# Patient Record
Sex: Male | Born: 1955
Health system: Southern US, Community
[De-identification: ages and names within clinical notes are randomized; demographics above are authoritative.]

## PROBLEM LIST (undated history)

## (undated) DIAGNOSIS — N289 Disorder of kidney and ureter, unspecified: Secondary | ICD-10-CM

## (undated) DIAGNOSIS — Z974 Presence of external hearing-aid: Secondary | ICD-10-CM

## (undated) DIAGNOSIS — Z87442 Personal history of urinary calculi: Secondary | ICD-10-CM

## (undated) DIAGNOSIS — M545 Low back pain, unspecified: Secondary | ICD-10-CM

## (undated) DIAGNOSIS — I10 Essential (primary) hypertension: Secondary | ICD-10-CM

## (undated) DIAGNOSIS — G8929 Other chronic pain: Secondary | ICD-10-CM

## (undated) DIAGNOSIS — D649 Anemia, unspecified: Secondary | ICD-10-CM

## (undated) DIAGNOSIS — Z9289 Personal history of other medical treatment: Secondary | ICD-10-CM

## (undated) DIAGNOSIS — Z992 Dependence on renal dialysis: Secondary | ICD-10-CM

## (undated) DIAGNOSIS — F1010 Alcohol abuse, uncomplicated: Secondary | ICD-10-CM

## (undated) DIAGNOSIS — B1920 Unspecified viral hepatitis C without hepatic coma: Secondary | ICD-10-CM

## (undated) DIAGNOSIS — N186 End stage renal disease: Secondary | ICD-10-CM

## (undated) DIAGNOSIS — Z8719 Personal history of other diseases of the digestive system: Secondary | ICD-10-CM

## (undated) DIAGNOSIS — K4090 Unilateral inguinal hernia, without obstruction or gangrene, not specified as recurrent: Secondary | ICD-10-CM

## (undated) DIAGNOSIS — I739 Peripheral vascular disease, unspecified: Secondary | ICD-10-CM

## (undated) DIAGNOSIS — Z973 Presence of spectacles and contact lenses: Secondary | ICD-10-CM

## (undated) DIAGNOSIS — F1411 Cocaine abuse, in remission: Secondary | ICD-10-CM

## (undated) DIAGNOSIS — C801 Malignant (primary) neoplasm, unspecified: Secondary | ICD-10-CM

## (undated) DIAGNOSIS — I2699 Other pulmonary embolism without acute cor pulmonale: Secondary | ICD-10-CM

## (undated) HISTORY — DX: Peripheral vascular disease, unspecified: I73.9

## (undated) HISTORY — PX: EXTRACORPOREAL SHOCK WAVE LITHOTRIPSY: SHX1557

## (undated) HISTORY — DX: Anemia, unspecified: D64.9

## (undated) HISTORY — PX: ABDOMINAL HERNIA REPAIR: SHX539

## (undated) HISTORY — PX: ESOPHAGOGASTRODUODENOSCOPY: SHX1529

## (undated) HISTORY — PX: AV FISTULA REPAIR: SHX563

## (undated) HISTORY — PX: COLONOSCOPY: SHX174

## (undated) HISTORY — PX: HERNIA REPAIR: SHX51

---
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
## 1997-05-28 HISTORY — PX: CORONARY ANGIOPLASTY WITH STENT PLACEMENT: SHX49

## 1998-05-28 HISTORY — PX: ABDOMINAL HERNIA REPAIR: SHX539

## 2005-05-28 HISTORY — PX: AV FISTULA PLACEMENT: SHX1204

## 2008-05-06 DIAGNOSIS — J309 Allergic rhinitis, unspecified: Secondary | ICD-10-CM | POA: Insufficient documentation

## 2008-12-21 ENCOUNTER — Encounter: Admission: RE | Admit: 2008-12-21 | Discharge: 2008-12-21 | Payer: Self-pay | Admitting: Nephrology

## 2009-01-20 ENCOUNTER — Encounter: Admission: RE | Admit: 2009-01-20 | Discharge: 2009-01-20 | Payer: Self-pay | Admitting: Nephrology

## 2009-01-20 ENCOUNTER — Encounter (INDEPENDENT_AMBULATORY_CARE_PROVIDER_SITE_OTHER): Payer: Self-pay | Admitting: *Deleted

## 2009-02-02 ENCOUNTER — Ambulatory Visit (HOSPITAL_COMMUNITY): Admission: RE | Admit: 2009-02-02 | Discharge: 2009-02-02 | Payer: Self-pay | Admitting: Nephrology

## 2009-02-03 ENCOUNTER — Ambulatory Visit: Payer: Self-pay | Admitting: Surgery

## 2009-02-16 DIAGNOSIS — N186 End stage renal disease: Secondary | ICD-10-CM | POA: Insufficient documentation

## 2009-02-16 DIAGNOSIS — Z992 Dependence on renal dialysis: Secondary | ICD-10-CM

## 2009-02-16 HISTORY — DX: End stage renal disease: N18.6

## 2009-02-23 ENCOUNTER — Ambulatory Visit: Payer: Self-pay | Admitting: Internal Medicine

## 2009-02-23 DIAGNOSIS — I2510 Atherosclerotic heart disease of native coronary artery without angina pectoris: Secondary | ICD-10-CM | POA: Insufficient documentation

## 2009-02-23 DIAGNOSIS — K648 Other hemorrhoids: Secondary | ICD-10-CM | POA: Insufficient documentation

## 2009-02-23 DIAGNOSIS — K219 Gastro-esophageal reflux disease without esophagitis: Secondary | ICD-10-CM | POA: Insufficient documentation

## 2009-02-23 DIAGNOSIS — R1084 Generalized abdominal pain: Secondary | ICD-10-CM | POA: Insufficient documentation

## 2009-03-03 ENCOUNTER — Encounter: Payer: Self-pay | Admitting: Internal Medicine

## 2009-03-18 ENCOUNTER — Ambulatory Visit (HOSPITAL_COMMUNITY): Admission: RE | Admit: 2009-03-18 | Discharge: 2009-03-18 | Payer: Self-pay | Admitting: Internal Medicine

## 2009-03-18 ENCOUNTER — Ambulatory Visit: Payer: Self-pay | Admitting: Internal Medicine

## 2009-03-18 ENCOUNTER — Encounter: Payer: Self-pay | Admitting: Internal Medicine

## 2009-03-21 ENCOUNTER — Encounter: Payer: Self-pay | Admitting: Internal Medicine

## 2009-03-30 ENCOUNTER — Encounter
Admission: RE | Admit: 2009-03-30 | Discharge: 2009-05-19 | Payer: Self-pay | Admitting: Physical Medicine & Rehabilitation

## 2009-04-01 ENCOUNTER — Ambulatory Visit: Payer: Self-pay | Admitting: Physical Medicine & Rehabilitation

## 2009-06-06 ENCOUNTER — Telehealth: Payer: Self-pay | Admitting: Internal Medicine

## 2009-11-07 ENCOUNTER — Inpatient Hospital Stay (HOSPITAL_COMMUNITY)
Admission: EM | Admit: 2009-11-07 | Discharge: 2009-11-07 | Payer: Self-pay | Source: Home / Self Care | Admitting: Emergency Medicine

## 2010-03-04 ENCOUNTER — Emergency Department (HOSPITAL_COMMUNITY): Admission: EM | Admit: 2010-03-04 | Discharge: 2010-03-04 | Payer: Self-pay | Admitting: Emergency Medicine

## 2010-03-21 ENCOUNTER — Emergency Department (HOSPITAL_COMMUNITY): Admission: EM | Admit: 2010-03-21 | Discharge: 2010-03-21 | Payer: Self-pay | Admitting: Emergency Medicine

## 2010-04-13 ENCOUNTER — Emergency Department (HOSPITAL_COMMUNITY): Admission: EM | Admit: 2010-04-13 | Discharge: 2010-04-13 | Payer: Self-pay | Admitting: Emergency Medicine

## 2010-04-26 DIAGNOSIS — N19 Unspecified kidney failure: Secondary | ICD-10-CM | POA: Insufficient documentation

## 2010-06-08 DIAGNOSIS — G894 Chronic pain syndrome: Secondary | ICD-10-CM | POA: Insufficient documentation

## 2010-06-17 ENCOUNTER — Encounter: Payer: Self-pay | Admitting: Orthopedic Surgery

## 2010-06-18 ENCOUNTER — Encounter: Payer: Self-pay | Admitting: Physical Medicine & Rehabilitation

## 2010-06-20 ENCOUNTER — Ambulatory Visit (HOSPITAL_COMMUNITY)
Admission: RE | Admit: 2010-06-20 | Discharge: 2010-06-20 | Payer: Self-pay | Source: Home / Self Care | Attending: Nephrology | Admitting: Nephrology

## 2010-06-27 NOTE — Progress Notes (Signed)
Summary: results?  Phone Note Call from Patient Call back at [phone]   Caller: Patient Call For: Dr. Henrene Pastor Reason for Call: Lab or Test Results Summary of Call: pt want to know [facility] not [facility] the results from his ECL... says he is waiting on these results to have a transplant Initial call taken by: Lucien Mons,  June 06, 2009 10:37 AM  Follow-up for Phone Call        Explained that procedures and path. reports were sent to [facility]. immediatley post procedures who referred him here and is co-ordinating transplant work up and he should contact her office.I also told him I would be most willing to fax info. if he got me the number but it would be more advantagious to contact her as she is co-ordinating the whole pre-transplant work up to be done in [place]. Follow-up by: Abel Presto RN,  June 06, 2009 11:10 AM

## 2010-06-28 DIAGNOSIS — I2699 Other pulmonary embolism without acute cor pulmonale: Secondary | ICD-10-CM

## 2010-06-28 HISTORY — DX: Other pulmonary embolism without acute cor pulmonale: I26.99

## 2010-07-20 ENCOUNTER — Encounter (HOSPITAL_COMMUNITY): Payer: Self-pay | Admitting: Radiology

## 2010-07-20 ENCOUNTER — Emergency Department (HOSPITAL_COMMUNITY): Payer: Medicare Other

## 2010-07-20 ENCOUNTER — Inpatient Hospital Stay (HOSPITAL_COMMUNITY)
Admission: EM | Admit: 2010-07-20 | Discharge: 2010-07-27 | DRG: 175 | Disposition: A | Discharge status: Home / Self Care | Payer: Medicare Other | Attending: Internal Medicine | Admitting: Internal Medicine

## 2010-07-20 DIAGNOSIS — Z79899 Other long term (current) drug therapy: Secondary | ICD-10-CM

## 2010-07-20 DIAGNOSIS — D631 Anemia in chronic kidney disease: Secondary | ICD-10-CM | POA: Diagnosis present

## 2010-07-20 DIAGNOSIS — I120 Hypertensive chronic kidney disease with stage 5 chronic kidney disease or end stage renal disease: Secondary | ICD-10-CM | POA: Diagnosis present

## 2010-07-20 DIAGNOSIS — I2699 Other pulmonary embolism without acute cor pulmonale: Principal | ICD-10-CM | POA: Diagnosis present

## 2010-07-20 DIAGNOSIS — G894 Chronic pain syndrome: Secondary | ICD-10-CM | POA: Diagnosis present

## 2010-07-20 DIAGNOSIS — Z992 Dependence on renal dialysis: Secondary | ICD-10-CM

## 2010-07-20 DIAGNOSIS — Z531 Procedure and treatment not carried out because of patient's decision for reasons of belief and group pressure: Secondary | ICD-10-CM

## 2010-07-20 DIAGNOSIS — N186 End stage renal disease: Secondary | ICD-10-CM | POA: Diagnosis present

## 2010-07-20 DIAGNOSIS — Z7902 Long term (current) use of antithrombotics/antiplatelets: Secondary | ICD-10-CM

## 2010-07-20 DIAGNOSIS — K219 Gastro-esophageal reflux disease without esophagitis: Secondary | ICD-10-CM | POA: Diagnosis present

## 2010-07-20 DIAGNOSIS — Z9861 Coronary angioplasty status: Secondary | ICD-10-CM

## 2010-07-20 DIAGNOSIS — I252 Old myocardial infarction: Secondary | ICD-10-CM

## 2010-07-20 DIAGNOSIS — N039 Chronic nephritic syndrome with unspecified morphologic changes: Secondary | ICD-10-CM | POA: Diagnosis present

## 2010-07-20 DIAGNOSIS — E876 Hypokalemia: Secondary | ICD-10-CM | POA: Diagnosis present

## 2010-07-20 DIAGNOSIS — I2510 Atherosclerotic heart disease of native coronary artery without angina pectoris: Secondary | ICD-10-CM | POA: Diagnosis present

## 2010-07-20 DIAGNOSIS — N2581 Secondary hyperparathyroidism of renal origin: Secondary | ICD-10-CM | POA: Diagnosis present

## 2010-07-20 DIAGNOSIS — B182 Chronic viral hepatitis C: Secondary | ICD-10-CM | POA: Diagnosis present

## 2010-07-20 HISTORY — DX: Disorder of kidney and ureter, unspecified: N28.9

## 2010-07-20 LAB — CK TOTAL AND CKMB (NOT AT ARMC)
CK, MB: 1 ng/mL (ref 0.3–4.0)
Relative Index: 0.7 (ref 0.0–2.5)
Total CK: 136 U/L (ref 7–232)

## 2010-07-20 LAB — DIFFERENTIAL
Basophils Absolute: 0 10*3/uL (ref 0.0–0.1)
Eosinophils Absolute: 0.1 10*3/uL (ref 0.0–0.7)
Eosinophils Relative: 3 % (ref 0–5)
Lymphocytes Relative: 42 % (ref 12–46)
Lymphs Abs: 1.8 10*3/uL (ref 0.7–4.0)
Monocytes Absolute: 0.5 10*3/uL (ref 0.1–1.0)
Monocytes Relative: 12 % (ref 3–12)

## 2010-07-20 LAB — POCT CARDIAC MARKERS
CKMB, poc: <1 ng/mL — ABNORMAL LOW (ref 1.0–8.0)
Myoglobin, poc: 394 ng/mL (ref 12–200)
Troponin i, poc: <0.05 ng/mL (ref 0.00–0.09)

## 2010-07-20 LAB — BASIC METABOLIC PANEL
BUN: 16 mg/dL (ref 6–23)
CO2: 31 mEq/L (ref 19–32)
Calcium: 8.8 mg/dL (ref 8.4–10.5)
Chloride: 100 mEq/L (ref 96–112)
Creatinine, Ser: 7.11 mg/dL — ABNORMAL HIGH (ref 0.4–1.5)
GFR calc Af Amer: 10 mL/min — ABNORMAL LOW (ref >60)
GFR calc non Af Amer: 8 mL/min — ABNORMAL LOW (ref >60)
Glucose, Bld: 85 mg/dL (ref 70–99)
Sodium: 142 mEq/L (ref 135–145)

## 2010-07-20 LAB — CBC
HCT: 42.4 % (ref 39.0–52.0)
MCH: 31.6 pg (ref 26.0–34.0)
MCV: 95.7 fL (ref 78.0–100.0)
Platelets: 149 10*3/uL — ABNORMAL LOW (ref 150–400)
RBC: 4.43 MIL/uL (ref 4.22–5.81)
RDW: 14.6 % (ref 11.5–15.5)

## 2010-07-20 LAB — PHOSPHORUS: Phosphorus: 2.1 mg/dL — ABNORMAL LOW (ref 2.3–4.6)

## 2010-07-20 LAB — MAGNESIUM: Magnesium: 2 mg/dL (ref 1.5–2.5)

## 2010-07-20 LAB — TROPONIN I: Troponin I: 0.01 ng/mL (ref 0.00–0.06)

## 2010-07-20 LAB — PROTIME-INR: Prothrombin Time: 13.2 seconds (ref 11.6–15.2)

## 2010-07-20 MED ORDER — IOHEXOL 300 MG/ML  SOLN
100.0000 mL | Freq: Once | INTRAMUSCULAR | Status: AC | PRN
  Administered 2010-07-20: 100 mL via INTRAVENOUS

## 2010-07-21 LAB — TROPONIN I
Troponin I: 0.01 ng/mL (ref 0.00–0.06)
Troponin I: 0.02 ng/mL (ref 0.00–0.06)

## 2010-07-21 LAB — CK TOTAL AND CKMB (NOT AT ARMC)
CK, MB: 0.9 ng/mL (ref 0.3–4.0)
Relative Index: 0.6 (ref 0.0–2.5)
Relative Index: 0.7 (ref 0.0–2.5)
Total CK: 127 U/L (ref 7–232)
Total CK: 142 U/L (ref 7–232)

## 2010-07-21 LAB — BASIC METABOLIC PANEL
BUN: 23 mg/dL (ref 6–23)
CO2: 29 mEq/L (ref 19–32)
Chloride: 98 mEq/L (ref 96–112)
Creatinine, Ser: 8.82 mg/dL — ABNORMAL HIGH (ref 0.4–1.5)
GFR calc Af Amer: 8 mL/min — ABNORMAL LOW (ref >60)
Potassium: 3.5 mEq/L (ref 3.5–5.1)

## 2010-07-21 LAB — TSH: TSH: 0.985 u[IU]/mL (ref 0.350–4.500)

## 2010-07-21 LAB — CBC
HCT: 38.7 % — ABNORMAL LOW (ref 39.0–52.0)
MCH: 32 pg (ref 26.0–34.0)
MCV: 97.5 fL (ref 78.0–100.0)
Platelets: 145 10*3/uL — ABNORMAL LOW (ref 150–400)
RDW: 14.8 % (ref 11.5–15.5)

## 2010-07-21 LAB — HEPATIC FUNCTION PANEL
AST: 14 U/L (ref 0–37)
Albumin: 3.2 g/dL — ABNORMAL LOW (ref 3.5–5.2)
Total Bilirubin: 0.5 mg/dL (ref 0.3–1.2)
Total Protein: 6.4 g/dL (ref 6.0–8.3)

## 2010-07-22 ENCOUNTER — Inpatient Hospital Stay (HOSPITAL_COMMUNITY): Payer: Medicare Other

## 2010-07-22 LAB — CBC
HCT: 38.1 % — ABNORMAL LOW (ref 39.0–52.0)
Hemoglobin: 12.4 g/dL — ABNORMAL LOW (ref 13.0–17.0)
MCH: 31.5 pg (ref 26.0–34.0)
MCHC: 32.5 g/dL (ref 30.0–36.0)
MCV: 96.7 fL (ref 78.0–100.0)
RDW: 14.9 % (ref 11.5–15.5)

## 2010-07-22 LAB — RENAL FUNCTION PANEL
BUN: 31 mg/dL — ABNORMAL HIGH (ref 6–23)
CO2: 25 mEq/L (ref 19–32)
CO2: 25 mEq/L (ref 19–32)
Calcium: 7.1 mg/dL — ABNORMAL LOW (ref 8.4–10.5)
Calcium: 7.1 mg/dL — ABNORMAL LOW (ref 8.4–10.5)
Creatinine, Ser: 11.83 mg/dL — ABNORMAL HIGH (ref 0.4–1.5)
GFR calc Af Amer: 5 mL/min — ABNORMAL LOW (ref >60)
GFR calc non Af Amer: 4 mL/min — ABNORMAL LOW (ref >60)
Glucose, Bld: 84 mg/dL (ref 70–99)
Glucose, Bld: 134 mg/dL — ABNORMAL HIGH (ref 70–99)
Phosphorus: 2.6 mg/dL (ref 2.3–4.6)
Potassium: 3.7 mEq/L (ref 3.5–5.1)
Sodium: 138 mEq/L (ref 135–145)
Sodium: 136 mEq/L (ref 135–145)

## 2010-07-22 LAB — PROTIME-INR: Prothrombin Time: 20.7 seconds — ABNORMAL HIGH (ref 11.6–15.2)

## 2010-07-23 LAB — CBC
Hemoglobin: 12.6 g/dL — ABNORMAL LOW (ref 13.0–17.0)
MCH: 32 pg (ref 26.0–34.0)
MCHC: 32.9 g/dL (ref 30.0–36.0)
Platelets: 115 10*3/uL — ABNORMAL LOW (ref 150–400)
RBC: 3.94 MIL/uL — ABNORMAL LOW (ref 4.22–5.81)

## 2010-07-23 LAB — PROTIME-INR
INR: 2.7 — ABNORMAL HIGH (ref 0.00–1.49)
Prothrombin Time: 28.8 seconds — ABNORMAL HIGH (ref 11.6–15.2)

## 2010-07-23 LAB — APTT: aPTT: 92 seconds — ABNORMAL HIGH (ref 24–37)

## 2010-07-23 LAB — HOMOCYSTEINE: Homocysteine: 23.5 umol/L — ABNORMAL HIGH (ref 4.0–15.4)

## 2010-07-24 DIAGNOSIS — R0989 Other specified symptoms and signs involving the circulatory and respiratory systems: Secondary | ICD-10-CM

## 2010-07-24 LAB — APTT
aPTT: 98 seconds — ABNORMAL HIGH (ref 24–37)
aPTT: 103 seconds — ABNORMAL HIGH (ref 24–37)
aPTT: 115 seconds — ABNORMAL HIGH (ref 24–37)

## 2010-07-24 LAB — PROTIME-INR
INR: 3.11 — ABNORMAL HIGH (ref 0.00–1.49)
Prothrombin Time: 32.1 seconds — ABNORMAL HIGH (ref 11.6–15.2)

## 2010-07-24 LAB — LUPUS ANTICOAGULANT PANEL
DRVVT: 108.2 secs — ABNORMAL HIGH (ref 36.2–44.3)
Drvvt confirmation: 1.2 Ratio (ref <1.21)
PTT Lupus Anticoagulant: 96.7 secs — ABNORMAL HIGH (ref 30.0–45.6)
dRVVT Incubated 1:1 Mix: 59.3 secs — ABNORMAL HIGH (ref 36.2–44.3)

## 2010-07-24 LAB — CBC
HCT: 39.1 % (ref 39.0–52.0)
MCHC: 32.2 g/dL (ref 30.0–36.0)
MCV: 98.5 fL (ref 78.0–100.0)
Platelets: 113 10*3/uL — ABNORMAL LOW (ref 150–400)
RDW: 15 % (ref 11.5–15.5)

## 2010-07-24 LAB — ANTITHROMBIN III: AntiThromb III Func: 106 % (ref 76–126)

## 2010-07-24 LAB — FACTOR 5 LEIDEN

## 2010-07-25 ENCOUNTER — Inpatient Hospital Stay (HOSPITAL_COMMUNITY): Payer: Medicare Other

## 2010-07-25 LAB — PROTIME-INR: Prothrombin Time: 36.9 seconds — ABNORMAL HIGH (ref 11.6–15.2)

## 2010-07-25 LAB — CBC
MCH: 31.3 pg (ref 26.0–34.0)
MCHC: 32.7 g/dL (ref 30.0–36.0)
MCV: 95.7 fL (ref 78.0–100.0)
Platelets: 109 10*3/uL — ABNORMAL LOW (ref 150–400)
RBC: 3.51 MIL/uL — ABNORMAL LOW (ref 4.22–5.81)

## 2010-07-25 LAB — RENAL FUNCTION PANEL
CO2: 24 mEq/L (ref 19–32)
Chloride: 97 mEq/L (ref 96–112)
Creatinine, Ser: 14.69 mg/dL — ABNORMAL HIGH (ref 0.4–1.5)
GFR calc Af Amer: 4 mL/min — ABNORMAL LOW (ref >60)
GFR calc non Af Amer: 4 mL/min — ABNORMAL LOW (ref >60)
Glucose, Bld: 85 mg/dL (ref 70–99)
Sodium: 136 mEq/L (ref 135–145)

## 2010-07-25 LAB — APTT: aPTT: 80 seconds — ABNORMAL HIGH (ref 24–37)

## 2010-07-26 LAB — CBC
HCT: 35.1 % — ABNORMAL LOW (ref 39.0–52.0)
Hemoglobin: 11.7 g/dL — ABNORMAL LOW (ref 13.0–17.0)
MCH: 32.3 pg (ref 26.0–34.0)
MCHC: 33.3 g/dL (ref 30.0–36.0)
RDW: 15.1 % (ref 11.5–15.5)

## 2010-07-26 LAB — BETA-2-GLYCOPROTEIN I ABS, IGG/M/A
Beta-2-Glycoprotein I IgA: 10 A Units (ref <20)
Beta-2-Glycoprotein I IgM: 13 M Units (ref <20)

## 2010-07-26 LAB — CARDIOLIPIN ANTIBODIES, IGG, IGM, IGA: Anticardiolipin IgA: 14 APL U/mL (ref <22)

## 2010-07-26 NOTE — Discharge Summary (Signed)
NAME:  Logan Martin, Logan Martin                ACCOUNT NO.:  [id number]  MEDICAL RECORD NO.:  [id number]           PATIENT TYPE:  I  LOCATION:  [id number]                         FACILITY:  [facility]  PHYSICIAN:  Lottie Dawson, MD       DATE OF BIRTH:  [birth year]  DATE OF ADMISSION:  07/20/2010 DATE OF DISCHARGE:                              DISCHARGE SUMMARY   PATIENT'S PRIMARY CARE PHYSICIAN:  Does not have one.  PATIENT'S NEPHROLOGIST:  [facility].  DISCHARGE DIAGNOSES: 1. Right lower lobe pulmonary embolism. 2. End-stage renal disease. 3. History of coronary artery disease. 4. Hypertension. 5. History of gastroesophageal reflux disease. 6. History of hepatitis C. 7. Hyperkalemia. 8. Anemia of chronic disease. 9. History of secondary hyperparathyroidism. 10.History of pancreatitis. [id number] 1 diastolic dysfunction per Per 2-D echo.  DISCHARGE MEDICATIONS:  To be reconciled at the time of discharge.  BRIEF ADMITTING HISTORY AND PHYSICAL:  Logan Martin is a 55 year old African American male, who is on hemodialysis due to end-stage renal disease presented on July 20, 2010, with complaints about midsternal chest pain.  RADIOLOGY/IMAGING: 1. The patient had chest x-ray two-view on July 20, 2010, which     shows no acute disease. 2. The patient had a CT of the chest with contrast on July 20, 2010, which showed solitary right lower lobe segmental pulmonary     embolism.  Coronary calcifications noted bilateral renal and     hepatic cysts. 3. The patient had lower extremity Dopplers on July 24, 2010,     which shows no evidence of DVT or superficial thrombus involving     the right lower extremity and left lower extremity.  No evidence of     Baker cyst on right or left.  LABORATORY DATA:  CBC shows a white count of 3.0, hemoglobin 11.0, hematocrit 33.6 and platelet count 109.  Electrolytes normal with BUN of 45, creatinine is 14.69.  Lupus anticoagulant panel was done  and was positive.  Liver function tests normal except albumin is 3.3, calcium is 6.9.  Total protein was normal.  Troponin was negative x3.  TSH was 0.985  The patient had a 2-D echocardiogram on July 24, 2010, which showed left ventricle cavity size was normal, had mild concentric hypertrophy. His systolic function was normal.  Ejection fraction was 60-65%.  Wall motion was normal.  There were no regional wall motion abnormalities. Doppler parameters showed consistent with abnormal left ventricular relaxation (grade 1 diastolic dysfunction).  The patient's mitral valve showed calcified annulus.  Left atrium was mildly dilated.  HOSPITAL COURSE BY PROBLEM: 1. Right lung PE.  The patient was started on Coumadin and argatroban.     The patient was not started on heparin due to religious reasons.  A     2-D echocardiogram and lower extremity Dopplers were done.     Hypercoagulability workup was done which showed lupus     anticoagulant, was positive in the setting of acute PE Coumadin and     argatroban. The patient's INR is yet to be therapeutic off     of argatroban. 2.  End-stage renal disease on hemodialysis.  Continue as per Renal. 3. Hypertension, stable.  Continue with home medications. 4. History of hepatitis C, stable. 5. History of coronary artery disease, stable. 6. Anemia likely due to chronic disease from end-stage renal disease,     stable.  DISPOSITION:  Pending.  Once INR is greater than 4.0 on argatroban, we will discontinue argatroban and recheck his PT/INR 4 hours later off of argatroban and if the INR is greater than 2.0, consider discharging him at that time.   Lottie Dawson, MD   SR/MEDQ  D:  07/25/2010  T:  07/26/2010  Job:  [id number]  Electronically Signed by Alveta Heimlich Tionne Dayhoff  on 07/26/2010 08:05:19 AM

## 2010-07-27 ENCOUNTER — Inpatient Hospital Stay (HOSPITAL_COMMUNITY): Payer: Medicare Other

## 2010-07-27 LAB — CBC
HCT: 32.6 % — ABNORMAL LOW (ref 39.0–52.0)
Hemoglobin: 10.6 g/dL — ABNORMAL LOW (ref 13.0–17.0)
RBC: 3.44 MIL/uL — ABNORMAL LOW (ref 4.22–5.81)
RDW: 14.9 % (ref 11.5–15.5)
WBC: 2.6 10*3/uL — ABNORMAL LOW (ref 4.0–10.5)

## 2010-07-27 LAB — RENAL FUNCTION PANEL
CO2: 25 mEq/L (ref 19–32)
Chloride: 102 mEq/L (ref 96–112)
GFR calc Af Amer: 5 mL/min — ABNORMAL LOW (ref >60)
GFR calc non Af Amer: 4 mL/min — ABNORMAL LOW (ref >60)
Potassium: 4.4 mEq/L (ref 3.5–5.1)
Sodium: 137 mEq/L (ref 135–145)

## 2010-07-27 LAB — PROTIME-INR
INR: 6.72 (ref 0.00–1.49)
INR: 5.07 (ref 0.00–1.49)
Prothrombin Time: 46.7 seconds — ABNORMAL HIGH (ref 11.6–15.2)

## 2010-07-27 LAB — APTT: aPTT: 101 seconds — ABNORMAL HIGH (ref 24–37)

## 2010-07-27 LAB — PROTEIN C, TOTAL: Protein C, Total: 62 % — ABNORMAL LOW (ref 70–140)

## 2010-08-02 ENCOUNTER — Emergency Department (HOSPITAL_COMMUNITY)
Admission: EM | Admit: 2010-08-02 | Discharge: 2010-08-02 | Discharge status: Against Medical Advice | Payer: Medicare Other | Attending: Emergency Medicine | Admitting: Emergency Medicine

## 2010-08-02 DIAGNOSIS — Y849 Medical procedure, unspecified as the cause of abnormal reaction of the patient, or of later complication, without mention of misadventure at the time of the procedure: Secondary | ICD-10-CM | POA: Insufficient documentation

## 2010-08-02 DIAGNOSIS — T82898A Other specified complication of vascular prosthetic devices, implants and grafts, initial encounter: Secondary | ICD-10-CM | POA: Insufficient documentation

## 2010-08-03 NOTE — H&P (Signed)
NAME:  Logan Martin, Logan Martin                ACCOUNT NO.:  [id number]  MEDICAL RECORD NO.:  [id number]           PATIENT TYPE:  E  LOCATION:  [facility]                         FACILITY:  [facility]  PHYSICIAN:  Barbette Merino, M.D.      DATE OF BIRTH:  [birth year]  DATE OF ADMISSION:  07/20/2010 DATE OF DISCHARGE:                             HISTORY & PHYSICAL   PRIMARY CARE PHYSICIAN:  He is unassigned to [place].  PRESENTING COMPLAINT:  Chest pain and shortness of breath.  HISTORY OF PRESENT ILLNESS:  The patient is a 55 year old African American male who is on hemodialysis due to end-stage renal disease.  He has apparently been doing all right until today when he started experiencing midsternal chest pain.  He started after his hemodialysis. It was associated with some shortness of breath.  The pain is worse with deep inspiration.  He denied any nausea or diaphoresis.  He has recently had his catheter tunneled apparently.  He is very active otherwise.  No recent surgery.  He has no known cardiac disease.  PAST MEDICAL HISTORY:  Significant for, 1. End-stage renal disease, currently on hemodialysis on Tuesdays,     Thursdays, and Saturdays. 2. History of coronary artery disease. 3. GERD. 4. History of hepatitis C. 5. Hypertension. 6. Anemia of chronic disease. 7. History of secondary hyperparathyroidism. 8. History of pancreatitis.  ALLERGIES:  SULFA drugs.  CURRENT MEDICATIONS: 1. Oxycodone/acetaminophen as needed. 2. PhosLo 1 tablet t.i.d. 3. Sensipar daily. 4. Temazepam 30 mg daily. 5. Rena-Vite 1 tablet daily. 6. Omeprazole 20 mg daily. 7. Plavix 75 mg daily.  SOCIAL HISTORY:  The patient used to live in [place], but currently lives here in [place].  He is currently disabled.  Denied tobacco, alcohol, or IV drug use.  FAMILY HISTORY:  Denied any family history of any renal or cardiac disease.  REVIEW OF SYSTEMS:  All systems reviewed are negative except per HPI.  PHYSICAL  EXAMINATION:  VITAL SIGNS:  Temperature 97.5, blood pressure 114/69, with a pulse of 78, respiratory rate 18, O2 sats 99% on room air. GENERAL:  He is awake, alert.  He is in no acute distress. HEENT:  PERRL.  EOMI.  No pallor, no jaundice, no rhinorrhea. NECK:  Supple.  No JVD, no lymphadenopathy. RESPIRATORY:  He has good air entry bilaterally.  No wheezes, no rales, no crackles. CARDIOVASCULAR:  He has S1 and S2.  No audible murmur. ABDOMEN:  Soft, full, nontender with positive bowel sounds. EXTREMITIES:  No edema, cyanosis, or clubbing. SKIN:  No new rashes or ulcers.  LABORATORY DATA:  His white count is 4.3, hemoglobin 14.0 with platelet of 149, and normal differentials.  Initial cardiac enzymes are negative. Sodium is 142, potassium 3.3, chloride 100, CO2 of 31, glucose 85, BUN 16, creatinine 7.11, and calcium 8.8.  CK 136, CK-MB 1.0 with a troponin- I of 0.01.  His BNP is less than 30, magnesium 2.1, phosphorus of 2.1. Chest x-ray showed no active disease.  CT angiogram of the chest showed right lower lobe segmental pulmonary embolus with coronary calcification, bilateral renal and hepatic cysts.  ASSESSMENT:  This is a 55 year old patient with end-stage renal disease presenting with pulmonary embolism.  The patient has made it clear that he does not want any heparin or anything with pork due to religious reasons.  PLAN: 1. Right lower lobe PE.  The cause of his PE is not entirely clear.     He denied any recent surgery of the lung trauma.  It may be related     to his procedure.  There is no known family history of blood clots.     At this point, our goal is to treat his PE with Coumadin.  Since     the patient does not want to use heparin, I have conferred with     pharmacy.  We cannot use Arixtra on him because of his renal     status.  The best option is to use Arixtra in conjunction with     Coumadin.  We have to be very careful, however, because this could      consume the clotting factors and make PT/INR very difficult.  He     will need to be on that for 5 days and in the first couple of days     PT/INR timing should be very strict so that we do not get false     positive or false negative results. 2. End-stage renal disease.  We will notify Nephrology, so the patient     will be on dialysis here. 3. History of coronary artery disease.  We will cycle his enzymes,     although there is no evidence of active coronary artery disease     from his EKG and it seems like his chest pain is entirely just the     PE. 4. Hypertension.  Blood pressure is reasonably controlled.  We will     continue his home medications. 5. Gastroesophageal reflux disease.  I will resume PPIs on the     patient. 6. History of hepatitis C.  This has been worked up apparently in [place]. 7. Hypokalemia.  This will be corrected through his hemodialysis. 8. Anemia of chronic disease.  Apparently the patient has received     Epogen before, so we will defer that to Nephrology for further     treatment.     Barbette Merino, M.D.     [place]  D:  07/21/2010  T:  07/21/2010  Job:  [id number]  Electronically Signed by Barbette Merino M.D. on 08/03/2010 06:33:50 AM

## 2010-08-09 LAB — POCT I-STAT, CHEM 8
BUN: 64 mg/dL — ABNORMAL HIGH (ref 6–23)
Chloride: 103 mEq/L (ref 96–112)
Potassium: 3.7 mEq/L (ref 3.5–5.1)
Sodium: 141 mEq/L (ref 135–145)
TCO2: 28 mmol/L (ref 0–100)
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
## 2010-08-14 LAB — DIFFERENTIAL
Lymphs Abs: 1.5 10*3/uL (ref 0.7–4.0)
Monocytes Absolute: 0.4 10*3/uL (ref 0.1–1.0)
Monocytes Relative: 9 % (ref 3–12)
Neutro Abs: 2.4 10*3/uL (ref 1.7–7.7)
Neutrophils Relative %: 52 % (ref 43–77)

## 2010-08-14 LAB — CBC
HCT: 30.6 % — ABNORMAL LOW (ref 39.0–52.0)
MCV: 101.7 fL — ABNORMAL HIGH (ref 78.0–100.0)
Platelets: 199 10*3/uL (ref 150–400)
RBC: 3.01 MIL/uL — ABNORMAL LOW (ref 4.22–5.81)
RDW: 15.1 % (ref 11.5–15.5)
WBC: 4.6 10*3/uL (ref 4.0–10.5)
WBC: 4.7 10*3/uL (ref 4.0–10.5)

## 2010-08-14 LAB — RENAL FUNCTION PANEL
Albumin: 2.9 g/dL — ABNORMAL LOW (ref 3.5–5.2)
BUN: 63 mg/dL — ABNORMAL HIGH (ref 6–23)
Calcium: 7.6 mg/dL — ABNORMAL LOW (ref 8.4–10.5)
Chloride: 104 mEq/L (ref 96–112)
Creatinine, Ser: 18.84 mg/dL — ABNORMAL HIGH (ref 0.4–1.5)
GFR calc Af Amer: 3 mL/min — ABNORMAL LOW (ref >60)
Phosphorus: 4.7 mg/dL — ABNORMAL HIGH (ref 2.3–4.6)

## 2010-08-14 LAB — COMPREHENSIVE METABOLIC PANEL
AST: 17 U/L (ref 0–37)
Alkaline Phosphatase: 75 U/L (ref 39–117)
CO2: 24 mEq/L (ref 19–32)
Chloride: 103 mEq/L (ref 96–112)
Creatinine, Ser: 19.17 mg/dL — ABNORMAL HIGH (ref 0.4–1.5)
GFR calc Af Amer: 3 mL/min — ABNORMAL LOW (ref >60)

## 2010-08-14 LAB — POCT CARDIAC MARKERS
CKMB, poc: <1 ng/mL — ABNORMAL LOW (ref 1.0–8.0)
Myoglobin, poc: >500 ng/mL (ref 12–200)
Troponin i, poc: <0.05 ng/mL (ref 0.00–0.09)

## 2010-08-14 LAB — HEPATITIS B SURFACE ANTIBODY,QUALITATIVE: Hep B S Ab: POSITIVE — AB

## 2010-08-14 LAB — HEPATITIS B CORE ANTIBODY, TOTAL: Hep B Core Total Ab: NEGATIVE

## 2010-08-14 LAB — ALT: ALT: 16 U/L (ref 0–53)

## 2010-08-14 LAB — HEPATITIS B SURFACE ANTIGEN: Hepatitis B Surface Ag: NEGATIVE

## 2010-08-17 ENCOUNTER — Ambulatory Visit: Payer: Self-pay | Admitting: Gastroenterology

## 2010-10-10 NOTE — Procedures (Signed)
CAROTID DUPLEX EXAM   INDICATION:  Preop renal transplant evaluation.   HISTORY:  Diabetes:  No.  Cardiac:  No.  Hypertension:  No.  Smoking:  Previous.  Previous Surgery:  No.  CV History:  Currently asymptomatic.  Amaurosis Fugax No, Paresthesias No, Hemiparesis No                                       RIGHT             LEFT  Brachial systolic pressure:         AVF               157  Brachial Doppler waveforms:         Normal            Normal  Vertebral direction of flow:        Antegrade         Antegrade  DUPLEX VELOCITIES (cm/sec)  CCA peak systolic                   128               [id number]  ECA peak systolic                   75                58  ICA peak systolic                   57                58  ICA end diastolic                   16                17  PLAQUE MORPHOLOGY:                  Heterogeneous     Heterogeneous  PLAQUE AMOUNT:                      Minimal           Mild  PLAQUE LOCATION:                    CCA               ICA   IMPRESSION:  1. No evidence of stenosis noted in the right internal carotid artery.  2. A 1% to 39% stenosis of the left internal carotid artery.  3. A preliminary report was faxed to Dr. Sanda Klein office on      02/03/2009.   ___________________________________________  V. Leia Alf, MD   CH/MEDQ  D:  02/04/2009  T:  02/04/2009  Job:  [id number]

## 2010-11-27 ENCOUNTER — Emergency Department (HOSPITAL_COMMUNITY): Payer: Medicare Other

## 2010-11-27 ENCOUNTER — Inpatient Hospital Stay (HOSPITAL_COMMUNITY)
Admission: EM | Admit: 2010-11-27 | Discharge: 2010-11-28 | DRG: 698 | Disposition: A | Discharge status: Home / Self Care | Payer: Medicare Other | Attending: Internal Medicine | Admitting: Internal Medicine

## 2010-11-27 DIAGNOSIS — Z882 Allergy status to sulfonamides status: Secondary | ICD-10-CM

## 2010-11-27 DIAGNOSIS — N3289 Other specified disorders of bladder: Principal | ICD-10-CM | POA: Diagnosis present

## 2010-11-27 DIAGNOSIS — N2581 Secondary hyperparathyroidism of renal origin: Secondary | ICD-10-CM | POA: Diagnosis present

## 2010-11-27 DIAGNOSIS — R319 Hematuria, unspecified: Secondary | ICD-10-CM | POA: Diagnosis present

## 2010-11-27 DIAGNOSIS — Z8619 Personal history of other infectious and parasitic diseases: Secondary | ICD-10-CM

## 2010-11-27 DIAGNOSIS — I120 Hypertensive chronic kidney disease with stage 5 chronic kidney disease or end stage renal disease: Secondary | ICD-10-CM | POA: Diagnosis present

## 2010-11-27 DIAGNOSIS — N186 End stage renal disease: Secondary | ICD-10-CM | POA: Diagnosis present

## 2010-11-27 DIAGNOSIS — R339 Retention of urine, unspecified: Secondary | ICD-10-CM | POA: Diagnosis present

## 2010-11-27 DIAGNOSIS — Z86718 Personal history of other venous thrombosis and embolism: Secondary | ICD-10-CM

## 2010-11-27 DIAGNOSIS — Z7901 Long term (current) use of anticoagulants: Secondary | ICD-10-CM

## 2010-11-27 LAB — COMPREHENSIVE METABOLIC PANEL
Albumin: 3.8 g/dL (ref 3.5–5.2)
Alkaline Phosphatase: 164 U/L — ABNORMAL HIGH (ref 39–117)
BUN: 33 mg/dL — ABNORMAL HIGH (ref 6–23)
Creatinine, Ser: 10.92 mg/dL — ABNORMAL HIGH (ref 0.50–1.35)
Potassium: 4.1 mEq/L (ref 3.5–5.1)
Total Protein: 7.2 g/dL (ref 6.0–8.3)

## 2010-11-27 LAB — CBC
HCT: 27.6 % — ABNORMAL LOW (ref 39.0–52.0)
MCHC: 33.3 g/dL (ref 30.0–36.0)
Platelets: 184 10*3/uL (ref 150–400)
RDW: 15.4 % (ref 11.5–15.5)

## 2010-11-27 LAB — DIFFERENTIAL
Basophils Absolute: 0 10*3/uL (ref 0.0–0.1)
Eosinophils Absolute: 0.2 10*3/uL (ref 0.0–0.7)
Eosinophils Relative: 4 % (ref 0–5)
Lymphocytes Relative: 37 % (ref 12–46)
Monocytes Absolute: 0.5 10*3/uL (ref 0.1–1.0)

## 2010-11-27 LAB — LIPASE, BLOOD: Lipase: 30 U/L (ref 11–59)

## 2010-11-27 MED ORDER — IOHEXOL 300 MG/ML  SOLN
100.0000 mL | Freq: Once | INTRAMUSCULAR | Status: AC | PRN
  Administered 2010-11-27: 100 mL via INTRAVENOUS

## 2010-11-28 ENCOUNTER — Inpatient Hospital Stay (HOSPITAL_COMMUNITY)
Admission: RE | Admit: 2010-11-28 | Discharge: 2010-11-28 | Disposition: A | Discharge status: Home / Self Care | Payer: Medicare Other | Source: Ambulatory Visit

## 2010-11-28 LAB — CBC
Platelets: 169 10*3/uL (ref 150–400)
RDW: 15.5 % (ref 11.5–15.5)
WBC: 4 10*3/uL (ref 4.0–10.5)

## 2010-11-28 LAB — PROTIME-INR: Prothrombin Time: 21.8 seconds — ABNORMAL HIGH (ref 11.6–15.2)

## 2010-11-28 LAB — APTT: aPTT: 57 seconds — ABNORMAL HIGH (ref 24–37)

## 2010-11-28 LAB — BASIC METABOLIC PANEL
Chloride: 97 mEq/L (ref 96–112)
GFR calc Af Amer: 6 mL/min — ABNORMAL LOW (ref >60)
Potassium: 4.1 mEq/L (ref 3.5–5.1)
Sodium: 135 mEq/L (ref 135–145)

## 2010-11-28 LAB — MAGNESIUM: Magnesium: 2.1 mg/dL (ref 1.5–2.5)

## 2010-11-30 LAB — CROSSMATCH
ABO/RH(D): O POS
DAT, IgG: NEGATIVE
Unit division: 0
Unit division: 0

## 2010-11-30 LAB — URINE CULTURE

## 2010-12-04 LAB — CULTURE, BLOOD (ROUTINE X 2)
Culture  Setup Time: 201207030859
Culture: NO GROWTH

## 2010-12-05 ENCOUNTER — Other Ambulatory Visit: Payer: Self-pay | Admitting: Urology

## 2010-12-05 ENCOUNTER — Ambulatory Visit (HOSPITAL_COMMUNITY)
Admission: RE | Admit: 2010-12-05 | Discharge: 2010-12-05 | Disposition: A | Discharge status: Home / Self Care | Payer: Medicare Other | Source: Ambulatory Visit | Attending: Urology | Admitting: Urology

## 2010-12-05 ENCOUNTER — Encounter (HOSPITAL_COMMUNITY): Payer: Medicare Other

## 2010-12-05 ENCOUNTER — Other Ambulatory Visit (HOSPITAL_COMMUNITY): Payer: Medicare Other

## 2010-12-05 DIAGNOSIS — Z01818 Encounter for other preprocedural examination: Secondary | ICD-10-CM | POA: Insufficient documentation

## 2010-12-05 DIAGNOSIS — R319 Hematuria, unspecified: Secondary | ICD-10-CM | POA: Insufficient documentation

## 2010-12-05 DIAGNOSIS — Z01812 Encounter for preprocedural laboratory examination: Secondary | ICD-10-CM | POA: Insufficient documentation

## 2010-12-05 LAB — SURGICAL PCR SCREEN
MRSA, PCR: NEGATIVE
Staphylococcus aureus: NEGATIVE

## 2010-12-06 ENCOUNTER — Ambulatory Visit (HOSPITAL_COMMUNITY)
Admission: RE | Admit: 2010-12-06 | Discharge: 2010-12-06 | Disposition: A | Discharge status: Home / Self Care | Payer: Medicare Other | Source: Ambulatory Visit | Attending: Urology | Admitting: Urology

## 2010-12-06 ENCOUNTER — Other Ambulatory Visit: Payer: Self-pay | Admitting: Urology

## 2010-12-06 DIAGNOSIS — Z992 Dependence on renal dialysis: Secondary | ICD-10-CM | POA: Insufficient documentation

## 2010-12-06 DIAGNOSIS — N186 End stage renal disease: Secondary | ICD-10-CM | POA: Insufficient documentation

## 2010-12-06 DIAGNOSIS — N1330 Unspecified hydronephrosis: Secondary | ICD-10-CM | POA: Insufficient documentation

## 2010-12-06 DIAGNOSIS — B1920 Unspecified viral hepatitis C without hepatic coma: Secondary | ICD-10-CM | POA: Insufficient documentation

## 2010-12-06 DIAGNOSIS — Z01812 Encounter for preprocedural laboratory examination: Secondary | ICD-10-CM | POA: Insufficient documentation

## 2010-12-06 DIAGNOSIS — N059 Unspecified nephritic syndrome with unspecified morphologic changes: Secondary | ICD-10-CM | POA: Insufficient documentation

## 2010-12-06 DIAGNOSIS — K219 Gastro-esophageal reflux disease without esophagitis: Secondary | ICD-10-CM | POA: Insufficient documentation

## 2010-12-06 DIAGNOSIS — R310 Gross hematuria: Secondary | ICD-10-CM | POA: Insufficient documentation

## 2010-12-06 DIAGNOSIS — Z01818 Encounter for other preprocedural examination: Secondary | ICD-10-CM | POA: Insufficient documentation

## 2010-12-06 DIAGNOSIS — I2510 Atherosclerotic heart disease of native coronary artery without angina pectoris: Secondary | ICD-10-CM | POA: Insufficient documentation

## 2010-12-06 DIAGNOSIS — I120 Hypertensive chronic kidney disease with stage 5 chronic kidney disease or end stage renal disease: Secondary | ICD-10-CM | POA: Insufficient documentation

## 2010-12-06 LAB — COMPREHENSIVE METABOLIC PANEL
ALT: <5 U/L (ref 0–53)
AST: 11 U/L (ref 0–37)
CO2: 27 mEq/L (ref 19–32)
Calcium: 8.3 mg/dL — ABNORMAL LOW (ref 8.4–10.5)
Creatinine, Ser: 10.27 mg/dL — ABNORMAL HIGH (ref 0.50–1.35)
GFR calc Af Amer: 6 mL/min — ABNORMAL LOW (ref >60)
GFR calc non Af Amer: 5 mL/min — ABNORMAL LOW (ref >60)
Sodium: 138 mEq/L (ref 135–145)
Total Protein: 6.9 g/dL (ref 6.0–8.3)

## 2010-12-06 LAB — APTT: aPTT: 41 seconds — ABNORMAL HIGH (ref 24–37)

## 2010-12-06 LAB — CBC
MCV: 99.1 fL (ref 78.0–100.0)
Platelets: 158 10*3/uL (ref 150–400)
RBC: 3.18 MIL/uL — ABNORMAL LOW (ref 4.22–5.81)
RDW: 16.9 % — ABNORMAL HIGH (ref 11.5–15.5)
WBC: 4.1 10*3/uL (ref 4.0–10.5)

## 2010-12-06 LAB — PROTIME-INR: Prothrombin Time: 14.4 seconds (ref 11.6–15.2)

## 2010-12-06 NOTE — Op Note (Signed)
NAMEMarland Kitchen  Logan Martin, Logan Martin NO.:  [id number]  MEDICAL RECORD NO.:  [id number]  LOCATION:  [id number]                         FACILITY:  [facility]  PHYSICIAN:  Marshall Cork. Jeffie Pollock, M.D.    DATE OF BIRTH:  1956/01/14  DATE OF PROCEDURE:  11/27/2010 DATE OF DISCHARGE:                              OPERATIVE REPORT   PLACE OF SERVICE:  Emergency room.  PROCEDURE:  Cystoscopy with irrigation and evacuation of clots.  PREOPERATIVE DIAGNOSIS:  Urinary retention with possible traumatic catheterization.  POSTOPERATIVE DIAGNOSIS:  Urinary retention with possible traumatic catheterization with bladder filled with old blood.  SURGEON:  Marshall Cork. Jeffie Pollock, MD.  ANESTHESIA:  Local.  SPECIMEN:  Urine culture.  DRAIN:  A 20-French Council catheter.  COMPLICATIONS:  None.  INDICATIONS:  Mr. Round is a 55 year old black male with reflux nephropathy and end-stage renal disease, who has had an augmentation cystoplasty and previously managed his bladder with intermittent catheter.  He did not cath since 2007.  He presented to the emergency room today with the onset yesterday of increasing abdominal pain.  He was having nausea and vomiting when I saw him.  A CT scan revealed a markedly distended bladder.  An attempt to pass a catheter in the emergency room was felt to be traumatic and was aborted, and I was asked to see the patient.  FINDINGS OF THE PROCEDURE:  Once the patient had been clinically assessed, I felt a cystoscopy was indicated for attempted Foley placement.  He was left supine in the emergency room stretcher.  His genitalia was prepped with Betadine solution.  He was draped with sterile towels.  His urethra was instilled with 10 mL of 2% lidocaine jelly.  The 16-French flexible scope was inserted.  This examination revealed a normal urethra.  The external sphincter was intact.  The prostatic urethra had some slight ruddiness with a small clot adherent in the midportion of the  prostatic urethra.  The prostate urethra appeared more fibrotic than anything else.  The scope was inserted into the bladder, but visualization was impossible due to bloody urine.  A guidewire was passed through the scope into the bladder.  The scope was removed and a 20-French Council catheter was inserted over the wire into the bladder without difficulty.  The balloon was filled with 10 mL of sterile fluid and the wire was removed.  Immediately, there was drainage of some old motor oil-type blood from the bladder.  I then aspirated approximately 1100-1200 mL of the thick motor 41 old blood that had very little in the way of formed clots.  Once the bladder had been evacuated completely, I irrigated with a liter of saline using a Toomey syringe.  Initially, some small stringy clots were withdrawn, but eventually, the irrigant became light pink.  At this point, the catheter was placed to straight drainage and the procedure was terminated.  There were no complications.  He will need repeat cystoscopy in the OR at a later date to assess the bladder for the source of bleeding once he has been medically stabilized.     Marshall Cork. Jeffie Pollock, M.D.     JJW/MEDQ  D:  11/28/2010  T:  11/28/2010  Job:  [id number]  Electronically Signed by Irine Seal M.D. on 12/06/2010 08:40:20 AM

## 2010-12-06 NOTE — Consult Note (Signed)
NAMEMarland Kitchen  MANDY, ENGLIN NO.:  [id number]  MEDICAL RECORD NO.:  [id number]  LOCATION:  [id number]                         FACILITY:  [facility]  PHYSICIAN:  Marshall Cork. Jeffie Pollock, M.D.    DATE OF BIRTH:  1956/04/01  DATE OF CONSULTATION:  11/27/2010 DATE OF DISCHARGE:                                CONSULTATION   CHIEF COMPLAINT:  Abdominal pain.  HISTORY:  Mr. Dixon is a 55 year old black male with a complicated urologic history.  He had a neurogenic bladder with vesicoureteral reflux and was treated with an augmentation cystoplasty and had been on intermittent cath for many years.  He developed end-stage renal disease and he is currently anuric, on dialysis.  He was seen in the emergency room tonight for worsening abdominal pain that began yesterday, it is worse today.  In my presence, he was having nausea and vomiting.  He had a CT scan which showed a markedly distended bladder with left hydronephrosis which was new since 2010, but on my review of the bladder was also moderately distended in 2010.  He reports that he had cystoscopy done last year in [place] where he is originally from to evaluate some dark brown drainage.  An attempted Foley catheter placement in the emergency room, was felt to be traumatic with return of some fresh blood.  He is on Coumadin for pulmonary embolus in May 2012.  His past history is pertinent for allergies to SULFA.  He is a Muslim and refuses pork products which include heparin.  His home medications include Coumadin, omeprazole, oxycodone, PhosLo, Sensipar.  His medical history is significant as noted above. 1. He is also a coronary artery disease with stenting. 2. Hypertension. 3. Reflux. 4. Hepatitis C.  Surgical history is significant for an augmentation cystoplasty.  He had apparently peritoneal dialysis catheter, prior ventral hernia repair with mesh, A-V fistulas for his hemodialysis, and the cystoscopy noted above.  SOCIAL  HISTORY:  He denies tobacco, alcohol, or drug abuse.  He moved to [place] from [place] in the past 2 years because his daughter lives nearby.  Family history is unremarkable.  On review of systems, he has had the abdominal pain with nausea and vomiting.  He has had no fever or chills.  He had no constipation or diarrhea.  Denies lower extremity edema.  He denies chest pain or shortness of breath.  He is, otherwise, without complaints, and I filled full 12-point review of systems except as above.  PHYSICAL EXAMINATION:  VITAL SIGNS:  His temp is 98.5, pulse 84, blood pressure is 121/69, respirations 18. GENERAL:  This is a well-developed, well-nourished, somewhat small, black male in mild distress with pain and nausea. HEENT:  Normocephalic, traumatic. NECK:  Supple. LUNGS:  Clear with normal effort. HEART:  Regular rate and rhythm. ABDOMEN:  Slightly distended with moderate diffuse tenderness and fullness.  No mass, hepatosplenomegaly, or hernias noted.  He has well- healed abdominal incisions.  He has no inguinal or femoral adenopathy. GU:  A circumcised phallus with an adequate meatus.  Scrotum is unremarkable.  Testicles and epididymides are unremarkable. RECTAL:  Not performed. SKIN:  Warm and dry. NEUROLOGICAL:  He  has no focal deficits. EXTREMITIES:  Full range of motion without edema.  I have reviewed his CT films and report from today in 2010 and those findings are noted above.  LABORATORY DATA:  CBC is 4.5, hemoglobin 9.2, hematocrit 27.6, platelet count 184.  CMP had a sodium of 136, potassium 4.1, chloride of 97, CO2 of 22, BUN of 33, creatinine of 10.92, and a glucose of 75.  His AST is 11 and his lipase was 30.  I did perform a cystoscopy with evacuation of old blood that will be dictated separately.  IMPRESSION: 1. Abdominal pain and nausea and anuric and with history of an     augmentation cystoplasty and markedly dilated bladder with left      hydronephrosis on CT scan. 2. 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 3. Recent TEE, now on Coumadin. 4. He has no fever or leukocytosis.  The material removed from the     bladder is not malodorous.  I doubt active urinary tract infection     or infection. 5. The 1100 mL of blood that were removed from the bladder were     liquefied and old and it could have been there quite chronically;     however, that it is difficult to determine exactly when the     bleeding may have occurred.  PLAN: 1. He needs to be admitted for observation overnight, but due to his     complicated nature that it should be done by the Medical Service. 2. A urine culture was sent. 3. The Foley catheter was left indwelling and it will need to be     irrigated with normal saline 30-60 mL every 4 hours overnight. 4. We will check a PT and a PTT to make sure he is not out of     therapeutic range. 5. We will need repeat cystoscopy in the OR at some point to assess     the source of the bleeding.    Marshall Cork. Jeffie Pollock, M.D.    JJW/MEDQ  D:  11/28/2010  T:  11/28/2010  Job:  [id number]  cc:   Caren Griffins B. Lorrene Reid, M.D.  Electronically Signed by Irine Seal M.D. on 12/06/2010 08:40:18 AM

## 2010-12-07 ENCOUNTER — Ambulatory Visit: Payer: Medicare Other | Admitting: Gastroenterology

## 2010-12-08 NOTE — Op Note (Signed)
NAME:  AVI, TROTTI NO.:  [id number]  MEDICAL RECORD NO.:  [id number]  LOCATION:  DAYL                         FACILITY:  [facility]  PHYSICIAN:  Marshall Cork. Jeffie Pollock, M.D.    DATE OF BIRTH:  1955/05/30  DATE OF PROCEDURE:  12/06/2010 DATE OF DISCHARGE:                              OPERATIVE REPORT   PROCEDURE:  Cystoscopy with bilateral retrograde pyelograms with interpretation, bladder irrigation and clot evacuation.  PREOPERATIVE DIAGNOSIS:  Gross hematuria and hydronephrosis.  POSTOPERATIVE DIAGNOSIS:  Gross hematuria and hydronephrosis.  SURGEON:  Marshall Cork. Jeffie Pollock, M.D.  ANESTHESIA:  General.  SPECIMEN:  Bladder cytologies.  DRAINS:  None.  COMPLICATIONS:  None.  INDICATIONS:  [name] is a 55 year old African-American male with reflux, nephropathy and end-stage renal disease, who was seen in the emergency room recently with bladder completely full of liquified blood, approximately 1100 mL were removed.  He had formally been on a new cath but had no urine output for quite some time.  Did not cath since 2007. He had had a cystoscopy in 2010 in [place].  It was felt that cystoscopy and bilateral retrograde pyelograms were indicated to assess for neoplastic changes in his bladder which had been augmented in the past and retrograde pyelograms performed as well for further evaluation of his upper tracts.  He was given Cipro.  He was taken to the operating room where general anesthetic was induced.  Cystoscopy was performed using a 22-French scope and 12 and 70 degrees lenses, also a 28-French continuous flow resectoscope sheath was used to aid irrigation.  On initial inspection, the patient was noted to have a normal urethra.  The external sphincter was intact.  The prostatic urethra was short with some cavitary changes in the distal prostatic urethra but bilobar hyperplasia more proximally. The mucosa of the of the native bladder had appearance consistent  with diffuse cystitis glandularis but no papillary tumors or other changes suspicious for neoplasm were noted.  Ureteral orifices were noted.  They were wide-mouth consistent with reflux nephropathy.  During the initial inspection, there was still some of the old liquified blood in the bladder and this was evacuated through multiple irrigation.  Inspection of the mucosa of the augmentation section revealed unremarkable gastrointestinal mucosa without evidence of neoplastic changes and obvious source of bleeding.  Once thorough irrigation and inspection had been performed, saline was used to obtain a barbotage cytology which will be sent to the pathology lab.  He then underwent retrograde pyelography.  Initially, the right orifice was cannulated with an open-end catheter and contrast was instilled. This revealed a somewhat tortuous but nondilated ureter to the kidney. There were no intrarenal filling defects or hydronephrosis, but there were some calyces that were distorted from the patient's known renal cyst.  The left ureteral orifice was then cannulated and Albarran bridge with a 70 degrees lens was required to obtain a proper angle.  Contrast was instilled.  The left retrograde pyelogram revealed marked J hooking of the distal ureter but an unremarkable ureter above that with no filling defects or hydronephrosis noted in the internal collecting system.  At this point, final inspection of bladder revealed no  active bleeding points and nothing suspicious for neoplasm that felt required biopsy. The bladder was then completely drained.  The scope was removed.  He was taken down from lithotomy position.  His anesthetic was reversed and he was moved to the recovery room in stable condition.  There were no complications.     Marshall Cork. Jeffie Pollock, M.D.     JJW/MEDQ  D:  12/06/2010  T:  12/06/2010  Job:  [id number]  cc:   Caren Griffins B. Lorrene Reid, M.D. Fax: [phone]  Electronically Signed by  Irine Seal M.D. on 12/08/2010 02:25:49 PM

## 2010-12-18 NOTE — H&P (Signed)
NAME:  Logan Martin, Logan Martin NO.:  [id number]  MEDICAL RECORD NO.:  [id number]  LOCATION:                                 FACILITY:  PHYSICIAN:  Jana Hakim, M.D. DATE OF BIRTH:  1956/04/29  DATE OF ADMISSION:  11/28/2010 DATE OF DISCHARGE:                             HISTORY & PHYSICAL   CHIEF COMPLAINT:  Abdominal pain, nausea, and urinary retention.  This is a 55 year old African American gentleman with previous history of end-stage renal disease, hepatitis C, anemia of chronic disease, hypertension, and pulmonary embolism, on Coumadin therapy, who presented to the emergency department of [facility] with the above complaints.  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  There appears to be a new UVJ with mild-to- moderate left hydronephrosis probably related to stricture or narrowing as it tie in with the augmented bladder.  The emergency department attempt to catheterize, the bladder failed, and it was noted that the patient had some bloody discharge.  Dr. Jeffie Pollock urologist was notified and he performed a cystoscopy with evacuation of large amount of nearly 1200 mL of liquefied blood.  PAST MEDICAL HISTORY:  Significant for pulmonary embolism and the patient is on Coumadin therapy.  He also has a history of CAD, end-stage renal disease, on hemodialysis, three times a week on Tuesday, Thursday, and Saturday.  He has a history of GERD, hepatitis C, hypertension, anemia of chronic disease, hyperparathyroidism that is secondary, and history of pancreatitis.  FAMILY HISTORY:  Noncontributory to this admission.  SOCIAL HISTORY:  The patient moved to the [place] from [place] in 2009.  Has two daughters.  He quit smoking, drinking, and using all illicit drugs in [id number] and went to the rehab therapy.  ALLERGIES:  He is allergic to SULFA, and  his food allergy - he is allergic to PORK.  CODE STATUS:  He is a full code.  HOME MEDICATIONS:  He is on Coumadin, omeprazole, oxycodone/acetaminophen, PhosLo, Sensipar.  REVIEW OF SYSTEMS:  He complained of abdominal pain and nausea with heaves, also inability to urinate and occasionally experiences indigestion, has dry skin.  Otherwise, he denied other complaints.  The patient denied chest pain, shortness of breath, dizziness, lightheadedness.  He denied any constipation.  Michela Pitcher that he makes a little bit of urine and that was new for him that he was unable to urinate.  Denied any myalgia or arthralgia.  PHYSICAL EXAM:  VITAL SIGNS:  Revealed blood pressure of 121/69 on admission, later blood pressure 166/84, heart rate 88, respirations 20, pulse oximetry 100% on room air, temperature 98.2. HEENT:  Normocephalic, atraumatic.  Mucous membranes somewhat dry. Sclerae slightly icteric. NECK:  Without any JVD or carotid bruits. LUNGS:  Without rales, rhonchi, or wheezes.  Clear to auscultation bilaterally. HEART:  Regular rate and rhythm with [id number] systolic murmur at the left sternal border. ABDOMEN:  Mildly distended and tender to palpation throughout.  Anterior abdominal wall has a scar after a ventral hernia repair and it is possible to palpate loops of the intestine sliding to the incisional hernia.  Liver and spleen impalpable.  No masses appreciated. EXTREMITIES:  Lower extremities without any signs of edema.  Pulses of the upper and lower extremities are palpable and 1+. SKIN:  Dry, but intact without any lesions or sores. NEURO:  Grossly intact.  There are no focal abnormalities.  LABORATORY:  His white blood cell count was 4.5, hemoglobin 9.2, hematocrit 27.6, platelet count 184.  Sodium 136, potassium 4.1, chloride 97, carbon dioxide 22, glucose 75, BUN 33, creatinine 10.92, calcium 9.0, total protein 7.2, albumin 3.8, AST 11, ALT 5, alkaline phosphatase 164, bilirubin  total 0.2.  Pro-time 21.8, INR 1.86, PTT 57.  Abdominal CT results as described to the beginning of dictation.  IMPRESSION AND PLAN: 1. Abdominal pain with nausea - somewhat improved after the urinary     procedure, antiemetics and pain killers were given. 2. Bladder retention with 1200 mL liquified blood, status post     cystoscopy with evacuation of the hemorrhagic content. 3. Pulmonary embolism - the patient is on Coumadin.  We will hold it     for now and defer the decision to the rounding physician whether to     restart or continue to hold.  The case might need to be discussed     with Dr. Jeffie Pollock in regards to restarting Coumadin therapy. 4. End-stage renal disease.  We will notify Renal Service about the     patient's admission, so they could put him on hemodialysis list     while in the hospital.  Next, this was recommended by Dr. Jeffie Pollock.     The patient's bladder needs to be irrigated every 4 hours during     the night of admission and eventually the patient will need to     undergo another cystoscopy in the OR to assess the source of     bleeding.     York Grice, P.A.   ______________________________ Jana Hakim, M.D.    MK/MEDQ  D:  11/28/2010  T:  11/28/2010  Job:  [id number]  Electronically Signed by York Grice P.A. on 11/28/2010 07:53:36 PM Electronically Signed by Jana Hakim M.D. on 12/18/2010 11:52:43 AM

## 2010-12-21 NOTE — Discharge Summary (Signed)
  NAME:  Logan Martin, Logan Martin NO.:  [id number]  MEDICAL RECORD NO.:  [id number]  LOCATION:  DIALYSI                      FACILITY:  [facility]  PHYSICIAN:  Leana Gamer, MDDATE OF BIRTH:  1956/04/01  DATE OF ADMISSION:  11/28/2010 DATE OF DISCHARGE:  11/28/2010                              DISCHARGE SUMMARY   The patient left against medical advice on November 28, 2010.  DIAGNOSES:  At the time of discharge include the following; 1. Hematuria, resolved. 2. Abdominal pain. 3. Pulmonary embolism. 4. End-stage renal disease.  DISCHARGE MEDICATIONS:  None.  HOSPITAL COURSE:  Please see the dictated H and P by Dr. Arnoldo Morale for discharge summary.  However, this is a gentleman with end-stage renal disease, hepatitis C, history of pulmonary embolism on Coumadin therapy, who is admitted to the hospital.  He came to the hospital after he had urinary retention.  The attempts were made to place a catheter in the emergency room and is stable.  The patient had bloody discharge.  Dr. Louie Casa, urologist notify and perform the cystoscopy with evacuation of large amounts of nearly 1200 mL of liquified blood and clots.  The patient subsequently went to dialysis as scheduled and received his dialysis.  Upon return from dialysis, the patient demanded to leave immediately.  It was discussed with the patient that it was imperative that he remain hospitalized, so that we restarted his Coumadin to ensure that he had no further bleeding from the urinary tract.  The patient actually refused stating that he had to go because his daughter was in the emergency room.  I came to the floor and spoke to the patient and even offered him the opportunity to go down to the emergency room to see his daughter, which he initially agreed upon and then come back up for continuous treatment.  The patient then made a decision that he had to leave and left.  The patient signed the papers for against  medical advice and left without any medications.     Leana Gamer, MD     MAM/MEDQ  D:  12/14/2010  T:  12/14/2010  Job:  [id number]  Electronically Signed by Liston Alba MD on 12/21/2010 10:33:57 AM

## 2011-11-16 ENCOUNTER — Other Ambulatory Visit (HOSPITAL_COMMUNITY): Payer: Self-pay | Admitting: Nephrology

## 2011-11-16 DIAGNOSIS — N186 End stage renal disease: Secondary | ICD-10-CM

## 2011-11-19 ENCOUNTER — Other Ambulatory Visit (HOSPITAL_COMMUNITY): Payer: Self-pay | Admitting: Nephrology

## 2011-11-19 ENCOUNTER — Ambulatory Visit (HOSPITAL_COMMUNITY)
Admission: RE | Admit: 2011-11-19 | Discharge: 2011-11-19 | Disposition: A | Discharge status: Home / Self Care | Payer: Medicare Other | Source: Ambulatory Visit | Attending: Nephrology | Admitting: Nephrology

## 2011-11-19 DIAGNOSIS — N186 End stage renal disease: Secondary | ICD-10-CM

## 2011-11-19 DIAGNOSIS — T82898A Other specified complication of vascular prosthetic devices, implants and grafts, initial encounter: Secondary | ICD-10-CM | POA: Insufficient documentation

## 2011-11-19 DIAGNOSIS — Y849 Medical procedure, unspecified as the cause of abnormal reaction of the patient, or of later complication, without mention of misadventure at the time of the procedure: Secondary | ICD-10-CM | POA: Insufficient documentation

## 2011-11-19 MED ORDER — IOHEXOL 300 MG/ML  SOLN
100.0000 mL | Freq: Once | INTRAMUSCULAR | Status: AC | PRN
  Administered 2011-11-19: 24 mL via INTRAVENOUS

## 2011-11-19 NOTE — Procedures (Signed)
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  Patient will return later this week for intervention with sedation.  No immediate complications.

## 2011-11-22 ENCOUNTER — Encounter (HOSPITAL_COMMUNITY): Payer: Self-pay | Admitting: Pharmacy Technician

## 2011-11-23 ENCOUNTER — Ambulatory Visit (HOSPITAL_COMMUNITY)
Admission: RE | Admit: 2011-11-23 | Discharge: 2011-11-23 | Disposition: A | Discharge status: Home / Self Care | Payer: Medicare Other | Source: Ambulatory Visit | Attending: Nephrology | Admitting: Nephrology

## 2011-11-23 ENCOUNTER — Other Ambulatory Visit (HOSPITAL_COMMUNITY): Payer: Self-pay | Admitting: Nephrology

## 2011-11-23 ENCOUNTER — Encounter (HOSPITAL_COMMUNITY): Payer: Self-pay

## 2011-11-23 DIAGNOSIS — N186 End stage renal disease: Secondary | ICD-10-CM

## 2011-11-23 DIAGNOSIS — I871 Compression of vein: Secondary | ICD-10-CM | POA: Insufficient documentation

## 2011-11-23 DIAGNOSIS — T82898A Other specified complication of vascular prosthetic devices, implants and grafts, initial encounter: Secondary | ICD-10-CM | POA: Insufficient documentation

## 2011-11-23 DIAGNOSIS — Z992 Dependence on renal dialysis: Secondary | ICD-10-CM | POA: Insufficient documentation

## 2011-11-23 DIAGNOSIS — Y832 Surgical operation with anastomosis, bypass or graft as the cause of abnormal reaction of the patient, or of later complication, without mention of misadventure at the time of the procedure: Secondary | ICD-10-CM | POA: Insufficient documentation

## 2011-11-23 MED ORDER — FENTANYL CITRATE 0.05 MG/ML IJ SOLN
INTRAMUSCULAR | Status: AC
  Filled 2011-11-23: qty 4

## 2011-11-23 MED ORDER — MIDAZOLAM HCL 2 MG/2ML IJ SOLN
INTRAMUSCULAR | Status: AC
  Filled 2011-11-23: qty 4

## 2011-11-23 MED ORDER — FENTANYL CITRATE 0.05 MG/ML IJ SOLN
INTRAMUSCULAR | Status: AC | PRN
  Administered 2011-11-23 (×3): 50 ug via INTRAVENOUS

## 2011-11-23 MED ORDER — MIDAZOLAM HCL 5 MG/5ML IJ SOLN
INTRAMUSCULAR | Status: AC | PRN
  Administered 2011-11-23 (×3): 1 mg via INTRAVENOUS

## 2011-11-23 MED ORDER — IOHEXOL 300 MG/ML  SOLN
100.0000 mL | Freq: Once | INTRAMUSCULAR | Status: AC | PRN
  Administered 2011-11-23: 20 mL via INTRAVENOUS

## 2011-11-23 NOTE — H&P (Signed)
Logan Martin is an 56 y.o. male.   Chief Complaint: Rt arm dialysis graft studied 6/24: Rt subclavian vein stenosis Pt returns today for angioplasty/stnet to be performed using sedation HPI: ESRD  Past Medical History  Diagnosis Date  . Renal insufficiency   . Hx of bladder repair surgery     No past surgical history on file.  No family history on file. Social History:  does not have a smoking history on file. He does not have any smokeless tobacco history on file. His alcohol and drug histories not on file.  Allergies:  Allergies  Allergen Reactions  . Heparin   . Sulfa Antibiotics   . Sulfonamide Derivatives      (Not in a hospital admission)  No results found for this or any previous visit (from the past 48 hour(s)). No results found.  Review of Systems  Constitutional: Negative for fever and chills.  Respiratory: Negative for cough.   Gastrointestinal: Negative for nausea and vomiting.  Neurological: Negative for dizziness and headaches.    Blood pressure 147/54, pulse 78, temperature 97.8 F (36.6 C), temperature source Oral, resp. rate 13. Physical Exam  Constitutional: He is oriented to person, place, and time. He appears well-developed and well-nourished.  Cardiovascular: Normal rate, regular rhythm and normal heart sounds.   No murmur heard. Respiratory: Effort normal and breath sounds normal. He has no wheezes.  GI: Soft. Bowel sounds are normal. There is no tenderness.  Musculoskeletal: Normal range of motion.       Rt arm dialysis graft; last used 6/27  Neurological: He is alert and oriented to person, place, and time.  Skin: Skin is warm and dry.  Psychiatric: He has a normal mood and affect. His behavior is normal. Judgment and thought content normal.     Assessment/Plan Rt arm graft shuntogram 6/24: rt subclavian vein stenosis- Dr. Pascal Lux Pt has returned for treatment with sedation Pt aware of procedure benefits and risks and agreeable to  proceed. Consent in chart  Esequiel Kleinfelter A 11/23/2011, 7:59 AM

## 2011-11-23 NOTE — Procedures (Signed)
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 See complete dictation in [facility].

## 2011-11-23 NOTE — Discharge Instructions (Addendum)
Moderate Sedation, Adult Moderate sedation is given to help you relax or even sleep through a procedure. You may remain sleepy, be clumsy, or have poor balance for several hours following this procedure. Arrange for a responsible adult, family member, or friend to take you home. A responsible adult should stay with you for at least 24 hours or until the medicines have worn off.  Do not participate in any activities where you could become injured for the next 24 hours, or until you feel normal again. Do not:   Drive.   Swim.   Ride a bicycle.   Operate heavy machinery.   Cook.   Use power tools.   Climb ladders.   Work at [employer].   Do not make important decisions or sign legal documents until you are improved.   Vomiting may occur if you eat too soon. When you can drink without vomiting, try water, juice, or soup. Try solid foods if you feel little or no nausea.   Only take over-the-counter or prescription medications for pain, discomfort, or fever as directed by your caregiver.If pain medications have been prescribed for you, ask your caregiver how soon it is safe to take them.   Make sure you and your family fully understands everything about the medication given to you. Make sure you understand what side effects may occur.   You should not drink alcohol, take sleeping pills, or medications that cause drowsiness for at least 24 hours.   If you smoke, do not smoke alone.   If you are feeling better, you may resume normal activities 24 hours after receiving sedation.   Keep all appointments as scheduled. Follow all instructions.   Ask questions if you do not understand.  SEEK MEDICAL CARE IF:   Your skin is pale or bluish in color.   You continue to feel sick to your stomach (nauseous) or throw up (vomit).   Your pain is getting worse and not helped by medication.   You have bleeding or swelling.   You are still sleepy or feeling clumsy after 24 hours.  SEEK IMMEDIATE  MEDICAL CARE IF:   You develop a rash.   You have difficulty breathing.   You develop any type of allergic problem.   You have a fever.  Document Released: 02/06/2001 Document Revised: 05/03/2011 Document Reviewed: 06/30/2007 [facility] Patient Information 2012 [place].  PATIENT TO RETURN TO RADIOLOGY DEPARTMENT Saturday June 29 th between 8:00 AM and 12:00 Noon to have suture removed from graft site

## 2011-11-23 NOTE — ED Notes (Signed)
Discharge instructions reviewed with pt and his brother.  Pt to follow up with radiology dept tomorrow between 0800 and 1200 for suture removal from angio site.  Comprehension and willingness to comply verbalized.  Questions denied.  Pt discharged home

## 2012-02-04 DIAGNOSIS — I2510 Atherosclerotic heart disease of native coronary artery without angina pectoris: Secondary | ICD-10-CM | POA: Insufficient documentation

## 2012-02-04 DIAGNOSIS — I2699 Other pulmonary embolism without acute cor pulmonale: Secondary | ICD-10-CM | POA: Insufficient documentation

## 2012-02-04 DIAGNOSIS — D631 Anemia in chronic kidney disease: Secondary | ICD-10-CM | POA: Insufficient documentation

## 2012-02-04 DIAGNOSIS — N189 Chronic kidney disease, unspecified: Secondary | ICD-10-CM | POA: Insufficient documentation

## 2012-02-04 DIAGNOSIS — N2581 Secondary hyperparathyroidism of renal origin: Secondary | ICD-10-CM | POA: Insufficient documentation

## 2012-02-04 DIAGNOSIS — B182 Chronic viral hepatitis C: Secondary | ICD-10-CM | POA: Insufficient documentation

## 2012-03-10 ENCOUNTER — Emergency Department (HOSPITAL_COMMUNITY): Payer: Medicare Other

## 2012-03-10 ENCOUNTER — Encounter (HOSPITAL_COMMUNITY): Payer: Self-pay | Admitting: *Deleted

## 2012-03-10 ENCOUNTER — Emergency Department (HOSPITAL_COMMUNITY)
Admission: EM | Admit: 2012-03-10 | Discharge: 2012-03-10 | Disposition: A | Discharge status: Home / Self Care | Payer: Medicare Other | Attending: Emergency Medicine | Admitting: Emergency Medicine

## 2012-03-10 DIAGNOSIS — R10815 Periumbilic abdominal tenderness: Secondary | ICD-10-CM | POA: Insufficient documentation

## 2012-03-10 DIAGNOSIS — Z9889 Other specified postprocedural states: Secondary | ICD-10-CM | POA: Insufficient documentation

## 2012-03-10 DIAGNOSIS — N189 Chronic kidney disease, unspecified: Secondary | ICD-10-CM | POA: Insufficient documentation

## 2012-03-10 DIAGNOSIS — R109 Unspecified abdominal pain: Secondary | ICD-10-CM | POA: Insufficient documentation

## 2012-03-10 DIAGNOSIS — R110 Nausea: Secondary | ICD-10-CM | POA: Insufficient documentation

## 2012-03-10 DIAGNOSIS — Q618 Other cystic kidney diseases: Secondary | ICD-10-CM | POA: Insufficient documentation

## 2012-03-10 DIAGNOSIS — Z992 Dependence on renal dialysis: Secondary | ICD-10-CM | POA: Insufficient documentation

## 2012-03-10 HISTORY — DX: Unspecified viral hepatitis C without hepatic coma: B19.20

## 2012-03-10 LAB — CBC WITH DIFFERENTIAL/PLATELET
Basophils Absolute: 0 10*3/uL (ref 0.0–0.1)
Basophils Relative: 1 % (ref 0–1)
Eosinophils Absolute: 0.3 10*3/uL (ref 0.0–0.7)
Eosinophils Relative: 6 % — ABNORMAL HIGH (ref 0–5)
HCT: 38.5 % — ABNORMAL LOW (ref 39.0–52.0)
Hemoglobin: 12.4 g/dL — ABNORMAL LOW (ref 13.0–17.0)
Lymphocytes Relative: 30 % (ref 12–46)
Lymphs Abs: 1.3 10*3/uL (ref 0.7–4.0)
MCH: 30.8 pg (ref 26.0–34.0)
MCHC: 32.2 g/dL (ref 30.0–36.0)
MCV: 95.8 fL (ref 78.0–100.0)
Monocytes Absolute: 0.5 10*3/uL (ref 0.1–1.0)
Monocytes Relative: 12 % (ref 3–12)
Neutro Abs: 2.2 10*3/uL (ref 1.7–7.7)
Neutrophils Relative %: 52 % (ref 43–77)
Platelets: 181 10*3/uL (ref 150–400)
RBC: 4.02 MIL/uL — ABNORMAL LOW (ref 4.22–5.81)
RDW: 17.7 % — ABNORMAL HIGH (ref 11.5–15.5)
WBC: 4.3 10*3/uL (ref 4.0–10.5)

## 2012-03-10 LAB — COMPREHENSIVE METABOLIC PANEL
ALT: 5 U/L (ref 0–53)
AST: 14 U/L (ref 0–37)
Albumin: 3.9 g/dL (ref 3.5–5.2)
Alkaline Phosphatase: 125 U/L — ABNORMAL HIGH (ref 39–117)
BUN: 30 mg/dL — ABNORMAL HIGH (ref 6–23)
CO2: 33 mEq/L — ABNORMAL HIGH (ref 19–32)
Calcium: 8.5 mg/dL (ref 8.4–10.5)
Chloride: 92 mEq/L — ABNORMAL LOW (ref 96–112)
Creatinine, Ser: 8.67 mg/dL — ABNORMAL HIGH (ref 0.50–1.35)
GFR calc Af Amer: 7 mL/min — ABNORMAL LOW (ref >90)
GFR calc non Af Amer: 6 mL/min — ABNORMAL LOW (ref >90)
Glucose, Bld: 90 mg/dL (ref 70–99)
Potassium: 3.5 mEq/L (ref 3.5–5.1)
Sodium: 137 mEq/L (ref 135–145)
Total Bilirubin: 0.3 mg/dL (ref 0.3–1.2)
Total Protein: 7.7 g/dL (ref 6.0–8.3)

## 2012-03-10 LAB — LIPASE, BLOOD: Lipase: 36 U/L (ref 11–59)

## 2012-03-10 MED ORDER — IOHEXOL 300 MG/ML  SOLN
100.0000 mL | Freq: Once | INTRAMUSCULAR | Status: AC | PRN
  Administered 2012-03-10: 100 mL via INTRAVENOUS

## 2012-03-10 MED ORDER — IOHEXOL 300 MG/ML  SOLN
20.0000 mL | INTRAMUSCULAR | Status: AC
  Administered 2012-03-10 (×2): 20 mL via ORAL

## 2012-03-10 NOTE — ED Notes (Addendum)
Pt resting in bed using IPAD. No distress noted

## 2012-03-10 NOTE — ED Provider Notes (Signed)
CT results reviewed and discussed with patient.  No indication of acute inflammatory process noted. Patient reports pain is currently controlled.  Patient states that he has pain medication at home.  Abdomen soft, bowel sounds present.  Patient has an appointment scheduled with Dr. Lorrene Reid on October 26.  Norman Herrlich, NP 03/10/12 1623

## 2012-03-10 NOTE — ED Provider Notes (Signed)
History     CSN: [id number]  Arrival date & time 03/10/12  1028   First MD Initiated Contact with Patient 03/10/12 1050      Chief Complaint  Patient presents with  . Abdominal Pain    (Consider location/radiation/quality/duration/timing/severity/associated sxs/prior treatment) HPI The patient presents to the emergency department with abdominal pain.  He states the pain started this morning in bed.  It is a sharp and "pinching" periumbilical pain that is [id number] at it's worst.  The patient states the pain is worse when he is laying flat or standing up straight.  The patient states a history of peritoneal dialysis and hernias that resulted in abdominal mesh surgery in 2001.  Since then, he has been on hemodialysis and has had no abdominal pain issues.  He reports nausea with the pain.  He denies vomiting, fevers, change in bowels, urinary changes, radiating pain, chest pain, sob, dizziness, and weakness.     Past Medical History  Diagnosis Date  . Renal insufficiency   . Hx of bladder repair surgery   . Hepatitis C     Past Surgical History  Procedure Date  . Hernia repair     No family history on file.  History  Substance Use Topics  . Smoking status: Former Research scientist (life sciences)  . Smokeless tobacco: Not on file  . Alcohol Use: No      Review of Systems All pertinent positives and negatives in the history of present illness   Allergies  Heparin; Sulfa antibiotics; and Sulfonamide derivatives  Home Medications   Current Outpatient Rx  Name Route Sig Dispense Refill  . CALCIUM ACETATE 667 MG PO CAPS Oral Take 2,001 mg by mouth 3 (three) times daily with meals.    [name] CINACALCET HCL 60 MG PO TABS Oral Take 120 mg by mouth daily.    . EPOGEN IJ Injection Inject 0.63 mLs as directed 2 (two) times a week. Inject on Tuesdays and Saturdays.    . ADULT MULTIVITAMIN W/MINERALS CH Oral Take 1 tablet by mouth daily.    [name] RENA-VITE PO TABS Oral Take 1 tablet by mouth daily.    . OXYCODONE  HCL 30 MG PO TABS Oral Take 30 mg by mouth 3 (three) times daily.    [name] ZEMPLAR IV Intravenous Inject 0.8 mLs into the vein 3 (three) times a week. Inject on Tuesdays, Wednesdays, and Thursdays.      BP 147/83  Pulse 86  Temp 97.9 F (36.6 C) (Oral)  Resp 18  SpO2 98%  Physical Exam  Constitutional: He is oriented to person, place, and time. He appears well-developed and well-nourished.  HENT:  Head: Normocephalic and atraumatic.  Eyes: Conjunctivae normal and EOM are normal. Pupils are equal, round, and reactive to light.  Neck: Normal range of motion. Neck supple.  Cardiovascular: Normal rate, regular rhythm and normal heart sounds.   Pulmonary/Chest: Effort normal and breath sounds normal.  Abdominal: Soft. Bowel sounds are normal. He exhibits no distension and no abdominal bruit. There is tenderness in the periumbilical area. There is no rigidity, no rebound, no guarding and no CVA tenderness.    Musculoskeletal: Normal range of motion.  Neurological: He is alert and oriented to person, place, and time.  Skin: Skin is warm and dry. No rash noted. No erythema. No pallor.    ED Course  Procedures (including critical care time)   Labs Reviewed  COMPREHENSIVE METABOLIC PANEL  CBC WITH DIFFERENTIAL  LIPASE, BLOOD   Patient will have CT  and will be sent to the CDU. The patient has been stable here in the ER. He has been eating and requesting food   MDM  MDM Reviewed: nursing note and vitals Interpretation: labs, CT scan and x-ray           Brent General, PA-C 03/10/12 1612

## 2012-03-10 NOTE — ED Notes (Signed)
Pt has mesh in abdomen put there after doing peritoneal dialysis and had been getting hernias.  Now patient states woke up this am and felt like something stabbed him in his abdomen.  No vomiting.  Nauseated.  Pt is HD patient at home

## 2012-03-10 NOTE — ED Provider Notes (Signed)
Medical screening examination/treatment/procedure(s) were performed by non-physician practitioner and as supervising physician I was immediately available for consultation/collaboration.  Rhunette Croft, M.D.     Rhunette Croft, MD 03/10/12 2357

## 2012-03-10 NOTE — ED Provider Notes (Signed)
Assumed patient care from Irena Cords, PA-C. 56 year old male with history of peritoneal dialysis and history of abdominal hernia presents complaining of periumbilical abdominal pain. X-ray shows findings suspicious for bowel obstruction. Abdominal and pelvis CT scan ordered for further evaluation. Patient will be moved to CDU for further care.  2:06 PM On examination he appeared in good health and spirits. Vital signs as documented. Skin warm and dry and without overt rashes. Neck without JVD. Lungs clear. Heart exam notable for regular rhythm, normal sounds and absence of murmurs, rubs or gallops. Abdomen with multiple a well healing abdominal surgical scars. Periumbilical region is tender on palpation with a bulge, not reducible with palpation. Guarding without rebound tenderness. Extremities nonedematous.  Patient is currently treating contrast, while waiting CT scan.  3:23 PM Report given to oncoming CDU provider, who will continue pt care.  BP 147/83  Pulse 86  Temp 97.9 F (36.6 C) (Oral)  Resp 18  SpO2 98%  Nursing notes reviewed and considered in documentation  Previous records reviewed and considered  All labs/vitals reviewed and considered  xrays reviewed and considered  Results for orders placed during the hospital encounter of 03/10/12  COMPREHENSIVE METABOLIC PANEL      Component Value Range   Sodium 137  135 - 145 mEq/L   Potassium 3.5  3.5 - 5.1 mEq/L   Chloride 92 (*) 96 - 112 mEq/L   CO2 33 (*) 19 - 32 mEq/L   Glucose, Bld 90  70 - 99 mg/dL   BUN 30 (*) 6 - 23 mg/dL   Creatinine, Ser 8.67 (*) 0.50 - 1.35 mg/dL   Calcium 8.5  8.4 - 10.5 mg/dL   Total Protein 7.7  6.0 - 8.3 g/dL   Albumin 3.9  3.5 - 5.2 g/dL   AST 14  0 - 37 U/L   ALT 5  0 - 53 U/L   Alkaline Phosphatase 125 (*) 39 - 117 U/L   Total Bilirubin 0.3  0.3 - 1.2 mg/dL   GFR calc non Af Amer 6 (*) >90 mL/min   GFR calc Af Amer 7 (*) >90 mL/min  CBC WITH DIFFERENTIAL      Component Value Range   WBC 4.3  4.0 - 10.5 K/uL   RBC 4.02 (*) 4.22 - 5.81 MIL/uL   Hemoglobin 12.4 (*) 13.0 - 17.0 g/dL   HCT 38.5 (*) 39.0 - 52.0 %   MCV 95.8  78.0 - 100.0 fL   MCH 30.8  26.0 - 34.0 pg   MCHC 32.2  30.0 - 36.0 g/dL   RDW 17.7 (*) 11.5 - 15.5 %   Platelets 181  150 - 400 K/uL   Neutrophils Relative 52  43 - 77 %   Neutro Abs 2.2  1.7 - 7.7 K/uL   Lymphocytes Relative 30  12 - 46 %   Lymphs Abs 1.3  0.7 - 4.0 K/uL   Monocytes Relative 12  3 - 12 %   Monocytes Absolute 0.5  0.1 - 1.0 K/uL   Eosinophils Relative 6 (*) 0 - 5 %   Eosinophils Absolute 0.3  0.0 - 0.7 K/uL   Basophils Relative 1  0 - 1 %   Basophils Absolute 0.0  0.0 - 0.1 K/uL  LIPASE, BLOOD      Component Value Range   Lipase 36  11 - 59 U/L   Dg Abd Acute W/chest  03/10/2012  *RADIOLOGY REPORT*  Clinical Data: Abdominal pain and nausea.  ACUTE ABDOMEN SERIES (  ABDOMEN 2 VIEW & CHEST 1 VIEW)  Comparison: Chest x-ray 07/10 and 2012.  Findings: The upright chest x-ray is normal.  Two views of the abdomen demonstrates scattered air filled small bowel loops without distention.  No free air is identified.  No colonic air or stool.  Findings could be due to an early or partial small bowel obstruction.  Scattered vascular calcifications are noted.  The bony structures are intact.  IMPRESSION:  1.  Normal chest x-ray. 2.  Possible early or partial small bowel obstruction.   Original Report Authenticated By: P. Kalman Jewels, M.D.       Domenic Moras, PA-C 03/10/12 1524

## 2012-03-10 NOTE — ED Provider Notes (Signed)
Medical screening examination/treatment/procedure(s) were conducted as a shared visit with non-physician practitioner(s) and myself.  I personally evaluated the patient during the encounter   Charles B. Karle Starch, MD 03/10/12 1525

## 2012-03-10 NOTE — ED Notes (Signed)
When walked in pt room to start IV pt had just finished eating pizza and was drinking coffee. Pt aware NPO from this point on. CT and MD aware.

## 2012-03-11 NOTE — ED Provider Notes (Signed)
Medical screening examination/treatment/procedure(s) were performed by non-physician practitioner and as supervising physician I was immediately available for consultation/collaboration.   Charles B. Karle Starch, MD 03/11/12 1014

## 2012-08-04 ENCOUNTER — Non-Acute Institutional Stay (HOSPITAL_COMMUNITY)
Admission: EM | Admit: 2012-08-04 | Discharge: 2012-08-04 | Disposition: A | Discharge status: Home / Self Care | Payer: Medicare Other | Attending: Emergency Medicine | Admitting: Emergency Medicine

## 2012-08-04 ENCOUNTER — Emergency Department (HOSPITAL_COMMUNITY): Payer: Medicare Other

## 2012-08-04 ENCOUNTER — Encounter (HOSPITAL_COMMUNITY): Payer: Self-pay | Admitting: Family Medicine

## 2012-08-04 DIAGNOSIS — R1110 Vomiting, unspecified: Secondary | ICD-10-CM | POA: Insufficient documentation

## 2012-08-04 DIAGNOSIS — R0602 Shortness of breath: Secondary | ICD-10-CM | POA: Insufficient documentation

## 2012-08-04 DIAGNOSIS — N189 Chronic kidney disease, unspecified: Secondary | ICD-10-CM

## 2012-08-04 DIAGNOSIS — N179 Acute kidney failure, unspecified: Secondary | ICD-10-CM

## 2012-08-04 DIAGNOSIS — N186 End stage renal disease: Secondary | ICD-10-CM | POA: Insufficient documentation

## 2012-08-04 DIAGNOSIS — Z992 Dependence on renal dialysis: Secondary | ICD-10-CM | POA: Insufficient documentation

## 2012-08-04 HISTORY — DX: Alcohol abuse, uncomplicated: F10.10

## 2012-08-04 HISTORY — DX: Cocaine abuse, in remission: F14.11

## 2012-08-04 LAB — POCT I-STAT, CHEM 8
BUN: 70 mg/dL — ABNORMAL HIGH (ref 6–23)
Calcium, Ion: 1.08 mmol/L — ABNORMAL LOW (ref 1.12–1.23)
Chloride: 104 mEq/L (ref 96–112)
Glucose, Bld: 82 mg/dL (ref 70–99)
TCO2: 29 mmol/L (ref 0–100)

## 2012-08-04 MED ORDER — DARBEPOETIN ALFA-POLYSORBATE 100 MCG/0.5ML IJ SOLN
100.0000 ug | INTRAMUSCULAR | Status: DC
  Administered 2012-08-04: 100 ug via INTRAVENOUS
  Filled 2012-08-04: qty 0.5

## 2012-08-04 MED ORDER — DOXERCALCIFEROL 4 MCG/2ML IV SOLN
6.0000 ug | INTRAVENOUS | Status: DC
  Administered 2012-08-04: 6 ug via INTRAVENOUS
  Filled 2012-08-04: qty 4

## 2012-08-04 NOTE — ED Notes (Signed)
Pt is dialysis pt, receives dialysis MWF. Last week missed Friday apt r/t weather. Pt due for dialysis today, but c/o SOB and vomiting that began last night. States has apt at 1230 today but could not wait. EDPA at bedside.

## 2012-08-04 NOTE — ED Provider Notes (Signed)
Complains of shortness of breath onset 11 PM yesterday. Patient missed his last dialysis session as the dialysis center was closed due to poor weather. He denies other complaint. On exam alert speaks in sentences lungs Rales one third way up bilaterally  Orlie Dakin, MD 08/04/12 [phone]

## 2012-08-04 NOTE — ED Notes (Signed)
Pt returned from X-ray.  

## 2012-08-04 NOTE — ED Notes (Signed)
Dr. Lenna Sciara at bedside for assessment

## 2012-08-04 NOTE — ED Notes (Signed)
Sophia, PA spoke with Dr.Shwarts.

## 2012-08-04 NOTE — ED Provider Notes (Signed)
History     CSN: [id number]  Arrival date & time 08/04/12  0602   None     No chief complaint on file.   (Consider location/radiation/quality/duration/timing/severity/associated sxs/prior treatment) Patient is a 57 y.o. male presenting with shortness of breath and vomiting. The history is provided by the patient. No language interpreter was used.  Shortness of Breath Severity:  Moderate Onset quality:  Gradual Duration:  1 day Timing:  Constant Progression:  Worsening Chronicity:  New Relieved by:  Nothing Emesis Pt has end stage renal failure.  Pt last had dialysis on Wednesday 3/5.   Dialysis Center was closed on Friday.   Pt tried to make it until dialysis at noon today but became short of breath last night.   Pt complains of nausea and diarrhea.   Pt reports he tried to call dialysis center on [facility] before coming in but there was no answer.  Past Medical History  Diagnosis Date  . Renal insufficiency   . Hx of bladder repair surgery   . Hepatitis C     Past Surgical History  Procedure Laterality Date  . Hernia repair      No family history on file.  History  Substance Use Topics  . Smoking status: Former Research scientist (life sciences)  . Smokeless tobacco: Not on file  . Alcohol Use: No      Review of Systems  Respiratory: Positive for shortness of breath.   All other systems reviewed and are negative.    Allergies  Heparin; Sulfa antibiotics; and Sulfonamide derivatives  Home Medications   Current Outpatient Rx  Name  Route  Sig  Dispense  Refill  . calcium acetate (PHOSLO) 667 MG capsule   Oral   Take 2,001 mg by mouth 3 (three) times daily with meals.         . cinacalcet (SENSIPAR) 60 MG tablet   Oral   Take 120 mg by mouth daily.         [name] Epoetin Alfa (EPOGEN IJ)   Injection   Inject 0.8 mLs as directed 2 (two) times a week. Inject on Tuesdays and Saturdays.         . multivitamin (RENA-VIT) TABS tablet   Oral   Take 1 tablet by mouth daily.          [name] oxycodone (ROXICODONE) 30 MG immediate release tablet   Oral   Take 30 mg by mouth 3 (three) times daily.         . Paricalcitol (ZEMPLAR IV)   Intravenous   Inject 0.8 mLs into the vein 3 (three) times a week. Inject on Tuesdays, Wednesdays, and Thursdays.           There were no vitals taken for this visit.  Physical Exam  Nursing note and vitals reviewed. Constitutional: He appears well-developed and well-nourished.  HENT:  Head: Normocephalic and atraumatic.  Right Ear: External ear normal.  Left Ear: External ear normal.  Eyes: Pupils are equal, round, and reactive to light.  Neck: Normal range of motion.  Cardiovascular: Normal rate and normal heart sounds.   Pulmonary/Chest: He has rales. He exhibits no tenderness.  Abdominal: Soft.  Musculoskeletal: Normal range of motion.  Neurological: He is alert.  Skin: Skin is warm.  Psychiatric: He has a normal mood and affect.    ED Course  Procedures (including critical care time)  Labs Reviewed - No data to display No results found.   1. Renal failure (ARF), acute on chronic  MDM   Date: 08/04/2012  Rate: 106  Rhythm: sinus tachycardia  QRS Axis: normal  Intervals: normal  ST/T Wave abnormalities: normal  Conduction Disutrbances:none  Narrative Interpretation:   Old EKG Reviewed: unchanged   I spoke to Nephrology who will see here.   I paged Nephrology.   I spoke to Dr. Jonnie Finner who will order dialysis for pt.        [place], PA-C 08/04/12 1055

## 2012-08-04 NOTE — Progress Notes (Addendum)
See on HD. Came to ED for missed dialysis and onset of SOB last night.  Missed Friday HD due to weather. Tried to call dialysis today, but no answer (only one phone line working).  Breathing much better now. Goal 4.7 with 1.7 off O2 off.  BP 183/103 Qb 355.  Not weighed pre Hd.  Continue hectorol 6 and increase ESA from outpt dose.  Hgb on istat low at 8.5 Given 100 Aranesp today.  Will have Hgb recheck at his next dialysis treatment on Wednesday.  No changes in overall dialysis Rx.  Continue no heparin. For discharge after HD treatment.  Amalia Hailey, PA-C 08/04/2012  Patient seen and examined.  Agree with assessment and plan as above. Kelly Splinter  MD [phone] pgr    [phone] cell 08/04/2012, 6:46 PM

## 2012-08-04 NOTE — ED Notes (Signed)
Pt transported to Xray. 

## 2012-08-05 NOTE — ED Provider Notes (Signed)
Medical screening examination/treatment/procedure(s) were conducted as a shared visit with non-physician practitioner(s) and myself.  I personally evaluated the patient during the encounter  Orlie Dakin, MD 08/05/12 [phone]

## 2012-10-23 IMAGING — XA CT PELV - CT LOW EXTREM*L* W/ CM
1 series · 12 of 24 positions shown · non-contrast
Comparison: none

CLINICAL DATA: End-stage renal disease, right upper extremity
fistula, elevated venous pressures

[Series 1: run · 12 of 62 slices shown]
[im 3/62]
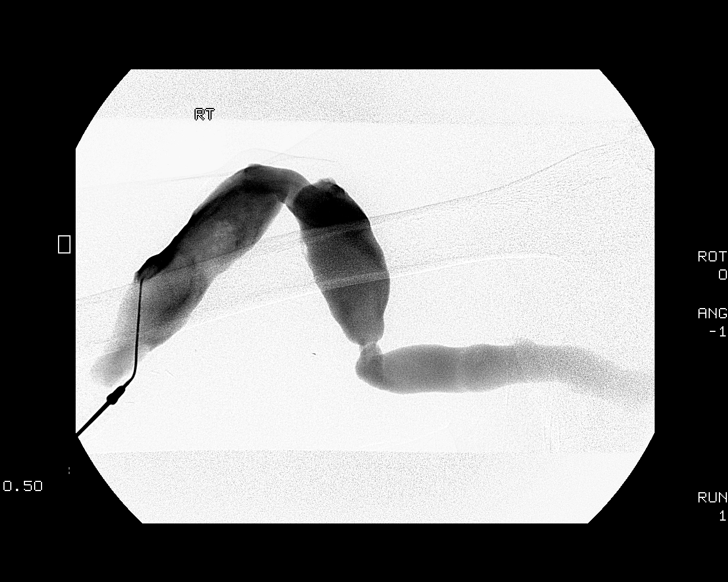
[im 8/62]
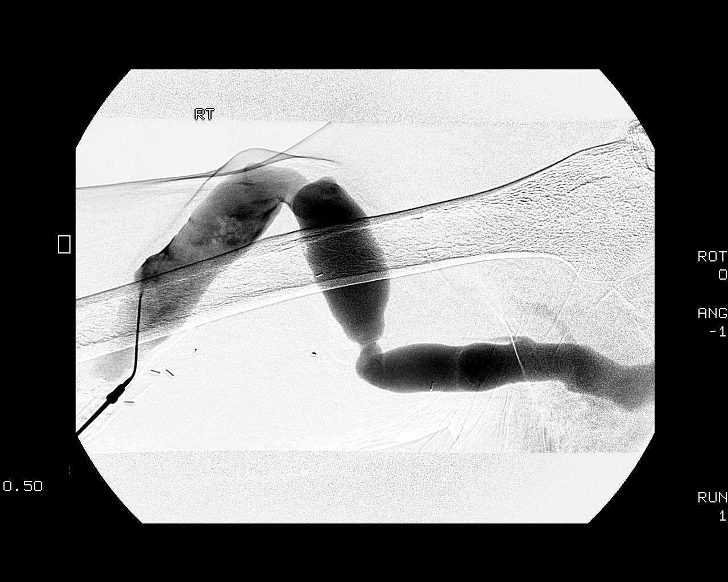
[im 14/62]
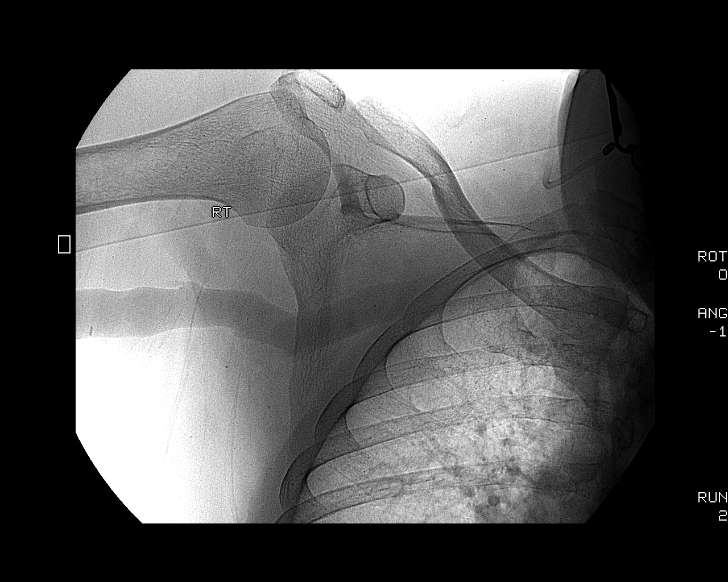
[im 19/62]
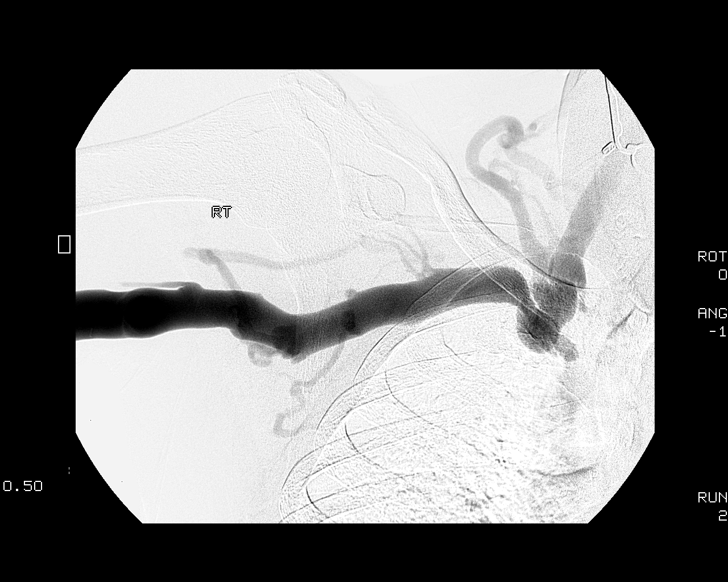
[im 24/62]
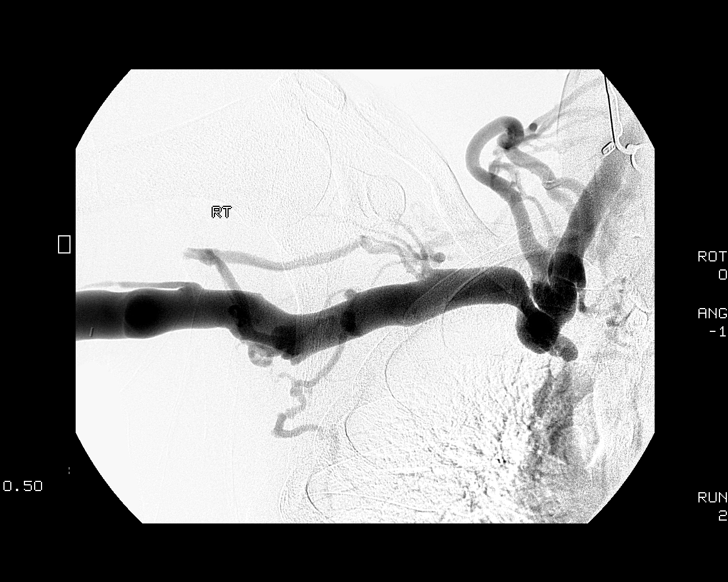
[im 30/62]
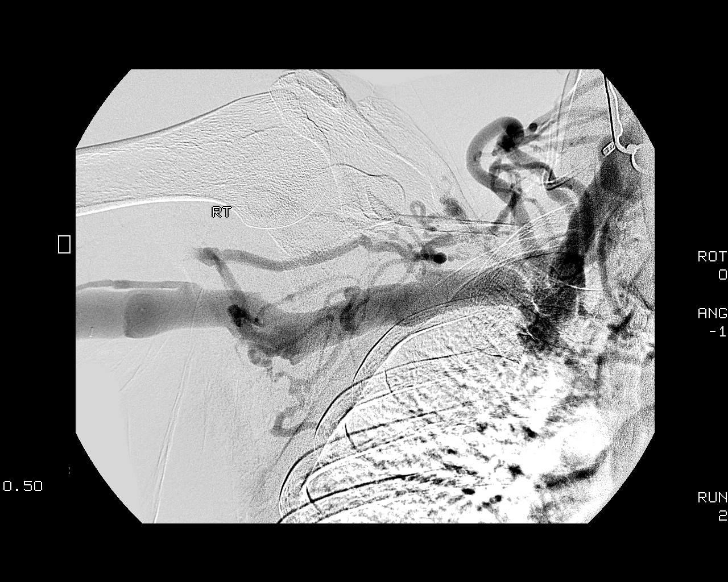
[im 35/62]
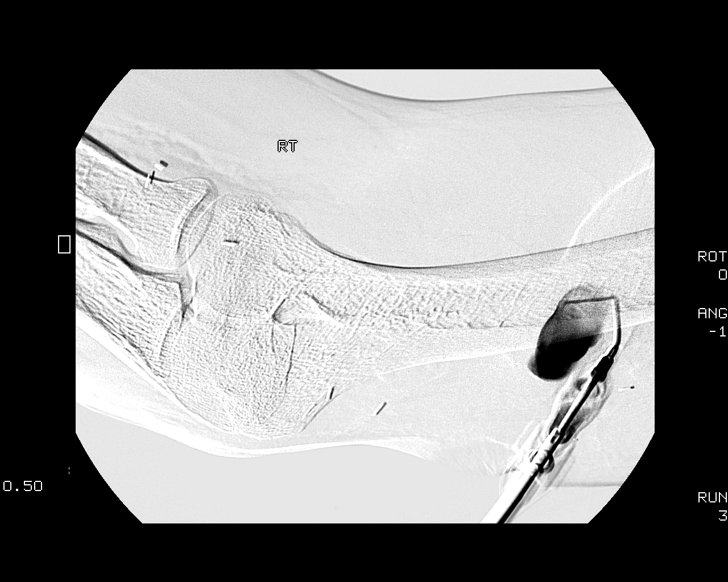
[im 40/62]
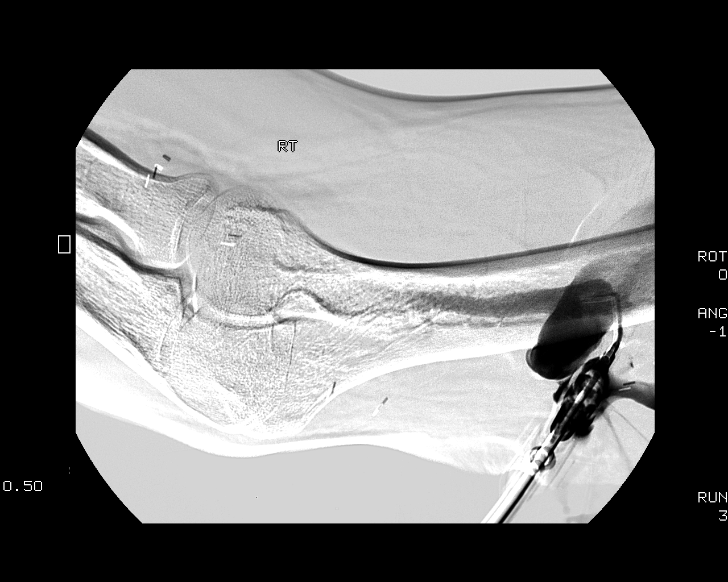
[im 46/62]
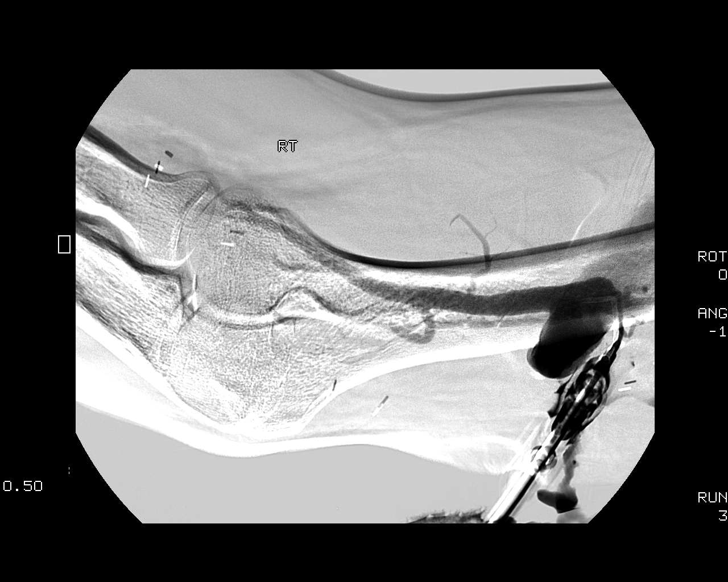
[im 51/62]
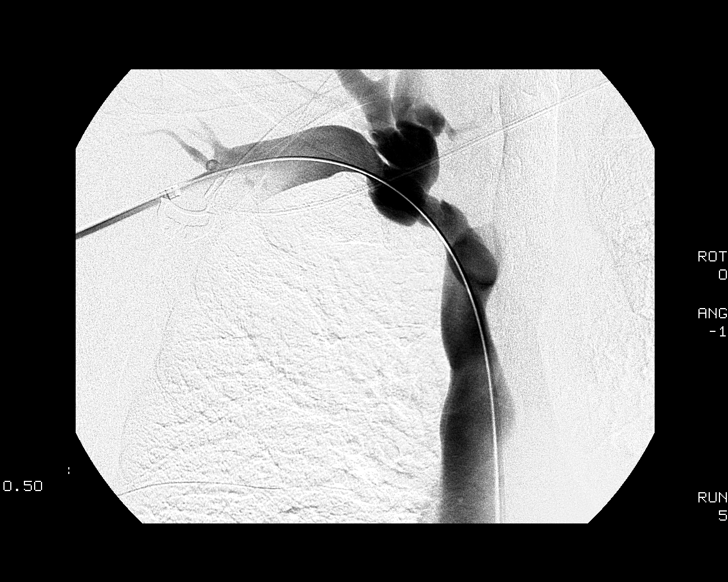
[im 56/62]
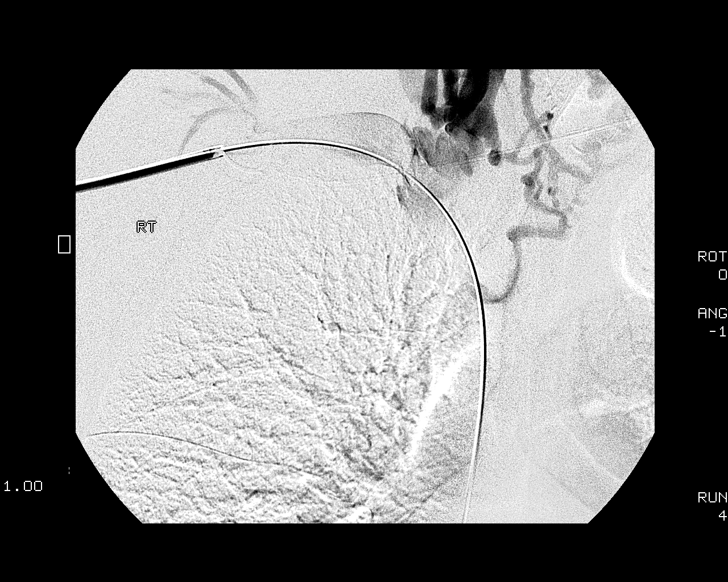
[im 62/62]
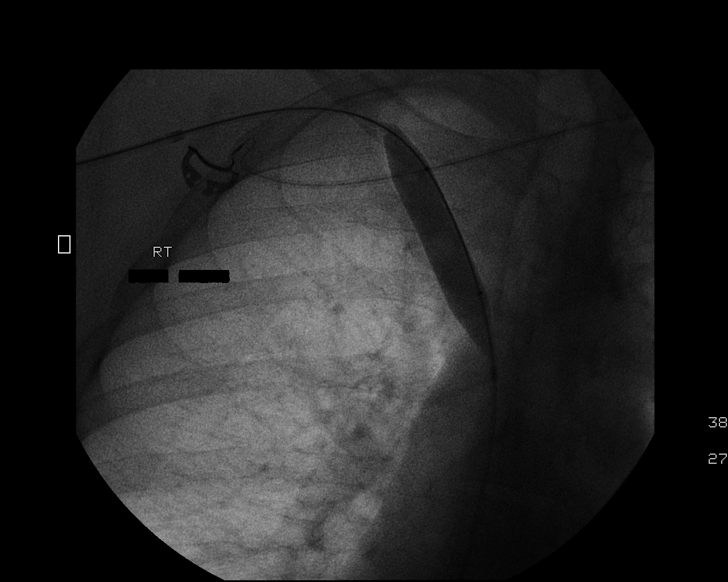

[12 of 24 positions shown; findings below may reference images not displayed]

RIGHT UPPER EXTREMITY AV FISTULOGRAM
12 MM RIGHT SUBCLAVIAN VENOUS ANGIOPLASTY

Date:  06/20/2010 [DATE]

Radiologist:  Argho Mirotha, M.D.

Medications:  2 mg Versed, 100 mcg Fentanyl

Guidance:  Ultrasound and fluoroscopic

Fluoroscopy time:  1.5 minutes

Sedation time:  15 minutes

Contrast volume:  50 ml [id number]

Complications:  No immediate

PROCEDURE/FINDINGS:

Informed consent was obtained from the patient following
explanation of the procedure, risks, benefits and alternatives.
The patient understands, agrees and consents for the procedure.
All questions were addressed.  A time out was performed.

Maximal barrier sterile technique utilized including caps, mask,
sterile gowns, sterile gloves, large sterile drape, hand hygiene,
and betadine

Under sterile conditions, the right upper arm fistula was accessed
with an 18 gauge angiocath.  Contrast injection performed for a
complete fistulogram from anastomosis to the right atrium.

Right upper extremity fistulogram:  Arterial anastomosis to the
brachial artery is patent.  Outflow fistula vein is markedly
dilated and tortuous in the mid humerus region.  Across the upper
arm and shoulder,  the high brachial and axillary veins are patent.
Centrally there is a significant stenosis focally of the right
subclavian vein resulting in jugular venous collateralization.
Below this, the innominate vein and SVC are patent.

12 mm subclavian venous angioplasty:  Under sterile conditions and
local anesthesia, a guide wire was advanced to allow insertion of a
7-French sheath.  Sheath was advanced through the fistula into the
right subclavian vein.  Catheter guide wire access were obtained
across the central subclavian venous stenosis.  Catheter guide wire
were advanced into the IVC.  Over the Amplatz guide wire, a 12 mm x
4 cm Atlas balloon was centered on the subclavian central stenosis.
A single prolonged inflation was performed for 60 seconds to 18
atmospheres.  The balloon was deflated removed.  A followup
venogram demonstrates significant improved patency of the
subclavian vein.  Minimal residual narrowing.  Collaterals remain
visible.  There is brisk central venous flow into the patent
innominate vein and SVC.  No additional treatments were performed.
Sheath and guide wire removed.  Hemostasis obtained with
compression.  No immediate complication.  The patient tolerated the
procedure well.
IMPRESSION: Right subclavian central focal stenosis with a good response to 12
mm venous angioplasty.

No significant right upper extremity peripheral venous outflow
stenosis.

## 2012-11-01 ENCOUNTER — Non-Acute Institutional Stay (HOSPITAL_COMMUNITY)
Admission: EM | Admit: 2012-11-01 | Discharge: 2012-11-01 | Disposition: A | Discharge status: Home / Self Care | Payer: Medicare Other | Attending: Emergency Medicine | Admitting: Emergency Medicine

## 2012-11-01 ENCOUNTER — Encounter (HOSPITAL_COMMUNITY): Payer: Self-pay | Admitting: Emergency Medicine

## 2012-11-01 DIAGNOSIS — N186 End stage renal disease: Secondary | ICD-10-CM | POA: Diagnosis present

## 2012-11-01 DIAGNOSIS — N189 Chronic kidney disease, unspecified: Secondary | ICD-10-CM | POA: Diagnosis present

## 2012-11-01 DIAGNOSIS — D631 Anemia in chronic kidney disease: Secondary | ICD-10-CM | POA: Insufficient documentation

## 2012-11-01 DIAGNOSIS — E875 Hyperkalemia: Secondary | ICD-10-CM | POA: Insufficient documentation

## 2012-11-01 DIAGNOSIS — B1920 Unspecified viral hepatitis C without hepatic coma: Secondary | ICD-10-CM | POA: Insufficient documentation

## 2012-11-01 DIAGNOSIS — Z79899 Other long term (current) drug therapy: Secondary | ICD-10-CM | POA: Insufficient documentation

## 2012-11-01 DIAGNOSIS — Z888 Allergy status to other drugs, medicaments and biological substances status: Secondary | ICD-10-CM | POA: Insufficient documentation

## 2012-11-01 DIAGNOSIS — I2510 Atherosclerotic heart disease of native coronary artery without angina pectoris: Secondary | ICD-10-CM | POA: Insufficient documentation

## 2012-11-01 DIAGNOSIS — Z882 Allergy status to sulfonamides status: Secondary | ICD-10-CM | POA: Insufficient documentation

## 2012-11-01 DIAGNOSIS — N2581 Secondary hyperparathyroidism of renal origin: Secondary | ICD-10-CM | POA: Insufficient documentation

## 2012-11-01 DIAGNOSIS — K219 Gastro-esophageal reflux disease without esophagitis: Secondary | ICD-10-CM | POA: Diagnosis present

## 2012-11-01 DIAGNOSIS — F1011 Alcohol abuse, in remission: Secondary | ICD-10-CM | POA: Insufficient documentation

## 2012-11-01 DIAGNOSIS — Z91018 Allergy to other foods: Secondary | ICD-10-CM | POA: Insufficient documentation

## 2012-11-01 DIAGNOSIS — N13729 Vesicoureteral-reflux with reflux nephropathy without hydroureter, unspecified: Secondary | ICD-10-CM | POA: Insufficient documentation

## 2012-11-01 DIAGNOSIS — I120 Hypertensive chronic kidney disease with stage 5 chronic kidney disease or end stage renal disease: Secondary | ICD-10-CM | POA: Insufficient documentation

## 2012-11-01 DIAGNOSIS — F1411 Cocaine abuse, in remission: Secondary | ICD-10-CM | POA: Insufficient documentation

## 2012-11-01 DIAGNOSIS — Z87891 Personal history of nicotine dependence: Secondary | ICD-10-CM | POA: Insufficient documentation

## 2012-11-01 DIAGNOSIS — Z992 Dependence on renal dialysis: Secondary | ICD-10-CM | POA: Insufficient documentation

## 2012-11-01 HISTORY — DX: End stage renal disease: N18.6

## 2012-11-01 LAB — BASIC METABOLIC PANEL
BUN: 60 mg/dL — ABNORMAL HIGH (ref 6–23)
CO2: 23 mEq/L (ref 19–32)
Calcium: 8.3 mg/dL — ABNORMAL LOW (ref 8.4–10.5)
Chloride: 105 mEq/L (ref 96–112)
Creatinine, Ser: 12.76 mg/dL — ABNORMAL HIGH (ref 0.50–1.35)
GFR calc Af Amer: 4 mL/min — ABNORMAL LOW (ref >90)
GFR calc non Af Amer: 4 mL/min — ABNORMAL LOW (ref >90)
Glucose, Bld: 74 mg/dL (ref 70–99)
Potassium: 5.8 mEq/L — ABNORMAL HIGH (ref 3.5–5.1)
Sodium: 141 mEq/L (ref 135–145)

## 2012-11-01 LAB — CBC
HCT: 30 % — ABNORMAL LOW (ref 39.0–52.0)
Hemoglobin: 9.7 g/dL — ABNORMAL LOW (ref 13.0–17.0)
MCH: 30.9 pg (ref 26.0–34.0)
MCHC: 32.3 g/dL (ref 30.0–36.0)
MCV: 95.5 fL (ref 78.0–100.0)
Platelets: 107 10*3/uL — ABNORMAL LOW (ref 150–400)
RBC: 3.14 MIL/uL — ABNORMAL LOW (ref 4.22–5.81)
RDW: 16.2 % — ABNORMAL HIGH (ref 11.5–15.5)
WBC: 4.2 10*3/uL (ref 4.0–10.5)

## 2012-11-01 MED ORDER — SODIUM CHLORIDE 0.9 % IV SOLN: 100.0000 mL | INTRAVENOUS | Status: DC | PRN

## 2012-11-01 MED ORDER — NEPRO/CARBSTEADY PO LIQD
237.0000 mL | ORAL | Status: DC | PRN
  Filled 2012-11-01: qty 237

## 2012-11-01 MED ORDER — HEPARIN SODIUM (PORCINE) 1000 UNIT/ML DIALYSIS: 1000.0000 [IU] | INTRAMUSCULAR | Status: DC | PRN

## 2012-11-01 MED ORDER — PENTAFLUOROPROP-TETRAFLUOROETH EX AERO: 1.0000 "application " | INHALATION_SPRAY | CUTANEOUS | Status: DC | PRN

## 2012-11-01 MED ORDER — CINACALCET HCL 30 MG PO TABS
120.0000 mg | ORAL_TABLET | Freq: Every day | ORAL | Status: DC
  Filled 2012-11-01: qty 4

## 2012-11-01 MED ORDER — LIDOCAINE HCL (PF) 1 % IJ SOLN: 5.0000 mL | INTRAMUSCULAR | Status: DC | PRN

## 2012-11-01 MED ORDER — CALCIUM ACETATE 667 MG PO CAPS
667.0000 mg | ORAL_CAPSULE | Freq: Three times a day (TID) | ORAL | Status: DC
  Filled 2012-11-01 (×2): qty 1

## 2012-11-01 MED ORDER — ALTEPLASE 2 MG IJ SOLR
2.0000 mg | Freq: Once | INTRAMUSCULAR | Status: DC | PRN
  Filled 2012-11-01: qty 2

## 2012-11-01 MED ORDER — LIDOCAINE-PRILOCAINE 2.5-2.5 % EX CREA
1.0000 "application " | TOPICAL_CREAM | CUTANEOUS | Status: DC | PRN
  Filled 2012-11-01: qty 5

## 2012-11-01 MED ORDER — OXYCODONE HCL 5 MG PO TABS
30.0000 mg | ORAL_TABLET | Freq: Three times a day (TID) | ORAL | Status: DC
  Administered 2012-11-01: 30 mg via ORAL

## 2012-11-01 MED ORDER — AMLODIPINE BESYLATE 10 MG PO TABS
10.0000 mg | ORAL_TABLET | Freq: Every day | ORAL | Status: DC
  Filled 2012-11-01: qty 1

## 2012-11-01 NOTE — H&P (Signed)
Internal Medicine On-Call Attending Admission Note Date: 11/01/2012  Patient name: Logan Martin Medical record number: [id number] Date of birth: 1956-03-11 Age: 57 y.o. Gender: male  I saw and evaluated the patient. I reviewed the resident's note and I agree with the resident's findings and plan as documented in the resident's note, with the following additional comments.  Chief Complaint(s): Need for dialysis; mild dyspnea  History - key components related to admission: Patient is a 57 year old man with history of end-stage renal disease on hemodialysis, currently living in [place] visiting family in [place], who presented to the emergency department with need for hemodialysis.  His last dialysis treatment was Tuesday, 4 days prior to admission.  He reports mild dyspnea.   Physical Exam - key components related to admission:  Filed Vitals:   11/01/12 1330 11/01/12 1400 11/01/12 1430 11/01/12 1500  BP: 178/91 183/92 178/94 172/90  Pulse: 77 77 81 74  Temp:      TempSrc:      Resp:      Weight:      SpO2:        General: Alert, oriented, no distress Lungs: Clear Heart: Regular; S1-S2, no S3, no S4, no murmurs Abdomen: Bowel sounds present, soft, nontender Extremities: No edema  Lab results:   Basic Metabolic Panel:  Recent Labs  11/01/12 0941  NA 141  K 5.8*  CL 105  CO2 23  GLUCOSE 74  BUN 60*  CREATININE 12.76*  CALCIUM 8.3*     CBC:  Recent Labs  11/01/12 0941  WBC 4.2  HGB 9.7*  HCT 30.0*  MCV 95.5  PLT 107*   :   Assessment & Plan by Problem:  1.  End-stage disease.  Patient presented for hemodialysis, with potassium of 5.8 and mild dyspnea.  The plan is hemodialysis and disposition as per the nephrology consult service.  He will follow up with his physician and dialysis Center in [place] after discharge.  2.  Hypertension.  Anticipate that blood pressure will improve following hemodialysis; if it is persistently elevated, then he may need  adjustment of his antihypertensive regimen with close followup.

## 2012-11-01 NOTE — ED Provider Notes (Signed)
History     CSN: [id number]  Arrival date & time 11/01/12  0800   First MD Initiated Contact with Patient 11/01/12 0809      Chief Complaint  Patient presents with  . Shortness of Breath    (Consider location/radiation/quality/duration/timing/severity/associated sxs/prior treatment) HPI Patient presents emergency department for dialysis.  The patient, states, that he is visiting from [place] and he could not get outpatient dialysis arranged due to the fact that the facility's do not except traveling patients.  Patient, states he's had some mild shortness of breath, which normally accompanies his need for dialysis.  Patient denies chest pain, fever, nausea, vomiting, abdominal pain, back pain, or syncope  Past Medical History  Diagnosis Date  . Renal insufficiency   . Hx of bladder repair surgery   . Hepatitis C   . Cocaine abuse in remission     Quit date in 1995  . Alcohol abuse     quit date in 1995    Past Surgical History  Procedure Laterality Date  . Hernia repair      No family history on file.  History  Substance Use Topics  . Smoking status: Former Smoker    Quit date: 05/28/1993  . Smokeless tobacco: Not on file  . Alcohol Use: No      Review of Systems All other systems negative except as documented in the HPI. All pertinent positives and negatives as reviewed in the HPI. Allergies  Heparin; Pork-derived products; Sulfa antibiotics; and Sulfonamide derivatives  Home Medications   Current Outpatient Rx  Name  Route  Sig  Dispense  Refill  . amLODipine (NORVASC) 10 MG tablet   Oral   Take 10 mg by mouth daily.         . calcium acetate (PHOSLO) 667 MG capsule   Oral   Take 667 mg by mouth 3 (three) times daily with meals.          . cinacalcet (SENSIPAR) 60 MG tablet   Oral   Take 120 mg by mouth daily.         [name] epoetin alfa (EPOGEN,PROCRIT) 82956 UNIT/ML injection   Subcutaneous   Inject 10,000 Units into the skin once a week.  Dialysis on Tuesday, Thursday, and Saturday         . oxycodone (ROXICODONE) 30 MG immediate release tablet   Oral   Take 30 mg by mouth 3 (three) times daily.           BP 162/89  Pulse 78  Temp(Src) 98.5 F (36.9 C) (Oral)  Resp 18  SpO2 95%  Physical Exam  Nursing note and vitals reviewed. Constitutional: He is oriented to person, place, and time. He appears well-developed and well-nourished. No distress.  HENT:  Head: Normocephalic and atraumatic.  Mouth/Throat: Oropharynx is clear and moist.  Cardiovascular: Normal rate, regular rhythm and normal heart sounds.  Exam reveals no gallop and no friction rub.   No murmur heard. Pulmonary/Chest: Effort normal and breath sounds normal. No respiratory distress.  Neurological: He is alert and oriented to person, place, and time.  Skin: Skin is warm and dry.    ED Course  Procedures (including critical care time)  Labs Reviewed  CBC - Abnormal; Notable for the following:    RBC 3.14 (*)    Hemoglobin 9.7 (*)    HCT 30.0 (*)    RDW 16.2 (*)    Platelets 107 (*)    All other components within normal limits  BASIC METABOLIC PANEL   I spoke with Dr. Jonnie Finner with nephrology, who will be down to see the patient.  Patient be admitted by the internal medicine resident for dialysis  1. Admission for dialysis       [place], PA-C 11/01/12 1215

## 2012-11-01 NOTE — ED Notes (Signed)
MD here to see pt.

## 2012-11-01 NOTE — Progress Notes (Signed)
Pt arrived to dialysis from ED via stretcher for tx. Pt is from [facility] and is here visiting and was unable to set up dialysis at an outpt center. Pt dialyzed without issues. D/c'd post tx, ambulatory and in stable condition. No SOB noted. Pt plans to travel back to [facility] tomorrow and go to his regularly scheduled tx on Tuesday.

## 2012-11-01 NOTE — ED Provider Notes (Signed)
Medical screening examination/treatment/procedure(s) were conducted as a shared visit with non-physician practitioner(s) and myself.  I personally evaluated the patient during the encounter   Julianne Rice, MD 11/01/12 1322

## 2012-11-01 NOTE — ED Notes (Addendum)
Pt stated SOB started this early morning, needing dialysis done, was last diaylsied on Tuesday and he is tue, thur and sat dialysis schedule days. Pt stated was visiting daughter Logan Martin who is in the hospital and need dialysis but was unable and was told by his center [facility] in [place] to go to the nearest ED that is why I am here now" He said called [facility] and was told by the social worker to come to Ed this morning.

## 2012-11-01 NOTE — Progress Notes (Signed)
  Brief progressive note  Patient was admitted today mainly for hemodialysis. He missed his HD on Thursday (regular HD on T/Thur/S) due to traveling from [place] to [place]. Due to SOB, he come to ED. Renal was consulted for HD by ED. We were asked to have admitted patient to [facility]. Patient was seen by Dr. Jonnie Finner for HD. He completed his full course of HD today. He did not go back to floor, instead he left hospital and went to his daughter's home without telling any body. I called him and spoke with him on the phone. He reports that he is doing fine. His SOB resolved. I then called Dr. Jonnie Finner who also did not know that patient left by himself. Dr. Jonnie Finner said that patient's SOB resolved after HD and thinks that patient is fine, and no need to worry about. On the phone, patient told me that he does not need any medication prescriptions and is doing well.   Ivor Costa, MD PGY2, Internal Medicine Teaching Service Pager: [phone]

## 2012-11-01 NOTE — Consult Note (Signed)
Logan Martin 11/01/2012 [place] [facility] Requesting Physician:  Dr Lita Mains  Reason for Consult:  ESRD pt out of town here to visit family, needs HD HPI: The patient is a 57 y.o. year-old with hx of HTN, hep C and ESRD on HD in [place]. Used to dialyze in [place] at [facility].  Tried to establish HD here as a transient but said the units he called told him they "don't do transients anymore".  Presented to ED, +SOB, needs dialysis.   ROS  no fever  + SOB no cough  no cp  no n/v/d  no abd pain  no ext swelling  no confusion or HA   Past Medical History:  Past Medical History  Diagnosis Date  . Renal insufficiency   . Hx of bladder repair surgery   . Hepatitis C   . Cocaine abuse in remission     Quit date in 1995  . Alcohol abuse     quit date in 1995    Past Surgical History:  Past Surgical History  Procedure Laterality Date  . Hernia repair      Family History: No family history on file. Social History:  reports that he quit smoking about 19 years ago. He does not have any smokeless tobacco history on file. He reports that he does not drink alcohol or use illicit drugs.  Allergies:  Allergies  Allergen Reactions  . Heparin Other (See Comments)    Pork.   . Pork-Derived Products   . Sulfa Antibiotics Rash  . Sulfonamide Derivatives Rash    Home medications: Prior to Admission medications   Medication Sig Start Date End Date Taking? Authorizing Provider  amLODipine (NORVASC) 10 MG tablet Take 10 mg by mouth daily.   Yes Historical Provider, MD  calcium acetate (PHOSLO) 667 MG capsule Take 667 mg by mouth 3 (three) times daily with meals.    Yes Historical Provider, MD  cinacalcet (SENSIPAR) 60 MG tablet Take 120 mg by mouth daily.   Yes Historical Provider, MD  epoetin alfa (EPOGEN,PROCRIT) 09811 UNIT/ML injection Inject 10,000 Units into the skin once a week. Dialysis on Tuesday, Thursday, and Saturday   Yes Historical Provider, MD  oxycodone (ROXICODONE) 30 MG  immediate release tablet Take 30 mg by mouth 3 (three) times daily.   Yes Historical Provider, MD    Labs: Liver Function Tests: No results found for this basename: AST, ALT, ALKPHOS, BILITOT, PROT, ALBUMIN,  in the last 168 hours No results found for this basename: LIPASE, AMYLASE,  in the last 168 hours PT/INR: @LABRCNTIP (inr:5) Cardiac Enzymes: )No results found for this basename: TROPONINI,  in the last 168 hours CBG: No results found for this basename: GLUCAP,  in the last 168 hours CBC  Recent Labs Lab 11/01/12 0941  WBC 4.2  HGB 9.7*  HCT 30.0*  MCV 95.5  PLT [id number]*   Basic Metabolic Panel:  Recent Labs Lab 11/01/12 0941  NA 141  K 5.8*  CL 105  CO2 23  GLUCOSE 74  BUN 60*  CREATININE 12.76*  CALCIUM 8.3*    Physical Exam:  Blood pressure 147/87, pulse 78, temperature 98.5 F (36.9 C), temperature source Oral, resp. rate 18, SpO2 95.00%. Gen: alert, no distress HEENT:  EOMI, sclera anicteric, throat clear Neck: + JVD, no LAN Chest: crackles R base, L clear CV: regular, no rub or gallop, transmitted bruit from the RUA access, no carotid or femoral bruits, pedal pulses good Abdomen: soft, nontender, scaphoid, no ascites Ext:  no LE edema , no joint effusion or deformity, no gangrene or ulceration Neuro: alert, Ox3, no focal deficit Access: RUA AVF with large aneurysmal dilatation  Outpatient HD: HD in [place], 4hrs, dry wt 57.5 Kg.  Does not use heparin (religious preference).     Impression 1. SOB- prob early vol overload 2. ESRD, out of town patient, usual HD TTS. Last HD 2 days ago.  3. HTN/volume- takes norvasc, BP ok 4. Anemia of CKD- Hb 9.7  5. Secondary HPTH- takes sensipar and phoslo, continue 6. Hep C 7. Mild hyperkalemia  Rec- HD today upstairs, UF to dry wt. See orders.  Should be able to go home after HD.  He is returning to [facility] tomorrow by train.   Kelly Splinter  MD [facility] [phone] pgr    [phone] cell 11/01/2012,  12:35 PM

## 2012-11-01 NOTE — Discharge Summary (Signed)
Internal [facility] Discharge Note  Name: Logan Martin MRN: [id number] DOB: [birth year] 57 y.o.  Date of Admission: 11/01/2012  8:01 AM Date of Discharge: 11/01/2012 Attending Physician: No att. providers found  Discharge Diagnosis: Principal Problem:   ESRD on dialysis Active Problems:   CORONARY ARTERY DISEASE   GERD   Secondary hyperparathyroidism (of renal origin)   Anemia associated with chronic renal failure   Discharge Medications:   Medication List    ASK your doctor about these medications       amLODipine 10 MG tablet  Commonly known as:  NORVASC  Take 10 mg by mouth daily.     calcium acetate 667 MG capsule  Commonly known as:  PHOSLO  Take 667 mg by mouth 3 (three) times daily with meals.     cinacalcet 60 MG tablet  Commonly known as:  SENSIPAR  Take 120 mg by mouth daily.     epoetin alfa 10000 UNIT/ML injection  Commonly known as:  EPOGEN,PROCRIT  Inject 10,000 Units into the skin once a week. Dialysis on Tuesday, Thursday, and Saturday     oxycodone 30 MG immediate release tablet  Commonly known as:  ROXICODONE  Take 30 mg by mouth 3 (three) times daily.        Disposition and follow-up:   Logan Martin was discharged from [facility] in Stable condition.  At the hospital follow up visit please address: 1. HD access to care 2. Bmet for electrolyte abnormalities  Follow-up Appointments:   Consultations: Treatment Team:  Sol Blazing, MD  Procedures Performed:  No results found.   Admission HPI: [name] is a 57 yo AA man pmh reflux nephropathy HD dependent, Hep C, former polysubstance abuse including cocaine and EtOH, and CAD presents for dialysis. Pt was prevgiously established at [facility] for HD but recently moved to [facility]. Pt revisting [place] given daughter's hospitalization at [facility]. Pt had tried to arrange HD given that he was previously established in [place] but was not able to be  accepted by the center. In [place] pt gets dialyzed THS but due to the inability to set up dialysis in [place] pt missed session on thrusday. Pt only symptoms include dyspnea but denied CP, DOE, PND, LE edema, nausea/vomiting/diarrhea, abdominal pain, fever/chills, cough, HA, or blurry vision. Pt was compliant with medications and is set to ride the train back to [facility] on 11/02/12 and very anxious to leave after dialysis.   Hospital Course by problem list: #SOB: Pt not hypoxic or tachycardic. Revised geneva score 1 making PE less likely and no warrant for imaging at this time. This likely 2/2 to missed dialysis session. Patient was seen by Dr. Jonnie Martin for HD. He completed his full course of HD but then pt did not go back to floor. Pt left the hospital and went to his daughter's home without informing staff. Pt was called and reported that he is doing fine with full resolution of his SOB. Dr. Jonnie Martin was then informed of the pt leaving the hospital since he also was unaware of this. Dr. Jonnie Martin had been able to exam the patient after HD and confirmed that his SOB resolved after HD and that patient didn't need further evaluation.    # Reflux nephropathy HD dependent: Pt HD dependent since 1990s 2/2 vestibulourethral reflux. Some asymptomatic hyperkalemia 5.8. Pt left hospital and therefore f/u labs were not able to be obtained.   Discharge Vitals:  BP 187/99  Pulse 72  Temp(Src) 98.2 F (36.8 C) (Oral)  Resp 16  Wt 124 lb 9 oz (56.5 kg)  BMI 21.37 kg/m2  SpO2 100% Pt was not available for PE.   Discharge Labs:  Results for orders placed during the hospital encounter of 11/01/12 (from the past 24 hour(s))  CBC     Status: Abnormal   Collection Time    11/01/12  9:41 AM      Result Value Range   WBC 4.2  4.0 - 10.5 K/uL   RBC 3.14 (*) 4.22 - 5.81 MIL/uL   Hemoglobin 9.7 (*) 13.0 - 17.0 g/dL   HCT 30.0 (*) 39.0 - 52.0 %   MCV 95.5  78.0 - 100.0 fL   MCH 30.9  26.0 - 34.0 pg   MCHC 32.3  30.0 - 36.0 g/dL    RDW 16.2 (*) 11.5 - 15.5 %   Platelets 107 (*) 150 - 400 K/uL  BASIC METABOLIC PANEL     Status: Abnormal   Collection Time    11/01/12  9:41 AM      Result Value Range   Sodium 141  135 - 145 mEq/L   Potassium 5.8 (*) 3.5 - 5.1 mEq/L   Chloride 105  96 - 112 mEq/L   CO2 23  19 - 32 mEq/L   Glucose, Bld 74  70 - 99 mg/dL   BUN 60 (*) 6 - 23 mg/dL   Creatinine, Ser 12.76 (*) 0.50 - 1.35 mg/dL   Calcium 8.3 (*) 8.4 - 10.5 mg/dL   GFR calc non Af Amer 4 (*) >90 mL/min   GFR calc Af Amer 4 (*) >90 mL/min  HEPATITIS B SURFACE ANTIGEN     Status: None   Collection Time    11/01/12  1:39 PM      Result Value Range   Hepatitis B Surface Ag NEGATIVE  NEGATIVE    Signed: Clinton Gallant 11/01/2012, 6:52 PM   Time Spent on Discharge: <30 min Services Ordered on Discharge: none Equipment Ordered on Discharge: none

## 2012-11-01 NOTE — H&P (Signed)
Hospital Admission Note Date: 11/01/2012  Patient name: Logan Martin Medical record number: [id number] Date of birth: 27-Jan-1956 Age: 57 y.o. Gender: male PCP: Lucrezia Starch, MD  Medical Service: IMTS  Attending physician: Dr. Marinda Elk    1st Contact: Dr. Algis Liming   Pager: [phone] 2nd Contact: Dr. Blaine Hamper    Pager: [phone] After 5 pm or weekends: 1st Contact:      Pager: [phone] 2nd Contact:      Pager: [phone]  Chief Complaint: need for dialysis  History of Present Illness: Logan Martin is a 57 yo AA man pmh reflux nephropathy HD dependent, Hep C, former polysubstance abuse including cocaine and EtOH, and CAD presents for dialysis. Pt was prevgiously established at [facility] for HD but recently moved to [facility]. Pt revisting [place] given daughter's hospitalization at [facility]. Pt had tried to arrange HD given that he was previously established in [place] but was not able to be accepted by the center. In [place] pt gets dialyzed THS but due to the inability to set up dialysis in [place] pt missed session on thrusday. Pt only symptoms include dyspnea but denied CP, DOE, PND, LE edema, nausea/vomiting/diarrhea, abdominal pain, fever/chills, cough, HA, or blurry vision. Pt was compliant with medications and is set to ride the train back to [facility] on 11/02/12 and very anxious to leave after dialysis.   Meds: No current facility-administered medications for this encounter.   Current Outpatient Prescriptions  Medication Sig Dispense Refill  . amLODipine (NORVASC) 10 MG tablet Take 10 mg by mouth daily.      . calcium acetate (PHOSLO) 667 MG capsule Take 667 mg by mouth 3 (three) times daily with meals.       . cinacalcet (SENSIPAR) 60 MG tablet Take 120 mg by mouth daily.      [name] epoetin alfa (EPOGEN,PROCRIT) 09811 UNIT/ML injection Inject 10,000 Units into the skin once a week. Dialysis on Tuesday, Thursday, and Saturday      . oxycodone (ROXICODONE) 30 MG immediate release tablet Take 30 mg by mouth 3 (three) times  daily.        Allergies: Allergies as of 11/01/2012 - Review Complete 11/01/2012  Allergen Reaction Noted  . Heparin Other (See Comments) 11/19/2011  . Pork-derived products  08/04/2012  . Sulfa antibiotics Rash 11/22/2011  . Sulfonamide derivatives Rash    Past Medical History  Diagnosis Date  . Renal insufficiency   . Hx of bladder repair surgery   . Hepatitis C   . Cocaine abuse in remission     Quit date in 1995  . Alcohol abuse     quit date in 1995   Past Surgical History  Procedure Laterality Date  . Hernia repair     No family history on file. History   Social History  . Marital Status: Single    Spouse Name: N/A    Number of Children: N/A  . Years of Education: N/A   Occupational History  . Not on file.   Social History Main Topics  . Smoking status: Former Smoker    Quit date: 05/28/1993  . Smokeless tobacco: Not on file  . Alcohol Use: No  . Drug Use: No  . Sexually Active: Not on file   Other Topics Concern  . Not on file   Social History Narrative  . No narrative on file    Review of Systems: A comprehensive review of systems was negative.  Physical Exam: Blood pressure 147/87, pulse 78, temperature 98.5 F (  36.9 C), temperature source Oral, resp. rate 18, SpO2 95.00%. General: resting in bed, thin, NAD HEENT: PERRL, EOMI, no scleral icterus Cardiac: RRR, no rubs, murmurs or gallops Pulm: clear to auscultation bilaterally, no crackles or wheezes, moving normal volumes of air Abd: soft, nontender, nondistended, BS present Ext: warm and well perfused, no pedal edema, marked clubbing of hands bilaterally Neuro: alert and oriented X3, cranial nerves II-XII grossly intact  Lab results: Basic Metabolic Panel:  Recent Labs  11/01/12 0941  NA 141  K 5.8*  CL 105  CO2 23  GLUCOSE 74  BUN 60*  CREATININE 12.76*  CALCIUM 8.3*   CBC:  Recent Labs  11/01/12 0941  WBC 4.2  HGB 9.7*  HCT 30.0*  MCV 95.5  PLT 107*   Assessment &  Plan by Problem: #SOB: Pt not hypoxic or tachycardic. Revised geneva score 1 making PE less likely and no warrant for imaging at this time. This likely 2/2 to missed dialysis session.  -HD -cont to monitor  # Reflux nephropathy HD dependent: Pt HD dependent since 1990s 2/2 vestibulourethral reflux. Some asymptomatic hyperkalemia 5.8.  -HD   Dispo: Disposition is deferred at this time, awaiting improvement of current medical problems. Anticipated discharge in approximately 1 day(s).   The patient does not have a current PCP (DUNHAM,CYNTHIA B, MD), therefore will be requiring [facility]PC follow-up after discharge.   The patient does not have transportation limitations that hinder transportation to clinic appointments.  Signed: Clinton Gallant 11/01/2012, 12:10 PM  Pgr: [phone]

## 2012-11-26 ENCOUNTER — Encounter (HOSPITAL_COMMUNITY): Payer: Self-pay

## 2012-11-26 ENCOUNTER — Emergency Department (HOSPITAL_COMMUNITY)
Admission: EM | Admit: 2012-11-26 | Discharge: 2012-11-26 | Disposition: A | Discharge status: Home / Self Care | Payer: Medicare Other | Source: Home / Self Care | Attending: Emergency Medicine | Admitting: Emergency Medicine

## 2012-11-26 ENCOUNTER — Emergency Department (HOSPITAL_COMMUNITY): Payer: Medicare Other

## 2012-11-26 DIAGNOSIS — E875 Hyperkalemia: Secondary | ICD-10-CM | POA: Diagnosis present

## 2012-11-26 DIAGNOSIS — K219 Gastro-esophageal reflux disease without esophagitis: Secondary | ICD-10-CM | POA: Diagnosis present

## 2012-11-26 DIAGNOSIS — B1920 Unspecified viral hepatitis C without hepatic coma: Secondary | ICD-10-CM | POA: Insufficient documentation

## 2012-11-26 DIAGNOSIS — Z86711 Personal history of pulmonary embolism: Secondary | ICD-10-CM | POA: Insufficient documentation

## 2012-11-26 DIAGNOSIS — Z79899 Other long term (current) drug therapy: Secondary | ICD-10-CM | POA: Insufficient documentation

## 2012-11-26 DIAGNOSIS — N186 End stage renal disease: Secondary | ICD-10-CM | POA: Diagnosis present

## 2012-11-26 DIAGNOSIS — Z87891 Personal history of nicotine dependence: Secondary | ICD-10-CM

## 2012-11-26 DIAGNOSIS — D631 Anemia in chronic kidney disease: Secondary | ICD-10-CM | POA: Diagnosis present

## 2012-11-26 DIAGNOSIS — Z992 Dependence on renal dialysis: Secondary | ICD-10-CM | POA: Insufficient documentation

## 2012-11-26 DIAGNOSIS — Z9889 Other specified postprocedural states: Secondary | ICD-10-CM | POA: Insufficient documentation

## 2012-11-26 DIAGNOSIS — E872 Acidosis, unspecified: Secondary | ICD-10-CM | POA: Diagnosis present

## 2012-11-26 DIAGNOSIS — I120 Hypertensive chronic kidney disease with stage 5 chronic kidney disease or end stage renal disease: Principal | ICD-10-CM | POA: Diagnosis present

## 2012-11-26 DIAGNOSIS — Z91199 Patient's noncompliance with other medical treatment and regimen due to unspecified reason: Secondary | ICD-10-CM

## 2012-11-26 DIAGNOSIS — F1010 Alcohol abuse, uncomplicated: Secondary | ICD-10-CM | POA: Insufficient documentation

## 2012-11-26 DIAGNOSIS — F1411 Cocaine abuse, in remission: Secondary | ICD-10-CM | POA: Insufficient documentation

## 2012-11-26 DIAGNOSIS — N039 Chronic nephritic syndrome with unspecified morphologic changes: Secondary | ICD-10-CM | POA: Diagnosis present

## 2012-11-26 DIAGNOSIS — Z9119 Patient's noncompliance with other medical treatment and regimen: Secondary | ICD-10-CM

## 2012-11-26 DIAGNOSIS — N2581 Secondary hyperparathyroidism of renal origin: Secondary | ICD-10-CM | POA: Diagnosis present

## 2012-11-26 LAB — COMPREHENSIVE METABOLIC PANEL
ALT: 8 U/L (ref 0–53)
AST: 10 U/L (ref 0–37)
Albumin: 3.5 g/dL (ref 3.5–5.2)
Alkaline Phosphatase: 90 U/L (ref 39–117)
BUN: 60 mg/dL — ABNORMAL HIGH (ref 6–23)
CO2: 21 mEq/L (ref 19–32)
Calcium: 7.7 mg/dL — ABNORMAL LOW (ref 8.4–10.5)
Chloride: 98 mEq/L (ref 96–112)
Creatinine, Ser: 13.72 mg/dL — ABNORMAL HIGH (ref 0.50–1.35)
GFR calc Af Amer: 4 mL/min — ABNORMAL LOW (ref >90)
GFR calc non Af Amer: 3 mL/min — ABNORMAL LOW (ref >90)
Glucose, Bld: 89 mg/dL (ref 70–99)
Potassium: 4.6 mEq/L (ref 3.5–5.1)
Sodium: 135 mEq/L (ref 135–145)
Total Bilirubin: 0.4 mg/dL (ref 0.3–1.2)
Total Protein: 6.8 g/dL (ref 6.0–8.3)

## 2012-11-26 LAB — CBC
HCT: 32.5 % — ABNORMAL LOW (ref 39.0–52.0)
Hemoglobin: 10.5 g/dL — ABNORMAL LOW (ref 13.0–17.0)
MCH: 30.2 pg (ref 26.0–34.0)
MCHC: 32.3 g/dL (ref 30.0–36.0)
MCV: 93.4 fL (ref 78.0–100.0)
Platelets: 134 10*3/uL — ABNORMAL LOW (ref 150–400)
RBC: 3.48 MIL/uL — ABNORMAL LOW (ref 4.22–5.81)
RDW: 16.6 % — ABNORMAL HIGH (ref 11.5–15.5)
WBC: 3.8 10*3/uL — ABNORMAL LOW (ref 4.0–10.5)

## 2012-11-26 NOTE — ED Notes (Signed)
Pt not answering to take back to room

## 2012-11-26 NOTE — ED Provider Notes (Signed)
History    57 year old male presenting for dialysis. End-stage renal disease. Patient is from out of town. Currently visiting from [place]. Was getting dialysis at the center [facility]. Was dialyzed on Wednesday and Friday.Have dialysis yesterday, but missed his appointment. He tried going today and they told him that he was dismissed.Complaining of mild shortness of breath. No other acute complaints. Patient states that he is traveling back to [place] tomorrow and is going to be going by bus and train. He suspects that this will take in much the day and won't get dialysis again until Thursday at the earliest.   CSN: [id number] Arrival date & time 11/26/12  1736  First MD Initiated Contact with Patient 11/26/12 1842     Chief Complaint  Patient presents with  . Vascular Access Problem  . Shortness of Breath   (Consider location/radiation/quality/duration/timing/severity/associated sxs/prior Treatment) HPI Past Medical History  Diagnosis Date  . Hx of bladder repair surgery   . Hepatitis C   . Cocaine abuse in remission     Quit date in 1995  . Alcohol abuse     quit date in 1995  . 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 Started HD in [place].  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  On [facility] home hemo since 2010. Using RUA AVF.     Past Surgical History  Procedure Laterality Date  . Hernia repair     History reviewed. No pertinent family history. History  Substance Use Topics  . Smoking status: Former Smoker    Quit date: 05/28/1993  . Smokeless tobacco: Not on file  . Alcohol Use: No    Review of Systems  All systems reviewed and negative, other than as noted in HPI.   Allergies  Heparin; Pork-derived products; Sulfa antibiotics; and Sulfonamide derivatives  Home Medications   Current Outpatient Rx  Name  Route  Sig  Dispense  Refill  . amLODipine (NORVASC) 10 MG tablet   Oral   Take 10 mg by  mouth daily.         . calcium acetate (PHOSLO) 667 MG capsule   Oral   Take 667 mg by mouth 5 (five) times daily. Takes 1 cap with 3 meals, 1 cap with 2 snacks         . cinacalcet (SENSIPAR) 60 MG tablet   Oral   Take 60 mg by mouth daily.          [name] oxycodone (ROXICODONE) 30 MG immediate release tablet   Oral   Take 30 mg by mouth 3 (three) times daily.          BP 140/72  Pulse 85  Temp(Src) 98.2 F (36.8 C) (Oral)  Resp 20  SpO2 96% Physical Exam  Nursing note and vitals reviewed. Constitutional: He appears well-developed and well-nourished. No distress.  HENT:  Head: Normocephalic and atraumatic.  Eyes: Conjunctivae are normal. Right eye exhibits no discharge. Left eye exhibits no discharge.  Neck: Neck supple.  Cardiovascular: Normal rate, regular rhythm and normal heart sounds.  Exam reveals no gallop and no friction rub.   No murmur heard. Fistula RUE with thrill  Pulmonary/Chest: Effort normal and breath sounds normal. No respiratory distress.  No increased WOB. Speaking in complete sentences. Lungs clear.   Abdominal: Soft. He exhibits no distension. There is no tenderness.  Musculoskeletal: He exhibits no edema and no tenderness.  Neurological: He is alert.  Skin: Skin  is warm and dry.  Psychiatric: He has a normal mood and affect. His behavior is normal. Thought content normal.    ED Course  Procedures (including critical care time) Labs Reviewed  CBC - Abnormal; Notable for the following:    WBC 3.8 (*)    RBC 3.48 (*)    Hemoglobin 10.5 (*)    HCT 32.5 (*)    RDW 16.6 (*)    Platelets 134 (*)    All other components within normal limits  COMPREHENSIVE METABOLIC PANEL - Abnormal; Notable for the following:    BUN 60 (*)    Creatinine, Ser 13.72 (*)    Calcium 7.7 (*)    GFR calc non Af Amer 3 (*)    GFR calc Af Amer 4 (*)    All other components within normal limits   Dg Chest 2 View  11/26/2012   *RADIOLOGY REPORT*  Clinical Data:  Shortness of breath.  CHEST - 2 VIEW  Comparison: PA and lateral chest 08/04/2012 and 12/05/2010.  Findings: There is cardiomegaly and mild vascular congestion. Trace bilateral pleural effusions are present.  The chest is hyperexpanded with flattening of the hemidiaphragms and enlargement retrosternal air space.  No focal bony abnormality is identified.  IMPRESSION:  1.  Cardiomegaly and pulmonary vascular congestion. 2.  Trace bilateral pleural effusions. 3.  Pulmonary hyperexpansion compatible with emphysema.   Original Report Authenticated By: Orlean Patten, M.D.   1. ESRD (end stage renal disease)     MDM  [phone] presenting for dialysis. Last had Friday. C/o mild SOB, but no distress on exam. Lytes ok. CXR w/ mild congestion.  8:21 PM Discussed with Dr Mercy Moore. No emergent need to dialyze.   Virgel Manifold, MD 12/01/12 1004

## 2012-11-26 NOTE — ED Notes (Signed)
Pt reports he is visiting from [place], went to dialysis center today, they told him they d/c him from their facility this Tuesday and were unable to do dialysis today for him, they instructed pt to come to ED. Pt's last dialysis treatment was last Friday. Pt c/o SOB and weakness. Pt is scheduled to take a train back to [place] tomorrow and is requesting dialysis.

## 2012-11-27 ENCOUNTER — Inpatient Hospital Stay (HOSPITAL_COMMUNITY)
Admission: EM | Admit: 2012-11-27 | Discharge: 2012-11-28 | DRG: 682 | Disposition: A | Discharge status: Home / Self Care | Payer: Medicare Other | Attending: Internal Medicine | Admitting: Internal Medicine

## 2012-11-27 ENCOUNTER — Inpatient Hospital Stay (HOSPITAL_COMMUNITY): Payer: Medicare Other

## 2012-11-27 ENCOUNTER — Emergency Department (HOSPITAL_COMMUNITY): Payer: Medicare Other

## 2012-11-27 ENCOUNTER — Encounter (HOSPITAL_COMMUNITY): Payer: Self-pay | Admitting: Emergency Medicine

## 2012-11-27 DIAGNOSIS — D631 Anemia in chronic kidney disease: Secondary | ICD-10-CM | POA: Diagnosis present

## 2012-11-27 DIAGNOSIS — N189 Chronic kidney disease, unspecified: Secondary | ICD-10-CM | POA: Diagnosis present

## 2012-11-27 DIAGNOSIS — N186 End stage renal disease: Secondary | ICD-10-CM

## 2012-11-27 DIAGNOSIS — N2581 Secondary hyperparathyroidism of renal origin: Secondary | ICD-10-CM | POA: Diagnosis present

## 2012-11-27 DIAGNOSIS — R079 Chest pain, unspecified: Secondary | ICD-10-CM

## 2012-11-27 DIAGNOSIS — R0602 Shortness of breath: Secondary | ICD-10-CM

## 2012-11-27 DIAGNOSIS — R0789 Other chest pain: Secondary | ICD-10-CM | POA: Diagnosis present

## 2012-11-27 DIAGNOSIS — E875 Hyperkalemia: Secondary | ICD-10-CM

## 2012-11-27 HISTORY — DX: Essential (primary) hypertension: I10

## 2012-11-27 HISTORY — DX: Other pulmonary embolism without acute cor pulmonale: I26.99

## 2012-11-27 LAB — BASIC METABOLIC PANEL
CO2: 18 mEq/L — ABNORMAL LOW (ref 19–32)
Calcium: 7.6 mg/dL — ABNORMAL LOW (ref 8.4–10.5)
Chloride: 104 mEq/L (ref 96–112)
Glucose, Bld: 85 mg/dL (ref 70–99)
Potassium: 5.6 mEq/L — ABNORMAL HIGH (ref 3.5–5.1)
Sodium: 139 mEq/L (ref 135–145)

## 2012-11-27 LAB — MRSA PCR SCREENING: MRSA by PCR: NEGATIVE

## 2012-11-27 LAB — TROPONIN I: Troponin I: <0.3 ng/mL (ref <0.30)

## 2012-11-27 LAB — CBC
Hemoglobin: 9.9 g/dL — ABNORMAL LOW (ref 13.0–17.0)
MCH: 30.4 pg (ref 26.0–34.0)
RBC: 3.26 MIL/uL — ABNORMAL LOW (ref 4.22–5.81)
WBC: 3.8 10*3/uL — ABNORMAL LOW (ref 4.0–10.5)

## 2012-11-27 MED ORDER — LIDOCAINE-PRILOCAINE 2.5-2.5 % EX CREA: 1.0000 "application " | TOPICAL_CREAM | CUTANEOUS | Status: DC | PRN

## 2012-11-27 MED ORDER — PANTOPRAZOLE SODIUM 40 MG PO TBEC
40.0000 mg | DELAYED_RELEASE_TABLET | Freq: Every day | ORAL | Status: DC
  Administered 2012-11-27 – 2012-11-28 (×2): 40 mg via ORAL
  Filled 2012-11-27 (×2): qty 1

## 2012-11-27 MED ORDER — LIDOCAINE HCL (PF) 1 % IJ SOLN: 5.0000 mL | INTRAMUSCULAR | Status: DC | PRN

## 2012-11-27 MED ORDER — AMLODIPINE BESYLATE 10 MG PO TABS
10.0000 mg | ORAL_TABLET | Freq: Every day | ORAL | Status: DC
  Administered 2012-11-27 – 2012-11-28 (×2): 10 mg via ORAL
  Filled 2012-11-27 (×2): qty 1

## 2012-11-27 MED ORDER — ASPIRIN EC 81 MG PO TBEC
81.0000 mg | DELAYED_RELEASE_TABLET | Freq: Every day | ORAL | Status: DC
  Administered 2012-11-27 – 2012-11-28 (×2): 81 mg via ORAL
  Filled 2012-11-27 (×2): qty 1

## 2012-11-27 MED ORDER — ONDANSETRON HCL 4 MG/2ML IJ SOLN: 4.0000 mg | Freq: Four times a day (QID) | INTRAMUSCULAR | Status: DC | PRN

## 2012-11-27 MED ORDER — IOHEXOL 350 MG/ML SOLN
100.0000 mL | Freq: Once | INTRAVENOUS | Status: AC | PRN
  Administered 2012-11-27: 100 mL via INTRAVENOUS

## 2012-11-27 MED ORDER — PENTAFLUOROPROP-TETRAFLUOROETH EX AERO: 1.0000 "application " | INHALATION_SPRAY | CUTANEOUS | Status: DC | PRN

## 2012-11-27 MED ORDER — HEPARIN SODIUM (PORCINE) 1000 UNIT/ML DIALYSIS: 1000.0000 [IU] | INTRAMUSCULAR | Status: DC | PRN

## 2012-11-27 MED ORDER — CINACALCET HCL 30 MG PO TABS: 60.0000 mg | ORAL_TABLET | Freq: Every day | ORAL | Status: DC

## 2012-11-27 MED ORDER — ONDANSETRON HCL 4 MG PO TABS: 4.0000 mg | ORAL_TABLET | Freq: Four times a day (QID) | ORAL | Status: DC | PRN

## 2012-11-27 MED ORDER — ALTEPLASE 2 MG IJ SOLR: 2.0000 mg | Freq: Once | INTRAMUSCULAR | Status: DC | PRN

## 2012-11-27 MED ORDER — CALCIUM ACETATE 667 MG PO CAPS
667.0000 mg | ORAL_CAPSULE | Freq: Every day | ORAL | Status: DC
  Administered 2012-11-27 – 2012-11-28 (×4): 667 mg via ORAL
  Filled 2012-11-27 (×9): qty 1

## 2012-11-27 MED ORDER — SODIUM CHLORIDE 0.9 % IV SOLN: 100.0000 mL | INTRAVENOUS | Status: DC | PRN

## 2012-11-27 MED ORDER — NEPRO/CARBSTEADY PO LIQD: 237.0000 mL | ORAL | Status: DC | PRN

## 2012-11-27 MED ORDER — OXYCODONE HCL 5 MG PO TABS: 30.0000 mg | ORAL_TABLET | Freq: Three times a day (TID) | ORAL | Status: DC | PRN

## 2012-11-27 MED ORDER — ONDANSETRON 4 MG PO TBDP
4.0000 mg | ORAL_TABLET | Freq: Once | ORAL | Status: AC
  Administered 2012-11-27: 4 mg via ORAL
  Filled 2012-11-27: qty 1

## 2012-11-27 MED ORDER — CINACALCET HCL 30 MG PO TABS
60.0000 mg | ORAL_TABLET | Freq: Every day | ORAL | Status: DC
  Administered 2012-11-28: 60 mg via ORAL
  Filled 2012-11-27 (×2): qty 2

## 2012-11-27 MED ORDER — SODIUM CHLORIDE 0.9 % IJ SOLN
3.0000 mL | Freq: Two times a day (BID) | INTRAMUSCULAR | Status: DC
  Administered 2012-11-27 – 2012-11-28 (×3): 3 mL via INTRAVENOUS

## 2012-11-27 MED ORDER — SODIUM CHLORIDE 0.9 % IV SOLN
INTRAVENOUS | Status: AC
  Filled 2012-11-27: qty 50

## 2012-11-27 NOTE — ED Provider Notes (Signed)
History    CSN: [id number] Arrival date & time 11/27/12  [id number]  First MD Initiated Contact with Patient 11/27/12 0602     Chief Complaint  Patient presents with  . Shortness of Breath   (Consider location/radiation/quality/duration/timing/severity/associated sxs/prior Treatment) HPI Comments: Patient is a 57 year old man past medical history significant for hepatitis C, ESRD on dialysis Monday Wednesday Friday presenting to the emergency department for shortness of breath that began at 3 AM this morning with associated chest heaviness and nonproductive cough. Patient states his symptoms have been alleviated with 3 nitroglycerin he received in the ambulance. Patient states he has had associated nausea, nonbilious nonbloody vomiting and nonbloody diarrhea since yesterday, also feels his ankles are more swollen. Patient is supposed to receive dialysis Monday Wednesday Friday and has not been since last Friday he missed his appointment on Monday due to other plans. Patient is requesting to be dialyzed here in at [facility] since he has missed his last two appointments. Patient goes to [facility] dialysis. His nephrologist is Dr. Lorrene Reid. Patient does not make any urine on his own. Patient denies any fever or chills or chest pain.  Patient is a 57 y.o. male presenting with shortness of breath.  Shortness of Breath Associated symptoms: cough and vomiting   Associated symptoms: no chest pain and no fever      Past Medical History  Diagnosis Date  . Hx of bladder repair surgery   . Hepatitis C   . Cocaine abuse in remission     Quit date in 1995  . Alcohol abuse     quit date in 1995  . 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 Started HD in [place], [place].  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  On NxStage home hemo since 2010. Using RUA AVF.    [name] Hypertension    Past Surgical History  Procedure Laterality Date  . Hernia repair    . Bladder  surgery    . Av fistula placement     History reviewed. No pertinent family history. History  Substance Use Topics  . Smoking status: Former Smoker    Quit date: 05/28/1993  . Smokeless tobacco: Never Used  . Alcohol Use: No    Review of Systems  Constitutional: Negative for fever and chills.  Respiratory: Positive for cough and shortness of breath.   Cardiovascular: Negative for chest pain and palpitations.  Gastrointestinal: Positive for nausea, vomiting and diarrhea.  Genitourinary: Negative.   All other systems reviewed and are negative.    Allergies  Heparin; Pork-derived products; Sulfa antibiotics; and Sulfonamide derivatives  Home Medications   No current outpatient prescriptions on file. BP 155/85  Pulse 90  Temp(Src) 98.6 F (37 C) (Oral)  Resp 18  Ht 5\' 4"  (1.626 m)  Wt 128 lb (58.06 kg)  BMI 21.96 kg/m2  SpO2 100% Physical Exam  Constitutional: He is oriented to person, place, and time. He appears well-developed and well-nourished. No distress.  Sleeping comfortably on bed.  Patient talking in complete sentences without difficulty. Patient ambulated to restroom without difficulty or increased work of breathing.   HENT:  Head: Normocephalic and atraumatic.  Eyes: Conjunctivae are normal.  Neck: Neck supple.  Cardiovascular: Normal rate, regular rhythm and normal heart sounds.   Pulmonary/Chest: Effort normal and breath sounds normal. No respiratory distress. He has no rales. He exhibits no tenderness.  Abdominal: Soft. Bowel sounds are normal. There is no tenderness.  Musculoskeletal:  He exhibits no edema.  Neurological: He is alert and oriented to person, place, and time.  Skin: Skin is warm and dry. He is not diaphoretic. No erythema.    ED Course  Procedures (including critical care time) Labs Reviewed  CBC - Abnormal; Notable for the following:    WBC 3.8 (*)    RBC 3.26 (*)    Hemoglobin 9.9 (*)    HCT 30.2 (*)    RDW 16.4 (*)     Platelets 121 (*)    All other components within normal limits  BASIC METABOLIC PANEL - Abnormal; Notable for the following:    Potassium 5.6 (*)    CO2 18 (*)    BUN 68 (*)    Creatinine, Ser 15.27 (*)    Calcium 7.6 (*)    GFR calc non Af Amer 3 (*)    GFR calc Af Amer 4 (*)    All other components within normal limits  TROPONIN I  TROPONIN I  TROPONIN I  POCT I-STAT TROPONIN I    Date: 11/27/2012  Rate: 96  Rhythm: normal sinus rhythm  QRS Axis: normal  Intervals: normal  ST/T Wave abnormalities: normal  Conduction Disutrbances:none  Narrative Interpretation:   Old EKG Reviewed: unchanged   Dg Chest 2 View  11/27/2012   *RADIOLOGY REPORT*  Clinical Data: Dialysis patient.  Chest pressure and shortness of breath.  CHEST - 2 VIEW  Comparison: 11/26/2012  Findings: Artifact overlies chest.  The heart is enlarged.  There is venous hypertension with interstitial edema.  Tiny amount of fluid in the fissures.  No focal pulmonary lesion.  No significant bony finding.  IMPRESSION: Fluid overload/congestive heart failure.  Interstitial edema.   Original Report Authenticated By: Nelson Chimes, M.D.   Dg Chest 2 View  11/26/2012   *RADIOLOGY REPORT*  Clinical Data: Shortness of breath.  CHEST - 2 VIEW  Comparison: PA and lateral chest 08/04/2012 and 12/05/2010.  Findings: There is cardiomegaly and mild vascular congestion. Trace bilateral pleural effusions are present.  The chest is hyperexpanded with flattening of the hemidiaphragms and enlargement retrosternal air space.  No focal bony abnormality is identified.  IMPRESSION:  1.  Cardiomegaly and pulmonary vascular congestion. 2.  Trace bilateral pleural effusions. 3.  Pulmonary hyperexpansion compatible with emphysema.   Original Report Authenticated By: Orlean Patten, M.D.   1. Shortness of breath   2. Hyperkalemia   3. Encounter for hemodialysis for end-stage renal disease     MDM  Patient hyperkalemic without systemic signs or EKG  changes. Patient will require dialysis to alleviate hyperkalemia and shortness of breath. Hospitalist and nephrology consulted. Patient will be admitted for further management and dialysis. Discussed case with Dr. Sharol Given who agrees with my plan. The patient appears reasonably stabilized for admission considering the current resources, flow, and capabilities available in the ED at this time, and I doubt any other [facility] requiring further screening and/or treatment in the ED prior to admission.        Harlow Mares, PA-C 11/27/12 1208

## 2012-11-27 NOTE — H&P (Signed)
Date: 11/27/2012               Patient Name:  Logan Martin MRN: [id number]  DOB: 08-Jan-1956 Age / Sex: 57 y.o., male   PCP: Lucrezia Starch, MD         Medical Service: Internal Medicine Teaching Service         Attending Physician: Dr. Dominic Pea, DO    First Contact: Dr. Denton Brick Pager: [phone]  Second Contact: Dr. Burnard Bunting Pager: [phone]       After Hours (After 5p/  First Contact Pager: [phone]  weekends / holidays): Second Contact Pager: [phone]   Chief Complaint: Chest discomfort, shortness of breath  History of Present Illness:  The patient is a 57 YO man who has ESRD on dialysis, HTN, secondary hyperparathyroidism, hepatitis C, chronic anemia. He lives in [place] but has family in the area and comes frequently to [place]. He has a local kidney doctor, Dr. Lorrene Reid and gets dialysis at [facility] on M/W/F while in town. He states that he is in town for a brother's wedding and also has a daughter in town. He missed Monday's dialysis session because he was caught up in [place] and then showed up on Wednesday and was told that the dialysis center thought he had returned to [place] and cancelled his session so he was unable to dialyze. He started having some shortness of breath this morning around 3-4 AM and had chest discomfort with that. He is also having some nausea but no vomiting. He states he has had a cardiac catheterization in the past in 2002 and was told it was okay. He has also had some stress tests, the last of which was in 2011 and again he was told it was fine. These were all done in [place]. He denied any diaphoresis or chest pain and states the heaviness in his chest comes from the fluid when he does not dialyze. He denies abdominal pain, diarrhea, constipation. He does not make any urine. He has noticed swelling in his legs and states they are large since missing dialysis. He has not had a cough or sputum production. He denies fevers or chills. He denies any  numbness or weakness.   Of note, he was evaluated in the ED yesterday and no emergent need for dialysis was found.  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   Meds: No current facility-administered medications for this encounter.   Current Outpatient Prescriptions  Medication Sig Dispense Refill  . amLODipine (NORVASC) 10 MG tablet Take 10 mg by mouth daily.      . calcium acetate (PHOSLO) 667 MG capsule Take 667 mg by mouth 5 (five) times daily. Takes 1 cap with 3 meals, 1 cap with 2 snacks      . cinacalcet (SENSIPAR) 60 MG tablet Take 60 mg by mouth daily.       [name] omeprazole (PRILOSEC) 20 MG capsule Take 20 mg by mouth daily.      [name] oxycodone (ROXICODONE) 30 MG immediate release tablet Take 30 mg by mouth every 8 (eight) hours as needed.       . nitroGLYCERIN (NITROSTAT) 0.4 MG SL tablet Place 0.4 mg under the tongue every 5 (five) minutes as needed for chest pain (3 doses).        Allergies: Allergies as of 11/27/2012 - Review Complete 11/27/2012  Allergen Reaction Noted  . Heparin Rash 11/19/2011  .  Pork-derived products Rash 08/04/2012  . Sulfa antibiotics Rash 11/22/2011  . Sulfonamide derivatives Rash    Past Medical History  Diagnosis Date  . Hx of bladder repair surgery   . Hepatitis C   . Cocaine abuse in remission     Quit date in 1995  . Alcohol abuse     quit date in 1995  . 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 Started HD in [place], [place].  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  On NxStage home hemo since 2010. Using RUA AVF.     Past Surgical History  Procedure Laterality Date  . Hernia repair     No family history on file. History   Social History  . Marital Status: Single    Spouse Name: N/A    Number of Children: N/A  . Years of Education: N/A   Occupational History  . Not on file.   Social History Main Topics  . Smoking status:  Former Smoker    Quit date: 05/28/1993  . Smokeless tobacco: Not on file  . Alcohol Use: No  . Drug Use: No  . Sexually Active: Not on file   Other Topics Concern  . Not on file   Social History Narrative  . No narrative on file    Review of Systems: A comprehensive 10 point review of systems was performed and other than pertinent positives and negatives listed above in HPI was negative.   Physical Exam: Blood pressure 148/78, pulse 85, temperature 97.6 F (36.4 C), temperature source Oral, resp. rate 20, height 5\' 4"  (1.626 m), weight 128 lb (58.06 kg), SpO2 95.00%. General: resting in bed, sitting up with donut pillow around neck HEENT: PERRL, EOMI, no scleral icterus Cardiac: S1 S2 normal Pulm: minimal rales in the bases but otherwise clear to auscultation bilaterally, moving normal volumes of air Abd: soft, nontender, nondistended, BS present Ext: warm and well perfused, 2+ edema bilaterally to level of knee, no calf tenderness or redness, right extremity fistula with good thrill and bruit Neuro: alert and oriented X3, cranial nerves II-XII grossly intact, no focal weakness and 5/5 strength in legs and arms  Lab results: Basic Metabolic Panel:  Recent Labs  11/26/12 1748 11/27/12 0643  NA 135 139  K 4.6 5.6*  CL 98 104  CO2 21 18*  GLUCOSE 89 85  BUN 60* 68*  CREATININE 13.72* 15.27*  CALCIUM 7.7* 7.6*   Liver Function Tests:  Recent Labs  11/26/12 1748  AST 10  ALT 8  ALKPHOS 90  BILITOT 0.4  PROT 6.8  ALBUMIN 3.5   CBC:  Recent Labs  11/26/12 1748 11/27/12 0643  WBC 3.8* 3.8*  HGB 10.5* 9.9*  HCT 32.5* 30.2*  MCV 93.4 92.6  PLT 134* 121*   Cardiac Enzymes: POC troponin 0.03 (negative)  Imaging results:  Dg Chest 2 View  11/27/2012   *RADIOLOGY REPORT*  Clinical Data: Dialysis patient.  Chest pressure and shortness of breath.  CHEST - 2 VIEW  Comparison: 11/26/2012  Findings: Artifact overlies chest.  The heart is enlarged.  There is venous  hypertension with interstitial edema.  Tiny amount of fluid in the fissures.  No focal pulmonary lesion.  No significant bony finding.  IMPRESSION: Fluid overload/congestive heart failure.  Interstitial edema.   Original Report Authenticated By: Nelson Chimes, M.D.   Dg Chest 2 View  11/26/2012   *RADIOLOGY REPORT*  Clinical Data: Shortness of breath.  CHEST -  2 VIEW  Comparison: PA and lateral chest 08/04/2012 and 12/05/2010.  Findings: There is cardiomegaly and mild vascular congestion. Trace bilateral pleural effusions are present.  The chest is hyperexpanded with flattening of the hemidiaphragms and enlargement retrosternal air space.  No focal bony abnormality is identified.  IMPRESSION:  1.  Cardiomegaly and pulmonary vascular congestion. 2.  Trace bilateral pleural effusions. 3.  Pulmonary hyperexpansion compatible with emphysema.   Original Report Authenticated By: Orlean Patten, M.D.    Other results: EKG: unchanged from previous tracings, normal sinus rhythm, nonspecific ST and T waves changes, no peaked t waves, QTc 480 and stable from EKG 07/2012.  Assessment & Plan by Problem:   Chest discomfort - Unclear etiology although the patient states this happens with his volume overload. Will rule out ACS and start Aspirin daily. PE or dissection not as likely in this situation. TIMI 0. Sounds as though the patient is at risk for CAD. Most recent stress test per patient was in 2011 and was negative. Nitroglycerin relieved the pain in the EMS which could make ACS or exacerbation of GERD or esophageal etiology more likely. -Cycle CE times 3 -Observe on tele -Protonix for his GERD -Re-evaluate after dialysis and if recurrence of discomfort  Shortness of breath - Likely associated with lack of compliance to dialysis sessions, no pneumonia on CXR. No hypoxia or tachycardia in the ED making PE low on the differential. Wells score 1.5 (for prior hx of PE) which is low probability. Usage of D-dimer in  ESRD patients is uncertain and will not order this test at this time. If clinical likelihood improves then may need further evaluation. -In-patient dialysis -Will recheck patient after dialysis for symptom resolution -Monitor O2 sats and oxygen for comfort  ESRD on dialysis - Normally M/W/F dialysis at [facility] dialysis center and Dr. Lorrene Reid is his nephrologist in town. He plans to leave for [place] soon. Some metabolic acidosis and low bicarb level, some nausea that could be attributed to uremia, some shortness of breath with signs of systemic fluid burden, mild hyperkalemia to 5.6. Will likely need dialysis while in-patient and have consulted nephrology.  -In-patient dialysis -No treatment for K at this time if dialysis today (if no dialysis today may give dose of kayexylate) -Zofran prn nausea -Encouraged compliance with dialysis times and dates -Stressed potential negative outcomes associated with missing dialysis sessions  Secondary hyperparathyroidism (of renal origin) - Continue sinemet. Last PTH unknown.  Anemia associated with chronic renal failure - Hg 9.9 today which appears to be within the range of his normal. No active signs or symptoms of bleeding at this time. -Continue to monitor  DVT ppx - SCDs given renal function and platelets of 122.  Dispo: Disposition is deferred at this time, awaiting improvement of current medical problems. Anticipated discharge in approximately 1-2 day(s).   The patient does have a current PCP Lucrezia Starch, MD) and does not need an [facility] hospital follow-up appointment after discharge.  The patient does not have transportation limitations that hinder transportation to clinic appointments.  Signed: Olga Millers, MD 11/27/2012, 9:30 AM

## 2012-11-27 NOTE — Progress Notes (Signed)
  Echocardiogram 2D Echocardiogram has been performed.  Diamond Nickel 11/27/2012, 2:31 PM

## 2012-11-27 NOTE — Consult Note (Signed)
Logan Martin 11/27/2012 Logan Martin,Psalm D Requesting Physician: Dr Murlean Caller   Reason for Consult:  ESRD pt with SOB HPI: The patient is a 57 y.o. year-old AAM w hx of hep C, ESRD on HD in [place] on TTS schedule. Has been in [place] for a week or so, rec'd HD at [facility] on 6/25 and 6/27. Last HD 6/27  Presenting today w sob and chest heaviness. No CP   ROS  no f/c/d  no n/v/d   no abd pain  no ankle swelling  no fevers  no confusion  Past Medical History  Past Medical History  Diagnosis Date  . Hx of bladder repair surgery   . Hepatitis C   . Cocaine abuse in remission     Quit date in 1995  . Alcohol abuse     quit date in 1995  . 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 Started HD in [place], [place].  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  On NxStage home hemo since 2010. Using RUA AVF.      Past Surgical History  Past Surgical History  Procedure Laterality Date  . Hernia repair      Family History No family history on file. Social History  reports that he quit smoking about 19 years ago. He does not have any smokeless tobacco history on file. He reports that he does not drink alcohol or use illicit drugs.  Allergies  Allergies  Allergen Reactions  . Heparin Rash    Pork.   . Pork-Derived Products Rash  . Sulfa Antibiotics Rash  . Sulfonamide Derivatives Rash    Home medications Prior to Admission medications   Medication Sig Start Date End Date Taking? Authorizing Provider  amLODipine (NORVASC) 10 MG tablet Take 10 mg by mouth daily.   Yes Historical Provider, MD  calcium acetate (PHOSLO) 667 MG capsule Take 667 mg by mouth 5 (five) times daily. Takes 1 cap with 3 meals, 1 cap with 2 snacks   Yes Historical Provider, MD  cinacalcet (SENSIPAR) 60 MG tablet Take 60 mg by mouth daily.    Yes Historical Provider, MD  omeprazole (PRILOSEC) 20 MG capsule Take 20 mg by mouth daily.   Yes Historical Provider, MD  oxycodone  (ROXICODONE) 30 MG immediate release tablet Take 30 mg by mouth every 8 (eight) hours as needed.    Yes Historical Provider, MD  nitroGLYCERIN (NITROSTAT) 0.4 MG SL tablet Place 0.4 mg under the tongue every 5 (five) minutes as needed for chest pain (3 doses).    Historical Provider, MD    LABS Liver Function Tests  Recent Labs Lab 11/26/12 1748  AST 10  ALT 8  ALKPHOS 90  BILITOT 0.4  PROT 6.8  ALBUMIN 3.5   No results found for this basename: LIPASE, AMYLASE,  in the last 168 hours CBC  Recent Labs Lab 11/26/12 1748 11/27/12 0643  WBC 3.8* 3.8*  HGB 10.5* 9.9*  HCT 32.5* 30.2*  MCV 93.4 92.6  PLT 134* 123XX123*   Basic Metabolic Panel  Recent Labs Lab 11/26/12 1748 11/27/12 0643  NA 135 139  K 4.6 5.6*  CL 98 104  CO2 21 18*  GLUCOSE 89 85  BUN 60* 68*  CREATININE 13.72* 15.27*  CALCIUM 7.7* 7.6*    Physical Exam:  Blood pressure 148/78, pulse 85, temperature 97.6 F (36.4 C), temperature source Oral, resp. rate 20, height 5\' 4"  (1.626 m), weight 58.06 kg (128  lb), SpO2 95.00%. Gen: alert, dyspneic at rest HEENT:  EOMI, sclera anicteric, throat clear Neck: ++ JVD, no LAN Chest: bibasilar faint rales, no wheezing CV: regular, no rub or gallop, pedal pulses intact Abdomen: soft, nontender, no ascites Ext: no LE edema, no joint effusion or deformity, no gangrene or ulceration Neuro: alert, Ox3, no focal deficit Access: patent AVF  Outpatient HD (TTS [place], [place]) 4hrs  2.5Ca  Hep none  57.5kg EDW  RUE AVF EPO 8000  Impression 1. SOB/pulm edema due to vol excess, missed HD 2. ESRD, usual HD TTS in [place] 3. HTN/volume- as above 4. Anemia of CKD- on EPO  Plan- acute HD today. Should be OK for d/c after HD if stable   Kelly Splinter  MD Pager [phone]    Cell  [phone] 11/27/2012, 9:41 AM

## 2012-11-27 NOTE — H&P (Addendum)
INTERNAL MEDICINE TEACHING SERVICE Attending Admission Note  Date: 11/27/2012  Patient name: Logan Martin  Medical record number: [id number]  Date of birth: June 03, 1955    I have seen and evaluated [name] and discussed their care with the Residency Team.  62 yr. Old AAM w/ hx ESRD on HD M/W/F, HTN, hepatitis C, hx PE, hyperparathyroidism, anemia chronic disease, presented due to CP and SOB. He states he is currently visiting from [place] for a wedding. He has not had HD since last Friday and has been trying to compensate by drinking less fluids. He states he was awoken around 3 am by an episode of pressure like chest pain, associated with SOB and diaphoresis as well as nausea. He states he was given ASA and NTG by EMS and this immediately improved his chest discomfort (he does not like to use the word pain). He admits he's had a LHC over 10 yrs ago "that was ok", but no records have been obtained as of yet. He states he took a train here and was sitting down for about 2 days, he did not walk around very much. His Revised Geneva Score is 8, giving him a 28% probability of PE. He thought he just needed HD and the SOB/CP would resolve. He states he's had some swelling of his legs, but on exam he has trace edema bilat. CXR does show some evidence of interstitial edema. A CTA of the chest was obtained and no PE was found. He is currently CP free. His first two Trop I are negative. EKG on admission with old Q waves in anterior leads, but no ST changes. At this time his TIMI score is 2 (recent use of ASA, anginal CP), but may be higher if he has known >50% obstructive CAD by LHC performed in past. At this time, I would rule him out for ACS. Obtain TTE to look for wall motion abnormalities. Obtain a HgbA1C, Lipid panel. Aggressive risk factor modification, he is an ESRD patient. Cont ASA.  He will need to follow up with his PCP in [place]. Agree with HD today, he is CP free and hemodynamically stable.  Monitor on tele. Obtain records from previous [place]. If remains CP free, TTE w/o wall motion abnormalities, and negative CE's x 3, he can be discharged with outpatient follow up. Note that he was admitted in June 2014 with SOB but no CP, he had missed HD at that time as well.  Dominic Pea, [place] 7/3/20142:45 PM

## 2012-11-27 NOTE — Progress Notes (Signed)
Utilization review completed.  P.J. Anas Reister,RN,BSN Case Manager [phone]  

## 2012-11-27 NOTE — ED Provider Notes (Signed)
Medical screening examination/treatment/procedure(s) were performed by non-physician practitioner and as supervising physician I was immediately available for consultation/collaboration.  Kalman Drape, MD 11/27/12 [phone]

## 2012-11-27 NOTE — ED Notes (Signed)
Patient ambulated to bathroom and tolerated well.

## 2012-11-27 NOTE — Progress Notes (Signed)
11/27/2012  Patient transfer from the emergency room to 6700 at 1030. He is alert, oriented and ambulatory. He is from [place] and a dialysis patient. Normal dialysis days Monday, Wednesday and Friday. He has a right arm fistula and it is positive. Patient skin is fine, but dry also the heels. Patient was placed on telemetry and doing normal sinus rhythm. [facility] RN.

## 2012-11-27 NOTE — ED Notes (Signed)
Patient states he was having some chest pressure in ambulance, but resolved when they gave him NTG.

## 2012-11-27 NOTE — ED Notes (Signed)
Patient states he started having trouble breathing about 3 am today.   Patient states he has not been to dialysis since last Friday.   He is normally Mon, Wed, Fri dialysis.   Patient states he feels much better now that he had 3 x NTG in ambulance and had been put on non-rebreather.  Per EMS, patient could not tolerate CPAP.   Respiratory met patient in room on arrival.   Replaced non-rebreather with [place] at 2L.   Patient tolerating well at this time.

## 2012-11-28 LAB — RENAL FUNCTION PANEL
Calcium: 8.4 mg/dL (ref 8.4–10.5)
GFR calc Af Amer: 6 mL/min — ABNORMAL LOW (ref >90)
GFR calc non Af Amer: 5 mL/min — ABNORMAL LOW (ref >90)
Glucose, Bld: 82 mg/dL (ref 70–99)
Phosphorus: 4.8 mg/dL — ABNORMAL HIGH (ref 2.3–4.6)
Potassium: 4.6 mEq/L (ref 3.5–5.1)
Sodium: 141 mEq/L (ref 135–145)

## 2012-11-28 LAB — CBC
Hemoglobin: 10.6 g/dL — ABNORMAL LOW (ref 13.0–17.0)
MCH: 30.1 pg (ref 26.0–34.0)
MCHC: 32.6 g/dL (ref 30.0–36.0)
Platelets: 115 10*3/uL — ABNORMAL LOW (ref 150–400)
RBC: 3.52 MIL/uL — ABNORMAL LOW (ref 4.22–5.81)

## 2012-11-28 NOTE — Progress Notes (Signed)
Subjective:  No cos, feels better after hd yesterday  Objective Vital signs in last 24 hours: Filed Vitals:   11/27/12 2012 11/27/12 2101 11/28/12 0537 11/28/12 0730  BP: 192/87 140/94 151/75 141/69  Pulse: 66 78 92 87  Temp: 97.9 F (36.6 C) 98.4 F (36.9 C) 98.6 F (37 C) 98.4 F (36.9 C)  TempSrc: Oral Oral Oral Oral  Resp: 17 16 20 20   Height:      Weight: 55.6 kg (122 lb 9.2 oz)     SpO2: 99% 92% 94% 95%   Weight change: -1.66 kg (-3 lb 10.6 oz)  Intake/Output Summary (Last 24 hours) at 11/28/12 1116 Last data filed at 11/28/12 1100  Gross per 24 hour  Intake    240 ml  Output   3900 ml  Net  -3660 ml   Labs: Basic Metabolic Panel:  Recent Labs Lab 11/26/12 1748 11/27/12 0643 11/28/12 0500  NA 135 139 141  K 4.6 5.6* 4.6  CL 98 104 103  CO2 21 18* 23  GLUCOSE 89 85 82  BUN 60* 68* 39*  CREATININE 13.72* 15.27* 9.89*  CALCIUM 7.7* 7.6* 8.4  PHOS  --   --  4.8*   Liver Function Tests:  Recent Labs Lab 11/26/12 1748 11/28/12 0500  AST 10  --   ALT 8  --   ALKPHOS 90  --   BILITOT 0.4  --   PROT 6.8  --   ALBUMIN 3.5 3.4*     Recent Labs Lab 11/26/12 1748 11/27/12 0643 11/28/12 0500  WBC 3.8* 3.8* 3.4*  HGB 10.5* 9.9* 10.6*  HCT 32.5* 30.2* 32.5*  MCV 93.4 92.6 92.3  PLT 134* 121* 115*   Cardiac Enzymes:  Recent Labs Lab 11/27/12 1115 11/27/12 1653 11/27/12 2253  TROPONINI <0.30 <0.30 <0.30   Physical Exam: General: Alert,NAD BM Heart: RRR, Lungs: CTA Abdomen: bs pos. , soft ,nontender Extremities: Dialysis Access: no pedal edema. AVf pos. bruit   Outpatient HD (TTS [place], [place])  4hrs   2.5Ca   57.5kg EDW     RUE AVF    Hep none EPO 8000   Impression  1 SOB/pulm edema due to vol excess, missed HD; improved clinically, OK for discharge from renal standpoint. Has HD in [facility] tomorrow. 2 ESRD, usual HD TTS in [place] 3 HTN/volume- as above/ post hd below edw , dw pt needs new lower edw/ he will dw with his home unit 4 Anemia  of CKD- on EPO  Ernest Haber, PA-C [place] [phone] 11/28/2012,11:16 AM  LOS: 1 day   Patient seen and examined.  I agree with plan as above with additions as indicated. Kelly Splinter  MD Pager [phone]    Cell  [phone] 11/28/2012, 12:42 PM

## 2012-11-28 NOTE — Discharge Summary (Signed)
Name: Logan Martin MRN: [id number] DOB: Jan 13, 1956 57 y.o. PCP: Logan Starch, MD  Date of Admission: 11/27/2012  5:36 AM Date of Discharge: 11/28/2012 Attending Physician: Logan Pea, DO  Discharge Diagnosis: Principal Problem:   Chest discomfort Active Problems:   ESRD on dialysis   Secondary hyperparathyroidism (of renal origin)   Anemia associated with chronic renal failure   Shortness of breath  Discharge Medications:   Medication List         amLODipine 10 MG tablet  Commonly known as:  NORVASC  Take 10 mg by mouth daily.     calcium acetate 667 MG capsule  Commonly known as:  PHOSLO  Take 667 mg by mouth 5 (five) times daily. Takes 1 cap with 3 meals, 1 cap with 2 snacks     cinacalcet 60 MG tablet  Commonly known as:  SENSIPAR  Take 60 mg by mouth daily.     nitroGLYCERIN 0.4 MG SL tablet  Commonly known as:  NITROSTAT  Place 0.4 mg under the tongue every 5 (five) minutes as needed for chest pain (3 doses).     omeprazole 20 MG capsule  Commonly known as:  PRILOSEC  Take 20 mg by mouth daily.     oxycodone 30 MG immediate release tablet  Commonly known as:  ROXICODONE  Take 30 mg by mouth every 8 (eight) hours as needed.        Disposition and follow-up:   Logan Martin was discharged from [facility] in Stable condition.  At the hospital follow up visit please address:  1.  Please patient should ensure he keeps up with dialysis sessions, as scheduled.  Follow-up Appointments:     Follow-up Information   Schedule an appointment as soon as possible for a visit with Logan B, MD. (As needed when you are in [place])    Contact information:   [address] [place] [address] [phone]       Discharge Instructions: Discharge Orders   Future Orders Complete By Expires     Call MD for:  difficulty breathing, headache or visual disturbances  As directed     Call MD for:   persistant dizziness or light-headedness  As directed     Call MD for:  persistant nausea and vomiting  As directed     Diet - low sodium heart healthy  As directed     Discharge instructions  As directed     Comments:      Your Chest discomfort was likely due to Missed dialysis sessions, so its important that you do not skip dialysis sessions. We found that you didn't have a pulmonary embolus or heart attack.    Increase activity slowly  As directed        Consultations: Treatment Team:  Logan Blazing, MD  Procedures Performed:  Dg Chest 2 View  11/27/2012   *RADIOLOGY REPORT*   IMPRESSION: Fluid overload/congestive heart failure.  Interstitial edema.   Original Report Authenticated By: Logan Martin, M.D.   Dg Chest 2 View  11/26/2012   *RADIOLOGY REPORT*  .  IMPRESSION:  1.  Cardiomegaly and pulmonary vascular congestion. 2.  Trace bilateral pleural effusions. 3.  Pulmonary hyperexpansion compatible with emphysema.   Original Report Authenticated By: Logan Martin, M.D.   Ct Angio Chest Pe W/cm &/or Wo Cm 11/27/2012     IMPRESSION: No evidence for a pulmonary embolism.  Small bilateral pleural effusions.  Subcutaneous edema.  Patchy densities in the right lower lobe.  Findings could represent atelectasis or mild inflammatory process.  There are scattered small nodular densities in the lungs.  Many of these densities are stable.  There are a few new nodules.  The largest measures 4 mm in the lingula.  These findings are nonspecific.  Findings could represent a subtle inflammatory or infectious process.  If the patient is at high risk for bronchogenic carcinoma, follow-up chest CT at 1 year is recommended.  If the patient is at low risk, no follow-up is needed.  This recommendation follows the consensus statement: Guidelines for Management of Small Pulmonary Nodules Detected on CT Scans:  A Statement from the [place] as published in Radiology 2005; 237:395-400.  Multiple low-density  structures throughout the liver and kidneys. Findings could be associated with polycystic kidney disease or acquired renal cystic disease.   Original Report Authenticated By: Logan Martin, M.D.     Admission HPI: The patient is a 57 YO man who has ESRD on dialysis, HTN, secondary hyperparathyroidism, hepatitis C, chronic anemia. He lives in [place] but has family in the area and comes frequently to [place]. He has a local kidney doctor, Logan Martin and gets dialysis at [facility] on M/W/F while in town. He states that he is in town for a brother's wedding and also has a daughter in town. He missed Monday's dialysis session because he was caught up in [place] and then showed up on Wednesday and was told that the dialysis center thought he had returned to [place] and cancelled his session so he was unable to dialyze. He started having some shortness of breath this morning around 3-4 AM and had chest discomfort with that. He is also having some nausea but no vomiting. He states he has had a cardiac catheterization in the past in 2002 and was told it was okay. He has also had some stress tests, the last of which was in 2011 and again he was told it was fine. These were all done in [place]. He denied any diaphoresis or chest pain and states the heaviness in his chest comes from the fluid when he does not dialyze. He denies abdominal pain, diarrhea, constipation. He does not make any urine. He has noticed swelling in his legs and states they are large since missing dialysis. He has not had a cough or sputum production. He denies fevers or chills. He denies any numbness or weakness.  Of note, he was evaluated in the ED yesterday and no emergent need for dialysis was found.  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   Physical Exam:  Blood pressure 148/78, pulse 85, temperature 97.6 F (36.4 C), temperature source Oral, resp. rate 20, height 5\' 4"   (1.626 m), weight 128 lb (58.06 kg), SpO2 95.00%.  General: resting in bed, sitting up with donut pillow around neck  HEENT: PERRL, EOMI, no scleral icterus  Cardiac: S1 S2 normal  Pulm: minimal rales in the bases but otherwise clear to auscultation bilaterally, moving normal volumes of air  Abd: soft, nontender, nondistended, BS present  Ext: warm and well perfused, 2+ edema bilaterally to level of knee, no calf tenderness or redness, right extremity fistula with good thrill and bruit  Neuro: alert and oriented X3, cranial nerves II-XII grossly intact, no focal weakness and 5/5 strength in legs and arms   Lab results:  Basic Metabolic Panel:   Recent Labs  11/26/12 1748  11/27/12 0643   NA  135  139   K  4.6  5.6*   CL  98  104   CO2  21  18*   GLUCOSE  89  85   BUN  60*  68*   CREATININE  13.72*  15.27*   CALCIUM  7.7*  7.6*    Liver Function Tests:   Recent Labs   11/26/12 1748   AST  10   ALT  8   ALKPHOS  90   BILITOT  0.4   PROT  6.8   ALBUMIN  3.5    CBC:   Recent Labs   11/26/12 1748  11/27/12 0643   WBC  3.8*  3.8*   HGB  10.5*  9.9*   HCT  32.5*  30.2*   MCV  93.4  92.6   PLT  134*  121*    Cardiac Enzymes:  POC troponin 0.03 (negative)   Hospital Course by problem list: Principal Problem:   Chest discomfort Active Problems:   ESRD on dialysis   Secondary hyperparathyroidism (of renal origin)   Anemia associated with chronic renal failure   Shortness of breath   # Chest Discomfort and shortness of breath-  Patients chest discomfort was most likely due to pulmonary congestion from fluid overload as patient is on dialysis for ESRD and had just missed 2 sessions of dialysis. Chest Xrays done confirmed this. Patients BP, BUN, Cr and K were also increased. Patient was dialysed on the day of admission- 11/27/2012, Pre dialysis- BP- 192/87, Cr- 15.27, BUN- 68, K- 5.6. On discharge- BP- 141/69, Cr- 9.89, BUN- 39, K- 4.6. Patient felt much better after  dialysis. ACS was R/O by negative Enzymes and EKG findings, PE was also a concern as patient had a previous hx, Negative CT angio ruled this out. #ESRD on Dialysis- Patient is on Renal replacement therapy, has been on dialysis for 17 years,and is on sensipar and Phoslo -phosphorus was 4.8 at discharge. Patient is also on Amylodipine for hypertension.  # Secondary hyperparathyroidism-  Due to ESRD, Currently on Cinacalcet. Ca level at discharge- 8.4. # Anemia associated with CRF- HCT- is currently stable at 30.2 on discharge, patient is asymptomatic and needed no intervention on admission.   Discharge Vitals:   BP 141/69  Pulse 87  Temp(Src) 98.4 F (36.9 C) (Oral)  Resp 20  Ht 5\' 4"  (1.626 m)  Wt 55.6 kg (122 lb 9.2 oz)  BMI 21.03 kg/m2  SpO2 95%  Discharge Labs:  Results for orders placed during the hospital encounter of 11/27/12 (from the past 24 hour(s))  MRSA PCR SCREENING     Status: None   Collection Time    11/27/12 12:52 PM      Result Value Range   MRSA by PCR NEGATIVE  NEGATIVE  TROPONIN I     Status: None   Collection Time    11/27/12  4:53 PM      Result Value Range   Troponin I <0.30  <0.30 ng/mL  TROPONIN I     Status: None   Collection Time    11/27/12 10:53 PM      Result Value Range   Troponin I <0.30  <0.30 ng/mL  CBC     Status: Abnormal   Collection Time    11/28/12  5:00 AM      Result Value Range   WBC 3.4 (*) 4.0 - 10.5 K/uL   RBC 3.52 (*) 4.22 -  5.81 MIL/uL   Hemoglobin 10.6 (*) 13.0 - 17.0 g/dL   HCT 32.5 (*) 39.0 - 52.0 %   MCV 92.3  78.0 - 100.0 fL   MCH 30.1  26.0 - 34.0 pg   MCHC 32.6  30.0 - 36.0 g/dL   RDW 16.3 (*) 11.5 - 15.5 %   Platelets 115 (*) 150 - 400 K/uL  RENAL FUNCTION PANEL     Status: Abnormal   Collection Time    11/28/12  5:00 AM      Result Value Range   Sodium 141  135 - 145 mEq/L   Potassium 4.6  3.5 - 5.1 mEq/L   Chloride 103  96 - 112 mEq/L   CO2 23  19 - 32 mEq/L   Glucose, Bld 82  70 - 99 mg/dL   BUN 39 (*)  6 - 23 mg/dL   Creatinine, Ser 9.89 (*) 0.50 - 1.35 mg/dL   Calcium 8.4  8.4 - 10.5 mg/dL   Phosphorus 4.8 (*) 2.3 - 4.6 mg/dL   Albumin 3.4 (*) 3.5 - 5.2 g/dL   GFR calc non Af Amer 5 (*) >90 mL/min   GFR calc Af Amer 6 (*) >90 mL/min    Signed: Jenetta Downer, MD 11/28/2012, 12:50 PM   Time Spent on Discharge: 35 minutes

## 2012-11-28 NOTE — Progress Notes (Signed)
Subjective: No complaints today, patient feels much better.   Objective: Vital signs in last 24 hours: Filed Vitals:   11/27/12 2000 11/27/12 2012 11/27/12 2101 11/28/12 0537  BP: 174/97 192/87 140/94 151/75  Pulse: 77 66 78 92  Temp:  97.9 F (36.6 C) 98.4 F (36.9 C) 98.6 F (37 C)  TempSrc:  Oral Oral Oral  Resp: 19 17 16 20   Height:      Weight:  55.6 kg (122 lb 9.2 oz)    SpO2:  99% 92% 94%   Weight change: -1.66 kg (-3 lb 10.6 oz)  Intake/Output Summary (Last 24 hours) at 11/28/12 1050 Last data filed at 11/27/12 2012  Gross per 24 hour  Intake      0 ml  Output   3900 ml  Net  -3900 ml   General appearance: alert and cooperative, lying on the bed comfortably, in no distress. Head: Normocephalic, without obvious abnormality, atraumatic Lungs: clear to auscultation bilaterally Heart: regular rate and rhythm, S1, S2 normal, no murmur, click, rub or gallop Abdomen: soft, non-tender; bowel sounds normal; no masses,  no organomegaly Pulses: 2+ and symmetric  Lab Results: Basic Metabolic Panel:  Recent Labs Lab 11/27/12 0643 11/28/12 0500  NA 139 141  K 5.6* 4.6  CL 104 103  CO2 18* 23  GLUCOSE 85 82  BUN 68* 39*  CREATININE 15.27* 9.89*  CALCIUM 7.6* 8.4  PHOS  --  4.8*   Liver Function Tests:  Recent Labs Lab 11/26/12 1748 11/28/12 0500  AST 10  --   ALT 8  --   ALKPHOS 90  --   BILITOT 0.4  --   PROT 6.8  --   ALBUMIN 3.5 3.4*   No results found for this basename: LIPASE, AMYLASE,  in the last 168 hours No results found for this basename: AMMONIA,  in the last 168 hours CBC:  Recent Labs Lab 11/27/12 0643 11/28/12 0500  WBC 3.8* 3.4*  HGB 9.9* 10.6*  HCT 30.2* 32.5*  MCV 92.6 92.3  PLT 121* 115*   Cardiac Enzymes:  Recent Labs Lab 11/27/12 1115 11/27/12 1653 11/27/12 2253  TROPONINI <0.30 <0.30 <0.30    Micro Results: Recent Results (from the past 240 hour(s))  MRSA PCR SCREENING     Status: None   Collection Time   11/27/12 12:52 PM      Result Value Range Status   MRSA by PCR NEGATIVE  NEGATIVE Final   Comment:            The GeneXpert MRSA Assay (FDA     approved for NASAL specimens     only), is one component of a     comprehensive MRSA colonization     surveillance program. It is not     intended to diagnose MRSA     infection nor to guide or     monitor treatment for     MRSA infections.   Studies/Results: Ct Angio Chest Pe W/cm &/or Wo Cm  11/27/2012   *RADIOLOGY REPORT*  IMPRESSION: No evidence for a pulmonary embolism.  Small bilateral pleural effusions.  Subcutaneous edema.  Patchy densities in the right lower lobe.  Findings could represent atelectasis or mild inflammatory process.  There are scattered small nodular densities in the lungs.  Many of these densities are stable.  There are a few new nodules.  The largest measures 4 mm in the lingula.  These findings are nonspecific.  Findings could represent a subtle inflammatory  or infectious process.  If the patient is at high risk for bronchogenic carcinoma, follow-up chest CT at 1 year is recommended.  If the patient is at low risk, no follow-up is needed.  This recommendation follows the consensus statement: Guidelines for Management of Small Pulmonary Nodules Detected on CT Scans:  A Statement from the [place] as published in Radiology 2005; 237:395-400.  Multiple low-density structures throughout the liver and kidneys. Findings could be associated with polycystic kidney disease or acquired renal cystic disease.   Original Report Authenticated By: Markus Daft, M.D.   Medications: I have reviewed the patient's current medications. Scheduled Meds: . amLODipine  10 mg Oral Daily  . aspirin EC  81 mg Oral Daily  . calcium acetate  667 mg Oral 5 X Daily  . cinacalcet  60 mg Oral Q breakfast  . pantoprazole  40 mg Oral Daily  . sodium chloride  3 mL Intravenous Q12H   Continuous Infusions:  PRN Meds:.ondansetron (ZOFRAN) IV,  ondansetron, oxyCODONE Assessment/Plan: Principal Problem:   Chest discomfort Active Problems:   ESRD on dialysis   Secondary hyperparathyroidism (of renal origin)   Anemia associated with chronic renal failure   Shortness of breath  # Chest Discomfort- Resolved. Most likely d/t fluid overload from missing HD.  ACS & PE ruled out.  Echo without regional wall motion abnormalities.  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 Post transfusion- BP- 151/75, Cr- 9.89, BUN- 39. Syptoms were likely due to uremia.  # Anemia associated with CRF- HCT- 32.5.  # Secondary hyperparathyroidism-  Currently on Cinacalcet, Phoslo. Ca this morning- 8.4. Phosp- 4.8.  #VTE ppx: heparin  Dispo: presenting symptoms have resolved.  Anticipated discharge today.  The patient does have a current PCP Lucrezia Starch, MD) and does not need an [facility] hospital follow-up appointment after discharge.  The patient does not have transportation limitations that hinder transportation to clinic appointments.  .Services Needed at time of discharge: Y = Yes, Blank = No PT:   OT:   RN:   Equipment:   Other:     LOS: 1 day   Jenetta Downer, MD 11/28/2012, 10:50 AM

## 2012-12-01 NOTE — Discharge Summary (Signed)
  Date: 12/01/2012  Patient name: Logan Martin  Medical record number: [id number]  Date of birth: 06-May-1956   This patient has been discussed with the house staff. Please see their note for complete details. I concur with their findings and plan.  Dominic Pea, DO 12/01/2012, 1:31 PM

## 2013-04-02 ENCOUNTER — Other Ambulatory Visit: Payer: Self-pay

## 2013-05-09 ENCOUNTER — Encounter (HOSPITAL_COMMUNITY): Payer: Self-pay | Admitting: Emergency Medicine

## 2013-05-09 ENCOUNTER — Emergency Department (HOSPITAL_COMMUNITY)
Admission: EM | Admit: 2013-05-09 | Discharge: 2013-05-09 | Disposition: A | Discharge status: Home / Self Care | Payer: Medicare Other | Attending: Emergency Medicine | Admitting: Emergency Medicine

## 2013-05-09 DIAGNOSIS — Z9889 Other specified postprocedural states: Secondary | ICD-10-CM | POA: Insufficient documentation

## 2013-05-09 DIAGNOSIS — F1411 Cocaine abuse, in remission: Secondary | ICD-10-CM | POA: Insufficient documentation

## 2013-05-09 DIAGNOSIS — N186 End stage renal disease: Secondary | ICD-10-CM | POA: Insufficient documentation

## 2013-05-09 DIAGNOSIS — R197 Diarrhea, unspecified: Secondary | ICD-10-CM | POA: Insufficient documentation

## 2013-05-09 DIAGNOSIS — R112 Nausea with vomiting, unspecified: Secondary | ICD-10-CM

## 2013-05-09 DIAGNOSIS — Z87891 Personal history of nicotine dependence: Secondary | ICD-10-CM | POA: Insufficient documentation

## 2013-05-09 DIAGNOSIS — Z86711 Personal history of pulmonary embolism: Secondary | ICD-10-CM | POA: Insufficient documentation

## 2013-05-09 DIAGNOSIS — B1920 Unspecified viral hepatitis C without hepatic coma: Secondary | ICD-10-CM | POA: Insufficient documentation

## 2013-05-09 DIAGNOSIS — Z888 Allergy status to other drugs, medicaments and biological substances status: Secondary | ICD-10-CM | POA: Insufficient documentation

## 2013-05-09 DIAGNOSIS — Z79899 Other long term (current) drug therapy: Secondary | ICD-10-CM | POA: Insufficient documentation

## 2013-05-09 DIAGNOSIS — Z882 Allergy status to sulfonamides status: Secondary | ICD-10-CM | POA: Insufficient documentation

## 2013-05-09 DIAGNOSIS — Z992 Dependence on renal dialysis: Secondary | ICD-10-CM | POA: Insufficient documentation

## 2013-05-09 DIAGNOSIS — I120 Hypertensive chronic kidney disease with stage 5 chronic kidney disease or end stage renal disease: Secondary | ICD-10-CM | POA: Insufficient documentation

## 2013-05-09 LAB — COMPREHENSIVE METABOLIC PANEL
ALT: 6 U/L (ref 0–53)
AST: 11 U/L (ref 0–37)
Albumin: 3.5 g/dL (ref 3.5–5.2)
Alkaline Phosphatase: 96 U/L (ref 39–117)
CO2: 23 mEq/L (ref 19–32)
Chloride: 101 mEq/L (ref 96–112)
Creatinine, Ser: 12.65 mg/dL — ABNORMAL HIGH (ref 0.50–1.35)
GFR calc non Af Amer: 4 mL/min — ABNORMAL LOW (ref >90)
Potassium: 4.7 mEq/L (ref 3.5–5.1)
Sodium: 142 mEq/L (ref 135–145)
Total Bilirubin: 0.3 mg/dL (ref 0.3–1.2)

## 2013-05-09 LAB — CBC WITH DIFFERENTIAL/PLATELET
Basophils Absolute: 0 10*3/uL (ref 0.0–0.1)
Basophils Relative: 0 % (ref 0–1)
HCT: 28.8 % — ABNORMAL LOW (ref 39.0–52.0)
Lymphocytes Relative: 33 % (ref 12–46)
MCHC: 33.7 g/dL (ref 30.0–36.0)
Neutro Abs: 1.6 10*3/uL — ABNORMAL LOW (ref 1.7–7.7)
Neutrophils Relative %: 48 % (ref 43–77)
Platelets: 128 10*3/uL — ABNORMAL LOW (ref 150–400)
RDW: 15.6 % — ABNORMAL HIGH (ref 11.5–15.5)
WBC: 3.3 10*3/uL — ABNORMAL LOW (ref 4.0–10.5)

## 2013-05-09 MED ORDER — ONDANSETRON HCL 4 MG PO TABS: 4.0000 mg | ORAL_TABLET | Freq: Four times a day (QID) | ORAL | Status: DC

## 2013-05-09 MED ORDER — ONDANSETRON HCL 4 MG PO TABS
4.0000 mg | ORAL_TABLET | Freq: Once | ORAL | Status: AC
  Administered 2013-05-09: 4 mg via ORAL
  Filled 2013-05-09: qty 1

## 2013-05-09 NOTE — Progress Notes (Signed)
Weekend CSW met with patient to discuss transportation issues to dialysis. Patient was agreeable to speak. Patient states that he is here in [place] visiting his daughter but currently lives in [place] (where he receives his dialysis). A relative was supposed to drive patient back to [facility] earlier this week but was unable to. Patient states that his relative will possibly provide him a ride back to [facility] by Tuesday, and that the backup plan is for his daughter to send him by train or bus. Patient reports that he is considering moving back to [place] in the near future. Weekend CSW informed patient that he would need to have his Medicaid insurance transferred to [facility] if he moved, and left patient information regarding [place] Medicaid transportation to medical appointments. CSW also provided patient with 211 resource card. Patient thanked thanked CSW for assistance, denies having any questions at this time.   Tilden Fossa, MSW, [place] Clinical Social Worker [facility] Emergency Dept. [phone]

## 2013-05-09 NOTE — ED Notes (Signed)
Patient discharged to home with family. NAD.  

## 2013-05-09 NOTE — ED Notes (Signed)
Reports being unable to get dialysis since Tuesday and now starting to not feel well and having n/v/d. No acute distress noted, airway intact, no resp distress noted.

## 2013-05-09 NOTE — ED Provider Notes (Signed)
CSN: [id number]     Arrival date & time 05/09/13  0907 History   First MD Initiated Contact with Patient 05/09/13 [phone]     No chief complaint on file.  (Consider location/radiation/quality/duration/timing/severity/associated sxs/prior Treatment) Patient is a 57 y.o. male presenting with vomiting. The history is provided by the patient. No language interpreter was used.  Emesis Severity:  Mild Duration:  1 day Context: not post-tussive and not self-induced   Associated symptoms: diarrhea   Associated symptoms: no abdominal pain, no chills, no cough, no fever, no myalgias and no URI   Risk factors: no prior abdominal surgery, no sick contacts and no suspect food intake    Pt is a 57 year old male who presents with vomiting and diarrhea. He reports that he has traveled from [place] to visit family. He normally has dialysis on Tues-Thurs-Sat and last had a dialysis treatment this past Tuesday, 4 days ago. He reports that he was supposed to go back to [place] on Wed. But his friend got arrested for a DUI. He reports that he will be going back to [place] by Mon or Tues. He says that when he waits too long in between treatments he gets nauseated and has vomiting. He has had vomiting that started this morning and still has some associated nausea, he reports that the vomiting has slowed down as well as a couple of episodes of diarrhea that he has had. He denies chest pain or pressure, shortness of breath, difficulty breathing or abdominal pain.    Past Medical History  Diagnosis Date  . Hx of bladder repair surgery   . Hepatitis C   . Cocaine abuse in remission     Quit date in 1995  . Alcohol abuse     quit date in 1995  . 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 Started HD in [place], [place].  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  On NxStage home hemo since 2010. Using RUA AVF.    [name] Hypertension   . Pulmonary embolism Feb 2012    Treated with  coumadin x 1 year  . Renal insufficiency    Past Surgical History  Procedure Laterality Date  . Hernia repair    . Bladder surgery    . Av fistula placement     History reviewed. No pertinent family history. History  Substance Use Topics  . Smoking status: Former Smoker    Quit date: 05/28/1993  . Smokeless tobacco: Never Used  . Alcohol Use: No    Review of Systems  Constitutional: Negative for fever, chills and fatigue.  Respiratory: Negative for cough, chest tightness, shortness of breath and wheezing.   Cardiovascular: Negative for chest pain and leg swelling.  Gastrointestinal: Positive for nausea, vomiting and diarrhea. Negative for abdominal pain, constipation and abdominal distention.  Musculoskeletal: Negative for myalgias.  Neurological: Negative for dizziness, weakness and light-headedness.  All other systems reviewed and are negative.    Allergies  Heparin; Pork-derived products; Sulfa antibiotics; and Sulfonamide derivatives  Home Medications   Current Outpatient Rx  Name  Route  Sig  Dispense  Refill  . amLODipine (NORVASC) 10 MG tablet   Oral   Take 10 mg by mouth daily.         . calcium acetate (PHOSLO) 667 MG capsule   Oral   Take 667 mg by mouth 5 (five) times daily. Takes 1 cap with 3 meals, 1 cap with 2 snacks         .  cinacalcet (SENSIPAR) 60 MG tablet   Oral   Take 60 mg by mouth daily.          . nitroGLYCERIN (NITROSTAT) 0.4 MG SL tablet   Sublingual   Place 0.4 mg under the tongue every 5 (five) minutes as needed for chest pain (3 doses).         [name] omeprazole (PRILOSEC) 20 MG capsule   Oral   Take 20 mg by mouth daily.         [name] oxycodone (ROXICODONE) 30 MG immediate release tablet   Oral   Take 30 mg by mouth every 8 (eight) hours as needed.           There were no vitals taken for this visit. Physical Exam  Nursing note and vitals reviewed. Constitutional: He is oriented to person, place, and time. He appears  well-developed and well-nourished. No distress.  HENT:  Head: Normocephalic and atraumatic.  Mouth/Throat: Oropharynx is clear and moist.  Eyes: Conjunctivae and EOM are normal.  Neck: Normal range of motion. Neck supple. No JVD present. No tracheal deviation present. No thyromegaly present.  Cardiovascular: Normal rate, regular rhythm and normal heart sounds.   Pulmonary/Chest: Effort normal and breath sounds normal.  Abdominal: Soft. Bowel sounds are normal. He exhibits no distension and no mass. There is no tenderness. There is no rebound and no guarding.  Musculoskeletal: Normal range of motion.  Lymphadenopathy:    He has no cervical adenopathy.  Neurological: He is alert and oriented to person, place, and time.  Skin: Skin is warm and dry.     Dialysis fistula, +thrill and bruit.   Psychiatric: He has a normal mood and affect. His behavior is normal. Judgment and thought content normal.    ED Course  Procedures (including critical care time) Labs Review Labs Reviewed - No data to display Imaging Review No results found.  EKG Interpretation   None       MDM   1. Nausea & vomiting    No nausea and vomiting while here in ER. Pt given zofran and tolerating PO fluids here. He is a dialysis pt who lives in [place]. He is hoping to go home on Monday or Tuesday at the latest. His K+ and electrolytes are stable at this time and he is well-appearing. EKG unremarkable. Pt agrees to return if he is feeling worse, otherwise will make arrangements with his dialysis facility.      Elisha Headland, NP 05/10/13 [phone]

## 2013-05-10 ENCOUNTER — Emergency Department (HOSPITAL_COMMUNITY): Payer: Medicare Other

## 2013-05-10 ENCOUNTER — Observation Stay (HOSPITAL_COMMUNITY)
Admission: EM | Admit: 2013-05-10 | Discharge: 2013-05-10 | Disposition: A | Discharge status: Home / Self Care | Payer: Medicare Other | Attending: Internal Medicine | Admitting: Internal Medicine

## 2013-05-10 ENCOUNTER — Encounter (HOSPITAL_COMMUNITY): Payer: Self-pay | Admitting: Emergency Medicine

## 2013-05-10 DIAGNOSIS — N2581 Secondary hyperparathyroidism of renal origin: Secondary | ICD-10-CM | POA: Insufficient documentation

## 2013-05-10 DIAGNOSIS — R768 Other specified abnormal immunological findings in serum: Secondary | ICD-10-CM

## 2013-05-10 DIAGNOSIS — D631 Anemia in chronic kidney disease: Secondary | ICD-10-CM

## 2013-05-10 DIAGNOSIS — N186 End stage renal disease: Secondary | ICD-10-CM | POA: Insufficient documentation

## 2013-05-10 DIAGNOSIS — B182 Chronic viral hepatitis C: Secondary | ICD-10-CM | POA: Diagnosis present

## 2013-05-10 DIAGNOSIS — Z992 Dependence on renal dialysis: Secondary | ICD-10-CM | POA: Insufficient documentation

## 2013-05-10 DIAGNOSIS — B1920 Unspecified viral hepatitis C without hepatic coma: Secondary | ICD-10-CM | POA: Insufficient documentation

## 2013-05-10 DIAGNOSIS — I120 Hypertensive chronic kidney disease with stage 5 chronic kidney disease or end stage renal disease: Secondary | ICD-10-CM | POA: Insufficient documentation

## 2013-05-10 DIAGNOSIS — I2510 Atherosclerotic heart disease of native coronary artery without angina pectoris: Secondary | ICD-10-CM

## 2013-05-10 DIAGNOSIS — R894 Abnormal immunological findings in specimens from other organs, systems and tissues: Secondary | ICD-10-CM

## 2013-05-10 DIAGNOSIS — N189 Chronic kidney disease, unspecified: Secondary | ICD-10-CM

## 2013-05-10 DIAGNOSIS — R0602 Shortness of breath: Principal | ICD-10-CM | POA: Insufficient documentation

## 2013-05-10 LAB — CBC
HCT: 29 % — ABNORMAL LOW (ref 39.0–52.0)
Hemoglobin: 9.5 g/dL — ABNORMAL LOW (ref 13.0–17.0)
MCH: 32 pg (ref 26.0–34.0)
MCV: 97.6 fL (ref 78.0–100.0)
Platelets: 123 10*3/uL — ABNORMAL LOW (ref 150–400)
RBC: 2.97 MIL/uL — ABNORMAL LOW (ref 4.22–5.81)

## 2013-05-10 LAB — COMPREHENSIVE METABOLIC PANEL
Albumin: 3.4 g/dL — ABNORMAL LOW (ref 3.5–5.2)
BUN: 46 mg/dL — ABNORMAL HIGH (ref 6–23)
CO2: 22 mEq/L (ref 19–32)
Calcium: 8.4 mg/dL (ref 8.4–10.5)
Creatinine, Ser: 13.51 mg/dL — ABNORMAL HIGH (ref 0.50–1.35)
GFR calc Af Amer: 4 mL/min — ABNORMAL LOW (ref >90)
GFR calc non Af Amer: 4 mL/min — ABNORMAL LOW (ref >90)
Glucose, Bld: 59 mg/dL — ABNORMAL LOW (ref 70–99)

## 2013-05-10 LAB — PRO B NATRIURETIC PEPTIDE: Pro B Natriuretic peptide (BNP): 13135 pg/mL — ABNORMAL HIGH (ref 0–125)

## 2013-05-10 LAB — TROPONIN I: Troponin I: <0.3 ng/mL (ref <0.30)

## 2013-05-10 MED ORDER — LIDOCAINE HCL (PF) 1 % IJ SOLN: 5.0000 mL | INTRAMUSCULAR | Status: DC | PRN

## 2013-05-10 MED ORDER — NEPRO/CARBSTEADY PO LIQD: 237.0000 mL | ORAL | Status: DC | PRN

## 2013-05-10 MED ORDER — HEPARIN SODIUM (PORCINE) 1000 UNIT/ML DIALYSIS: 1000.0000 [IU] | INTRAMUSCULAR | Status: DC | PRN

## 2013-05-10 MED ORDER — ONDANSETRON HCL 4 MG/2ML IJ SOLN: 4.0000 mg | Freq: Four times a day (QID) | INTRAMUSCULAR | Status: DC | PRN

## 2013-05-10 MED ORDER — ACETAMINOPHEN 325 MG PO TABS: 650.0000 mg | ORAL_TABLET | Freq: Four times a day (QID) | ORAL | Status: DC | PRN

## 2013-05-10 MED ORDER — SODIUM CHLORIDE 0.9 % IJ SOLN: 3.0000 mL | INTRAMUSCULAR | Status: DC | PRN

## 2013-05-10 MED ORDER — ONDANSETRON HCL 4 MG PO TABS: 4.0000 mg | ORAL_TABLET | Freq: Four times a day (QID) | ORAL | Status: DC | PRN

## 2013-05-10 MED ORDER — PENTAFLUOROPROP-TETRAFLUOROETH EX AERO: 1.0000 "application " | INHALATION_SPRAY | CUTANEOUS | Status: DC | PRN

## 2013-05-10 MED ORDER — ALTEPLASE 2 MG IJ SOLR
2.0000 mg | Freq: Once | INTRAMUSCULAR | Status: DC | PRN
  Filled 2013-05-10: qty 2

## 2013-05-10 MED ORDER — ZOLPIDEM TARTRATE 5 MG PO TABS: 5.0000 mg | ORAL_TABLET | Freq: Every evening | ORAL | Status: DC | PRN

## 2013-05-10 MED ORDER — ACETAMINOPHEN 650 MG RE SUPP: 650.0000 mg | Freq: Four times a day (QID) | RECTAL | Status: DC | PRN

## 2013-05-10 MED ORDER — OXYCODONE HCL 5 MG PO TABS
5.0000 mg | ORAL_TABLET | ORAL | Status: DC | PRN
  Administered 2013-05-10: 5 mg via ORAL
  Filled 2013-05-10: qty 1

## 2013-05-10 MED ORDER — SODIUM CHLORIDE 0.9 % IJ SOLN: 3.0000 mL | Freq: Two times a day (BID) | INTRAMUSCULAR | Status: DC

## 2013-05-10 MED ORDER — SODIUM CHLORIDE 0.9 % IV SOLN: 250.0000 mL | INTRAVENOUS | Status: DC | PRN

## 2013-05-10 MED ORDER — LIDOCAINE-PRILOCAINE 2.5-2.5 % EX CREA: 1.0000 "application " | TOPICAL_CREAM | CUTANEOUS | Status: DC | PRN

## 2013-05-10 MED ORDER — SODIUM CHLORIDE 0.9 % IV SOLN: 100.0000 mL | INTRAVENOUS | Status: DC | PRN

## 2013-05-10 MED ORDER — HYDROMORPHONE HCL PF 1 MG/ML IJ SOLN
0.5000 mg | INTRAMUSCULAR | Status: DC | PRN
Start: 2013-05-10 — End: 2013-05-10

## 2013-05-10 NOTE — Consult Note (Signed)
Renal Service Consult Note [facility]  Logan Martin 05/10/2013 [place] Requesting Physician:  Dr Karleen Hampshire  Reason for Consult:  ESRD pt in [place], missed last HD x 2 HPI: The patient is a 57 y.o. year-old with hx of HTN, ESRD, prior substance abuse, now lives and dialyzes in [place] in [place] on TTS schedule.  His last HD was in [facility] on Tuesday. He came to town and was to leave back for [place] but the relative who was to drive him is in jail with a DUI.  Last HD 5d ago.  +nausea and vomiting, and SOB at rest. Came to ED. K ok, cxr no gross edema.    ROS  no cp  no fever  no access probs  no heparin (doesn't use pork products)  strict vegan  Past Medical History  Past Medical History  Diagnosis Date  . Hx of bladder repair surgery   . Hepatitis C   . Cocaine abuse in remission     Quit date in 1995  . Alcohol abuse     quit date in 1995  . 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 Started HD in [place], [place].  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  On NxStage home hemo since 2010. Using RUA AVF.    [name] Hypertension   . Pulmonary embolism Feb 2012    Treated with coumadin x 1 year  . Renal insufficiency    Past Surgical History  Past Surgical History  Procedure Laterality Date  . Hernia repair    . Bladder surgery    . Av fistula placement     Family History No family history on file. Social History  reports that he quit smoking about 19 years ago. He has never used smokeless tobacco. He reports that he does not drink alcohol or use illicit drugs. Allergies  Allergies  Allergen Reactions  . Heparin Rash    Pork.   . Pork-Derived Products Rash  . Sulfa Antibiotics Rash  . Sulfonamide Derivatives Rash   Home medications Prior to Admission medications   Medication Sig Start Date End Date Taking? Authorizing Provider  amLODipine (NORVASC) 10 MG tablet Take 10 mg by mouth daily.   Yes  Historical Provider, MD  calcium acetate (PHOSLO) 667 MG capsule Take 667 mg by mouth 5 (five) times daily. Takes 1 cap with 3 meals, 1 cap with 2 snacks   Yes Historical Provider, MD  omeprazole (PRILOSEC) 20 MG capsule Take 20 mg by mouth daily.   Yes Historical Provider, MD  ondansetron (ZOFRAN) 4 MG tablet Take 1 tablet (4 mg total) by mouth every 6 (six) hours. 05/09/13  Yes Elisha Headland, NP  oxycodone (ROXICODONE) 30 MG immediate release tablet Take 30 mg by mouth every 8 (eight) hours as needed for pain.    Yes Historical Provider, MD   Liver Function Tests  Recent Labs Lab 05/09/13 0930 05/10/13 0127  AST 11 11  ALT 6 6  ALKPHOS 96 92  BILITOT 0.3 0.3  PROT 6.8 6.5  ALBUMIN 3.5 3.4*    Recent Labs Lab 05/09/13 0930  LIPASE 56   CBC  Recent Labs Lab 05/09/13 0930 05/10/13 0127  WBC 3.3* 3.9*  NEUTROABS 1.6*  --   HGB 9.7* 9.5*  HCT 28.8* 29.0*  MCV 96.0 97.6  PLT 128* [id number]*   Basic Metabolic Panel  Recent Labs Lab 05/09/13 0930 05/10/13 0127  NA 142  139  K 4.7 4.4  CL 101 100  CO2 23 22  GLUCOSE 121* 59*  BUN 39* 46*  CREATININE 12.65* 13.51*  CALCIUM 9.0 8.4    Exam  Blood pressure 159/70, pulse 71, temperature 97.6 F (36.4 C), temperature source Oral, resp. rate 17, height 5\' 4"  (1.626 m), weight 55.2 kg (121 lb 11.1 oz), SpO2 100.00%.  gen: alert, no distress  skin: no rash, cyanosis  heent: eomi, sclera anicteric, throat clear  neck: no jvd, no LAN  chest: rales L base, R clear  cor: regular, no M or rub, pedal pulses intact  abd: soft, nt, nd, no ascites  ext: no LE or UE edema , no joint effusion, no gangrene/ulcers  neuro: alert, ox3, nonfocal  access: R arm avf patent  Dialysis: TTS in [place] 4 hrs    54.5kg   No heparin (no pork products)  Assessment/Plan: 1. Uremia / N/ V- due to missed HD 2. ESRD- HD today, should be ok for d/c after HD 3. Anemia / CKD- Hb 9.5, no rx 4. HTN/volume- 1-2 kg up, no gross vol  excess   Kelly Splinter MD (pgr) [phone]    (c) [phone] 05/10/2013, 9:18 AM

## 2013-05-10 NOTE — ED Notes (Signed)
Admitting at bedside 

## 2013-05-10 NOTE — ED Provider Notes (Signed)
  This was a shared visit with a mid-level provided (NP or PA).  Throughout the patient's course I was available for consultation/collaboration.  I saw the ECG (if appropriate), relevant labs and studies - I agree with the interpretation.  On my exam the patient was in no distress.      Carmin Muskrat, MD 05/10/13 [phone]

## 2013-05-10 NOTE — ED Provider Notes (Signed)
CSN: [id number]     Arrival date & time 05/10/13  0014 History   First MD Initiated Contact with Patient 05/10/13 0039     Chief Complaint  Patient presents with  . Shortness of Breath   (Consider location/radiation/quality/duration/timing/severity/associated sxs/prior Treatment) HPI Comments: 57 yo AA male presents to ER with cc of SOB.  Pt has PMH of ESRD on HD - last HD was Tues 05/05/14 in [place].  He is visiting in [place], but was supposed to return to [place] on Wednesday, but his family member received a DUI and was unable to take him back.  Pt also has PMH of hepatitis C, EtOH abuse, cocaine abuse, HTN, h/o PE.  Pt was anticoagulated in the past, but is currently no taking anticoagulation.    Pt denies CP, recent infection, productive cough.    Patient is a 57 y.o. male presenting with shortness of breath. The history is provided by the patient.  Shortness of Breath Severity:  Mild Onset quality:  Gradual Duration:  3 days Timing:  Intermittent Chronicity:  Recurrent Context: activity   Context: not animal exposure, not emotional upset, not fumes, not known allergens, not occupational exposure, not pollens, not smoke exposure, not strong odors, not URI and not weather changes   Relieved by:  None tried Worsened by:  Activity Ineffective treatments:  None tried Associated symptoms: vomiting   Associated symptoms: no abdominal pain, no chest pain, no cough and no wheezing   Associated symptoms comment:  Pt was seen in ER 05/09/13 for n/v Risk factors: hx of PE/DVT   Risk factors: no recent alcohol use, no family hx of DVT, no hx of cancer, no obesity, no oral contraceptive use, no prolonged immobilization, no recent surgery and no tobacco use   Risk factors comment:  Pt has remote h/o PE   Past Medical History  Diagnosis Date  . Hx of bladder repair surgery   . Hepatitis C   . Cocaine abuse in remission     Quit date in 1995  . Alcohol abuse     quit date in 1995  . 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 Started HD in [place], [place].  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  On NxStage home hemo since 2010. Using RUA AVF.    [name] Hypertension   . Pulmonary embolism Feb 2012    Treated with coumadin x 1 year  . Renal insufficiency    Past Surgical History  Procedure Laterality Date  . Hernia repair    . Bladder surgery    . Av fistula placement     No family history on file. History  Substance Use Topics  . Smoking status: Former Smoker    Quit date: 05/28/1993  . Smokeless tobacco: Never Used  . Alcohol Use: No    Review of Systems  Constitutional: Negative.   HENT: Negative.   Respiratory: Positive for shortness of breath. Negative for apnea, cough, choking, chest tightness, wheezing and stridor.   Cardiovascular: Negative.  Negative for chest pain, palpitations and leg swelling.  Gastrointestinal: Positive for nausea and vomiting. Negative for abdominal pain, diarrhea, constipation, blood in stool, abdominal distention and anal bleeding.  Endocrine: Negative.   Genitourinary: Negative.   Musculoskeletal: Negative.   Neurological: Negative.   Hematological: Negative.   Psychiatric/Behavioral: Negative.     Allergies  Heparin; Pork-derived products; Sulfa antibiotics; and Sulfonamide derivatives  Home Medications   Current Outpatient Rx  Name  Route  Sig  Dispense  Refill  . amLODipine (NORVASC) 10 MG tablet   Oral   Take 10 mg by mouth daily.         . calcium acetate (PHOSLO) 667 MG capsule   Oral   Take 667 mg by mouth 5 (five) times daily. Takes 1 cap with 3 meals, 1 cap with 2 snacks         . omeprazole (PRILOSEC) 20 MG capsule   Oral   Take 20 mg by mouth daily.         . ondansetron (ZOFRAN) 4 MG tablet   Oral   Take 1 tablet (4 mg total) by mouth every 6 (six) hours.   12 tablet   0   . oxycodone (ROXICODONE) 30 MG immediate release tablet   Oral   Take 30 mg by  mouth every 8 (eight) hours as needed for pain.           BP 132/57  Pulse 74  Temp(Src) 97.5 F (36.4 C) (Oral)  Resp 18  Ht 5\' 4"  (1.626 m)  Wt 120 lb (54.432 kg)  BMI 20.59 kg/m2  SpO2 96% Physical Exam  Nursing note and vitals reviewed. Constitutional: He appears well-developed and well-nourished. No distress.  Resting comfortably, speaking in full sentences, no increased wob, sats 97% on RA, resp 12.  Pt in nad.  HENT:  Head: Normocephalic and atraumatic.  Eyes: Conjunctivae are normal. Pupils are equal, round, and reactive to light. Right eye exhibits no discharge. Left eye exhibits no discharge.  Neck: Normal range of motion. Neck supple. No JVD present.  Cardiovascular: Normal rate and regular rhythm.  Exam reveals no friction rub.   No murmur heard. Pulmonary/Chest: Effort normal and breath sounds normal. No stridor. No respiratory distress. He has no wheezes. He has no rales.  Abdominal: Soft. Bowel sounds are normal. He exhibits no distension and no mass. There is no tenderness. There is no rebound and no guarding.  Scars noted from previous abd surgeries  Musculoskeletal: Normal range of motion. He exhibits no edema and no tenderness.  Fistula in RUE  Neurological: He is alert. He has normal reflexes.  Skin: Skin is warm and dry.    ED Course  Procedures (including critical care time) Labs Review Labs Reviewed  CBC - Abnormal; Notable for the following:    WBC 3.9 (*)    RBC 2.97 (*)    Hemoglobin 9.5 (*)    HCT 29.0 (*)    RDW 15.7 (*)    Platelets 123 (*)    All other components within normal limits  COMPREHENSIVE METABOLIC PANEL - Abnormal; Notable for the following:    Glucose, Bld 59 (*)    BUN 46 (*)    Creatinine, Ser 13.51 (*)    Albumin 3.4 (*)    GFR calc non Af Amer 4 (*)    GFR calc Af Amer 4 (*)    All other components within normal limits  PRO B NATRIURETIC PEPTIDE - Abnormal; Notable for the following:    Pro B Natriuretic peptide (BNP)  13135.0 (*)    All other components within normal limits  TROPONIN I   Imaging Review Dg Chest 2 View  05/10/2013   CLINICAL DATA:  Shortness of breath.  EXAM: CHEST  2 VIEW  COMPARISON:  11/27/2012.  FINDINGS: Cardiomegaly. Mild right basilar scarring. No active infiltrates or failure. No effusion or pneumothorax. No osseous findings.  IMPRESSION: Cardiomegaly. No  active cardiopulmonary disease. Improved aeration from priors.   Electronically Signed   By: Rolla Flatten M.D.   On: 05/10/2013 01:54    EKG Interpretation    Date/Time:  Sunday May 10 2013 00:24:43 EST Ventricular Rate:  71 PR Interval:  173 QRS Duration: 91 QT Interval:  435 QTC Calculation: 473 R Axis:   27 Text Interpretation:  Sinus rhythm Borderline low voltage, extremity leads Anteroseptal infarct, old Nonspecific T abnormalities, lateral leads Baseline wander in lead(s) V4 Confirmed by [facility]  MD, Maiko Salais ([id number]) on 05/10/2013 12:54:43 AM           Results for orders placed during the hospital encounter of 05/10/13  CBC      Result Value Range   WBC 3.9 (*) 4.0 - 10.5 K/uL   RBC 2.97 (*) 4.22 - 5.81 MIL/uL   Hemoglobin 9.5 (*) 13.0 - 17.0 g/dL   HCT 29.0 (*) 39.0 - 52.0 %   MCV 97.6  78.0 - 100.0 fL   MCH 32.0  26.0 - 34.0 pg   MCHC 32.8  30.0 - 36.0 g/dL   RDW 15.7 (*) 11.5 - 15.5 %   Platelets 123 (*) 150 - 400 K/uL  COMPREHENSIVE METABOLIC PANEL      Result Value Range   Sodium 139  135 - 145 mEq/L   Potassium 4.4  3.5 - 5.1 mEq/L   Chloride 100  96 - 112 mEq/L   CO2 22  19 - 32 mEq/L   Glucose, Bld 59 (*) 70 - 99 mg/dL   BUN 46 (*) 6 - 23 mg/dL   Creatinine, Ser 13.51 (*) 0.50 - 1.35 mg/dL   Calcium 8.4  8.4 - 10.5 mg/dL   Total Protein 6.5  6.0 - 8.3 g/dL   Albumin 3.4 (*) 3.5 - 5.2 g/dL   AST 11  0 - 37 U/L   ALT 6  0 - 53 U/L   Alkaline Phosphatase 92  39 - 117 U/L   Total Bilirubin 0.3  0.3 - 1.2 mg/dL   GFR calc non Af Amer 4 (*) >90 mL/min   GFR calc Af Amer 4 (*) >90 mL/min   TROPONIN I      Result Value Range   Troponin I <0.30  <0.30 ng/mL  PRO B NATRIURETIC PEPTIDE      Result Value Range   Pro B Natriuretic peptide (BNP) 13135.0 (*) 0 - 125 pg/mL    MDM   1. SOB (shortness of breath)   2. ESRD on dialysis    57 year old male presents emergency department with chief complaint of shortness of breath. Patient has end-stage renal disease and is scheduled for hemodialysis on Tuesday Thursday Saturday. He is from [place] and is seen on dialysis is performed there are. He has missed 2 courses of hemodialysis and now complains of worsening shortness of breath. He was seen yesterday and discharged, but states his symptoms have worsened.  Minimal change in symptoms in the ER. Consult the hospitalist to admit. Plan admit to MedSurg floor. I will also discuss with nephrology so that they're aware of this patient and he can be sent for dialysis today.    Elmer Sow, MD 05/10/13 [phone]

## 2013-05-10 NOTE — Progress Notes (Signed)
Called ED RN for report. Room ready for admission.

## 2013-05-10 NOTE — ED Notes (Signed)
Per ems-- pt reports sob x 1 hour. Dialysis pt in [place] (tues, thurs, sat) missed this past Thursday and today because he is here. States discomfort feels similar to when he needs dialysis. Pt seen here earlier today for nausea. Blood levels wnl and sent home. o2 stats 97% RA. No edema noted. Pt speaks in full sentences and ambulatory. Pt denies pain. Dialysis access to R upper arm.

## 2013-05-10 NOTE — Procedures (Signed)
I was present at this dialysis session, have reviewed the session itself and made  appropriate changes  Kelly Splinter MD (pgr) [phone]    [phone] 05/10/2013, 11:04 AM

## 2013-05-10 NOTE — H&P (Signed)
[facility] History and Physical  PANKAJ HOUCHEN [id number] DOB: [birth year] DOA: 05/10/2013  Referring physician: EDP PCP: Lucrezia Starch, MD  Specialists:   Chief Complaint: SOB  HPI: Logan Martin is a 57 y.o. male with a history of ESRD on HD scheduled for Tuesdays , Thursdays and Saturdays in [place] where he lives presenting to the ED with complaints of Worsening SOB over the past 24 hours.  He missed his last 2 Dialysis treatments because he has been in this area visiting family.  He had intended to be back in [place] for his Saturday Dialysis Treatment, but his travel arrangements were weighlayed.   He had been seen in the ED `1 days ago due to nausea and vomiting and he reports those symptoms have resolved.  He denies having any fevers or chills or chest pain.     Review of Systems: The patient denies anorexia, fever, chills, headaches, weight loss, vision loss, diplopia, dizziness, decreased hearing, rhinitis, hoarseness, chest pain, syncope, dyspnea on exertion, peripheral edema, balance deficits, cough, hemoptysis, abdominal pain, nausea, vomiting, diarrhea, constipation, hematemesis, melena, hematochezia, severe indigestion/heartburn, dysuria, hematuria, incontinence, muscle weakness, suspicious skin lesions, transient blindness, difficulty walking, depression, unusual weight change, abnormal bleeding, enlarged lymph nodes, angioedema, and breast masses.    Past Medical History  Diagnosis Date  . Hx of bladder repair surgery   . Hepatitis C   . Cocaine abuse in remission     Quit date in 1995  . Alcohol abuse     quit date in 1995  . 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 Started HD in [place], [place].  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  On NxStage home hemo since 2010. Using RUA AVF.    [name] Hypertension   . Pulmonary embolism Feb 2012    Treated with coumadin x 1 year  . Renal insufficiency     Past Surgical  History  Procedure Laterality Date  . Hernia repair    . Bladder surgery    . Av fistula placement      Prior to Admission medications   Medication Sig Start Date End Date Taking? Authorizing Provider  amLODipine (NORVASC) 10 MG tablet Take 10 mg by mouth daily.   Yes Historical Provider, MD  calcium acetate (PHOSLO) 667 MG capsule Take 667 mg by mouth 5 (five) times daily. Takes 1 cap with 3 meals, 1 cap with 2 snacks   Yes Historical Provider, MD  omeprazole (PRILOSEC) 20 MG capsule Take 20 mg by mouth daily.   Yes Historical Provider, MD  ondansetron (ZOFRAN) 4 MG tablet Take 1 tablet (4 mg total) by mouth every 6 (six) hours. 05/09/13  Yes Elisha Headland, NP  oxycodone (ROXICODONE) 30 MG immediate release tablet Take 30 mg by mouth every 8 (eight) hours as needed for pain.    Yes Historical Provider, MD    Allergies  Allergen Reactions  . Heparin Rash    Pork.   . Pork-Derived Products Rash  . Sulfa Antibiotics Rash  . Sulfonamide Derivatives Rash     Social History:  reports that he quit smoking about 19 years ago. He has never used smokeless tobacco. He reports that he does not drink alcohol or use illicit drugs.      Family History:    Mother had DM2, and Lymphoma    Physical Exam:  GEN:  Pleasant Well Nourished and Well Developed  57 y.o. African American male  examined  and in no acute distress; cooperative with exam Filed Vitals:   05/10/13 0230 05/10/13 0300 05/10/13 0400 05/10/13 0430  BP: 158/79 146/57 132/57 129/53  Pulse: 68 76 74 78  Temp:      TempSrc:      Resp: 16 17 18 15   Height:      Weight:      SpO2: 98% 98% 96% 98%   Blood pressure 129/53, pulse 78, temperature 97.5 F (36.4 C), temperature source Oral, resp. rate 15, height 5\' 4"  (1.626 m), weight 54.432 kg (120 lb), SpO2 98.00%. PSYCH: He is alert and oriented x4; does not appear anxious does not appear depressed; affect is normal HEENT: Normocephalic and Atraumatic, Mucous membranes pink;  PERRLA; EOM intact; Fundi:  Benign;  No scleral icterus, Nares: Patent, Oropharynx: Clear, Fair Dentition, Neck:  FROM, no cervical lymphadenopathy nor thyromegaly or carotid bruit; no JVD; Breasts:: Not examined CHEST WALL: No tenderness CHEST: Normal respiration, clear to auscultation bilaterally HEART: Regular rate and rhythm; no murmurs rubs or gallops BACK: No kyphosis or scoliosis; no CVA tenderness ABDOMEN: Positive Bowel Sounds, soft non-tender; no masses, no organomegaly. Rectal Exam: Not done EXTREMITIES: No cyanosis, clubbing or edema; no ulcerations.  Right Upper Arm AV Fistula Genitalia: not examined PULSES: 2+ and symmetric SKIN: Normal hydration no rash or ulceration CNS: Cranial nerves 2-12 grossly intact no focal neurologic deficit    Labs on Admission:  Basic Metabolic Panel:  Recent Labs Lab 05/09/13 0930 05/10/13 0127  NA 142 139  K 4.7 4.4  CL 101 100  CO2 23 22  GLUCOSE 121* 59*  BUN 39* 46*  CREATININE 12.65* 13.51*  CALCIUM 9.0 8.4   Liver Function Tests:  Recent Labs Lab 05/09/13 0930 05/10/13 0127  AST 11 11  ALT 6 6  ALKPHOS 96 92  BILITOT 0.3 0.3  PROT 6.8 6.5  ALBUMIN 3.5 3.4*    Recent Labs Lab 05/09/13 0930  LIPASE 56   No results found for this basename: AMMONIA,  in the last 168 hours CBC:  Recent Labs Lab 05/09/13 0930 05/10/13 0127  WBC 3.3* 3.9*  NEUTROABS 1.6*  --   HGB 9.7* 9.5*  HCT 28.8* 29.0*  MCV 96.0 97.6  PLT 128* 123*   Cardiac Enzymes:  Recent Labs Lab 05/10/13 0127  TROPONINI <0.30    BNP (last 3 results)  Recent Labs  05/10/13 0127  PROBNP 13135.0*   CBG: No results found for this basename: GLUCAP,  in the last 168 hours  Radiological Exams on Admission: Dg Chest 2 View  05/10/2013   CLINICAL DATA:  Shortness of breath.  EXAM: CHEST  2 VIEW  COMPARISON:  11/27/2012.  FINDINGS: Cardiomegaly. Mild right basilar scarring. No active infiltrates or failure. No effusion or pneumothorax. No  osseous findings.  IMPRESSION: Cardiomegaly. No active cardiopulmonary disease. Improved aeration from priors.   Electronically Signed   By: Rolla Flatten M.D.   On: 05/10/2013 01:54     EKG: Independently reviewed.    Assessment/Plan Principal Problem:   SOB (shortness of breath) Active Problems:   CORONARY ARTERY DISEASE   ESRD on dialysis   Hepatitis C antibody test positive   Secondary hyperparathyroidism (of renal origin)   Anemia associated with chronic renal failure   HTN     1.  SOB - due to volume Overload caused by 2 consecutive missed Dialysis treatments.  Renal /Dialysis Team Notified to dialyse this AM.    2.  ESRD on HD-  Regular  Dialysis Schedule is Tues, Thursday, And Saturdays.    3.  CAD-  Stable   4.  HTN-  Continue Amlodipine.     5.  Secondary Hyperparathyroidism-  Continue Phoslo Rx.    6.  Hepatitis C-  LFTs are WNL,   7.  Anemia due to Renal Disease-    8.  SCDs for DVT prophylaxis.         Code Status:   FULL CODE Family Communication:   No Family present  Disposition Plan:    Observation    Time spent:  Aldora C [facility] Pager [phone]  If 7PM-7AM, please contact night-coverage [email or web address] Password [facility] 05/10/2013, 6:13 AM

## 2013-05-10 NOTE — Discharge Summary (Signed)
Physician Discharge Summary  Logan Martin [id number] DOB: [birth year] DOA: 05/10/2013  PCP: Lucrezia Starch, MD  Admit date: 05/10/2013 Discharge date: 05/10/2013  Time spent: 25 minutes  Recommendations for Outpatient Follow-up:  1. Follow up with PCP in one week.    Discharge Diagnoses:  Principal Problem:   SOB (shortness of breath) Active Problems:   CORONARY ARTERY DISEASE   ESRD on dialysis   Hepatitis C antibody test positive   Secondary hyperparathyroidism (of renal origin)   Anemia associated with chronic renal failure   Discharge Condition: improved.   Diet recommendation: renal   Filed Weights   05/10/13 0812 05/10/13 1009 05/10/13 1308  Weight: 55.2 kg (121 lb 11.1 oz) 55.2 kg (121 lb 11.1 oz) 53.2 kg (117 lb 4.6 oz)    History of present illness:  Logan Martin is a 57 y.o. male with a history of ESRD on HD scheduled for Tuesdays , Thursdays and Saturdays in [place] where he lives presenting to the ED with complaints of Worsening SOB over the past 24 hours. He missed his last 2 Dialysis treatments because he has been in this area visiting family. He had intended to be back in [place] for his Saturday Dialysis Treatment, but his travel arrangements were weighlayed. He had been seen in the ED `1 days ago due to nausea and vomiting and he reports those symptoms have resolved. HE WAS admitted for HD. And discharged after the HD.    Hospital Course:  1. SOB - due to volume Overload caused by 2 consecutive missed Dialysis treatments. Renal [facility] Team Notified he underwent HD this am and his symptoms resolved and he was discharged home.   2. ESRD on HD- Regular Dialysis Schedule is Tues, Thursday, And Saturdays.  3. CAD- Stable  4. HTN- Continue Amlodipine.  5. Secondary Hyperparathyroidism- Continue Phoslo Rx.  6. Hepatitis C- LFTs are WNL,  7. Anemia due to Renal Disease- stable.    Procedures:  HD  Consultations:  rena  Discharge Exam: Filed  Vitals:   05/10/13 1308  BP: 163/82  Pulse: 70  Temp:   Resp: 18    General: alert afebrile comfortable Cardiovascular: s1s2 Respiratory: ctab  Discharge Instructions  Discharge Orders   Future Orders Complete By Expires   Diet - low sodium heart healthy  As directed    Discharge instructions  As directed    Comments:     Follow up with PCP in one week.       Medication List         amLODipine 10 MG tablet  Commonly known as:  NORVASC  Take 10 mg by mouth daily.     calcium acetate 667 MG capsule  Commonly known as:  PHOSLO  Take 667 mg by mouth 5 (five) times daily. Takes 1 cap with 3 meals, 1 cap with 2 snacks     feeding supplement (NEPRO CARB STEADY) Liqd  Take 237 mLs by mouth as needed (missed meal during dialysis.).     omeprazole 20 MG capsule  Commonly known as:  PRILOSEC  Take 20 mg by mouth daily.     ondansetron 4 MG tablet  Commonly known as:  ZOFRAN  Take 1 tablet (4 mg total) by mouth every 6 (six) hours.     oxycodone 30 MG immediate release tablet  Commonly known as:  ROXICODONE  Take 30 mg by mouth every 8 (eight) hours as needed for pain.       Allergies  Allergen  Reactions  . Heparin Rash    Pork.   . Pork-Derived Products Rash  . Sulfa Antibiotics Rash  . Sulfonamide Derivatives Rash      The results of significant diagnostics from this hospitalization (including imaging, microbiology, ancillary and laboratory) are listed below for reference.    Significant Diagnostic Studies: Dg Chest 2 View  05/10/2013   CLINICAL DATA:  Shortness of breath.  EXAM: CHEST  2 VIEW  COMPARISON:  11/27/2012.  FINDINGS: Cardiomegaly. Mild right basilar scarring. No active infiltrates or failure. No effusion or pneumothorax. No osseous findings.  IMPRESSION: Cardiomegaly. No active cardiopulmonary disease. Improved aeration from priors.   Electronically Signed   By: Rolla Flatten M.D.   On: 05/10/2013 01:54    Microbiology: Recent Results (from the  past 240 hour(s))  MRSA PCR SCREENING     Status: None   Collection Time    05/10/13  6:34 AM      Result Value Range Status   MRSA by PCR NEGATIVE  NEGATIVE Final   Comment:            The GeneXpert MRSA Assay (FDA     approved for NASAL specimens     only), is one component of a     comprehensive MRSA colonization     surveillance program. It is not     intended to diagnose MRSA     infection nor to guide or     monitor treatment for     MRSA infections.     Labs: Basic Metabolic Panel:  Recent Labs Lab 05/09/13 0930 05/10/13 0127  NA 142 139  K 4.7 4.4  CL 101 100  CO2 23 22  GLUCOSE 121* 59*  BUN 39* 46*  CREATININE 12.65* 13.51*  CALCIUM 9.0 8.4   Liver Function Tests:  Recent Labs Lab 05/09/13 0930 05/10/13 0127  AST 11 11  ALT 6 6  ALKPHOS 96 92  BILITOT 0.3 0.3  PROT 6.8 6.5  ALBUMIN 3.5 3.4*    Recent Labs Lab 05/09/13 0930  LIPASE 56   No results found for this basename: AMMONIA,  in the last 168 hours CBC:  Recent Labs Lab 05/09/13 0930 05/10/13 0127  WBC 3.3* 3.9*  NEUTROABS 1.6*  --   HGB 9.7* 9.5*  HCT 28.8* 29.0*  MCV 96.0 97.6  PLT 128* 123*   Cardiac Enzymes:  Recent Labs Lab 05/10/13 0127  TROPONINI <0.30   BNP: BNP (last 3 results)  Recent Labs  05/10/13 0127  PROBNP 13135.0*   CBG: No results found for this basename: GLUCAP,  in the last 168 hours     Signed:  Inara Dike  [facility] 05/10/2013, 6:10 PM

## 2013-06-06 ENCOUNTER — Encounter (HOSPITAL_COMMUNITY): Payer: Self-pay | Admitting: Emergency Medicine

## 2013-06-06 ENCOUNTER — Observation Stay (HOSPITAL_COMMUNITY)
Admission: EM | Admit: 2013-06-06 | Discharge: 2013-06-09 | Disposition: A | Discharge status: Home / Self Care | Payer: Medicare Other | Attending: Family Medicine | Admitting: Family Medicine

## 2013-06-06 ENCOUNTER — Emergency Department (HOSPITAL_COMMUNITY): Payer: Medicare Other

## 2013-06-06 DIAGNOSIS — Z992 Dependence on renal dialysis: Secondary | ICD-10-CM

## 2013-06-06 DIAGNOSIS — N186 End stage renal disease: Secondary | ICD-10-CM

## 2013-06-06 DIAGNOSIS — I871 Compression of vein: Secondary | ICD-10-CM | POA: Insufficient documentation

## 2013-06-06 DIAGNOSIS — E875 Hyperkalemia: Secondary | ICD-10-CM | POA: Insufficient documentation

## 2013-06-06 DIAGNOSIS — T82898A Other specified complication of vascular prosthetic devices, implants and grafts, initial encounter: Secondary | ICD-10-CM | POA: Insufficient documentation

## 2013-06-06 DIAGNOSIS — Y832 Surgical operation with anastomosis, bypass or graft as the cause of abnormal reaction of the patient, or of later complication, without mention of misadventure at the time of the procedure: Secondary | ICD-10-CM | POA: Insufficient documentation

## 2013-06-06 DIAGNOSIS — I2510 Atherosclerotic heart disease of native coronary artery without angina pectoris: Secondary | ICD-10-CM

## 2013-06-06 DIAGNOSIS — R0602 Shortness of breath: Secondary | ICD-10-CM

## 2013-06-06 DIAGNOSIS — D631 Anemia in chronic kidney disease: Secondary | ICD-10-CM | POA: Insufficient documentation

## 2013-06-06 DIAGNOSIS — N2581 Secondary hyperparathyroidism of renal origin: Secondary | ICD-10-CM

## 2013-06-06 DIAGNOSIS — K219 Gastro-esophageal reflux disease without esophagitis: Secondary | ICD-10-CM

## 2013-06-06 DIAGNOSIS — M949 Disorder of cartilage, unspecified: Secondary | ICD-10-CM

## 2013-06-06 DIAGNOSIS — N189 Chronic kidney disease, unspecified: Secondary | ICD-10-CM

## 2013-06-06 DIAGNOSIS — N19 Unspecified kidney failure: Secondary | ICD-10-CM

## 2013-06-06 DIAGNOSIS — R0789 Other chest pain: Secondary | ICD-10-CM

## 2013-06-06 DIAGNOSIS — K648 Other hemorrhoids: Secondary | ICD-10-CM

## 2013-06-06 DIAGNOSIS — I120 Hypertensive chronic kidney disease with stage 5 chronic kidney disease or end stage renal disease: Secondary | ICD-10-CM | POA: Insufficient documentation

## 2013-06-06 DIAGNOSIS — E119 Type 2 diabetes mellitus without complications: Secondary | ICD-10-CM | POA: Insufficient documentation

## 2013-06-06 DIAGNOSIS — M899 Disorder of bone, unspecified: Secondary | ICD-10-CM | POA: Insufficient documentation

## 2013-06-06 DIAGNOSIS — N039 Chronic nephritic syndrome with unspecified morphologic changes: Secondary | ICD-10-CM

## 2013-06-06 DIAGNOSIS — B182 Chronic viral hepatitis C: Secondary | ICD-10-CM | POA: Insufficient documentation

## 2013-06-06 DIAGNOSIS — I2699 Other pulmonary embolism without acute cor pulmonale: Secondary | ICD-10-CM

## 2013-06-06 DIAGNOSIS — R768 Other specified abnormal immunological findings in serum: Secondary | ICD-10-CM

## 2013-06-06 DIAGNOSIS — R197 Diarrhea, unspecified: Secondary | ICD-10-CM | POA: Insufficient documentation

## 2013-06-06 DIAGNOSIS — Z86711 Personal history of pulmonary embolism: Secondary | ICD-10-CM | POA: Insufficient documentation

## 2013-06-06 DIAGNOSIS — R112 Nausea with vomiting, unspecified: Principal | ICD-10-CM

## 2013-06-06 DIAGNOSIS — K649 Unspecified hemorrhoids: Secondary | ICD-10-CM | POA: Insufficient documentation

## 2013-06-06 LAB — COMPREHENSIVE METABOLIC PANEL
ALBUMIN: 3.6 g/dL (ref 3.5–5.2)
ALK PHOS: 102 U/L (ref 39–117)
ALT: 7 U/L (ref 0–53)
AST: 12 U/L (ref 0–37)
BILIRUBIN TOTAL: 0.3 mg/dL (ref 0.3–1.2)
BUN: 44 mg/dL — ABNORMAL HIGH (ref 6–23)
CO2: 24 mEq/L (ref 19–32)
Calcium: 9.1 mg/dL (ref 8.4–10.5)
Chloride: 101 mEq/L (ref 96–112)
Creatinine, Ser: 12.78 mg/dL — ABNORMAL HIGH (ref 0.50–1.35)
GFR calc Af Amer: 4 mL/min — ABNORMAL LOW (ref >90)
GFR calc non Af Amer: 4 mL/min — ABNORMAL LOW (ref >90)
Glucose, Bld: 81 mg/dL (ref 70–99)
POTASSIUM: 5.4 meq/L — AB (ref 3.7–5.3)
Sodium: 141 mEq/L (ref 137–147)
Total Protein: 7.1 g/dL (ref 6.0–8.3)

## 2013-06-06 LAB — CBC WITH DIFFERENTIAL/PLATELET
BASOS ABS: 0 10*3/uL (ref 0.0–0.1)
BASOS PCT: 0 % (ref 0–1)
Eosinophils Absolute: 0.2 10*3/uL (ref 0.0–0.7)
Eosinophils Relative: 4 % (ref 0–5)
HCT: 29.4 % — ABNORMAL LOW (ref 39.0–52.0)
HEMOGLOBIN: 9.5 g/dL — AB (ref 13.0–17.0)
Lymphocytes Relative: 29 % (ref 12–46)
Lymphs Abs: 1.6 10*3/uL (ref 0.7–4.0)
MCH: 31.6 pg (ref 26.0–34.0)
MCHC: 32.3 g/dL (ref 30.0–36.0)
MCV: 97.7 fL (ref 78.0–100.0)
MONOS PCT: 12 % (ref 3–12)
Monocytes Absolute: 0.6 10*3/uL (ref 0.1–1.0)
NEUTROS ABS: 2.9 10*3/uL (ref 1.7–7.7)
Neutrophils Relative %: 55 % (ref 43–77)
Platelets: 132 10*3/uL — ABNORMAL LOW (ref 150–400)
RBC: 3.01 MIL/uL — ABNORMAL LOW (ref 4.22–5.81)
RDW: 14.5 % (ref 11.5–15.5)
WBC: 5.4 10*3/uL (ref 4.0–10.5)

## 2013-06-06 MED ORDER — ONDANSETRON 4 MG PO TBDP
8.0000 mg | ORAL_TABLET | Freq: Once | ORAL | Status: AC
  Administered 2013-06-06: 8 mg via ORAL
  Filled 2013-06-06: qty 2

## 2013-06-06 NOTE — ED Notes (Signed)
Doctor ordered EKG at 2313 and pt is of to CT scan at 2321.  Will do EKG when pt arrives back to [facility]

## 2013-06-06 NOTE — ED Notes (Signed)
Pt. reports emesis with diarrhea onset yesterday , denies fever /chills or body aches , pt. missed his hemodialysis this Thurs. And Sat. , respirations unlabored /alert and oriented . Ambulatory.

## 2013-06-06 NOTE — ED Notes (Signed)
Unable to get labs ?

## 2013-06-07 ENCOUNTER — Encounter (HOSPITAL_COMMUNITY): Payer: Self-pay | Admitting: General Practice

## 2013-06-07 DIAGNOSIS — R112 Nausea with vomiting, unspecified: Secondary | ICD-10-CM

## 2013-06-07 DIAGNOSIS — I2510 Atherosclerotic heart disease of native coronary artery without angina pectoris: Secondary | ICD-10-CM

## 2013-06-07 DIAGNOSIS — N2581 Secondary hyperparathyroidism of renal origin: Secondary | ICD-10-CM

## 2013-06-07 DIAGNOSIS — K648 Other hemorrhoids: Secondary | ICD-10-CM

## 2013-06-07 DIAGNOSIS — N039 Chronic nephritic syndrome with unspecified morphologic changes: Secondary | ICD-10-CM

## 2013-06-07 DIAGNOSIS — N189 Chronic kidney disease, unspecified: Secondary | ICD-10-CM

## 2013-06-07 DIAGNOSIS — K219 Gastro-esophageal reflux disease without esophagitis: Secondary | ICD-10-CM

## 2013-06-07 DIAGNOSIS — D631 Anemia in chronic kidney disease: Secondary | ICD-10-CM

## 2013-06-07 DIAGNOSIS — N186 End stage renal disease: Secondary | ICD-10-CM | POA: Diagnosis present

## 2013-06-07 LAB — BASIC METABOLIC PANEL
BUN: 48 mg/dL — AB (ref 6–23)
CO2: 21 mEq/L (ref 19–32)
CREATININE: 13.23 mg/dL — AB (ref 0.50–1.35)
Calcium: 8.7 mg/dL (ref 8.4–10.5)
Chloride: 103 mEq/L (ref 96–112)
GFR calc Af Amer: 4 mL/min — ABNORMAL LOW (ref >90)
GFR, EST NON AFRICAN AMERICAN: 4 mL/min — AB (ref >90)
Glucose, Bld: 77 mg/dL (ref 70–99)
Potassium: 6 mEq/L — ABNORMAL HIGH (ref 3.7–5.3)
Sodium: 140 mEq/L (ref 137–147)

## 2013-06-07 LAB — CBC
HCT: 27.4 % — ABNORMAL LOW (ref 39.0–52.0)
Hemoglobin: 8.9 g/dL — ABNORMAL LOW (ref 13.0–17.0)
MCH: 32.2 pg (ref 26.0–34.0)
MCHC: 32.5 g/dL (ref 30.0–36.0)
MCV: 99.3 fL (ref 78.0–100.0)
Platelets: 127 10*3/uL — ABNORMAL LOW (ref 150–400)
RBC: 2.76 MIL/uL — ABNORMAL LOW (ref 4.22–5.81)
RDW: 14.8 % (ref 11.5–15.5)
WBC: 4.8 10*3/uL (ref 4.0–10.5)

## 2013-06-07 LAB — HEPATITIS B SURFACE ANTIGEN: HEP B S AG: NEGATIVE

## 2013-06-07 MED ORDER — ACETAMINOPHEN 650 MG RE SUPP: 650.0000 mg | Freq: Four times a day (QID) | RECTAL | Status: DC | PRN

## 2013-06-07 MED ORDER — ONDANSETRON HCL 4 MG PO TABS: 4.0000 mg | ORAL_TABLET | Freq: Four times a day (QID) | ORAL | Status: DC | PRN

## 2013-06-07 MED ORDER — ONDANSETRON HCL 4 MG/2ML IJ SOLN: 4.0000 mg | Freq: Four times a day (QID) | INTRAMUSCULAR | Status: DC | PRN

## 2013-06-07 MED ORDER — ALBUTEROL SULFATE (2.5 MG/3ML) 0.083% IN NEBU: 2.5000 mg | INHALATION_SOLUTION | RESPIRATORY_TRACT | Status: DC | PRN

## 2013-06-07 MED ORDER — DARBEPOETIN ALFA-POLYSORBATE 60 MCG/0.3ML IJ SOLN
INTRAMUSCULAR | Status: AC
  Administered 2013-06-07: 60 ug via INTRAVENOUS
  Filled 2013-06-07: qty 0.3

## 2013-06-07 MED ORDER — PANTOPRAZOLE SODIUM 40 MG PO TBEC
40.0000 mg | DELAYED_RELEASE_TABLET | Freq: Every day | ORAL | Status: DC
  Administered 2013-06-07 – 2013-06-09 (×3): 40 mg via ORAL
  Filled 2013-06-07 (×2): qty 1

## 2013-06-07 MED ORDER — RENA-VITE PO TABS
1.0000 | ORAL_TABLET | Freq: Every day | ORAL | Status: DC
  Administered 2013-06-07 – 2013-06-08 (×2): 1 via ORAL
  Filled 2013-06-07 (×3): qty 1

## 2013-06-07 MED ORDER — CALCIUM ACETATE 667 MG PO CAPS
667.0000 mg | ORAL_CAPSULE | ORAL | Status: DC | PRN
  Filled 2013-06-07: qty 1

## 2013-06-07 MED ORDER — OXYCODONE HCL 5 MG PO TABS
ORAL_TABLET | ORAL | Status: AC
  Administered 2013-06-07: 5 mg via ORAL
  Filled 2013-06-07: qty 1

## 2013-06-07 MED ORDER — ACETAMINOPHEN 325 MG PO TABS
650.0000 mg | ORAL_TABLET | Freq: Four times a day (QID) | ORAL | Status: DC | PRN
  Administered 2013-06-07: 650 mg via ORAL
  Filled 2013-06-07: qty 2

## 2013-06-07 MED ORDER — HYDROCORTISONE 2.5 % RE CREA: TOPICAL_CREAM | Freq: Three times a day (TID) | RECTAL | Status: DC | PRN

## 2013-06-07 MED ORDER — AMLODIPINE BESYLATE 10 MG PO TABS
10.0000 mg | ORAL_TABLET | Freq: Every day | ORAL | Status: DC
  Administered 2013-06-08 – 2013-06-09 (×2): 10 mg via ORAL
  Filled 2013-06-07 (×4): qty 1

## 2013-06-07 MED ORDER — CALCIUM ACETATE 667 MG PO CAPS
667.0000 mg | ORAL_CAPSULE | Freq: Three times a day (TID) | ORAL | Status: DC
Start: 2013-06-07 — End: 2013-06-09
  Administered 2013-06-08: 667 mg via ORAL
  Filled 2013-06-07 (×11): qty 1

## 2013-06-07 MED ORDER — DARBEPOETIN ALFA-POLYSORBATE 60 MCG/0.3ML IJ SOLN
60.0000 ug | INTRAMUSCULAR | Status: DC
  Administered 2013-06-07: 60 ug via INTRAVENOUS

## 2013-06-07 MED ORDER — OXYCODONE HCL 5 MG PO TABS
5.0000 mg | ORAL_TABLET | ORAL | Status: DC | PRN
  Administered 2013-06-07 (×2): 5 mg via ORAL
  Filled 2013-06-07: qty 1

## 2013-06-07 MED ORDER — SODIUM CHLORIDE 0.9 % IJ SOLN
3.0000 mL | Freq: Two times a day (BID) | INTRAMUSCULAR | Status: DC
  Administered 2013-06-07 – 2013-06-08 (×2): 3 mL via INTRAVENOUS

## 2013-06-07 NOTE — Progress Notes (Signed)
Patient arrived to the floor via stretcher from the ED at approximately 0155.  He is A&O x 4 and ambulatory without assistance.  He is from home.  Recently moved to [facility] from [place] and dialysis center has not been set up.  He has missed several days of dialysis.  No skin issues except dry skin.  Has not fallen in the past.  He has his glasses, laptop, ring to right ring finger and money in his right sock that is on his foot.  He does request that we send the money to the safe in security.  Will do this before the night is over.  Oriented to the unit.  No complaints of pain.  Explained that we would place him on a bed alarm at least for the first 24 hours.  He understood and was in agreement.  Will continue to monitor patient.  Veida Spira RN, Thea Gist.

## 2013-06-07 NOTE — ED Notes (Signed)
Transporting patient to new room assignment. 

## 2013-06-07 NOTE — Consult Note (Signed)
[facility] Renal Consultation Note  Indication for Consultation:  Management of ESRD/hemodialysis; anemia, hypertension/volume and secondary hyperparathyroidism  HPI: Logan Martin is a 58 y.o. male with ESRD on  HD( TTS IN [place]) with last hd  Past Tues, 06/02/13 presented in the ER yesterday with complaints of nausea vomiting and diarrhea. Patient denies any fever, any chills, any sick contacts, or any coffee-ground emesis. He reports  he is currently permanently moving to [place], reportedly  the Education officer, museum in [place]  set up his dialysis here, but apparently paper work did not get to [employer]. HE has been mostly between [place] and [place] over the last few month, patient denies any chest pain, and denies any shortness of breath, his potassium was 5.4, in the ER  Yesterday andhis EKG did not show any acute changes. This am K=6.0 and currently he cos Right sided ear discomfort and R arm swelling he is 3.5 kg over his reported edw.this am. He previously did Home Hemodialysis when he was not traveling so often.    Past Medical History  Diagnosis Date  . Hx of bladder repair surgery   . Hepatitis C   . Cocaine abuse in remission     Quit date in 1995  . Alcohol abuse     quit date in 1995  . 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 Started HD in [place], [place].  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  On NxStage home hemo since 2010. Using RUA AVF.    [name] Hypertension   . Pulmonary embolism Feb 2012    Treated with coumadin x 1 year  . Renal insufficiency   . Diabetes mellitus without complication     Past Surgical History  Procedure Laterality Date  . Hernia repair    . Bladder surgery    . Av fistula placement       History reviewed. No pertinent family history.    reports that he quit smoking about 20 years ago. He has never used smokeless tobacco. He reports that he does not drink alcohol  or use illicit drugs.   Allergies  Allergen Reactions  . Heparin Rash    Pork products   . Pork-Derived Products Rash  . Sulfa Antibiotics Rash  . Sulfonamide Derivatives Rash    Prior to Admission medications   Medication Sig Start Date End Date Taking? Authorizing Provider  amLODipine (NORVASC) 10 MG tablet Take 10 mg by mouth daily.   Yes Historical Provider, MD  calcium acetate (PHOSLO) 667 MG capsule Take 667 mg by mouth 5 (five) times daily. Takes 1 cap with 3 meals, 1 cap with 2 snacks   Yes Historical Provider, MD  omeprazole (PRILOSEC) 20 MG capsule Take 20 mg by mouth daily.   Yes Historical Provider, MD  oxycodone (ROXICODONE) 30 MG immediate release tablet Take 30 mg by mouth every 8 (eight) hours as needed for pain.    Yes Historical Provider, MD    Results for orders placed during the hospital encounter of 06/06/13 (from the past 48 hour(s))  CBC WITH DIFFERENTIAL     Status: Abnormal   Collection Time    06/06/13 10:37 PM      Result Value Range   WBC 5.4  4.0 - 10.5 K/uL   RBC 3.01 (*) 4.22 - 5.81 MIL/uL   Hemoglobin 9.5 (*) 13.0 - 17.0 g/dL   HCT 29.4 (*) 39.0 - 52.0 %  MCV 97.7  78.0 - 100.0 fL   MCH 31.6  26.0 - 34.0 pg   MCHC 32.3  30.0 - 36.0 g/dL   RDW 14.5  11.5 - 15.5 %   Platelets 132 (*) 150 - 400 K/uL   Neutrophils Relative % 55  43 - 77 %   Neutro Abs 2.9  1.7 - 7.7 K/uL   Lymphocytes Relative 29  12 - 46 %   Lymphs Abs 1.6  0.7 - 4.0 K/uL   Monocytes Relative 12  3 - 12 %   Monocytes Absolute 0.6  0.1 - 1.0 K/uL   Eosinophils Relative 4  0 - 5 %   Eosinophils Absolute 0.2  0.0 - 0.7 K/uL   Basophils Relative 0  0 - 1 %   Basophils Absolute 0.0  0.0 - 0.1 K/uL  COMPREHENSIVE METABOLIC PANEL     Status: Abnormal   Collection Time    06/06/13 10:37 PM      Result Value Range   Sodium 141  137 - 147 mEq/L   Potassium 5.4 (*) 3.7 - 5.3 mEq/L   Chloride 101  96 - 112 mEq/L   CO2 24  19 - 32 mEq/L   Glucose, Bld 81  70 - 99 mg/dL   BUN 44 (*)  6 - 23 mg/dL   Creatinine, Ser 12.78 (*) 0.50 - 1.35 mg/dL   Calcium 9.1  8.4 - 10.5 mg/dL   Total Protein 7.1  6.0 - 8.3 g/dL   Albumin 3.6  3.5 - 5.2 g/dL   AST 12  0 - 37 U/L   ALT 7  0 - 53 U/L   Alkaline Phosphatase 102  39 - 117 U/L   Total Bilirubin 0.3  0.3 - 1.2 mg/dL   GFR calc non Af Amer 4 (*) >90 mL/min   GFR calc Af Amer 4 (*) >90 mL/min   Comment: (NOTE)     The eGFR has been calculated using the CKD EPI equation.     This calculation has not been validated in all clinical situations.     eGFR's persistently <90 mL/min signify possible Chronic Kidney     Disease.  BASIC METABOLIC PANEL     Status: Abnormal   Collection Time    06/07/13  5:48 AM      Result Value Range   Sodium 140  137 - 147 mEq/L   Potassium 6.0 (*) 3.7 - 5.3 mEq/L   Chloride 103  96 - 112 mEq/L   CO2 21  19 - 32 mEq/L   Glucose, Bld 77  70 - 99 mg/dL   BUN 48 (*) 6 - 23 mg/dL   Creatinine, Ser 13.23 (*) 0.50 - 1.35 mg/dL   Calcium 8.7  8.4 - 10.5 mg/dL   GFR calc non Af Amer 4 (*) >90 mL/min   GFR calc Af Amer 4 (*) >90 mL/min   Comment: (NOTE)     The eGFR has been calculated using the CKD EPI equation.     This calculation has not been validated in all clinical situations.     eGFR's persistently <90 mL/min signify possible Chronic Kidney     Disease.  CBC     Status: Abnormal   Collection Time    06/07/13  5:48 AM      Result Value Range   WBC 4.8  4.0 - 10.5 K/uL   RBC 2.76 (*) 4.22 - 5.81 MIL/uL   Hemoglobin 8.9 (*) 13.0 - 17.0  g/dL   HCT 27.4 (*) 39.0 - 52.0 %   MCV 99.3  78.0 - 100.0 fL   MCH 32.2  26.0 - 34.0 pg   MCHC 32.5  30.0 - 36.0 g/dL   RDW 14.8  11.5 - 15.5 %   Platelets 127 (*) 150 - 400 K/uL    ROS:  See hpi  positives  Physical Exam: Filed Vitals:   06/07/13 0500  BP: 162/73  Pulse: 74  Temp: 98 F (36.7 C)  Resp: 18     General:Alert BM nad Appropriate  HEENT: [place],mmm Eyes: non icteric eomi Neck: Right neck discomfort to palpitation with mild  swelling Heart: RRR 1/6 sem lsb , no rub Lungs: Faint r basilar rales Abdomen:  bs nl , soft ,nt,nd Extremities: No pedal edema, with Right upper extrem. Swelling to elbow Skin: No overtr rash or ulcer Neuro: Alert, OX3 , ambulates to Nursing desk , no acute focal deficits Dialysis Access: Pos. Bruit.R UA AVF with large aneurysms   Dialysis Orders: [place]  [place] . EDW 54.5 HD [place] ?  Time 4 hrs Heparin 0 MUSLIM so no pork; not true allergy( PORK ALLERGY). Access R UA AVF BFR ? DFR ?    [employer] ? mcg IV/HD Epogen 10,000   Units IV/HD  Venofer   74m weekly    Assessment/Plan 1. Hyperkalemia= sec to missed hd  Hd today 2. ESRD -  Normal schedule TTS  / check with [facility] in am ? [facility] town  3. Hypertension/volume  - 162/73 bp, amlodipine 190mhs and hd uf to edw 4. R Upper ext . 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 Anemia  - hgb 8.9 start ESA then q thurs / he reports weekly Iron 6. Metabolic bone disease -  Phoslo binder / on vit d check with paper work for center for vit d  7. R Neck discomfort= per admit wu 8. Chronic Hep C 9. Remote history of PE (on coumadin for a year; since discontinued)   DaErnest HaberPA-C [phone]/03/2014, 8:25 AM  I have seen and examined this patient and agree with plan as outlined above with highlighted additions.  [id number] AAM with ESRD d/th/o chronic reflux, chronic Hep C, HTN - [place], and has just moved back to [place] to take care of his daughter's children so she can take a new job.  Arrangements were apparently incomplete in the outpt setting, so pt here for HD.  He has done both incenter HD and NxStage.  He developed edema of his RUE 2 days ago.  Has h/o brachiocephalic stenosis PTA in 6/3/8182with f'gram prompted at that time also by RUE swelling.Will order.  On HD right now for K of 6 and volume overload. CLIP. Geanette Buonocore B,MD 06/07/2013 12:49 PM

## 2013-06-07 NOTE — Progress Notes (Signed)
Utilization review completed.  

## 2013-06-07 NOTE — Progress Notes (Signed)
[facility] PROGRESS NOTE  Logan Martin [id number] DOB: 15-Aug-1955 DOA: 06/06/2013 PCP: Lucrezia Starch, MD  Assessment/Plan: 1. Nausea vomiting and diarrhea. This is most likely due to to uremia, from missing his hemodialysis, who has him on when necessary Zofran, discussed with nephrology- plan for hemodialysis in the morning. Patient has mild hyperkalemia, but no EKG changes, he will be monitored on telemetry  2. End-stage renal disease. Continue hemodialysis on Tuesday Thursday and Saturday, will consult social worker  3. Gastroesophageal reflux disease. Continue with PPI  4. History of coronary artery disease. Appears to be stable, no complaints of chest pain or shortness of breath.  5. Secondary hyperparathyroidism: of renal origin, continue with phoslo.  6. Anemia of chronic renal failure. Hemoglobin is stable  7. Hemorrhoids - will prescribe topical steroid preparation to use as needed  DVT Prophylaxis SCDs   HPI/Subjective: Pt reports that his hemorrhoids are really bothering him  Objective: Filed Vitals:   06/07/13 0500  BP: 162/73  Pulse: 74  Temp: 98 F (36.7 C)  Resp: 18   No intake or output data in the 24 hours ending 06/07/13 0941 Filed Weights   06/06/13 1918 06/07/13 0201  Weight: 132 lb (59.875 kg) 128 lb 3.2 oz (58.151 kg)   Exam:  General:Alert, no apparent distress, chronically ill appearing HEENT: MMM Eyes: non icteric eomi  Neck: Right neck discomfort to palpitation with mild swelling  Heart: RRR 1/6 sem lsb , no rub  Lungs: bibasilar rales Abdomen: bs nl , soft ,nt,nd  Extremities: No pedal edema, with Right upper extrem. swelling  Skin: No gross lesions Neuro:  no focal deficits   Data Reviewed: Basic Metabolic Panel:  Recent Labs Lab 06/06/13 2237 06/07/13 0548  NA 141 140  K 5.4* 6.0*  CL 101 103  CO2 24 21  GLUCOSE 81 77  BUN 44* 48*  CREATININE 12.78* 13.23*  CALCIUM 9.1 8.7   Liver Function Tests:  Recent  Labs Lab 06/06/13 2237  AST 12  ALT 7  ALKPHOS 102  BILITOT 0.3  PROT 7.1  ALBUMIN 3.6   No results found for this basename: LIPASE, AMYLASE,  in the last 168 hours No results found for this basename: AMMONIA,  in the last 168 hours CBC:  Recent Labs Lab 06/06/13 2237 06/07/13 0548  WBC 5.4 4.8  NEUTROABS 2.9  --   HGB 9.5* 8.9*  HCT 29.4* 27.4*  MCV 97.7 99.3  PLT 132* 127*   Cardiac Enzymes: No results found for this basename: CKTOTAL, CKMB, CKMBINDEX, TROPONINI,  in the last 168 hours BNP (last 3 results)  Recent Labs  05/10/13 0127  PROBNP 13135.0*   CBG: No results found for this basename: GLUCAP,  in the last 168 hours  No results found for this or any previous visit (from the past 240 hour(s)).   Studies: Dg Chest 2 View  06/06/2013   CLINICAL DATA:  Nausea, vomiting and diarrhea.  EXAM: CHEST  2 VIEW  COMPARISON:  PA and lateral chest 05/10/2013.  FINDINGS: Lungs are clear. Heart size is mildly enlarged. No pneumothorax or pleural effusion. No focal bony abnormality.  IMPRESSION: No acute disease.   Electronically Signed   By: Inge Rise M.D.   On: 06/06/2013 23:33    Scheduled Meds: . amLODipine  10 mg Oral Daily  . calcium acetate  667 mg Oral TID WC  . [START ON 06/13/2013] darbepoetin (ARANESP) injection - DIALYSIS  60 mcg Intravenous Q Sat-HD  . multivitamin  1 tablet Oral QHS  . pantoprazole  40 mg Oral Daily  . sodium chloride  3 mL Intravenous Q12H   Continuous Infusions:   Principal Problem:   Nausea and vomiting Active Problems:   CORONARY ARTERY DISEASE   GERD   Secondary hyperparathyroidism (of renal origin)   Anemia associated with chronic renal failure   ESRD (end stage renal disease)   [facility] Pager [phone]. If 7PM-7AM, please contact night-coverage at [email or web address], password [facility] 06/07/2013, 9:41 AM  LOS: 1 day

## 2013-06-07 NOTE — Procedures (Signed)
I have personally attended this patient's dialysis session.  Right aneurysmal AVF 400 K 6 ->2K bath RUE edema significant - prior B-C stenosis PTA 2013 -->check fistulagram No heparin due to Muslim status (no pork)  Logan Martin B

## 2013-06-07 NOTE — H&P (Signed)
[facility]                                                                                    Patient Demographics  Logan Martin, is a 58 y.o. male  MRN: [id number]   DOB - Feb 26, 1956  Admit Date - 06/06/2013  Outpatient Primary MD for the patient is Lucrezia Starch, MD   With History of -  Past Medical History  Diagnosis Date  . Hx of bladder repair surgery   . Hepatitis C   . Cocaine abuse in remission     Quit date in 1995  . Alcohol abuse     quit date in 1995  . 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 Started HD in [place], [place].  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  On NxStage home hemo since 2010. Using RUA AVF.    [name] Hypertension   . Pulmonary embolism Feb 2012    Treated with coumadin x 1 year  . Renal insufficiency   . Diabetes mellitus without complication       Past Surgical History  Procedure Laterality Date  . Hernia repair    . Bladder surgery    . Av fistula placement      in for   Chief Complaint  Patient presents with  . Emesis  . Diarrhea     HPI  Logan Martin  is a 58 y.o. male, he presents with complaints of nausea vomiting and diarrhea, patient reports symptoms started over the last 24 hours, patient denies any fever, any chills, any sick contacts, any coffee-ground emesis, any hematochezia, denies eating outside, the patient reports he missed his hemodialysis for Thursday and Saturday, reports he received his hemodialysis on Tuesday in [place], reports he is currently permanently move to [place], reportedly for the social worker in [place] that she set up his dialysis here, but apparently it was not read yet, patient has been mostly between [place] over the last few month, patient denies any chest pain, and denies any shortness of breath, his potassium was 5.4, his EKG did not show any acute changes, denies any edema or orthopnea.    Review of Systems    In addition to  the HPI above,  No Fever-chills, complaints of fatigue and generalized weakness, and decreased appetite No Headache, No changes with Vision or hearing, No problems swallowing food or Liquids, No Chest pain, Cough or Shortness of Breath, No Abdominal pain, complaints of Nausea and Vommitting, and diarrhea No Blood in stool , patient is anuric No dysuria, No new skin rashes or bruises, No new joints pains-aches,  No new weakness, tingling, numbness in any extremity, No recent weight gain or loss, No polyuria, polydypsia or polyphagia, No significant Mental Stressors.  A full 10 point Review of Systems was done, except as stated above, all other Review of Systems were negative.   Social History History  Substance Use Topics  . Smoking status: Former Smoker    Quit date: 05/28/1993  . Smokeless tobacco: Never Used  . Alcohol Use: No     Family History No family history on file.  Denies any family history of coronary artery disease at young age  Prior to Admission medications   Medication Sig Start Date End Date Taking? Authorizing Provider  amLODipine (NORVASC) 10 MG tablet Take 10 mg by mouth daily.   Yes Historical Provider, MD  calcium acetate (PHOSLO) 667 MG capsule Take 667 mg by mouth 5 (five) times daily. Takes 1 cap with 3 meals, 1 cap with 2 snacks   Yes Historical Provider, MD  omeprazole (PRILOSEC) 20 MG capsule Take 20 mg by mouth daily.   Yes Historical Provider, MD  oxycodone (ROXICODONE) 30 MG immediate release tablet Take 30 mg by mouth every 8 (eight) hours as needed for pain.    Yes Historical Provider, MD    Allergies  Allergen Reactions  . Heparin Rash    Pork products   . Pork-Derived Products Rash  . Sulfa Antibiotics Rash  . Sulfonamide Derivatives Rash    Physical Exam  Vitals  Blood pressure 172/77, pulse 73, temperature 98.5 F (36.9 C), temperature source Oral, resp. rate 16, weight 59.875 kg (132 lb), SpO2 99.00%.   1. General well  nourished male lying in bed in NAD,  sleeping comfortably 2. Normal affect and insight, Not Suicidal or Homicidal, Awake Alert, Oriented X 3.  3. No F.N deficits, ALL C.Nerves Intact, Strength 5/5 all 4 extremities, Sensation intact all 4 extremities, Plantars down going.  4. Ears and Eyes appear Normal, Conjunctivae clear, PERRLA. Moist Oral Mucosa.  5. Supple Neck, No JVD, No cervical lymphadenopathy appriciated, No Carotid Bruits.  6. Symmetrical Chest wall movement, Good air movement bilaterally, CTAB.  7. RRR, No Gallops, Rubs or Murmurs, No Parasternal Heave.  8. Positive Bowel Sounds, Abdomen Soft, Non tender, No organomegaly appriciated,No rebound -guarding or rigidity.  9.  No Cyanosis, Normal Skin Turgor, No Skin Rash or Bruise.  10. Good muscle tone,  joints appear normal , no effusions, Normal ROM.  11. No Palpable Lymph Nodes in Neck or Axillae    Data Review  CBC  Recent Labs Lab 06/06/13 2237  WBC 5.4  HGB 9.5*  HCT 29.4*  PLT 132*  MCV 97.7  MCH 31.6  MCHC 32.3  RDW 14.5  LYMPHSABS 1.6  MONOABS 0.6  EOSABS 0.2  BASOSABS 0.0   ------------------------------------------------------------------------------------------------------------------  Chemistries   Recent Labs Lab 06/06/13 2237  NA 141  K 5.4*  CL 101  CO2 24  GLUCOSE 81  BUN 44*  CREATININE 12.78*  CALCIUM 9.1  AST 12  ALT 7  ALKPHOS 102  BILITOT 0.3   ------------------------------------------------------------------------------------------------------------------ CrCl is unknown because both a height and weight (above a minimum accepted value) are required for this calculation. ------------------------------------------------------------------------------------------------------------------ No results found for this basename: TSH, T4TOTAL, FREET3, T3FREE, THYROIDAB,  in the last 72 hours   Coagulation profile No results found for this basename: INR, PROTIME,  in the last  168 hours ------------------------------------------------------------------------------------------------------------------- No results found for this basename: DDIMER,  in the last 72 hours -------------------------------------------------------------------------------------------------------------------  Cardiac Enzymes No results found for this basename: CK, CKMB, TROPONINI, MYOGLOBIN,  in the last 168 hours ------------------------------------------------------------------------------------------------------------------ No components found with this basename: POCBNP,    ---------------------------------------------------------------------------------------------------------------  Urinalysis No results found for this basename: colorurine, appearanceur, labspec, phurine, glucoseu, hgbur, bilirubinur, ketonesur, proteinur, urobilinogen, nitrite, leukocytesur    ----------------------------------------------------------------------------------------------------------------  Imaging results:   Dg Chest 2 View  06/06/2013   CLINICAL DATA:  Nausea, vomiting and diarrhea.  EXAM: CHEST  2 VIEW  COMPARISON:  PA and lateral chest 05/10/2013.  FINDINGS: Lungs are clear. Heart size is mildly enlarged. No pneumothorax or pleural effusion. No focal bony abnormality.  IMPRESSION: No acute disease.   Electronically Signed   By: Inge Rise M.D.   On: 06/06/2013 23:33   Dg Chest 2 View  05/10/2013   CLINICAL DATA:  Shortness of breath.  EXAM: CHEST  2 VIEW  COMPARISON:  11/27/2012.  FINDINGS: Cardiomegaly. Mild right basilar scarring. No active infiltrates or failure. No effusion or pneumothorax. No osseous findings.  IMPRESSION: Cardiomegaly. No active cardiopulmonary disease. Improved aeration from priors.   Electronically Signed   By: Rolla Flatten M.D.   On: 05/10/2013 01:54    My personal review of EKG: Rhythm NSR, Rate  77 , no peaked T waves    Assessment & Plan  Principal  Problem:   Nausea and vomiting Active Problems:   CORONARY ARTERY DISEASE   GERD   Secondary hyperparathyroidism (of renal origin)   Anemia associated with chronic renal failure   ESRD (end stage renal disease)    1. nausea vomiting and diarrhea. This is most likely due to to uremia, from missing his hemodialysis, who has him on when necessary Zofran, patient does not appear to be in any volume overload currently, discussed with nephrology ,Will plan for hemodialysis in the morning. Patient has mild hyperkalemia, but no EKG changes, he will be monitored on telemetry 2. End-stage renal disease. Continue hemodialysis on Tuesday Thursday and Saturday, will consult social worker 3. Gastroesophageal reflux disease. Continue with PPI 4. History of coronary artery disease. Appears to be stable, no complaints of chest pain or shortness of breath. 5. Secondary hyperparathyroidism: of renal origin, continue with phoslo. 6. Anemia of chronic renal failure. Hemoglobin is stable  DVT Prophylaxis SCDs   AM Labs Ordered, also please review Full Orders  Family Communication: Admission, patients condition and plan of care including tests being ordered have been discussed with the patient who indicate understanding and agree with the plan and Code Status.  Code Status full code Likely DC to home  Condition GUARDED  Time spent in minutes : 23min    Charmion Hapke M.D on 06/07/2013 at 1:01 AM    And look for the night coverage person covering me after hours  [facility] Office  [phone]

## 2013-06-07 NOTE — ED Provider Notes (Signed)
CSN: [id number]     Arrival date & time 06/06/13  1905 History   First MD Initiated Contact with Patient 06/06/13 2257     Chief Complaint  Patient presents with  . Emesis  . Diarrhea   (Consider location/radiation/quality/duration/timing/severity/associated sxs/prior Treatment) HPI 58 year old male presents to emergency department with complaint of nausea, vomiting, and diarrhea.  Diarrhea started yesterday.  He has had several episodes.  Vomiting starting tonight at 6 PM.  No fevers, chills, sick contacts, or unusual foods.  He normally has nausea, vomiting, and diarrhea, when he misses dialysis.  Patient has end-stage renal disease, normally receives dialysis Tuesday, Thursday, Saturday.  Patient missed dialysis, Thursday, and Saturday, due to moving here from [place] and not having a dialysis center appropriately set up yet.  Patient reports he had paperwork faxed up, and is confused why he does not have dialysis yet.  He does not have any foreseeable dialysis center as of yet, he is waiting for followup with a nephrologist and an assignment to a Center.  He denies any chest pain.  No shortness of breath.  Patient reports based on past history he knows that he become short of breath, tomorrow, and is trying to get dialysis before he is in extremis. Past Medical History  Diagnosis Date  . Hx of bladder repair surgery   . Hepatitis C   . Cocaine abuse in remission     Quit date in 1995  . Alcohol abuse     quit date in 1995  . 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 Started HD in [place], [place].  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  On NxStage home hemo since 2010. Using RUA AVF.    [name] Hypertension   . Pulmonary embolism Feb 2012    Treated with coumadin x 1 year  . Renal insufficiency   . Diabetes mellitus without complication    Past Surgical History  Procedure Laterality Date  . Hernia repair    . Bladder surgery    . Av fistula  placement     No family history on file. History  Substance Use Topics  . Smoking status: Former Smoker    Quit date: 05/28/1993  . Smokeless tobacco: Never Used  . Alcohol Use: No    Review of Systems  See History of Present Illness; otherwise all other systems are reviewed and negative Allergies  Heparin; Pork-derived products; Sulfa antibiotics; and Sulfonamide derivatives  Home Medications   Current Outpatient Rx  Name  Route  Sig  Dispense  Refill  . amLODipine (NORVASC) 10 MG tablet   Oral   Take 10 mg by mouth daily.         . calcium acetate (PHOSLO) 667 MG capsule   Oral   Take 667 mg by mouth 5 (five) times daily. Takes 1 cap with 3 meals, 1 cap with 2 snacks         . omeprazole (PRILOSEC) 20 MG capsule   Oral   Take 20 mg by mouth daily.         [name] oxycodone (ROXICODONE) 30 MG immediate release tablet   Oral   Take 30 mg by mouth every 8 (eight) hours as needed for pain.           BP 172/77  Pulse 73  Temp(Src) 98.5 F (36.9 C) (Oral)  Resp 16  Wt 132 lb (59.875 kg)  SpO2 99% Physical Exam  Nursing note  and vitals reviewed. Constitutional: He is oriented to person, place, and time. He appears well-developed and well-nourished.  HENT:  Head: Normocephalic and atraumatic.  Nose: Nose normal.  Mouth/Throat: Oropharynx is clear and moist.  Eyes: Conjunctivae and EOM are normal. Pupils are equal, round, and reactive to light.  Neck: Normal range of motion. Neck supple. No JVD present. No tracheal deviation present. No thyromegaly present.  Cardiovascular: Normal rate, regular rhythm, normal heart sounds and intact distal pulses.  Exam reveals no gallop and no friction rub.   No murmur heard. Pulmonary/Chest: Effort normal. No stridor. No respiratory distress. He has no wheezes. He has rales (trace in bases). He exhibits no tenderness.  Abdominal: Soft. Bowel sounds are normal. He exhibits no distension and no mass. There is no tenderness. There is  no rebound and no guarding.  Musculoskeletal: Normal range of motion. He exhibits no edema and no tenderness.  Lymphadenopathy:    He has no cervical adenopathy.  Neurological: He is alert and oriented to person, place, and time. He exhibits normal muscle tone. Coordination normal.  Skin: Skin is warm and dry. No rash noted. No erythema. No pallor.  Psychiatric: He has a normal mood and affect. His behavior is normal. Judgment and thought content normal.    ED Course  Procedures (including critical care time) Labs Review Labs Reviewed  CBC WITH DIFFERENTIAL - Abnormal; Notable for the following:    RBC 3.01 (*)    Hemoglobin 9.5 (*)    HCT 29.4 (*)    Platelets 132 (*)    All other components within normal limits  COMPREHENSIVE METABOLIC PANEL - Abnormal; Notable for the following:    Potassium 5.4 (*)    BUN 44 (*)    Creatinine, Ser 12.78 (*)    GFR calc non Af Amer 4 (*)    GFR calc Af Amer 4 (*)    All other components within normal limits   Imaging Review Dg Chest 2 View  06/06/2013   CLINICAL DATA:  Nausea, vomiting and diarrhea.  EXAM: CHEST  2 VIEW  COMPARISON:  PA and lateral chest 05/10/2013.  FINDINGS: Lungs are clear. Heart size is mildly enlarged. No pneumothorax or pleural effusion. No focal bony abnormality.  IMPRESSION: No acute disease.   Electronically Signed   By: Inge Rise M.D.   On: 06/06/2013 23:33    EKG Interpretation    Date/Time:  Sunday June 07 2013 00:17:02 EST Ventricular Rate:  77 PR Interval:  170 QRS Duration: 85 QT Interval:  395 QTC Calculation: 447 R Axis:   66 Text Interpretation:  Sinus rhythm Anteroseptal infarct, old Nonspecific T abnormalities, lateral leads new inverted twaves laterally in v4, no peaked t waves Confirmed by Kitiara Hintze  MD, Ilee Randleman [id number]) on 06/07/2013 12:20:43 AM            MDM   1. ESRD (end stage renal disease)   2. Uremia    58 year old male end-stage renal disease, who is missed 2 days of dialysis.   He has a slightly elevated potassium.  I am concerned as he does not have a foreseeable dialysis appointment scheduled that his potassium will only continue to rise.  He has no signs of fluid overload at this time.  EKG without peaked T. wave    Kalman Drape, MD 06/07/13 [phone]

## 2013-06-08 ENCOUNTER — Encounter (HOSPITAL_COMMUNITY): Payer: Self-pay | Admitting: Radiology

## 2013-06-08 ENCOUNTER — Observation Stay (HOSPITAL_COMMUNITY): Payer: Medicare Other

## 2013-06-08 DIAGNOSIS — N19 Unspecified kidney failure: Secondary | ICD-10-CM

## 2013-06-08 LAB — RENAL FUNCTION PANEL
ALBUMIN: 3.5 g/dL (ref 3.5–5.2)
BUN: 18 mg/dL (ref 6–23)
CO2: 29 mEq/L (ref 19–32)
Calcium: 9.3 mg/dL (ref 8.4–10.5)
Chloride: 95 mEq/L — ABNORMAL LOW (ref 96–112)
Creatinine, Ser: 7.86 mg/dL — ABNORMAL HIGH (ref 0.50–1.35)
GFR calc Af Amer: 8 mL/min — ABNORMAL LOW (ref >90)
GFR, EST NON AFRICAN AMERICAN: 7 mL/min — AB (ref >90)
Glucose, Bld: 91 mg/dL (ref 70–99)
PHOSPHORUS: 4.3 mg/dL (ref 2.3–4.6)
POTASSIUM: 4.6 meq/L (ref 3.7–5.3)
SODIUM: 137 meq/L (ref 137–147)

## 2013-06-08 LAB — CBC
HCT: 28.8 % — ABNORMAL LOW (ref 39.0–52.0)
Hemoglobin: 9.6 g/dL — ABNORMAL LOW (ref 13.0–17.0)
MCH: 32 pg (ref 26.0–34.0)
MCHC: 33.3 g/dL (ref 30.0–36.0)
MCV: 96 fL (ref 78.0–100.0)
PLATELETS: 117 10*3/uL — AB (ref 150–400)
RBC: 3 MIL/uL — ABNORMAL LOW (ref 4.22–5.81)
RDW: 14.5 % (ref 11.5–15.5)
WBC: 5.2 10*3/uL (ref 4.0–10.5)

## 2013-06-08 MED ORDER — FENTANYL CITRATE 0.05 MG/ML IJ SOLN
INTRAMUSCULAR | Status: AC | PRN
  Administered 2013-06-08: 50 ug via INTRAVENOUS

## 2013-06-08 MED ORDER — MIDAZOLAM HCL 2 MG/2ML IJ SOLN
INTRAMUSCULAR | Status: AC | PRN
  Administered 2013-06-08: 1 mg via INTRAVENOUS

## 2013-06-08 MED ORDER — FENTANYL CITRATE 0.05 MG/ML IJ SOLN
INTRAMUSCULAR | Status: AC
  Filled 2013-06-08: qty 2

## 2013-06-08 MED ORDER — DARBEPOETIN ALFA-POLYSORBATE 60 MCG/0.3ML IJ SOLN: 60.0000 ug | INTRAMUSCULAR | Status: DC

## 2013-06-08 MED ORDER — IOHEXOL 300 MG/ML  SOLN
100.0000 mL | Freq: Once | INTRAMUSCULAR | Status: AC | PRN
  Administered 2013-06-08: 45 mL via INTRAVENOUS

## 2013-06-08 MED ORDER — SODIUM CHLORIDE 0.9 % IV SOLN
62.5000 mg | INTRAVENOUS | Status: DC
  Administered 2013-06-09: 62.5 mg via INTRAVENOUS
  Filled 2013-06-08 (×2): qty 5

## 2013-06-08 MED ORDER — MIDAZOLAM HCL 2 MG/2ML IJ SOLN
INTRAMUSCULAR | Status: AC
  Filled 2013-06-08: qty 2

## 2013-06-08 NOTE — Care Management Note (Signed)
   CARE MANAGEMENT NOTE 06/08/2013  Patient:  Logan Martin, Logan Martin   Account Number:  [id number]  Date Initiated:  06/08/2013  Documentation initiated by:  Logon Uttech  Subjective/Objective Assessment:   Noted referral to CM for outpatient hemodialysis     Action/Plan:   Please be aware that outpatient hemodialysis must be requested by nephrologist and arrangements will be made by hemodialysis dept staff.   Anticipated DC Date:  06/10/2013   Anticipated DC Plan:  HOME/SELF CARE         Choice offered to / List presented to:             Status of service:  In process, will continue to follow Medicare Important Message given?   (If response is "NO", the following Medicare IM given date fields will be blank) Date Medicare IM given:   Date Additional Medicare IM given:    Discharge Disposition:  HOME/SELF CARE  Per UR Regulation:    If discussed at Long Length of Stay Meetings, dates discussed:    Comments:

## 2013-06-08 NOTE — H&P (Signed)
Chief Complaint: "Right arm swelling." Referring Physician: Nephrology HPI: Logan Martin is an 58 y.o. male with ESRD on HD T/T/S, last HD 06/02/13 patient presented to ED 06/07/13 c/o N/V, diarrhea and right arm swelling x 2 days with history of previous venous obstruction requiring PTA. There patient did undergo dialysis 06/07/13 successfully, potassium improved. He denies any chest pain, shortness of breath, fever or chills. He denies any active bleeding. He denies any difficulty with iodinated contrast or previous sedation. Nephrology requested fistulogram with possible intervention.    Past Medical History:  Past Medical History  Diagnosis Date  . Hx of bladder repair surgery   . Hepatitis C   . Cocaine abuse in remission     Quit date in 1995  . Alcohol abuse     quit date in 1995  . 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 Started HD in [place], [place].  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  On NxStage home hemo since 2010. Using RUA AVF.    [name] Hypertension   . Pulmonary embolism Feb 2012    Treated with coumadin x 1 year  . Renal insufficiency   . Diabetes mellitus without complication     Past Surgical History:  Past Surgical History  Procedure Laterality Date  . Hernia repair    . Bladder surgery    . Av fistula placement      Family History: History reviewed. No pertinent family history.  Social History:  reports that he quit smoking about 20 years ago. He has never used smokeless tobacco. He reports that he does not drink alcohol or use illicit drugs.  Allergies:  Allergies  Allergen Reactions  . Heparin Rash    Pork products   . Pork-Derived Products Rash  . Sulfa Antibiotics Rash  . Sulfonamide Derivatives Rash      Medication List    ASK your doctor about these medications       amLODipine 10 MG tablet  Commonly known as:  NORVASC  Take 10 mg by mouth daily.     calcium acetate 667 MG capsule  Commonly  known as:  PHOSLO  Take 667 mg by mouth 5 (five) times daily. Takes 1 cap with 3 meals, 1 cap with 2 snacks     omeprazole 20 MG capsule  Commonly known as:  PRILOSEC  Take 20 mg by mouth daily.     oxycodone 30 MG immediate release tablet  Commonly known as:  ROXICODONE  Take 30 mg by mouth every 8 (eight) hours as needed for pain.       Please HPI for pertinent positives, otherwise complete 10 system ROS negative.  Physical Exam: BP 188/95  Pulse 95  Temp(Src) 99.2 F (37.3 C) (Oral)  Resp 18  Ht 5' 4" (1.626 m)  Wt 117 lb 15.1 oz (53.5 kg)  BMI 20.24 kg/m2  SpO2 94% Body mass index is 20.24 kg/(m^2).  General Appearance:  Alert, cooperative, no distress  Head:  Normocephalic, without obvious abnormality, atraumatic  Neck: Supple, symmetrical, trachea midline  Lungs:   Clear to auscultation bilaterally, no w/r/r, respirations unlabored without use of accessory muscles.  Chest Wall:  No tenderness or deformity  Heart:  Regular rate and rhythm, S1, S2 normal, no murmur, rub or gallop.  Abdomen:   Soft, non-tender, non distended, (+) BS  Extremities: RUE swelling, (+) Bruit AVF, atraumatic, no cyanosis  Pulses: 2+ and symmetric  Neurologic: Normal affect,  no gross deficits.   Results for orders placed during the hospital encounter of 06/06/13 (from the past 48 hour(s))  CBC WITH DIFFERENTIAL     Status: Abnormal   Collection Time    06/06/13 10:37 PM      Result Value Range   WBC 5.4  4.0 - 10.5 K/uL   RBC 3.01 (*) 4.22 - 5.81 MIL/uL   Hemoglobin 9.5 (*) 13.0 - 17.0 g/dL   HCT 29.4 (*) 39.0 - 52.0 %   MCV 97.7  78.0 - 100.0 fL   MCH 31.6  26.0 - 34.0 pg   MCHC 32.3  30.0 - 36.0 g/dL   RDW 14.5  11.5 - 15.5 %   Platelets 132 (*) 150 - 400 K/uL   Neutrophils Relative % 55  43 - 77 %   Neutro Abs 2.9  1.7 - 7.7 K/uL   Lymphocytes Relative 29  12 - 46 %   Lymphs Abs 1.6  0.7 - 4.0 K/uL   Monocytes Relative 12  3 - 12 %   Monocytes Absolute 0.6  0.1 - 1.0 K/uL    Eosinophils Relative 4  0 - 5 %   Eosinophils Absolute 0.2  0.0 - 0.7 K/uL   Basophils Relative 0  0 - 1 %   Basophils Absolute 0.0  0.0 - 0.1 K/uL  COMPREHENSIVE METABOLIC PANEL     Status: Abnormal   Collection Time    06/06/13 10:37 PM      Result Value Range   Sodium 141  137 - 147 mEq/L   Potassium 5.4 (*) 3.7 - 5.3 mEq/L   Chloride 101  96 - 112 mEq/L   CO2 24  19 - 32 mEq/L   Glucose, Bld 81  70 - 99 mg/dL   BUN 44 (*) 6 - 23 mg/dL   Creatinine, Ser 12.78 (*) 0.50 - 1.35 mg/dL   Calcium 9.1  8.4 - 10.5 mg/dL   Total Protein 7.1  6.0 - 8.3 g/dL   Albumin 3.6  3.5 - 5.2 g/dL   AST 12  0 - 37 U/L   ALT 7  0 - 53 U/L   Alkaline Phosphatase 102  39 - 117 U/L   Total Bilirubin 0.3  0.3 - 1.2 mg/dL   GFR calc non Af Amer 4 (*) >90 mL/min   GFR calc Af Amer 4 (*) >90 mL/min   Comment: (NOTE)     The eGFR has been calculated using the CKD EPI equation.     This calculation has not been validated in all clinical situations.     eGFR's persistently <90 mL/min signify possible Chronic Kidney     Disease.  BASIC METABOLIC PANEL     Status: Abnormal   Collection Time    06/07/13  5:48 AM      Result Value Range   Sodium 140  137 - 147 mEq/L   Potassium 6.0 (*) 3.7 - 5.3 mEq/L   Chloride 103  96 - 112 mEq/L   CO2 21  19 - 32 mEq/L   Glucose, Bld 77  70 - 99 mg/dL   BUN 48 (*) 6 - 23 mg/dL   Creatinine, Ser 13.23 (*) 0.50 - 1.35 mg/dL   Calcium 8.7  8.4 - 10.5 mg/dL   GFR calc non Af Amer 4 (*) >90 mL/min   GFR calc Af Amer 4 (*) >90 mL/min   Comment: (NOTE)     The eGFR has been calculated using the CKD  EPI equation.     This calculation has not been validated in all clinical situations.     eGFR's persistently <90 mL/min signify possible Chronic Kidney     Disease.  CBC     Status: Abnormal   Collection Time    06/07/13  5:48 AM      Result Value Range   WBC 4.8  4.0 - 10.5 K/uL   RBC 2.76 (*) 4.22 - 5.81 MIL/uL   Hemoglobin 8.9 (*) 13.0 - 17.0 g/dL   HCT 27.4 (*) 39.0  - 52.0 %   MCV 99.3  78.0 - 100.0 fL   MCH 32.2  26.0 - 34.0 pg   MCHC 32.5  30.0 - 36.0 g/dL   RDW 14.8  11.5 - 15.5 %   Platelets 127 (*) 150 - 400 K/uL  HEPATITIS B SURFACE ANTIGEN     Status: None   Collection Time    06/07/13 10:59 AM      Result Value Range   Hepatitis B Surface Ag NEGATIVE  NEGATIVE   Comment: Performed at [facility]  CBC     Status: Abnormal   Collection Time    06/08/13  5:28 AM      Result Value Range   WBC 5.2  4.0 - 10.5 K/uL   RBC 3.00 (*) 4.22 - 5.81 MIL/uL   Hemoglobin 9.6 (*) 13.0 - 17.0 g/dL   HCT 28.8 (*) 39.0 - 52.0 %   MCV 96.0  78.0 - 100.0 fL   MCH 32.0  26.0 - 34.0 pg   MCHC 33.3  30.0 - 36.0 g/dL   RDW 14.5  11.5 - 15.5 %   Platelets 117 (*) 150 - 400 K/uL   Comment: PLATELET COUNT CONFIRMED BY SMEAR     REPEATED TO VERIFY  RENAL FUNCTION PANEL     Status: Abnormal   Collection Time    06/08/13  5:28 AM      Result Value Range   Sodium 137  137 - 147 mEq/L   Potassium 4.6  3.7 - 5.3 mEq/L   Comment: DELTA CHECK NOTED   Chloride 95 (*) 96 - 112 mEq/L   Comment: DELTA CHECK NOTED   CO2 29  19 - 32 mEq/L   Glucose, Bld 91  70 - 99 mg/dL   BUN 18  6 - 23 mg/dL   Comment: DELTA CHECK NOTED   Creatinine, Ser 7.86 (*) 0.50 - 1.35 mg/dL   Comment: DELTA CHECK NOTED   Calcium 9.3  8.4 - 10.5 mg/dL   Phosphorus 4.3  2.3 - 4.6 mg/dL   Albumin 3.5  3.5 - 5.2 g/dL   GFR calc non Af Amer 7 (*) >90 mL/min   GFR calc Af Amer 8 (*) >90 mL/min   Comment: (NOTE)     The eGFR has been calculated using the CKD EPI equation.     This calculation has not been validated in all clinical situations.     eGFR's persistently <90 mL/min signify possible Chronic Kidney     Disease.   Dg Chest 2 View  06/06/2013   CLINICAL DATA:  Nausea, vomiting and diarrhea.  EXAM: CHEST  2 VIEW  COMPARISON:  PA and lateral chest 05/10/2013.  FINDINGS: Lungs are clear. Heart size is mildly enlarged. No pneumothorax or pleural effusion. No focal bony  abnormality.  IMPRESSION: No acute disease.   Electronically Signed   By: Thomas  Dalessio M.D.   On: 06/06/2013 23:33      Assessment/Plan ESRD on HD TTS  RUE AVF, arm swelling x 2 days Fistulogram with possible intervention or HD placement. Patient has been NPO, labs reviewed, allergy to heparin. Risks and Benefits discussed with the patient. All of the patient's questions were answered, patient is agreeable to proceed. Consent signed and in chart.   MORGAN, KOREEN D PA-C 06/08/2013, 8:47 AM     

## 2013-06-08 NOTE — Procedures (Signed)
Post-Procedure Note  Pre-operative Diagnosis: ESRD with right upper extremity fistula and right arm swelling       Post-operative Diagnosis: Central venous occlusion.  Successfully treated with balloon angioplasty   Indications:  Right upper extremity swelling  Procedure Details:   Right upper extremity fistulogram performed.  Balloon angioplasty of right innominate and right subclavian stenoses.    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  Wire was successfully advanced beyond the stenosis.  Right subclavian angioplasty with 10 mm balloon.  Right innominate vein stenosis with 10 mm and 12 mm balloon.  Restored flow through right innominate vein and decreased filling of collaterals. SVC is patent.  Complications: None     Condition: Stable  Plan: Follow right upper extremity swelling.  If swelling does not improve, then we could consider placing a metallic stent.

## 2013-06-08 NOTE — Progress Notes (Signed)
[facility] PROGRESS NOTE  Logan Martin [id number] DOB: 1956-03-19 DOA: 06/06/2013 PCP: Lucrezia Starch, MD  Assessment/Plan: 1. Nausea vomiting and diarrhea. Resolved now. This was most likely due to to uremia, from missing his hemodialysis, who has him on when necessary Zofran, discussed with nephrology- plan for hemodialysis in the morning. Patient has mild hyperkalemia, but no EKG changes, he will be monitored on telemetry  2. End-stage renal disease. Continue hemodialysis on Tuesday Thursday and Saturday, can be discharged when his outpatient treatment can be arranged. Currently they are working on this for him.  3. Gastroesophageal reflux disease. Continue with PPI  4. History of coronary artery disease. Appears to be stable, no complaints of chest pain or shortness of breath.  5. Secondary hyperparathyroidism: of renal origin, continue with phoslo.  6. Anemia of chronic renal failure. Hemoglobin is stable  7. Hemorrhoids - will prescribe topical steroid preparation to use as needed   DVT Prophylaxis SCDs   HPI/Subjective: Pt without complaints today.  He says he is feeling much better.   Objective: Filed Vitals:   06/08/13 1005  BP: 169/79  Pulse: 81  Temp: 99.3 F (37.4 C)  Resp: 18    Intake/Output Summary (Last 24 hours) at 06/08/13 1356 Last data filed at 06/07/13 1422  Gross per 24 hour  Intake      0 ml  Output   3608 ml  Net  -3608 ml   Filed Weights   06/07/13 0201 06/07/13 1020 06/07/13 1422  Weight: 128 lb 3.2 oz (58.151 kg) 128 lb 4.9 oz (58.2 kg) 117 lb 15.1 oz (53.5 kg)    Exam: General:Alert, no apparent distress, chronically ill appearing  HEENT: MMM  Eyes: non icteric eomi  Neck: Right neck discomfort to palpitation with mild swelling  Heart: RRR 1/6 sem lsb , no rub  Lungs: bibasilar rales  Abdomen: bs nl , soft ,nt,nd  Extremities: No pedal edema, with Right upper extrem. swelling  Skin: No gross lesions  Neuro: no focal deficits    Data Reviewed: Basic Metabolic Panel:  Recent Labs Lab 06/06/13 2237 06/07/13 0548 06/08/13 0528  NA 141 140 137  K 5.4* 6.0* 4.6  CL 101 103 95*  CO2 24 21 29   GLUCOSE 81 77 91  BUN 44* 48* 18  CREATININE 12.78* 13.23* 7.86*  CALCIUM 9.1 8.7 9.3  PHOS  --   --  4.3   Liver Function Tests:  Recent Labs Lab 06/06/13 2237 06/08/13 0528  AST 12  --   ALT 7  --   ALKPHOS 102  --   BILITOT 0.3  --   PROT 7.1  --   ALBUMIN 3.6 3.5   No results found for this basename: LIPASE, AMYLASE,  in the last 168 hours No results found for this basename: AMMONIA,  in the last 168 hours CBC:  Recent Labs Lab 06/06/13 2237 06/07/13 0548 06/08/13 0528  WBC 5.4 4.8 5.2  NEUTROABS 2.9  --   --   HGB 9.5* 8.9* 9.6*  HCT 29.4* 27.4* 28.8*  MCV 97.7 99.3 96.0  PLT 132* 127* 117*   Cardiac Enzymes: No results found for this basename: CKTOTAL, CKMB, CKMBINDEX, TROPONINI,  in the last 168 hours BNP (last 3 results)  Recent Labs  05/10/13 0127  PROBNP 13135.0*   CBG: No results found for this basename: GLUCAP,  in the last 168 hours  No results found for this or any previous visit (from the past 240 hour(s)).  Studies: Dg Chest 2 View  06/06/2013   CLINICAL DATA:  Nausea, vomiting and diarrhea.  EXAM: CHEST  2 VIEW  COMPARISON:  PA and lateral chest 05/10/2013.  FINDINGS: Lungs are clear. Heart size is mildly enlarged. No pneumothorax or pleural effusion. No focal bony abnormality.  IMPRESSION: No acute disease.   Electronically Signed   By: Inge Rise M.D.   On: 06/06/2013 23:33   Ir Pta Venous Right  06/08/2013   CLINICAL DATA:  Right upper extremity fistula with right upper extremity swelling. Central venous stenosis was treated on 11/19/2011. Patient states that the right upper extremity swelling started approximately in 2 days ago.  EXAM: RIGHT UPPER EXTREMITY FISTULOGRAM; RIGHT CENTRAL VENOUS ANGIOPLASTY  Physician: Stephan Minister. Anselm Pancoast, MD  MEDICATIONS AND MEDICAL  HISTORY: Versed 1 mg, fentanyl 50 mcg. A radiology nurse monitored the patient for moderate sedation.  ANESTHESIA/SEDATION: Moderate sedation time: 45 minutes  FLUOROSCOPY TIME:  8 min and 24 seconds  PROCEDURE: The procedure was explained to the patient. The risks and benefits of the procedure were discussed and the patient's questions were addressed. Informed consent was obtained from the patient. The right upper extremity fistula was accessed and a series of fistulogram images were obtained. The right upper arm was prepped and draped in a sterile fashion. Maximal barrier sterile technique was utilized including caps, mask, sterile gowns, sterile gloves, sterile drape, hand hygiene and skin antiseptic. The skin was anesthetized with 1% lidocaine. A Bentson wire was advanced and the angiocath was exchanged for a 7 [place] vascular sheath. A 5 [place] Kumpe catheter was easily advanced beyond the central venous stenosis and into the SVC. A Rosen wire was placed in the inferior vena cava. Follow-up central venogram was performed. The areas of central venous stenosis in the right innominate vein and right subclavian vein were angioplastied with a 10 mm x 40 mm balloon. Follow venogram was performed. The right innominate vein was angioplastied again with a 12 mm x 40 mm balloon. Follow up venogram performed. The right innominate vein and right subclavian were treated a final time with a 10 mm x 40 mm balloon. Follow venogram images were obtained. Vascular sheath was removed with a pursestring suture.  FINDINGS: Patent right upper extremity fistula. The initial images demonstrated near complete occlusion of the right central venous system with large collateral vessels in the right neck. Wire was easily advanced into the SVC. Following balloon angioplasty, there is improved flow through the right innominate vein. The final venogram demonstrated preferential flow in the right innominate vein and decreased filling of the  collateral vessels in the right neck. SVC is patent and the arterial anastomosis is patent.  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 The right innominate vein and right subclavian vein were successfully treated with balloon angioplasty. Improved flow through the right central venous system at the end of the procedure. Patient may need placement of a stent if the right upper extremity swelling does not improve with angioplasty alone.   Electronically Signed   By: Markus Daft M.D.   On: 06/08/2013 11:30    Scheduled Meds: . amLODipine  10 mg Oral Daily  . calcium acetate  667 mg Oral TID WC  . [START ON 06/16/2013] darbepoetin (ARANESP) injection - DIALYSIS  60 mcg Intravenous Q Tue-HD  . fentaNYL      . [START ON 06/09/2013] ferric gluconate (FERRLECIT/NULECIT) IV  62.5 mg Intravenous Q Tue-HD  . midazolam      .  multivitamin  1 tablet Oral QHS  . pantoprazole  40 mg Oral Daily  . sodium chloride  3 mL Intravenous Q12H   Continuous Infusions:   Principal Problem:   Nausea and vomiting Active Problems:   CORONARY ARTERY DISEASE   GERD   Secondary hyperparathyroidism (of renal origin)   Anemia associated with chronic renal failure   ESRD (end stage renal disease)   [facility] Pager [phone]. If 7PM-7AM, please contact night-coverage at [email or web address], password [facility] 06/08/2013, 1:56 PM  LOS: 2 days

## 2013-06-08 NOTE — Progress Notes (Addendum)
[facility] Progress Note  Subjective:   Feels good. Trying to sleep after IR procedure. No N and V  Objective Filed Vitals:   06/08/13 0930 06/08/13 0935 06/08/13 0939 06/08/13 0945  BP: 168/78 157/73 167/67 166/67  Pulse: 90 80 81 79  Temp:      TempSrc:      Resp: 17 14 13 13   Height:      Weight:      SpO2: 95% 100% 100% 100%   Physical Exam General: rouses easily NAD Heart: RRR Lungs: clear without wheezes or rales Abdomen: soft  Extremities: no LE edema Dialysis Access: right upper AVF patent  Dialysis Orders: Center: [place] on [place] . - last at [facility] prior to moving to [facility]. EDW 54.5 HD [place] ? Time 4 hrs Heparin 0 RELIGIOUS reason due to pork; not true allergy  Access R UA AVF BFR ? DFR ?  Zemplar ? mcg IV/HD Epogen 10,000 Units IV/HD Venofer 50mg  weekly   Assessment/Plan: 1. Hyperkalemia= sec to missed and also central stenoses HD yesterday K down to K 4.6 today 2. ESRD - Normal schedule TTS / previously at [facility]. CLIP process being initiated; plan HD in am 3. Hypertension/volume - sys BP 160, amlodipine 10mg  hs and hd uf to 3.6 to 53.5 yesterday - challenge volume as able; EDW 3 kg from his July 2014 admission just prior to his moving to [place] 4. R Upper ext . Swelling - f'gram this am pt had PTA of complete occlusion of right innominate at area of prev stenosis and also PTA right subclavian stenosis with decreased filling of collaterals - will need baseline AF at his new outpt center 5. Anemia - hgb 9.6 start ESA next HD (scheduled for 1/17) / he reports weekly Iron - check Fe studies 6. Metabolic bone disease - Phoslo binder / on vit d check with paper work for center for vit d  7. R Neck discomfort= per admit wu 8. Chronic Hep C 9. Remote history of PE (on coumadin for a year; since discontinued) 10. Disp - d/c when outpt schedule is arranged  Myriam Jacobson, PA-C [place] [phone] 06/08/2013,10:05 AM  LOS: 2 days    I have seen and examined patient, discussed with PA and agree with assessment and plan as outlined above. Kelly Splinter MD pager [phone]    cell [phone] 06/08/2013, 12:29 PM    Additional Objective Labs: Basic Metabolic Panel:  Recent Labs Lab 06/06/13 2237 06/07/13 0548 06/08/13 0528  NA 141 140 137  K 5.4* 6.0* 4.6  CL 101 103 95*  CO2 24 21 29   GLUCOSE 81 77 91  BUN 44* 48* 18  CREATININE 12.78* 13.23* 7.86*  CALCIUM 9.1 8.7 9.3  PHOS  --   --  4.3   Liver Function Tests:  Recent Labs Lab 06/06/13 2237 06/08/13 0528  AST 12  --   ALT 7  --   ALKPHOS 102  --   BILITOT 0.3  --   PROT 7.1  --   ALBUMIN 3.6 3.5   CBC:  Recent Labs Lab 06/06/13 2237 06/07/13 0548 06/08/13 0528  WBC 5.4 4.8 5.2  NEUTROABS 2.9  --   --   HGB 9.5* 8.9* 9.6*  HCT 29.4* 27.4* 28.8*  MCV 97.7 99.3 96.0  PLT 132* 127* 117*  Studies/Results: Dg Chest 2 View  06/06/2013   CLINICAL DATA:  Nausea, vomiting and diarrhea.  EXAM: CHEST  2 VIEW  COMPARISON:  PA and lateral chest 05/10/2013.  FINDINGS: Lungs are clear. Heart size is mildly enlarged. No pneumothorax or pleural effusion. No focal bony abnormality.  IMPRESSION: No acute disease.   Electronically Signed   By: Inge Rise M.D.   On: 06/06/2013 23:33   Medications:   . amLODipine  10 mg Oral Daily  . calcium acetate  667 mg Oral TID WC  . [START ON 06/13/2013] darbepoetin (ARANESP) injection - DIALYSIS  60 mcg Intravenous Q Sat-HD  . fentaNYL      . midazolam      . multivitamin  1 tablet Oral QHS  . pantoprazole  40 mg Oral Daily  . sodium chloride  3 mL Intravenous Q12H

## 2013-06-09 DIAGNOSIS — R0789 Other chest pain: Secondary | ICD-10-CM

## 2013-06-09 LAB — RENAL FUNCTION PANEL
Albumin: 3.3 g/dL — ABNORMAL LOW (ref 3.5–5.2)
BUN: 26 mg/dL — ABNORMAL HIGH (ref 6–23)
CO2: 27 mEq/L (ref 19–32)
Calcium: 9 mg/dL (ref 8.4–10.5)
Chloride: 96 mEq/L (ref 96–112)
Creatinine, Ser: 10.62 mg/dL — ABNORMAL HIGH (ref 0.50–1.35)
GFR calc Af Amer: 5 mL/min — ABNORMAL LOW (ref >90)
GFR calc non Af Amer: 5 mL/min — ABNORMAL LOW (ref >90)
Glucose, Bld: 83 mg/dL (ref 70–99)
Phosphorus: 5 mg/dL — ABNORMAL HIGH (ref 2.3–4.6)
Potassium: 4.9 mEq/L (ref 3.7–5.3)
Sodium: 137 mEq/L (ref 137–147)

## 2013-06-09 LAB — CBC
HCT: 28 % — ABNORMAL LOW (ref 39.0–52.0)
Hemoglobin: 9.6 g/dL — ABNORMAL LOW (ref 13.0–17.0)
MCH: 32.3 pg (ref 26.0–34.0)
MCHC: 34.3 g/dL (ref 30.0–36.0)
MCV: 94.3 fL (ref 78.0–100.0)
Platelets: 107 10*3/uL — ABNORMAL LOW (ref 150–400)
RBC: 2.97 MIL/uL — ABNORMAL LOW (ref 4.22–5.81)
RDW: 14.1 % (ref 11.5–15.5)
WBC: 4.6 10*3/uL (ref 4.0–10.5)

## 2013-06-09 MED ORDER — SODIUM CHLORIDE 0.9 % IV SOLN: 100.0000 mL | INTRAVENOUS | Status: DC | PRN

## 2013-06-09 MED ORDER — LIDOCAINE-PRILOCAINE 2.5-2.5 % EX CREA
1.0000 "application " | TOPICAL_CREAM | CUTANEOUS | Status: DC | PRN
  Filled 2013-06-09: qty 5

## 2013-06-09 MED ORDER — RENA-VITE PO TABS: 1.0000 | ORAL_TABLET | Freq: Every day | ORAL | Status: DC

## 2013-06-09 MED ORDER — DARBEPOETIN ALFA-POLYSORBATE 60 MCG/0.3ML IJ SOLN
INTRAMUSCULAR | Status: AC
  Administered 2013-06-09: 60 ug
  Filled 2013-06-09: qty 0.3

## 2013-06-09 MED ORDER — LIDOCAINE HCL (PF) 1 % IJ SOLN
5.0000 mL | INTRAMUSCULAR | Status: DC | PRN
Start: 2013-06-09 — End: 2013-06-09

## 2013-06-09 MED ORDER — PENTAFLUOROPROP-TETRAFLUOROETH EX AERO: 1.0000 "application " | INHALATION_SPRAY | CUTANEOUS | Status: DC | PRN

## 2013-06-09 MED ORDER — DARBEPOETIN ALFA-POLYSORBATE 60 MCG/0.3ML IJ SOLN: 60.0000 ug | INTRAMUSCULAR | Status: DC

## 2013-06-09 MED ORDER — NEPRO/CARBSTEADY PO LIQD: 237.0000 mL | ORAL | Status: DC | PRN

## 2013-06-09 MED ORDER — ALTEPLASE 2 MG IJ SOLR
2.0000 mg | Freq: Once | INTRAMUSCULAR | Status: DC | PRN
  Filled 2013-06-09: qty 2

## 2013-06-09 NOTE — Discharge Instructions (Signed)
End-Stage Kidney Disease The kidneys are two organs that lie on either side of the spine between the middle of the back and the front of the abdomen. The kidneys:   Remove wastes and extra water from the blood.   Produce important hormones. These help keep bones strong, regulate blood pressure, and help create red blood cells.   Balance the fluids and chemicals in the blood and tissues. End-stage kidney disease occurs when the kidneys are so damaged that they cannot do their job. When the kidneys cannot do their job, life-threatening problems occur. The body cannot stay clean and strong without the help of the kidneys. In end-stage kidney disease, the kidneys cannot get better.You need a new kidney or treatments to do some of the work healthy kidneys do in order to stay alive. CAUSES  End-stage kidney disease usually occurs when a long-lasting (chronic) kidney disease gets worse. It may also occur after the kidneys are suddenly damaged (acute kidney injury).  SYMPTOMS   Swelling (edema) of the legs, ankles, or feet.   Tiredness (lethargy).   Nausea or vomiting.   Confusion.   Problems with urination, such as:   Decreased urine production.   Frequent urination, especially at night.   Frequent accidents in children who are potty trained.   Muscle twitches and cramps.   Persistent itchiness.   Loss of appetite.   Headaches.   Abnormally dark or light skin.   Numbness in the hands or feet.   Easy bruising.   Frequent hiccups.   Menstruation stops. DIAGNOSIS  Your caregiver will measure your blood pressure and take some tests. These may include:   Urine tests.   Blood tests.   Imaging tests, such as:   An ultrasound exam.   Computed tomography (CT).  A kidney biopsy. TREATMENT  There are two treatments for end-stage kidney disease:   A procedure that removes toxic wastes from the body (dialysis).   Receiving a new kidney (kidney  transplant). Both of these treatments have serious risks and consequences. Your caregiver will help you determine which treatment is best for you based on your health, age, and other factors. In addition to having dialysis or a kidney transplant, you may need to take medicines to control high blood pressure (hypertension) and cholesterol and to decrease phosphorus levels in your blood.  HOME CARE INSTRUCTIONS  Follow your prescribed diet.   Only take over-the-counter or prescription medicines as directed by your caregiver.   Do not take any new medicines (prescription, over-the-counter, or nutritional supplements) unless approved by your caregiver. Many medicines can worsen your kidney damage or need to have the dose adjusted.   Keep all follow-up appointments. MAKE SURE YOU:  Understand these instructions.  Will watch your condition.  Will get help right away if you are not doing well or get worse Document Released: 08/04/2003 Document Revised: 04/30/2012 Document Reviewed: 01/11/2012 [facility] Patient Information 2014 [employer].

## 2013-06-09 NOTE — Discharge Summary (Signed)
Physician Discharge Summary  GERBER SOLANKI [id number] DOB: 1956/03/31 DOA: 06/06/2013  PCP: Lucrezia Starch, MD  Admit date: 06/06/2013 Discharge date: 06/09/2013  Recommendations for Outpatient Follow-up:  1. Resume outpatient HD treatments as arranged  Discharge Diagnoses:  Principal Problem:   Nausea and vomiting Active Problems:   CORONARY ARTERY DISEASE   GERD   Secondary hyperparathyroidism (of renal origin)   Anemia associated with chronic renal failure   ESRD (end stage renal disease)  Discharge Condition: stable   Diet recommendation: renal   Filed Weights   06/07/13 1020 06/07/13 1422 06/09/13 0733  Weight: 128 lb 4.9 oz (58.2 kg) 117 lb 15.1 oz (53.5 kg) 110 lb 10.7 oz (50.2 kg)    History of present illness:  Logan Martin is a 58 y.o. male, he presents with complaints of nausea vomiting and diarrhea, patient reports symptoms started over the last 24 hours, patient denies any fever, any chills, any sick contacts, any coffee-ground emesis, any hematochezia, denies eating outside, the patient reports he missed his hemodialysis for Thursday and Saturday, reports he received his hemodialysis on Tuesday in [place], reports he is currently permanently move to [place], reportedly for the social worker in [place] that she set up his dialysis here, but apparently it was not read yet, patient has been mostly between [place] over the last few month, patient denies any chest pain, and denies any shortness of breath, his potassium was 5.4, his EKG did not show any acute changes, denies any edema or orthopnea.   Hospital Course:   1. Nausea vomiting and diarrhea. Resolved now. This was most likely due to to uremia, from missing his hemodialysis, who has him on when necessary Zofran, discussed with nephrology- Pt was given hemodialysis and symptoms resolved.  2. End-stage renal disease. Continue hemodialysis on Tuesday Thursday and Saturday, can be discharged when  his outpatient treatment arranged.  3. Gastroesophageal reflux disease.  4. History of coronary artery disease. Appears to be stable, no complaints of chest pain or shortness of breath.  5. Secondary hyperparathyroidism: of renal origin, continue with phoslo.  6. Anemia of chronic renal failure. Hemoglobin is stable  7. Hemorrhoids - topical steroid preparation to use as needed  Nephrology Team Report 1. Hyperkalemia= sec to missed and also central stenoses HD yesterday K down prior to discharge 2. ESRD - Normal schedule TTS / previously at [facility]. CLIP process initiated; on HD labs pending; I think he can be d/c today with anticipation that he will be approved for admission into [facility] - can given him a tentative start time for Thursday. 3. Hypertension/volume - sys BP ^, amlodipine 10mg  hs and hd uf to 3.6 to 53.5 yesterday - challenge volume as able - via crit line; EDW 3 kg from his July 2014 admission just prior to his moving to [place]; need to review records from [facility]; will have a new EDW at d/c 4. R Upper ext . Swelling - f'gram 1/12 s/p PTA of complete occlusion of right innominate at area of prev stenosis and also PTA right subclavian stenosis with decreased filling of collaterals - will need baseline AF x 2 at his new outpt center 5. Anemia - hgb 9.6 On ESA he reports weekly iron - awaiting records from [facility] 6. Metabolic bone disease - Phoslo binder / awaiting info from [place] 7. Chronic Hep C 8. Remote history of PE (on coumadin for a year; since discontinued) 9. Nutrition - losing weight - he attributes to vegan diet -  will had outpt RD address to make sure he gets adequate kcal 10. Disp - ok for d/c today after start time arranged at his center; will will call to confirm after d/c; his cell is [phone]  Procedures: 11.  f'gram 1/12 s/p PTA of complete occlusion of right innominate at area of prev stenosis and also PTA right subclavian stenosis with decreased filling of collaterals - will need  baseline AF x 2 at his new outpt center  Consultations:  nephrology  Discharge Exam:Pt without complaints, seen in HD, asking to go home after HD today, nephro team has arranged outpatient treatment  Filed Vitals:   06/09/13 0900  BP: 180/86  Pulse: 91  Temp:   Resp: 15   General:Alert, no apparent distress, chronically ill appearing  HEENT: MMM  Eyes: non icteric eomi  Neck: Right neck discomfort to palpitation with mild swelling  Heart: RRR 1/6 sem lsb , no rub  Lungs: bibasilar rales  Abdomen: bs nl , soft ,nt,nd  Extremities: No pedal edema,  Skin: No gross lesions  Neuro: no focal deficits   Discharge Instructions  Discharge Orders   Future Orders Complete By Expires   Discharge instructions  As directed    Comments:     Return if symptoms recur, worsen or new problems develop.  Start hemodialysis at your new outpatient dialysis center   Increase activity slowly  As directed        Medication List         amLODipine 10 MG tablet  Commonly known as:  NORVASC  Take 10 mg by mouth daily.     calcium acetate 667 MG capsule  Commonly known as:  PHOSLO  Take 667 mg by mouth 5 (five) times daily. Takes 1 cap with 3 meals, 1 cap with 2 snacks     multivitamin Tabs tablet  Take 1 tablet by mouth at bedtime.     omeprazole 20 MG capsule  Commonly known as:  PRILOSEC  Take 20 mg by mouth daily.     oxycodone 30 MG immediate release tablet  Commonly known as:  ROXICODONE  Take 30 mg by mouth every 8 (eight) hours as needed for pain.       Allergies  Allergen Reactions  . Heparin Rash    Pork products   . Pork-Derived Products Rash  . Sulfa Antibiotics Rash  . Sulfonamide Derivatives Rash       Follow-up Information   Follow up with DUNHAM,CYNTHIA B, MD In 2 days. (outpatient hemodialysis as arranged)    Specialty:  Nephrology   Contact information:   [place] [address] [phone]       Follow up  with [facility] . Schedule an appointment as soon as possible for a visit in 1 week. (hospital followup )        The results of significant diagnostics from this hospitalization (including imaging, microbiology, ancillary and laboratory) are listed below for reference.    Significant Diagnostic Studies: Dg Chest 2 View  06/06/2013   CLINICAL DATA:  Nausea, vomiting and diarrhea.  EXAM: CHEST  2 VIEW  COMPARISON:  PA and lateral chest 05/10/2013.  FINDINGS: Lungs are clear. Heart size is mildly enlarged. No pneumothorax or pleural effusion. No focal bony abnormality.  IMPRESSION: No acute disease.   Electronically Signed   By: Inge Rise M.D.   On: 06/06/2013 23:33   Ir Pta Venous Right  06/08/2013   CLINICAL  DATA:  Right upper extremity fistula with right upper extremity swelling. Central venous stenosis was treated on 11/19/2011. Patient states that the right upper extremity swelling started approximately in 2 days ago.  EXAM: RIGHT UPPER EXTREMITY FISTULOGRAM; RIGHT CENTRAL VENOUS ANGIOPLASTY  Physician: Stephan Minister. Anselm Pancoast, MD  MEDICATIONS AND MEDICAL HISTORY: Versed 1 mg, fentanyl 50 mcg. A radiology nurse monitored the patient for moderate sedation.  ANESTHESIA/SEDATION: Moderate sedation time: 45 minutes  FLUOROSCOPY TIME:  8 min and 24 seconds  PROCEDURE: The procedure was explained to the patient. The risks and benefits of the procedure were discussed and the patient's questions were addressed. Informed consent was obtained from the patient. The right upper extremity fistula was accessed and a series of fistulogram images were obtained. The right upper arm was prepped and draped in a sterile fashion. Maximal barrier sterile technique was utilized including caps, mask, sterile gowns, sterile gloves, sterile drape, hand hygiene and skin antiseptic. The skin was anesthetized with 1% lidocaine. A Bentson wire was advanced and the angiocath was exchanged for a 7 [place] vascular sheath. A  5 [place] Kumpe catheter was easily advanced beyond the central venous stenosis and into the SVC. A Rosen wire was placed in the inferior vena cava. Follow-up central venogram was performed. The areas of central venous stenosis in the right innominate vein and right subclavian vein were angioplastied with a 10 mm x 40 mm balloon. Follow venogram was performed. The right innominate vein was angioplastied again with a 12 mm x 40 mm balloon. Follow up venogram performed. The right innominate vein and right subclavian were treated a final time with a 10 mm x 40 mm balloon. Follow venogram images were obtained. Vascular sheath was removed with a pursestring suture.  FINDINGS: Patent right upper extremity fistula. The initial images demonstrated near complete occlusion of the right central venous system with large collateral vessels in the right neck. Wire was easily advanced into the SVC. Following balloon angioplasty, there is improved flow through the right innominate vein. The final venogram demonstrated preferential flow in the right innominate vein and decreased filling of the collateral vessels in the right neck. SVC is patent and the arterial anastomosis is patent.  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 The right innominate vein and right subclavian vein were successfully treated with balloon angioplasty. Improved flow through the right central venous system at the end of the procedure. Patient may need placement of a stent if the right upper extremity swelling does not improve with angioplasty alone.   Electronically Signed   By: Markus Daft M.D.   On: 06/08/2013 11:30   Ir Shuntogram/ Fistulagram Right Mod Sed  06/08/2013   CLINICAL DATA:  Right upper extremity fistula with right upper extremity swelling. Central venous stenosis was treated on 11/19/2011. Patient states that the right upper extremity swelling started approximately in 2 days ago.  EXAM: RIGHT UPPER  EXTREMITY FISTULOGRAM; RIGHT CENTRAL VENOUS ANGIOPLASTY  Physician: Stephan Minister. Anselm Pancoast, MD  MEDICATIONS AND MEDICAL HISTORY: Versed 1 mg, fentanyl 50 mcg. A radiology nurse monitored the patient for moderate sedation.  ANESTHESIA/SEDATION: Moderate sedation time: 45 minutes  FLUOROSCOPY TIME:  8 min and 24 seconds  PROCEDURE: The procedure was explained to the patient. The risks and benefits of the procedure were discussed and the patient's questions were addressed. Informed consent was obtained from the patient. The right upper extremity fistula was accessed and a series of fistulogram images were obtained. The right upper arm  was prepped and draped in a sterile fashion. Maximal barrier sterile technique was utilized including caps, mask, sterile gowns, sterile gloves, sterile drape, hand hygiene and skin antiseptic. The skin was anesthetized with 1% lidocaine. A Bentson wire was advanced and the angiocath was exchanged for a 7 [place] vascular sheath. A 5 [place] Kumpe catheter was easily advanced beyond the central venous stenosis and into the SVC. A Rosen wire was placed in the inferior vena cava. Follow-up central venogram was performed. The areas of central venous stenosis in the right innominate vein and right subclavian vein were angioplastied with a 10 mm x 40 mm balloon. Follow venogram was performed. The right innominate vein was angioplastied again with a 12 mm x 40 mm balloon. Follow up venogram performed. The right innominate vein and right subclavian were treated a final time with a 10 mm x 40 mm balloon. Follow venogram images were obtained. Vascular sheath was removed with a pursestring suture.  FINDINGS: Patent right upper extremity fistula. The initial images demonstrated near complete occlusion of the right central venous system with large collateral vessels in the right neck. Wire was easily advanced into the SVC. Following balloon angioplasty, there is improved flow through the right innominate vein.  The final venogram demonstrated preferential flow in the right innominate vein and decreased filling of the collateral vessels in the right neck. SVC is patent and the arterial anastomosis is patent.  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 The right innominate vein and right subclavian vein were successfully treated with balloon angioplasty. Improved flow through the right central venous system at the end of the procedure. Patient may need placement of a stent if the right upper extremity swelling does not improve with angioplasty alone.   Electronically Signed   By: Markus Daft M.D.   On: 06/08/2013 11:30    Microbiology: No results found for this or any previous visit (from the past 240 hour(s)).   Labs: Basic Metabolic Panel:  Recent Labs Lab 06/06/13 2237 06/07/13 0548 06/08/13 0528 06/09/13 0757  NA 141 140 137 137  K 5.4* 6.0* 4.6 4.9  CL 101 103 95* 96  CO2 24 21 29 27   GLUCOSE 81 77 91 83  BUN 44* 48* 18 26*  CREATININE 12.78* 13.23* 7.86* 10.62*  CALCIUM 9.1 8.7 9.3 9.0  PHOS  --   --  4.3 5.0*   Liver Function Tests:  Recent Labs Lab 06/06/13 2237 06/08/13 0528 06/09/13 0757  AST 12  --   --   ALT 7  --   --   ALKPHOS 102  --   --   BILITOT 0.3  --   --   PROT 7.1  --   --   ALBUMIN 3.6 3.5 3.3*   No results found for this basename: LIPASE, AMYLASE,  in the last 168 hours No results found for this basename: AMMONIA,  in the last 168 hours CBC:  Recent Labs Lab 06/06/13 2237 06/07/13 0548 06/08/13 0528 06/09/13 0757  WBC 5.4 4.8 5.2 4.6  NEUTROABS 2.9  --   --   --   HGB 9.5* 8.9* 9.6* 9.6*  HCT 29.4* 27.4* 28.8* 28.0*  MCV 97.7 99.3 96.0 94.3  PLT 132* 127* 117* 107*   Cardiac Enzymes: No results found for this basename: CKTOTAL, CKMB, CKMBINDEX, TROPONINI,  in the last 168 hours BNP: BNP (last 3 results)  Recent Labs  05/10/13 0127  PROBNP 13135.0*   CBG: No results  found for this basename: GLUCAP,   in the last 168 hours  Signed:  [facility] 06/09/2013, 9:30 AM

## 2013-06-09 NOTE — Progress Notes (Signed)
[facility] Progress Note  Subjective:   Hopes to go home today; feels well.  Objective Filed Vitals:   06/08/13 1808 06/08/13 1946 06/08/13 2305 06/09/13 0512  BP: 168/87  160/70 163/84  Pulse: 90  81 93  Temp: 100.8 F (38.2 C) 98.8 F (37.1 C) 99.1 F (37.3 C) 99.2 F (37.3 C)  TempSrc: Oral Oral Oral Oral  Resp: 20  18 18   Height:      Weight:      SpO2: 96%  98% 98%   Physical Exam pre HD weight 50.1 standing General: NAD, thin Heart: RRR Lungs: no wheezes or rales Abdomen: soft NT Extremities: no edema Dialysis Access: right upper AVF Qb 400  Dialysis Orders: Center: [place] . - last at [facility] prior to moving to [facility].  EDW 54.5 HD [place] ? Time 4 hrs Heparin 0 RELIGIOUS reason due to pork; not true allergy  Access R UA AVF BFR ? DFR  IV/HD Epogen 10,000 Units IV/HD Venofer 50mg  weekly  (Had pt sign release form to get records from [facility] 1/13)  Assessment/Plan:  1. Hyperkalemia= sec to missed and also central stenoses HD yesterday K down to K 4.6 Monday - labs today pending 2. ESRD - Normal schedule TTS / previously at [facility]. CLIP process initiated; on HD labs pending; I think he can be d/c today with anticipation that he will be approved for admission into [place] - can given him a tentative start time for Thursday. 3. Hypertension/volume - sys BP ^, amlodipine 10mg  hs and hd uf to 3.6 to 53.5 yesterday - challenge volume as able - via crit line; EDW 3 kg from his July 2014 admission just prior to his moving to [place]; need to review records from [facility]; will have a new EDW at d/c 4. R Upper ext . Swelling - f'gram 1/12 s/p PTA of complete occlusion of right innominate at area of prev stenosis and also PTA right subclavian stenosis with decreased filling of collaterals - will need baseline AF x 2 at his new outpt center 5. Anemia - hgb 9.6  On ESA he reports weekly iron - awaiting records from [facility] 6. Metabolic bone disease - Phoslo binder / awaiting info from  [place] 7. R Neck discomfort= per admit wu 8. Chronic Hep C 9. Remote history of PE (on coumadin for a year; since discontinued) 10. Nutrition - losing weight - he attributes to vegan diet - will had outpt RD address to make sure he gets adequate kcal 11. Disp - ok for d/c today after start time arranged at his center; will will call to confirm after d/c; his cell is [place], PA-C [place] [phone] 06/09/2013,7:51 AM  LOS: 3 days   I have seen and examined patient, discussed with PA and agree with assessment and plan as outlined above. Kelly Splinter MD pager [phone]    cell [phone] 06/09/2013, 12:18 PM    Additional Objective Labs: Basic Metabolic Panel:  Recent Labs Lab 06/06/13 2237 06/07/13 0548 06/08/13 0528  NA 141 140 137  K 5.4* 6.0* 4.6  CL 101 103 95*  CO2 24 21 29   GLUCOSE 81 77 91  BUN 44* 48* 18  CREATININE 12.78* 13.23* 7.86*  CALCIUM 9.1 8.7 9.3  PHOS  --   --  4.3   Liver Function Tests:  Recent Labs Lab 06/06/13 2237 06/08/13 0528  AST 12  --   ALT 7  --  ALKPHOS 102  --   BILITOT 0.3  --   PROT 7.1  --   ALBUMIN 3.6 3.5   CBC:  Recent Labs Lab 06/06/13 2237 06/07/13 0548 06/08/13 0528  WBC 5.4 4.8 5.2  NEUTROABS 2.9  --   --   HGB 9.5* 8.9* 9.6*  HCT 29.4* 27.4* 28.8*  MCV 97.7 99.3 96.0  PLT 132* 127* 117*  Medications:   . amLODipine  10 mg Oral Daily  . calcium acetate  667 mg Oral TID WC  . [START ON 06/16/2013] darbepoetin (ARANESP) injection - DIALYSIS  60 mcg Intravenous Q Tue-HD  . ferric gluconate (FERRLECIT/NULECIT) IV  62.5 mg Intravenous Q Tue-HD  . multivitamin  1 tablet Oral QHS  . pantoprazole  40 mg Oral Daily  . sodium chloride  3 mL Intravenous Q12H

## 2013-06-30 ENCOUNTER — Other Ambulatory Visit: Payer: Self-pay

## 2013-06-30 ENCOUNTER — Encounter: Payer: Self-pay | Admitting: Surgery

## 2013-06-30 DIAGNOSIS — T82598A Other mechanical complication of other cardiac and vascular devices and implants, initial encounter: Secondary | ICD-10-CM

## 2013-07-08 ENCOUNTER — Ambulatory Visit (INDEPENDENT_AMBULATORY_CARE_PROVIDER_SITE_OTHER): Payer: Medicare Other | Admitting: General Surgery

## 2013-07-10 ENCOUNTER — Encounter: Payer: Self-pay | Admitting: Surgery

## 2013-07-13 ENCOUNTER — Inpatient Hospital Stay (HOSPITAL_COMMUNITY): Admission: RE | Admit: 2013-07-13 | Payer: Medicare Other | Source: Ambulatory Visit

## 2013-07-13 ENCOUNTER — Ambulatory Visit: Payer: Medicare Other | Admitting: Surgery

## 2013-08-07 ENCOUNTER — Encounter: Payer: Self-pay | Admitting: Surgery

## 2013-08-10 ENCOUNTER — Encounter: Payer: Self-pay | Admitting: Surgery

## 2013-08-10 ENCOUNTER — Ambulatory Visit (INDEPENDENT_AMBULATORY_CARE_PROVIDER_SITE_OTHER): Payer: Medicare Other | Admitting: Surgery

## 2013-08-10 ENCOUNTER — Ambulatory Visit (HOSPITAL_COMMUNITY)
Admission: RE | Admit: 2013-08-10 | Discharge: 2013-08-10 | Disposition: A | Discharge status: Home / Self Care | Payer: Medicare Other | Source: Ambulatory Visit | Attending: Surgery | Admitting: Surgery

## 2013-08-10 VITALS — BP 144/69 | HR 77 | Ht 64.0 in | Wt 123.0 lb

## 2013-08-10 DIAGNOSIS — T82598A Other mechanical complication of other cardiac and vascular devices and implants, initial encounter: Secondary | ICD-10-CM

## 2013-08-10 DIAGNOSIS — N186 End stage renal disease: Secondary | ICD-10-CM | POA: Insufficient documentation

## 2013-08-10 DIAGNOSIS — T82898A Other specified complication of vascular prosthetic devices, implants and grafts, initial encounter: Secondary | ICD-10-CM | POA: Insufficient documentation

## 2013-08-10 DIAGNOSIS — Y838 Other surgical procedures as the cause of abnormal reaction of the patient, or of later complication, without mention of misadventure at the time of the procedure: Secondary | ICD-10-CM | POA: Insufficient documentation

## 2013-08-10 NOTE — Progress Notes (Signed)
Patient name: Logan Martin MRN: [id number] DOB: [birth year] Sex: male   Referred by: Dr. Lorrene Reid  Reason for referral:  Chief Complaint  Patient presents with  . 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  The patient states that his fistula began getting larger about a year ago.  He does state that recently it has not enlarged in size.  He has a history of fistula and graft placement in [place].  He was recently seen at [facility] for a fistulogram.  Central venous stenosis was dilated.  The patient does not report any issues with cannulation of his fistula.  He does not report any issues of bleeding  The patient has a history of diabetes without complication.  He is medically managed for hypertension.  He is a former smoker.  Past Medical History  Diagnosis Date  . Hx of bladder repair surgery   . Hepatitis C   . Cocaine abuse in remission     Quit date in 1995  . Alcohol abuse     quit date in 1995  . 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 Started HD in [place], [place].  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  On NxStage home hemo since 2010. Using RUA AVF.    [name] Hypertension   . Pulmonary embolism Feb 2012    Treated with coumadin x 1 year  . Renal insufficiency   . Diabetes mellitus without complication   . Anemia   . Peripheral vascular disease     Past Surgical History  Procedure Laterality Date  . Hernia repair    . Bladder surgery    . Av fistula placement    . Coronary artery stent  1999    History   Social History  . Marital Status: Single    Spouse Name: N/A    Number of Children: N/A  . Years of Education: N/A   Occupational History  . Not on file.   Social History Main Topics  . Smoking status: Former Smoker    Quit date: 05/28/1993  . Smokeless tobacco:  Never Used  . Alcohol Use: No  . Drug Use: No     Comment: History of cocaine use  . Sexual Activity: Not on file   Other Topics Concern  . Not on file   Social History Narrative  . No narrative on file    Family History  Problem Relation Age of Onset  . Cancer Mother   . Diabetes Mother   . Hypertension Mother   . Deep vein thrombosis Sister   . Deep vein thrombosis Daughter     Allergies as of 08/10/2013 - Review Complete 08/10/2013  Allergen Reaction Noted  . Heparin Rash 11/19/2011  . Pork-derived products Rash 08/04/2012  . Sulfa antibiotics Rash 11/22/2011  . Sulfonamide derivatives Rash     Current Outpatient Prescriptions on File Prior to Visit  Medication Sig Dispense Refill  . amLODipine (NORVASC) 10 MG tablet Take 10 mg by mouth daily.      . calcium acetate (PHOSLO) 667 MG capsule Take 667 mg by mouth 5 (five) times daily. Takes 1 cap with 3 meals, 1 cap with 2 snacks      . multivitamin (RENA-VIT) TABS tablet Take 1 tablet by mouth at bedtime.  30 tablet  0  . omeprazole (PRILOSEC) 20 MG capsule Take 20 mg by mouth daily.      [name] oxycodone (ROXICODONE) 30 MG immediate release tablet Take 30 mg by mouth every 8 (eight) hours as needed for pain.        No current facility-administered medications on file prior to visit.     REVIEW OF SYSTEMS: Please see history of present illness, otherwise all systems are negative  PHYSICAL EXAMINATION: General: The patient appears their stated age.  Vital signs are BP 144/69  Pulse 77  Ht 5\' 4"  (1.626 m)  Wt 123 lb (55.792 kg)  BMI 21.10 kg/m2  SpO2 99% HEENT:  No gross abnormalities Pulmonary: Respirations are non-labored Musculoskeletal: There are no major deformities.   Neurologic: No focal weakness or paresthesias are detected, Skin: There are no ulcer or rashes noted. Psychiatric: The patient has normal affect. Cardiovascular: There is a regular rate and rhythm without significant murmur appreciated.   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  Excellent thrill within the fistula  Diagnostic Studies: Ultrasound of the fistula was performed today.  This shows no significant areas of stenosis.    Assessment:  Aneurysmal right arm fistula Plan: I discussed treatment options with the patient.  We discussed proceeding with surgery to repair his aneurysm.  The patient does not feel that it has gotten bigger recently.  He does not have an acute indication for repair of his aneurysm given that he does not have any skin breakdown.  I discussed that he'll be an important to monitor his central venous stenosis, as this is likely contributing to the aneurysm grows.  We discussed the operation (aneurysmorrhaphy).  After her fall full discussion the patient would like to wait before having any surgery done.  We also discussed the role for placing a backup fistula/graft, given that this current fistula is likely going to fail sometime in the future.  I told him I do not believe and placing a access until the existing access has failed.  He was okay with this discussion.  He will contact me when he is ready to have his aneurysm repair.  He understands that he may need to have a temporary catheter while his operation site heals.     Eldridge Abrahams, M.D. [facility] Office: [phone] Pager:  [phone]

## 2013-08-19 ENCOUNTER — Ambulatory Visit (INDEPENDENT_AMBULATORY_CARE_PROVIDER_SITE_OTHER): Payer: Medicare Other | Admitting: General Surgery

## 2013-09-17 ENCOUNTER — Ambulatory Visit (INDEPENDENT_AMBULATORY_CARE_PROVIDER_SITE_OTHER): Payer: Medicare Other | Admitting: General Surgery

## 2013-09-30 ENCOUNTER — Ambulatory Visit (INDEPENDENT_AMBULATORY_CARE_PROVIDER_SITE_OTHER): Payer: Medicare Other | Admitting: Surgery

## 2013-10-05 ENCOUNTER — Ambulatory Visit (INDEPENDENT_AMBULATORY_CARE_PROVIDER_SITE_OTHER): Payer: Medicare Other | Admitting: Surgery

## 2013-10-05 ENCOUNTER — Encounter (INDEPENDENT_AMBULATORY_CARE_PROVIDER_SITE_OTHER): Payer: Self-pay | Admitting: Surgery

## 2013-10-05 VITALS — BP 124/84 | HR 64 | Temp 96.9°F | Resp 14 | Ht 64.0 in | Wt 120.0 lb

## 2013-10-05 DIAGNOSIS — Z0000 Encounter for general adult medical examination without abnormal findings: Secondary | ICD-10-CM

## 2013-10-06 NOTE — Progress Notes (Signed)
Subjective:     Patient ID: Logan Martin, male   DOB: Apr 05, 1956, 58 y.o.   MRN: [id number]  HPI   Review of Systems     Objective:   Physical Exam     Assessment:         Plan:      Patient left emergently without being seen

## 2013-12-29 ENCOUNTER — Emergency Department (HOSPITAL_COMMUNITY): Payer: Medicare Other

## 2013-12-29 ENCOUNTER — Emergency Department (HOSPITAL_COMMUNITY)
Admission: EM | Admit: 2013-12-29 | Discharge: 2013-12-29 | Discharge status: Against Medical Advice | Payer: Medicare Other | Attending: Emergency Medicine | Admitting: Emergency Medicine

## 2013-12-29 ENCOUNTER — Encounter (HOSPITAL_COMMUNITY): Payer: Self-pay | Admitting: Emergency Medicine

## 2013-12-29 DIAGNOSIS — Z992 Dependence on renal dialysis: Secondary | ICD-10-CM | POA: Diagnosis not present

## 2013-12-29 DIAGNOSIS — E875 Hyperkalemia: Secondary | ICD-10-CM

## 2013-12-29 DIAGNOSIS — Z79899 Other long term (current) drug therapy: Secondary | ICD-10-CM | POA: Diagnosis not present

## 2013-12-29 DIAGNOSIS — Z87891 Personal history of nicotine dependence: Secondary | ICD-10-CM | POA: Diagnosis not present

## 2013-12-29 DIAGNOSIS — I120 Hypertensive chronic kidney disease with stage 5 chronic kidney disease or end stage renal disease: Secondary | ICD-10-CM | POA: Diagnosis not present

## 2013-12-29 DIAGNOSIS — F1010 Alcohol abuse, uncomplicated: Secondary | ICD-10-CM | POA: Diagnosis not present

## 2013-12-29 DIAGNOSIS — Z862 Personal history of diseases of the blood and blood-forming organs and certain disorders involving the immune mechanism: Secondary | ICD-10-CM | POA: Insufficient documentation

## 2013-12-29 DIAGNOSIS — Z9889 Other specified postprocedural states: Secondary | ICD-10-CM | POA: Diagnosis not present

## 2013-12-29 DIAGNOSIS — R197 Diarrhea, unspecified: Secondary | ICD-10-CM | POA: Diagnosis not present

## 2013-12-29 DIAGNOSIS — N186 End stage renal disease: Secondary | ICD-10-CM | POA: Diagnosis not present

## 2013-12-29 DIAGNOSIS — F1411 Cocaine abuse, in remission: Secondary | ICD-10-CM | POA: Diagnosis not present

## 2013-12-29 DIAGNOSIS — Z8619 Personal history of other infectious and parasitic diseases: Secondary | ICD-10-CM | POA: Insufficient documentation

## 2013-12-29 DIAGNOSIS — R0989 Other specified symptoms and signs involving the circulatory and respiratory systems: Secondary | ICD-10-CM | POA: Diagnosis not present

## 2013-12-29 DIAGNOSIS — E119 Type 2 diabetes mellitus without complications: Secondary | ICD-10-CM | POA: Diagnosis not present

## 2013-12-29 DIAGNOSIS — Z86711 Personal history of pulmonary embolism: Secondary | ICD-10-CM | POA: Diagnosis not present

## 2013-12-29 DIAGNOSIS — R112 Nausea with vomiting, unspecified: Secondary | ICD-10-CM | POA: Insufficient documentation

## 2013-12-29 LAB — BASIC METABOLIC PANEL
Anion gap: 18 — ABNORMAL HIGH (ref 5–15)
BUN: 53 mg/dL — ABNORMAL HIGH (ref 6–23)
CALCIUM: 8.8 mg/dL (ref 8.4–10.5)
CO2: 21 meq/L (ref 19–32)
CREATININE: 12.32 mg/dL — AB (ref 0.50–1.35)
Chloride: 102 mEq/L (ref 96–112)
GFR calc Af Amer: 5 mL/min — ABNORMAL LOW (ref >90)
GFR calc non Af Amer: 4 mL/min — ABNORMAL LOW (ref >90)
Glucose, Bld: 76 mg/dL (ref 70–99)
Potassium: 5.5 mEq/L — ABNORMAL HIGH (ref 3.7–5.3)
Sodium: 141 mEq/L (ref 137–147)

## 2013-12-29 LAB — CBC
HCT: 32.6 % — ABNORMAL LOW (ref 39.0–52.0)
Hemoglobin: 10.5 g/dL — ABNORMAL LOW (ref 13.0–17.0)
MCH: 31.3 pg (ref 26.0–34.0)
MCHC: 32.2 g/dL (ref 30.0–36.0)
MCV: 97 fL (ref 78.0–100.0)
PLATELETS: 124 10*3/uL — AB (ref 150–400)
RBC: 3.36 MIL/uL — ABNORMAL LOW (ref 4.22–5.81)
RDW: 15.7 % — ABNORMAL HIGH (ref 11.5–15.5)
WBC: 3.9 10*3/uL — ABNORMAL LOW (ref 4.0–10.5)

## 2013-12-29 MED ORDER — SODIUM POLYSTYRENE SULFONATE 15 GM/60ML PO SUSP: 30.0000 g | Freq: Once | ORAL | Status: DC

## 2013-12-29 NOTE — ED Notes (Signed)
Patient left against medical advice.

## 2013-12-29 NOTE — ED Provider Notes (Signed)
CSN: [id number]     Arrival date & time 12/29/13  1024 History   First MD Initiated Contact with Patient 12/29/13 1159     Chief Complaint  Patient presents with  . Nausea  . Emesis  . Diarrhea  . needs dialysis      (Consider location/radiation/quality/duration/timing/severity/associated sxs/prior Treatment) Patient is a 58 y.o. male presenting with vomiting and diarrhea. The history is provided by the patient.  Emesis Severity:  Moderate Timing:  Constant Quality:  Stomach contents Progression:  Worsening Chronicity:  Recurrent Urine output: Doesn't urinate. Relieved by:  Nothing Worsened by:  Nothing tried Associated symptoms: diarrhea   Associated symptoms: no abdominal pain, no chills and no fever   Diarrhea Associated symptoms: vomiting   Associated symptoms: no abdominal pain, no chills and no fever     Past Medical History  Diagnosis Date  . Hx of bladder repair surgery   . Hepatitis C   . Cocaine abuse in remission     Quit date in 1995  . Alcohol abuse     quit date in 1995  . 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 Started HD in [place], [place].  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  On NxStage home hemo since 2010. Using RUA AVF.    [name] Hypertension   . Pulmonary embolism Feb 2012    Treated with coumadin x 1 year  . Renal insufficiency   . Diabetes mellitus without complication   . Anemia   . Peripheral vascular disease    Past Surgical History  Procedure Laterality Date  . Hernia repair    . Bladder surgery    . Av fistula placement    . Coronary artery stent  1999   Family History  Problem Relation Age of Onset  . Cancer Mother   . Diabetes Mother   . Hypertension Mother   . Deep vein thrombosis Sister   . Deep vein thrombosis Daughter    History  Substance Use Topics  . Smoking status: Former Smoker    Quit date: 05/28/1993  . Smokeless tobacco: Never Used  . Alcohol Use: No    Review  of Systems  Constitutional: Negative for fever and chills.  Respiratory: Negative for cough and shortness of breath.   Gastrointestinal: Positive for nausea, vomiting and diarrhea. Negative for abdominal pain.  All other systems reviewed and are negative.     Allergies  Heparin; Pork-derived products; Sulfa antibiotics; and Sulfonamide derivatives  Home Medications   Prior to Admission medications   Medication Sig Start Date End Date Taking? Authorizing Provider  amLODipine (NORVASC) 10 MG tablet Take 10 mg by mouth daily.   Yes Historical Provider, MD  calcium acetate (PHOSLO) 667 MG capsule Take 667 mg by mouth 5 (five) times daily. Takes 1 cap with 3 meals, 1 cap with 2 snacks   Yes Historical Provider, MD  cinacalcet (SENSIPAR) 60 MG tablet Take 60 mg by mouth 2 (two) times daily.   Yes Historical Provider, MD  multivitamin (RENA-VIT) TABS tablet Take 1 tablet by mouth at bedtime. 06/09/13  Yes Clanford Marisa Hua, MD  omeprazole (PRILOSEC) 20 MG capsule Take 20 mg by mouth daily.   Yes Historical Provider, MD  oxycodone (ROXICODONE) 30 MG immediate release tablet Take 30 mg by mouth every 8 (eight) hours as needed for pain.    Yes Historical Provider, MD   BP 173/72  Pulse 83  Temp(Src) 97.8 F (36.6  C) (Oral)  Resp 22  Ht 5\' 4"  (1.626 m)  Wt 116 lb 13.5 oz (53 kg)  BMI 20.05 kg/m2  SpO2 100% Physical Exam  Nursing note and vitals reviewed. Constitutional: He is oriented to person, place, and time. He appears well-developed and well-nourished. No distress.  HENT:  Head: Normocephalic and atraumatic.  Mouth/Throat: Oropharynx is clear and moist. No oropharyngeal exudate.  Eyes: EOM are normal. Pupils are equal, round, and reactive to light.  Neck: Normal range of motion. Neck supple.  Cardiovascular: Normal rate and regular rhythm.  Exam reveals no friction rub.   No murmur heard. Pulmonary/Chest: Effort normal. No respiratory distress. He has no wheezes. He has rales  (mild, bilateral).  Abdominal: He exhibits no distension. There is no tenderness. There is no rebound.  Musculoskeletal: Normal range of motion. He exhibits no edema.  Neurological: He is alert and oriented to person, place, and time.  Skin: He is not diaphoretic.    ED Course  Procedures (including critical care time) Labs Review Labs Reviewed  CBC - Abnormal; Notable for the following:    WBC 3.9 (*)    RBC 3.36 (*)    Hemoglobin 10.5 (*)    HCT 32.6 (*)    RDW 15.7 (*)    Platelets 124 (*)    All other components within normal limits  BASIC METABOLIC PANEL - Abnormal; Notable for the following:    Potassium 5.5 (*)    BUN 53 (*)    Creatinine, Ser 12.32 (*)    GFR calc non Af Amer 4 (*)    GFR calc Af Amer 5 (*)    Anion gap 18 (*)    All other components within normal limits    Imaging Review Dg Chest Port 1 View  12/29/2013   CLINICAL DATA:  Vomiting.  EXAM: PORTABLE CHEST - 1 VIEW  COMPARISON:  None.  FINDINGS: Mediastinum and hilar structures normal. Cardiomegaly with pulmonary vascular prominence and interstitial prominence consistent with congestive heart failure with pulmonary edema. No pleural effusion or pneumothorax. Surgical clips left axilla.  IMPRESSION: Congestive heart failure with pulmonary interstitial edema.   Electronically Signed   By: Marcello Moores  Register   On: 12/29/2013 13:21     EKG Interpretation None      MDM   Final diagnoses:  Hyperkalemia  Non-intractable vomiting with nausea, vomiting of unspecified type    23M here from [place] - does Dialysis Tues/Thurs/Sat. Supposed to have dialysis today but didn't get it. He was supposed to return to [place] to his normal dialysis but couldn't because his car broke down. He gets nauseated and will vomit when he needs dialysis. He's returning to [place] tomorrow hopefully. No SOB, but feels like he's about 6-8 hours away from getting SOB. Will check labs, get CXR. On re-exam, sleeping comfortably. Labs  with mild hyperK at 5.5. CXR with fluids in his lungs. Dr. Mercy Moore with Nephrology states doesn't meet inpatient dialysis criteria. I agree. He is stable to go home and have dialysis in 1-2 days with limiting fluids. Unable to arrange outpatient dialysis. Patient very angry, left without getting his kayexalate. Would not wait for meds or discharge papers. Easily ambulatory out of the hospital on his own accord.  Osvaldo Shipper, MD 12/29/13 [phone]
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
## 2013-12-29 NOTE — ED Notes (Signed)
I did not see the patient leave, however,  Dr. Mingo Amber did speak with him and he said he was walking out and did not want to stay any longer.

## 2013-12-29 NOTE — ED Notes (Addendum)
Pt c/o N/V/D onset last night. Pt also repots needs dialysis. Dialysis days are T,TH,SA. Pt is from out of town ([place]) and was suppose to leave Sunday night but has car issues.

## 2013-12-29 NOTE — ED Notes (Signed)
MD at bedside. 

## 2013-12-29 NOTE — ED Notes (Signed)
Patient ran off without anyone knowing!!!

## 2014-01-14 ENCOUNTER — Encounter: Payer: Self-pay | Admitting: Internal Medicine

## 2014-07-07 ENCOUNTER — Encounter (HOSPITAL_COMMUNITY): Payer: Self-pay | Admitting: Emergency Medicine

## 2014-07-07 ENCOUNTER — Emergency Department (HOSPITAL_COMMUNITY)
Admission: EM | Admit: 2014-07-07 | Discharge: 2014-07-08 | Disposition: A | Discharge status: Home / Self Care | Payer: Medicare Other | Attending: Emergency Medicine | Admitting: Emergency Medicine

## 2014-07-07 DIAGNOSIS — R112 Nausea with vomiting, unspecified: Secondary | ICD-10-CM | POA: Diagnosis present

## 2014-07-07 DIAGNOSIS — Z86711 Personal history of pulmonary embolism: Secondary | ICD-10-CM | POA: Insufficient documentation

## 2014-07-07 DIAGNOSIS — Z8619 Personal history of other infectious and parasitic diseases: Secondary | ICD-10-CM | POA: Insufficient documentation

## 2014-07-07 DIAGNOSIS — Z9861 Coronary angioplasty status: Secondary | ICD-10-CM | POA: Insufficient documentation

## 2014-07-07 DIAGNOSIS — N186 End stage renal disease: Secondary | ICD-10-CM

## 2014-07-07 DIAGNOSIS — I120 Hypertensive chronic kidney disease with stage 5 chronic kidney disease or end stage renal disease: Secondary | ICD-10-CM | POA: Diagnosis not present

## 2014-07-07 DIAGNOSIS — E119 Type 2 diabetes mellitus without complications: Secondary | ICD-10-CM | POA: Insufficient documentation

## 2014-07-07 DIAGNOSIS — Z79899 Other long term (current) drug therapy: Secondary | ICD-10-CM | POA: Diagnosis not present

## 2014-07-07 DIAGNOSIS — D649 Anemia, unspecified: Secondary | ICD-10-CM | POA: Insufficient documentation

## 2014-07-07 DIAGNOSIS — K469 Unspecified abdominal hernia without obstruction or gangrene: Secondary | ICD-10-CM | POA: Insufficient documentation

## 2014-07-07 DIAGNOSIS — Z992 Dependence on renal dialysis: Secondary | ICD-10-CM | POA: Insufficient documentation

## 2014-07-07 DIAGNOSIS — Z87891 Personal history of nicotine dependence: Secondary | ICD-10-CM | POA: Diagnosis not present

## 2014-07-07 LAB — CBC WITH DIFFERENTIAL/PLATELET
Basophils Absolute: 0 10*3/uL (ref 0.0–0.1)
Basophils Relative: 1 % (ref 0–1)
Eosinophils Absolute: 0.5 10*3/uL (ref 0.0–0.7)
Eosinophils Relative: 12 % — ABNORMAL HIGH (ref 0–5)
HEMATOCRIT: 31.5 % — AB (ref 39.0–52.0)
Hemoglobin: 10.2 g/dL — ABNORMAL LOW (ref 13.0–17.0)
LYMPHS ABS: 1.9 10*3/uL (ref 0.7–4.0)
Lymphocytes Relative: 47 % — ABNORMAL HIGH (ref 12–46)
MCH: 29.7 pg (ref 26.0–34.0)
MCHC: 32.4 g/dL (ref 30.0–36.0)
MCV: 91.6 fL (ref 78.0–100.0)
MONO ABS: 0.3 10*3/uL (ref 0.1–1.0)
MONOS PCT: 8 % (ref 3–12)
Neutro Abs: 1.2 10*3/uL — ABNORMAL LOW (ref 1.7–7.7)
Neutrophils Relative %: 32 % — ABNORMAL LOW (ref 43–77)
Platelets: 162 10*3/uL (ref 150–400)
RBC: 3.44 MIL/uL — AB (ref 4.22–5.81)
RDW: 15.8 % — ABNORMAL HIGH (ref 11.5–15.5)
WBC: 3.9 10*3/uL — ABNORMAL LOW (ref 4.0–10.5)

## 2014-07-07 LAB — I-STAT CHEM 8, ED
BUN: 51 mg/dL — ABNORMAL HIGH (ref 6–23)
CALCIUM ION: 1.06 mmol/L — AB (ref 1.12–1.23)
Chloride: 101 mmol/L (ref 96–112)
Creatinine, Ser: 15.2 mg/dL — ABNORMAL HIGH (ref 0.50–1.35)
Glucose, Bld: 79 mg/dL (ref 70–99)
HCT: 33 % — ABNORMAL LOW (ref 39.0–52.0)
Hemoglobin: 11.2 g/dL — ABNORMAL LOW (ref 13.0–17.0)
Potassium: 4.7 mmol/L (ref 3.5–5.1)
Sodium: 139 mmol/L (ref 135–145)
TCO2: 24 mmol/L (ref 0–100)

## 2014-07-07 MED ORDER — ONDANSETRON HCL 4 MG/2ML IJ SOLN
4.0000 mg | Freq: Once | INTRAMUSCULAR | Status: AC
  Administered 2014-07-07: 4 mg via INTRAVENOUS
  Filled 2014-07-07: qty 2

## 2014-07-07 NOTE — ED Notes (Signed)
Pt. missed his hemodialysis today reports nausea and vomitnng , feels  " bloated". Respirations unlabored , denies fever .

## 2014-07-07 NOTE — ED Provider Notes (Signed)
CSN: [id number]     Arrival date & time 07/07/14  2249 History  This chart was scribed for Everlene Balls, MD by Delphia Grates, ED Scribe. This patient was seen in room D30C/D30C and the patient's care was started at 11:07 PM.     No chief complaint on file.  The history is provided by the patient. No language interpreter was used.     HPI Comments: Logan Martin is a 59 y.o. male, with history of HTN, PE, DM, hepatitis C, who presents to the Emergency Department for a missed dialysis treatment. Patient is from [place] and states he has not been able to get dialysis treatment due to possible hepatitis, and was informed to come to the ED to be tested. Patient states his last treatment was 4 days ago (every Tuesday, Thursday, and Saturday). He states he feels "bloated" with associated nausea, and 3-4 episodes of vomiting. He denies SOB, chest pain, or abdominal pain. Patient is currently not on any anticoagulants.  Past Medical History  Diagnosis Date  . Hx of bladder repair surgery   . Hepatitis C   . Cocaine abuse in remission     Quit date in 1995  . Alcohol abuse     quit date in 1995  . 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 Started HD in [place], [place].  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  On NxStage home hemo since 2010. Using RUA AVF.    [name] Hypertension   . Pulmonary embolism Feb 2012    Treated with coumadin x 1 year  . Renal insufficiency   . Diabetes mellitus without complication   . Anemia   . Peripheral vascular disease    Past Surgical History  Procedure Laterality Date  . Hernia repair    . Bladder surgery    . Av fistula placement    . Coronary artery stent  1999   Family History  Problem Relation Age of Onset  . Cancer Mother   . Diabetes Mother   . Hypertension Mother   . Deep vein thrombosis Sister   . Deep vein thrombosis Daughter    History  Substance Use Topics  . Smoking status: Former Smoker   Quit date: 05/28/1993  . Smokeless tobacco: Never Used  . Alcohol Use: No    Review of Systems A complete 10 system review of systems was obtained and all systems are negative except as noted in the HPI and PMH.     Allergies  Heparin; Pork-derived products; Sulfa antibiotics; and Sulfonamide derivatives  Home Medications   Prior to Admission medications   Medication Sig Start Date End Date Taking? Authorizing Provider  amLODipine (NORVASC) 10 MG tablet Take 10 mg by mouth daily.    Historical Provider, MD  calcium acetate (PHOSLO) 667 MG capsule Take 667 mg by mouth 5 (five) times daily. Takes 1 cap with 3 meals, 1 cap with 2 snacks    Historical Provider, MD  cinacalcet (SENSIPAR) 60 MG tablet Take 60 mg by mouth 2 (two) times daily.    Historical Provider, MD  multivitamin (RENA-VIT) TABS tablet Take 1 tablet by mouth at bedtime. 06/09/13   Clanford Marisa Hua, MD  omeprazole (PRILOSEC) 20 MG capsule Take 20 mg by mouth daily.    Historical Provider, MD  oxycodone (ROXICODONE) 30 MG immediate release tablet Take 30 mg by mouth every 8 (eight) hours as needed for pain.     Historical Provider,  MD   Triage Vitals: BP 175/75 mmHg  Pulse 88  Temp(Src) 97.9 F (36.6 C) (Oral)  Resp 18  SpO2 98%  Physical Exam  Constitutional: He is oriented to person, place, and time. Vital signs are normal. He appears well-developed and well-nourished.  Non-toxic appearance. He does not appear ill. No distress.  HENT:  Head: Normocephalic and atraumatic.  Nose: Nose normal.  Mouth/Throat: Oropharynx is clear and moist. No oropharyngeal exudate.  Eyes: Conjunctivae and EOM are normal. Pupils are equal, round, and reactive to light. No scleral icterus.  Neck: Normal range of motion. Neck supple. No tracheal deviation, no edema, no erythema and normal range of motion present. No thyroid mass and no thyromegaly present.  Cardiovascular: Normal rate, regular rhythm, S1 normal, S2 normal, normal heart  sounds, intact distal pulses and normal pulses.  Exam reveals no gallop and no friction rub.   No murmur heard. Pulses:      Radial pulses are 2+ on the right side, and 2+ on the left side.       Dorsalis pedis pulses are 2+ on the right side, and 2+ on the left side.  Pulmonary/Chest: Effort normal and breath sounds normal. No respiratory distress. He has no wheezes. He has no rhonchi. He has no rales.  Abdominal: Soft. Normal appearance and bowel sounds are normal. He exhibits no distension, no ascites and no mass. There is no hepatosplenomegaly. There is no tenderness. There is no rebound, no guarding and no CVA tenderness.  Midline abdominal hernia.  Musculoskeletal: Normal range of motion. He exhibits no edema or tenderness.  RUE AV fistula with palpable thrill.  Lymphadenopathy:    He has no cervical adenopathy.  Neurological: He is alert and oriented to person, place, and time. He has normal strength. No cranial nerve deficit or sensory deficit.  Skin: Skin is warm, dry and intact. No petechiae and no rash noted. He is not diaphoretic. No erythema. No pallor.  Psychiatric: He has a normal mood and affect. His behavior is normal. Judgment normal.  Nursing note and vitals reviewed.   ED Course  Procedures (including critical care time)  DIAGNOSTIC STUDIES: Oxygen Saturation is 98% on room air, normal by my interpretation.    COORDINATION OF CARE: At 2314 Discussed treatment plan with patient which includes labs. Patient agrees.   Labs Review Labs Reviewed  CBC WITH DIFFERENTIAL/PLATELET - Abnormal; Notable for the following:    WBC 3.9 (*)    RBC 3.44 (*)    Hemoglobin 10.2 (*)    HCT 31.5 (*)    RDW 15.8 (*)    Neutrophils Relative % 32 (*)    Neutro Abs 1.2 (*)    Lymphocytes Relative 47 (*)    Eosinophils Relative 12 (*)    All other components within normal limits  COMPREHENSIVE METABOLIC PANEL - Abnormal; Notable for the following:    BUN 54 (*)    Creatinine, Ser  16.12 (*)    GFR calc non Af Amer 3 (*)    GFR calc Af Amer 3 (*)    All other components within normal limits  LIPASE, BLOOD - Abnormal; Notable for the following:    Lipase 68 (*)    All other components within normal limits  I-STAT CHEM 8, ED - Abnormal; Notable for the following:    BUN 51 (*)    Creatinine, Ser 15.20 (*)    Calcium, Ion 1.06 (*)    Hemoglobin 11.2 (*)    HCT  33.0 (*)    All other components within normal limits  HEPATITIS PANEL, ACUTE    Imaging Review No results found.   EKG Interpretation   Date/Time:  Wednesday July 07 2014 23:40:21 EST Ventricular Rate:  78 PR Interval:  159 QRS Duration: 86 QT Interval:  439 QTC Calculation: 500 R Axis:   66 Text Interpretation:  Sinus rhythm Anterior infarct, old Prolonged QT  interval No significant change since last tracing Confirmed by Glynn Octave [phone]) on 07/07/2014 11:44:00 PM      MDM   Final diagnoses:  None   patient presents to the emergency department for missed dialysis session. His only symptoms are bloating and nausea. Will evaluate for emergent need for hemodialysis. Potassium is 4.7, creatinine 15.2. Patient appears stable, oxygen saturation is 98% on room air and he is nontoxic-appearing. Hepatitis panel was sent so the patient can get out-patient dialysis upon discharge from the emergency department. He was advised to call his dialysis center tomorrow for possible treatment.   I personally performed the services described in this documentation, which was scribed in my presence. The recorded information has been reviewed and is accurate.   Everlene Balls, MD 07/08/14 0020

## 2014-07-07 NOTE — Discharge Instructions (Signed)
End-Stage Kidney Disease Logan Martin, you were seen today for missing dialysis.  Your blood work show that you do not need dialysis emergently right now.  Your hepatitis panel was sent.  Follow-up with your dialysis clinic tomorrow to see if he can get your dialysis session. If you have any worsening symptoms come back to emergency department immediately. Thank you. The kidneys are two organs that lie on either side of the spine between the middle of the back and the front of the abdomen. The kidneys:   Remove wastes and extra water from the blood.   Produce important hormones. These help keep bones strong, regulate blood pressure, and help create red blood cells.   Balance the fluids and chemicals in the blood and tissues. End-stage kidney disease occurs when the kidneys are so damaged that they cannot do their job. When the kidneys cannot do their job, life-threatening problems occur. The body cannot stay clean and strong without the help of the kidneys. In end-stage kidney disease, the kidneys cannot get better.You need a new kidney or treatments to do some of the work healthy kidneys do in order to stay alive. CAUSES  End-stage kidney disease usually occurs when a long-lasting (chronic) kidney disease gets worse. It may also occur after the kidneys are suddenly damaged (acute kidney injury).  SYMPTOMS   Swelling (edema) of the legs, ankles, or feet.   Tiredness (lethargy).   Nausea or vomiting.   Confusion.   Problems with urination, such as:   Decreased urine production.   Frequent urination, especially at night.   Frequent accidents in children who are potty trained.   Muscle twitches and cramps.   Persistent itchiness.   Loss of appetite.   Headaches.   Abnormally dark or light skin.   Numbness in the hands or feet.   Easy bruising.   Frequent hiccups.   Menstruation stops. DIAGNOSIS  Your health care provider will measure your blood pressure  and take some tests. These may include:   Urine tests.   Blood tests.   Imaging tests, such as:   An ultrasound exam.   Computed tomography (CT).  A kidney biopsy. TREATMENT  There are two treatments for end-stage kidney disease:   A procedure that removes toxic wastes from the body (dialysis).   Receiving a new kidney (kidney transplant). Both of these treatments have serious risks and consequences. Your health care provider will help you determine which treatment is best for you based on your health, age, and other factors. In addition to having dialysis or a kidney transplant, you may need to take medicines to control high blood pressure (hypertension) and cholesterol and to decrease phosphorus levels in your blood.  HOME CARE INSTRUCTIONS  Follow your prescribed diet.   Take medicines only as directed by your health care provider.   Do not take any new medicines (prescription, over-the-counter, or nutritional supplements) unless approved by your health care provider. Many medicines can worsen your kidney damage or need to have the dose adjusted.   Keep all follow-up visits as directed by your health care provider. MAKE SURE YOU:  Understand these instructions.  Will watch your condition.  Will get help right away if you are not doing well or get worse. Document Released: 08/04/2003 Document Revised: 09/28/2013 Document Reviewed: 01/11/2012 [facility] Patient Information 2015 [employer], [place]. This information is not intended to replace advice given to you by your health care provider. Make sure you discuss any questions you have with your health  care provider.  

## 2014-07-08 ENCOUNTER — Telehealth (HOSPITAL_COMMUNITY): Payer: Self-pay

## 2014-07-08 LAB — COMPREHENSIVE METABOLIC PANEL
ALT: 10 U/L (ref 0–53)
ANION GAP: 9 (ref 5–15)
AST: 16 U/L (ref 0–37)
Albumin: 3.6 g/dL (ref 3.5–5.2)
Alkaline Phosphatase: 97 U/L (ref 39–117)
BUN: 54 mg/dL — AB (ref 6–23)
CO2: 31 mmol/L (ref 19–32)
CREATININE: 16.12 mg/dL — AB (ref 0.50–1.35)
Calcium: 8.8 mg/dL (ref 8.4–10.5)
Chloride: 100 mmol/L (ref 96–112)
GFR calc non Af Amer: 3 mL/min — ABNORMAL LOW (ref >90)
GFR, EST AFRICAN AMERICAN: 3 mL/min — AB (ref >90)
Glucose, Bld: 83 mg/dL (ref 70–99)
Potassium: 4.7 mmol/L (ref 3.5–5.1)
SODIUM: 140 mmol/L (ref 135–145)
TOTAL PROTEIN: 6.9 g/dL (ref 6.0–8.3)
Total Bilirubin: 0.9 mg/dL (ref 0.3–1.2)

## 2014-07-08 LAB — HEPATITIS PANEL, ACUTE
HCV AB: REACTIVE — AB
HEP A IGM: NONREACTIVE
HEP B C IGM: NONREACTIVE
HEP B S AG: NEGATIVE

## 2014-07-08 LAB — LIPASE, BLOOD: LIPASE: 68 U/L — AB (ref 11–59)

## 2014-07-09 ENCOUNTER — Telehealth (HOSPITAL_COMMUNITY): Payer: Self-pay

## 2014-07-09 NOTE — Telephone Encounter (Signed)
Pt with HCV Ab Reactive.  Pt here for Hep B surface Ag.  Results faxed to [facility] Dr Lorrene Reid @ [phone] and [facility].  Attempted to reach pt to inform him but mailbox full.

## 2016-05-28 ENCOUNTER — Encounter (HOSPITAL_COMMUNITY): Payer: Self-pay | Admitting: *Deleted

## 2016-05-28 ENCOUNTER — Emergency Department (HOSPITAL_COMMUNITY)
Admission: EM | Admit: 2016-05-28 | Discharge: 2016-05-28 | Disposition: A | Discharge status: Home / Self Care | Payer: Medicare Other | Attending: Emergency Medicine | Admitting: Emergency Medicine

## 2016-05-28 DIAGNOSIS — Z79899 Other long term (current) drug therapy: Secondary | ICD-10-CM | POA: Diagnosis not present

## 2016-05-28 DIAGNOSIS — N186 End stage renal disease: Secondary | ICD-10-CM | POA: Insufficient documentation

## 2016-05-28 DIAGNOSIS — R1030 Lower abdominal pain, unspecified: Secondary | ICD-10-CM | POA: Diagnosis present

## 2016-05-28 DIAGNOSIS — I2510 Atherosclerotic heart disease of native coronary artery without angina pectoris: Secondary | ICD-10-CM | POA: Insufficient documentation

## 2016-05-28 DIAGNOSIS — E1122 Type 2 diabetes mellitus with diabetic chronic kidney disease: Secondary | ICD-10-CM | POA: Insufficient documentation

## 2016-05-28 DIAGNOSIS — Z87891 Personal history of nicotine dependence: Secondary | ICD-10-CM | POA: Diagnosis not present

## 2016-05-28 DIAGNOSIS — I120 Hypertensive chronic kidney disease with stage 5 chronic kidney disease or end stage renal disease: Secondary | ICD-10-CM | POA: Diagnosis not present

## 2016-05-28 DIAGNOSIS — Z992 Dependence on renal dialysis: Secondary | ICD-10-CM | POA: Diagnosis not present

## 2016-05-28 DIAGNOSIS — Z955 Presence of coronary angioplasty implant and graft: Secondary | ICD-10-CM | POA: Diagnosis not present

## 2016-05-28 DIAGNOSIS — N320 Bladder-neck obstruction: Secondary | ICD-10-CM | POA: Diagnosis not present

## 2016-05-28 LAB — CBC
HCT: 36.4 % — ABNORMAL LOW (ref 39.0–52.0)
Hemoglobin: 11.7 g/dL — ABNORMAL LOW (ref 13.0–17.0)
MCH: 30.9 pg (ref 26.0–34.0)
MCHC: 32.1 g/dL (ref 30.0–36.0)
MCV: 96 fL (ref 78.0–100.0)
PLATELETS: 106 10*3/uL — AB (ref 150–400)
RBC: 3.79 MIL/uL — ABNORMAL LOW (ref 4.22–5.81)
RDW: 16.4 % — ABNORMAL HIGH (ref 11.5–15.5)
WBC: 3.2 10*3/uL — ABNORMAL LOW (ref 4.0–10.5)

## 2016-05-28 LAB — COMPREHENSIVE METABOLIC PANEL
ALBUMIN: 3.2 g/dL — AB (ref 3.5–5.0)
ALT: 10 U/L — AB (ref 17–63)
AST: 19 U/L (ref 15–41)
Alkaline Phosphatase: 66 U/L (ref 38–126)
Anion gap: 11 (ref 5–15)
BUN: 16 mg/dL (ref 6–20)
CALCIUM: 10 mg/dL (ref 8.9–10.3)
CO2: 28 mmol/L (ref 22–32)
CREATININE: 5.74 mg/dL — AB (ref 0.61–1.24)
Chloride: 103 mmol/L (ref 101–111)
GFR calc non Af Amer: 10 mL/min — ABNORMAL LOW (ref >60)
GFR, EST AFRICAN AMERICAN: 11 mL/min — AB (ref >60)
GLUCOSE: 79 mg/dL (ref 65–99)
Potassium: 3.8 mmol/L (ref 3.5–5.1)
SODIUM: 142 mmol/L (ref 135–145)
Total Bilirubin: 0.9 mg/dL (ref 0.3–1.2)
Total Protein: 6.7 g/dL (ref 6.5–8.1)

## 2016-05-28 LAB — URINALYSIS, MICROSCOPIC (REFLEX)
Amorphous Crystal: NONE SEEN
BROAD CASTS UA: NONE SEEN
Budding Yeast: NONE SEEN
CELLULAR CAST UA: NONE SEEN
CRYSTALS: NONE SEEN — AB
CYSTINE CRYS UA: NONE SEEN
Ca Carbonate Crystal: NONE SEEN
Ca Oxalate Crys, UA: NONE SEEN
Ca Phos Crys, UA: NONE SEEN
Epithelial Casts, UA: NONE SEEN
FATTY CASTS UA: NONE SEEN
Fat - U1-Fat: NONE SEEN
Granular Casts, UA: NONE SEEN
Hyaline Casts, UA: NONE SEEN
Hyphae Yeast: NONE SEEN
Leucine Crys, UA: NONE SEEN
MUCUS: NONE SEEN
NON SQUAMOUS EPITHELIAL: NONE SEEN
Oval Fat Body: NONE SEEN
RBC / HPF: NONE SEEN RBC/hpf (ref 0–5)
RBC Casts, UA: NONE SEEN
Sperm, UA: NONE SEEN
Squamous Epithelial / HPF: NONE SEEN
Trichomonas, UA: NONE SEEN
Triple Phosphate Crystal: NONE SEEN
Tyrosine Crys, UA: NONE SEEN
Uric Acid Crys, UA: NONE SEEN
WBC CLUMPS: NONE SEEN
WBC Casts, UA: NONE SEEN
Waxy Casts, UA: NONE SEEN

## 2016-05-28 LAB — LIPASE, BLOOD: Lipase: 20 U/L (ref 11–51)

## 2016-05-28 MED ORDER — DEXTROSE 5 % IV SOLN
2.0000 g | Freq: Once | INTRAVENOUS | Status: AC
  Administered 2016-05-28: 2 g via INTRAVENOUS
  Filled 2016-05-28: qty 2

## 2016-05-28 NOTE — Discharge Instructions (Signed)
Your bladder was full of mucus. It could be some irritation and possible infection. He been given antibiotics here. Follow-up with [facility] this week. Return sooner if he develops fevers or increased pain.

## 2016-05-28 NOTE — ED Notes (Signed)
Pt stable, understands discharge instructions, and reasons for return.   

## 2016-05-28 NOTE — ED Provider Notes (Signed)
[facility] DEPT Provider Note   CSN: [id number] Arrival date & time: 05/28/16  1425     History   Chief Complaint Chief Complaint  Patient presents with  . Abdominal Pain    HPI Logan Martin is a 61 y.o. male.  HPI Patient has had pain in his lower abdomen for the last 2-3 days. He is a dialysis patient. States it is crampy pain in his lower abdomen. Has had previous bladder surgery. He is on dialysis and makes her urine but states last time he had pain like this is because his bladder filled with mucus and was painful. States he needed antibiotics at that time too. Denies fever at this time. Past Medical History:  Diagnosis Date  . Alcohol abuse    quit date in 1995  . Anemia   . Cocaine abuse in remission    Quit date in 1995  . Diabetes mellitus without complication ([place])   . 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 Started HD in [place], [place].  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  On NxStage home hemo since 2010. Using RUA AVF.    [name] Hepatitis C   . Hx of bladder repair surgery   . Hypertension   . Peripheral vascular disease ([place])   . Pulmonary embolism [facility]) Feb 2012   Treated with coumadin x 1 year  . Renal insufficiency     Patient Active Problem List   Diagnosis Date Noted  . Mechanical complication of other vascular device, implant, and graft 08/10/2013  . End stage renal disease ([place]) 08/10/2013  . ESRD (end stage renal disease) ([place]) 06/07/2013  . Nausea and vomiting 06/07/2013  . SOB (shortness of breath) 05/10/2013  . Chest discomfort 11/27/2012  . Shortness of breath 11/27/2012  . Hepatitis C antibody test positive 02/04/2012  . Secondary hyperparathyroidism (of renal origin) 02/04/2012  . Anemia associated with chronic renal failure 02/04/2012  . CAD (coronary atherosclerotic disease) 02/04/2012  . Pulmonary embolism ([place]) 07/20/2010  . CORONARY ARTERY DISEASE 02/23/2009  . HEMORRHOIDS, INTERNAL  02/23/2009  . GERD 02/23/2009  . ESRD on dialysis [facility]) 02/16/2009    Past Surgical History:  Procedure Laterality Date  . AV FISTULA PLACEMENT    . BLADDER SURGERY    . coronary artery stent  1999  . HERNIA REPAIR         Home Medications    Prior to Admission medications   Medication Sig Start Date End Date Taking? Authorizing Provider  amLODipine (NORVASC) 10 MG tablet Take 10 mg by mouth daily.    Historical Provider, MD  calcium acetate (PHOSLO) 667 MG capsule Take 667 mg by mouth 5 (five) times daily. Takes 1 cap with 3 meals, 1 cap with 2 snacks    Historical Provider, MD  cinacalcet (SENSIPAR) 60 MG tablet Take 60 mg by mouth 2 (two) times daily.    Historical Provider, MD  multivitamin (RENA-VIT) TABS tablet Take 1 tablet by mouth at bedtime. 06/09/13   Clan[place] Marisa Hua, MD  omeprazole (PRILOSEC) 20 MG capsule Take 20 mg by mouth daily.    Historical Provider, MD  oxycodone (ROXICODONE) 30 MG immediate release tablet Take 30 mg by mouth every 8 (eight) hours as needed for pain.     Historical Provider, MD    Family History Family History  Problem Relation Age of Onset  . Cancer Mother   . Diabetes Mother   . Hypertension Mother   .  Deep vein thrombosis Sister   . Deep vein thrombosis Daughter     Social History Social History  Substance Use Topics  . Smoking status: Former Smoker    Quit date: 05/28/1993  . Smokeless tobacco: Never Used  . Alcohol use No     Allergies   Heparin; Pork-derived products; Sulfa antibiotics; and Sulfonamide derivatives   Review of Systems Review of Systems  Constitutional: Negative for appetite change.  HENT: Negative for congestion.   Respiratory: Negative for shortness of breath.   Gastrointestinal: Positive for abdominal pain and nausea.  Genitourinary: Negative for flank pain.  Musculoskeletal: Negative for back pain.  Neurological: Negative for headaches.  Hematological: Negative for adenopathy.    Psychiatric/Behavioral: Negative for confusion.     Physical Exam Updated Vital Signs BP 156/79   Pulse 79   Temp 97.9 F (36.6 C)   Resp 17   Ht 5\' 4"  (1.626 m)   Wt 107 lb 9 oz (48.8 kg)   SpO2 99%   BMI 18.46 kg/m   Physical Exam  Constitutional: He appears well-developed.  HENT:  Head: Atraumatic.  Eyes: EOM are normal.  Neck: Neck supple.  Cardiovascular: Normal rate.   Pulmonary/Chest: Effort normal.  Abdominal: He exhibits mass. There is tenderness.  Scars on lower abdomen from previous surgeries. Dilated vessels on surface of abdomen. Lower abdomen feels full and tender up to above the umbilicus. No hernias palpated.  Musculoskeletal: He exhibits no edema.  Neurological: He is alert.  Skin: Skin is warm.     ED Treatments / Results  Labs (all labs ordered are listed, but only abnormal results are displayed) Labs Reviewed  COMPREHENSIVE METABOLIC PANEL - Abnormal; Notable for the following:       Result Value   Creatinine, Ser 5.74 (*)    Albumin 3.2 (*)    ALT 10 (*)    GFR calc non Af Amer 10 (*)    GFR calc Af Amer 11 (*)    All other components within normal limits  CBC - Abnormal; Notable for the following:    WBC 3.2 (*)    RBC 3.79 (*)    Hemoglobin 11.7 (*)    HCT 36.4 (*)    RDW 16.4 (*)    Platelets 106 (*)    All other components within normal limits  URINALYSIS, MICROSCOPIC (REFLEX) - Abnormal; Notable for the following:    Bacteria, UA MANY (*)    Crystals NONE SEEN (*)    All other components within normal limits  URINE CULTURE  LIPASE, BLOOD    EKG  EKG Interpretation None       Radiology No results found.  Procedures Procedures (including critical care time)  Medications Ordered in ED Medications  cefTRIAXone (ROCEPHIN) 2 g in dextrose 5 % 50 mL IVPB (0 g Intravenous Stopped 05/28/16 2157)     Initial Impression / Assessment and Plan / ED Course  I have reviewed the triage vital signs and the nursing  notes.  Pertinent labs & imaging results that were available during my care of the patient were reviewed by me and considered in my medical decision making (see chart for details).  Clinical Course     Patient with abdominal pain. Has an augmented bladder and is on dialysis. It's mucousy discharge that is filled up the bladder. Has had this in the past. Foley catheter placed and irrigated out with removal of several 100 mL of mucus. Discussed with Dr. Jeffie Pollock from vascular surgery.  Will give antibiotics and have follow-up in the clinic. Does not appear septic at this time.  Final Clinical Impressions(s) / ED Diagnoses   Final diagnoses:  Bladder outlet obstruction    New Prescriptions Discharge Medication List as of 05/28/2016  9:48 PM       Davonna Belling, MD 05/29/16 0447

## 2016-05-28 NOTE — ED Triage Notes (Signed)
THE PT IS C/O ABD PAIN FOR 2-3 DAYS  HE IS A DIALYSIS PT DIALYZED Sunday  FISTULA RT UPPER ARM  HE HAS HAD THIS BEFORE HE DOES NOT MAKE URINE BUT THE LAST TIME HE HAD TO BE CATHED AND  INFECTION HAD HIS BLADDER FULL NO TEMP

## 2016-05-28 NOTE — ED Notes (Signed)
Foley irrigated with 100cc of Ns.

## 2016-05-30 LAB — URINE CULTURE

## 2016-06-14 ENCOUNTER — Encounter (HOSPITAL_COMMUNITY): Payer: Self-pay | Admitting: Emergency Medicine

## 2016-06-14 ENCOUNTER — Inpatient Hospital Stay (HOSPITAL_COMMUNITY)
Admission: EM | Admit: 2016-06-14 | Discharge: 2016-06-17 | DRG: 299 | Disposition: A | Discharge status: Home / Self Care | Payer: Medicare Other | Attending: Internal Medicine | Admitting: Internal Medicine

## 2016-06-14 ENCOUNTER — Emergency Department (HOSPITAL_COMMUNITY): Payer: Medicare Other

## 2016-06-14 DIAGNOSIS — K122 Cellulitis and abscess of mouth: Secondary | ICD-10-CM

## 2016-06-14 DIAGNOSIS — M79602 Pain in left arm: Secondary | ICD-10-CM

## 2016-06-14 DIAGNOSIS — H9193 Unspecified hearing loss, bilateral: Secondary | ICD-10-CM | POA: Diagnosis present

## 2016-06-14 DIAGNOSIS — B182 Chronic viral hepatitis C: Secondary | ICD-10-CM | POA: Diagnosis present

## 2016-06-14 DIAGNOSIS — M898X9 Other specified disorders of bone, unspecified site: Secondary | ICD-10-CM | POA: Diagnosis present

## 2016-06-14 DIAGNOSIS — Z86711 Personal history of pulmonary embolism: Secondary | ICD-10-CM

## 2016-06-14 DIAGNOSIS — N2581 Secondary hyperparathyroidism of renal origin: Secondary | ICD-10-CM | POA: Diagnosis present

## 2016-06-14 DIAGNOSIS — Z955 Presence of coronary angioplasty implant and graft: Secondary | ICD-10-CM

## 2016-06-14 DIAGNOSIS — E1151 Type 2 diabetes mellitus with diabetic peripheral angiopathy without gangrene: Secondary | ICD-10-CM | POA: Diagnosis present

## 2016-06-14 DIAGNOSIS — E1122 Type 2 diabetes mellitus with diabetic chronic kidney disease: Secondary | ICD-10-CM | POA: Diagnosis present

## 2016-06-14 DIAGNOSIS — I871 Compression of vein: Principal | ICD-10-CM | POA: Diagnosis present

## 2016-06-14 DIAGNOSIS — Z79891 Long term (current) use of opiate analgesic: Secondary | ICD-10-CM

## 2016-06-14 DIAGNOSIS — Z992 Dependence on renal dialysis: Secondary | ICD-10-CM

## 2016-06-14 DIAGNOSIS — I2510 Atherosclerotic heart disease of native coronary artery without angina pectoris: Secondary | ICD-10-CM | POA: Diagnosis present

## 2016-06-14 DIAGNOSIS — M899 Disorder of bone, unspecified: Secondary | ICD-10-CM | POA: Diagnosis present

## 2016-06-14 DIAGNOSIS — Z79899 Other long term (current) drug therapy: Secondary | ICD-10-CM

## 2016-06-14 DIAGNOSIS — R51 Headache: Secondary | ICD-10-CM

## 2016-06-14 DIAGNOSIS — N186 End stage renal disease: Secondary | ICD-10-CM | POA: Diagnosis present

## 2016-06-14 DIAGNOSIS — R221 Localized swelling, mass and lump, neck: Secondary | ICD-10-CM | POA: Diagnosis not present

## 2016-06-14 DIAGNOSIS — D61818 Other pancytopenia: Secondary | ICD-10-CM

## 2016-06-14 DIAGNOSIS — I120 Hypertensive chronic kidney disease with stage 5 chronic kidney disease or end stage renal disease: Secondary | ICD-10-CM | POA: Diagnosis present

## 2016-06-14 DIAGNOSIS — R519 Headache, unspecified: Secondary | ICD-10-CM

## 2016-06-14 DIAGNOSIS — Z87891 Personal history of nicotine dependence: Secondary | ICD-10-CM

## 2016-06-14 HISTORY — DX: Low back pain: M54.5

## 2016-06-14 HISTORY — DX: End stage renal disease: N18.6

## 2016-06-14 HISTORY — DX: Low back pain, unspecified: M54.50

## 2016-06-14 HISTORY — DX: Personal history of urinary calculi: Z87.442

## 2016-06-14 HISTORY — DX: Other chronic pain: G89.29

## 2016-06-14 HISTORY — DX: Personal history of other medical treatment: Z92.89

## 2016-06-14 HISTORY — DX: Dependence on renal dialysis: Z99.2

## 2016-06-14 LAB — CBC WITH DIFFERENTIAL/PLATELET
BASOS ABS: 0 10*3/uL (ref 0.0–0.1)
Basophils Relative: 0 %
EOS ABS: 0.1 10*3/uL (ref 0.0–0.7)
Eosinophils Relative: 3 %
HCT: 35.2 % — ABNORMAL LOW (ref 39.0–52.0)
HEMOGLOBIN: 11.2 g/dL — AB (ref 13.0–17.0)
LYMPHS ABS: 1.1 10*3/uL (ref 0.7–4.0)
Lymphocytes Relative: 42 %
MCH: 30.4 pg (ref 26.0–34.0)
MCHC: 31.8 g/dL (ref 30.0–36.0)
MCV: 95.4 fL (ref 78.0–100.0)
Monocytes Absolute: 0.2 10*3/uL (ref 0.1–1.0)
Monocytes Relative: 8 %
NEUTROS PCT: 47 %
Neutro Abs: 1.2 10*3/uL — ABNORMAL LOW (ref 1.7–7.7)
PLATELETS: 76 10*3/uL — AB (ref 150–400)
RBC: 3.69 MIL/uL — AB (ref 4.22–5.81)
RDW: 15.7 % — ABNORMAL HIGH (ref 11.5–15.5)
WBC: 2.6 10*3/uL — AB (ref 4.0–10.5)

## 2016-06-14 LAB — BASIC METABOLIC PANEL
Anion gap: 14 (ref 5–15)
BUN: 30 mg/dL — ABNORMAL HIGH (ref 6–20)
CHLORIDE: 103 mmol/L (ref 101–111)
CO2: 22 mmol/L (ref 22–32)
CREATININE: 7.12 mg/dL — AB (ref 0.61–1.24)
Calcium: 9.8 mg/dL (ref 8.9–10.3)
GFR, EST AFRICAN AMERICAN: 9 mL/min — AB (ref >60)
GFR, EST NON AFRICAN AMERICAN: 7 mL/min — AB (ref >60)
Glucose, Bld: 75 mg/dL (ref 65–99)
POTASSIUM: 4.3 mmol/L (ref 3.5–5.1)
SODIUM: 139 mmol/L (ref 135–145)

## 2016-06-14 LAB — HEPATIC FUNCTION PANEL
ALBUMIN: 3.2 g/dL — AB (ref 3.5–5.0)
ALK PHOS: 56 U/L (ref 38–126)
ALT: 9 U/L — AB (ref 17–63)
AST: 20 U/L (ref 15–41)
Bilirubin, Direct: <0.1 mg/dL — ABNORMAL LOW (ref 0.1–0.5)
Total Bilirubin: 0.9 mg/dL (ref 0.3–1.2)
Total Protein: 6.3 g/dL — ABNORMAL LOW (ref 6.5–8.1)

## 2016-06-14 MED ORDER — IOPAMIDOL (ISOVUE-300) INJECTION 61%
INTRAVENOUS | Status: AC
  Administered 2016-06-14: 75 mL via INTRAVENOUS
  Filled 2016-06-14: qty 75

## 2016-06-14 MED ORDER — HYDRALAZINE HCL 25 MG PO TABS
50.0000 mg | ORAL_TABLET | Freq: Three times a day (TID) | ORAL | Status: DC
  Administered 2016-06-14 – 2016-06-15 (×2): 50 mg via ORAL
  Filled 2016-06-14 (×2): qty 2

## 2016-06-14 MED ORDER — OXYCODONE HCL 5 MG PO TABS
10.0000 mg | ORAL_TABLET | Freq: Two times a day (BID) | ORAL | Status: DC | PRN
  Administered 2016-06-14: 10 mg via ORAL
  Filled 2016-06-14: qty 2

## 2016-06-14 MED ORDER — LABETALOL HCL 200 MG PO TABS
200.0000 mg | ORAL_TABLET | Freq: Two times a day (BID) | ORAL | Status: DC
  Administered 2016-06-14 – 2016-06-17 (×6): 200 mg via ORAL
  Filled 2016-06-14 (×6): qty 1

## 2016-06-14 MED ORDER — AMPICILLIN-SULBACTAM SODIUM 3 (2-1) G IJ SOLR
3.0000 g | INTRAMUSCULAR | Status: DC
  Administered 2016-06-15: 3 g via INTRAVENOUS
  Filled 2016-06-14: qty 3

## 2016-06-14 MED ORDER — CALCIUM ACETATE (PHOS BINDER) 667 MG PO CAPS
667.0000 mg | ORAL_CAPSULE | ORAL | Status: DC
  Administered 2016-06-14 – 2016-06-17 (×10): 667 mg via ORAL
  Filled 2016-06-14 (×10): qty 1

## 2016-06-14 MED ORDER — RENA-VITE PO TABS
1.0000 | ORAL_TABLET | Freq: Every day | ORAL | Status: DC
  Administered 2016-06-14 – 2016-06-16 (×3): 1 via ORAL
  Filled 2016-06-14 (×3): qty 1

## 2016-06-14 MED ORDER — SODIUM CHLORIDE 0.9 % IV SOLN
3.0000 g | Freq: Once | INTRAVENOUS | Status: AC
  Administered 2016-06-14: 3 g via INTRAVENOUS
  Filled 2016-06-14: qty 3

## 2016-06-14 MED ORDER — AMLODIPINE BESYLATE 10 MG PO TABS
10.0000 mg | ORAL_TABLET | Freq: Every day | ORAL | Status: DC
  Administered 2016-06-15 – 2016-06-17 (×3): 10 mg via ORAL
  Filled 2016-06-14 (×2): qty 1
  Filled 2016-06-14: qty 2
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
## 2016-06-14 NOTE — Procedures (Signed)
Preop diagnosis: Submental edema Postop diagnosis: same Procedure: Transnasal fiberoptic laryngoscopy Surgeon: Redmond Baseman Anesth: Topical with 4% lidocaine Compl: None Findings: The pharynx and larynx appear fairly normal without asymmetric swelling.  Vocal folds move normally.  The upper airway is widely patent.  Adenoid tissue is somewhat prominent.  The epiglottis is slightly red but not edematous. Description:  After discussing risks, benefits, and alternatives, the patient was placed in a seated position and the right nasal passage was sprayed with topical anesthetic.  The fiberoptic scope was passed through the right nasal passage to view the pharynx and larynx.  Findings are noted above.  The scope was then removed and he was returned to nursing care in stable condition.

## 2016-06-14 NOTE — ED Triage Notes (Signed)
Per EMS: pt with facial swelling in face and neck into arms; pt dialysis pt and PCP feels like fluid retention; no distress noted; pt due for dialysis today

## 2016-06-14 NOTE — Progress Notes (Signed)
Pharmacy Antibiotic Note  Logan Martin is a 61 y.o. male admitted on 06/14/2016 with rule out submental space infection.  Pharmacy has been consulted for unasyn dosing. Patient has history of ESRD on HD TTS with last session 06/12/16.  Plan: Unasyn 3g IV q24h after dialysis Monitor culture data, renal function and clinical course     Temp (24hrs), Avg:97.6 F (36.4 C), Min:97.6 F (36.4 C), Max:97.6 F (36.4 C)   Recent Labs Lab 06/14/16 1235  WBC 2.6*  CREATININE 7.12*    CrCl cannot be calculated (Unknown ideal weight.).    Allergies  Allergen Reactions  . Heparin Rash    Pork products   . Pork-Derived Products Rash  . Sulfa Antibiotics Rash  . Sulfonamide Derivatives Rash     Logan Martin. Logan Martin, PharmD, [place] Clinical Pharmacist Pager [phone] 06/14/2016 6:38 PM

## 2016-06-14 NOTE — ED Provider Notes (Addendum)
[facility] DEPT Provider Note   CSN: [id number] Arrival date & time: 06/14/16  1139     History   Chief Complaint Chief Complaint  Patient presents with  . Facial Swelling    HPI Logan Martin is a 61 y.o. male.  HPI Complains of facial pain and swelling at submandibular area for the past 2 weeks. He called 911 today she developed dyspnea when lying supine. He also complains of left arm pain and swelling at upper arm and axilla onset yesterday. Left arm Pain is worse with flexing his left shoulder. No numbness. No weakness. No fever. No other associated symptoms. No treatment prior to coming here. Last dialysis was 2 days ago, stayed entire session. Nothing makes symptoms better or worse. Past Medical History:  Diagnosis Date  . Alcohol abuse    quit date in 1995  . Anemia   . Cocaine abuse in remission    Quit date in 1995  . Diabetes mellitus without complication ([place])   . 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 Started HD in [place], [place].  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  On NxStage home hemo since 2010. Using RUA AVF.    [name] Hepatitis C   . Hx of bladder repair surgery   . Hypertension   . Peripheral vascular disease ([place])   . Pulmonary embolism [facility]) Feb 2012   Treated with coumadin x 1 year  . Renal insufficiency     Patient Active Problem List   Diagnosis Date Noted  . Mechanical complication of other vascular device, implant, and graft 08/10/2013  . End stage renal disease ([place]) 08/10/2013  . ESRD (end stage renal disease) ([place]) 06/07/2013  . Nausea and vomiting 06/07/2013  . SOB (shortness of breath) 05/10/2013  . Chest discomfort 11/27/2012  . Shortness of breath 11/27/2012  . Hepatitis C antibody test positive 02/04/2012  . Secondary hyperparathyroidism (of renal origin) 02/04/2012  . Anemia associated with chronic renal failure 02/04/2012  . CAD (coronary atherosclerotic disease) 02/04/2012  .  Pulmonary embolism ([place]) 07/20/2010  . CORONARY ARTERY DISEASE 02/23/2009  . HEMORRHOIDS, INTERNAL 02/23/2009  . GERD 02/23/2009  . ESRD on dialysis [facility]) 02/16/2009    Past Surgical History:  Procedure Laterality Date  . AV FISTULA PLACEMENT    . BLADDER SURGERY    . coronary artery stent  1999  . HERNIA REPAIR         Home Medications    Prior to Admission medications   Medication Sig Start Date End Date Taking? Authorizing Provider  amLODipine (NORVASC) 10 MG tablet Take 10 mg by mouth daily.   Yes Historical Provider, MD  calcium acetate (PHOSLO) 667 MG capsule Take 667 mg by mouth 5 (five) times daily. Takes 1 cap with 3 meals, 1 cap with 2 snacks   Yes Historical Provider, MD  hydrALAZINE (APRESOLINE) 50 MG tablet Take 50 mg by mouth 3 (three) times daily. 05/11/16  Yes Historical Provider, MD  labetalol (NORMODYNE) 200 MG tablet Take 200 mg by mouth 2 (two) times daily. 05/11/16  Yes Historical Provider, MD  multivitamin (RENA-VIT) TABS tablet Take 1 tablet by mouth at bedtime. 06/09/13  Yes Clanford Marisa Hua, MD  oxycodone (ROXICODONE) 30 MG immediate release tablet Take 30 mg by mouth every 8 (eight) hours as needed for pain.    Yes Historical Provider, MD  Oxycodone HCl 10 MG TABS Take 10 mg by mouth 2 (two) times daily. 05/22/16  Yes Historical Provider, MD    Family History Family History  Problem Relation Age of Onset  . Cancer Mother   . Diabetes Mother   . Hypertension Mother   . Deep vein thrombosis Sister   . Deep vein thrombosis Daughter     Social History Social History  Substance Use Topics  . Smoking status: Former Smoker    Quit date: 05/28/1993  . Smokeless tobacco: Never Used  . Alcohol use No     Allergies   Heparin; Pork-derived products; Sulfa antibiotics; and Sulfonamide derivatives   Review of Systems Review of Systems  Constitutional: Negative.   HENT: Positive for facial swelling.        Swelling and pain in the submandibular  area  Respiratory: Positive for shortness of breath.   Cardiovascular: Negative.   Gastrointestinal: Negative.   Genitourinary:       And uric hemodialysis patient  Musculoskeletal: Positive for arthralgias and myalgias.       Left arm pain and swelling  Skin: Negative.   Allergic/Immunologic: Positive for immunocompromised state.       Hemodialysis patient  Neurological: Negative.   Psychiatric/Behavioral: Negative.   All other systems reviewed and are negative.    Physical Exam Updated Vital Signs BP 182/89   Pulse 76   Temp 97.6 F (36.4 C) (Oral)   Resp 18   SpO2 100%   Physical Exam  Constitutional: He appears well-developed and well-nourished. No distress.  HENT:  Head: Normocephalic and atraumatic.  Right Ear: External ear normal.  Left Ear: External ear normal.  Minimally tender at submandibular area.  Eyes: Conjunctivae are normal. Pupils are equal, round, and reactive to light.  Neck: Neck supple. No tracheal deviation present. No thyromegaly present.  Cardiovascular: Normal rate, regular rhythm and intact distal pulses.   No murmur heard. Pulmonary/Chest: Effort normal and breath sounds normal.  Abdominal: Soft. Bowel sounds are normal. He exhibits no distension. There is no tenderness.  Musculoskeletal: Normal range of motion. He exhibits no edema or tenderness.  Right upper extremity with dialysis fistula with good thrill. Radial pulse 2+ left upper extremity minimally tender at the medial aspect of the upper arm. No appreciable soft tissue swelling. Radial pulse 2+. Good capillary refill. He has a dialysis graft which is old, no thrill. No redness or warmth  Neurological: He is alert. Coordination normal.  Skin: Skin is warm and dry. No rash noted.  Psychiatric: He has a normal mood and affect.  Nursing note and vitals reviewed.    ED Treatments / Results  Labs (all labs ordered are listed, but only abnormal results are displayed) Labs Reviewed  BASIC  METABOLIC PANEL  CBC WITH DIFFERENTIAL/PLATELET    EKG  EKG Interpretation None       Radiology No results found.  Procedures Procedures (including critical care time)  Medications Ordered in ED Medications - No data to display  Results for orders placed or performed during the hospital encounter of 76/28/31  Basic metabolic panel  Result Value Ref Range   Sodium 139 135 - 145 mmol/L   Potassium 4.3 3.5 - 5.1 mmol/L   Chloride 103 101 - 111 mmol/L   CO2 22 22 - 32 mmol/L   Glucose, Bld 75 65 - 99 mg/dL   BUN 30 (H) 6 - 20 mg/dL   Creatinine, Ser 7.12 (H) 0.61 - 1.24 mg/dL   Calcium 9.8 8.9 - 10.3 mg/dL   GFR calc non Af Amer 7 (L) >60 mL/min  GFR calc Af Amer 9 (L) >60 mL/min   Anion gap 14 5 - 15  CBC with Differential/Platelet  Result Value Ref Range   WBC 2.6 (L) 4.0 - 10.5 K/uL   RBC 3.69 (L) 4.22 - 5.81 MIL/uL   Hemoglobin 11.2 (L) 13.0 - 17.0 g/dL   HCT 35.2 (L) 39.0 - 52.0 %   MCV 95.4 78.0 - 100.0 fL   MCH 30.4 26.0 - 34.0 pg   MCHC 31.8 30.0 - 36.0 g/dL   RDW 15.7 (H) 11.5 - 15.5 %   Platelets 76 (L) 150 - 400 K/uL   Neutrophils Relative % 47 %   Neutro Abs 1.2 (L) 1.7 - 7.7 K/uL   Lymphocytes Relative 42 %   Lymphs Abs 1.1 0.7 - 4.0 K/uL   Monocytes Relative 8 %   Monocytes Absolute 0.2 0.1 - 1.0 K/uL   Eosinophils Relative 3 %   Eosinophils Absolute 0.1 0.0 - 0.7 K/uL   Basophils Relative 0 %   Basophils Absolute 0.0 0.0 - 0.1 K/uL   Ct Soft Tissue Neck W Contrast  Result Date: 06/14/2016 CLINICAL DATA:  Anterior neck swelling for 1-1/2 weeks, shoulder pain. Last dialysis 2 days ago. History of hypertension, pulmonary embolism, diabetes. EXAM: CT NECK WITH CONTRAST TECHNIQUE: Multidetector CT imaging of the neck was performed using the standard protocol following the bolus administration of intravenous contrast. CONTRAST:  75 cc ISOVUE-300 IOPAMIDOL (ISOVUE-300) INJECTION 61% COMPARISON:  CT abdomen and pelvis June 01, 2016 and CT chest November 27, 2012 FINDINGS: Pharynx and larynx: Fullness of the nasopharyngeal soft tissues, edematous pharynx. Apposition of the true vocal cords most compatible with phonation. Salivary glands: Normal. Thyroid: Heterogeneous without dominant nodule Lymph nodes: Scattered subcentimeter reniform lymph nodes without lymphadenopathy by CT size criteria. Vascular: Patent RIGHT innominate stent. High-grade stenosis or occlusion RIGHT internal jugular jugular vein proximal to the stent. Limited intracranial: Normal. Trace calcific atherosclerosis carotid siphons. Visualized orbits: Normal. Mastoids and visualized paranasal sinuses: Bilateral middle ear and mastoid effusions. Bony erosions LEFT external auditory canal. Mild paranasal sinus mucosal thickening without air-fluid levels. Skeleton: New sclerotic lesion T 3 anterior vertebral body. Severe C5-6 and C6-7 degenerative discs. Scattered dental caries and periapical lucency/abscess. Upper chest: Moderate LEFT and small to moderate RIGHT pleural effusions. Other: Generalized neck subcutaneous fat stranding and effusions without focal fluid collection. Small retropharyngeal effusion contiguous with the anterior neck. IMPRESSION: Generalized neck edema and effusions (including retropharyngeal effusion) without drainable fluid collection. Bilateral middle ear and mastoid effusions, likely postobstructive from hypertrophic nasopharyngeal soft tissues associated with recent viral illness and immunocompromised states. Additional erosive changes LEFT external auditory canal concerning for malignant otitis media though neoplasm is a consideration. Recommend direct inspection. Increasing moderate LEFT and mild RIGHT pleural effusions. New sclerotic lesion T3 vertebral body, recommend bone scan. No pathologic fracture. Electronically Signed   By: Elon Alas M.D.   On: 06/14/2016 15:35   Initial Impression / Assessment and Plan / ED Course  I have reviewed the triage vital signs  and the nursing notes.  Pertinent labs & imaging results that were available during my care of the patient were reviewed by me and considered in my medical decision making (see chart for details).    Declines pain medicine Dr. Redmond Baseman consulted. Will see patient in hospital. Request Unasyn, admission to internal medicine service. I've also consulted Dr. Moshe Cipro from nephrology service who will see patient in hospital and arrange for hemodialysis. I don't feel the patient needs  emergent hemodialysis and that he is not dyspneic normal potassium.  Anemia and thrombocytopenia are chronic Final Clinical Impressions(s) / ED Diagnoses  Dx #1 Submandibular pain #2 left arm pain #3anemia #4 thrombocytopenia #5 sclerotic lesion of thoracic vertebrae Final diagnoses:  None    New Prescriptions New Prescriptions   No medications on file     Orlie Dakin, MD 06/14/16 [place], MD 06/14/16 1700

## 2016-06-14 NOTE — Consult Note (Signed)
Reason for Consult: Neck swelling Referring Physician: ER  Logan Martin is an 61 y.o. male.  HPI: 61 year old male with multiple medical problems has had two weeks of swelling under the chin with some pain.  He came to the ER today due to the feeling of difficulty breathing when he lies down.  He denies fever or feeling ill.  Swelling is worse in the mornings and better after dialysis.  Past Medical History:  Diagnosis Date  . Alcohol abuse    quit date in 1995  . Anemia   . Cocaine abuse in remission    Quit date in 1995  . Diabetes mellitus without complication ([place])   . 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 Started HD in [place], [place].  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  On NxStage home hemo since 2010. Using RUA AVF.    [name] Hepatitis C   . Hx of bladder repair surgery   . Hypertension   . Peripheral vascular disease ([place])   . Pulmonary embolism [facility]) Feb 2012   Treated with coumadin x 1 year  . Renal insufficiency     Past Surgical History:  Procedure Laterality Date  . AV FISTULA PLACEMENT    . BLADDER SURGERY    . coronary artery stent  1999  . HERNIA REPAIR      Family History  Problem Relation Age of Onset  . Cancer Mother   . Diabetes Mother   . Hypertension Mother   . Deep vein thrombosis Sister   . Deep vein thrombosis Daughter     Social History:  reports that he quit smoking about 23 years ago. He has never used smokeless tobacco. He reports that he does not drink alcohol or use drugs.  Allergies:  Allergies  Allergen Reactions  . Heparin Rash    Pork products   . Pork-Derived Products Rash  . Sulfa Antibiotics Rash  . Sulfonamide Derivatives Rash    Medications: I have reviewed the patient's current medications.  Results for orders placed or performed during the hospital encounter of 06/14/16 (from the past 48 hour(s))  Basic metabolic panel     Status: Abnormal   Collection Time: 06/14/16  12:35 PM  Result Value Ref Range   Sodium 139 135 - 145 mmol/L   Potassium 4.3 3.5 - 5.1 mmol/L   Chloride 103 101 - 111 mmol/L   CO2 22 22 - 32 mmol/L   Glucose, Bld 75 65 - 99 mg/dL   BUN 30 (H) 6 - 20 mg/dL   Creatinine, Ser 7.12 (H) 0.61 - 1.24 mg/dL   Calcium 9.8 8.9 - 10.3 mg/dL   GFR calc non Af Amer 7 (L) >60 mL/min   GFR calc Af Amer 9 (L) >60 mL/min    Comment: (NOTE) The eGFR has been calculated using the CKD EPI equation. This calculation has not been validated in all clinical situations. eGFR's persistently <60 mL/min signify possible Chronic Kidney Disease.    Anion gap 14 5 - 15  CBC with Differential/Platelet     Status: Abnormal   Collection Time: 06/14/16 12:35 PM  Result Value Ref Range   WBC 2.6 (L) 4.0 - 10.5 K/uL   RBC 3.69 (L) 4.22 - 5.81 MIL/uL   Hemoglobin 11.2 (L) 13.0 - 17.0 g/dL   HCT 35.2 (L) 39.0 - 52.0 %   MCV 95.4 78.0 - 100.0 fL   MCH 30.4 26.0 - 34.0 pg  MCHC 31.8 30.0 - 36.0 g/dL   RDW 15.7 (H) 11.5 - 15.5 %   Platelets 76 (L) 150 - 400 K/uL    Comment: PLATELET COUNT CONFIRMED BY SMEAR   Neutrophils Relative % 47 %   Neutro Abs 1.2 (L) 1.7 - 7.7 K/uL   Lymphocytes Relative 42 %   Lymphs Abs 1.1 0.7 - 4.0 K/uL   Monocytes Relative 8 %   Monocytes Absolute 0.2 0.1 - 1.0 K/uL   Eosinophils Relative 3 %   Eosinophils Absolute 0.1 0.0 - 0.7 K/uL   Basophils Relative 0 %   Basophils Absolute 0.0 0.0 - 0.1 K/uL    Ct Soft Tissue Neck W Contrast  Result Date: 06/14/2016 CLINICAL DATA:  Anterior neck swelling for 1-1/2 weeks, shoulder pain. Last dialysis 2 days ago. History of hypertension, pulmonary embolism, diabetes. EXAM: CT NECK WITH CONTRAST TECHNIQUE: Multidetector CT imaging of the neck was performed using the standard protocol following the bolus administration of intravenous contrast. CONTRAST:  75 cc ISOVUE-300 IOPAMIDOL (ISOVUE-300) INJECTION 61% COMPARISON:  CT abdomen and pelvis June 01, 2016 and CT chest November 27, 2012 FINDINGS:  Pharynx and larynx: Fullness of the nasopharyngeal soft tissues, edematous pharynx. Apposition of the true vocal cords most compatible with phonation. Salivary glands: Normal. Thyroid: Heterogeneous without dominant nodule Lymph nodes: Scattered subcentimeter reniform lymph nodes without lymphadenopathy by CT size criteria. Vascular: Patent RIGHT innominate stent. High-grade stenosis or occlusion RIGHT internal jugular jugular vein proximal to the stent. Limited intracranial: Normal. Trace calcific atherosclerosis carotid siphons. Visualized orbits: Normal. Mastoids and visualized paranasal sinuses: Bilateral middle ear and mastoid effusions. Bony erosions LEFT external auditory canal. Mild paranasal sinus mucosal thickening without air-fluid levels. Skeleton: New sclerotic lesion T 3 anterior vertebral body. Severe C5-6 and C6-7 degenerative discs. Scattered dental caries and periapical lucency/abscess. Upper chest: Moderate LEFT and small to moderate RIGHT pleural effusions. Other: Generalized neck subcutaneous fat stranding and effusions without focal fluid collection. Small retropharyngeal effusion contiguous with the anterior neck. IMPRESSION: Generalized neck edema and effusions (including retropharyngeal effusion) without drainable fluid collection. Bilateral middle ear and mastoid effusions, likely postobstructive from hypertrophic nasopharyngeal soft tissues associated with recent viral illness and immunocompromised states. Additional erosive changes LEFT external auditory canal concerning for malignant otitis media though neoplasm is a consideration. Recommend direct inspection. Increasing moderate LEFT and mild RIGHT pleural effusions. New sclerotic lesion T3 vertebral body, recommend bone scan. No pathologic fracture. Electronically Signed   By: Elon Alas M.D.   On: 06/14/2016 15:35    Review of Systems  HENT:       Neck swelling  Respiratory: Positive for shortness of breath.    Musculoskeletal:       Left arm pain  All other systems reviewed and are negative.  Blood pressure 182/89, pulse 76, temperature 97.6 F (36.4 C), temperature source Oral, resp. rate 18, SpO2 100 %. Physical Exam  Constitutional: He is oriented to person, place, and time. He appears well-developed and well-nourished. No distress.  HENT:  Head: Normocephalic and atraumatic.  Right Ear: External ear normal.  Left Ear: External ear normal.  Nose: Nose normal.  Mouth/Throat: Oropharynx is clear and moist.  Both EACs obstructed by cerumen.  Hearing poor.  Soft floor of mouth.  No pharyngeal edema.  No dental tenderness.  Eyes: Conjunctivae and EOM are normal. Pupils are equal, round, and reactive to light.  Neck: Normal range of motion.  Submental area with soft edema, minimal tenderness.  Some more tenderness  with palpation to either side of the larynx.  Cardiovascular: Normal rate.   Respiratory: Effort normal.  Musculoskeletal: Normal range of motion.  Neurological: He is alert and oriented to person, place, and time. No cranial nerve deficit.  Skin: Skin is warm and dry.  Psychiatric: He has a normal mood and affect. His behavior is normal. Judgment and thought content normal.    Assessment/Plan: Submental edema, bilateral middle ear effusions with hearing loss. I personally reviewed his neck CT scan.  His swelling is more in the submental area but is soft and not very tender.  He is more tender along the larynx and the CT suggests some edema in the retropharynx.  I performed fiberoptic exam of the pharynx and larynx which was unremarkable with a widely patent airway.  I agree with hospital admission for IV antibiotic such as Unasyn and management of other medical issues.  His ear will need to be addressed as an outpatient after discharge.  Will follow.  Brayden Betters 06/14/2016, 5:07 PM

## 2016-06-14 NOTE — ED Provider Notes (Signed)
Pt signed out by Dr. Winfred Leeds awaiting call from Medicine for admission.  I spoke with the resident who will come and see patient for admission.  Pt remains stable and airway is intact.   Isla Pence, MD 06/14/16 [phone]

## 2016-06-15 ENCOUNTER — Inpatient Hospital Stay (HOSPITAL_COMMUNITY): Payer: Medicare Other

## 2016-06-15 DIAGNOSIS — N186 End stage renal disease: Secondary | ICD-10-CM | POA: Diagnosis not present

## 2016-06-15 DIAGNOSIS — B182 Chronic viral hepatitis C: Secondary | ICD-10-CM | POA: Diagnosis not present

## 2016-06-15 DIAGNOSIS — D61818 Other pancytopenia: Secondary | ICD-10-CM | POA: Diagnosis not present

## 2016-06-15 DIAGNOSIS — G8921 Chronic pain due to trauma: Secondary | ICD-10-CM | POA: Diagnosis not present

## 2016-06-15 DIAGNOSIS — E1151 Type 2 diabetes mellitus with diabetic peripheral angiopathy without gangrene: Secondary | ICD-10-CM | POA: Diagnosis not present

## 2016-06-15 DIAGNOSIS — R221 Localized swelling, mass and lump, neck: Secondary | ICD-10-CM | POA: Diagnosis present

## 2016-06-15 DIAGNOSIS — I120 Hypertensive chronic kidney disease with stage 5 chronic kidney disease or end stage renal disease: Secondary | ICD-10-CM

## 2016-06-15 DIAGNOSIS — K0889 Other specified disorders of teeth and supporting structures: Secondary | ICD-10-CM | POA: Diagnosis not present

## 2016-06-15 DIAGNOSIS — Z87891 Personal history of nicotine dependence: Secondary | ICD-10-CM

## 2016-06-15 DIAGNOSIS — H9193 Unspecified hearing loss, bilateral: Secondary | ICD-10-CM | POA: Diagnosis present

## 2016-06-15 DIAGNOSIS — R768 Other specified abnormal immunological findings in serum: Secondary | ICD-10-CM | POA: Diagnosis not present

## 2016-06-15 DIAGNOSIS — D72818 Other decreased white blood cell count: Secondary | ICD-10-CM

## 2016-06-15 DIAGNOSIS — Z992 Dependence on renal dialysis: Secondary | ICD-10-CM | POA: Diagnosis not present

## 2016-06-15 DIAGNOSIS — H748X3 Other specified disorders of middle ear and mastoid, bilateral: Secondary | ICD-10-CM

## 2016-06-15 DIAGNOSIS — I2510 Atherosclerotic heart disease of native coronary artery without angina pectoris: Secondary | ICD-10-CM | POA: Diagnosis not present

## 2016-06-15 DIAGNOSIS — R600 Localized edema: Secondary | ICD-10-CM | POA: Diagnosis not present

## 2016-06-15 DIAGNOSIS — N2581 Secondary hyperparathyroidism of renal origin: Secondary | ICD-10-CM | POA: Diagnosis not present

## 2016-06-15 DIAGNOSIS — M545 Low back pain: Secondary | ICD-10-CM

## 2016-06-15 DIAGNOSIS — Z955 Presence of coronary angioplasty implant and graft: Secondary | ICD-10-CM | POA: Diagnosis not present

## 2016-06-15 DIAGNOSIS — R609 Edema, unspecified: Secondary | ICD-10-CM | POA: Diagnosis not present

## 2016-06-15 DIAGNOSIS — Z79891 Long term (current) use of opiate analgesic: Secondary | ICD-10-CM | POA: Diagnosis not present

## 2016-06-15 DIAGNOSIS — E1122 Type 2 diabetes mellitus with diabetic chronic kidney disease: Secondary | ICD-10-CM | POA: Diagnosis not present

## 2016-06-15 DIAGNOSIS — M899 Disorder of bone, unspecified: Secondary | ICD-10-CM

## 2016-06-15 DIAGNOSIS — H61892 Other specified disorders of left external ear: Secondary | ICD-10-CM

## 2016-06-15 DIAGNOSIS — Z882 Allergy status to sulfonamides status: Secondary | ICD-10-CM

## 2016-06-15 DIAGNOSIS — M898X9 Other specified disorders of bone, unspecified site: Secondary | ICD-10-CM | POA: Diagnosis present

## 2016-06-15 DIAGNOSIS — I871 Compression of vein: Secondary | ICD-10-CM | POA: Diagnosis not present

## 2016-06-15 DIAGNOSIS — Z807 Family history of other malignant neoplasms of lymphoid, hematopoietic and related tissues: Secondary | ICD-10-CM

## 2016-06-15 DIAGNOSIS — Z86711 Personal history of pulmonary embolism: Secondary | ICD-10-CM | POA: Diagnosis not present

## 2016-06-15 DIAGNOSIS — Z888 Allergy status to other drugs, medicaments and biological substances status: Secondary | ICD-10-CM

## 2016-06-15 DIAGNOSIS — Z8249 Family history of ischemic heart disease and other diseases of the circulatory system: Secondary | ICD-10-CM

## 2016-06-15 DIAGNOSIS — Z79899 Other long term (current) drug therapy: Secondary | ICD-10-CM | POA: Diagnosis not present

## 2016-06-15 DIAGNOSIS — Z91018 Allergy to other foods: Secondary | ICD-10-CM

## 2016-06-15 LAB — RENAL FUNCTION PANEL
Albumin: 3.2 g/dL — ABNORMAL LOW (ref 3.5–5.0)
Anion gap: 13 (ref 5–15)
BUN: 41 mg/dL — ABNORMAL HIGH (ref 6–20)
CO2: 23 mmol/L (ref 22–32)
Calcium: 9.9 mg/dL (ref 8.9–10.3)
Chloride: 100 mmol/L — ABNORMAL LOW (ref 101–111)
Creatinine, Ser: 8.08 mg/dL — ABNORMAL HIGH (ref 0.61–1.24)
GFR calc Af Amer: 7 mL/min — ABNORMAL LOW
GFR calc non Af Amer: 6 mL/min — ABNORMAL LOW
Glucose, Bld: 82 mg/dL (ref 65–99)
Phosphorus: 5.4 mg/dL — ABNORMAL HIGH (ref 2.5–4.6)
Potassium: 4.6 mmol/L (ref 3.5–5.1)
Sodium: 136 mmol/L (ref 135–145)

## 2016-06-15 LAB — CBC WITH DIFFERENTIAL/PLATELET
Basophils Absolute: 0 K/uL (ref 0.0–0.1)
Basophils Relative: 1 %
Eosinophils Absolute: 0.1 K/uL (ref 0.0–0.7)
Eosinophils Relative: 3 %
HCT: 35.1 % — ABNORMAL LOW (ref 39.0–52.0)
Hemoglobin: 11.3 g/dL — ABNORMAL LOW (ref 13.0–17.0)
Lymphocytes Relative: 41 %
Lymphs Abs: 1.3 K/uL (ref 0.7–4.0)
MCH: 30.5 pg (ref 26.0–34.0)
MCHC: 32.2 g/dL (ref 30.0–36.0)
MCV: 94.6 fL (ref 78.0–100.0)
Monocytes Absolute: 0.2 K/uL (ref 0.1–1.0)
Monocytes Relative: 7 %
Neutro Abs: 1.5 K/uL — ABNORMAL LOW (ref 1.7–7.7)
Neutrophils Relative %: 48 %
Platelets: 76 K/uL — ABNORMAL LOW (ref 150–400)
RBC: 3.71 MIL/uL — ABNORMAL LOW (ref 4.22–5.81)
RDW: 15.6 % — ABNORMAL HIGH (ref 11.5–15.5)
WBC: 3.1 K/uL — ABNORMAL LOW (ref 4.0–10.5)

## 2016-06-15 LAB — RETICULOCYTES
RBC.: 3.71 MIL/uL — ABNORMAL LOW (ref 4.22–5.81)
Retic Count, Absolute: 14.8 K/uL — ABNORMAL LOW (ref 19.0–186.0)
Retic Ct Pct: 0.4 % (ref 0.4–3.1)

## 2016-06-15 LAB — HIV ANTIBODY (ROUTINE TESTING W REFLEX): HIV Screen 4th Generation wRfx: NONREACTIVE

## 2016-06-15 LAB — SAVE SMEAR

## 2016-06-15 MED ORDER — HYDRALAZINE HCL 50 MG PO TABS: 50.0000 mg | ORAL_TABLET | Freq: Three times a day (TID) | ORAL | Status: DC

## 2016-06-15 MED ORDER — HYDRALAZINE HCL 50 MG PO TABS
100.0000 mg | ORAL_TABLET | Freq: Three times a day (TID) | ORAL | Status: DC
  Administered 2016-06-15 – 2016-06-17 (×5): 100 mg via ORAL
  Filled 2016-06-15 (×6): qty 2

## 2016-06-15 MED ORDER — NEPRO/CARBSTEADY PO LIQD: 237.0000 mL | Freq: Two times a day (BID) | ORAL | Status: DC

## 2016-06-15 MED ORDER — IOPAMIDOL (ISOVUE-300) INJECTION 61%
INTRAVENOUS | Status: AC
  Administered 2016-06-15: 75 mL
  Filled 2016-06-15: qty 75

## 2016-06-15 MED ORDER — HYDRALAZINE HCL 50 MG PO TABS: 100.0000 mg | ORAL_TABLET | Freq: Three times a day (TID) | ORAL | Status: DC

## 2016-06-15 MED ORDER — DOXERCALCIFEROL 4 MCG/2ML IV SOLN
2.0000 ug | INTRAVENOUS | Status: DC
  Administered 2016-06-16: 2 ug via INTRAVENOUS

## 2016-06-15 NOTE — Evaluation (Signed)
Clinical/Bedside Swallow Evaluation Patient Details  Name: Logan Martin MRN: [id number] Date of Birth: Dec 04, 1955  Today's Date: 06/15/2016 Time: SLP Start Time (ACUTE ONLY): 0933 SLP Stop Time (ACUTE ONLY): 0947 SLP Time Calculation (min) (ACUTE ONLY): 14 min  Past Medical History:  Past Medical History:  Diagnosis Date  . Alcohol abuse    quit date in 1995  . Anemia   . Chronic low back pain   . Cocaine abuse in remission    Quit date in 1995  . 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 Started HD in [place], [place].  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  On NxStage home hemo since 2010. Using RUA AVF.    [name] ESRD on dialysis ([place])    "TTS; [place]" (06/14/2016)  . Hepatitis C   . History of blood transfusion 2007 X 1  . History of kidney stones   . Hypertension   . Peripheral vascular disease ([place])   . Pulmonary embolism [facility]) Feb 2012   Treated with coumadin x 1 year  . Renal insufficiency    Past Surgical History:  Past Surgical History:  Procedure Laterality Date  . ABDOMINAL HERNIA REPAIR  ~ 2000 X 2; 2001 X1  . AV FISTULA PLACEMENT Right 2007   upper arm  . AV FISTULA REPAIR Right ~ 03/2016 & 04/2016  . BLADDER AUGMENTATION  1975  . CORONARY ANGIOPLASTY WITH STENT PLACEMENT  1999  . EXTRACORPOREAL SHOCK WAVE LITHOTRIPSY    . HERNIA REPAIR    . INGUINAL HERNIA REPAIR Right 1975   HPI:  Ptis a 61 y.o.malewith PMH of CAD, cocaine abuse in remission, ETOH abuse, HTN, and ESRD ([facility]) who presents with 2 weeks of painful neck swelling with difficulty breathing, swallowing, and changes in voice; swelling is worst in morning and subsides at night and also improves after dialysis. He reports some pain with swallowing both liquids and solids feels like food is getting stuck in his throat. For past 2 days, also had pain and swelling in his left arm and shoulder, with diarrhea past 2 days and one episode of  nausea/vomiting 3 days ago. Had revision of his RUE fistula 4 weeks ago and also recently had mucous removed from his bladder by urology.   Assessment / Plan / Recommendation Clinical Impression  Pt states swallowing is effortful but denies strangling or coughing with liquids or solids. He demonstrates an effortful swallow with facial grimace during swallows. No indications of airway compromise throughout thin or puree (he politely declined solid cracker). Recommend continue with thin and regular texture therefore pt may choose textures that he can tolerate. No follow up needed.    Aspiration Risk  Mild aspiration risk    Diet Recommendation Regular;Thin liquid   Liquid Administration via: Cup;Straw Medication Administration: Whole meds with liquid Supervision: Patient able to self feed Compensations: Slow rate;Small sips/bites Postural Changes: Seated upright at 90 degrees    Other  Recommendations Oral Care Recommendations: Oral care BID   Follow up Recommendations None      Frequency and Duration            Prognosis        Swallow Study   General HPI: Ptis a 61 y.o.malewith PMH of CAD, cocaine abuse in remission, ETOH abuse, HTN, and ESRD ([facility]) who presents with 2 weeks of painful neck swelling with difficulty breathing, swallowing, and changes in voice; swelling is worst in morning and subsides at  night and also improves after dialysis. He reports some pain with swallowing both liquids and solids feels like food is getting stuck in his throat. For past 2 days, also had pain and swelling in his left arm and shoulder, with diarrhea past 2 days and one episode of nausea/vomiting 3 days ago. Had revision of his RUE fistula 4 weeks ago and also recently had mucous removed from his bladder by urology. Type of Study: Bedside Swallow Evaluation Previous Swallow Assessment:  (none) Diet Prior to this Study: Regular;Thin liquids Temperature Spikes Noted: No Respiratory  Status: Room air History of Recent Intubation: No Behavior/Cognition: Alert;Cooperative;Pleasant mood Oral Cavity Assessment: Within Functional Limits Oral Care Completed by SLP: No Oral Cavity - Dentition: Adequate natural dentition Vision: Functional for self-feeding Self-Feeding Abilities: Able to feed self Patient Positioning: Upright in bed Baseline Vocal Quality: Normal Volitional Cough: Strong Volitional Swallow: Able to elicit    Oral/Motor/Sensory Function Overall Oral Motor/Sensory Function: Within functional limits   Ice Chips Ice chips: Not tested   Thin Liquid Thin Liquid: Within functional limits (effortful/grimace) Presentation: Cup;Straw    Nectar Thick Nectar Thick Liquid: Not tested   Honey Thick Honey Thick Liquid: Not tested   Puree Puree: Within functional limits   Solid   GO   Solid: Not tested (politely declined)    Functional Assessment Tool Used: skilled clinial judgement Functional Limitations: Swallowing Swallow Current Status (U3845): At least 1 percent but less than 20 percent impaired, limited or restricted Swallow Goal Status [phone]): At least 1 percent but less than 20 percent impaired, limited or restricted Swallow Discharge Status [phone]): At least 1 percent but less than 20 percent impaired, limited or restricted   Houston Siren 06/15/2016,9:57 AM  Orbie Pyo Colvin Caroli.Ed [employer] [phone]

## 2016-06-15 NOTE — Progress Notes (Signed)
   Subjective: No acute events.  No difficulty breathing or swallowing.  Swelling unchanged, slept sitting up to try and let swelling drain.  Objective:  Vital signs in last 24 hours: Vitals:   06/14/16 1915 06/14/16 1930 06/14/16 2005 06/15/16 0514  BP: 182/79 186/82 (!) 186/79 (!) 176/80  Pulse:   84 86  Resp:   18 18  Temp:   97.9 F (36.6 C) 98.5 F (36.9 C)  TempSrc:   Oral   SpO2:   98% 100%  Weight:   113 lb 1.5 oz (51.3 kg)   Height:   5\' 4"  (1.626 m)    Physical Exam  Constitutional: He is oriented to person, place, and time. He appears well-developed and well-nourished. No distress.  HENT:  Stable anterior neck swelling No oropharyngeal edema  Cardiovascular: Normal rate and regular rhythm.   Pulmonary/Chest: Effort normal and breath sounds normal.  Abdominal: Soft. He exhibits no distension. There is no tenderness.  Musculoskeletal:  No LE edema  Neurological: He is alert and oriented to person, place, and time.  Psychiatric: He has a normal mood and affect. His behavior is normal.     Assessment/Plan:  Active Problems:   ESRD on dialysis (HCC)   Hepatitis C antibody test positive   Pancytopenia (HCC)   Left arm pain   Submental space infection   61 y.o. male with tender submandibular and cervical edema of uncertain etiology.  Concern for deep neck infection with potential odontogenic sources, though neither his imaging nor exam are classic for Ludwig's angina.  He has chronic pancytopenia and new bone lesions are concerning for malignancy.  His airway is not threatened and he is hemodynamically stable and nontoxic.  Will empirically treat for infection and pursue further diagnostic evaluation.  #Head and Neck Edema Considering infection, generalized edema, venous obstruction (SVC syndrome? History of venous thrombosis and vascular surgery including recent brachiocephalc V stenting).  Afebrile, chronically leukopenic, and hemodynamically stable without signs  of systemic infection.  His edema seems dependent with its circadian variation. -ENT [facility]) consulted in ED, airway widely patent -Empiric amp-sulbactam per [facility] recs -Blood cultures -CT chest w/ contrast to fully evaluate SVC  #Vertebral Lesion Sclerotic T3 lesion incidentally found on CT neck.  Renal osteodystrophy and metastatic disease are consideration. -Consider bone scan and further advanced imaging  #Pancytopenia Chronic, largely stable since at least 2014.  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 -Review smear -Consider further inpatient workup or outpatient hematology referral  #ESRD -Consult renal for HD   #HTN -Continue home amlodipine, hydralazine, labetalol  Fluids: none Diet: renal DVT Prophylaxis: SCDs (heparin reaction on chart) Code Status: full  Dispo: Anticipated discharge in approximately 1-2 day(s).   Minus Liberty, MD 06/15/2016, 6:36 AM Pager: [phone]

## 2016-06-15 NOTE — H&P (Signed)
Internal Medicine Attending Admission Note  I saw and evaluated the patient. I reviewed the resident's note and I agree with the resident's findings and plan as documented in the resident's note.  Assessment & Plan by Problem: Head and neck edema - From his clinical presentation I am most concerned for SVC syndrome.  His CT of the neck did not show any evidence of this other than the edema and some collateral flow however we did not fully evaluate the tract and we will now obtain a CT of the chest with contrast. - He has been evaluated by ENT, his imaging and clincal findings are not classic for Ludwig's angina however reasonable to continue treatment with Unsasyn while working patient up. - No airway compromise per ENT    ESRD on dialysis [facility]) - Nephology following   B/L ear effusions and Erosive changes of left external auditory canal - Could not be visualized by ENT on their exam, they recommend outpatient follow up.  Sclerotic T3 bone lesion - Outpatient bone scan  Chief Complaint(s):swelling of neck  History - key components related to admission: Briefly Logan Martin is a 61 yo male with history of ESRD on HD, CAD, HTN who presents with 2 weeks of swelling in his neck and arms associated with difficulty breathing and swallowing.  He does report he has had some fevers at home.  He has never had this issue before.  He has received much of his care in [place] and actually had a AVG revision surgery about 4 weeks ago and around this time he also had some sort of venous stent placed.  After he noticed the swelling start he did bring it up with nephrology who referred him to urgent care he was seen there 2 days PTA and prescribed amoxicillin.  He has not appreciated much improvement in his symptoms with this and developed some diarrhea thus he presented to the ED for evaluation.  In the ED a CT of the neck was obtained which revealed generalized neck edema and middle ear effusions without any  drainable collections and a new sclerotic T3 lesion in the vertebral body  Lab results: Reviewed in Epic  Physical Exam - key components related to admission: General: resting in bed HEENT: anterior neck and submandibular space tender with significant edema, no focal fluid collection palpated Cardiac: RRR, no rubs, murmurs or gallops Pulm: clear to auscultation bilaterally, moving normal volumes of air Abd: soft, nontender, nondistended, BS present Ext: warm and well perfused, trace pedal edema Neuro: alert and oriented X3, cranial nerves II-XII grossly intact   Vitals:   06/14/16 2005 06/15/16 0514 06/15/16 0912 06/15/16 1659  BP: (!) 186/79 (!) 176/80 (!) 182/82 (!) 165/72  Pulse: 84 86 79 86  Resp: 18 18 16 17   Temp: 97.9 F (36.6 C) 98.5 F (36.9 C) 98.8 F (37.1 C) 97.5 F (36.4 C)  TempSrc: Oral  Oral Oral  SpO2: 98% 100% 97% 95%  Weight: 113 lb 1.5 oz (51.3 kg)     Height: 5\' 4"  (1.626 m)

## 2016-06-15 NOTE — Consult Note (Signed)
[facility] Renal Consultation Note    Indication for Consultation:  Management of ESRD/hemodialysis; anemia, hypertension/volume and secondary hyperparathyroidism PCP: [facility]  HPI: Logan Martin is a 61 y.o. 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 Recently relocated here from [place]. PHM significant for ESRD since 1999, HTN, Hepatitis C, PVD, PE, Hx of polysubstance abuse, says he stopped using drinking/using drugs in 1995. Bilateral hearing loss, L > R. Has hearing aide.   He presented to ED with facial swelling and swelling of LUE, some difficulty breathing, difficulty swallowing. Says swelling started approximately 2 weeks ago.  Was seen by Dr. Lorrene Reid in HD clinic 06/08/2015 who had concern for SVC syndrome. Went to urgent care Monday and was started on amoxicillin without improvement. He says he called EMS because he was having more difficulty breathing.   He has been admitted for head and neck edema. CT of neck showed Generalized neck edema and effusions (including retropharyngeal effusion) without drainable fluid collection.Incidental findings of bilateral moderate pleural effusions.  ENT has been consulted. He was started on Unasyn empirically per primary.   Past Medical History:  Diagnosis Date  . Alcohol abuse    quit date in 1995  . Anemia   . Chronic low back pain   . Cocaine abuse in remission    Quit date in 1995  . 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 Started HD in [place].  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  On NxStage home hemo since 2010. Using RUA AVF.    [name] ESRD on dialysis ([place])    "TTS; [place]" (06/14/2016)  . Hepatitis C   . History of blood transfusion 2007 X 1  . History of kidney stones   . Hypertension   . Peripheral vascular disease ([place])   . Pulmonary embolism [facility]) Feb 2012    Treated with coumadin x 1 year  . Renal insufficiency    Past Surgical History:  Procedure Laterality Date  . ABDOMINAL HERNIA REPAIR  ~ 2000 X 2; 2001 X1  . AV FISTULA PLACEMENT Right 2007   upper arm  . AV FISTULA REPAIR Right ~ 03/2016 & 04/2016  . BLADDER AUGMENTATION  1975  . CORONARY ANGIOPLASTY WITH STENT PLACEMENT  1999  . EXTRACORPOREAL SHOCK WAVE LITHOTRIPSY    . HERNIA REPAIR    . INGUINAL HERNIA REPAIR Right 1975   Family History  Problem Relation Age of Onset  . Cancer Mother   . Diabetes Mother   . Hypertension Mother   . Deep vein thrombosis Sister   . Deep vein thrombosis Daughter    Social History:  reports that he quit smoking about 23 years ago. His smoking use included Cigarettes. He has a 5.00 pack-year smoking history. He has never used smokeless tobacco. He reports that he drinks alcohol. He reports that he uses drugs, including Marijuana and Cocaine. Allergies  Allergen Reactions  . Heparin Rash    Pork products   . Pork-Derived Products Rash  . Sulfa Antibiotics Rash  . Sulfonamide Derivatives Rash   Prior to Admission medications   Medication Sig Start Date End Date Taking? Authorizing Provider  amLODipine (NORVASC) 10 MG tablet Take 10 mg by mouth daily.   Yes Historical Provider, MD  calcium acetate (PHOSLO) 667 MG capsule Take 667 mg by mouth 5 (five) times daily. Takes 1 cap with  3 meals, 1 cap with 2 snacks   Yes Historical Provider, MD  hydrALAZINE (APRESOLINE) 50 MG tablet Take 50 mg by mouth 3 (three) times daily. 05/11/16  Yes Historical Provider, MD  labetalol (NORMODYNE) 200 MG tablet Take 200 mg by mouth 2 (two) times daily. 05/11/16  Yes Historical Provider, MD  multivitamin (RENA-VIT) TABS tablet Take 1 tablet by mouth at bedtime. 06/09/13  Yes Clanford Marisa Hua, MD  oxycodone (ROXICODONE) 30 MG immediate release tablet Take 30 mg by mouth every 8 (eight) hours as needed for pain.    Yes Historical Provider, MD  Oxycodone HCl 10 MG TABS  Take 10 mg by mouth 2 (two) times daily. 05/22/16  Yes Historical Provider, MD   Current Facility-Administered Medications  Medication Dose Route Frequency Provider Last Rate Last Dose  . amLODipine (NORVASC) tablet 10 mg  10 mg Oral Daily Jule Ser, DO   10 mg at 06/15/16 0936  . Ampicillin-Sulbactam (UNASYN) 3 g in sodium chloride 0.9 % 100 mL IVPB  3 g Intravenous Q24H Rebecka Apley, RPH      . calcium acetate (PHOSLO) capsule 667 mg  667 mg Oral 5 times per day Jule Ser, DO   667 mg at 06/15/16 1259  . hydrALAZINE (APRESOLINE) tablet 50 mg  50 mg Oral TID Jule Ser, DO   50 mg at 06/15/16 0936  . iopamidol (ISOVUE-300) 61 % injection           . labetalol (NORMODYNE) tablet 200 mg  200 mg Oral BID Jule Ser, DO   200 mg at 06/15/16 1540  . multivitamin (RENA-VIT) tablet 1 tablet  1 tablet Oral QHS Jule Ser, DO   1 tablet at 06/14/16 2203  . oxyCODONE (Oxy IR/ROXICODONE) immediate release tablet 10 mg  10 mg Oral BID PRN Jule Ser, DO   10 mg at 06/14/16 2203   Labs: Basic Metabolic Panel:  Recent Labs Lab 06/14/16 1235 06/15/16 0522  NA 139 136  K 4.3 4.6  CL 103 100*  CO2 22 23  GLUCOSE 75 82  BUN 30* 41*  CREATININE 7.12* 8.08*  CALCIUM 9.8 9.9  PHOS  --  5.4*   Liver Function Tests:  Recent Labs Lab 06/14/16 1718 06/15/16 0522  AST 20  --   ALT 9*  --   ALKPHOS 56  --   BILITOT 0.9  --   PROT 6.3*  --   ALBUMIN 3.2* 3.2*   No results for input(s): LIPASE, AMYLASE in the last 168 hours. No results for input(s): AMMONIA in the last 168 hours. CBC:  Recent Labs Lab 06/14/16 1235 06/15/16 0522  WBC 2.6* 3.1*  NEUTROABS 1.2* 1.5*  HGB 11.2* 11.3*  HCT 35.2* 35.1*  MCV 95.4 94.6  PLT 76* 76*   Cardiac Enzymes: No results for input(s): CKTOTAL, CKMB, CKMBINDEX, TROPONINI in the last 168 hours. CBG: No results for input(s): GLUCAP in the last 168 hours. Iron Studies: No results for input(s): IRON, TIBC, TRANSFERRIN, FERRITIN  in the last 72 hours. Studies/Results: Ct Soft Tissue Neck W Contrast  Result Date: 06/14/2016 CLINICAL DATA:  Anterior neck swelling for 1-1/2 weeks, shoulder pain. Last dialysis 2 days ago. History of hypertension, pulmonary embolism, diabetes. EXAM: CT NECK WITH CONTRAST TECHNIQUE: Multidetector CT imaging of the neck was performed using the standard protocol following the bolus administration of intravenous contrast. CONTRAST:  75 cc ISOVUE-300 IOPAMIDOL (ISOVUE-300) INJECTION 61% COMPARISON:  CT abdomen and pelvis June 01, 2016 and CT chest  November 27, 2012 FINDINGS: Pharynx and larynx: Fullness of the nasopharyngeal soft tissues, edematous pharynx. Apposition of the true vocal cords most compatible with phonation. Salivary glands: Normal. Thyroid: Heterogeneous without dominant nodule Lymph nodes: Scattered subcentimeter reniform lymph nodes without lymphadenopathy by CT size criteria. Vascular: Patent RIGHT innominate stent. High-grade stenosis or occlusion RIGHT internal jugular jugular vein proximal to the stent. Limited intracranial: Normal. Trace calcific atherosclerosis carotid siphons. Visualized orbits: Normal. Mastoids and visualized paranasal sinuses: Bilateral middle ear and mastoid effusions. Bony erosions LEFT external auditory canal. Mild paranasal sinus mucosal thickening without air-fluid levels. Skeleton: New sclerotic lesion T 3 anterior vertebral body. Severe C5-6 and C6-7 degenerative discs. Scattered dental caries and periapical lucency/abscess. Upper chest: Moderate LEFT and small to moderate RIGHT pleural effusions. Other: Generalized neck subcutaneous fat stranding and effusions without focal fluid collection. Small retropharyngeal effusion contiguous with the anterior neck. IMPRESSION: Generalized neck edema and effusions (including retropharyngeal effusion) without drainable fluid collection. Bilateral middle ear and mastoid effusions, likely postobstructive from hypertrophic  nasopharyngeal soft tissues associated with recent viral illness and immunocompromised states. Additional erosive changes LEFT external auditory canal concerning for malignant otitis media though neoplasm is a consideration. Recommend direct inspection. Increasing moderate LEFT and mild RIGHT pleural effusions. New sclerotic lesion T3 vertebral body, recommend bone scan. No pathologic fracture. Electronically Signed   By: Elon Alas M.D.   On: 06/14/2016 15:35    ROS: As per HPI otherwise negative.   Physical Exam: Vitals:   06/14/16 1930 06/14/16 2005 06/15/16 0514 06/15/16 0912  BP: 186/82 (!) 186/79 (!) 176/80 (!) 182/82  Pulse:  84 86 79  Resp:  18 18 16   Temp:  97.9 F (36.6 C) 98.5 F (36.9 C) 98.8 F (37.1 C)  TempSrc:  Oral  Oral  SpO2:  98% 100% 97%  Weight:  51.3 kg (113 lb 1.5 oz)    Height:  5\' 4"  (1.626 m)       General: Well developed, well nourished, in no acute distress. Head: Normocephalic, atraumatic, sclera non-icteric, mucus membranes are moist Neck: Supple. JVD not elevated. Lungs: Clear bilaterally to auscultation without wheezes, rales, or rhonchi. Breathing is unlabored. Heart: RRR with S1 S2. No murmurs, rubs, or gallops appreciated. Abdomen: Soft, non-tender, non-distended with normoactive bowel sounds. No rebound/guarding. No obvious abdominal masses. M-S:  Strength and tone appear normal for age. Lower extremities:without edema or ischemic changes, no open wounds  Neuro: Alert and oriented X 3. Moves all extremities spontaneously. Psych:  Responds to questions appropriately with a normal affect. Dialysis Access:  Dialysis Orders: [facility] T,Th,S 4 hours 450/Auto 1.5 49.5 kg 2.0 K/ 2.25 Ca  Hectoral 3 mcg IV (Last Ca 9.7 Phos 8.4 PTH 236 06/09/16) Venofer 50 mg IV Q week (Last HGB 11.5 06/09/16)   Assessment/Plan: 1.  Submental Edema: Per primary. Concern for SVC syndrome. ENT has been consulted. On Unasyn. Consider work up for SVC  syndrome. 2.  ESRD -  T,Th,S HD tomorrow on schedule. 2.0 K bath 3.  Hypertension/volume  - BP elevated. Amlodipine 10 mg and hydralazine restarted, Called OP pharmacy to confirm doses. Hydralazine is supposed to be 100 mg PO TID. Will adjust. Missed HD 06/14/16. No evidence of volume overload by exam today. Will have HD on schedule tomorrow. Wt 51.3 kg. UFG to OP EDW 49.5 kg 2-2.5 liters.  4.  Anemia  -HGB 11.3 No ESA needed.  5.  Metabolic bone disease -  Phos 5.4 Ca 9.9 C Ca 10.5 Reduce  hectoral to 2 mcg IV. May need to change to non-calcium binders.  6.  Nutrition - Albumin 3.2. Requesting Regular diet. Will continue Renal diet/renal vit/nepro.   Christena Sunderlin H. Owens Shark, NP-C 06/15/2016, 2:13 PM  [employer] [phone]

## 2016-06-16 DIAGNOSIS — R609 Edema, unspecified: Secondary | ICD-10-CM

## 2016-06-16 LAB — CBC
HCT: 30.1 % — ABNORMAL LOW (ref 39.0–52.0)
HCT: 30.5 % — ABNORMAL LOW (ref 39.0–52.0)
Hemoglobin: 9.7 g/dL — ABNORMAL LOW (ref 13.0–17.0)
Hemoglobin: 10 g/dL — ABNORMAL LOW (ref 13.0–17.0)
MCH: 30.2 pg (ref 26.0–34.0)
MCH: 30.6 pg (ref 26.0–34.0)
MCHC: 32.2 g/dL (ref 30.0–36.0)
MCHC: 32.8 g/dL (ref 30.0–36.0)
MCV: 93.8 fL (ref 78.0–100.0)
MCV: 93.3 fL (ref 78.0–100.0)
PLATELETS: 81 10*3/uL — AB (ref 150–400)
Platelets: 86 10*3/uL — ABNORMAL LOW (ref 150–400)
RBC: 3.21 MIL/uL — ABNORMAL LOW (ref 4.22–5.81)
RBC: 3.27 MIL/uL — AB (ref 4.22–5.81)
RDW: 15.6 % — ABNORMAL HIGH (ref 11.5–15.5)
RDW: 15.6 % — ABNORMAL HIGH (ref 11.5–15.5)
WBC: 3.1 K/uL — ABNORMAL LOW (ref 4.0–10.5)
WBC: 3.1 10*3/uL — ABNORMAL LOW (ref 4.0–10.5)

## 2016-06-16 LAB — RENAL FUNCTION PANEL
ALBUMIN: 2.9 g/dL — AB (ref 3.5–5.0)
ANION GAP: 13 (ref 5–15)
Albumin: 2.9 g/dL — ABNORMAL LOW (ref 3.5–5.0)
Anion gap: 13 (ref 5–15)
BUN: 59 mg/dL — ABNORMAL HIGH (ref 6–20)
BUN: 59 mg/dL — ABNORMAL HIGH (ref 6–20)
CALCIUM: 9.6 mg/dL (ref 8.9–10.3)
CO2: 22 mmol/L (ref 22–32)
CO2: 23 mmol/L (ref 22–32)
CREATININE: 9.31 mg/dL — AB (ref 0.61–1.24)
Calcium: 9.6 mg/dL (ref 8.9–10.3)
Chloride: 101 mmol/L (ref 101–111)
Chloride: 101 mmol/L (ref 101–111)
Creatinine, Ser: 9.33 mg/dL — ABNORMAL HIGH (ref 0.61–1.24)
GFR calc Af Amer: 6 mL/min — ABNORMAL LOW (ref >60)
GFR calc non Af Amer: 5 mL/min — ABNORMAL LOW (ref >60)
GFR, EST AFRICAN AMERICAN: 6 mL/min — AB (ref >60)
GFR, EST NON AFRICAN AMERICAN: 5 mL/min — AB (ref >60)
Glucose, Bld: 107 mg/dL — ABNORMAL HIGH (ref 65–99)
Glucose, Bld: 109 mg/dL — ABNORMAL HIGH (ref 65–99)
Phosphorus: 5.1 mg/dL — ABNORMAL HIGH (ref 2.5–4.6)
Phosphorus: 5.1 mg/dL — ABNORMAL HIGH (ref 2.5–4.6)
Potassium: 4.4 mmol/L (ref 3.5–5.1)
Potassium: 4.4 mmol/L (ref 3.5–5.1)
SODIUM: 136 mmol/L (ref 135–145)
Sodium: 137 mmol/L (ref 135–145)

## 2016-06-16 LAB — PSA: PSA: 0.41 ng/mL (ref 0.00–4.00)

## 2016-06-16 MED ORDER — DARBEPOETIN ALFA 60 MCG/0.3ML IJ SOSY
PREFILLED_SYRINGE | INTRAMUSCULAR | Status: AC
  Filled 2016-06-16: qty 0.3

## 2016-06-16 MED ORDER — DOXERCALCIFEROL 4 MCG/2ML IV SOLN
INTRAVENOUS | Status: AC
  Administered 2016-06-16: 2 ug via INTRAVENOUS
  Filled 2016-06-16: qty 2

## 2016-06-16 MED ORDER — SODIUM CHLORIDE 0.9 % IV SOLN: 100.0000 mL | INTRAVENOUS | Status: DC | PRN

## 2016-06-16 MED ORDER — LIDOCAINE-PRILOCAINE 2.5-2.5 % EX CREA: 1.0000 "application " | TOPICAL_CREAM | CUTANEOUS | Status: DC | PRN

## 2016-06-16 MED ORDER — CALCITRIOL 0.25 MCG PO CAPS
ORAL_CAPSULE | ORAL | Status: AC
  Filled 2016-06-16: qty 1

## 2016-06-16 MED ORDER — ONDANSETRON HCL 4 MG/2ML IJ SOLN
4.0000 mg | Freq: Once | INTRAMUSCULAR | Status: AC
  Administered 2016-06-16: 4 mg via INTRAVENOUS
  Filled 2016-06-16: qty 2

## 2016-06-16 MED ORDER — LOPERAMIDE HCL 2 MG PO CAPS
2.0000 mg | ORAL_CAPSULE | ORAL | Status: DC | PRN
  Administered 2016-06-16: 2 mg via ORAL
  Filled 2016-06-16: qty 1

## 2016-06-16 MED ORDER — ALTEPLASE 2 MG IJ SOLR: 2.0000 mg | Freq: Once | INTRAMUSCULAR | Status: DC | PRN

## 2016-06-16 MED ORDER — LIDOCAINE HCL (PF) 1 % IJ SOLN: 5.0000 mL | INTRAMUSCULAR | Status: DC | PRN

## 2016-06-16 MED ORDER — PENTAFLUOROPROP-TETRAFLUOROETH EX AERO: 1.0000 "application " | INHALATION_SPRAY | CUTANEOUS | Status: DC | PRN

## 2016-06-16 NOTE — Progress Notes (Addendum)
   Subjective: SOB reported last PM after CT scan.  This has resolved.  Seen this AM while getting HD.  Reports swelling is better, he is feeling fatigued.  Otherwise, no acute complaints.   Objective:  Vital signs in last 24 hours: Vitals:   06/16/16 0713 06/16/16 0745 06/16/16 0800 06/16/16 0830  BP: (!) 180/88 (!) 176/76 (!) 169/85 (!) 148/71  Pulse: 84 87 90 90  Resp:      Temp:      TempSrc:      SpO2:      Weight:      Height:       Physical Exam  Constitutional: He is oriented to person, place, and time. He appears well-developed and well-nourished. No distress.  Sitting up in bed comfortably watching TV.  HENT:  Mouth/Throat: No oropharyngeal exudate.  Stable anterior neck swelling  Eyes: Conjunctivae and EOM are normal.  Cardiovascular: Normal rate and regular rhythm.   Pulmonary/Chest: Effort normal and breath sounds normal.  Abdominal: Soft. He exhibits no distension. There is no tenderness.  Musculoskeletal: He exhibits no edema.  Neurological: He is alert and oriented to person, place, and time.  Psychiatric: He has a normal mood and affect. His behavior is normal.     Assessment/Plan:  Principal Problem:   SVC (superior vena cava obstruction) Active Problems:   ESRD on dialysis (HCC)   Hepatitis C antibody test positive   Pancytopenia (HCC)   Left arm pain   61 y.o. male with tender submandibular and cervical edema.  Concern for SVC, deep neck infection with potential odontogenic sources, though neither his imaging nor exam are classic for Ludwig's angina.  He has chronic pancytopenia and new bone lesions are concerning for malignancy.  His airway is not threatened and he is hemodynamically stable and nontoxic.   #Head and Neck Edema CT chest last night reviewed and showed "interim placement of right subclavian vein vascular stent; there is suspected moderate severe stenosis of the SVC at the brachiocephalic venous confluence, just distal to the  stent." Symptoms at this point appear most consistent with SVC considering findings on chest and neck CT -Discontinue empiric Unasyn, continue to follow blood cultures.  However, infection seems less likely at this point. -Discuss with VVS for potential venogram and/or intervention.  #Vertebral Lesion Sclerotic T3 lesion incidentally found on CT scan.  Renal osteodystrophy and metastatic disease are consideration. -PSA per nephrology -Consider bone scan and further advanced imaging  #Pancytopenia Chronic, largely stable since at least 2014.  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 -Review smear -Consider further inpatient workup or outpatient hematology referral  #ESRD -Appreciate renal recs for HD   #HTN -Continue home amlodipine, hydralazine, labetalol  Fluids: none Diet: renal DVT Prophylaxis: SCDs (heparin reaction on chart) Code Status: full  Dispo: Anticipated discharge in approximately 1-2 day(s).   Jule Ser, DO 06/16/2016, 8:47 AM Pager: [phone]

## 2016-06-16 NOTE — Consult Note (Signed)
Referring Physician: Dr Juleen China  Patient name: Logan Martin MRN: [id number] DOB: [birth year] Sex: male  REASON FOR CONSULT: rule out SVC syndrome  HPI: DAELAN GATT is a 61 y.o. male long term HD pt with recent right innominate SVC stent in [place].  He recently developed facial swelling and dyspnea which is now improved. He also complains of several weeks of left arm swelling but no real swelling in the right arm or problems with his right arm AV fistula.  He also complains of some left side chest and should pain.  Other medical problems include ESRD T Th Sat dialysis, hep C both of which are stable.  Past Medical History:  Diagnosis Date  . Alcohol abuse    quit date in 1995  . Anemia   . Chronic low back pain   . Cocaine abuse in remission    Quit date in 1995  . 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 Started HD in [place], [place].  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  On NxStage home hemo since 2010. Using RUA AVF.    [name] ESRD on dialysis ([place])    "TTS; [place]" (06/14/2016)  . Hepatitis C   . History of blood transfusion 2007 X 1  . History of kidney stones   . Hypertension   . Peripheral vascular disease ([place])   . Pulmonary embolism [facility]) Feb 2012   Treated with coumadin x 1 year  . Renal insufficiency    Past Surgical History:  Procedure Laterality Date  . ABDOMINAL HERNIA REPAIR  ~ 2000 X 2; 2001 X1  . AV FISTULA PLACEMENT Right 2007   upper arm  . AV FISTULA REPAIR Right ~ 03/2016 & 04/2016  . BLADDER AUGMENTATION  1975  . CORONARY ANGIOPLASTY WITH STENT PLACEMENT  1999  . EXTRACORPOREAL SHOCK WAVE LITHOTRIPSY    . HERNIA REPAIR    . INGUINAL HERNIA REPAIR Right 1975    Family History  Problem Relation Age of Onset  . Cancer Mother   . Diabetes Mother   . Hypertension Mother   . Deep vein thrombosis Sister   . Deep vein thrombosis Daughter     SOCIAL HISTORY: Social History   Social History  .  Marital status: Single    Spouse name: N/A  . Number of children: N/A  . Years of education: N/A   Occupational History  . Not on file.   Social History Main Topics  . Smoking status: Former Smoker    Packs/day: 1.00    Years: 5.00    Types: Cigarettes    Quit date: 05/28/1993  . Smokeless tobacco: Never Used  . Alcohol use Yes     Comment: 06/14/2016 "nothing since 1995"  . Drug use: Yes    Types: Marijuana, Cocaine     Comment: 06/14/2016 "nothing since 1995"  . Sexual activity: Yes   Other Topics Concern  . Not on file   Social History Narrative  . No narrative on file    Allergies  Allergen Reactions  . Heparin Rash    Pork products   . Pork-Derived Products Rash  . Sulfa Antibiotics Rash  . Sulfonamide Derivatives Rash    Current Facility-Administered Medications  Medication Dose Route Frequency Provider Last Rate Last Dose  . amLODipine (NORVASC) tablet 10 mg  10 mg Oral Daily Jule Ser, DO   10 mg at 06/16/16 1207  . calcitRIOL (ROCALTROL) 0.25 MCG capsule           .  calcium acetate (PHOSLO) capsule 667 mg  667 mg Oral 5 times per day Jule Ser, DO   667 mg at 06/16/16 1208  . Darbepoetin Alfa (ARANESP) 60 MCG/0.3ML injection           . doxercalciferol (HECTOROL) injection 2 mcg  2 mcg Intravenous Q T,Th,Sa-HD Valentina Gu, NP   2 mcg at 06/16/16 0759  . feeding supplement (NEPRO CARB STEADY) liquid 237 mL  237 mL Oral BID BM Valentina Gu, NP      . hydrALAZINE (APRESOLINE) tablet 100 mg  100 mg Oral TID Valentina Gu, NP   100 mg at 06/15/16 2321  . labetalol (NORMODYNE) tablet 200 mg  200 mg Oral BID Jule Ser, DO   200 mg at 06/16/16 1207  . multivitamin (RENA-VIT) tablet 1 tablet  1 tablet Oral QHS Jule Ser, DO   1 tablet at 06/15/16 2321  . oxyCODONE (Oxy IR/ROXICODONE) immediate release tablet 10 mg  10 mg Oral BID PRN Jule Ser, DO   10 mg at 06/14/16 2203    ROS:   General:  No weight loss, Fever,  chills  HEENT: No recent headaches, no nasal bleeding, no visual changes, no sore throat  Neurologic: No dizziness, blackouts, seizures. No recent symptoms of stroke or mini- stroke. No recent episodes of slurred speech, or temporary blindness.  Cardiac: No recent episodes of chest pain/pressure, no shortness of breath at rest.  No shortness of breath with exertion.  Denies history of atrial fibrillation or irregular heartbeat  Vascular: No history of rest pain in feet.  No history of claudication.  No history of non-healing ulcer, No history of DVT   Pulmonary: No home oxygen, no productive cough, no hemoptysis,  No asthma or wheezing  Musculoskeletal:  [ ]  Arthritis, [ ]  Low back pain,  [ ]  Joint pain  Hematologic:No history of hypercoagulable state.  No history of easy bleeding.  No history of anemia  Gastrointestinal: No hematochezia or melena,  No gastroesophageal reflux, no trouble swallowing  Urinary: [X]  chronic Kidney disease, [X]  on HD - [ ]  MWF or [X]  TTHS, [ ]  Burning with urination, [ ]  Frequent urination, [ ]  Difficulty urinating;   Skin: No rashes  Psychological: No history of anxiety,  No history of depression   Physical Examination  Vitals:   06/16/16 1000 06/16/16 1030 06/16/16 1108 06/16/16 1154  BP: 131/65 (!) 151/79 (!) 143/75 (!) 170/60  Pulse: 82 87 76 76  Resp:   (!) 22 20  Temp:  97.5 F (36.4 C) 97.5 F (36.4 C) 97.6 F (36.4 C)  TempSrc:  Oral Oral Oral  SpO2:   96% 98%  Weight:   112 lb 7 oz (51 kg)   Height:        Body mass index is 19.3 kg/m.  General:  Alert and oriented, no acute distress HEENT: Normal, no periorbital edema Neck: No bruit or JVD mild submental swelling but overall soft Pulmonary: Clear to auscultation bilaterally, a few right side chest wall collateral veins Cardiac: Regular Rate and Rhythm  Abdomen: Soft, non-tender, non-distended Skin: No rash Extremity Pulses:  2+ radial, brachial + thrill right arm  AVF Musculoskeletal: No deformity left forearm edema  Neurologic: Upper and lower extremity motor 5/5 and symmetric  DATA:  CBC    Component Value Date/Time   WBC 3.1 (L) 06/16/2016 0753   RBC 3.27 (L) 06/16/2016 0753   HGB 10.0 (L) 06/16/2016 0753   HCT 30.5 (L)  06/16/2016 0753   PLT 81 (L) 06/16/2016 0753   MCV 93.3 06/16/2016 0753   MCH 30.6 06/16/2016 0753   MCHC 32.8 06/16/2016 0753   RDW 15.6 (H) 06/16/2016 0753   LYMPHSABS 1.3 06/15/2016 0522   MONOABS 0.2 06/15/2016 0522   EOSABS 0.1 06/15/2016 0522   BASOSABS 0.0 06/15/2016 0522    BMET    Component Value Date/Time   NA 136 06/16/2016 0358   NA 137 06/16/2016 0358   K 4.4 06/16/2016 0358   K 4.4 06/16/2016 0358   CL 101 06/16/2016 0358   CL 101 06/16/2016 0358   CO2 22 06/16/2016 0358   CO2 23 06/16/2016 0358   GLUCOSE 109 (H) 06/16/2016 0358   GLUCOSE 107 (H) 06/16/2016 0358   BUN 59 (H) 06/16/2016 0358   BUN 59 (H) 06/16/2016 0358   CREATININE 9.31 (H) 06/16/2016 0358   CREATININE 9.33 (H) 06/16/2016 0358   CALCIUM 9.6 06/16/2016 0358   CALCIUM 9.6 06/16/2016 0358   GFRNONAA 5 (L) 06/16/2016 0358   GFRNONAA 5 (L) 06/16/2016 0358   GFRAA 6 (L) 06/16/2016 0358   GFRAA 6 (L) 06/16/2016 0358   CT chest right subclavian innominate SVC stent with narrowed distal SVC at atrial junction  ASSESSMENT:  Pt with some nuisance submental swelling no obvious airway compromise or stridor.  No significant facial edema.  He does have evidence of superior vena cava narrowing on chest CT and with some of his clincal symptoms but usually these symptoms are self limited and though aggravating not life threatening.  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  Doubt his chest pain is secondary to this.  PLAN:  Bilateral central venogram with possible PTA or stent of SVC 06/22/16.  If pt symptoms are tolerable he can be discharged and brought back for procedure.   Ruta Hinds, MD [facility] Office: [phone] Pager: [phone]

## 2016-06-16 NOTE — Procedures (Signed)
I have seen and examined this patient and agree with the plan of care   BP 130/70  Hb 9.7  Ca 9.6  Phos 5.1   Alb 2.9     Patent RIGHT innominate stent. High-grade stenosis or occlusion RIGHT internal jugular jugular vein proximal to the stent. T3 vertebral body  Sclerotic lesion   Will check PSA        [facility] W 06/16/2016, 7:27 AM

## 2016-06-16 NOTE — Progress Notes (Signed)
Pt called this nurse to room, stating he was SOB and c/o nausea. This nurse applied oxygen and paged the MD on call. MD came and saw pt. Pt stated he was feeling better with the oxygen and he wanted some ginger ale to sip on with some crackers. MD gave order to do EKG and to continue to monitor. This nurse just rechecked on pt and he was asleep. Will continue to monitor.   Eleanora Neighbor, RN

## 2016-06-16 NOTE — Progress Notes (Signed)
Paged MD regarding high BP of 188/81.   Eleanora Neighbor, RN

## 2016-06-16 NOTE — Progress Notes (Signed)
  Date: 06/16/2016  Patient name: Logan Martin  Medical record number: [id number]  Date of birth: [birth year]   This patient's plan of care was discussed with the house staff. Please see their note for complete details. I concur with their findings.   Sid Falcon, MD 06/16/2016, 5:09 PM

## 2016-06-16 NOTE — Progress Notes (Signed)
IMTS Cross Cover Progress Note  [name] is a 61yo male admitted for head and neck edema concerning for SVC syndrome. Patient was evaluated at bedside for complaint of shortness of breath. Patient underwent CT Chest earlier in the evening and states that since then he has felt short of breath but did not alert nursing to this until asked.    Per notes, patient has been mostly upright to allow for swelling to secede though patient states he has been lying down flat without symptoms.   He states that since the [place] was placed, he has felt some resolution to his shortness of breath. He had mild chest tightness briefly immediately after CT, but this has resolved. He states that he feels his swelling is actually improving from initial presentation on the 18th.  He states that in the past he has had nausea and vomiting after IV contrast and is currently having some nausea, but no abdominal pain. He endorses continued diarrhea but has noticed stool was darker the last couple of times compared to previous. Denies hematochezia. He was able to eat his dinner without problems with swallowing or nausea at 6pm.  Physical Exam: Constitutional: NAD, sitting up, not tripoding or extension of head HEENT: oropharynx non erythematous and without exudates, anterior neck edema, submandibular edema associated with tenderness. Handling oral secretions appropriately. CV: RRR, systolic ejection murmur, no rubs or gallops, pulses intact, minimal LE edema, LUE edema with bilateral radial pulses intact Resp: No increased work of breathing, decreased breath sounds in lower lung regions bilaterally, otherwise clear breath sounds elsewhere Ext: warm, dry  EKG: SR, low voltage consistent with prior, new upright T wave in V6 only from prior, otherwise no acute ST or T wave changes. QTc improved to 456  Assessment & Plan: Patient with head and neck swelling concerning for SVC syndrome with complaints of stable shortness of breath  since CT scan and nausea. Exam was reassuring as patient was comfortably breathing and conversing in full sentences, there was no worsening edema and he was handling secretions without problems. He does not appear to be in acute respiratory distress at this time. Looking over his CT scan personally, he appears to have significant bilateral pleural effusions with extension into fissures. He also has not had HD since 1/16 which could both be contributing.  --ginger ale for nausea; phenergan if ineffective --titrate off of O2 - was at 100% on 2L; keep monitoring for signs of worsening edema and development of respiratory distress --f/u formal CT read --Nephro planning for HD later today --possible IR consult in AM for thoracentesis of pleural effusions with fluid studies and cytology  Alphonzo Grieve, MD IMTS - PGY1 Pager [phone]

## 2016-06-17 DIAGNOSIS — Z992 Dependence on renal dialysis: Secondary | ICD-10-CM

## 2016-06-17 DIAGNOSIS — R768 Other specified abnormal immunological findings in serum: Secondary | ICD-10-CM

## 2016-06-17 DIAGNOSIS — N186 End stage renal disease: Secondary | ICD-10-CM

## 2016-06-17 DIAGNOSIS — I871 Compression of vein: Principal | ICD-10-CM

## 2016-06-17 LAB — RENAL FUNCTION PANEL
Albumin: 3 g/dL — ABNORMAL LOW (ref 3.5–5.0)
Albumin: 3 g/dL — ABNORMAL LOW (ref 3.5–5.0)
Anion gap: 11 (ref 5–15)
Anion gap: 11 (ref 5–15)
BUN: 37 mg/dL — ABNORMAL HIGH (ref 6–20)
BUN: 38 mg/dL — ABNORMAL HIGH (ref 6–20)
CO2: 27 mmol/L (ref 22–32)
CO2: 28 mmol/L (ref 22–32)
Calcium: 9.2 mg/dL (ref 8.9–10.3)
Calcium: 9.4 mg/dL (ref 8.9–10.3)
Chloride: 99 mmol/L — ABNORMAL LOW (ref 101–111)
Chloride: 99 mmol/L — ABNORMAL LOW (ref 101–111)
Creatinine, Ser: 5.91 mg/dL — ABNORMAL HIGH (ref 0.61–1.24)
Creatinine, Ser: 5.92 mg/dL — ABNORMAL HIGH (ref 0.61–1.24)
GFR calc Af Amer: 11 mL/min — ABNORMAL LOW
GFR calc Af Amer: 11 mL/min — ABNORMAL LOW (ref >60)
GFR calc non Af Amer: 9 mL/min — ABNORMAL LOW
GFR calc non Af Amer: 9 mL/min — ABNORMAL LOW (ref >60)
Glucose, Bld: 82 mg/dL (ref 65–99)
Glucose, Bld: 83 mg/dL (ref 65–99)
Phosphorus: 4.6 mg/dL (ref 2.5–4.6)
Phosphorus: 4.7 mg/dL — ABNORMAL HIGH (ref 2.5–4.6)
Potassium: 4 mmol/L (ref 3.5–5.1)
Potassium: 4 mmol/L (ref 3.5–5.1)
Sodium: 137 mmol/L (ref 135–145)
Sodium: 138 mmol/L (ref 135–145)

## 2016-06-17 LAB — CBC
HCT: 29.3 % — ABNORMAL LOW (ref 39.0–52.0)
Hemoglobin: 9.6 g/dL — ABNORMAL LOW (ref 13.0–17.0)
MCH: 30.7 pg (ref 26.0–34.0)
MCHC: 32.8 g/dL (ref 30.0–36.0)
MCV: 93.6 fL (ref 78.0–100.0)
PLATELETS: 92 10*3/uL — AB (ref 150–400)
RBC: 3.13 MIL/uL — ABNORMAL LOW (ref 4.22–5.81)
RDW: 15.5 % (ref 11.5–15.5)
WBC: 3.7 10*3/uL — AB (ref 4.0–10.5)

## 2016-06-17 LAB — MRSA PCR SCREENING: MRSA by PCR: NEGATIVE

## 2016-06-17 LAB — HEPATITIS B SURFACE ANTIBODY,QUALITATIVE: HEP B S AB: REACTIVE

## 2016-06-17 LAB — HEPATITIS B SURFACE ANTIGEN: HEP B S AG: NEGATIVE

## 2016-06-17 NOTE — Progress Notes (Signed)
Discharge instructions and medications discussed with patient.  All questions answered.  

## 2016-06-17 NOTE — Progress Notes (Signed)
[facility] ROUNDING NOTE   Subjective:   Interval History:  Appears stable for discharge and outpatient management of SVC syndrome  Objective:  Vital signs in last 24 hours:  Temp:  [97.5 F (36.4 C)-98.4 F (36.9 C)] 97.9 F (36.6 C) (01/21 0953) Pulse Rate:  [76-92] 92 (01/21 0953) Resp:  [20-22] 20 (01/21 0953) BP: (140-170)/(51-89) 140/89 (01/21 0953) SpO2:  [96 %-98 %] 96 % (01/21 0953) Weight:  [50.4 kg (111 lb 1.8 oz)-51 kg (112 lb 7 oz)] 50.4 kg (111 lb 1.8 oz) (01/21 0254)  Weight change: -0.001 kg (-0 oz) Filed Weights   06/16/16 1108 06/16/16 2137 06/17/16 0254  Weight: 51 kg (112 lb 7 oz) 50.4 kg (111 lb 3.2 oz) 50.4 kg (111 lb 1.8 oz)    Intake/Output: I/O last 3 completed shifts: In: 600 [P.O.:600] Out: 2400 [Other:2400]   Intake/Output this shift:  Total I/O In: 240 [P.O.:240] Out: 0   CVS- RRR RS- CTA  Stable anterior neck swelling  ABD- BS present soft non-distended EXT- no edema   Basic Metabolic Panel:  Recent Labs Lab 06/14/16 1235 06/15/16 0522 06/16/16 0358 06/17/16 0356  NA 139 136 136  137 137  138  K 4.3 4.6 4.4  4.4 4.0  4.0  CL 103 100* 101  101 99*  99*  CO2 22 23 22  23 27  28   GLUCOSE 75 82 109*  107* 82  83  BUN 30* 41* 59*  59* 38*  37*  CREATININE 7.12* 8.08* 9.31*  9.33* 5.92*  5.91*  CALCIUM 9.8 9.9 9.6  9.6 9.2  9.4  PHOS  --  5.4* 5.1*  5.1* 4.7*  4.6    Liver Function Tests:  Recent Labs Lab 06/14/16 1718 06/15/16 0522 06/16/16 0358 06/17/16 0356  AST 20  --   --   --   ALT 9*  --   --   --   ALKPHOS 56  --   --   --   BILITOT 0.9  --   --   --   PROT 6.3*  --   --   --   ALBUMIN 3.2* 3.2* 2.9*  2.9* 3.0*  3.0*   No results for input(s): LIPASE, AMYLASE in the last 168 hours. No results for input(s): AMMONIA in the last 168 hours.  CBC:  Recent Labs Lab 06/14/16 1235 06/15/16 0522 06/16/16 0358 06/16/16 0753 06/17/16 0356  WBC 2.6* 3.1* 3.1* 3.1* 3.7*   NEUTROABS 1.2* 1.5*  --   --   --   HGB 11.2* 11.3* 9.7* 10.0* 9.6*  HCT 35.2* 35.1* 30.1* 30.5* 29.3*  MCV 95.4 94.6 93.8 93.3 93.6  PLT 76* 76* 86* 81* 92*    Cardiac Enzymes: No results for input(s): CKTOTAL, CKMB, CKMBINDEX, TROPONINI in the last 168 hours.  BNP: Invalid input(s): POCBNP  CBG: No results for input(s): GLUCAP in the last 168 hours.  Microbiology: Results for orders placed or performed during the hospital encounter of 06/14/16  Culture, blood (Routine X 2) w Reflex to ID Panel     Status: None (Preliminary result)   Collection Time: 06/15/16 12:12 AM  Result Value Ref Range Status   Specimen Description BLOOD RIGHT HAND  Final   Special Requests IN PEDIATRIC BOTTLE 3ML  Final   Culture NO GROWTH 1 DAY  Final   Report Status PENDING  Incomplete  Culture, blood (Routine X 2) w Reflex to ID Panel     Status: None (Preliminary  result)   Collection Time: 06/15/16  5:24 AM  Result Value Ref Range Status   Specimen Description BLOOD RIGHT HAND  Final   Special Requests BOTTLES DRAWN AEROBIC ONLY 5CC  Final   Culture NO GROWTH 1 DAY  Final   Report Status PENDING  Incomplete  MRSA PCR Screening     Status: None   Collection Time: 06/16/16  8:21 PM  Result Value Ref Range Status   MRSA by PCR NEGATIVE NEGATIVE Final    Comment:        The GeneXpert MRSA Assay (FDA approved for NASAL specimens only), is one component of a comprehensive MRSA colonization surveillance program. It is not intended to diagnose MRSA infection nor to guide or monitor treatment for MRSA infections.     Coagulation Studies: No results for input(s): LABPROT, INR in the last 72 hours.  Urinalysis: No results for input(s): COLORURINE, LABSPEC, PHURINE, GLUCOSEU, HGBUR, BILIRUBINUR, KETONESUR, PROTEINUR, UROBILINOGEN, NITRITE, LEUKOCYTESUR in the last 72 hours.  Invalid input(s): APPERANCEUR    Imaging: Ct Chest W Contrast  Result Date: 06/16/2016 CLINICAL DATA:  Submental  space infection, generalized upper body edema EXAM: CT CHEST WITH CONTRAST TECHNIQUE: Multidetector CT imaging of the chest was performed during intravenous contrast administration. CONTRAST:  75 mL Isovue 300 intravenous COMPARISON:  06/14/2016, CT chest 11/27/2012 FINDINGS: Cardiovascular: Mildly ectatic ascending aorta measuring 3.5 cm. No dissection is seen. Mild coronary artery calcifications. Mild cardiomegaly. Small pericardial effusion. Numerous collateral vessels are again visualized over the left chest wall and at the base of the left neck. Interim finding of multiple small mediastinal collateral vessels. Suspected moderate severe stenosis of the SVC at the brachiocephalic confluence. There is contrast filling of the lower SVC by azygos vein. Moderate narrowing of the distal SVC. Mediastinum/Nodes: Trachea is midline. Thyroid normal. Esophagus grossly unremarkable. Lungs/Pleura: Moderate bilateral pleural effusions. Partial consolidation or pneumonia within the bilateral lower lobes. No pneumothorax. Stable small scattered nodular densities within the bilateral lungs. No new suspicious nodules. Mild emphysematous disease Upper Abdomen: Multiple cysts within the liver. Partially visualized polycystic kidneys. Enlarged pancreatic duct. Musculoskeletal: Sclerotic lesion in T3. No acute osseous abnormality. Generalized edema within the body wall. IMPRESSION: 1. Multiple collateral vessels of the left chest wall, left paraspinous area and base of neck. Now seen are multiple collateral vessels within the mediastinum. Interim placement of right subclavian vein vascular stent ; there is suspected moderate severe stenosis of the SVC at the brachiocephalic venous confluence, just distal to the stent. There is mild narrowing of the distal SVC. 2. Moderate pleural effusions with atelectasis or infiltrate in the lower lobes. 3. Anasarca and 4. Polycystic kidneys and multiple liver cysts. New pancreatic duct dilatation.  Electronically Signed   By: Donavan Foil M.D.   On: 06/16/2016 04:12     Medications:    . amLODipine  10 mg Oral Daily  . calcium acetate  667 mg Oral 5 times per day  . doxercalciferol  2 mcg Intravenous Q T,Th,Sa-HD  . feeding supplement (NEPRO CARB STEADY)  237 mL Oral BID BM  . hydrALAZINE  100 mg Oral TID  . labetalol  200 mg Oral BID  . multivitamin  1 tablet Oral QHS   loperamide, oxyCODONE  Assessment/ Plan:  60 y.o.malewith tender submandibular and cervical edema. Concern for SVC, deep neck infection with potential odontogenic sources, though neither his imaging nor exam are classic for Ludwig's angina. He has chronic pancytopenia and new bone lesions are concerning for malignancy. His  airway is not threatened and he is hemodynamically stable and nontoxic.   Symptoms at this point appear most consistent with SVC considering findings on chest and neck CT-- patient for outpatient venogram     LOS: 2 Courtne Lighty W @TODAY @10 :28 AM

## 2016-06-17 NOTE — Progress Notes (Signed)
  Date: 06/17/2016  Patient name: Logan Martin  Medical record number: [id number]  Date of birth: 10-05-1955   I have seen and evaluated this patient and I have discussed the plan of care with the house staff. Please see Dr. Alcario Drought note for complete details. I concur with his findings with the following additions/corrections:   Patient will be discharged today with appropriate follow up with Vascular Surgery.  He is aware of reasons to come back to the hospital.   Sid Falcon, MD 06/17/2016, 12:19 PM

## 2016-06-17 NOTE — Consult Note (Signed)
Swelling improved Will schedule outpt venogram for 06/22/16 Discussed with pt, our office will contact him   Ruta Hinds

## 2016-06-17 NOTE — Progress Notes (Signed)
   Subjective: Feels much better.  Facial swelling is gone and just some residual submental edema.  Wants to go home and is agreeable to come back for venogram with [facility]VS later this week.   Objective:  Vital signs in last 24 hours: Vitals:   06/16/16 2137 06/17/16 0254 06/17/16 0530 06/17/16 0953  BP: (!) 167/51  (!) 149/68 140/89  Pulse: 92  83 92  Resp: 20  20 20   Temp: 98.4 F (36.9 C)  98.2 F (36.8 C) 97.9 F (36.6 C)  TempSrc: Oral  Oral Oral  SpO2: 96%  96% 96%  Weight: 111 lb 3.2 oz (50.4 kg) 111 lb 1.8 oz (50.4 kg)    Height:       Physical Exam  Constitutional: He appears well-developed and well-nourished. No distress.  Sleeping in bed easily aroused.  HENT:  Mouth/Throat: Oropharynx is clear and moist.  Stable anterior neck swelling  Eyes: Conjunctivae and EOM are normal.  Cardiovascular: Normal rate and regular rhythm.   Pulmonary/Chest: Effort normal and breath sounds normal.  Neurological: He is alert.  Psychiatric: He has a normal mood and affect. His behavior is normal.     Assessment/Plan:  Principal Problem:   SVC (superior vena cava obstruction) Active Problems:   ESRD on dialysis ([facility])   Hepatitis C antibody test positive   Pancytopenia ([facility])   Left arm pain   61 y.o. male with tender submandibular and cervical edema.  Concern for SVC, deep neck infection with potential odontogenic sources, though neither his imaging nor exam are classic for Ludwig's angina.  He has chronic pancytopenia and new bone lesions are concerning for malignancy.  His airway is not threatened and he is hemodynamically stable and nontoxic.   #Submental Edema CT chest showed "interim placement of right subclavian vein vascular stent; there is suspected moderate severe stenosis of the SVC at the brachiocephalic venous confluence, just distal to the stent." Symptoms at this point appear most consistent with SVC considering findings on chest and neck CT -Appreciate VVS  consultation, will arrange for venogram later this week. -Discharge home today.  #Vertebral Lesion Sclerotic T3 lesion incidentally found on CT scan.  Renal osteodystrophy and metastatic disease are consideration. -PSA within normal limits -Consider bone scan outpatient  #Pancytopenia Chronic, largely stable since at least 2014.  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 -HCV viral load pending -Consider outpatient hematology referral  #ESRD -Appreciate renal recs for HD   #HTN -Continue home amlodipine, hydralazine, labetalol  Fluids: none Diet: renal DVT Prophylaxis: SCDs (heparin reaction on chart) Code Status: full  Dispo: Anticipated discharge in approximately today.   Jule Ser, DO 06/17/2016, 9:56 AM Pager: [phone]

## 2016-06-17 NOTE — Discharge Summary (Signed)
Name: Logan Martin MRN: [id number] DOB: [birth year] 61 y.o. PCP: [facility]  Date of Admission: 06/14/2016 11:39 AM Date of Discharge: 06/17/2016 Attending Physician: Lucious Groves, DO  Discharge Diagnosis:  Principal Problem:   SVC (superior vena cava obstruction) Active Problems:   ESRD on dialysis ([place])   Hepatitis C antibody test positive   Pancytopenia ([place])   Left arm pain   Discharge Medications: Allergies as of 06/17/2016      Reactions   Heparin Rash   Pork products    Pork-derived Products Rash   Sulfa Antibiotics Rash   Sulfonamide Derivatives Rash      Medication List    TAKE these medications   amLODipine 10 MG tablet Commonly known as:  NORVASC Take 10 mg by mouth daily.   calcium acetate 667 MG capsule Commonly known as:  PHOSLO Take 667 mg by mouth 5 (five) times daily. Takes 1 cap with 3 meals, 1 cap with 2 snacks   hydrALAZINE 50 MG tablet Commonly known as:  APRESOLINE Take 50 mg by mouth 3 (three) times daily.   labetalol 200 MG tablet Commonly known as:  NORMODYNE Take 200 mg by mouth 2 (two) times daily.   multivitamin Tabs tablet Take 1 tablet by mouth at bedtime.   Oxycodone HCl 10 MG Tabs Take 10 mg by mouth 2 (two) times daily. What changed:  Another medication with the same name was removed. Continue taking this medication, and follow the directions you see here.       Disposition and follow-up:   Logan Martin was discharged from [facility] in Stable condition.  At the hospital follow up visit please address:  1.  Patient follow up with vascular surgery to obtain venogram and possible intervention.  Based on HCV RNA results, consider follow up with Infectious Disease for treatment.  Consider outpatient bone scan due to T3 sclerotic lesion.  Consider hematology referral for pancytopenia (chronic and stable since 2014).  Consider outpatient ENT referral to address left external auditory  canal per CT findings below and recommendation from ENT consult note.  2.  Labs / imaging needed at time of follow-up: Bone scan, CBC, BMET  3.  Pending labs/ test needing follow-up: HCV RNA  Follow-up Appointments: Follow-up Information    [facility]. Schedule an appointment as soon as possible for a visit.   Why:  for hospital follow up. Contact information: [address] [place] [address] [phone]        Ruta Hinds, MD. Call.   Specialties:  Vascular Surgery, Cardiology Why:  their office will call you to arrange procedure later this week.  If you have not heard from them by Tuesday, please call them. 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  CT neck showed generalized neck edema and effusions (including retropharyngeal effusion) without drainable fluid collection.  ENT was consulted from the Emergency Department who determined the airway was patent and recommended empiric Unasyn although there were no other signs of infection.  Patient was  treated for 2 days with empiric antibiotics.  Other consideration was given to SVC given generalized edema, history of venous thrombosis and recent vascular surgery with brachiocephalic stenting. CT chest was obtained and showed "interim placement of right subclavian vein vascular stent; there is suspected moderate severe stenosis of the SVC at the brachiocephalic venous confluence, just distal to the stent." Symptoms at this point were felt to be most consistent with SVC considering findings on chest and neck CT.   Vascular surgery was consulted who made plans to arrange for outpatient venogram with possible intervention later in the week.  Patient was discharged with this follow up and strict return precautions.  He was also advised to follow up with his PCP.  Vertebral Lesion Sclerotic T3 lesion incidentally found on CT scan. Renal osteodystrophy and metastatic disease are consideration.  PSA within normal limits.  Consider bone scan outpatient.  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 HCV viral load pending.  Consider outpatient hematology referral  Findings on CT Neck Per ENT consult note from Dr. Redmond Baseman, "His ear will need to be addressed as an outpatient after discharge."  Please arrange for outpatient referral to ENT.  Findings of CT are below.  ESRD Managed inpatient with assistance from Nephrology service.  He was dialyzed on Saturday 1/20 and discharged to resume his home HD schedule.  HTN Continue home amlodipine, hydralazine, labetalol.  Discharge Vitals:   BP 140/89 (BP Location: Left Arm)   Pulse 92   Temp 97.9 F (36.6 C) (Oral)   Resp 20   Ht 5\' 4"  (1.626 m)   Wt 111 lb 1.8 oz (50.4 kg)   SpO2 96%   BMI 19.07 kg/m   Pertinent Labs, Studies, and Procedures:   CT Chest with Contrast IMPRESSION: 1. Multiple collateral vessels of the left chest wall, left paraspinous area and base of neck. Now seen are multiple collateral vessels within the mediastinum. Interim placement of right subclavian vein vascular stent ; there is suspected moderate severe stenosis of the SVC at the brachiocephalic venous confluence, just distal to the stent. There is mild narrowing of the distal SVC. 2. Moderate pleural effusions with atelectasis or infiltrate in the lower lobes. 3. Anasarca and 4. Polycystic kidneys and multiple liver cysts. New pancreatic duct dilatation.  CT Soft Tissue Neck with  Contrast IMPRESSION: Generalized neck edema and effusions (including retropharyngeal effusion) without drainable fluid collection.  Bilateral middle ear and mastoid effusions, likely postobstructive from hypertrophic nasopharyngeal soft tissues associated with recent viral illness and immunocompromised states. Additional erosive changes LEFT external auditory canal concerning for malignant otitis media though neoplasm is a consideration. Recommend direct inspection.  Increasing moderate LEFT and mild RIGHT pleural effusions.  New sclerotic lesion T3 vertebral body, recommend bone scan. No pathologic fracture.  Lab Results  Component Value Date   CREATININE 5.92 (H) 06/17/2016   CREATININE 5.91 (H) 06/17/2016   BUN 38 (H) 06/17/2016   BUN 37 (H) 06/17/2016   NA 137 06/17/2016   NA 138 06/17/2016   K 4.0 06/17/2016   K 4.0 06/17/2016   CL 99 (L) 06/17/2016   CL 99 (L) 06/17/2016   CO2 27 06/17/2016   CO2 28 06/17/2016   CBC Latest Ref Rng & Units 06/17/2016 06/16/2016 06/16/2016  WBC 4.0 - 10.5 K/uL 3.7(L) 3.1(L) 3.1(L)  Hemoglobin 13.0 - 17.0 g/dL 9.6(L) 10.0(L) 9.7(L)  Hematocrit 39.0 - 52.0 % 29.3(L) 30.5(L) 30.1(L)  Platelets 150 - 400 K/uL  92(L) 81(L) 86(L)      Discharge Instructions: Discharge Instructions    Call MD for:  difficulty breathing, headache or visual disturbances    Complete by:  As directed    Call MD for:  hives    Complete by:  As directed    Call MD for:  persistant dizziness or light-headedness    Complete by:  As directed    Call MD for:  persistant nausea and vomiting    Complete by:  As directed    Call MD for:  redness, tenderness, or signs of infection (pain, swelling, redness, odor or green/yellow discharge around incision site)    Complete by:  As directed    Call MD for:  severe uncontrolled pain    Complete by:  As directed    Call MD for:  temperature >100.4    Complete by:  As directed    Diet - low sodium heart healthy     Complete by:  As directed    Discharge instructions    Complete by:  As directed    Mr. Consalvo,   It was a pleasure taking care of you in the hospital.  Please follow up as scheduled for hemodialysis on Tues, Thurs, and Sat.  We discovered you may have some narrowing of blood vessels causing the swelling in your neck and chest.  Dr. Oneida Alar' office will arrange for a venogram next week to further work up this new issue.  Their office will call you to arrange this.  If you have not heard from them by Tuesday, please contact their office.  Please also follow up with your PCP.  If you notice any worsening symptoms such as chest pain, shortness of breath, difficulty swallowing, please return to the ED immediately.  Take Care.   Increase activity slowly    Complete by:  As directed       Signed: Jule Ser, DO 06/17/2016, 10:34 AM   Pager: [phone]

## 2016-06-17 NOTE — Progress Notes (Signed)
PT Cancellation Note  Patient Details Name: Logan Martin MRN: [id number] DOB: [birth year]   Cancelled Treatment:    Reason Eval/Treat Not Completed: PT screened, no needs identified, will sign off   Briefly discussed pt status with RN, who reports no PT needs;  Noted he is to dc soon;   Thank you,  Roney Marion, PT  Acute Rehabilitation Services Pager [phone] Office [place] 06/17/2016, 1:47 PM

## 2016-06-18 ENCOUNTER — Other Ambulatory Visit: Payer: Self-pay | Admitting: *Deleted

## 2016-06-20 LAB — CULTURE, BLOOD (ROUTINE X 2)
Culture: NO GROWTH
Culture: NO GROWTH

## 2016-06-21 LAB — HEPATITIS C GENOTYPE

## 2016-06-21 LAB — HCV RNA QUANT RFLX ULTRA OR GENOTYP
HCV RNA Qnt(log copy/mL): 3.808 log10 IU/mL
HepC Qn: 6430 IU/mL

## 2016-06-22 ENCOUNTER — Ambulatory Visit (HOSPITAL_COMMUNITY)
Admission: RE | Admit: 2016-06-22 | Discharge: 2016-06-22 | Disposition: A | Discharge status: Home / Self Care | Payer: Medicare Other | Source: Ambulatory Visit | Attending: Vascular Surgery | Admitting: Vascular Surgery

## 2016-06-22 ENCOUNTER — Encounter (HOSPITAL_COMMUNITY)
Admission: RE | Disposition: A | Discharge status: Home / Self Care | Payer: Self-pay | Source: Ambulatory Visit | Attending: Vascular Surgery

## 2016-06-22 DIAGNOSIS — T82898A Other specified complication of vascular prosthetic devices, implants and grafts, initial encounter: Secondary | ICD-10-CM | POA: Diagnosis not present

## 2016-06-22 DIAGNOSIS — B1920 Unspecified viral hepatitis C without hepatic coma: Secondary | ICD-10-CM | POA: Diagnosis not present

## 2016-06-22 DIAGNOSIS — Z833 Family history of diabetes mellitus: Secondary | ICD-10-CM | POA: Diagnosis not present

## 2016-06-22 DIAGNOSIS — Z882 Allergy status to sulfonamides status: Secondary | ICD-10-CM | POA: Insufficient documentation

## 2016-06-22 DIAGNOSIS — G8929 Other chronic pain: Secondary | ICD-10-CM | POA: Insufficient documentation

## 2016-06-22 DIAGNOSIS — Z8249 Family history of ischemic heart disease and other diseases of the circulatory system: Secondary | ICD-10-CM | POA: Diagnosis not present

## 2016-06-22 DIAGNOSIS — I871 Compression of vein: Secondary | ICD-10-CM | POA: Diagnosis not present

## 2016-06-22 DIAGNOSIS — Y832 Surgical operation with anastomosis, bypass or graft as the cause of abnormal reaction of the patient, or of later complication, without mention of misadventure at the time of the procedure: Secondary | ICD-10-CM | POA: Insufficient documentation

## 2016-06-22 DIAGNOSIS — I739 Peripheral vascular disease, unspecified: Secondary | ICD-10-CM | POA: Insufficient documentation

## 2016-06-22 DIAGNOSIS — D631 Anemia in chronic kidney disease: Secondary | ICD-10-CM | POA: Insufficient documentation

## 2016-06-22 DIAGNOSIS — Z87442 Personal history of urinary calculi: Secondary | ICD-10-CM | POA: Diagnosis not present

## 2016-06-22 DIAGNOSIS — F1411 Cocaine abuse, in remission: Secondary | ICD-10-CM | POA: Insufficient documentation

## 2016-06-22 DIAGNOSIS — R609 Edema, unspecified: Secondary | ICD-10-CM

## 2016-06-22 DIAGNOSIS — Z86711 Personal history of pulmonary embolism: Secondary | ICD-10-CM | POA: Insufficient documentation

## 2016-06-22 DIAGNOSIS — N186 End stage renal disease: Secondary | ICD-10-CM | POA: Diagnosis not present

## 2016-06-22 DIAGNOSIS — Z992 Dependence on renal dialysis: Secondary | ICD-10-CM | POA: Insufficient documentation

## 2016-06-22 DIAGNOSIS — Z87891 Personal history of nicotine dependence: Secondary | ICD-10-CM | POA: Diagnosis not present

## 2016-06-22 DIAGNOSIS — M545 Low back pain: Secondary | ICD-10-CM | POA: Diagnosis not present

## 2016-06-22 DIAGNOSIS — Z955 Presence of coronary angioplasty implant and graft: Secondary | ICD-10-CM | POA: Diagnosis not present

## 2016-06-22 DIAGNOSIS — I120 Hypertensive chronic kidney disease with stage 5 chronic kidney disease or end stage renal disease: Secondary | ICD-10-CM | POA: Diagnosis not present

## 2016-06-22 HISTORY — PX: PERIPHERAL VASCULAR CATHETERIZATION: SHX172C

## 2016-06-22 LAB — POCT I-STAT, CHEM 8
BUN: 23 mg/dL — ABNORMAL HIGH (ref 6–20)
CALCIUM ION: 1.1 mmol/L — AB (ref 1.15–1.40)
Chloride: 98 mmol/L — ABNORMAL LOW (ref 101–111)
Creatinine, Ser: 6.4 mg/dL — ABNORMAL HIGH (ref 0.61–1.24)
Glucose, Bld: 103 mg/dL — ABNORMAL HIGH (ref 65–99)
HEMATOCRIT: 25 % — AB (ref 39.0–52.0)
Hemoglobin: 8.5 g/dL — ABNORMAL LOW (ref 13.0–17.0)
POTASSIUM: 3.5 mmol/L (ref 3.5–5.1)
SODIUM: 140 mmol/L (ref 135–145)
TCO2: 29 mmol/L (ref 0–100)

## 2016-06-22 SURGERY — UPPER EXTREMITY VENOGRAPHY
Laterality: Right

## 2016-06-22 MED ORDER — MORPHINE SULFATE (PF) 10 MG/ML IV SOLN: 2.0000 mg | INTRAVENOUS | Status: DC | PRN

## 2016-06-22 MED ORDER — LABETALOL HCL 5 MG/ML IV SOLN: 10.0000 mg | INTRAVENOUS | Status: DC | PRN

## 2016-06-22 MED ORDER — ONDANSETRON HCL 4 MG/2ML IJ SOLN: 4.0000 mg | Freq: Four times a day (QID) | INTRAMUSCULAR | Status: DC | PRN

## 2016-06-22 MED ORDER — ACETAMINOPHEN 325 MG RE SUPP
325.0000 mg | RECTAL | Status: DC | PRN
  Filled 2016-06-22: qty 2

## 2016-06-22 MED ORDER — ACETAMINOPHEN 325 MG PO TABS
325.0000 mg | ORAL_TABLET | ORAL | Status: DC | PRN
  Filled 2016-06-22: qty 2

## 2016-06-22 MED ORDER — SODIUM CHLORIDE 0.9% FLUSH: 3.0000 mL | Freq: Two times a day (BID) | INTRAVENOUS | Status: DC

## 2016-06-22 MED ORDER — LIDOCAINE HCL (PF) 1 % IJ SOLN
INTRAMUSCULAR | Status: AC
  Filled 2016-06-22: qty 30

## 2016-06-22 MED ORDER — HYDRALAZINE HCL 20 MG/ML IJ SOLN: 5.0000 mg | INTRAMUSCULAR | Status: DC | PRN

## 2016-06-22 MED ORDER — SODIUM CHLORIDE 0.9 % IV SOLN: 250.0000 mL | INTRAVENOUS | Status: DC | PRN

## 2016-06-22 MED ORDER — SODIUM CHLORIDE 0.9% FLUSH: 3.0000 mL | INTRAVENOUS | Status: DC | PRN

## 2016-06-22 MED ORDER — LIDOCAINE HCL (PF) 1 % IJ SOLN
INTRAMUSCULAR | Status: DC | PRN
  Administered 2016-06-22: 2 mL via SUBCUTANEOUS

## 2016-06-22 MED ORDER — IODIXANOL 320 MG/ML IV SOLN
INTRAVENOUS | Status: DC | PRN
  Administered 2016-06-22: 80 mL via INTRAVENOUS

## 2016-06-22 MED ORDER — METOPROLOL TARTRATE 5 MG/5ML IV SOLN: 2.0000 mg | INTRAVENOUS | Status: DC | PRN

## 2016-06-22 SURGICAL SUPPLY — 21 items
BAG SNAP BAND KOVER 36X36 (MISCELLANEOUS) ×3 IMPLANT
BALLN ARMADA 10X40X80 (BALLOONS) ×3
BALLN ARMADA 12X20X80 (BALLOONS) ×3
BALLN MUSTANG 9X40X75 (BALLOONS) ×3
BALLOON ARMADA 10X40X80 (BALLOONS) ×2 IMPLANT
BALLOON ARMADA 12X20X80 (BALLOONS) ×2 IMPLANT
BALLOON MUSTANG 9X40X75 (BALLOONS) ×2 IMPLANT
CATH STRAIGHT 5FR 65CM (CATHETERS) ×3 IMPLANT
COVER DOME SNAP 22 D (MISCELLANEOUS) ×3 IMPLANT
COVER PRB 48X5XTLSCP FOLD TPE (BAG) ×2 IMPLANT
COVER PROBE 5X48 (BAG) ×1
GUIDEWIRE ANGLED .035X150CM (WIRE) ×3 IMPLANT
KIT ENCORE 26 ADVANTAGE (KITS) ×3 IMPLANT
PROTECTION STATION PRESSURIZED (MISCELLANEOUS) ×3
SHEATH PINNACLE R/O II 6F 4CM (SHEATH) ×3 IMPLANT
STATION PROTECTION PRESSURIZED (MISCELLANEOUS) ×2 IMPLANT
STOPCOCK MORSE 400PSI 3WAY (MISCELLANEOUS) ×3 IMPLANT
TRAY PV CATH (CUSTOM PROCEDURE TRAY) ×3 IMPLANT
TUBING CIL FLEX 10 FLL-RA (TUBING) ×3 IMPLANT
WIRE HI TORQ VERSACORE J 260CM (WIRE) ×3 IMPLANT
WIRE MINI STICK MAX (SHEATH) ×3 IMPLANT

## 2016-06-22 NOTE — Interval H&P Note (Signed)
History and Physical Interval Note:  06/22/2016 10:33 AM  Logan Martin  has presented today for surgery, with the diagnosis of abstruction - renal pt  The various methods of treatment have been discussed with the patient and family. After consideration of risks, benefits and other options for treatment, the patient has consented to  Procedure(s): Left Arm Venography (N/A) A/V Fistulagram - Right Arm (N/A) as a surgical intervention .  The patient's history has been reviewed, patient examined, no change in status, stable for surgery.  I have reviewed the patient's chart and labs.  Questions were answered to the patient's satisfaction.     Ruta Hinds

## 2016-06-22 NOTE — Op Note (Signed)
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 The patient was placed supine position the Angio table. Contrast was infused through a pre-existing left hand IV. This showed the left subclavian and innominate vein was occluded from the clavicle all the way centrally. This point attention was turned the right upper extremity. Right extremity is prepped and draped in usual sterile fashion. Local anesthesia was infiltrated over the midportion of the right upper arm AV fistula. Micropuncture needle was used to cannulate the fistula without difficulty. Dr. guidewire was threaded through this. There was some tortuosity in the fistula. Therefore I was only able to advance the sheath a few centimeters into the fistula. Contrast antrum was performed. There was some tortuosity in the fistula in the distal third. There is a stent in the right subclavian and innominate vein which is widely patent. There is a 70% narrowing of the superior vena cava atrial junction. An 035 angled Glidewire is then advanced across the fistula and across the narrowing of the SVC down into the inferior vena cava. The micropuncture sheath was exchanged over the Glidewire for a 6 French short sheath. Consecutive 9 x 4 and 10 x 4 angioplasty balloons were then advanced and centered on the narrowing and inflated to nominal pressure for 1 minute each. There was immediate recoil at each of these. A 12 x 2 angioplasty balloon was then advanced and centered on the lesion and inflated initially to 4 atm which was  nominal pressure. It was then inflated to 8 atm which made the balloon 12.8 mm. Each of these inflations was done for minute. Completion angiogram showed that there still was some residual stenosis of this. Vena cava but this is been reduced from 70% down to about 50%. This point the procedure was concluded. Balloon and guidewire was removed. A figure-of-eight Monocryl stitch was placed at the sheath insertion site and the sheath removed.  The patient tolerated the procedure well and there were complications. The patient was taken to the holding area in stable condition.  Operative management: The patient has a 50% narrowing of the superior vena cava and atrial junction. This would be amenable to additional percutaneous interventions.  Ruta Hinds, MD [facility] Office: [phone] Pager: [phone]

## 2016-06-22 NOTE — H&P (View-Only) (Signed)
Referring Physician: Dr Juleen China  Patient name: Logan Martin MRN: [id number] DOB: 01-Sep-1955 Sex: male  REASON FOR CONSULT: rule out SVC syndrome  HPI: Logan Martin is a 61 y.o. male long term HD pt with recent right innominate SVC stent in [place].  He recently developed facial swelling and dyspnea which is now improved. He also complains of several weeks of left arm swelling but no real swelling in the right arm or problems with his right arm AV fistula.  He also complains of some left side chest and should pain.  Other medical problems include ESRD T Th Sat dialysis, hep C both of which are stable.  Past Medical History:  Diagnosis Date  . Alcohol abuse    quit date in 1995  . Anemia   . Chronic low back pain   . Cocaine abuse in remission    Quit date in 1995  . 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 Started HD in [place].  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  On NxStage home hemo since 2010. Using RUA AVF.    [name] ESRD on dialysis ([place])    "TTS; [place]" (06/14/2016)  . Hepatitis C   . History of blood transfusion 2007 X 1  . History of kidney stones   . Hypertension   . Peripheral vascular disease ([place])   . Pulmonary embolism [facility]) Feb 2012   Treated with coumadin x 1 year  . Renal insufficiency    Past Surgical History:  Procedure Laterality Date  . ABDOMINAL HERNIA REPAIR  ~ 2000 X 2; 2001 X1  . AV FISTULA PLACEMENT Right 2007   upper arm  . AV FISTULA REPAIR Right ~ 03/2016 & 04/2016  . BLADDER AUGMENTATION  1975  . CORONARY ANGIOPLASTY WITH STENT PLACEMENT  1999  . EXTRACORPOREAL SHOCK WAVE LITHOTRIPSY    . HERNIA REPAIR    . INGUINAL HERNIA REPAIR Right 1975    Family History  Problem Relation Age of Onset  . Cancer Mother   . Diabetes Mother   . Hypertension Mother   . Deep vein thrombosis Sister   . Deep vein thrombosis Daughter     SOCIAL HISTORY: Social History   Social History  .  Marital status: Single    Spouse name: N/A  . Number of children: N/A  . Years of education: N/A   Occupational History  . Not on file.   Social History Main Topics  . Smoking status: Former Smoker    Packs/day: 1.00    Years: 5.00    Types: Cigarettes    Quit date: 05/28/1993  . Smokeless tobacco: Never Used  . Alcohol use Yes     Comment: 06/14/2016 "nothing since 1995"  . Drug use: Yes    Types: Marijuana, Cocaine     Comment: 06/14/2016 "nothing since 1995"  . Sexual activity: Yes   Other Topics Concern  . Not on file   Social History Narrative  . No narrative on file    Allergies  Allergen Reactions  . Heparin Rash    Pork products   . Pork-Derived Products Rash  . Sulfa Antibiotics Rash  . Sulfonamide Derivatives Rash    Current Facility-Administered Medications  Medication Dose Route Frequency Provider Last Rate Last Dose  . amLODipine (NORVASC) tablet 10 mg  10 mg Oral Daily Jule Ser, DO   10 mg at 06/16/16 1207  . calcitRIOL (ROCALTROL) 0.25 MCG capsule           .  calcium acetate (PHOSLO) capsule 667 mg  667 mg Oral 5 times per day Jule Ser, DO   667 mg at 06/16/16 1208  . Darbepoetin Alfa (ARANESP) 60 MCG/0.3ML injection           . doxercalciferol (HECTOROL) injection 2 mcg  2 mcg Intravenous Q T,Th,Sa-HD Valentina Gu, NP   2 mcg at 06/16/16 0759  . feeding supplement (NEPRO CARB STEADY) liquid 237 mL  237 mL Oral BID BM Valentina Gu, NP      . hydrALAZINE (APRESOLINE) tablet 100 mg  100 mg Oral TID Valentina Gu, NP   100 mg at 06/15/16 2321  . labetalol (NORMODYNE) tablet 200 mg  200 mg Oral BID Jule Ser, DO   200 mg at 06/16/16 1207  . multivitamin (RENA-VIT) tablet 1 tablet  1 tablet Oral QHS Jule Ser, DO   1 tablet at 06/15/16 2321  . oxyCODONE (Oxy IR/ROXICODONE) immediate release tablet 10 mg  10 mg Oral BID PRN Jule Ser, DO   10 mg at 06/14/16 2203    ROS:   General:  No weight loss, Fever,  chills  HEENT: No recent headaches, no nasal bleeding, no visual changes, no sore throat  Neurologic: No dizziness, blackouts, seizures. No recent symptoms of stroke or mini- stroke. No recent episodes of slurred speech, or temporary blindness.  Cardiac: No recent episodes of chest pain/pressure, no shortness of breath at rest.  No shortness of breath with exertion.  Denies history of atrial fibrillation or irregular heartbeat  Vascular: No history of rest pain in feet.  No history of claudication.  No history of non-healing ulcer, No history of DVT   Pulmonary: No home oxygen, no productive cough, no hemoptysis,  No asthma or wheezing  Musculoskeletal:  [ ]  Arthritis, [ ]  Low back pain,  [ ]  Joint pain  Hematologic:No history of hypercoagulable state.  No history of easy bleeding.  No history of anemia  Gastrointestinal: No hematochezia or melena,  No gastroesophageal reflux, no trouble swallowing  Urinary: [X]  chronic Kidney disease, [X]  on HD - [ ]  MWF or [X]  TTHS, [ ]  Burning with urination, [ ]  Frequent urination, [ ]  Difficulty urinating;   Skin: No rashes  Psychological: No history of anxiety,  No history of depression   Physical Examination  Vitals:   06/16/16 1000 06/16/16 1030 06/16/16 1108 06/16/16 1154  BP: 131/65 (!) 151/79 (!) 143/75 (!) 170/60  Pulse: 82 87 76 76  Resp:   (!) 22 20  Temp:  97.5 F (36.4 C) 97.5 F (36.4 C) 97.6 F (36.4 C)  TempSrc:  Oral Oral Oral  SpO2:   96% 98%  Weight:   112 lb 7 oz (51 kg)   Height:        Body mass index is 19.3 kg/m.  General:  Alert and oriented, no acute distress HEENT: Normal, no periorbital edema Neck: No bruit or JVD mild submental swelling but overall soft Pulmonary: Clear to auscultation bilaterally, a few right side chest wall collateral veins Cardiac: Regular Rate and Rhythm  Abdomen: Soft, non-tender, non-distended Skin: No rash Extremity Pulses:  2+ radial, brachial + thrill right arm  AVF Musculoskeletal: No deformity left forearm edema  Neurologic: Upper and lower extremity motor 5/5 and symmetric  DATA:  CBC    Component Value Date/Time   WBC 3.1 (L) 06/16/2016 0753   RBC 3.27 (L) 06/16/2016 0753   HGB 10.0 (L) 06/16/2016 0753   HCT 30.5 (L)  06/16/2016 0753   PLT 81 (L) 06/16/2016 0753   MCV 93.3 06/16/2016 0753   MCH 30.6 06/16/2016 0753   MCHC 32.8 06/16/2016 0753   RDW 15.6 (H) 06/16/2016 0753   LYMPHSABS 1.3 06/15/2016 0522   MONOABS 0.2 06/15/2016 0522   EOSABS 0.1 06/15/2016 0522   BASOSABS 0.0 06/15/2016 0522    BMET    Component Value Date/Time   NA 136 06/16/2016 0358   NA 137 06/16/2016 0358   K 4.4 06/16/2016 0358   K 4.4 06/16/2016 0358   CL 101 06/16/2016 0358   CL 101 06/16/2016 0358   CO2 22 06/16/2016 0358   CO2 23 06/16/2016 0358   GLUCOSE 109 (H) 06/16/2016 0358   GLUCOSE 107 (H) 06/16/2016 0358   BUN 59 (H) 06/16/2016 0358   BUN 59 (H) 06/16/2016 0358   CREATININE 9.31 (H) 06/16/2016 0358   CREATININE 9.33 (H) 06/16/2016 0358   CALCIUM 9.6 06/16/2016 0358   CALCIUM 9.6 06/16/2016 0358   GFRNONAA 5 (L) 06/16/2016 0358   GFRNONAA 5 (L) 06/16/2016 0358   GFRAA 6 (L) 06/16/2016 0358   GFRAA 6 (L) 06/16/2016 0358   CT chest right subclavian innominate SVC stent with narrowed distal SVC at atrial junction  ASSESSMENT:  Pt with some nuisance submental swelling no obvious airway compromise or stridor.  No significant facial edema.  He does have evidence of superior vena cava narrowing on chest CT and with some of his clincal symptoms but usually these symptoms are self limited and though aggravating not life threatening.  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  Doubt his chest pain is secondary to this.  PLAN:  Bilateral central venogram with possible PTA or stent of SVC 06/22/16.  If pt symptoms are tolerable he can be discharged and brought back for procedure.   Ruta Hinds, MD [facility] Office: [phone] Pager: [phone]

## 2016-06-22 NOTE — Discharge Instructions (Signed)
Venogram, Care After Refer to this sheet in the next few weeks. These instructions provide you with information on caring for yourself after your procedure. Your health care provider may also give you more specific instructions. Your treatment has been planned according to current medical practices, but problems sometimes occur. Call your health care provider if you have any problems or questions after your procedure. WHAT TO EXPECT AFTER THE PROCEDURE After your procedure, it is typical to have the following sensations:  Mild discomfort at the catheter insertion site. HOME CARE INSTRUCTIONS   Take all medicines exactly as directed.  Follow any prescribed diet.  Follow instructions regarding both rest and physical activity.  Drink more fluids for the first several days after the procedure in order to help flush dye from your kidneys. SEEK MEDICAL CARE IF:  You develop a rash.  You have fever not controlled by medicine. SEEK IMMEDIATE MEDICAL CARE IF:  There is pain, drainage, bleeding, redness, swelling, warmth or a red streak at the site of the IV tube.  The extremity where your IV tube was placed becomes discolored, numb, or cool.  You have difficulty breathing or shortness of breath.  You develop chest pain.  You have excessive dizziness or fainting. This information is not intended to replace advice given to you by your health care provider. Make sure you discuss any questions you have with your health care provider. Document Released: 03/04/2013 Document Revised: 05/19/2013 Document Reviewed: 03/04/2013 Elsevier Interactive Patient Education  2017 [place] After Introduction Refer to this sheet in the next few weeks. These instructions provide you with information on caring for yourself after your procedure. Your health care provider may also give you more specific instructions. Your treatment has been planned according to current medical  practices, but problems sometimes occur. Call your health care provider if you have any problems or questions after your procedure. What can I expect after the procedure? After your procedure, it is typical to have the following:  A small amount of discomfort in the area where the catheters were placed.  A small amount of bruising around the fistula.  Sleepiness and fatigue. Follow these instructions at home:  Rest at home for the day following your procedure.  Do not drive or operate heavy machinery while taking pain medicine.  Take medicines only as directed by your health care provider.  Do not take baths, swim, or use a hot tub until your health care provider approves. You may shower 24 hours after the procedure or as directed by your health care provider.  There are many different ways to close and cover an incision, including stitches, skin glue, and adhesive strips. Follow your health care provider's instructions on:  Incision care.  Bandage (dressing) changes and removal.  Incision closure removal.  Monitor your dialysis fistula carefully. Contact a health care provider if:  You have drainage, redness, swelling, or pain at your catheter site.  You have a fever.  You have chills. Get help right away if:  You feel weak.  You have trouble balancing.  You have trouble moving your arms or legs.  You have problems with your speech or vision.  You can no longer feel a vibration or buzz when you put your fingers over your dialysis fistula.  The limb that was used for the procedure:  Swells.  Is painful.  Is cold.  Is discolored, such as blue or pale white. This information is not intended to replace  advice given to you by your health care provider. Make sure you discuss any questions you have with your health care provider. Document Released: 09/28/2013 Document Revised: 10/20/2015 Document Reviewed: 07/03/2013  2017 Elsevier

## 2016-06-25 ENCOUNTER — Encounter (HOSPITAL_COMMUNITY): Payer: Self-pay | Admitting: Vascular Surgery

## 2016-08-15 ENCOUNTER — Ambulatory Visit (INDEPENDENT_AMBULATORY_CARE_PROVIDER_SITE_OTHER): Payer: Medicare Other | Admitting: Internal Medicine

## 2016-08-15 ENCOUNTER — Encounter: Payer: Self-pay | Admitting: Internal Medicine

## 2016-08-15 VITALS — BP 193/83 | HR 77 | Temp 98.1°F | Ht 64.0 in | Wt 115.0 lb

## 2016-08-15 DIAGNOSIS — B182 Chronic viral hepatitis C: Secondary | ICD-10-CM

## 2016-08-15 DIAGNOSIS — Z23 Encounter for immunization: Secondary | ICD-10-CM

## 2016-08-15 MED ORDER — GLECAPREVIR-PIBRENTASVIR 100-40 MG PO TABS: 3.0000 | ORAL_TABLET | Freq: Every day | ORAL | 1 refills | Status: DC

## 2016-08-15 NOTE — Progress Notes (Signed)
[place] for Infectious Disease   CC: consideration for treatment for chronic hepatitis C  HPI:  +Logan Martin is a 61 y.o. male who presents for initial evaluation and management of chronic hepatitis C.  Patient tested positive in about 2007. Hepatitis C-associated risk factors present are: none. Patient denies IV drug abuse. Patient has had other studies performed. Results: hepatitis C RNA by PCR, result: positive. Patient has not had prior treatment for Hepatitis C. Patient does not have a past history of liver disease. Patient does not have a family history of liver disease. Patient does not  have associated signs or symptoms related to liver disease.  Labs reviewed from records and confirm chronic hepatitis C with a positive viral load and has genotype 1a.  He is on dialysis and sees Dr. Lorrene Reid.   Records reviewed from [facility] and is hepatitis A and B non-immune      Patient does not have documented immunity to Hepatitis A. Patient does not have documented immunity to Hepatitis B.    Review of Systems:   Constitutional: negative for fevers, chills, fatigue, malaise and anorexia Gastrointestinal: negative for diarrhea Musculoskeletal: negative for myalgias and arthralgias All other systems reviewed and are negative       Past Medical History:  Diagnosis Date  . Alcohol abuse    quit date in 1995  . Anemia   . Chronic low back pain   . Cocaine abuse in remission    Quit date in 1995  . 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 Started HD in [place].  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  On NxStage home hemo since 2010. Using RUA AVF.    [name] ESRD on dialysis ([place])    "TTS; [place]" (06/14/2016)  . Hepatitis C   . History of blood transfusion 2007 X 1  . History of kidney stones   . Hypertension   . Peripheral vascular disease ([place])   . Pulmonary embolism [facility]) Feb 2012   Treated with  coumadin x 1 year  . Renal insufficiency     Prior to Admission medications   Medication Sig Start Date End Date Taking? Authorizing Provider  calcium acetate (PHOSLO) 667 MG capsule Take 2 capsules by mouth 3 (three) times daily.   Yes Historical Provider, MD  hydrALAZINE (APRESOLINE) 50 MG tablet Take 50 mg by mouth 3 (three) times daily. 05/11/16  Yes Historical Provider, MD  labetalol (NORMODYNE) 200 MG tablet Take 200 mg by mouth 2 (two) times daily. 05/11/16  Yes Historical Provider, MD  multivitamin (RENA-VIT) TABS tablet Take 1 tablet by mouth at bedtime. 06/09/13  Yes Clanford Marisa Hua, MD  oxycodone (ROXICODONE) 30 MG immediate release tablet Take 30 mg by mouth 3 (three) times daily.   Yes Historical Provider, MD  Oxycodone HCl 10 MG TABS Take 10 mg by mouth 3 (three) times daily as needed.  05/22/16  Yes Historical Provider, MD  Glecaprevir-Pibrentasvir (MAVYRET) 100-40 MG TABS Take 3 tablets by mouth daily. 08/15/16   Thayer Headings, MD    Allergies  Allergen Reactions  . Heparin Rash    Pork products   . Pork-Derived Products Rash  . Sulfa Antibiotics Rash  . Sulfonamide Derivatives Rash    Social History  Substance Use Topics  . Smoking status: Former Smoker    Packs/day: 1.00    Years: 5.00    Types: Cigarettes  Quit date: 05/28/1993  . Smokeless tobacco: Never Used  . Alcohol use No     Comment: 06/14/2016 "nothing since 1995"    Family History  Problem Relation Age of Onset  . Cancer Mother   . Diabetes Mother   . Hypertension Mother   . Deep vein thrombosis Sister   . Deep vein thrombosis Daughter      Objective:  Constitutional: in no apparent distress,  Vitals:   08/15/16 0859  BP: (!) 193/83  Pulse: 77  Temp: 98.1 F (36.7 C)   Eyes: anicteric Cardiovascular: Cor RRR Respiratory: CTA B; normal respiratory effort Gastrointestinal: Bowel sounds are normal, soft, nt Musculoskeletal: no pedal edema noted Skin: negatives: no rash; no porphyria  cutanea tarda Lymphatic: no cervical lymphadenopathy   Laboratory Genotype: No results found for: HCVGENOTYPE HCV viral load: No results found for: HCVQUANT Lab Results  Component Value Date   WBC 3.7 (L) 06/17/2016   HGB 8.5 (L) 06/22/2016   HCT 25.0 (L) 06/22/2016   MCV 93.6 06/17/2016   PLT 92 (L) 06/17/2016    Lab Results  Component Value Date   CREATININE 6.40 (H) 06/22/2016   BUN 23 (H) 06/22/2016   NA 140 06/22/2016   K 3.5 06/22/2016   CL 98 (L) 06/22/2016   CO2 27 06/17/2016   CO2 28 06/17/2016    Lab Results  Component Value Date   ALT 9 (L) 06/14/2016   AST 20 06/14/2016   ALKPHOS 56 06/14/2016     Labs and history reviewed and show CHILD-PUGH unknown, no recent INR.  Platelets 122.    5-6 points: Child class A 7-9 points: Child class B 10-15 points: Child class C  Lab Results  Component Value Date   INR 1.10 12/06/2010   BILITOT 0.9 06/14/2016   ALBUMIN 3.0 (L) 06/17/2016   ALBUMIN 3.0 (L) 06/17/2016     Assessment: New Patient with Chronic Hepatitis C genotype 1a, untreated.  I discussed with the patient the lab findings that confirm chronic hepatitis C as well as the natural history and progression of disease including about 30% of people who develop cirrhosis of the liver if left untreated and once cirrhosis is established there is a 2-7% risk per year of liver cancer and liver failure.  I discussed the importance of treatment and benefits in reducing the risk, even if significant liver fibrosis exists.   Plan: 1) Patient counseled extensively on limiting acetaminophen to no more than 2 grams daily, avoidance of alcohol. 2) Transmission discussed with patient including sexual transmission, sharing razors and toothbrush.   3) Will need referral to gastroenterology if concern for cirrhosis 4) Will need referral for substance abuse counseling: No.; Further work up to include urine drug screen  No. 5) Will prescribe Mayvret for 8 weeks 12 weeks 6)  Hepatitis A vaccine Yes.   7) Hepatitis B vaccine Yes.   8) Pneumovax vaccine previously given 9) Further work up to include liver staging with elastography 10) will follow up after starting medication

## 2016-08-15 NOTE — Patient Instructions (Signed)
Date 08/15/16  Dear Mr. Main, As discussed in the [facility], your hepatitis C therapy will include the following medications:          Mavyret (glecaprevir 100 mg/pibrentasvir 40 mg): Take 3 tablets by mouth once daily for 8 or 12 weeks ---------------------------------------------------------------- Your HCV Treatment Start Date: TBA   Your HCV genotype:  1a    Liver Fibrosis: TBD    ---------------------------------------------------------------- YOUR PHARMACY CONTACT:   [facility] [address] [place], [place] [address] Phone: [phone] Hours: Monday to Friday 7:30 am to 6:00 pm   Please always contact your pharmacy at least 3-4 business days before you run out of medications to ensure your next month's medication is ready or 1 week prior to running out if you receive it by mail.  Remember, each prescription is for 28 days. ---------------------------------------------------------------- GENERAL NOTES REGARDING YOUR HEPATITIS C MEDICATION:  Mavyret: - tablets are pink, oblong shape - take 3 tablets daily with food. - The tablets should be stored at room temperature.  - Acid reducing agents such as H2 blockers (ie. Pepcid (famotidine), Zantac (ranitidine), Tagamet (cimetidine), Axid (nizatidine) and proton pump inhibitors (ie. Prilosec (omeprazole), Protonix (pantoprazole), Nexium (esomeprazole), or Aciphex (rabeprazole)) can decrease effectiveness of Harvoni. Do not take until you have discussed with a health care provider.    -Antacids that contain magnesium and/or aluminum hydroxide (ie. Milk of Magensia, Rolaids, Gaviscon, Maalox, Mylanta, an dArthritis Pain Formula) can reduce absorption of Harvoni, so take them at least 4 hours before or after Harvoni.  -Calcium carbonate (calcium supplements or antacids such as Tums, Caltrate, Os-Cal) needs to be taken at least 4 hours hours before or after Harvoni.  -St. John's wort or any products that contain  St. John's wort like some herbal supplements  Please inform the office prior to starting any of these medications.  - The common side effects associated with Mavyret include:      1. Fatigue      2. Headache      3. Nausea      4. Diarrhea      5. Insomnia  Please note that this only lists the most common side effects and is NOT a comprehensive list of the potential side effects of these medications. For more information, please review the drug information sheets that come with your medication package from the pharmacy.  ---------------------------------------------------------------- GENERAL HELPFUL HINTS ON HCV THERAPY: 1. Stay well-hydrated. 2. Notify the [facility] Clinic of any changes in your other over-the-counter/herbal or prescription medications. 3. If you miss a dose of your medication, take the missed dose as soon as you remember. Return to your regular time/dose schedule the next day.  4.  Do not stop taking your medications without first talking with your healthcare provider. 5.  You may take Tylenol (acetaminophen), as long as the dose is less than 2000 mg (OR no more than 4 tablets of the Tylenol Extra Strengths 500mg  tablet) in 24 hours. 6.  You will see our pharmacist-specialist within the first 2 weeks of starting your medication to monitor for any possible side effects. 7.  You will have labs once during treatment, after soon after treatment completion and one final lab 6 months after treatment completion to verify the virus is out of your system.  Scharlene Gloss, [place] for [place], [place]  [phone]

## 2016-08-15 NOTE — Addendum Note (Signed)
Addended by: Myrtis Hopping A on: 08/15/2016 09:53 AM   Modules accepted: Orders

## 2016-09-05 ENCOUNTER — Ambulatory Visit (HOSPITAL_COMMUNITY): Payer: Medicare Other | Attending: Internal Medicine

## 2016-09-06 ENCOUNTER — Ambulatory Visit (HOSPITAL_COMMUNITY): Payer: Medicare Other

## 2016-10-09 ENCOUNTER — Encounter (HOSPITAL_COMMUNITY): Payer: Self-pay

## 2016-10-09 ENCOUNTER — Emergency Department (HOSPITAL_COMMUNITY)
Admission: EM | Admit: 2016-10-09 | Discharge: 2016-10-09 | Disposition: A | Discharge status: Home / Self Care | Payer: Medicare Other | Attending: Emergency Medicine | Admitting: Emergency Medicine

## 2016-10-09 DIAGNOSIS — W260XXA Contact with knife, initial encounter: Secondary | ICD-10-CM | POA: Diagnosis not present

## 2016-10-09 DIAGNOSIS — Y929 Unspecified place or not applicable: Secondary | ICD-10-CM | POA: Insufficient documentation

## 2016-10-09 DIAGNOSIS — Y939 Activity, unspecified: Secondary | ICD-10-CM | POA: Diagnosis not present

## 2016-10-09 DIAGNOSIS — N186 End stage renal disease: Secondary | ICD-10-CM | POA: Diagnosis not present

## 2016-10-09 DIAGNOSIS — I2510 Atherosclerotic heart disease of native coronary artery without angina pectoris: Secondary | ICD-10-CM | POA: Insufficient documentation

## 2016-10-09 DIAGNOSIS — S61011A Laceration without foreign body of right thumb without damage to nail, initial encounter: Secondary | ICD-10-CM

## 2016-10-09 DIAGNOSIS — Y999 Unspecified external cause status: Secondary | ICD-10-CM | POA: Diagnosis not present

## 2016-10-09 DIAGNOSIS — Z992 Dependence on renal dialysis: Secondary | ICD-10-CM | POA: Diagnosis not present

## 2016-10-09 DIAGNOSIS — Z79899 Other long term (current) drug therapy: Secondary | ICD-10-CM | POA: Insufficient documentation

## 2016-10-09 DIAGNOSIS — I120 Hypertensive chronic kidney disease with stage 5 chronic kidney disease or end stage renal disease: Secondary | ICD-10-CM | POA: Diagnosis not present

## 2016-10-09 NOTE — ED Provider Notes (Signed)
[facility] DEPT Provider Note   CSN: [id number] Arrival date & time: 10/09/16  1737  By signing my name below, I, Dora Sims, attest that this documentation has been prepared under the direction and in the presence of non-physician practitioner, [facility] M. Janit Bern, NP. Electronically Signed: Dora Sims, Scribe. 10/09/2016. 7:28 PM.  History   Chief Complaint Chief Complaint  Patient presents with  . Laceration   The history is provided by the patient. No language interpreter was used.  Laceration   The incident occurred yesterday. The laceration is located on the right hand. The laceration mechanism was a a dirty knife. The pain is moderate. The pain has been constant since onset. He reports no foreign bodies present. His tetanus status is unknown.    HPI Comments: Logan Martin is a 61 y.o. male with PMHx including HTN, Hepatits C, and ESRD on HD who presents to the Emergency Department complaining of a laceration sustained to his right thumb while cutting potatoes last night. Patient states he rinsed and bandaged the wound last night prior to going to bed. He states the wound has bled persistently today and he was advised by staff at his dialysis treatment center to come to the ED to have it evaluated. Patient had the wound re-bandaged at dialysis this afternoon. He endorses significant pain secondary to the wound presently. No medications or treatments tried. No blood thinners. His last tetanus vaccination was >5 years ago. Patient denies numbness/tingling or any other associated symptoms.   Past Medical History:  Diagnosis Date  . Alcohol abuse    quit date in 1995  . Anemia   . Chronic low back pain   . Cocaine abuse in remission    Quit date in 1995  . 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 Started HD in [place], [place].  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  On NxStage home hemo since 2010. Using RUA AVF.    [name] ESRD on  dialysis ([place])    "TTS; [place]" (06/14/2016)  . Hepatitis C   . History of blood transfusion 2007 X 1  . History of kidney stones   . Hypertension   . Peripheral vascular disease ([place])   . Pulmonary embolism [facility]) Feb 2012   Treated with coumadin x 1 year  . Renal insufficiency     Patient Active Problem List   Diagnosis Date Noted  . Pancytopenia ([place]) 06/14/2016  . Left arm pain 06/14/2016  . 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 Mechanical complication of other vascular device, implant, and graft 08/10/2013  . End stage renal disease ([place]) 08/10/2013  . ESRD (end stage renal disease) ([place]) 06/07/2013  . Nausea and vomiting 06/07/2013  . SOB (shortness of breath) 05/10/2013  . Chest discomfort 11/27/2012  . Shortness of breath 11/27/2012  . Chronic hepatitis C without hepatic coma ([place]) 02/04/2012  . Secondary hyperparathyroidism (of renal origin) 02/04/2012  . Anemia associated with chronic renal failure 02/04/2012  . CAD (coronary atherosclerotic disease) 02/04/2012  . Pulmonary embolism ([place]) 07/20/2010  . CORONARY ARTERY DISEASE 02/23/2009  . HEMORRHOIDS, INTERNAL 02/23/2009  . GERD 02/23/2009  . ESRD on dialysis [facility]) 02/16/2009    Past Surgical History:  Procedure Laterality Date  . ABDOMINAL HERNIA REPAIR  ~ 2000 X 2; 2001 X1  . AV FISTULA PLACEMENT Right 2007   upper arm  . AV FISTULA REPAIR Right ~ 03/2016 & 04/2016  . BLADDER AUGMENTATION  1975  .  CORONARY ANGIOPLASTY WITH STENT PLACEMENT  1999  . EXTRACORPOREAL SHOCK WAVE LITHOTRIPSY    . HERNIA REPAIR    . INGUINAL HERNIA REPAIR Right 1975  . PERIPHERAL VASCULAR CATHETERIZATION N/A 06/22/2016   Procedure: Left Arm Venography;  Surgeon: Elam Dutch, MD;  Location: [facility];  Service: Cardiovascular;  Laterality: N/A;  . PERIPHERAL VASCULAR CATHETERIZATION N/A 06/22/2016   Procedure: A/V Fistulagram - Right Arm;  Surgeon: Elam Dutch, MD;  Location: [facility];   Service: Cardiovascular;  Laterality: N/A;  . PERIPHERAL VASCULAR CATHETERIZATION Right 06/22/2016   Procedure: Peripheral Vascular Balloon Angioplasty;  Surgeon: Elam Dutch, MD;  Location: [facility] [facility];  Service: Cardiovascular;  Laterality: Right;  arm fistula       Home Medications    Prior to Admission medications   Medication Sig Start Date End Date Taking? Authorizing Provider  calcium acetate (PHOSLO) 667 MG capsule Take 2 capsules by mouth 3 (three) times daily.    [provider]  Glecaprevir-Pibrentasvir (MAVYRET) 100-40 MG TABS Take 3 tablets by mouth daily. 08/15/16   Comer, Okey Regal, MD  hydrALAZINE (APRESOLINE) 50 MG tablet Take 50 mg by mouth 3 (three) times daily. 05/11/16   [provider]  labetalol (NORMODYNE) 200 MG tablet Take 200 mg by mouth 2 (two) times daily. 05/11/16   [provider]  multivitamin (RENA-VIT) TABS tablet Take 1 tablet by mouth at bedtime. 06/09/13   Johnson, Clanford L, MD  oxycodone (ROXICODONE) 30 MG immediate release tablet Take 30 mg by mouth 3 (three) times daily.    [provider]  Oxycodone HCl 10 MG TABS Take 10 mg by mouth 3 (three) times daily as needed.  05/22/16   [provider]    Family History Family History  Problem Relation Age of Onset  . Cancer Mother   . Diabetes Mother   . Hypertension Mother   . Deep vein thrombosis Sister   . Deep vein thrombosis Daughter     Social History Social History  Substance Use Topics  . Smoking status: Former Smoker    Packs/day: 1.00    Years: 5.00    Types: Cigarettes    Quit date: 05/28/1993  . Smokeless tobacco: Never Used  . Alcohol use No     Comment: 06/14/2016 "nothing since 1995"     Allergies   Heparin; Pork-derived products; Sulfa antibiotics; and Sulfonamide derivatives   Review of Systems Review of Systems  Skin: Positive for wound.  Allergic/Immunologic: Positive for immunocompromised state.  Neurological:  Negative for numbness.  All other systems reviewed and are negative.  Physical Exam Updated Vital Signs BP (!) 153/66 (BP Location: Right Arm)   Temp 97.7 F (36.5 C) (Oral)   Resp 18   Ht 5\' 3"  (1.6 m)   Wt 51.3 kg   SpO2 97%   BMI 20.03 kg/m   Physical Exam  Constitutional: He is oriented to person, place, and time. He appears well-developed and well-nourished. No distress.  HENT:  Head: Normocephalic and atraumatic.  Eyes: Conjunctivae and EOM are normal.  Neck: Neck supple. No tracheal deviation present.  Cardiovascular: Normal rate.   Pulses:      Radial pulses are 2+ on the right side.  Pulmonary/Chest: Effort normal. No respiratory distress.  Musculoskeletal: Normal range of motion.  FROM of the right thumb.  Neurological: He is alert and oriented to person, place, and time.  Skin: Skin is warm and dry. Laceration noted.  Avulsion laceration to the radial aspect of the right thumb that does not include the nail.  Psychiatric: He has a normal mood and affect. His behavior is normal.  Nursing note and vitals reviewed.  ED Treatments / Results  Labs (all labs ordered are listed, but only abnormal results are displayed) Labs Reviewed - No data to display  Radiology No results found.  Procedures Procedures (including critical care time)  7:52 PM: Applied Surgiseal to patient's right thumb laceration. Bleeding stopped, dressing applied.   DIAGNOSTIC STUDIES: Oxygen Saturation is 97% on RA, normal by my interpretation.    COORDINATION OF CARE: 7:23 PM Discussed treatment plan with pt at bedside and pt agreed to plan.  Medications Ordered in ED Medications - No data to display   Initial Impression / Assessment and Plan / ED Course  I have reviewed the triage vital signs and the nursing notes. Tetanus UTD. Discussed laceration care with pt and answered questions. Pt to f-u for wound check if any problems arise. Pt is hemodynamically stable with no complaints  prior to dc.     Final Clinical Impressions(s) / ED Diagnoses   Final diagnoses:  Laceration of right thumb without foreign body without damage to nail, initial encounter    New Prescriptions Discharge Medication List as of 10/09/2016  7:56 PM     I personally performed the services described in this documentation, which was scribed in my presence. The recorded information has been reviewed and is accurate.    Debroah Baller Zinc, [place] 10/09/16 2110    Virgel Manifold, MD 10/10/16 [phone]

## 2016-10-09 NOTE — Discharge Instructions (Signed)
Keep the area dry. Return if symptoms worsen or bleeding returns.

## 2016-10-09 NOTE — ED Triage Notes (Signed)
Per Pt, Pt cut his hand with a knife last night while cutting tomatoes. Pt was seen at dialysis today, and the wound was still bleeding. Pt was sent here to have the owund evaluated.

## 2016-10-16 ENCOUNTER — Telehealth: Payer: Self-pay | Admitting: Pharmacy Technician

## 2016-12-11 ENCOUNTER — Emergency Department (HOSPITAL_COMMUNITY)
Admission: EM | Admit: 2016-12-11 | Discharge: 2016-12-11 | Disposition: A | Discharge status: Home / Self Care | Payer: Medicare Other | Attending: Emergency Medicine | Admitting: Emergency Medicine

## 2016-12-11 ENCOUNTER — Emergency Department (HOSPITAL_COMMUNITY): Payer: Medicare Other

## 2016-12-11 ENCOUNTER — Encounter (HOSPITAL_COMMUNITY): Payer: Self-pay | Admitting: *Deleted

## 2016-12-11 DIAGNOSIS — I2510 Atherosclerotic heart disease of native coronary artery without angina pectoris: Secondary | ICD-10-CM | POA: Diagnosis not present

## 2016-12-11 DIAGNOSIS — Z79899 Other long term (current) drug therapy: Secondary | ICD-10-CM | POA: Diagnosis not present

## 2016-12-11 DIAGNOSIS — Z955 Presence of coronary angioplasty implant and graft: Secondary | ICD-10-CM | POA: Insufficient documentation

## 2016-12-11 DIAGNOSIS — Z992 Dependence on renal dialysis: Secondary | ICD-10-CM | POA: Diagnosis not present

## 2016-12-11 DIAGNOSIS — R0789 Other chest pain: Secondary | ICD-10-CM

## 2016-12-11 DIAGNOSIS — R1084 Generalized abdominal pain: Secondary | ICD-10-CM | POA: Diagnosis not present

## 2016-12-11 DIAGNOSIS — N186 End stage renal disease: Secondary | ICD-10-CM | POA: Insufficient documentation

## 2016-12-11 DIAGNOSIS — I120 Hypertensive chronic kidney disease with stage 5 chronic kidney disease or end stage renal disease: Secondary | ICD-10-CM | POA: Insufficient documentation

## 2016-12-11 DIAGNOSIS — R109 Unspecified abdominal pain: Secondary | ICD-10-CM

## 2016-12-11 DIAGNOSIS — R079 Chest pain, unspecified: Secondary | ICD-10-CM | POA: Diagnosis present

## 2016-12-11 DIAGNOSIS — Z87891 Personal history of nicotine dependence: Secondary | ICD-10-CM | POA: Insufficient documentation

## 2016-12-11 LAB — HEPATIC FUNCTION PANEL
ALBUMIN: 3.5 g/dL (ref 3.5–5.0)
ALK PHOS: 72 U/L (ref 38–126)
ALT: 7 U/L — AB (ref 17–63)
AST: 22 U/L (ref 15–41)
BILIRUBIN TOTAL: 0.6 mg/dL (ref 0.3–1.2)
Bilirubin, Direct: 0.2 mg/dL (ref 0.1–0.5)
Indirect Bilirubin: 0.4 mg/dL (ref 0.3–0.9)
TOTAL PROTEIN: 6.6 g/dL (ref 6.5–8.1)

## 2016-12-11 LAB — BASIC METABOLIC PANEL
ANION GAP: 12 (ref 5–15)
BUN: 60 mg/dL — ABNORMAL HIGH (ref 6–20)
CALCIUM: 9.5 mg/dL (ref 8.9–10.3)
CHLORIDE: 99 mmol/L — AB (ref 101–111)
CO2: 26 mmol/L (ref 22–32)
CREATININE: 8.57 mg/dL — AB (ref 0.61–1.24)
GFR calc Af Amer: 7 mL/min — ABNORMAL LOW (ref >60)
GFR calc non Af Amer: 6 mL/min — ABNORMAL LOW (ref >60)
GLUCOSE: 68 mg/dL (ref 65–99)
Potassium: 4.9 mmol/L (ref 3.5–5.1)
Sodium: 137 mmol/L (ref 135–145)

## 2016-12-11 LAB — CBC
HCT: 38.7 % — ABNORMAL LOW (ref 39.0–52.0)
HEMOGLOBIN: 12.2 g/dL — AB (ref 13.0–17.0)
MCH: 29.9 pg (ref 26.0–34.0)
MCHC: 31.5 g/dL (ref 30.0–36.0)
MCV: 94.9 fL (ref 78.0–100.0)
Platelets: 96 10*3/uL — ABNORMAL LOW (ref 150–400)
RBC: 4.08 MIL/uL — AB (ref 4.22–5.81)
RDW: 18 % — ABNORMAL HIGH (ref 11.5–15.5)
WBC: 3.6 10*3/uL — ABNORMAL LOW (ref 4.0–10.5)

## 2016-12-11 LAB — I-STAT TROPONIN, ED
TROPONIN I, POC: 0.15 ng/mL — AB (ref 0.00–0.08)
Troponin i, poc: 0.01 ng/mL (ref 0.00–0.08)

## 2016-12-11 LAB — LIPASE, BLOOD: LIPASE: 47 U/L (ref 11–51)

## 2016-12-11 MED ORDER — MORPHINE SULFATE (PF) 4 MG/ML IV SOLN
4.0000 mg | Freq: Once | INTRAVENOUS | Status: AC
  Administered 2016-12-11: 4 mg via INTRAVENOUS
  Filled 2016-12-11 (×2): qty 1

## 2016-12-11 NOTE — ED Notes (Signed)
Patient Alert and oriented X4. Stable and ambulatory. Patient verbalized understanding of the discharge instructions.  Patient belongings were taken by the patient.  

## 2016-12-11 NOTE — ED Notes (Signed)
Attempted x 2 to restart IV unsuccessful. IV team consulted.

## 2016-12-11 NOTE — ED Notes (Signed)
EDP at bedside  

## 2016-12-11 NOTE — ED Provider Notes (Signed)
[facility] DEPT Provider Note   CSN: [id number] Arrival date & time: 12/11/16  1252     History   Chief Complaint Chief Complaint  Patient presents with  . Chest Pain    HPI Logan Martin is a 61 y.o. male.  61yo M w/ PMH including ESRD on HD, PVD, PE, hep C who p/w chest and abdominal pain. PT began having dull, central chest pain with mild shortness of breath. The pain moved down into his central abdomen and has remained there. Pain is constant, not associated with any nausea, vomiting, or diarrhea. He took 325mg  ASA at home. He is compliant with dialysis, was supposed to go today but could not because he didn't feel well. He denies any fevers, cough/cold symptoms, or recent illness. He reports no chest pain or shortness of breath since the pain moved into his abdomen.   The history is provided by the patient.  Chest Pain      Past Medical History:  Diagnosis Date  . Alcohol abuse    quit date in 1995  . Anemia   . Chronic low back pain   . Cocaine abuse in remission    Quit date in 1995  . 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 Started HD in [place].  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  On [facility] home hemo since 2010. Using RUA AVF.    [name] ESRD on dialysis ([place])    "TTS; [place]" (06/14/2016)  . Hepatitis C   . History of blood transfusion 2007 X 1  . History of kidney stones   . Hypertension   . Peripheral vascular disease ([place])   . Pulmonary embolism [facility]) Feb 2012   Treated with coumadin x 1 year  . Renal insufficiency     Patient Active Problem List   Diagnosis Date Noted  . Pancytopenia ([place]) 06/14/2016  . Left arm pain 06/14/2016  . 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 Mechanical complication of other vascular device, implant, and graft 08/10/2013  . End stage renal disease ([place]) 08/10/2013  . ESRD (end stage renal disease) ([place]) 06/07/2013  . Nausea and vomiting  06/07/2013  . SOB (shortness of breath) 05/10/2013  . Chest discomfort 11/27/2012  . Shortness of breath 11/27/2012  . Chronic hepatitis C without hepatic coma ([place]) 02/04/2012  . Secondary hyperparathyroidism (of renal origin) 02/04/2012  . Anemia associated with chronic renal failure 02/04/2012  . CAD (coronary atherosclerotic disease) 02/04/2012  . Pulmonary embolism ([place]) 07/20/2010  . CORONARY ARTERY DISEASE 02/23/2009  . HEMORRHOIDS, INTERNAL 02/23/2009  . GERD 02/23/2009  . ESRD on dialysis [facility]) 02/16/2009    Past Surgical History:  Procedure Laterality Date  . ABDOMINAL HERNIA REPAIR  ~ 2000 X 2; 2001 X1  . AV FISTULA PLACEMENT Right 2007   upper arm  . AV FISTULA REPAIR Right ~ 03/2016 & 04/2016  . BLADDER AUGMENTATION  1975  . CORONARY ANGIOPLASTY WITH STENT PLACEMENT  1999  . EXTRACORPOREAL SHOCK WAVE LITHOTRIPSY    . HERNIA REPAIR    . INGUINAL HERNIA REPAIR Right 1975  . PERIPHERAL VASCULAR CATHETERIZATION N/A 06/22/2016   Procedure: Left Arm Venography;  Surgeon: Elam Dutch, MD;  Location: [facility];  Service: Cardiovascular;  Laterality: N/A;  . PERIPHERAL VASCULAR CATHETERIZATION N/A 06/22/2016   Procedure: A/V Fistulagram - Right Arm;  Surgeon: Elam Dutch, MD;  Location: [facility];  Service: Cardiovascular;  Laterality: N/A;  . PERIPHERAL VASCULAR CATHETERIZATION Right 06/22/2016   Procedure: Peripheral Vascular Balloon Angioplasty;  Surgeon: Elam Dutch, MD;  Location: [facility];  Service: Cardiovascular;  Laterality: Right;  arm fistula       Home Medications    Prior to Admission medications   Medication Sig Start Date End Date Taking? Authorizing Provider  amLODipine (NORVASC) 10 MG tablet Take 10 mg by mouth daily.   Yes [provider]  calcium acetate (PHOSLO) 667 MG capsule Take 2 capsules by mouth 3 (three) times daily.   Yes [provider]  Glecaprevir-Pibrentasvir (MAVYRET) 100-40 MG  TABS Take 3 tablets by mouth daily. 08/15/16  Yes Comer, Okey Regal, MD  hydrALAZINE (APRESOLINE) 50 MG tablet Take 50 mg by mouth 3 (three) times daily. 05/11/16  Yes [provider]  labetalol (NORMODYNE) 200 MG tablet Take 200 mg by mouth 2 (two) times daily. 05/11/16  Yes [provider]  oxycodone (ROXICODONE) 30 MG immediate release tablet Take 30 mg by mouth 3 (three) times daily.   Yes [provider]  Oxycodone HCl 10 MG TABS Take 10 mg by mouth 3 (three) times daily as needed.  05/22/16  Yes [provider]  multivitamin (RENA-VIT) TABS tablet Take 1 tablet by mouth at bedtime. Patient not taking: Reported on 12/11/2016 06/09/13   Murlean Iba, MD    Family History Family History  Problem Relation Age of Onset  . Cancer Mother   . Diabetes Mother   . Hypertension Mother   . Deep vein thrombosis Sister   . Deep vein thrombosis Daughter     Social History Social History  Substance Use Topics  . Smoking status: Former Smoker    Packs/day: 1.00    Years: 5.00    Types: Cigarettes    Quit date: 05/28/1993  . Smokeless tobacco: Never Used  . Alcohol use No     Comment: 06/14/2016 "nothing since 1995"     Allergies   Heparin; Pork-derived products; Sulfa antibiotics; and Sulfonamide derivatives   Review of Systems Review of Systems  Cardiovascular: Positive for chest pain.   All other systems reviewed and are negative except that which was mentioned in HPI   Physical Exam Updated Vital Signs BP (!) 170/77   Pulse 76   Resp (!) 21   Ht 5\' 4"  (1.626 m)   Wt 51 kg (112 lb 7 oz)   SpO2 98%   BMI 19.30 kg/m   Physical Exam  Constitutional: He is oriented to person, place, and time. He appears well-developed. No distress.  Frail, chronically ill appearing  HENT:  Head: Normocephalic and atraumatic.  Moist mucous membranes  Eyes: Pupils are equal, round, and reactive to light. Conjunctivae are normal.  Neck: Neck supple.    Cardiovascular: Normal rate, regular rhythm and normal heart sounds.   Pulmonary/Chest: Effort normal and breath sounds normal.  Abdominal: Soft. Bowel sounds are normal. He exhibits no distension. There is tenderness (central, over abdominal scar).  Musculoskeletal: He exhibits no edema.  Neurological: He is alert and oriented to person, place, and time.  Fluent speech  Skin: Skin is warm and dry.  Psychiatric: He has a normal mood and affect. Judgment normal.  Nursing note and vitals reviewed.    ED Treatments / Results  Labs (all labs ordered are listed, but only abnormal results are displayed) Labs Reviewed  BASIC METABOLIC PANEL - Abnormal; Notable for the following:  Result Value   Chloride 99 (*)    BUN 60 (*)    Creatinine, Ser 8.57 (*)    GFR calc non Af Amer 6 (*)    GFR calc Af Amer 7 (*)    All other components within normal limits  CBC - Abnormal; Notable for the following:    WBC 3.6 (*)    RBC 4.08 (*)    Hemoglobin 12.2 (*)    HCT 38.7 (*)    RDW 18.0 (*)    Platelets 96 (*)    All other components within normal limits  HEPATIC FUNCTION PANEL - Abnormal; Notable for the following:    ALT 7 (*)    All other components within normal limits  I-STAT TROPOININ, ED - Abnormal; Notable for the following:    Troponin i, poc 0.15 (*)    All other components within normal limits  LIPASE, BLOOD  I-STAT TROPOININ, ED    EKG  EKG Interpretation  Date/Time:  Tuesday December 11 2016 12:57:39 EDT Ventricular Rate:  74 PR Interval:    QRS Duration: 89 QT Interval:  438 QTC Calculation: 486 R Axis:   -69 Text Interpretation:  Sinus rhythm Left anterior fascicular block Anterior infarct, old No significant change since last tracing Confirmed by Theotis Burrow [phone]) on 12/11/2016 1:01:15 PM       Radiology Ct Abdomen Pelvis Wo Contrast  Result Date: 12/11/2016 CLINICAL DATA:  61 year old male with central abdominal pain and tenderness over ventral hernia  repair site EXAM: CT ABDOMEN AND PELVIS WITHOUT CONTRAST TECHNIQUE: Multidetector CT imaging of the abdomen and pelvis was performed following the standard protocol without IV contrast. COMPARISON:  Prior CT scan of the abdomen and pelvis 06/01/2016 FINDINGS: Lower chest: Small right and trace left pleural effusions with associated bilateral lower lobe atelectasis. Incompletely visualized marked cardiomegaly. Small pericardial effusion has decreased in volume compared to 06/01/2016. Unremarkable visualized distal thoracic esophagus. Stable coronary artery calcifications. Hepatobiliary: Numerous circumscribed low-attenuation cystic lesions scattered throughout the liver consistent with known hepatic cysts. No new lesion or biliary ductal dilatation. Pancreas: Unremarkable. No pancreatic ductal dilatation or surrounding inflammatory changes. Spleen: Normal in size without focal abnormality. Adrenals/Urinary Tract: Innumerable cysts scattered throughout both kidneys are incompletely evaluated in the absence of intravenous contrast material but grossly unchanged compared to prior imaging. Findings are consistent with the stigmata of polycystic kidney disease. Persistent marked heterogeneous enlargement of the bladder presently measuring 8.4 by 14 cm which is very similar compared to the prior imaging from January of 2018. The internal contents of the bladder remain heterogeneous with areas of high and intermediate attenuation. Stomach/Bowel: Surgical changes of prior partial right colectomy. The anastomosis appears patent. No evidence of obstruction. Vascular/Lymphatic: Limited evaluation in the absence of intravenous contrast. Extensive atherosclerotic vascular calcifications. No obvious aneurysm. Reproductive: Prostate is unremarkable. Other: No evidence of recurrent abdominal wall hernia. No ascites. Mild stranding throughout the minimal subcutaneous and intra- abdominal fat consistent with edema. Musculoskeletal:  Diffuse osteosclerosis likely reflecting renal osteodystrophy. IMPRESSION: 1. Similar appearance of the bladder compared to 06/01/2016 but progressive compared to 03/10/2012. The bladder remains enlarged, lobular and full of heterogeneous high to intermediate attenuation material. This likely represents continued accumulation of chronic thrombus versus inspissated mucinous debris. 2. No definite new or acute intra-abdominal process. 3. Small right and trace left pleural effusions with associated lower lobe atelectasis. 4. Persistent but improved pericardial effusion compared to 06/01/2016. 5. Marked cardiomegaly, unchanged. 6. Stigmata of polycystic kidney disease with numerous  bilateral renal and hepatic cysts. All cystic structures are incompletely evaluated in the absence of intravenous contrast. 7.  Aortic Atherosclerosis (ICD10-170.0) 8. Renal osteodystrophy. 9. Additional ancillary findings as above without significant interval change. Electronically Signed   By: Jacqulynn Cadet M.D.   On: 12/11/2016 15:44   Dg Chest 2 View  Result Date: 12/11/2016 CLINICAL DATA:  Chest pain and shortness of breath beginning today. EXAM: CHEST  2 VIEW COMPARISON:  CT January 2018.  Radiography August 2015. FINDINGS: Cardiac silhouette is chronically enlarged. Venous stent in the right superior mediastinum as seen previously. There are small bilateral pleural effusions. There is abnormal interstitial pulmonary density most consistent with mild edema. More prominent markings at the bases could indicate coexistent pneumonia, but the findings may simply represent fluid overload/congestive heart failure. No acute bone finding. IMPRESSION: Cardiomegaly. Interstitial edema. Small effusions. Basilar pneumonia not excluded. Electronically Signed   By: Nelson Chimes M.D.   On: 12/11/2016 13:31    Procedures Procedures (including critical care time)  Medications Ordered in ED Medications  morphine 4 MG/ML injection 4 mg (4 mg  Intravenous Given 12/11/16 1536)     Initial Impression / Assessment and Plan / ED Course  I have reviewed the triage vital signs and the nursing notes.  Pertinent labs & imaging results that were available during my care of the patient were reviewed by me and considered in my medical decision making (see chart for details).     PT w/ dull CP that moved into abdomen and has continued in abdomen throughout today. He was nontoxic on exam with reassuring vital signs, afebrile. He had abdominal tenderness of his central abdomen over his previous surgical scar from hernia repair. His lab work showed potassium 4.9, glucose 68, normal LFTs and lipase. WBC 3.6, hemoglobin 12.2. His initial troponin was 0.15, difficult to interpret in the setting of renal failure. Repeat troponin 3 hours later was unremarkable. Obtained CT of abdomen and given his history of previous surgery. CT showed no acute findings to explain his abdominal pain. Because of his abdominal tenderness on exam and the fact that he complains only of abdominal pain currently, I feel his chest pain was unlikely to be cardiac or pulmonary in etiology. He was well-appearing on reexamination. He did miss his dialysis session today. I have instructed him to contact his clinic tomorrow for rescheduling but feel that he is safe for discharge today as he is not requiring oxygen and has normal potassium. I discussed supportive measures as well as return precautions. Patient voiced understanding and was discharged in satisfactory condition. Final Clinical Impressions(s) / ED Diagnoses   Final diagnoses:  None    New Prescriptions New Prescriptions   No medications on file     Gerald Honea, Wenda Overland, MD 12/11/16 1734

## 2016-12-11 NOTE — ED Triage Notes (Signed)
Per EMS- pt is from home and began having chest pain this morning at around 0930. Pt states that the pain was substernal and dull with SOB. Pt now reports abdominal pain and distention. Pt received 324mg  of asprin en route. 22g left forearm

## 2016-12-11 NOTE — ED Notes (Signed)
Patient presents to ed c/o chest pain with radiation into his abd. Bowel sounds present denies nausea and vomiting states he has loose stools of which is normal for him. He took asa 324 mg at home. States he was suppose to go to dialysis today however didn't feel like going because of his abd. Pain.

## 2016-12-11 NOTE — ED Notes (Signed)
Patient returned from XRAY 

## 2016-12-12 ENCOUNTER — Telehealth: Payer: Self-pay | Admitting: *Deleted

## 2016-12-12 NOTE — Telephone Encounter (Signed)
Called patient and notified him of appointment for ultrasound at [facility] on 12/26/16 at 8:45 AM. Nothing to eat or drink after midnight. Myrtis Hopping

## 2016-12-26 ENCOUNTER — Ambulatory Visit (HOSPITAL_COMMUNITY)
Admission: RE | Admit: 2016-12-26 | Discharge: 2016-12-26 | Disposition: A | Discharge status: Home / Self Care | Payer: Medicare Other | Source: Ambulatory Visit | Attending: Internal Medicine | Admitting: Internal Medicine

## 2016-12-26 ENCOUNTER — Other Ambulatory Visit: Payer: Self-pay | Admitting: Pharmacist

## 2016-12-26 DIAGNOSIS — B182 Chronic viral hepatitis C: Secondary | ICD-10-CM | POA: Insufficient documentation

## 2016-12-26 DIAGNOSIS — Z992 Dependence on renal dialysis: Secondary | ICD-10-CM | POA: Insufficient documentation

## 2016-12-26 DIAGNOSIS — N186 End stage renal disease: Secondary | ICD-10-CM | POA: Insufficient documentation

## 2016-12-26 DIAGNOSIS — J90 Pleural effusion, not elsewhere classified: Secondary | ICD-10-CM | POA: Diagnosis not present

## 2016-12-26 DIAGNOSIS — K7689 Other specified diseases of liver: Secondary | ICD-10-CM | POA: Diagnosis not present

## 2017-01-04 ENCOUNTER — Emergency Department (HOSPITAL_COMMUNITY): Payer: Medicare Other

## 2017-01-04 ENCOUNTER — Emergency Department (HOSPITAL_COMMUNITY)
Admission: EM | Admit: 2017-01-04 | Discharge: 2017-01-04 | Disposition: A | Discharge status: Home / Self Care | Payer: Medicare Other | Attending: Emergency Medicine | Admitting: Emergency Medicine

## 2017-01-04 ENCOUNTER — Encounter (HOSPITAL_COMMUNITY): Payer: Self-pay | Admitting: *Deleted

## 2017-01-04 DIAGNOSIS — Z87891 Personal history of nicotine dependence: Secondary | ICD-10-CM | POA: Diagnosis not present

## 2017-01-04 DIAGNOSIS — Z79899 Other long term (current) drug therapy: Secondary | ICD-10-CM | POA: Insufficient documentation

## 2017-01-04 DIAGNOSIS — J90 Pleural effusion, not elsewhere classified: Secondary | ICD-10-CM | POA: Diagnosis not present

## 2017-01-04 DIAGNOSIS — I2510 Atherosclerotic heart disease of native coronary artery without angina pectoris: Secondary | ICD-10-CM | POA: Diagnosis not present

## 2017-01-04 DIAGNOSIS — Z992 Dependence on renal dialysis: Secondary | ICD-10-CM | POA: Insufficient documentation

## 2017-01-04 DIAGNOSIS — I1311 Hypertensive heart and chronic kidney disease without heart failure, with stage 5 chronic kidney disease, or end stage renal disease: Secondary | ICD-10-CM | POA: Diagnosis not present

## 2017-01-04 DIAGNOSIS — R0602 Shortness of breath: Secondary | ICD-10-CM

## 2017-01-04 DIAGNOSIS — N186 End stage renal disease: Secondary | ICD-10-CM | POA: Diagnosis not present

## 2017-01-04 LAB — I-STAT TROPONIN, ED
TROPONIN I, POC: 0.04 ng/mL (ref 0.00–0.08)
Troponin i, poc: 0.01 ng/mL (ref 0.00–0.08)

## 2017-01-04 LAB — CBC
HCT: 33.4 % — ABNORMAL LOW (ref 39.0–52.0)
Hemoglobin: 10.9 g/dL — ABNORMAL LOW (ref 13.0–17.0)
MCH: 29.9 pg (ref 26.0–34.0)
MCHC: 32.6 g/dL (ref 30.0–36.0)
MCV: 91.8 fL (ref 78.0–100.0)
Platelets: 99 10*3/uL — ABNORMAL LOW (ref 150–400)
RBC: 3.64 MIL/uL — ABNORMAL LOW (ref 4.22–5.81)
RDW: 16.7 % — ABNORMAL HIGH (ref 11.5–15.5)
WBC: 3.5 10*3/uL — ABNORMAL LOW (ref 4.0–10.5)

## 2017-01-04 LAB — BASIC METABOLIC PANEL
Anion gap: 13 (ref 5–15)
BUN: 22 mg/dL — AB (ref 6–20)
CALCIUM: 9.4 mg/dL (ref 8.9–10.3)
CO2: 27 mmol/L (ref 22–32)
Chloride: 101 mmol/L (ref 101–111)
Creatinine, Ser: 5.48 mg/dL — ABNORMAL HIGH (ref 0.61–1.24)
GFR calc Af Amer: 12 mL/min — ABNORMAL LOW (ref >60)
GFR, EST NON AFRICAN AMERICAN: 10 mL/min — AB (ref >60)
GLUCOSE: 77 mg/dL (ref 65–99)
POTASSIUM: 4 mmol/L (ref 3.5–5.1)
SODIUM: 141 mmol/L (ref 135–145)

## 2017-01-04 MED ORDER — IOPAMIDOL (ISOVUE-370) INJECTION 76%
INTRAVENOUS | Status: AC
  Administered 2017-01-04: 100 mL
  Filled 2017-01-04: qty 100

## 2017-01-04 NOTE — ED Notes (Signed)
Pt daughter endorses family hx of DVT

## 2017-01-04 NOTE — ED Triage Notes (Signed)
PT states yesterday started having exhaustion with minimal exertion.  Pt has congested cough.  Pt is a HD patient.  Pt states woke up this am because he could not breath. A long as he leans forward he feels normal. Pt states feels something on both sides of neck he can feel.  Last HD was yesterday and completed treatment

## 2017-01-04 NOTE — ED Provider Notes (Signed)
[facility] DEPT Provider Note   CSN: [id number] Arrival date & time: 01/04/17  1213     History   Chief Complaint Chief Complaint  Patient presents with  . Shortness of Breath    HPI Logan Martin is a 61 y.o. male. 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 He has had similar symptoms in the past. Today he describes worsening shortness of breath after dialysis yesterday that seems to be exertional. He describes up to 4 episodes with dyspnea on exertion in the past month. When asked about episodes over the past 6 months, he reports he used to have more severe symptoms. 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 He denies any chest pain, palpitations, nausea, vomiting, lightheadedness course syncopal events.  HPI  Past Medical History:  Diagnosis Date  . Alcohol abuse    quit date in 1995  . Anemia   . Chronic low back pain   . Cocaine abuse in remission    Quit date in 1995  . 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 Started HD in [place], [place].  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  On NxStage home hemo since 2010. Using RUA AVF.    [name] ESRD on dialysis ([place])    "TTS; [place]" (06/14/2016)  . Hepatitis C   . History of blood transfusion 2007 X 1  . History of kidney stones   . Hypertension   . Peripheral vascular disease ([place])   . Pulmonary embolism [facility]) Feb 2012   Treated with coumadin x 1 year  . Renal insufficiency     Patient Active Problem List   Diagnosis Date Noted  . Pancytopenia ([employer]) 06/14/2016  . Left arm pain 06/14/2016  . 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 Mechanical complication of other vascular device, implant, and graft 08/10/2013  . End stage renal disease ([place]) 08/10/2013  . ESRD (end stage renal disease) ([place]) 06/07/2013  . Nausea and vomiting 06/07/2013  . SOB (shortness of  breath) 05/10/2013  . Chest discomfort 11/27/2012  . Shortness of breath 11/27/2012  . Chronic hepatitis C without hepatic coma ([place]) 02/04/2012  . Secondary hyperparathyroidism (of renal origin) 02/04/2012  . Anemia associated with chronic renal failure 02/04/2012  . CAD (coronary atherosclerotic disease) 02/04/2012  . Pulmonary embolism ([place]) 07/20/2010  . CORONARY ARTERY DISEASE 02/23/2009  . HEMORRHOIDS, INTERNAL 02/23/2009  . GERD 02/23/2009  . ESRD on dialysis [facility]) 02/16/2009    Past Surgical History:  Procedure Laterality Date  . ABDOMINAL HERNIA REPAIR  ~ 2000 X 2; 2001 X1  . AV FISTULA PLACEMENT Right 2007   upper arm  . AV FISTULA REPAIR Right ~ 03/2016 & 04/2016  . BLADDER AUGMENTATION  1975  . CORONARY ANGIOPLASTY WITH STENT PLACEMENT  1999  . EXTRACORPOREAL SHOCK WAVE LITHOTRIPSY    . HERNIA REPAIR    . INGUINAL HERNIA REPAIR Right 1975  . PERIPHERAL VASCULAR CATHETERIZATION N/A 06/22/2016   Procedure: Left Arm Venography;  Surgeon: Elam Dutch, MD;  Location: [facility];  Service: Cardiovascular;  Laterality: N/A;  . PERIPHERAL VASCULAR CATHETERIZATION N/A 06/22/2016   Procedure: A/V Fistulagram - Right Arm;  Surgeon: Elam Dutch, MD;  Location: [facility];  Service: Cardiovascular;  Laterality: N/A;  . PERIPHERAL VASCULAR CATHETERIZATION Right 06/22/2016   Procedure: Peripheral Vascular Balloon Angioplasty;  Surgeon: Elam Dutch, MD;  Location: [facility];  Service: Cardiovascular;  Laterality: Right;  arm fistula       Home Medications    Prior to Admission medications   Medication Sig Start Date End Date Taking? Authorizing Provider  calcium acetate (PHOSLO) 667 MG capsule Take 2 capsules by mouth 3 (three) times daily.   Yes [provider]  Glecaprevir-Pibrentasvir (MAVYRET) 100-40 MG TABS Take 3 tablets by mouth daily. 08/15/16  Yes Comer, Okey Regal, MD  hydrALAZINE (APRESOLINE) 50 MG tablet Take 50 mg by mouth  3 (three) times daily. 05/11/16  Yes [provider]  labetalol (NORMODYNE) 200 MG tablet Take 200 mg by mouth 2 (two) times daily. 05/11/16  Yes [provider]  oxycodone (ROXICODONE) 30 MG immediate release tablet Take 30 mg by mouth 3 (three) times daily.   Yes [provider]  Oxycodone HCl 10 MG TABS Take 10 mg by mouth 3 (three) times daily as needed.  05/22/16  Yes [provider]  multivitamin (RENA-VIT) TABS tablet Take 1 tablet by mouth at bedtime. Patient not taking: Reported on 12/11/2016 06/09/13   Murlean Iba, MD    Family History Family History  Problem Relation Age of Onset  . Cancer Mother   . Diabetes Mother   . Hypertension Mother   . Deep vein thrombosis Sister   . Deep vein thrombosis Daughter     Social History Social History  Substance Use Topics  . Smoking status: Former Smoker    Packs/day: 1.00    Years: 5.00    Types: Cigarettes    Quit date: 05/28/1993  . Smokeless tobacco: Never Used  . Alcohol use No     Comment: 06/14/2016 "nothing since 1995"     Allergies   Heparin; Pork-derived products; Sulfa antibiotics; and Sulfonamide derivatives   Review of Systems Review of Systems  Constitutional: Negative for chills and fever.  HENT: Negative for ear pain and sore throat.   Eyes: Negative for pain and visual disturbance.  Respiratory: Positive for shortness of breath. Negative for cough.   Cardiovascular: Negative for chest pain and palpitations.  Gastrointestinal: Negative for abdominal pain and vomiting.  Genitourinary: Negative for dysuria and hematuria.  Musculoskeletal: Negative for arthralgias and back pain.  Skin: Negative for color change and rash.  Neurological: Negative for seizures and syncope.  All other systems reviewed and are negative.    Physical Exam Updated Vital Signs BP (!) 185/82   Pulse 87   Temp (!) 97.4 F (36.3 C) (Oral)   Resp (!) 22   SpO2 93%   Physical Exam    Constitutional: He appears well-developed and well-nourished.  HENT:  Head: Normocephalic and atraumatic.  Eyes: Conjunctivae are normal.  Neck: Neck supple.  Cardiovascular: Normal rate and regular rhythm.   No murmur heard. Pulmonary/Chest: Effort normal. No respiratory distress. He has rales (mild bibasilar rales).  Abdominal: Soft. There is no tenderness.  Musculoskeletal: He exhibits no edema.  Neurological: He is alert.  Skin: Skin is warm and dry.  Psychiatric: He has a normal mood and affect.  Nursing note and vitals reviewed.    ED Treatments / Results  Labs (all labs ordered are listed, but only abnormal results are displayed) Labs Reviewed  BASIC METABOLIC PANEL - Abnormal; Notable for the following:       Result Value   BUN 22 (*)    Creatinine, Ser 5.48 (*)    GFR calc non Af  Amer 10 (*)    GFR calc Af Amer 12 (*)    All other components within normal limits  CBC - Abnormal; Notable for the following:    WBC 3.5 (*)    RBC 3.64 (*)    Hemoglobin 10.9 (*)    HCT 33.4 (*)    RDW 16.7 (*)    Platelets 99 (*)    All other components within normal limits  I-STAT TROPONIN, ED  I-STAT TROPONIN, ED    EKG  EKG Interpretation  Date/Time:  Friday January 04 2017 12:14:16 EDT Ventricular Rate:  83 PR Interval:  176 QRS Duration: 86 QT Interval:  422 QTC Calculation: 495 R Axis:   38 Text Interpretation:  Normal sinus rhythm Nonspecific T wave abnormality Confirmed by Lajean Saver [phone]) on 01/04/2017 3:49:33 PM       Radiology Dg Chest 2 View  Result Date: 01/04/2017 CLINICAL DATA:  Cough and shortness of breath. EXAM: CHEST  2 VIEW COMPARISON:  12/11/2016 FINDINGS: The cardio pericardial silhouette is enlarged. There is pulmonary vascular congestion with a suggestion of interstitial edema t pulmonary edema. Underlying chronic interstitial changes again noted with similar appearance of bibasilar airspace disease. The visualized bony structures of the  thorax are intact. Vascular stent device overlies the upper right mediastinum/subclavian region. IMPRESSION: 1. Similar study with cardiomegaly vascular congestion and probable component of interstitial edema. 2. Opacity in the lung bases may reflect atelectasis or scarring, but pneumonia not excluded. Electronically Signed   By: Misty Stanley M.D.   On: 01/04/2017 12:56   Ct Angio Chest Pe W And/or Wo Contrast  Result Date: 01/04/2017 CLINICAL DATA:  Shortness of breath EXAM: CT ANGIOGRAPHY CHEST WITH CONTRAST TECHNIQUE: Multidetector CT imaging of the chest was performed using the standard protocol during bolus administration of intravenous contrast. Multiplanar CT image reconstructions and MIPs were obtained to evaluate the vascular anatomy. CONTRAST:  100 mL Isovue 370 COMPARISON:  Chest CT 06/15/2016 FINDINGS: Cardiovascular: Contrast injection is sufficient to demonstrate satisfactory opacification of the pulmonary arteries to the segmental level. There is no pulmonary embolus. The main pulmonary artery is enlarged, measuring 3.6 cm at the bifurcation. There is no CT evidence of acute right heart strain. The visualized aorta is normal. Heart size is markedly enlarged with no pericardial effusion. There is a stent within the right brachiocephalic vein. Mediastinum/Nodes: No mediastinal, hilar or axillary lymphadenopathy. The visualized thyroid and thoracic esophageal course are unremarkable. Lungs/Pleura: Medium-sized right and small left pleural effusions. Mild pulmonary edema and bibasilar atelectasis. Upper Abdomen: Contrast bolus timing is not optimized for evaluation of the abdominal organs. Polycystic liver and kidneys. The abdomen is otherwise poorly visualized. Musculoskeletal: No chest wall abnormality. No acute or significant osseous findings. Review of the MIP images confirms the above findings. IMPRESSION: 1. No pulmonary embolus. 2. Massive cardiomegaly with mild pulmonary edema and bilateral  pleural effusions, right larger than left. Findings suggest congestive heart failure. 3. Polycystic liver and kidneys. Electronically Signed   By: Ulyses Jarred M.D.   On: 01/04/2017 18:01    Procedures Procedures (including critical care time)  Medications Ordered in ED Medications  iopamidol (ISOVUE-370) 76 % injection (100 mLs  Contrast Given 01/04/17 1732)     Initial Impression / Assessment and Plan / ED Course  I have reviewed the triage vital signs and the nursing notes.  Pertinent labs & imaging results that were available during my care of the patient were reviewed by me and considered in my medical  decision making (see chart for details).     Patient is a 61 year old male ith history of ESRD on HD Tu/Th/Sat,  PVD, PE, hep C who presents with dyspnea on exertion after dialysis yesterday. Patient arrived hemodynamically stable, in no acute distress. Hypertensive. Exam as above, significant for bibasilar rails. Patient otherwise comfortable appearing.  Labs and imaging obtained. Labs stable without any emergent need for hemodialysis. Troponin negative 2. Chest x-ray showing vascular congestion. Given poor historian and previous history of PE not on anticoagulation, CT PE obtained which was negative for pulmonary embolism, however did show bilateral lateral pleural effusions.  Given patient's stable appearance, and benign workup, he is stable for discharge with close follow-up with his dialysis for tomorrow. Also advised he follow-up with his vascular surgeon given his history of SVC syndome and patient's concern for increased facial swelling. I do not appreciate any facial or neck swelling on my exam. I suspect his symptoms are secondary to pleural effusions and fluid offloading during dialysis yesterday. Return precautions discussed. Patient agreement with plan at time of discharge.  Patient and plan of care discussed with Attending physician, Dr. Ashok Cordia.    Final Clinical  Impressions(s) / ED Diagnoses   Final diagnoses:  SOB (shortness of breath)  Pleural effusion    New Prescriptions Discharge Medication List as of 01/04/2017  7:27 PM       Arnetha Massy, MD 01/05/17 4417    Lajean Saver, MD 01/05/17 1515

## 2017-01-04 NOTE — ED Notes (Signed)
Patient transported to CT 

## 2017-01-04 NOTE — ED Notes (Signed)
ED Provider at bedside. 

## 2017-01-04 NOTE — Discharge Instructions (Signed)
Your work up today was reassuring. Follow up for dialysis tomorrow as planned. Please return to ED with any worsening of symptoms.

## 2017-01-15 ENCOUNTER — Emergency Department (HOSPITAL_COMMUNITY)
Admission: EM | Admit: 2017-01-15 | Discharge: 2017-01-16 | Disposition: A | Discharge status: Home / Self Care | Payer: Medicare Other | Attending: Emergency Medicine | Admitting: Emergency Medicine

## 2017-01-15 ENCOUNTER — Encounter (HOSPITAL_COMMUNITY): Payer: Self-pay | Admitting: Emergency Medicine

## 2017-01-15 DIAGNOSIS — N139 Obstructive and reflux uropathy, unspecified: Secondary | ICD-10-CM

## 2017-01-15 DIAGNOSIS — N186 End stage renal disease: Secondary | ICD-10-CM | POA: Insufficient documentation

## 2017-01-15 DIAGNOSIS — Z955 Presence of coronary angioplasty implant and graft: Secondary | ICD-10-CM | POA: Insufficient documentation

## 2017-01-15 DIAGNOSIS — Z79899 Other long term (current) drug therapy: Secondary | ICD-10-CM | POA: Diagnosis not present

## 2017-01-15 DIAGNOSIS — N3000 Acute cystitis without hematuria: Secondary | ICD-10-CM | POA: Diagnosis not present

## 2017-01-15 DIAGNOSIS — Z87891 Personal history of nicotine dependence: Secondary | ICD-10-CM | POA: Insufficient documentation

## 2017-01-15 DIAGNOSIS — Z992 Dependence on renal dialysis: Secondary | ICD-10-CM | POA: Insufficient documentation

## 2017-01-15 DIAGNOSIS — I2510 Atherosclerotic heart disease of native coronary artery without angina pectoris: Secondary | ICD-10-CM | POA: Insufficient documentation

## 2017-01-15 DIAGNOSIS — I120 Hypertensive chronic kidney disease with stage 5 chronic kidney disease or end stage renal disease: Secondary | ICD-10-CM | POA: Insufficient documentation

## 2017-01-15 DIAGNOSIS — F1410 Cocaine abuse, uncomplicated: Secondary | ICD-10-CM | POA: Insufficient documentation

## 2017-01-15 DIAGNOSIS — R350 Frequency of micturition: Secondary | ICD-10-CM | POA: Diagnosis present

## 2017-01-15 LAB — URINALYSIS, ROUTINE W REFLEX MICROSCOPIC
GLUCOSE, UA: 250 mg/dL — AB
Ketones, ur: 15 mg/dL — AB
Nitrite: POSITIVE — AB
PH: 8.5 — AB (ref 5.0–8.0)
Protein, ur: >300 mg/dL — AB
Specific Gravity, Urine: 1.015 (ref 1.005–1.030)

## 2017-01-15 LAB — CBC
HCT: 30.2 % — ABNORMAL LOW (ref 39.0–52.0)
HEMOGLOBIN: 9.7 g/dL — AB (ref 13.0–17.0)
MCH: 28.8 pg (ref 26.0–34.0)
MCHC: 32.1 g/dL (ref 30.0–36.0)
MCV: 89.6 fL (ref 78.0–100.0)
Platelets: 73 10*3/uL — ABNORMAL LOW (ref 150–400)
RBC: 3.37 MIL/uL — ABNORMAL LOW (ref 4.22–5.81)
RDW: 17.6 % — ABNORMAL HIGH (ref 11.5–15.5)
WBC: 2.9 10*3/uL — AB (ref 4.0–10.5)

## 2017-01-15 LAB — URINALYSIS, MICROSCOPIC (REFLEX)

## 2017-01-15 LAB — COMPREHENSIVE METABOLIC PANEL
ALK PHOS: 69 U/L (ref 38–126)
ALT: 7 U/L — ABNORMAL LOW (ref 17–63)
AST: 24 U/L (ref 15–41)
Albumin: 3.3 g/dL — ABNORMAL LOW (ref 3.5–5.0)
Anion gap: 13 (ref 5–15)
BILIRUBIN TOTAL: 1 mg/dL (ref 0.3–1.2)
BUN: 42 mg/dL — ABNORMAL HIGH (ref 6–20)
CO2: 27 mmol/L (ref 22–32)
Calcium: 9.3 mg/dL (ref 8.9–10.3)
Chloride: 97 mmol/L — ABNORMAL LOW (ref 101–111)
Creatinine, Ser: 8.43 mg/dL — ABNORMAL HIGH (ref 0.61–1.24)
GFR, EST AFRICAN AMERICAN: 7 mL/min — AB (ref >60)
GFR, EST NON AFRICAN AMERICAN: 6 mL/min — AB (ref >60)
Glucose, Bld: 113 mg/dL — ABNORMAL HIGH (ref 65–99)
Potassium: 4 mmol/L (ref 3.5–5.1)
Sodium: 137 mmol/L (ref 135–145)
Total Protein: 6.3 g/dL — ABNORMAL LOW (ref 6.5–8.1)

## 2017-01-15 LAB — LIPASE, BLOOD: Lipase: 40 U/L (ref 11–51)

## 2017-01-15 MED ORDER — HYDROMORPHONE HCL 1 MG/ML IJ SOLN
0.5000 mg | Freq: Once | INTRAMUSCULAR | Status: AC
  Administered 2017-01-15: 0.5 mg via INTRAVENOUS
  Filled 2017-01-15: qty 1

## 2017-01-15 MED ORDER — CEFTRIAXONE SODIUM 1 G IJ SOLR
1.0000 g | INTRAMUSCULAR | Status: DC
  Administered 2017-01-15: 1 g via INTRAVENOUS
  Filled 2017-01-15: qty 10

## 2017-01-15 MED ORDER — ONDANSETRON HCL 4 MG/2ML IJ SOLN
4.0000 mg | Freq: Once | INTRAMUSCULAR | Status: AC
  Administered 2017-01-15: 4 mg via INTRAVENOUS
  Filled 2017-01-15: qty 2

## 2017-01-15 MED ORDER — BELLADONNA ALKALOIDS-OPIUM 16.2-60 MG RE SUPP
1.0000 | Freq: Once | RECTAL | Status: AC
Start: 2017-01-15 — End: 2017-01-16
  Administered 2017-01-16: 1 via RECTAL

## 2017-01-15 NOTE — ED Notes (Signed)
1242mL NS infused in catheter, 1228mL out.

## 2017-01-15 NOTE — ED Notes (Addendum)
Pt's catheter flushed with 200cc sterile water, per verbal order Margarita Mail, PA

## 2017-01-15 NOTE — ED Triage Notes (Signed)
Pt sts abd pain from build up in bladder; pt sts has hx of same and has to have drained by urology every couple of years; pt sts no urologist locally; pt dialysis pt due for dialysis today

## 2017-01-15 NOTE — ED Provider Notes (Signed)
[facility] DEPT Provider Note   CSN: [id number] Arrival date & time: 01/15/17  1028     History   Chief Complaint Chief Complaint  Patient presents with  . Abdominal Pain    HPI Logan Martin is a 61 y.o. male with a pmh of ESRD on HD T/TH/SAT. He did not dialyze today because he came to the ER. The patient c/o urinary urgency. He states that he normally does not make urine, but he has had urinary retention requiring cathter placement in the past. He denies fevers chills, nausea, cp, SOB.   HPI  Past Medical History:  Diagnosis Date  . Alcohol abuse    quit date in 1995  . Anemia   . Chronic low back pain   . Cocaine abuse in remission    Quit date in 1995  . 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 Started HD in [place], [place].  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  On NxStage home hemo since 2010. Using RUA AVF.    [name] ESRD on dialysis ([place])    "TTS; [place]" (06/14/2016)  . Hepatitis C   . History of blood transfusion 2007 X 1  . History of kidney stones   . Hypertension   . Peripheral vascular disease ([place])   . Pulmonary embolism [facility]) Feb 2012   Treated with coumadin x 1 year  . Renal insufficiency     Patient Active Problem List   Diagnosis Date Noted  . Pancytopenia ([place]) 06/14/2016  . Left arm pain 06/14/2016  . 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 Mechanical complication of other vascular device, implant, and graft 08/10/2013  . End stage renal disease ([place]) 08/10/2013  . ESRD (end stage renal disease) ([place]) 06/07/2013  . Nausea and vomiting 06/07/2013  . SOB (shortness of breath) 05/10/2013  . Chest discomfort 11/27/2012  . Shortness of breath 11/27/2012  . Chronic hepatitis C without hepatic coma ([place]) 02/04/2012  . Secondary hyperparathyroidism (of renal origin) 02/04/2012  . Anemia associated with chronic renal failure 02/04/2012  . CAD (coronary atherosclerotic disease)  02/04/2012  . Pulmonary embolism ([place]) 07/20/2010  . CORONARY ARTERY DISEASE 02/23/2009  . HEMORRHOIDS, INTERNAL 02/23/2009  . GERD 02/23/2009  . ESRD on dialysis [facility]) 02/16/2009    Past Surgical History:  Procedure Laterality Date  . ABDOMINAL HERNIA REPAIR  ~ 2000 X 2; 2001 X1  . AV FISTULA PLACEMENT Right 2007   upper arm  . AV FISTULA REPAIR Right ~ 03/2016 & 04/2016  . BLADDER AUGMENTATION  1975  . CORONARY ANGIOPLASTY WITH STENT PLACEMENT  1999  . EXTRACORPOREAL SHOCK WAVE LITHOTRIPSY    . HERNIA REPAIR    . INGUINAL HERNIA REPAIR Right 1975  . PERIPHERAL VASCULAR CATHETERIZATION N/A 06/22/2016   Procedure: Left Arm Venography;  Surgeon: Elam Dutch, MD;  Location: [facility];  Service: Cardiovascular;  Laterality: N/A;  . PERIPHERAL VASCULAR CATHETERIZATION N/A 06/22/2016   Procedure: A/V Fistulagram - Right Arm;  Surgeon: Elam Dutch, MD;  Location: [facility];  Service: Cardiovascular;  Laterality: N/A;  . PERIPHERAL VASCULAR CATHETERIZATION Right 06/22/2016   Procedure: Peripheral Vascular Balloon Angioplasty;  Surgeon: Elam Dutch, MD;  Location: [facility];  Service: Cardiovascular;  Laterality: Right;  arm fistula       Home Medications    Prior to Admission medications   Medication Sig Start Date End Date Taking? Authorizing Provider  calcium acetate (PHOSLO) 667 MG capsule Take 2 capsules by mouth 3 (three) times daily.    [provider]  Glecaprevir-Pibrentasvir (MAVYRET) 100-40 MG TABS Take 3 tablets by mouth daily. 08/15/16   Comer, Okey Regal, MD  hydrALAZINE (APRESOLINE) 50 MG tablet Take 50 mg by mouth 3 (three) times daily. 05/11/16   [provider]  labetalol (NORMODYNE) 200 MG tablet Take 200 mg by mouth 2 (two) times daily. 05/11/16   [provider]  multivitamin (RENA-VIT) TABS tablet Take 1 tablet by mouth at bedtime. Patient not taking: Reported on 12/11/2016 06/09/13   Murlean Iba, MD  oxycodone (ROXICODONE) 30 MG immediate release tablet Take 30 mg by mouth 3 (three) times daily.    [provider]  Oxycodone HCl 10 MG TABS Take 10 mg by mouth 3 (three) times daily as needed.  05/22/16   [provider]    Family History Family History  Problem Relation Age of Onset  . Cancer Mother   . Diabetes Mother   . Hypertension Mother   . Deep vein thrombosis Sister   . Deep vein thrombosis Daughter     Social History Social History  Substance Use Topics  . Smoking status: Former Smoker    Packs/day: 1.00    Years: 5.00    Types: Cigarettes    Quit date: 05/28/1993  . Smokeless tobacco: Never Used  . Alcohol use No     Comment: 06/14/2016 "nothing since 1995"     Allergies   Heparin; Pork-derived products; Sulfa antibiotics; and Sulfonamide derivatives   Review of Systems Review of Systems  Ten systems reviewed and are negative for acute change, except as noted in the HPI.   Physical Exam Updated Vital Signs BP (!) 153/70   Pulse 72   Temp 97.8 F (36.6 C) (Oral)   Resp 16   Ht 5\' 4"  (1.626 m)   Wt 51.3 kg (113 lb)   SpO2 96%   BMI 19.40 kg/m   Physical Exam  Constitutional: He appears well-developed and well-nourished. No distress.  HENT:  Head: Normocephalic and atraumatic.  Eyes: Conjunctivae are normal. No scleral icterus.  Neck: Normal range of motion. Neck supple.  Cardiovascular: Normal rate, regular rhythm and normal heart sounds.   Pulmonary/Chest: Effort normal and breath sounds normal. No respiratory distress.  Abdominal: Soft. He exhibits distension. There is tenderness. There is no guarding.  Tenderness and distension in the suprapubic region.  Musculoskeletal: He exhibits no edema.  Neurological: He is alert.  Skin: Skin is warm and dry. He is not diaphoretic.  Psychiatric: His behavior is normal.  Nursing note and vitals reviewed.    ED Treatments / Results  Labs (all labs ordered are listed, but  only abnormal results are displayed) Labs Reviewed  COMPREHENSIVE METABOLIC PANEL - Abnormal; Notable for the following:       Result Value   Chloride 97 (*)    Glucose, Bld 113 (*)    BUN 42 (*)    Creatinine, Ser 8.43 (*)    Total Protein 6.3 (*)    Albumin 3.3 (*)    ALT 7 (*)    GFR calc non Af Amer 6 (*)    GFR calc Af Amer 7 (*)    All other components within normal limits  CBC - Abnormal; Notable for the following:    WBC 2.9 (*)    RBC 3.37 (*)    Hemoglobin 9.7 (*)    HCT 30.2 (*)  RDW 17.6 (*)    Platelets 73 (*)    All other components within normal limits  LIPASE, BLOOD  URINALYSIS, ROUTINE W REFLEX MICROSCOPIC    EKG  EKG Interpretation None       Radiology No results found.  Procedures Procedures (including critical care time)  Medications Ordered in ED Medications - No data to display   Initial Impression / Assessment and Plan / ED Course  I have reviewed the triage vital signs and the nursing notes.  Pertinent labs & imaging results that were available during my care of the patient were reviewed by me and considered in my medical decision making (see chart for details).  Clinical Course as of Jan 16 105  Tue Jan 15, 2017  1505 WBC: (!) 2.9 [AH]  1505 Platelets: (!) 73 [AH]  1505 Hemoglobin: (!) 9.7 [AH]  1505 Creatinine: (!) 8.43 [AH]  1604 Patient with 750 ml Retined urine. Urine is clearly infected, Thick, vicous, opaque and purulent in the catheter and draining very slowly. We will flush the catheter. After consult with the ED pharmacist Suezanne Jacquet, he recommends 1 g IV Rocephin.  [AH]  1853 Patient still having sig. Bladder spasm.  Poor irrigation and drainage. He will be given  a 3 way catheter for irrigation.  [AH]  2049 Patient with three way catheter- unable to irrigate the bladder and the patient's bladder scan now reading greater than 999  [AH]    Clinical Course User Index [AH] Margarita Mail, PA-C    Patient with urinary  obstruction and UTI. His bladder was irrigated. Patient seen in the ED by Dr. Louis Meckel Patient given Rocephin IV. Continuing to have bladder spasms however this is likely secondary to infection. He has greater than 1 L of output. He'll be discharged with Keflex 500 twice a day. I did consult with main pharmacy for renal dosing. Patient appears safe for discharge at this time. He can follow up for catheter removal in 2-3 days.  Final Clinical Impressions(s) / ED Diagnoses   Final diagnoses:  None    New Prescriptions New Prescriptions   No medications on file     Margarita Mail, PA-C 01/16/17 5051    Little, Wenda Overland, MD 01/17/17 2122

## 2017-01-15 NOTE — ED Notes (Signed)
Urology at the bedside. CBI restarted. 841mL output.

## 2017-01-15 NOTE — ED Notes (Signed)
Bladder Scan Volume (mL):  775 mL

## 2017-01-15 NOTE — ED Notes (Signed)
Continuous bladder irrigation started, 351mL NS infused, very slow urine return, 200 mL urine out. EDP aware, CBI stopped, urology consulted. Bladder scan volume >999.

## 2017-01-16 DIAGNOSIS — N139 Obstructive and reflux uropathy, unspecified: Secondary | ICD-10-CM | POA: Diagnosis not present

## 2017-01-16 MED ORDER — HYDROMORPHONE HCL 1 MG/ML IJ SOLN
1.0000 mg | Freq: Once | INTRAMUSCULAR | Status: AC
  Administered 2017-01-16: 1 mg via INTRAVENOUS
  Filled 2017-01-16: qty 1

## 2017-01-16 MED ORDER — CEPHALEXIN 500 MG PO CAPS: 500.0000 mg | ORAL_CAPSULE | Freq: Two times a day (BID) | ORAL | 0 refills | Status: DC

## 2017-01-16 NOTE — Discharge Instructions (Signed)
Please follow up in 2-3 days for catheter removal. Return for any new or worsening symptoms.

## 2017-01-17 ENCOUNTER — Emergency Department (HOSPITAL_COMMUNITY)
Admission: EM | Admit: 2017-01-17 | Discharge: 2017-01-17 | Disposition: A | Discharge status: Home / Self Care | Payer: Medicare Other | Attending: Emergency Medicine | Admitting: Emergency Medicine

## 2017-01-17 ENCOUNTER — Encounter (HOSPITAL_COMMUNITY): Payer: Self-pay

## 2017-01-17 DIAGNOSIS — Z79899 Other long term (current) drug therapy: Secondary | ICD-10-CM | POA: Insufficient documentation

## 2017-01-17 DIAGNOSIS — Z87891 Personal history of nicotine dependence: Secondary | ICD-10-CM | POA: Diagnosis not present

## 2017-01-17 DIAGNOSIS — N186 End stage renal disease: Secondary | ICD-10-CM | POA: Insufficient documentation

## 2017-01-17 DIAGNOSIS — Z466 Encounter for fitting and adjustment of urinary device: Secondary | ICD-10-CM | POA: Insufficient documentation

## 2017-01-17 DIAGNOSIS — I120 Hypertensive chronic kidney disease with stage 5 chronic kidney disease or end stage renal disease: Secondary | ICD-10-CM | POA: Diagnosis not present

## 2017-01-17 DIAGNOSIS — Z992 Dependence on renal dialysis: Secondary | ICD-10-CM | POA: Insufficient documentation

## 2017-01-17 DIAGNOSIS — I2510 Atherosclerotic heart disease of native coronary artery without angina pectoris: Secondary | ICD-10-CM | POA: Diagnosis not present

## 2017-01-17 LAB — URINE CULTURE

## 2017-01-17 NOTE — ED Notes (Signed)
See EDP assessment 

## 2017-01-17 NOTE — ED Triage Notes (Signed)
Pt endorses being seen here and having a foley placed due to build up in bladder which happens every 3 years. Pt was told to come back today for removal of the catheter. Pt has no complaints. VSS.

## 2017-01-17 NOTE — ED Notes (Signed)
Pt's states understanding of d/c instructions.

## 2017-01-17 NOTE — Discharge Instructions (Signed)
Keep your scheduled follow up appointment on Monday.  Return to ER for new or worsening symptoms, any additional concerns.

## 2017-01-17 NOTE — ED Provider Notes (Signed)
[facility] DEPT Provider Note   CSN: [id number] Arrival date & time: 01/17/17  1818     History   Chief Complaint Chief Complaint  Patient presents with  . catheter removal    HPI ARIES Logan Martin is a 61 y.o. male.  The history is provided by the patient and medical records. No language interpreter was used.   Logan Martin is a 61 y.o. male  with a PMH of ESRD who presents to the Emergency Department for foley catheter removal. Patient was seen in ED on 8/21 where he had urinary obstruction and UTI. Taking antibiotics as directed. Started feeling much better a day after discharge and with no complaints today. Has follow up appointment with PCP on Monday. He was suppose to have foley removed in 2-3 days therefore came to ED.  Past Medical History:  Diagnosis Date  . Alcohol abuse    quit date in 1995  . Anemia   . Chronic low back pain   . Cocaine abuse in remission    Quit date in 1995  . 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 Started HD in [place], [place].  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  On NxStage home hemo since 2010. Using RUA AVF.    [name] ESRD on dialysis ([place])    "TTS; [place]" (06/14/2016)  . Hepatitis C   . History of blood transfusion 2007 X 1  . History of kidney stones   . Hypertension   . Peripheral vascular disease ([place])   . Pulmonary embolism [facility]) Feb 2012   Treated with coumadin x 1 year  . Renal insufficiency     Patient Active Problem List   Diagnosis Date Noted  . Pancytopenia ([place]) 06/14/2016  . Left arm pain 06/14/2016  . 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 Mechanical complication of other vascular device, implant, and graft 08/10/2013  . End stage renal disease ([place]) 08/10/2013  . ESRD (end stage renal disease) ([place]) 06/07/2013  . Nausea and vomiting 06/07/2013  . SOB (shortness of breath) 05/10/2013  . Chest discomfort 11/27/2012  . Shortness of breath 11/27/2012   . Chronic hepatitis C without hepatic coma ([place]) 02/04/2012  . Secondary hyperparathyroidism (of renal origin) 02/04/2012  . Anemia associated with chronic renal failure 02/04/2012  . CAD (coronary atherosclerotic disease) 02/04/2012  . Pulmonary embolism ([place]) 07/20/2010  . CORONARY ARTERY DISEASE 02/23/2009  . HEMORRHOIDS, INTERNAL 02/23/2009  . GERD 02/23/2009  . ESRD on dialysis [facility]) 02/16/2009    Past Surgical History:  Procedure Laterality Date  . ABDOMINAL HERNIA REPAIR  ~ 2000 X 2; 2001 X1  . AV FISTULA PLACEMENT Right 2007   upper arm  . AV FISTULA REPAIR Right ~ 03/2016 & 04/2016  . BLADDER AUGMENTATION  1975  . CORONARY ANGIOPLASTY WITH STENT PLACEMENT  1999  . EXTRACORPOREAL SHOCK WAVE LITHOTRIPSY    . HERNIA REPAIR    . INGUINAL HERNIA REPAIR Right 1975  . PERIPHERAL VASCULAR CATHETERIZATION N/A 06/22/2016   Procedure: Left Arm Venography;  Surgeon: Elam Dutch, MD;  Location: [place] CV LAB;  Service: Cardiovascular;  Laterality: N/A;  . PERIPHERAL VASCULAR CATHETERIZATION N/A 06/22/2016   Procedure: A/V Fistulagram - Right Arm;  Surgeon: Elam Dutch, MD;  Location: [place] CV LAB;  Service: Cardiovascular;  Laterality: N/A;  . PERIPHERAL VASCULAR CATHETERIZATION Right 06/22/2016   Procedure: Peripheral Vascular Balloon Angioplasty;  Surgeon: Elam Dutch, MD;  Location:  [place] INVASIVE CV LAB;  Service: Cardiovascular;  Laterality: Right;  arm fistula       Home Medications    Prior to Admission medications   Medication Sig Start Date End Date Taking? Authorizing Provider  calcium acetate (PHOSLO) 667 MG capsule Take 1,334 mg by mouth 3 (three) times daily with meals.     [provider]  cephALEXin (KEFLEX) 500 MG capsule Take 1 capsule (500 mg total) by mouth 2 (two) times daily. 01/16/17   Harris, Abigail, PA-C  Glecaprevir-Pibrentasvir (MAVYRET) 100-40 MG TABS Take 3 tablets by mouth daily. Patient not taking: Reported on 01/15/2017  08/15/16   Thayer Headings, MD  hydrALAZINE (APRESOLINE) 50 MG tablet Take 50 mg by mouth 3 (three) times daily. 05/11/16   [provider]  labetalol (NORMODYNE) 200 MG tablet Take 200 mg by mouth 2 (two) times daily. 05/11/16   [provider]  multivitamin (RENA-VIT) TABS tablet Take 1 tablet by mouth at bedtime. 06/09/13   Johnson, Clanford L, MD  oxycodone (ROXICODONE) 30 MG immediate release tablet Take 30 mg by mouth 3 (three) times daily.    [provider]  Oxycodone HCl 10 MG TABS Take 10 mg by mouth 3 (three) times daily as needed (for pain).  05/22/16   [provider]    Family History Family History  Problem Relation Age of Onset  . Cancer Mother   . Diabetes Mother   . Hypertension Mother   . Deep vein thrombosis Sister   . Deep vein thrombosis Daughter     Social History Social History  Substance Use Topics  . Smoking status: Former Smoker    Packs/day: 1.00    Years: 5.00    Types: Cigarettes    Quit date: 05/28/1993  . Smokeless tobacco: Never Used  . Alcohol use No     Comment: 06/14/2016 "nothing since 1995"     Allergies   Heparin; Pork-derived products; Sulfa antibiotics; and Sulfonamide derivatives   Review of Systems Review of Systems  Gastrointestinal: Negative for abdominal pain, nausea and vomiting.  Genitourinary: Negative for dysuria and hematuria.  Musculoskeletal: Negative for back pain.     Physical Exam Updated Vital Signs BP (!) 157/78 (BP Location: Left Arm)   Pulse 98   Temp 98 F (36.7 C) (Oral)   Resp 16   Ht 5\' 4"  (1.626 m)   Wt 51.3 kg (113 lb)   SpO2 100%   BMI 19.40 kg/m   Physical Exam  Constitutional: He appears well-developed and well-nourished. No distress.  HENT:  Head: Normocephalic and atraumatic.  Neck: Neck supple.  Cardiovascular: Normal rate, regular rhythm and normal heart sounds.   No murmur heard. Pulmonary/Chest: Effort normal and breath sounds normal. No respiratory  distress. He has no wheezes. He has no rales.  Abdominal: Soft. He exhibits no distension. There is no tenderness.  Musculoskeletal: Normal range of motion.  Neurological: He is alert.  Skin: Skin is warm and dry.  Nursing note and vitals reviewed.    ED Treatments / Results  Labs (all labs ordered are listed, but only abnormal results are displayed) Labs Reviewed - No data to display  EKG  EKG Interpretation None       Radiology No results found.  Procedures Procedures (including critical care time)  Medications Ordered in ED Medications - No data to display   Initial Impression / Assessment and Plan / ED Course  I have reviewed the triage vital signs and the nursing  notes.  Pertinent labs & imaging results that were available during my care of the patient were reviewed by me and considered in my medical decision making (see chart for details).    Logan Martin is a 61 y.o. male who presents to ED for foley catheter removal. Seen on 8/21 where foley was placed and told he should have removed in 2-3 days. With no complaints today and feels much better. Foley removed without complication. He has a follow-up appointment with his primary care provider on Monday and was encouraged to keep this appointment. Reasons to return to ED discussed and all questions answered.   Final Clinical Impressions(s) / ED Diagnoses   Final diagnoses:  Encounter for Foley catheter removal    New Prescriptions New Prescriptions   No medications on file     Ward, Ozella Almond, PA-C 01/17/17 2100    Charlesetta Shanks, MD 01/21/17 [phone]

## 2017-01-17 NOTE — ED Notes (Signed)
Patient called in all areas of the lobby and triage area with no response.

## 2017-01-21 DIAGNOSIS — I517 Cardiomegaly: Secondary | ICD-10-CM | POA: Insufficient documentation

## 2017-01-21 DIAGNOSIS — I509 Heart failure, unspecified: Secondary | ICD-10-CM | POA: Insufficient documentation

## 2017-01-24 ENCOUNTER — Other Ambulatory Visit: Payer: Self-pay | Admitting: Pharmacist

## 2017-01-24 MED FILL — MAVYRET 100-40 MG TABS: 100-40 | 28 days supply | Qty: 84 | Fill #0

## 2017-02-07 ENCOUNTER — Encounter: Payer: Self-pay | Admitting: Pharmacy Technician

## 2017-02-13 NOTE — Telephone Encounter (Signed)
lasdjf;

## 2017-02-18 DIAGNOSIS — E872 Acidosis, unspecified: Secondary | ICD-10-CM | POA: Insufficient documentation

## 2017-02-18 DIAGNOSIS — D513 Other dietary vitamin B12 deficiency anemia: Secondary | ICD-10-CM | POA: Insufficient documentation

## 2017-02-18 DIAGNOSIS — Z992 Dependence on renal dialysis: Secondary | ICD-10-CM | POA: Insufficient documentation

## 2017-02-18 DIAGNOSIS — N2589 Other disorders resulting from impaired renal tubular function: Secondary | ICD-10-CM | POA: Insufficient documentation

## 2017-02-18 DIAGNOSIS — E878 Other disorders of electrolyte and fluid balance, not elsewhere classified: Secondary | ICD-10-CM | POA: Insufficient documentation

## 2017-02-18 DIAGNOSIS — E440 Moderate protein-calorie malnutrition: Secondary | ICD-10-CM | POA: Insufficient documentation

## 2017-02-18 DIAGNOSIS — I132 Hypertensive heart and chronic kidney disease with heart failure and with stage 5 chronic kidney disease, or end stage renal disease: Secondary | ICD-10-CM | POA: Insufficient documentation

## 2017-02-18 DIAGNOSIS — Z87891 Personal history of nicotine dependence: Secondary | ICD-10-CM | POA: Insufficient documentation

## 2017-02-18 DIAGNOSIS — D509 Iron deficiency anemia, unspecified: Secondary | ICD-10-CM | POA: Insufficient documentation

## 2017-02-18 MED FILL — MAVYRET 100-40 MG TABS: 100-40 | 28 days supply | Qty: 84 | Fill #1

## 2017-02-20 ENCOUNTER — Ambulatory Visit: Payer: Medicare Other

## 2017-02-20 DIAGNOSIS — R252 Cramp and spasm: Secondary | ICD-10-CM | POA: Insufficient documentation

## 2017-03-14 ENCOUNTER — Telehealth: Payer: Self-pay | Admitting: Pharmacist Clinician (PhC)/ Clinical Pharmacy Specialist

## 2017-03-14 NOTE — Telephone Encounter (Signed)
Logan Martin missed the appt last appt with pharmacy due to dialysis issue. Schedule him to come back next week so we can do labs. He has missed one dose so far. Counseled on adherence again. He has some diarrhea. Told him to use imodium if it happens again.

## 2017-03-21 ENCOUNTER — Ambulatory Visit: Payer: Medicare Other

## 2017-03-28 ENCOUNTER — Ambulatory Visit (INDEPENDENT_AMBULATORY_CARE_PROVIDER_SITE_OTHER): Payer: Medicare Other | Admitting: Pharmacist Clinician (PhC)/ Clinical Pharmacy Specialist

## 2017-03-28 DIAGNOSIS — Z23 Encounter for immunization: Secondary | ICD-10-CM | POA: Diagnosis present

## 2017-03-28 DIAGNOSIS — B182 Chronic viral hepatitis C: Secondary | ICD-10-CM

## 2017-03-28 NOTE — Progress Notes (Signed)
HPI: Logan Martin is a 61 y.o. male who is here for his end of therapy follow-up for his [place].   Lab Results  Component Value Date   HEPCGENOTYPE HCVD5 06/15/2016    Allergies: Allergies  Allergen Reactions  . Heparin Rash    Pork products   . Pork-Derived Products Rash  . Sulfa Antibiotics Rash  . Sulfonamide Derivatives Rash    Vitals:    Past Medical History: Past Medical History:  Diagnosis Date  . Alcohol abuse    quit date in 1995  . Anemia   . Chronic low back pain   . Cocaine abuse in remission    Quit date in 1995  . 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 Started HD in [place].  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  On NxStage home hemo since 2010. Using RUA AVF.    [name] ESRD on dialysis ([place])    "TTS; [place]" (06/14/2016)  . Hepatitis C   . History of blood transfusion 2007 X 1  . History of kidney stones   . Hypertension   . Peripheral vascular disease ([place])   . Pulmonary embolism [facility]) Feb 2012   Treated with coumadin x 1 year  . Renal insufficiency     Social History: Social History   Social History  . Marital status: Single    Spouse name: N/A  . Number of children: N/A  . Years of education: N/A   Social History Main Topics  . Smoking status: Former Smoker    Packs/day: 1.00    Years: 5.00    Types: Cigarettes    Quit date: 05/28/1993  . Smokeless tobacco: Never Used  . Alcohol use No     Comment: 06/14/2016 "nothing since 1995"  . Drug use: No     Comment: 06/14/2016 "nothing since 1995"  . Sexual activity: Yes   Other Topics Concern  . Not on file   Social History Narrative  . No narrative on file    Labs: Hep B S Ab (no units)  Date Value  06/16/2016 Reactive   Hepatitis B Surface Ag (no units)  Date Value  06/16/2016 Negative   HCV Ab (no units)  Date Value  07/07/2014 Reactive (A)    Lab Results  Component Value Date   HEPCGENOTYPE HCVD5 06/15/2016     No flowsheet data found.  AST (U/L)  Date Value  01/15/2017 24  12/11/2016 22  06/14/2016 20   ALT (U/L)  Date Value  01/15/2017 7 (L)  12/11/2016 7 (L)  06/14/2016 9 (L)   INR (no units)  Date Value  12/06/2010 1.10  11/28/2010 1.86 (H)  07/27/2010 (HH)   5.07 REPEATED TO VERIFY CRITICAL RESULT CALLED TO, READ BACK BY AND VERIFIED WITH: J.MALONE,RN 07/27/10 1352 BY BSLADE    Fibrosis Score: F2/F3 as assessed by Metavir score on Abdominal Ultrasound w/ Elastography  Child-Pugh Score: A   Assessment: Logan Martin presents for his end of therapy appointment for his [place]. He took his last dose of Mavyret on Monday 10/30. He reports that he only missed one dose of his entire treatment. He did report that he would take the medication at different times of the day but did take it with food. He had some diarrhea while on Mavyret which has since resolved since stopping the medication.   We will re-check his HCV viral load and CMET today and bring him back in 3 months for  his SVR12 visit. We will also gave him his second Hepatitis A and Hepatitis B vaccine. He had already received his annual flu shot. We encouraged him to go to a local pharmacy and get the new shingles vaccine, Shingrix since he is over 84 years of age.   Recommendations: - HCV viral load and CMET today - 2nd Hep A and Hep B vaccines today - F/U with pharmacy on 06/27/17 at 4 PM for SVR 12 visit  - 3rd Hepatitis B vaccine at 1/31 visit   Susa Raring, Pharm.[facility] 03/28/2017, 3:56 PM   Logan Martin got the 2nd hep B today. He won't need the 3rd one since his Hbsab is positive already. That was even before the first shot in Jan.   Minh Pham, PharmD, BCPS, AAHIVP, CPP Infectious Disease Pharmacist Pager: [phone] 03/28/2017 9:14 PM

## 2017-03-30 LAB — COMPREHENSIVE METABOLIC PANEL
AG Ratio: 1.5 (calc) (ref 1.0–2.5)
ALT: 5 U/L — AB (ref 9–46)
AST: 13 U/L (ref 10–35)
Albumin: 4 g/dL (ref 3.6–5.1)
Alkaline phosphatase (APISO): 105 U/L (ref 40–115)
BILIRUBIN TOTAL: 0.7 mg/dL (ref 0.2–1.2)
BUN/Creatinine Ratio: 7 (calc) (ref 6–22)
BUN: 43 mg/dL — AB (ref 7–25)
CALCIUM: 9 mg/dL (ref 8.6–10.3)
CO2: 26 mmol/L (ref 20–32)
CREATININE: 5.98 mg/dL — AB (ref 0.70–1.25)
Chloride: 97 mmol/L — ABNORMAL LOW (ref 98–110)
Globulin: 2.7 g/dL (calc) (ref 1.9–3.7)
Glucose, Bld: 100 mg/dL — ABNORMAL HIGH (ref 65–99)
Potassium: 3.5 mmol/L (ref 3.5–5.3)
SODIUM: 138 mmol/L (ref 135–146)
TOTAL PROTEIN: 6.7 g/dL (ref 6.1–8.1)

## 2017-03-30 LAB — HEPATITIS C RNA QUANTITATIVE
HCV QUANT LOG: NOT DETECTED {Log_IU}/mL
HCV RNA, PCR, QN: <15 IU/mL

## 2017-05-11 ENCOUNTER — Emergency Department (HOSPITAL_COMMUNITY): Payer: Medicare Other

## 2017-05-11 ENCOUNTER — Other Ambulatory Visit: Payer: Self-pay

## 2017-05-11 ENCOUNTER — Encounter (HOSPITAL_COMMUNITY): Payer: Self-pay

## 2017-05-11 ENCOUNTER — Emergency Department (HOSPITAL_COMMUNITY)
Admission: EM | Admit: 2017-05-11 | Discharge: 2017-05-11 | Disposition: A | Discharge status: Home / Self Care | Payer: Medicare Other | Attending: Emergency Medicine | Admitting: Emergency Medicine

## 2017-05-11 DIAGNOSIS — Z87891 Personal history of nicotine dependence: Secondary | ICD-10-CM | POA: Insufficient documentation

## 2017-05-11 DIAGNOSIS — N186 End stage renal disease: Secondary | ICD-10-CM | POA: Diagnosis not present

## 2017-05-11 DIAGNOSIS — H66012 Acute suppurative otitis media with spontaneous rupture of ear drum, left ear: Secondary | ICD-10-CM | POA: Diagnosis not present

## 2017-05-11 DIAGNOSIS — R51 Headache: Secondary | ICD-10-CM | POA: Insufficient documentation

## 2017-05-11 DIAGNOSIS — I120 Hypertensive chronic kidney disease with stage 5 chronic kidney disease or end stage renal disease: Secondary | ICD-10-CM | POA: Insufficient documentation

## 2017-05-11 DIAGNOSIS — Z79899 Other long term (current) drug therapy: Secondary | ICD-10-CM | POA: Insufficient documentation

## 2017-05-11 DIAGNOSIS — H7092 Unspecified mastoiditis, left ear: Secondary | ICD-10-CM | POA: Insufficient documentation

## 2017-05-11 DIAGNOSIS — H6121 Impacted cerumen, right ear: Secondary | ICD-10-CM

## 2017-05-11 DIAGNOSIS — I2510 Atherosclerotic heart disease of native coronary artery without angina pectoris: Secondary | ICD-10-CM | POA: Diagnosis not present

## 2017-05-11 DIAGNOSIS — H9203 Otalgia, bilateral: Secondary | ICD-10-CM | POA: Diagnosis present

## 2017-05-11 MED ORDER — NEOMYCIN-POLYMYXIN-HC 3.5-10000-1 OT SUSP: 4.0000 [drp] | Freq: Three times a day (TID) | OTIC | 0 refills | Status: DC

## 2017-05-11 MED ORDER — DOCUSATE SODIUM 50 MG/5ML PO LIQD
50.0000 mg | Freq: Once | ORAL | Status: AC
  Administered 2017-05-11: 50 mg via OTIC
  Filled 2017-05-11: qty 10

## 2017-05-11 MED ORDER — CIPROFLOXACIN HCL 500 MG PO TABS: ORAL_TABLET | ORAL | 0 refills | Status: DC

## 2017-05-11 MED ORDER — HYDROCODONE-ACETAMINOPHEN 5-325 MG PO TABS: ORAL_TABLET | ORAL | 0 refills | Status: DC

## 2017-05-11 NOTE — ED Notes (Signed)
Patient transported to CT 

## 2017-05-11 NOTE — ED Triage Notes (Signed)
Patient complains of left sided ear drainage with pressure and pain x 3 days, no other complaints

## 2017-05-11 NOTE — ED Provider Notes (Signed)
[facility] EMERGENCY DEPARTMENT Provider Note   CSN: [id number] Arrival date & time: 05/11/17  5621     History   Chief Complaint No chief complaint on file.   HPI Logan Martin is a 61 y.o. male who presents to the ED forLeft ear drainage and otalgia. Patient noticed that his Left ear was itchy 2 days ago and when he scratched it he noticed drainage form the ear. Logan Martin he developed pain which is now giving him headache. He denies neck stiffness or rash. He denies fevers. He is ESRD and home dialyzes on NxStage. He has been on dialysis for 21 years.   HPI  Past Medical History:  Diagnosis Date  . Alcohol abuse    quit date in 1995  . Anemia   . Chronic low back pain   . Cocaine abuse in remission ([place])    Quit date in 1995  . 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 Started HD in [place], [place].  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  On NxStage home hemo since 2010. Using RUA AVF.    [name] ESRD on dialysis ([place])    "TTS; [place]" (06/14/2016)  . Hepatitis C   . History of blood transfusion 2007 X 1  . History of kidney stones   . Hypertension   . Peripheral vascular disease ([place])   . Pulmonary embolism [facility]) Feb 2012   Treated with coumadin x 1 year  . Renal insufficiency     Patient Active Problem List   Diagnosis Date Noted  . Pancytopenia ([place]) 06/14/2016  . Left arm pain 06/14/2016  . 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 Mechanical complication of other vascular device, implant, and graft 08/10/2013  . End stage renal disease ([place]) 08/10/2013  . ESRD (end stage renal disease) ([place]) 06/07/2013  . Nausea and vomiting 06/07/2013  . SOB (shortness of breath) 05/10/2013  . Chest discomfort 11/27/2012  . Shortness of breath 11/27/2012  . Chronic hepatitis C without hepatic coma ([place]) 02/04/2012  . Secondary hyperparathyroidism (of renal origin) 02/04/2012  . Anemia associated  with chronic renal failure 02/04/2012  . CAD (coronary atherosclerotic disease) 02/04/2012  . Pulmonary embolism ([place]) 07/20/2010  . CORONARY ARTERY DISEASE 02/23/2009  . HEMORRHOIDS, INTERNAL 02/23/2009  . GERD 02/23/2009  . ESRD on dialysis [facility]) 02/16/2009    Past Surgical History:  Procedure Laterality Date  . ABDOMINAL HERNIA REPAIR  ~ 2000 X 2; 2001 X1  . AV FISTULA PLACEMENT Right 2007   upper arm  . AV FISTULA REPAIR Right ~ 03/2016 & 04/2016  . BLADDER AUGMENTATION  1975  . CORONARY ANGIOPLASTY WITH STENT PLACEMENT  1999  . EXTRACORPOREAL SHOCK WAVE LITHOTRIPSY    . HERNIA REPAIR    . INGUINAL HERNIA REPAIR Right 1975  . PERIPHERAL VASCULAR CATHETERIZATION N/A 06/22/2016   Procedure: Left Arm Venography;  Surgeon: Elam Dutch, MD;  Location: [place] CV LAB;  Service: Cardiovascular;  Laterality: N/A;  . PERIPHERAL VASCULAR CATHETERIZATION N/A 06/22/2016   Procedure: A/V Fistulagram - Right Arm;  Surgeon: Elam Dutch, MD;  Location: [place] CV LAB;  Service: Cardiovascular;  Laterality: N/A;  . PERIPHERAL VASCULAR CATHETERIZATION Right 06/22/2016   Procedure: Peripheral Vascular Balloon Angioplasty;  Surgeon: Elam Dutch, MD;  Location: [place] CV LAB;  Service: Cardiovascular;  Laterality: Right;  arm fistula       Home Medications    Prior to  Admission medications   Medication Sig Start Date End Date Taking? Authorizing Provider  calcium acetate (PHOSLO) 667 MG capsule Take 1,334 mg by mouth 3 (three) times daily with meals.     [provider]  cephALEXin (KEFLEX) 500 MG capsule Take 1 capsule (500 mg total) by mouth 2 (two) times daily. 01/16/17   Margarita Mail, PA-C  ciprofloxacin (CIPRO) 500 MG tablet Take 500 mg by mouth every 24 hours. On the days that you dialyze, make sure to take the medication after your dialysis. 05/11/17   Ramiel Forti, PA-C  Glecaprevir-Pibrentasvir (MAVYRET) 100-40 MG TABS Take 3 tablets by mouth  daily. Patient not taking: Reported on 01/15/2017 08/15/16   Thayer Headings, MD  hydrALAZINE (APRESOLINE) 50 MG tablet Take 50 mg by mouth 3 (three) times daily. 05/11/16   [provider]  HYDROcodone-acetaminophen (NORCO/VICODIN) 5-325 MG tablet Take one tablet every 8-12 hours for severe pain. 05/11/17   Margarita Mail, PA-C  labetalol (NORMODYNE) 200 MG tablet Take 200 mg by mouth 2 (two) times daily. 05/11/16   [provider]  multivitamin (RENA-VIT) TABS tablet Take 1 tablet by mouth at bedtime. 06/09/13   Johnson, Clanford L, MD  neomycin-polymyxin-hydrocortisone (CORTISPORIN) 3.5-10000-1 OTIC suspension Place 4 drops into the left ear 3 (three) times daily. 05/11/17   Margarita Mail, PA-C  oxycodone (ROXICODONE) 30 MG immediate release tablet Take 30 mg by mouth 3 (three) times daily.    [provider]  Oxycodone HCl 10 MG TABS Take 10 mg by mouth 3 (three) times daily as needed (for pain).  05/22/16   [provider]    Family History Family History  Problem Relation Age of Onset  . Cancer Mother   . Diabetes Mother   . Hypertension Mother   . Deep vein thrombosis Sister   . Deep vein thrombosis Daughter     Social History Social History   Tobacco Use  . Smoking status: Former Smoker    Packs/day: 1.00    Years: 5.00    Pack years: 5.00    Types: Cigarettes    Last attempt to quit: 05/28/1993    Years since quitting: 23.9  . Smokeless tobacco: Never Used  Substance Use Topics  . Alcohol use: No    Comment: 06/14/2016 "nothing since 1995"  . Drug use: No    Comment: 06/14/2016 "nothing since 1995"     Allergies   Heparin; Pork-derived products; Sulfa antibiotics; and Sulfonamide derivatives   Review of Systems Review of Systems Ten systems reviewed and are negative for acute change, except as noted in the HPI.    Physical Exam Updated Vital Signs BP (!) 134/58   Pulse 66   Temp 97.6 F (36.4 C) (Oral)   Resp 18   SpO2  100%   Physical Exam  Constitutional: He appears well-developed and well-nourished. No distress.  HENT:  Head: Normocephalic and atraumatic.  Left Ear: There is drainage. There is mastoid tenderness. Tympanic membrane is perforated.  Ears:  Eyes: Conjunctivae are normal. No scleral icterus.  Neck: Normal range of motion. Neck supple.  Cardiovascular: Normal rate, regular rhythm and normal heart sounds.  AVF RUE w/ palpable thrill  Pulmonary/Chest: Effort normal and breath sounds normal. No respiratory distress.  Abdominal: Soft. There is no tenderness.  Musculoskeletal: Normal range of motion. He exhibits no edema.  Neurological: He is alert.  Skin: Skin is warm and dry. He is not diaphoretic.  Psychiatric: His behavior is normal.  Nursing note and  vitals reviewed.     ED Treatments / Results  Labs (all labs ordered are listed, but only abnormal results are displayed) Labs Reviewed - No data to display  EKG  EKG Interpretation None       Radiology Ct Head Wo Contrast  Result Date: 05/11/2017 CLINICAL DATA:  61 year old male with left ear drainage pain and pressure for 3 days. EXAM: CT HEAD WITHOUT CONTRAST TECHNIQUE: Contiguous axial images were obtained from the base of the skull through the vertex without intravenous contrast. COMPARISON:  Neck CT 06/14/2016. FINDINGS: Brain: No midline shift, ventriculomegaly, mass effect, evidence of mass lesion, intracranial hemorrhage or evidence of cortically based acute infarction. Gray-white matter differentiation is within normal limits throughout the brain. Vascular: Calcified atherosclerosis at the skull base. Skull: Abnormal bilateral temporal bones described below. Elsewhere the calvarium is intact and bone mineralization appears within normal limits. Sinuses/Orbits: Visualized paranasal sinuses are stable and well pneumatized. Chronic bilateral middle ear and mastoid opacification. Chronic but increased bone destruction along  the left posterior floor of the external auditory canal affecting the anterior left mastoid (series 4, image 11). Circumferential soft tissue and/or debris within the left EAC is similar to the prior study. No enlargement of the left pinna. The surrounding left periauricular soft tissues have a normal noncontrast appearance. No contralateral right temporal bone or mastoid destruction is evident. Other: Other visible scalp and orbits soft tissues appear within normal limits. IMPRESSION: 1. Chronic severe left middle ear and mastoid disease appears mildly progressed since January, including new or increased bone erosion along the posterior left EAC. The prolonged time course and lack of apparent periauricular soft tissue inflammatory suggests a chronic inflammatory process such as Cholesteatoma rather than an Infectious Otomastoiditis. 2. Chronic right middle ear and mastoid opacification appears stable since January. 3.  Normal for age non contrast CT appearance of the brain. Electronically Signed   By: Genevie Ann M.D.   On: 05/11/2017 10:25    Procedures Procedures (including critical care time)  Medications Ordered in ED Medications  docusate (COLACE) 50 MG/5ML liquid 50 mg (50 mg Right EAR Given 05/11/17 1049)     Initial Impression / Assessment and Plan / ED Course  I have reviewed the triage vital signs and the nursing notes.  Pertinent labs & imaging results that were available during my care of the patient were reviewed by me and considered in my medical decision making (see chart for details).     Patient seen and shared visit with Dr. Tamera Punt at with Dr. Erik Obey who consulted on the patient.  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  I discussed the case with Dr. Erik Obey, we reviewed the images and he saw the patient in the room.  He advised Polysporin otic suspension and Cipro orally for treatment.  The patient  may have his cerumen impaction debrided at the office patient will also be given some pain medication.  I reviewed proper dosing with Cipro with our on call pharmacist.  He appears appropriate for discharge at this time.  Final Clinical Impressions(s) / ED Diagnoses   Final diagnoses:  Acute suppurative otitis media of left ear with spontaneous rupture of tympanic membrane, recurrence not specified  Mastoiditis of left side  Impacted cerumen of right ear    ED Discharge Orders        Ordered    neomycin-polymyxin-hydrocortisone (CORTISPORIN) 3.5-10000-1 OTIC suspension  3 times daily  05/11/17 1116    ciprofloxacin (CIPRO) 500 MG tablet     05/11/17 1116    HYDROcodone-acetaminophen (NORCO/VICODIN) 5-325 MG tablet     05/11/17 1116       Margarita Mail, PA-C 05/11/17 1636    Malvin Johns, MD 05/12/17 [phone]

## 2017-05-11 NOTE — Discharge Instructions (Signed)
Put one drop of oil in the Right ear daily until you have seen the ENT doctors. Follow up this week with [facility].  Contact a health care provider if: You have bleeding from your nose. There is a lump on your neck. You are not getting better in 5 days. You feel worse instead of better. Get help right away if: You have severe pain that is not controlled with medicine. You have swelling, redness, or pain around your ear. You have stiffness in your neck. A part of your face is paralyzed. The bone behind your ear (mastoid) is tender when you touch it. You develop a severe headache.

## 2017-06-19 DIAGNOSIS — H7013 Chronic mastoiditis, bilateral: Secondary | ICD-10-CM | POA: Insufficient documentation

## 2017-06-27 ENCOUNTER — Ambulatory Visit: Payer: Medicare Other

## 2017-07-14 ENCOUNTER — Emergency Department (HOSPITAL_COMMUNITY)
Admission: EM | Admit: 2017-07-14 | Discharge: 2017-07-14 | Disposition: A | Discharge status: Home / Self Care | Payer: Medicare Other | Attending: Emergency Medicine | Admitting: Emergency Medicine

## 2017-07-14 ENCOUNTER — Encounter (HOSPITAL_COMMUNITY): Payer: Self-pay

## 2017-07-14 DIAGNOSIS — R509 Fever, unspecified: Secondary | ICD-10-CM | POA: Diagnosis present

## 2017-07-14 DIAGNOSIS — Z5321 Procedure and treatment not carried out due to patient leaving prior to being seen by health care provider: Secondary | ICD-10-CM | POA: Insufficient documentation

## 2017-07-14 LAB — COMPREHENSIVE METABOLIC PANEL
ALT: 15 U/L — ABNORMAL LOW (ref 17–63)
ANION GAP: 14 (ref 5–15)
AST: 21 U/L (ref 15–41)
Albumin: 3.5 g/dL (ref 3.5–5.0)
Alkaline Phosphatase: 104 U/L (ref 38–126)
BILIRUBIN TOTAL: 0.8 mg/dL (ref 0.3–1.2)
BUN: 34 mg/dL — AB (ref 6–20)
CHLORIDE: 99 mmol/L — AB (ref 101–111)
CO2: 25 mmol/L (ref 22–32)
Calcium: 9.2 mg/dL (ref 8.9–10.3)
Creatinine, Ser: 7.44 mg/dL — ABNORMAL HIGH (ref 0.61–1.24)
GFR, EST AFRICAN AMERICAN: 8 mL/min — AB (ref >60)
GFR, EST NON AFRICAN AMERICAN: 7 mL/min — AB (ref >60)
Glucose, Bld: 121 mg/dL — ABNORMAL HIGH (ref 65–99)
POTASSIUM: 4.2 mmol/L (ref 3.5–5.1)
Sodium: 138 mmol/L (ref 135–145)
Total Protein: 6.9 g/dL (ref 6.5–8.1)

## 2017-07-14 LAB — CBC WITH DIFFERENTIAL/PLATELET
BASOS ABS: 0 10*3/uL (ref 0.0–0.1)
Basophils Relative: 1 %
EOS PCT: 10 %
Eosinophils Absolute: 0.3 10*3/uL (ref 0.0–0.7)
HCT: 32.9 % — ABNORMAL LOW (ref 39.0–52.0)
HEMOGLOBIN: 10.5 g/dL — AB (ref 13.0–17.0)
LYMPHS ABS: 1.1 10*3/uL (ref 0.7–4.0)
LYMPHS PCT: 33 %
MCH: 30.3 pg (ref 26.0–34.0)
MCHC: 31.9 g/dL (ref 30.0–36.0)
MCV: 95.1 fL (ref 78.0–100.0)
Monocytes Absolute: 0.4 10*3/uL (ref 0.1–1.0)
Monocytes Relative: 11 %
NEUTROS ABS: 1.5 10*3/uL — AB (ref 1.7–7.7)
NEUTROS PCT: 45 %
PLATELETS: 124 10*3/uL — AB (ref 150–400)
RBC: 3.46 MIL/uL — AB (ref 4.22–5.81)
RDW: 15.8 % — ABNORMAL HIGH (ref 11.5–15.5)
WBC: 3.3 10*3/uL — AB (ref 4.0–10.5)

## 2017-07-14 LAB — I-STAT CG4 LACTIC ACID, ED: LACTIC ACID, VENOUS: 1.89 mmol/L (ref 0.5–1.9)

## 2017-07-14 NOTE — ED Triage Notes (Signed)
Patient complains of ongoing bilateral ear pain, fever, chills and taking antibiotics for otitis. Patient had last dialysis treatment yesterday, alert and oriented

## 2017-07-14 NOTE — ED Notes (Signed)
No answer in waiting room 

## 2017-07-14 NOTE — ED Notes (Signed)
Called PT from [facility] to take to room, No Answer

## 2017-10-13 ENCOUNTER — Encounter (HOSPITAL_COMMUNITY)
Admission: EM | Disposition: A | Discharge status: Home / Self Care | Payer: Self-pay | Source: Home / Self Care | Attending: Internal Medicine

## 2017-10-13 ENCOUNTER — Ambulatory Visit (HOSPITAL_COMMUNITY): Payer: Medicare Other | Admitting: Anesthesiology

## 2017-10-13 ENCOUNTER — Ambulatory Visit (HOSPITAL_COMMUNITY): Payer: Medicare Other

## 2017-10-13 ENCOUNTER — Encounter (HOSPITAL_COMMUNITY): Payer: Self-pay | Admitting: Emergency Medicine

## 2017-10-13 ENCOUNTER — Emergency Department (HOSPITAL_COMMUNITY): Payer: Medicare Other | Admitting: Anesthesiology

## 2017-10-13 ENCOUNTER — Inpatient Hospital Stay (HOSPITAL_COMMUNITY)
Admission: EM | Admit: 2017-10-13 | Discharge: 2017-10-15 | DRG: 690 | Disposition: A | Discharge status: Home / Self Care | Payer: Medicare Other | Attending: Internal Medicine | Admitting: Internal Medicine

## 2017-10-13 ENCOUNTER — Other Ambulatory Visit: Payer: Self-pay

## 2017-10-13 DIAGNOSIS — N12 Tubulo-interstitial nephritis, not specified as acute or chronic: Secondary | ICD-10-CM | POA: Diagnosis present

## 2017-10-13 DIAGNOSIS — Z87442 Personal history of urinary calculi: Secondary | ICD-10-CM | POA: Diagnosis not present

## 2017-10-13 DIAGNOSIS — Z87891 Personal history of nicotine dependence: Secondary | ICD-10-CM

## 2017-10-13 DIAGNOSIS — B182 Chronic viral hepatitis C: Secondary | ICD-10-CM | POA: Diagnosis present

## 2017-10-13 DIAGNOSIS — N3289 Other specified disorders of bladder: Secondary | ICD-10-CM | POA: Diagnosis present

## 2017-10-13 DIAGNOSIS — N186 End stage renal disease: Secondary | ICD-10-CM | POA: Diagnosis present

## 2017-10-13 DIAGNOSIS — Z8249 Family history of ischemic heart disease and other diseases of the circulatory system: Secondary | ICD-10-CM | POA: Diagnosis not present

## 2017-10-13 DIAGNOSIS — I2510 Atherosclerotic heart disease of native coronary artery without angina pectoris: Secondary | ICD-10-CM | POA: Diagnosis present

## 2017-10-13 DIAGNOSIS — R339 Retention of urine, unspecified: Secondary | ICD-10-CM | POA: Diagnosis present

## 2017-10-13 DIAGNOSIS — Z955 Presence of coronary angioplasty implant and graft: Secondary | ICD-10-CM | POA: Diagnosis not present

## 2017-10-13 DIAGNOSIS — I10 Essential (primary) hypertension: Secondary | ICD-10-CM | POA: Diagnosis not present

## 2017-10-13 DIAGNOSIS — F1411 Cocaine abuse, in remission: Secondary | ICD-10-CM | POA: Diagnosis present

## 2017-10-13 DIAGNOSIS — Z86711 Personal history of pulmonary embolism: Secondary | ICD-10-CM | POA: Diagnosis not present

## 2017-10-13 DIAGNOSIS — I119 Hypertensive heart disease without heart failure: Secondary | ICD-10-CM

## 2017-10-13 DIAGNOSIS — Z91018 Allergy to other foods: Secondary | ICD-10-CM | POA: Diagnosis not present

## 2017-10-13 DIAGNOSIS — I739 Peripheral vascular disease, unspecified: Secondary | ICD-10-CM | POA: Diagnosis present

## 2017-10-13 DIAGNOSIS — Z888 Allergy status to other drugs, medicaments and biological substances status: Secondary | ICD-10-CM | POA: Diagnosis not present

## 2017-10-13 DIAGNOSIS — I120 Hypertensive chronic kidney disease with stage 5 chronic kidney disease or end stage renal disease: Secondary | ICD-10-CM | POA: Diagnosis present

## 2017-10-13 DIAGNOSIS — Z882 Allergy status to sulfonamides status: Secondary | ICD-10-CM | POA: Diagnosis not present

## 2017-10-13 DIAGNOSIS — G8929 Other chronic pain: Secondary | ICD-10-CM | POA: Diagnosis present

## 2017-10-13 DIAGNOSIS — Z992 Dependence on renal dialysis: Secondary | ICD-10-CM | POA: Diagnosis not present

## 2017-10-13 DIAGNOSIS — D61818 Other pancytopenia: Secondary | ICD-10-CM | POA: Diagnosis present

## 2017-10-13 DIAGNOSIS — F1011 Alcohol abuse, in remission: Secondary | ICD-10-CM | POA: Diagnosis present

## 2017-10-13 DIAGNOSIS — M545 Low back pain: Secondary | ICD-10-CM | POA: Diagnosis present

## 2017-10-13 DIAGNOSIS — N2581 Secondary hyperparathyroidism of renal origin: Secondary | ICD-10-CM | POA: Diagnosis present

## 2017-10-13 DIAGNOSIS — K219 Gastro-esophageal reflux disease without esophagitis: Secondary | ICD-10-CM | POA: Diagnosis present

## 2017-10-13 DIAGNOSIS — Z79899 Other long term (current) drug therapy: Secondary | ICD-10-CM | POA: Diagnosis not present

## 2017-10-13 HISTORY — PX: CYSTOSCOPY W/ URETERAL STENT PLACEMENT: SHX1429

## 2017-10-13 LAB — CBC
HCT: 33.2 % — ABNORMAL LOW (ref 39.0–52.0)
Hemoglobin: 10.5 g/dL — ABNORMAL LOW (ref 13.0–17.0)
MCH: 31.8 pg (ref 26.0–34.0)
MCHC: 31.6 g/dL (ref 30.0–36.0)
MCV: 100.6 fL — AB (ref 78.0–100.0)
PLATELETS: 145 10*3/uL — AB (ref 150–400)
RBC: 3.3 MIL/uL — ABNORMAL LOW (ref 4.22–5.81)
RDW: 15.9 % — AB (ref 11.5–15.5)
WBC: 3.8 10*3/uL — AB (ref 4.0–10.5)

## 2017-10-13 LAB — BASIC METABOLIC PANEL
Anion gap: 10 (ref 5–15)
BUN: 39 mg/dL — ABNORMAL HIGH (ref 6–20)
CALCIUM: 8.8 mg/dL — AB (ref 8.9–10.3)
CO2: 29 mmol/L (ref 22–32)
CREATININE: 8.55 mg/dL — AB (ref 0.61–1.24)
Chloride: 101 mmol/L (ref 101–111)
GFR, EST AFRICAN AMERICAN: 7 mL/min — AB (ref >60)
GFR, EST NON AFRICAN AMERICAN: 6 mL/min — AB (ref >60)
GLUCOSE: 84 mg/dL (ref 65–99)
Potassium: 4.6 mmol/L (ref 3.5–5.1)
Sodium: 140 mmol/L (ref 135–145)

## 2017-10-13 SURGERY — (RADIOFREQUENCY) ABLATION
Anesthesia: General

## 2017-10-13 SURGERY — CYSTOSCOPY, WITH RETROGRADE PYELOGRAM AND URETERAL STENT INSERTION
Anesthesia: General

## 2017-10-13 MED ORDER — HYDRALAZINE HCL 20 MG/ML IJ SOLN
5.0000 mg | Freq: Once | INTRAMUSCULAR | Status: AC
  Administered 2017-10-13: 5 mg via INTRAVENOUS

## 2017-10-13 MED ORDER — MEPERIDINE HCL 50 MG/ML IJ SOLN: 6.2500 mg | INTRAMUSCULAR | Status: DC | PRN

## 2017-10-13 MED ORDER — PROPOFOL 10 MG/ML IV BOLUS
INTRAVENOUS | Status: AC
  Filled 2017-10-13: qty 20

## 2017-10-13 MED ORDER — DEXMEDETOMIDINE HCL 200 MCG/2ML IV SOLN
INTRAVENOUS | Status: DC | PRN
  Administered 2017-10-13: 16 ug via INTRAVENOUS

## 2017-10-13 MED ORDER — BELLADONNA ALKALOIDS-OPIUM 16.2-60 MG RE SUPP
RECTAL | Status: DC | PRN
  Administered 2017-10-13: 1 via RECTAL

## 2017-10-13 MED ORDER — LIDOCAINE HCL (CARDIAC) PF 50 MG/5ML IV SOSY
PREFILLED_SYRINGE | INTRAVENOUS | Status: DC | PRN
  Administered 2017-10-13: 100 mg via INTRAVENOUS

## 2017-10-13 MED ORDER — MIDAZOLAM HCL 2 MG/2ML IJ SOLN
INTRAMUSCULAR | Status: AC
  Filled 2017-10-13: qty 2

## 2017-10-13 MED ORDER — FENTANYL CITRATE (PF) 100 MCG/2ML IJ SOLN
25.0000 ug | INTRAMUSCULAR | Status: DC | PRN
  Administered 2017-10-13 (×3): 50 ug via INTRAVENOUS

## 2017-10-13 MED ORDER — SODIUM CHLORIDE 0.9 % IV SOLN
INTRAVENOUS | Status: DC | PRN
  Administered 2017-10-13: 50 mL

## 2017-10-13 MED ORDER — SODIUM CHLORIDE 0.9 % IV SOLN
INTRAVENOUS | Status: DC | PRN
  Administered 2017-10-13: 16:00:00 via INTRAVENOUS

## 2017-10-13 MED ORDER — LABETALOL HCL 5 MG/ML IV SOLN
INTRAVENOUS | Status: AC
  Filled 2017-10-13: qty 4

## 2017-10-13 MED ORDER — HYDRALAZINE HCL 20 MG/ML IJ SOLN
INTRAMUSCULAR | Status: AC
  Administered 2017-10-13: 5 mg via INTRAVENOUS
  Filled 2017-10-13: qty 1

## 2017-10-13 MED ORDER — MIDAZOLAM HCL 2 MG/2ML IJ SOLN
INTRAMUSCULAR | Status: DC | PRN
  Administered 2017-10-13: 1 mg via INTRAVENOUS

## 2017-10-13 MED ORDER — ACETAMINOPHEN 10 MG/ML IV SOLN: 1000.0000 mg | Freq: Once | INTRAVENOUS | Status: DC | PRN

## 2017-10-13 MED ORDER — FENTANYL CITRATE (PF) 100 MCG/2ML IJ SOLN
INTRAMUSCULAR | Status: AC
  Administered 2017-10-13: 50 ug via INTRAVENOUS
  Filled 2017-10-13: qty 2

## 2017-10-13 MED ORDER — LIDOCAINE 2% (20 MG/ML) 5 ML SYRINGE
INTRAMUSCULAR | Status: AC
  Filled 2017-10-13: qty 5

## 2017-10-13 MED ORDER — ONDANSETRON HCL 4 MG/2ML IJ SOLN
INTRAMUSCULAR | Status: DC | PRN
  Administered 2017-10-13: 4 mg via INTRAVENOUS

## 2017-10-13 MED ORDER — LIDOCAINE 2% (20 MG/ML) 5 ML SYRINGE
INTRAMUSCULAR | Status: DC | PRN
  Administered 2017-10-13: 60 mg via INTRAVENOUS

## 2017-10-13 MED ORDER — SODIUM CHLORIDE 0.9 % IR SOLN
Status: DC | PRN
  Administered 2017-10-13: 12000 mL

## 2017-10-13 MED ORDER — FENTANYL CITRATE (PF) 250 MCG/5ML IJ SOLN
INTRAMUSCULAR | Status: AC
  Filled 2017-10-13: qty 5

## 2017-10-13 MED ORDER — STERILE WATER FOR IRRIGATION IR SOLN
Status: DC | PRN
  Administered 2017-10-13: 1000 mL

## 2017-10-13 MED ORDER — FENTANYL CITRATE (PF) 100 MCG/2ML IJ SOLN
INTRAMUSCULAR | Status: AC
  Filled 2017-10-13: qty 2

## 2017-10-13 MED ORDER — SODIUM CHLORIDE 0.9 % IV SOLN
INTRAVENOUS | Status: DC | PRN
Start: 2017-10-13 — End: 2017-10-13
  Administered 2017-10-13: 18:00:00 via INTRAVENOUS

## 2017-10-13 MED ORDER — FENTANYL CITRATE (PF) 250 MCG/5ML IJ SOLN
INTRAMUSCULAR | Status: DC | PRN
  Administered 2017-10-13: 25 ug via INTRAVENOUS
  Administered 2017-10-13: 50 ug via INTRAVENOUS
  Administered 2017-10-13: 25 ug via INTRAVENOUS
  Administered 2017-10-13: 50 ug via INTRAVENOUS
  Administered 2017-10-13 (×2): 25 ug via INTRAVENOUS

## 2017-10-13 MED ORDER — HYDROMORPHONE HCL 1 MG/ML IJ SOLN: 0.2500 mg | INTRAMUSCULAR | Status: DC | PRN

## 2017-10-13 MED ORDER — LIDOCAINE 2% (20 MG/ML) 5 ML SYRINGE
INTRAMUSCULAR | Status: AC
Start: 2017-10-13 — End: ?
  Filled 2017-10-13: qty 10

## 2017-10-13 MED ORDER — ONDANSETRON HCL 4 MG/2ML IJ SOLN
INTRAMUSCULAR | Status: AC
  Filled 2017-10-13: qty 2

## 2017-10-13 MED ORDER — CEFAZOLIN SODIUM-DEXTROSE 2-4 GM/100ML-% IV SOLN
2.0000 g | INTRAVENOUS | Status: AC
  Administered 2017-10-13: 2 g via INTRAVENOUS
  Filled 2017-10-13: qty 100

## 2017-10-13 MED ORDER — DEXMEDETOMIDINE HCL IN NACL 200 MCG/50ML IV SOLN
INTRAVENOUS | Status: AC
  Filled 2017-10-13: qty 50

## 2017-10-13 MED ORDER — PROPOFOL 10 MG/ML IV BOLUS
INTRAVENOUS | Status: DC | PRN
  Administered 2017-10-13: 120 mg via INTRAVENOUS

## 2017-10-13 MED ORDER — LABETALOL HCL 5 MG/ML IV SOLN
INTRAVENOUS | Status: DC | PRN
  Administered 2017-10-13 (×2): 2.5 mg via INTRAVENOUS

## 2017-10-13 MED ORDER — BELLADONNA-OPIUM 16.2-30 MG RE SUPP
RECTAL | Status: AC
  Filled 2017-10-13: qty 1

## 2017-10-13 MED ORDER — SUCCINYLCHOLINE CHLORIDE 200 MG/10ML IV SOSY
PREFILLED_SYRINGE | INTRAVENOUS | Status: AC
  Filled 2017-10-13: qty 10

## 2017-10-13 MED ORDER — PROPOFOL 10 MG/ML IV BOLUS
INTRAVENOUS | Status: DC | PRN
  Administered 2017-10-13: 100 mg via INTRAVENOUS

## 2017-10-13 MED ORDER — PROMETHAZINE HCL 25 MG/ML IJ SOLN: 6.2500 mg | INTRAMUSCULAR | Status: DC | PRN

## 2017-10-13 MED ORDER — DEXAMETHASONE SODIUM PHOSPHATE 10 MG/ML IJ SOLN
INTRAMUSCULAR | Status: AC
  Filled 2017-10-13: qty 1

## 2017-10-13 MED ORDER — HYDROCODONE-ACETAMINOPHEN 7.5-325 MG PO TABS: 1.0000 | ORAL_TABLET | Freq: Once | ORAL | Status: DC | PRN

## 2017-10-13 MED ORDER — FENTANYL CITRATE (PF) 100 MCG/2ML IJ SOLN
INTRAMUSCULAR | Status: DC | PRN
  Administered 2017-10-13 (×2): 50 ug via INTRAVENOUS
  Administered 2017-10-13: 100 ug via INTRAVENOUS
  Administered 2017-10-13: 50 ug via INTRAVENOUS

## 2017-10-13 MED ORDER — SODIUM CHLORIDE 0.9 % IR SOLN
Status: DC | PRN
  Administered 2017-10-13 (×6): 3000 mL via INTRAVESICAL

## 2017-10-13 MED ORDER — SODIUM CHLORIDE 0.9 % IR SOLN
Status: DC | PRN
  Administered 2017-10-13: 1000 mL via INTRAVESICAL

## 2017-10-13 SURGICAL SUPPLY — 39 items
ADAPTER CATH URET PLST 4-6FR (CATHETERS) IMPLANT
BAG URINE DRAINAGE (UROLOGICAL SUPPLIES) ×2 IMPLANT
BAG URO CATCHER STRL LF (MISCELLANEOUS) ×2 IMPLANT
BENZOIN TINCTURE PRP APPL 2/3 (GAUZE/BANDAGES/DRESSINGS) IMPLANT
BLADE 10 SAFETY STRL DISP (BLADE) ×2 IMPLANT
BUCKET BIOHAZARD WASTE 5 GAL (MISCELLANEOUS) IMPLANT
CATH FOLEY 2WAY SLVR  5CC 16FR (CATHETERS)
CATH FOLEY 2WAY SLVR 5CC 16FR (CATHETERS) IMPLANT
CATH HEMA 3WAY 30CC 24FR COUDE (CATHETERS) ×4 IMPLANT
CATH INTERMIT  6FR 70CM (CATHETERS) IMPLANT
CATH URET 5FR 28IN CONE TIP (BALLOONS)
CATH URET 5FR 70CM CONE TIP (BALLOONS) IMPLANT
COVER SURGICAL LIGHT HANDLE (MISCELLANEOUS) ×2 IMPLANT
DRAPE CAMERA CLOSED 9X96 (DRAPES) ×2 IMPLANT
GLOVE BIO SURGEON STRL SZ7 (GLOVE) ×2 IMPLANT
GLOVE BIOGEL PI IND STRL 7.0 (GLOVE) ×1 IMPLANT
GLOVE BIOGEL PI INDICATOR 7.0 (GLOVE) ×1
GLOVE INDICATOR 7.0 STRL GRN (GLOVE) ×2 IMPLANT
GLOVE SURG SS PI 7.0 STRL IVOR (GLOVE) ×2 IMPLANT
GOWN STRL REUS W/ TWL XL LVL3 (GOWN DISPOSABLE) ×2 IMPLANT
GOWN STRL REUS W/TWL XL LVL3 (GOWN DISPOSABLE) ×2
GUIDEWIRE ANG ZIPWIRE 038X150 (WIRE) ×2 IMPLANT
GUIDEWIRE COOK  .035 (WIRE) IMPLANT
GUIDEWIRE STR DUAL SENSOR (WIRE) ×4 IMPLANT
KIT TURNOVER KIT B (KITS) ×2 IMPLANT
MANIFOLD NEPTUNE II (INSTRUMENTS) IMPLANT
NS IRRIG 1000ML POUR BTL (IV SOLUTION) ×2 IMPLANT
PACK CYSTO (CUSTOM PROCEDURE TRAY) ×2 IMPLANT
PAD ARMBOARD 7.5X6 YLW CONV (MISCELLANEOUS) ×4 IMPLANT
PLUG CATH AND CAP STER (CATHETERS) IMPLANT
STENT POLARIS 5FRX24 (STENTS) IMPLANT
STENT POLARIS 5FRX26 (STENTS) IMPLANT
STENT POLARIS 5FRX28 (STENTS) IMPLANT
STENT URET 6FRX24 CONTOUR (STENTS) IMPLANT
SYR CONTROL 10ML LL (SYRINGE) ×2 IMPLANT
SYRINGE TOOMEY DISP (SYRINGE) IMPLANT
UNDERPAD 30X30 (UNDERPADS AND DIAPERS) ×2 IMPLANT
WATER STERILE IRR 1000ML POUR (IV SOLUTION) ×2 IMPLANT
WIRE COONS/BENSON .038X145CM (WIRE) IMPLANT

## 2017-10-13 SURGICAL SUPPLY — 22 items
BAG URINE DRAINAGE (UROLOGICAL SUPPLIES) ×4 IMPLANT
BAG URO CATCHER STRL LF (MISCELLANEOUS) ×2 IMPLANT
CATH FOLEY 3WAY 30CC 22FR (CATHETERS) IMPLANT
CATH FOLEY 3WAY 30CC 24FR (CATHETERS) ×1
CATH URTH STD 24FR FL 3W 2 (CATHETERS) ×1 IMPLANT
COVER FOOTSWITCH UNIV (MISCELLANEOUS) IMPLANT
COVER SURGICAL LIGHT HANDLE (MISCELLANEOUS) ×2 IMPLANT
GLOVE BIOGEL M STRL SZ7.5 (GLOVE) ×2 IMPLANT
GOWN STRL REUS W/TWL LRG LVL3 (GOWN DISPOSABLE) ×2 IMPLANT
GOWN STRL REUS W/TWL XL LVL3 (GOWN DISPOSABLE) ×2 IMPLANT
GUIDEWIRE STR DUAL SENSOR (WIRE) ×2 IMPLANT
HOLDER FOLEY CATH W/STRAP (MISCELLANEOUS) IMPLANT
LOOP CUT BIPOLAR 24F LRG (ELECTROSURGICAL) ×4 IMPLANT
MANIFOLD NEPTUNE II (INSTRUMENTS) ×2 IMPLANT
PACK CYSTO (CUSTOM PROCEDURE TRAY) ×2 IMPLANT
PIN SAFETY STERILE (MISCELLANEOUS) ×2 IMPLANT
PLUG CATH AND CAP STER (CATHETERS) ×2 IMPLANT
RUBBERBAND STERILE (MISCELLANEOUS) ×2 IMPLANT
SET ASPIRATION TUBING (TUBING) IMPLANT
SYRINGE IRR TOOMEY STRL 70CC (SYRINGE) ×4 IMPLANT
TUBING CONNECTING 10 (TUBING) ×2 IMPLANT
TUBING UROLOGY SET (TUBING) ×2 IMPLANT

## 2017-10-13 NOTE — ED Notes (Signed)
ED Provider at bedside. 

## 2017-10-13 NOTE — Transfer of Care (Signed)
Immediate Anesthesia Transfer of Care Note  Patient: [facility]  Procedure(s) Performed: TRANSURETHRAL RESECTION/CYSTOSCOPY/BLADDER EVACUATION/POSSIBLE CYSTOTOMY, POSSIBLE  suprapubic tube placement (N/A )  Patient Location: PACU  Anesthesia Type:General  Level of Consciousness: awake, alert , oriented and patient cooperative  Airway & Oxygen Therapy: Patient Spontanous Breathing and Patient connected to face mask oxygen  Post-op Assessment: Report given to RN, Post -op Vital signs reviewed and stable and Patient moving all extremities X 4  Post vital signs: stable  Last Vitals:  Vitals Value Taken Time  BP 171/83 10/13/2017 11:02 PM  Temp 36.4 C 10/13/2017 11:02 PM  Pulse 88 10/13/2017 11:10 PM  Resp 15 10/13/2017 11:10 PM  SpO2 100 % 10/13/2017 11:10 PM  Vitals shown include unvalidated device data.  Last Pain:  Vitals:   10/13/17 2302  TempSrc:   PainSc: Asleep         Complications: No apparent anesthesia complications

## 2017-10-13 NOTE — Anesthesia Procedure Notes (Signed)
Procedure Name: LMA Insertion Date/Time: 10/13/2017 9:16 PM Performed by: Lissa Morales, CRNA Pre-anesthesia Checklist: Patient identified, Emergency Drugs available, Suction available and Patient being monitored Patient Re-evaluated:Patient Re-evaluated prior to induction Oxygen Delivery Method: Circle system utilized Preoxygenation: Pre-oxygenation with 100% oxygen Induction Type: IV induction LMA: LMA with gastric port inserted LMA Size: 4.0 Tube type: Oral Number of attempts: 1 Airway Equipment and Method: Oral airway Placement Confirmation: positive ETCO2 Tube secured with: Tape Dental Injury: Teeth and Oropharynx as per pre-operative assessment

## 2017-10-13 NOTE — Op Note (Signed)
Operative Note  Preoperative diagnosis:  1.  Pyelocystitis 2.  History of bladder augmentation 3.  End-stage renal disease  Postoperative diagnosis: 1.  Pyelocystitis 2.  History of bladder augmentation 3.  End-stage renal disease  Procedure(s): 1.  Cystoscopy with TUR of mucous plug 2.  Cystogram  Surgeon: Ellison Hughs, MD  Assistants:  Fredrik Rigger, MD   Anesthesia: General LMA  Complications: None  EBL: 200 mL  Specimens: 1.  None  Drains/Catheters: 1.  24 French three-way Foley catheter with 15 mL in the balloon  Intraoperative findings:   1. Solid bladder mucous plug with an estimated volume of 900 mL 2. No evidence of bladder perforation on cystogram at the conclusion of the case  Indication:  Logan Martin is a 62 y.o. male with a history of end-stage renal disease with severe oliguria and is currently being treated for suspected pyelocystitis.  He underwent cystoscopy with bladder evacuation at [facility], but due to deficits in the availability of equipment, the patient had to be transferred to [facility] to continue the evacuation of his bladder. He was found to have a solid mucous plug that likely accumulated from his augmented bladder that could not be evacuated with irrigation through the cystoscope sheath.  He has been consented for the above procedures, voices understanding wishes to proceed  Description of procedure:  After informed consent was obtained, the patient was brought to the operating room and general LMA anesthesia was administered. The patient was then placed in the dorsolithotomy position and prepped and draped in usual sterile fashion. A timeout was performed.  A 26 French resectoscope with a loop working element was then inserted into the bladder.  A bladder survey revealed a large mucous plug that could not be irrigated through the 26 [place] sheath with bladder irrigation.  The loop of the TUR was then used to agitate the mucous  plug until we could partially irrigate his bladder through the sheath of the scope.  We then began resecting the peripheral edges of the mucous plug into numerous smaller pieces that could be evacuated out through the resectoscope sheath.  Once all mucous plug and clot was evacuated from the lumen of the bladder, complete survey revealed no additional intravesical pathology.  A cystogram was obtained that showed no evidence of contrast extravasation beyond the limits of his augmented bladder.  A 24 French three-way Foley catheter was then inserted into the bladder over a wire and the balloon was inflated with 15 mL.  The catheter was placed to gravity drainage and started on continuous bladder irrigation.  The patient tolerated the procedure well and was transferred to the postanesthesia unit in stable condition.  Plan: CBI overnight

## 2017-10-13 NOTE — Transfer of Care (Signed)
Immediate Anesthesia Transfer of Care Note  Patient: Logan [facility]  Procedure(s) Performed: CYSTOSCOPY WITH IRRIGATION OF BLADDER PLACEMENT OF FOLEY (N/A )  Patient Location: PACU  Anesthesia Type:General  Level of Consciousness: awake, alert  and oriented  Airway & Oxygen Therapy: Patient connected to nasal cannula oxygen  Post-op Assessment: Report given to RN and Post -op Vital signs reviewed and stable  Post vital signs: Reviewed and stable  Last Vitals:  Vitals Value Taken Time  BP 173/102 10/13/2017  7:44 PM  Temp    Pulse 84 10/13/2017  7:45 PM  Resp 23 10/13/2017  7:45 PM  SpO2 97 % 10/13/2017  7:45 PM  Vitals shown include unvalidated device data.  Last Pain:  Vitals:   10/13/17 1607  TempSrc:   PainSc: 10-Worst pain ever         Complications: No apparent anesthesia complications

## 2017-10-13 NOTE — Anesthesia Preprocedure Evaluation (Addendum)
Anesthesia Evaluation  Patient identified by MRN, date of birth, ID band Patient awake    Reviewed: Allergy & Precautions, H&P , NPO status , Patient's Chart, lab work & pertinent test results, reviewed documented beta blocker date and time   Airway Mallampati: III  TM Distance: >3 FB Neck ROM: Full    Dental no notable dental hx. (+) Teeth Intact, Dental Advisory Given   Pulmonary neg pulmonary ROS, former smoker,    Pulmonary exam normal breath sounds clear to auscultation       Cardiovascular hypertension, Pt. on medications and Pt. on home beta blockers + Peripheral Vascular Disease   Rhythm:Regular Rate:Normal     Neuro/Psych negative neurological ROS  negative psych ROS   GI/Hepatic negative GI ROS, (+) Hepatitis -, C  Endo/Other  negative endocrine ROS  Renal/GU ESRF and DialysisRenal disease  negative genitourinary   Musculoskeletal   Abdominal   Peds  Hematology negative hematology ROS (+) anemia ,   Anesthesia Other Findings   Reproductive/Obstetrics negative OB ROS                            Anesthesia Physical Anesthesia Plan  ASA: III  Anesthesia Plan: General   Post-op Pain Management:    Induction: Intravenous  PONV Risk Score and Plan: 3 and Ondansetron and Midazolam  Airway Management Planned: LMA  Additional Equipment:   Intra-op Plan:   Post-operative Plan: Extubation in OR  Informed Consent: I have reviewed the patients History and Physical, chart, labs and discussed the procedure including the risks, benefits and alternatives for the proposed anesthesia with the patient or authorized representative who has indicated his/her understanding and acceptance.   Dental advisory given  Plan Discussed with: CRNA  Anesthesia Plan Comments:         Anesthesia Quick Evaluation

## 2017-10-13 NOTE — ED Provider Notes (Signed)
[facility] Provider Note   CSN: [id number] Arrival date & time: 10/13/17  1327     History   Chief Complaint Chief Complaint  Patient presents with  . Urinary Retention    HPI Logan Martin is a 62 y.o. male.  HPI  Patient is a 62 year old male with a history of ESRD and bladder augmentation surgery who comes in today complaining of bladder fullness.  Patient states that he does not produce any more urine, however secondary to the bladder augmentation surgery, his bladder will fill every 2 to 3 years with "mucus."  Patient states that he has to be "put to sleep" and have a catheter placed with irrigation by urology.  Patient denies any fevers chills nausea vomiting endorses suprapubic fullness.   Past Medical History:  Diagnosis Date  . Alcohol abuse    quit date in 1995  . Anemia   . Chronic low back pain   . Cocaine abuse in remission ([place])    Quit date in 1995  . 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 Started HD in [place].  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  On NxStage home hemo since 2010. Using RUA AVF.    [name] ESRD on dialysis ([place])    "TTS; [place]" (06/14/2016)  . Hepatitis C   . History of blood transfusion 2007 X 1  . History of kidney stones   . Hypertension   . Peripheral vascular disease ([place])   . Pulmonary embolism [facility]) Feb 2012   Treated with coumadin x 1 year  . Renal insufficiency     Patient Active Problem List   Diagnosis Date Noted  . Pyelocystitis 10/13/2017  . Pancytopenia ([place]) 06/14/2016  . Left arm pain 06/14/2016  . 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 Mechanical complication of other vascular device, implant, and graft 08/10/2013  . End stage renal disease ([place]) 08/10/2013  . ESRD (end stage renal disease) ([place]) 06/07/2013  . Nausea and vomiting 06/07/2013  . SOB (shortness of breath) 05/10/2013  . Chest discomfort  11/27/2012  . Shortness of breath 11/27/2012  . Chronic hepatitis C without hepatic coma ([place]) 02/04/2012  . Secondary hyperparathyroidism (of renal origin) 02/04/2012  . Anemia associated with chronic renal failure 02/04/2012  . CAD (coronary atherosclerotic disease) 02/04/2012  . Pulmonary embolism ([place]) 07/20/2010  . CORONARY ARTERY DISEASE 02/23/2009  . HEMORRHOIDS, INTERNAL 02/23/2009  . GERD 02/23/2009  . ESRD on dialysis [facility]) 02/16/2009    Past Surgical History:  Procedure Laterality Date  . ABDOMINAL HERNIA REPAIR  ~ 2000 X 2; 2001 X1  . AV FISTULA PLACEMENT Right 2007   upper arm  . AV FISTULA REPAIR Right ~ 03/2016 & 04/2016  . BLADDER AUGMENTATION  1975  . CORONARY ANGIOPLASTY WITH STENT PLACEMENT  1999  . EXTRACORPOREAL SHOCK WAVE LITHOTRIPSY    . HERNIA REPAIR    . INGUINAL HERNIA REPAIR Right 1975  . PERIPHERAL VASCULAR CATHETERIZATION N/A 06/22/2016   Procedure: Left Arm Venography;  Surgeon: Elam Dutch, MD;  Location: [place] [facility];  Service: Cardiovascular;  Laterality: N/A;  . PERIPHERAL VASCULAR CATHETERIZATION N/A 06/22/2016   Procedure: A/V Fistulagram - Right Arm;  Surgeon: Elam Dutch, MD;  Location: [place] [facility];  Service: Cardiovascular;  Laterality: N/A;  . PERIPHERAL VASCULAR CATHETERIZATION Right 06/22/2016   Procedure: Peripheral Vascular Balloon Angioplasty;  Surgeon: Elam Dutch, MD;  Location:  [place] IN[facility];  Service: Cardiovascular;  Laterality: Right;  arm fistula        Home Medications    Prior to Admission medications   Medication Sig Start Date End Date Taking? Authorizing Provider  calcium acetate (PHOSLO) 667 MG capsule Take 1,334 mg by mouth 3 (three) times daily with meals.    Yes [provider]  hydrALAZINE (APRESOLINE) 50 MG tablet Take 50 mg by mouth 3 (three) times daily. 05/11/16  Yes [provider]  labetalol (NORMODYNE) 200 MG tablet Take 200 mg by mouth 2 (two) times daily.  05/11/16  Yes [provider]  oxycodone (ROXICODONE) 30 MG immediate release tablet Take 30 mg by mouth 3 (three) times daily.   Yes [provider]  Oxycodone HCl 10 MG TABS Take 10 mg by mouth 3 (three) times daily as needed (for pain).  05/22/16  Yes [provider]  cephALEXin (KEFLEX) 500 MG capsule Take 1 capsule (500 mg total) by mouth 2 (two) times daily. Patient not taking: Reported on 10/13/2017 01/16/17   Margarita Mail, PA-C  ciprofloxacin (CIPRO) 500 MG tablet Take 500 mg by mouth every 24 hours. On the days that you dialyze, make sure to take the medication after your dialysis. Patient not taking: Reported on 10/13/2017 05/11/17   Margarita Mail, PA-C  Glecaprevir-Pibrentasvir (MAVYRET) 100-40 MG TABS Take 3 tablets by mouth daily. Patient not taking: Reported on 01/15/2017 08/15/16   Thayer Headings, MD  HYDROcodone-acetaminophen (NORCO/VICODIN) 5-325 MG tablet Take one tablet every 8-12 hours for severe pain. Patient not taking: Reported on 10/13/2017 05/11/17   Margarita Mail, PA-C  multivitamin (RENA-VIT) TABS tablet Take 1 tablet by mouth at bedtime. Patient not taking: Reported on 10/13/2017 06/09/13   Murlean Iba, MD  neomycin-polymyxin-hydrocortisone (CORTISPORIN) 3.5-10000-1 OTIC suspension Place 4 drops into the left ear 3 (three) times daily. Patient not taking: Reported on 10/13/2017 05/11/17   Margarita Mail, PA-C    Family History Family History  Problem Relation Age of Onset  . Cancer Mother   . Diabetes Mother   . Hypertension Mother   . Deep vein thrombosis Sister   . Deep vein thrombosis Daughter     Social History Social History   Tobacco Use  . Smoking status: Former Smoker    Packs/day: 1.00    Years: 5.00    Pack years: 5.00    Types: Cigarettes    Last attempt to quit: 05/28/1993    Years since quitting: 24.3  . Smokeless tobacco: Never Used  Substance Use Topics  . Alcohol use: No    Comment: 06/14/2016  "nothing since 1995"  . Drug use: No    Types: Marijuana, Cocaine    Comment: 06/14/2016 "nothing since 1995"     Allergies   Heparin; Pork-derived products; and Sulfa antibiotics   Review of Systems Review of Systems  Constitutional: Negative for chills, fatigue and fever.  Genitourinary: Positive for urgency.  All other systems reviewed and are negative.    Physical Exam Updated Vital Signs BP (!) 162/58   Pulse 90   Temp 97.6 F (36.4 C)   Resp 16   Ht 5\' 4"  (1.626 m)   Wt 56.7 kg (125 lb)   SpO2 100%   BMI 21.46 kg/m   Physical Exam  Constitutional: He appears well-developed and well-nourished.  HENT:  Head: Normocephalic and atraumatic.  Eyes: Conjunctivae are normal.  Neck: Neck supple.  Cardiovascular: Normal rate and regular rhythm.  No murmur  heard. Pulmonary/Chest: Effort normal and breath sounds normal. No respiratory distress.  Abdominal: Soft.  Suprapubic fullness w/ TTP  Musculoskeletal: He exhibits no edema.  Neurological: He is alert.  Skin: Skin is warm and dry.  Psychiatric: He has a normal mood and affect.  Nursing note and vitals reviewed.    ED Treatments / Results  Labs (all labs ordered are listed, but only abnormal results are displayed) Labs Reviewed  CBC - Abnormal; Notable for the following components:      Result Value   WBC 3.8 (*)    RBC 3.30 (*)    Hemoglobin 10.5 (*)    HCT 33.2 (*)    MCV 100.6 (*)    RDW 15.9 (*)    Platelets 145 (*)    All other components within normal limits  BASIC METABOLIC PANEL - Abnormal; Notable for the following components:   BUN 39 (*)    Creatinine, Ser 8.55 (*)    Calcium 8.8 (*)    GFR calc non Af Amer 6 (*)    GFR calc Af Amer 7 (*)    All other components within normal limits    EKG None  Radiology Dg C-arm 1-60 Min-no Report  Result Date: 10/13/2017 Fluoroscopy was utilized by the requesting physician.  No radiographic interpretation.    Procedures Procedures  (including critical care time)  Medications Ordered in ED Medications  fentaNYL (SUBLIMAZE) 100 MCG/2ML injection (has no administration in time range)  HYDROcodone-acetaminophen (NORCO) 7.5-325 MG per tablet 1 tablet (has no administration in time range)  HYDROmorphone (DILAUDID) injection 0.25-0.5 mg (has no administration in time range)  acetaminophen (OFIRMEV) IV 1,000 mg (has no administration in time range)  meperidine (DEMEROL) injection 6.25-12.5 mg (has no administration in time range)  promethazine (PHENERGAN) injection 6.25-12.5 mg (has no administration in time range)  ceFAZolin (ANCEF) IVPB 2g/100 mL premix (2 g Intravenous Given 10/13/17 1824)  hydrALAZINE (APRESOLINE) injection 5 mg (5 mg Intravenous Given 10/13/17 2000)     Initial Impression / Assessment and Plan / ED Course  I have reviewed the triage vital signs and the nursing notes.  Pertinent labs & imaging results that were available during my care of the patient were reviewed by me and considered in my medical decision making (see chart for details).    Bedside ultrasound reveals full bladder with heterogeneous material throughout.   Urology consulted to state that they will see the patient.  Recommend admission to medicine for further observation status post irrigation.  Patient transferred to [facility] long.  Final Clinical Impressions(s) / ED Diagnoses   Final diagnoses:  Pancytopenia ([place])  Dependence on renal dialysis ([place])  Chronic hepatitis C without hepatic coma ([place])  Secondary renal hyperparathyroidism [facility])    ED Discharge Orders    None       Chapman Moss, MD 10/14/17 0000    Elnora Morrison, MD 10/14/17 [phone]

## 2017-10-13 NOTE — Anesthesia Procedure Notes (Signed)
Procedure Name: LMA Insertion Date/Time: 10/13/2017 6:22 PM Performed by: Clearnce Sorrel, CRNA Pre-anesthesia Checklist: Patient identified, Emergency Drugs available, Suction available, Patient being monitored and Timeout performed Patient Re-evaluated:Patient Re-evaluated prior to induction Oxygen Delivery Method: Circle system utilized Preoxygenation: Pre-oxygenation with 100% oxygen Induction Type: IV induction LMA: LMA inserted LMA Size: 4.0 Number of attempts: 1 Placement Confirmation: positive ETCO2 and breath sounds checked- equal and bilateral Tube secured with: Tape Dental Injury: Teeth and Oropharynx as per pre-operative assessment

## 2017-10-13 NOTE — Anesthesia Preprocedure Evaluation (Signed)
Anesthesia Evaluation  Patient identified by MRN, date of birth, ID band Patient awake    Reviewed: Allergy & Precautions, H&P , NPO status , Patient's Chart, lab work & pertinent test results, reviewed documented beta blocker date and time   Airway Mallampati: III  TM Distance: >3 FB Neck ROM: Full    Dental no notable dental hx. (+) Teeth Intact, Dental Advisory Given   Pulmonary neg pulmonary ROS, former smoker,    Pulmonary exam normal breath sounds clear to auscultation       Cardiovascular hypertension, Pt. on medications and Pt. on home beta blockers + Peripheral Vascular Disease   Rhythm:Regular Rate:Normal     Neuro/Psych negative neurological ROS  negative psych ROS   GI/Hepatic negative GI ROS, GERD  ,(+) Hepatitis -, C  Endo/Other  negative endocrine ROS  Renal/GU ESRF and DialysisRenal disease  negative genitourinary   Musculoskeletal   Abdominal   Peds  Hematology negative hematology ROS (+) anemia ,   Anesthesia Other Findings Pt Transferred to [facility] from [facility] at [facility]  ALL : Heparin  Reproductive/Obstetrics negative OB ROS                             Anesthesia Physical Anesthesia Plan  ASA: III and emergent  Anesthesia Plan: General   Post-op Pain Management:    Induction: Intravenous and Cricoid pressure planned  PONV Risk Score and Plan: Treatment may vary due to age or medical condition  Airway Management Planned: LMA  Additional Equipment:   Intra-op Plan:   Post-operative Plan: Extubation in OR  Informed Consent: I have reviewed the patients History and Physical, chart, labs and discussed the procedure including the risks, benefits and alternatives for the proposed anesthesia with the patient or authorized representative who has indicated his/her understanding and acceptance.   Dental advisory given  Plan Discussed with:   Anesthesia Plan Comments:          Anesthesia Quick Evaluation

## 2017-10-13 NOTE — Progress Notes (Signed)
Pt arrived to [facility] via [place] transport. 3 bags of belongings. Pt alert and oriented, very HOH. C/o 10/10 pain to bladder and "intense pressure". Dr. Valma Cava (anesthesia) at bedside. OR aware of pt's arrival.  Coolidge Breeze, RN 10/13/2017

## 2017-10-13 NOTE — Progress Notes (Signed)
Patient received from OR at 25. HTN and pain 10/10. Dr. Lovena Neighbours orders pt to proceed to transfer to [facility] for additional treatment in OR. Dr. Jefm Petty at bedside. meds for bp and pain given.. Daughter updated via phone . All belongings including cell phone with patient

## 2017-10-13 NOTE — ED Triage Notes (Signed)
Pt. Stated, Im here to have my bladder drained. I dion;t use a regular catheter, they have to put me to sleep to use a specific catheter.

## 2017-10-13 NOTE — Consult Note (Addendum)
Urology Consult Note   Requesting Attending Physician:  Elnora Morrison, MD Service Providing Consult: Urology  Consulting Attending: Louis Meckel     Reason for Consult:  Symptomatic Pyocystis   HPI: Logan Martin is seen in consultation for reasons noted above at the request of Elnora Morrison, MD for evaluation of suprapubic pain and pyocystis .  This is a 62 y.o. male with hx of ESRD on dialysis and bladder augmentation for non compliant bladder in setting of reflux. Over the past few days has had suprapubic pain and tenderness as well as mucus draining from his penis.   Has a hx of pyocystis requiring irrigation and drainage in the operating room. Most recently this happened two years ago in [place] - catheter placement at bedside was difficult.   Patient is AF and stable.  WBC - 3.8 Cr - 8.55 Bedside ultrasound by ED - dense heterogenous material in the bladder to the level of the umbilicus ( unable to view images but discussed with our Ed colleagues)   Offered bedside foley placement but give prior difficult attempts the patient desired irrigation in OR  Past Medical History: Past Medical History:  Diagnosis Date  . Alcohol abuse    quit date in 1995  . Anemia   . Chronic low back pain   . Cocaine abuse in remission ([place])    Quit date in 1995  . 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 Started HD in [place], [place].  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  On NxStage home hemo since 2010. Using RUA AVF.    [name] ESRD on dialysis ([place])    "TTS; [place]" (06/14/2016)  . Hepatitis C   . History of blood transfusion 2007 X 1  . History of kidney stones   . Hypertension   . Peripheral vascular disease ([place])   . Pulmonary embolism [facility]) Feb 2012   Treated with coumadin x 1 year  . Renal insufficiency     Past Surgical History:  Past Surgical History:  Procedure Laterality Date  . ABDOMINAL HERNIA REPAIR  ~ 2000 X 2; 2001 X1  .  AV FISTULA PLACEMENT Right 2007   upper arm  . AV FISTULA REPAIR Right ~ 03/2016 & 04/2016  . BLADDER AUGMENTATION  1975  . CORONARY ANGIOPLASTY WITH STENT PLACEMENT  1999  . EXTRACORPOREAL SHOCK WAVE LITHOTRIPSY    . HERNIA REPAIR    . INGUINAL HERNIA REPAIR Right 1975  . PERIPHERAL VASCULAR CATHETERIZATION N/A 06/22/2016   Procedure: Left Arm Venography;  Surgeon: Elam Dutch, MD;  Location: [place] [facility];  Service: Cardiovascular;  Laterality: N/A;  . PERIPHERAL VASCULAR CATHETERIZATION N/A 06/22/2016   Procedure: A/V Fistulagram - Right Arm;  Surgeon: Elam Dutch, MD;  Location: [place] [facility];  Service: Cardiovascular;  Laterality: N/A;  . PERIPHERAL VASCULAR CATHETERIZATION Right 06/22/2016   Procedure: Peripheral Vascular Balloon Angioplasty;  Surgeon: Elam Dutch, MD;  Location: [place] [facility];  Service: Cardiovascular;  Laterality: Right;  arm fistula    Medication: Current Facility-Administered Medications  Medication Dose Route Frequency Provider Last Rate Last Dose  . ceFAZolin (ANCEF) IVPB 2g/100 mL premix  2 g Intravenous To OR Mack Alvidrez L, MD       Current Outpatient Medications  Medication Sig Dispense Refill  . calcium acetate (PHOSLO) 667 MG capsule Take 1,334 mg by mouth 3 (three) times daily with meals.     . hydrALAZINE (  APRESOLINE) 50 MG tablet Take 50 mg by mouth 3 (three) times daily.  3  . labetalol (NORMODYNE) 200 MG tablet Take 200 mg by mouth 2 (two) times daily.  4  . oxycodone (ROXICODONE) 30 MG immediate release tablet Take 30 mg by mouth 3 (three) times daily.    . Oxycodone HCl 10 MG TABS Take 10 mg by mouth 3 (three) times daily as needed (for pain).   0  . cephALEXin (KEFLEX) 500 MG capsule Take 1 capsule (500 mg total) by mouth 2 (two) times daily. (Patient not taking: Reported on 10/13/2017) 14 capsule 0  . ciprofloxacin (CIPRO) 500 MG tablet Take 500 mg by mouth every 24 hours. On the days that you dialyze, make sure to  take the medication after your dialysis. (Patient not taking: Reported on 10/13/2017) 7 tablet 0  . Glecaprevir-Pibrentasvir (MAVYRET) 100-40 MG TABS Take 3 tablets by mouth daily. (Patient not taking: Reported on 01/15/2017) 84 tablet 1  . HYDROcodone-acetaminophen (NORCO/VICODIN) 5-325 MG tablet Take one tablet every 8-12 hours for severe pain. (Patient not taking: Reported on 10/13/2017) 10 tablet 0  . multivitamin (RENA-VIT) TABS tablet Take 1 tablet by mouth at bedtime. (Patient not taking: Reported on 10/13/2017) 30 tablet 0  . neomycin-polymyxin-hydrocortisone (CORTISPORIN) 3.5-10000-1 OTIC suspension Place 4 drops into the left ear 3 (three) times daily. (Patient not taking: Reported on 10/13/2017) 10 mL 0    Allergies: Allergies  Allergen Reactions  . Heparin Rash    Pork products   . Pork-Derived Products Rash  . Sulfa Antibiotics Rash    Social History: Social History   Tobacco Use  . Smoking status: Former Smoker    Packs/day: 1.00    Years: 5.00    Pack years: 5.00    Types: Cigarettes    Last attempt to quit: 05/28/1993    Years since quitting: 24.3  . Smokeless tobacco: Never Used  Substance Use Topics  . Alcohol use: No    Comment: 06/14/2016 "nothing since 1995"  . Drug use: No    Types: Marijuana, Cocaine    Comment: 06/14/2016 "nothing since 1995"    Family History Family History  Problem Relation Age of Onset  . Cancer Mother   . Diabetes Mother   . Hypertension Mother   . Deep vein thrombosis Sister   . Deep vein thrombosis Daughter     Review of Systems 10 systems were reviewed and are negative except as noted specifically in the HPI.  Objective   Vital signs in last 24 hours: BP (!) 152/79   Pulse 80   Temp 99.4 F (37.4 C) (Oral)   Resp 16   Ht 5\' 4"  (1.626 m)   Wt 56.7 kg (125 lb)   SpO2 100%   BMI 21.46 kg/m   Physical Exam General: NAD, A&O, resting, appropriate HEENT: [place]/AT, EOMI, MMM Pulmonary: Normal work of  breathing Cardiovascular: HDS, adequate peripheral perfusion Abdomen: Soft, NTTP, nondistended, . GU: tender suprapubic region Extremities: warm and well perfused Neuro: Appropriate, no focal neurological deficits  Most Recent Labs: Lab Results  Component Value Date   WBC 3.8 (L) 10/13/2017   HGB 10.5 (L) 10/13/2017   HCT 33.2 (L) 10/13/2017   PLT 145 (L) 10/13/2017    Lab Results  Component Value Date   NA 140 10/13/2017   K 4.6 10/13/2017   CL 101 10/13/2017   CO2 29 10/13/2017   BUN 39 (H) 10/13/2017   CREATININE 8.55 (H) 10/13/2017   CALCIUM 8.8 (  L) 10/13/2017   MG 2.1 11/28/2010   PHOS 4.7 (H) 06/17/2016   PHOS 4.6 06/17/2016    Lab Results  Component Value Date   INR 1.10 12/06/2010   APTT 41 (H) 12/06/2010     IMAGING: No results found.  ------  Assessment:  62 y.o. male with ESRD and bladder augmentation w/ pyocystis   Recommendations: - to OR for irrigation and drainage  - Risks and benefits of procedure discussed in detail    Thank you for this consult. Please contact the urology consult pager with any further questions/concerns.

## 2017-10-13 NOTE — Op Note (Signed)
PATIENT:  Logan Martin  Preoperative diagnosis:  1. Pyocystis    Postoperative diagnosis:  1. Same    Procedure:  1. Cystoscopy 2. Evacuation and irrigation of bladder  3. Foley catheter insertion  Surgeon: Ellison Hughs , M.D.  Resident: Barnabas Harries.  Anesthesia: General  Complications: None  EBL: Minimal  Specimens: None  Indication: Logan Martin is a 62 y.o. male with hx of ESRD on dialysis and bladder augment with pyocystis - symptomatic. After reviewing the management options for treatment, they have elected to proceed with the above surgical procedure(s). We have discussed the potential benefits and risks of the procedure, side effects of the proposed treatment, the likelihood of the patient achieving the goals of the procedure, and any potential problems that might occur during the procedure or recuperation. Informed consent has been obtained.  Findings:  - Normal anterior and posterior urethra with high riding bladder neck - Dense proteinaceous material in bladder, intermixed with purulent debris  - Unable to fully clear bladder with irrigation through cystosope, resectoscope and hematuria catheter  - Attempts to TUR the mucus/ organized pus where unsuccessful given lack of appropriate equiptment   Description of procedure:   The patient was taken to the operating room and general anesthesia was induced.  The patient was placed in the dorsal lithotomy position, prepped and draped in the usual sterile fashion, and preoperative antibiotics were administered. A preoperative time-out was performed.    We began with a 29 French rigid cystoscope with copious lubrication and irrigation.  The anterior and posterior urethra were normal however the bladder neck was high running.  As we entered the bladder we were immediately met with an organized mass of proteinaceous and purulent material.  We attempted to irrigate through the cystoscope however this was  unsuccessful.  We were unable to draw back given how dense and organized the material was.    At this point we removed the cystoscope and inserted a 22 French hematuria catheter.  We resumed irrigating through the catheter and began delivering purulent and mucousy material.  We then switched back to the cystoscope and evaluated the bladder.  We cleared a small area  near the trigone however there was still a large portion of organized dense material.  We again irrigated through the scope and intermittently were able to free up more mucus and pus however there was a large portion remaining.    Given how organized the material was we realized that this would require transurethral resection.  However, unfortunately, the appropriate equipment was not available to [place].  At this point we placed 22 [place] hematuria catheter with the help of a sensor wire and irrigated the bladder, hoping to free up more mucus and pus however this was largely unsuccessful.  The decision was made to awaken the patient from anesthesia and transfer to [facility] so that we could perform a transurethral resection of the dense material and clear him appropriately.  Patient was awoken from anesthesia and recovered in the PACU without difficulty  Plan:  - Transfer to [facility] for cysto, evacuation and irrigation, possible cystotomy, possible SPT

## 2017-10-13 NOTE — H&P (Signed)
History and Physical   [facility] [place] Admission History and Physical [employer], D.O.    Patient Name: Logan Martin MR#: [id number] Date of Birth: 05/27/1956 Date of Admission: 10/13/2017  Referring MD/NP/PA: Dr. Lovena Neighbours - Urology Primary Care Physician: [facility]  Chief Complaint:  Chief Complaint  Patient presents with  . Urinary Retention   Patient was seen and examined in the PACU following cystoscopy.   HPI: Logan Martin is a 62 y.o. male with a known history of ESRD on home-HD T-W-Sa, bladder augmentation surgery, HTN, hep C,  who presented to the emergency department for evaluation of urinary retention/ bladder distention.  Patient is anuric but has a history of pyelocystitis in the past,about every 2-3years.  He was found to have pyeloocystitis which required cystoscopy, cystogram and bladder evacuation with transurethral resection of mucus plug.  Catheter remains in place following the procedure.  Urology requested medical admission for observation overnight.   Patient denies fevers/chills, weakness, dizziness, chest pain, shortness of breath, N/V/C/D, abdominal pain, changes in mental status.    Review of Systems:  CONSTITUTIONAL: No fever/chills, fatigue, weakness, weight gain/loss, headache. EYES: No blurry or double vision. ENT: No tinnitus, postnasal drip, redness or soreness of the oropharynx. RESPIRATORY: No cough, dyspnea, wheeze.  No hemoptysis.  CARDIOVASCULAR: No chest pain, palpitations, syncope, orthopnea. No lower extremity edema.  GASTROINTESTINAL: No nausea, vomiting, abdominal pain, diarrhea, constipation.  No hematemesis, melena or hematochezia. GENITOURINARY: Positive urinary retention, frequency, hematuria. ENDOCRINE: No polyuria or nocturia. No heat or cold intolerance. HEMATOLOGY: No anemia, bruising, bleeding. INTEGUMENTARY: No rashes, ulcers, lesions. MUSCULOSKELETAL: No arthritis, gout,  dyspnea. NEUROLOGIC: No numbness, tingling, ataxia, seizure-type activity, weakness. PSYCHIATRIC: No anxiety, depression, insomnia.   Past Medical History:  Diagnosis Date  . Alcohol abuse    quit date in 1995  . Anemia   . Chronic low back pain   . Cocaine abuse in remission ([place])    Quit date in 1995  . 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 Started HD in [place], [place].  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  On NxStage home hemo since 2010. Using RUA AVF.    [name] ESRD on dialysis ([place])    "TTS; [place]" (06/14/2016)  . Hepatitis C   . History of blood transfusion 2007 X 1  . History of kidney stones   . Hypertension   . Peripheral vascular disease ([place])   . Pulmonary embolism [facility]) Feb 2012   Treated with coumadin x 1 year  . Renal insufficiency     Past Surgical History:  Procedure Laterality Date  . ABDOMINAL HERNIA REPAIR  ~ 2000 X 2; 2001 X1  . AV FISTULA PLACEMENT Right 2007   upper arm  . AV FISTULA REPAIR Right ~ 03/2016 & 04/2016  . BLADDER AUGMENTATION  1975  . CORONARY ANGIOPLASTY WITH STENT PLACEMENT  1999  . EXTRACORPOREAL SHOCK WAVE LITHOTRIPSY    . HERNIA REPAIR    . INGUINAL HERNIA REPAIR Right 1975  . PERIPHERAL VASCULAR CATHETERIZATION N/A 06/22/2016   Procedure: Left Arm Venography;  Surgeon: Elam Dutch, MD;  Location: [place] [facility];  Service: Cardiovascular;  Laterality: N/A;  . PERIPHERAL VASCULAR CATHETERIZATION N/A 06/22/2016   Procedure: A/V Fistulagram - Right Arm;  Surgeon: Elam Dutch, MD;  Location: [place] [facility];  Service: Cardiovascular;  Laterality: N/A;  . PERIPHERAL VASCULAR CATHETERIZATION Right 06/22/2016  Procedure: Peripheral Vascular Balloon Angioplasty;  Surgeon: Elam Dutch, MD;  Location: [place] [facility];  Service: Cardiovascular;  Laterality: Right;  arm fistula     reports that he quit smoking about 24 years ago. His smoking use included cigarettes.  He has a 5.00 pack-year smoking history. He has never used smokeless tobacco. He reports that he does not drink alcohol or use drugs.  Allergies  Allergen Reactions  . Heparin Rash    Pork products   . Pork-Derived Products Rash  . Sulfa Antibiotics Rash    Family History  Problem Relation Age of Onset  . Cancer Mother   . Diabetes Mother   . Hypertension Mother   . Deep vein thrombosis Sister   . Deep vein thrombosis Daughter     Prior to Admission medications   Medication Sig Start Date End Date Taking? Authorizing Provider  calcium acetate (PHOSLO) 667 MG capsule Take 1,334 mg by mouth 3 (three) times daily with meals.    Yes [provider]  hydrALAZINE (APRESOLINE) 50 MG tablet Take 50 mg by mouth 3 (three) times daily. 05/11/16  Yes [provider]  labetalol (NORMODYNE) 200 MG tablet Take 200 mg by mouth 2 (two) times daily. 05/11/16  Yes [provider]  oxycodone (ROXICODONE) 30 MG immediate release tablet Take 30 mg by mouth 3 (three) times daily.   Yes [provider]  Oxycodone HCl 10 MG TABS Take 10 mg by mouth 3 (three) times daily as needed (for pain).  05/22/16  Yes [provider]  cephALEXin (KEFLEX) 500 MG capsule Take 1 capsule (500 mg total) by mouth 2 (two) times daily. Patient not taking: Reported on 10/13/2017 01/16/17   Margarita Mail, PA-C  ciprofloxacin (CIPRO) 500 MG tablet Take 500 mg by mouth every 24 hours. On the days that you dialyze, make sure to take the medication after your dialysis. Patient not taking: Reported on 10/13/2017 05/11/17   Margarita Mail, PA-C  Glecaprevir-Pibrentasvir (MAVYRET) 100-40 MG TABS Take 3 tablets by mouth daily. Patient not taking: Reported on 01/15/2017 08/15/16   Thayer Headings, MD  HYDROcodone-acetaminophen (NORCO/VICODIN) 5-325 MG tablet Take one tablet every 8-12 hours for severe pain. Patient not taking: Reported on 10/13/2017 05/11/17   Margarita Mail, PA-C  multivitamin  (RENA-VIT) TABS tablet Take 1 tablet by mouth at bedtime. Patient not taking: Reported on 10/13/2017 06/09/13   Murlean Iba, MD  neomycin-polymyxin-hydrocortisone (CORTISPORIN) 3.5-10000-1 OTIC suspension Place 4 drops into the left ear 3 (three) times daily. Patient not taking: Reported on 10/13/2017 05/11/17   Margarita Mail, PA-C    Physical Exam: Vitals:   10/13/17 2050 10/13/17 2100 10/13/17 2302 10/13/17 2315  BP: (!) 213/118 (!) 188/125 (!) 171/83 112/84  Pulse:  77 89 90  Resp:  15 17 16   Temp:   97.6 F (36.4 C)   TempSrc:      SpO2:  99% 100% 100%  Weight:      Height:        GENERAL: 62 y.o.-year-old male patient, chronically ill-appearing, sidelying in the bed in no acute distress.  Pleasant and cooperative.  Hard of hearing HEENT: Head atraumatic, normocephalic. Pupils equal. Mucus membranes moist. NECK: Supple. No JVD. CHEST: Normal breath sounds bilaterally. No wheezing, rales, rhonchi or crackles. No use of accessory muscles of respiration.  No reproducible chest wall tenderness.  CARDIOVASCULAR: S1, S2 normal. No murmurs, rubs, or gallops. Cap refill <2 seconds. Pulses intact distally.  ABDOMEN: Soft, nondistended,  nontender. No rebound, guarding, rigidity. Normoactive bowel sounds present in all four quadrants.  EXTREMITIES: RUE fistula. No pedal edema, cyanosis, or clubbing. No calf tenderness or Homan's sign.  NEUROLOGIC: The patient is alert and oriented x 3. Cranial nerves II through XII are grossly intact with no focal sensorimotor deficit. PSYCHIATRIC:  Normal affect, mood, thought content. SKIN: Warm, dry, and intact without obvious rash, lesion, or ulcer.    Labs on Admission:  CBC: Recent Labs  Lab 10/13/17 1621  WBC 3.8*  HGB 10.5*  HCT 33.2*  MCV 100.6*  PLT 161*   Basic Metabolic Panel: Recent Labs  Lab 10/13/17 1621  NA 140  K 4.6  CL 101  CO2 29  GLUCOSE 84  BUN 39*  CREATININE 8.55*  CALCIUM 8.8*   GFR: Estimated  Creatinine Clearance: 7.3 mL/min (A) (by C-G formula based on SCr of 8.55 mg/dL (H)). Liver Function Tests: No results for input(s): AST, ALT, ALKPHOS, BILITOT, PROT, ALBUMIN in the last 168 hours. No results for input(s): LIPASE, AMYLASE in the last 168 hours. No results for input(s): AMMONIA in the last 168 hours. Coagulation Profile: No results for input(s): INR, PROTIME in the last 168 hours. Cardiac Enzymes: No results for input(s): CKTOTAL, CKMB, CKMBINDEX, TROPONINI in the last 168 hours. BNP (last 3 results) No results for input(s): PROBNP in the last 8760 hours. HbA1C: No results for input(s): HGBA1C in the last 72 hours. CBG: No results for input(s): GLUCAP in the last 168 hours. Lipid Profile: No results for input(s): CHOL, HDL, LDLCALC, TRIG, CHOLHDL, LDLDIRECT in the last 72 hours. Thyroid Function Tests: No results for input(s): TSH, T4TOTAL, FREET4, T3FREE, THYROIDAB in the last 72 hours. Anemia Panel: No results for input(s): VITAMINB12, FOLATE, FERRITIN, TIBC, IRON, RETICCTPCT in the last 72 hours. Urine analysis:    Component Value Date/Time   COLORURINE BROWN (A) 01/15/2017 1747   APPEARANCEUR TURBID (A) 01/15/2017 1747   LABSPEC 1.015 01/15/2017 1747   PHURINE 8.5 (H) 01/15/2017 1747   GLUCOSEU 250 (A) 01/15/2017 1747   HGBUR LARGE (A) 01/15/2017 1747   BILIRUBINUR LARGE (A) 01/15/2017 1747   KETONESUR 15 (A) 01/15/2017 1747   PROTEINUR >300 (A) 01/15/2017 1747   NITRITE POSITIVE (A) 01/15/2017 1747   LEUKOCYTESUR LARGE (A) 01/15/2017 1747   Sepsis Labs: @LABRCNTIP (procalcitonin:4,lacticidven:4) )No results found for this or any previous visit (from the past 240 hour(s)).   Radiological Exams on Admission: Dg C-arm 1-60 Min-no Report  Result Date: 10/13/2017 Fluoroscopy was utilized by the requesting physician.  No radiographic interpretation.    Assessment/Plan  This is a 62 y.o. male with a history of ESRD on HD, bladder augmentation surgery, HTN,  hep C, now being admitted with:  #. Pyelocystitis s/p cystoscopy with resection of mucus plug - Admit to observation - Catheter in place - CBI overnight, orders per urology - Pain control  #. H/O ESRD - Continue Phoslo - Resume HD Tuesday  #. H/O HTN - Continue hydralazine, labetalol  #. History of chronic pain - Continue oxycodone  Admission status: Observation IV Fluids: NS Diet/Nutrition: Advance diet as tolerated Consults called: Urology  DVT Px: Heparin, SCDs and early ambulation. Code Status: Full Code  Disposition Plan: To home in 1-2 days  All the records are reviewed and case discussed with ED provider. Management plans discussed with the patient and/or family who express understanding and agree with plan of care.  Roderic Lammert D.O. on 10/13/2017 at 11:24 PM CC: Primary care physician; [place], [place]  [facility]   10/13/2017, 11:24 PM

## 2017-10-14 ENCOUNTER — Other Ambulatory Visit: Payer: Self-pay

## 2017-10-14 ENCOUNTER — Encounter (HOSPITAL_COMMUNITY): Payer: Self-pay | Admitting: Urology

## 2017-10-14 DIAGNOSIS — N2581 Secondary hyperparathyroidism of renal origin: Secondary | ICD-10-CM

## 2017-10-14 DIAGNOSIS — N12 Tubulo-interstitial nephritis, not specified as acute or chronic: Principal | ICD-10-CM

## 2017-10-14 DIAGNOSIS — Z992 Dependence on renal dialysis: Secondary | ICD-10-CM

## 2017-10-14 DIAGNOSIS — N186 End stage renal disease: Secondary | ICD-10-CM

## 2017-10-14 DIAGNOSIS — I10 Essential (primary) hypertension: Secondary | ICD-10-CM

## 2017-10-14 DIAGNOSIS — I119 Hypertensive heart disease without heart failure: Secondary | ICD-10-CM

## 2017-10-14 LAB — CBC
HEMATOCRIT: 34.3 % — AB (ref 39.0–52.0)
Hemoglobin: 11.2 g/dL — ABNORMAL LOW (ref 13.0–17.0)
MCH: 32.2 pg (ref 26.0–34.0)
MCHC: 32.7 g/dL (ref 30.0–36.0)
MCV: 98.6 fL (ref 78.0–100.0)
Platelets: 175 10*3/uL (ref 150–400)
RBC: 3.48 MIL/uL — AB (ref 4.22–5.81)
RDW: 16.6 % — ABNORMAL HIGH (ref 11.5–15.5)
WBC: 3 10*3/uL — AB (ref 4.0–10.5)

## 2017-10-14 LAB — BASIC METABOLIC PANEL
ANION GAP: 14 (ref 5–15)
BUN: 49 mg/dL — ABNORMAL HIGH (ref 6–20)
CHLORIDE: 105 mmol/L (ref 101–111)
CO2: 23 mmol/L (ref 22–32)
Calcium: 8.9 mg/dL (ref 8.9–10.3)
Creatinine, Ser: 8.49 mg/dL — ABNORMAL HIGH (ref 0.61–1.24)
GFR calc Af Amer: 7 mL/min — ABNORMAL LOW (ref >60)
GFR calc non Af Amer: 6 mL/min — ABNORMAL LOW (ref >60)
Glucose, Bld: 102 mg/dL — ABNORMAL HIGH (ref 65–99)
POTASSIUM: 4.8 mmol/L (ref 3.5–5.1)
SODIUM: 142 mmol/L (ref 135–145)

## 2017-10-14 LAB — HIV ANTIBODY (ROUTINE TESTING W REFLEX): HIV Screen 4th Generation wRfx: NONREACTIVE

## 2017-10-14 MED ORDER — ONDANSETRON HCL 4 MG/2ML IJ SOLN
4.0000 mg | Freq: Four times a day (QID) | INTRAMUSCULAR | Status: DC | PRN
  Administered 2017-10-14 – 2017-10-15 (×4): 4 mg via INTRAVENOUS
  Filled 2017-10-14 (×4): qty 2

## 2017-10-14 MED ORDER — OXYCODONE HCL 5 MG PO TABS
30.0000 mg | ORAL_TABLET | Freq: Three times a day (TID) | ORAL | Status: DC
  Administered 2017-10-14 – 2017-10-15 (×3): 30 mg via ORAL
  Filled 2017-10-14 (×4): qty 6

## 2017-10-14 MED ORDER — CALCIUM ACETATE (PHOS BINDER) 667 MG PO CAPS
1334.0000 mg | ORAL_CAPSULE | Freq: Three times a day (TID) | ORAL | Status: DC
  Administered 2017-10-14 – 2017-10-15 (×3): 1334 mg via ORAL
  Filled 2017-10-14 (×5): qty 2

## 2017-10-14 MED ORDER — MORPHINE SULFATE (PF) 4 MG/ML IV SOLN
2.0000 mg | INTRAVENOUS | Status: DC | PRN
  Administered 2017-10-14: 4 mg via INTRAVENOUS
  Filled 2017-10-14: qty 1

## 2017-10-14 MED ORDER — LABETALOL HCL 200 MG PO TABS
200.0000 mg | ORAL_TABLET | Freq: Two times a day (BID) | ORAL | Status: DC
  Administered 2017-10-14 (×3): 200 mg via ORAL
  Filled 2017-10-14 (×3): qty 1

## 2017-10-14 MED ORDER — SODIUM CHLORIDE 0.9 % IV SOLN
INTRAVENOUS | Status: DC
  Administered 2017-10-14: 01:00:00 via INTRAVENOUS

## 2017-10-14 MED ORDER — ACETAMINOPHEN 650 MG RE SUPP: 650.0000 mg | Freq: Four times a day (QID) | RECTAL | Status: DC | PRN

## 2017-10-14 MED ORDER — OXYCODONE HCL 5 MG PO TABS
10.0000 mg | ORAL_TABLET | Freq: Three times a day (TID) | ORAL | Status: DC | PRN
Start: 2017-10-14 — End: 2017-10-15

## 2017-10-14 MED ORDER — HYDRALAZINE HCL 50 MG PO TABS
50.0000 mg | ORAL_TABLET | Freq: Three times a day (TID) | ORAL | Status: DC
  Administered 2017-10-14 (×3): 50 mg via ORAL
  Filled 2017-10-14 (×3): qty 1

## 2017-10-14 MED ORDER — SODIUM CHLORIDE 0.9 % IR SOLN
3000.0000 mL | Status: DC
  Administered 2017-10-14 (×10): 3000 mL

## 2017-10-14 MED ORDER — IPRATROPIUM BROMIDE 0.02 % IN SOLN: 0.5000 mg | Freq: Four times a day (QID) | RESPIRATORY_TRACT | Status: DC | PRN

## 2017-10-14 MED ORDER — ACETAMINOPHEN 325 MG PO TABS: 650.0000 mg | ORAL_TABLET | Freq: Four times a day (QID) | ORAL | Status: DC | PRN

## 2017-10-14 MED ORDER — HEPARIN SODIUM (PORCINE) 5000 UNIT/ML IJ SOLN: 5000.0000 [IU] | Freq: Three times a day (TID) | INTRAMUSCULAR | Status: DC

## 2017-10-14 MED ORDER — ALBUTEROL SULFATE (2.5 MG/3ML) 0.083% IN NEBU: 2.5000 mg | INHALATION_SOLUTION | Freq: Four times a day (QID) | RESPIRATORY_TRACT | Status: DC | PRN

## 2017-10-14 MED ORDER — ONDANSETRON HCL 4 MG PO TABS: 4.0000 mg | ORAL_TABLET | Freq: Four times a day (QID) | ORAL | Status: DC | PRN

## 2017-10-14 NOTE — Evaluation (Signed)
Physical Therapy Evaluation Patient Details Name: Logan Martin MRN: [id number] DOB: [birth year] Today's Date: 10/14/2017   History of Present Illness  ESRD on home dialysis 4 x wk (Tues/Wed/Thurs/Sat), Hep C, HTN, h/o  bladder augmentation surgery and was being treated for pyocystitis. He underwent a cystography on 5/19 where a mucous plug was found and underwent a TUR and was admitted for CBI  Clinical Impression  Pt admitted with above diagnosis. Pt currently with functional limitations due to the deficits listed below (see PT Problem List).  Pt will benefit from skilled PT to increase their independence and safety with mobility to allow discharge to the venue listed below.   Pt only agreeable for OOB to recliner however able to perform transfer well.  Pt would benefit from ambulating with nursing staff (when he feels able).  No f/u recommendations at this time.     Follow Up Recommendations No PT follow up    Equipment Recommendations  None recommended by PT    Recommendations for Other Services       Precautions / Restrictions Precautions Precautions: Fall      Mobility  Bed Mobility Overal bed mobility: Needs Assistance Bed Mobility: Supine to Sit     Supine to sit: Supervision;HOB elevated     General bed mobility comments: pt performed longsitting and had emesis episode, rail dropped for pt to sit EOB with feet down per his request  Transfers Overall transfer level: Needs assistance Equipment used: None Transfers: Sit to/from [employer] Transfers Sit to Stand: Supervision Stand pivot transfers: Supervision       General transfer comment: pt only agreeable to transfer OOB to recliner (for linen change), declined ambulating  Ambulation/Gait                Stairs            Wheelchair Mobility    Modified Rankin (Stroke Patients Only)       Balance Overall balance assessment: No apparent balance deficits (not formally assessed)                                            Pertinent Vitals/Pain Pain Assessment: Faces Faces Pain Scale: Hurts little more Pain Intervention(s): Repositioned;RN gave pain meds during session    Home Living Family/patient expects to be discharged to:: Private residence Living Arrangements: Alone               Additional Comments: not obtained, pt talking with RN and then on phone after transfer    Prior Function Level of Independence: Independent         Comments: home dialysis     Hand Dominance        Extremity/Trunk Assessment        Lower Extremity Assessment Lower Extremity Assessment: Generalized weakness       Communication   Communication: No difficulties  Cognition Arousal/Alertness: Awake/alert Behavior During Therapy: Flat affect Overall Cognitive Status: Within Functional Limits for tasks assessed                                        General Comments      Exercises     Assessment/Plan    PT Assessment Patient needs continued PT services  PT Problem List Decreased mobility;Decreased  strength;Decreased activity tolerance       PT Treatment Interventions DME instruction;Therapeutic activities;Gait training;Therapeutic exercise;Patient/family education;Functional mobility training    PT Goals (Current goals can be found in the Care Plan section)  Acute Rehab PT Goals PT Goal Formulation: With patient Time For Goal Achievement: 10/21/17 Potential to Achieve Goals: Good    Frequency Min 2X/week   Barriers to discharge        Co-evaluation               AM-PAC PT "6 Clicks" Daily Activity  Outcome Measure Difficulty turning over in bed (including adjusting bedclothes, sheets and blankets)?: Unable Difficulty moving from lying on back to sitting on the side of the bed? : Unable Difficulty sitting down on and standing up from a chair with arms (e.g., wheelchair, bedside commode, etc,.)?:  Unable Help needed moving to and from a bed to chair (including a wheelchair)?: A Little Help needed walking in hospital room?: A Little Help needed climbing 3-5 steps with a railing? : A Lot 6 Click Score: 11    End of Session   Activity Tolerance: Patient tolerated treatment well Patient left: in chair;with call bell/phone within reach;with family/visitor present Nurse Communication: Mobility status PT Visit Diagnosis: Difficulty in walking, not elsewhere classified (R26.2)    Time: 9574-7340 PT Time Calculation (min) (ACUTE ONLY): 13 min   Charges:   PT Evaluation $PT Eval Low Complexity: 1 Low     PT G CodesCarmelia Bake, PT, DPT 10/14/2017 Pager: [phone]  York Ram E 10/14/2017, 3:13 PM

## 2017-10-14 NOTE — Progress Notes (Addendum)
PROGRESS NOTE    Logan Martin   ZOX:[id number]  DOB: Aug 02, 1955  DOA: 10/13/2017 PCP: [facility]   Brief Narrative:  Logan Martin with ESRD on home dialysis 4 x wk (Tues/Wed/Thurs/Sat), Hep C, HTN, h/o  bladder augmentation surgery and was being treated for pyocystitis. He underwent a cystography on 5/19 where a mucous plug was found and underwent a TUR  And was admitted for CBI.   Subjective: No complaints ROS: no complaints of nausea, vomiting, constipation diarrhea, cough, dyspnea or dysuria. No other complaints.   Assessment & Plan:   Principal Problem:   Pyelocystitis - management per urology  Active Problems:   ESRD on dialysis -  Based on  exam and labs, he has no need for dialysis today - cont Phoslo - urology tells me that he will likely be discharge early tomorrow and therefore will be able to be dialyzed at home tomorrow- appreciate care management assisting to arrange    HTN (hypertension) - cont Hydralazine and Labetalol  Chronic pain - Uses Roxicodone 30 mg TID and 10mg  TID PRN for breakthrough  DVT prophylaxis: SCDs Code Status: Full code Family Communication:  Disposition Plan: home tomorrow if stable- (per Urology) Consultants:   urology Procedures:   CBI after urostomy Antimicrobials:  Anti-infectives (From admission, onward)   Start     Dose/Rate Route Frequency Ordered Stop   10/13/17 1730  ceFAZolin (ANCEF) IVPB 2g/100 mL premix     2 g 200 mL/hr over 30 Minutes Intravenous To Surgery 10/13/17 1726 10/13/17 1824       Objective: Vitals:   10/14/17 0000 10/14/17 0037 10/14/17 0535 10/14/17 1040  BP: (!) 178/66 (!) 164/96 (!) 146/106 (!) 158/85  Pulse: 95 (!) 110 96 (!) 102  Resp: 17 16 (!) 22   Temp: 98.3 F (36.8 C) 98.2 F (36.8 C) 98 F (36.7 C)   TempSrc:   Oral   SpO2: 99% 98% 99%   Weight:      Height:        Intake/Output Summary (Last 24 hours) at 10/14/2017 1329 Last data filed at 10/14/2017  1217 Gross per 24 hour  Intake 17595 ml  Output 25425 ml  Net -7830 ml   Filed Weights   10/13/17 1332  Weight: 56.7 kg (125 lb)    Examination: General exam: Appears comfortable  HEENT: PERRLA, oral mucosa moist, no sclera icterus or thrush Respiratory system: Clear to auscultation. Respiratory effort normal. Cardiovascular system: S1 & S2 heard, RRR.   Gastrointestinal system: Abdomen soft, non-tender, nondistended. Normal bowel sound. No organomegaly Central nervous system: Alert and oriented. No focal neurological deficits. GU: CBI in place Extremities: No cyanosis, clubbing or edema Skin: No rashes or ulcers Psychiatry:  Mood & affect appropriate.     Data Reviewed: I have personally reviewed following labs and imaging studies  CBC: Recent Labs  Lab 10/13/17 1621 10/14/17 0457  WBC 3.8* 3.0*  HGB 10.5* 11.2*  HCT 33.2* 34.3*  MCV 100.6* 98.6  PLT 145* 811   Basic Metabolic Panel: Recent Labs  Lab 10/13/17 1621 10/14/17 0457  NA 140 142  K 4.6 4.8  CL 101 105  CO2 29 23  GLUCOSE 84 102*  BUN 39* 49*  CREATININE 8.55* 8.49*  CALCIUM 8.8* 8.9   GFR: Estimated Creatinine Clearance: 7.3 mL/min (A) (by C-G formula based on SCr of 8.49 mg/dL (H)). Liver Function Tests: No results for input(s): AST, ALT, ALKPHOS, BILITOT, PROT, ALBUMIN in the  last 168 hours. No results for input(s): LIPASE, AMYLASE in the last 168 hours. No results for input(s): AMMONIA in the last 168 hours. Coagulation Profile: No results for input(s): INR, PROTIME in the last 168 hours. Cardiac Enzymes: No results for input(s): CKTOTAL, CKMB, CKMBINDEX, TROPONINI in the last 168 hours. BNP (last 3 results) No results for input(s): PROBNP in the last 8760 hours. HbA1C: No results for input(s): HGBA1C in the last 72 hours. CBG: No results for input(s): GLUCAP in the last 168 hours. Lipid Profile: No results for input(s): CHOL, HDL, LDLCALC, TRIG, CHOLHDL, LDLDIRECT in the last 72  hours. Thyroid Function Tests: No results for input(s): TSH, T4TOTAL, FREET4, T3FREE, THYROIDAB in the last 72 hours. Anemia Panel: No results for input(s): VITAMINB12, FOLATE, FERRITIN, TIBC, IRON, RETICCTPCT in the last 72 hours. Urine analysis:    Component Value Date/Time   COLORURINE BROWN (A) 01/15/2017 1747   APPEARANCEUR TURBID (A) 01/15/2017 1747   LABSPEC 1.015 01/15/2017 1747   PHURINE 8.5 (H) 01/15/2017 1747   GLUCOSEU 250 (A) 01/15/2017 1747   HGBUR LARGE (A) 01/15/2017 1747   BILIRUBINUR LARGE (A) 01/15/2017 1747   KETONESUR 15 (A) 01/15/2017 1747   PROTEINUR >300 (A) 01/15/2017 1747   NITRITE POSITIVE (A) 01/15/2017 1747   LEUKOCYTESUR LARGE (A) 01/15/2017 1747   Sepsis Labs: @LABRCNTIP (procalcitonin:4,lacticidven:4) )No results found for this or any previous visit (from the past 240 hour(s)).       Radiology Studies: Dg C-arm 1-60 Min-no Report  Result Date: 10/13/2017 Fluoroscopy was utilized by the requesting physician.  No radiographic interpretation.      Scheduled Meds: . calcium acetate  1,334 mg Oral TID WC  . hydrALAZINE  50 mg Oral TID  . labetalol  200 mg Oral BID  . oxycodone  30 mg Oral Q8H   Continuous Infusions: . sodium chloride 75 mL/hr at 10/14/17 0053  . sodium chloride irrigation       LOS: 1 day    Time spent in minutes: 35- > 50 % of time speaking with urologist, nephrologist and case management about discharge plan and dialysis.     Debbe Odea, MD [facility] Pager: [email or web address] Password [email or web address] 10/14/2017, 1:29 PM

## 2017-10-14 NOTE — Progress Notes (Signed)
PT Cancellation Note  Patient Details Name: Logan Martin MRN: [id number] DOB: [birth year]   Cancelled Treatment:    Reason Eval/Treat Not Completed: Fatigue/lethargy limiting ability to participate Pt reports being tired this morning and requested PT to check back later.  Will check back as schedule permits.   [name],KATHrine E 10/14/2017, 9:57 AM

## 2017-10-14 NOTE — Anesthesia Postprocedure Evaluation (Signed)
Anesthesia Post Note  Patient: Logan Martin  Procedure(s) Performed: CYSTOSCOPY WITH IRRIGATION OF BLADDER PLACEMENT OF FOLEY (N/A )     Patient location during evaluation: PACU Anesthesia Type: General Level of consciousness: awake and alert Pain management: pain level controlled Vital Signs Assessment: post-procedure vital signs reviewed and stable Respiratory status: spontaneous breathing, nonlabored ventilation, respiratory function stable and patient connected to nasal cannula oxygen Cardiovascular status: blood pressure returned to baseline and stable Postop Assessment: no apparent nausea or vomiting Anesthetic complications: no    Last Vitals:  Vitals:   10/13/17 2345 10/14/17 0000  BP: (!) 162/58 (!) 178/66  Pulse: 90 95  Resp: 16 17  Temp:  36.8 C  SpO2: 100% 99%    Last Pain:  Vitals:   10/14/17 0000  TempSrc:   PainSc: Asleep                 Jenavee Laguardia,W. EDMOND

## 2017-10-14 NOTE — Progress Notes (Signed)
1 Day Post-Op Subjective: No acute events overnight.  He is resting comfortably this morning in bed, but upon awakening, he states that he is having some suprapubic pain and fullness.  His catheter required multiple flushings overnight, but has been draining relatively smoothly.    Objective: Vital signs in last 24 hours: Temp:  [97.2 F (36.2 C)-99.4 F (37.4 C)] 98 F (36.7 C) (05/20 0535) Pulse Rate:  [77-110] 96 (05/20 0535) Resp:  [0-25] 22 (05/20 0535) BP: (112-241)/(58-125) 146/106 (05/20 0535) SpO2:  [96 %-100 %] 99 % (05/20 0535) Weight:  [56.7 kg (125 lb)] 56.7 kg (125 lb) (05/19 1332)  Intake/Output from previous day: 05/19 0701 - 05/20 0700 In: 24462 [I.V.:1295] Out: 13025 [Urine:12725; Blood:300]  Intake/Output this shift: No intake/output data recorded.  Physical Exam:  General: Alert and oriented CV: RRR, palpable distal pulses Lungs: CTAB, equal chest rise Abdomen: Soft, NTND, no rebound or guarding GU: Foley catheter in place and draining blood-tinged urine. Ext: NT, No erythema  Lab Results: Recent Labs    10/13/17 1621 10/14/17 0457  HGB 10.5* 11.2*  HCT 33.2* 34.3*   BMET Recent Labs    10/13/17 1621 10/14/17 0457  NA 140 142  K 4.6 4.8  CL 101 105  CO2 29 23  GLUCOSE 84 102*  BUN 39* 49*  CREATININE 8.55* 8.49*  CALCIUM 8.8* 8.9     Studies/Results: Dg C-arm 1-60 Min-no Report  Result Date: 10/13/2017 Fluoroscopy was utilized by the requesting physician.  No radiographic interpretation.    Assessment/Plan:  62 y.o. male with ESRD and bladder augmentation w/ pyocystis   -CBI and hand irrigation of Foley catheter as needed.   -We will arrange outpatient follow-up to discuss possible cystectomy, as the patient states that he will not a candidate for renal transplant.   LOS: 1 day   Ellison Hughs, MD [facility] Pager: [phone]  10/14/2017, 7:43 AM

## 2017-10-14 NOTE — Discharge Summary (Signed)
Physician Discharge Summary  Logan Martin JKK:[id number] DOB: January 11, 1956 DOA: 10/13/2017  PCP: [facility]  Admit date: 10/13/2017 Discharge date: 10/15/2017  Admitted From: home  Disposition:  home   Discharge Condition:  stable   CODE STATUS:  Full code   Consultations:  Urology     Discharge Diagnoses:  Principal Problem:   Pyelocystitis Active Problems:   ESRD on dialysis ([place])   HTN (hypertension)      Brief Summary: Logan Martin with ESRD on home dialysis 4 x wk (Tues/Wed/Thurs/Sat), Hep C, HTN, h/o  bladder augmentation surgery and was being treated for pyocystitis. He underwent a cystography on 5/19 where a mucous plug was found and underwent a TUR  And was admitted to [facility] for CBI.    Hospital Course:  Principal Problem:   Pyelocystitis - See above - management per urology- CBI complete and OK to d/c per Urology  Active Problems:   ESRD on dialysis - missed dialysis on Sunday and Monday due to being in the hospital - I spoke with Nephrology today in regards to whether he should be transferred to [facility] to have dialysis done  -  Based on  exam and labs, he had no need for urgent dialysis and is has been decided to allow him to stay at [facility] to complete his CBI here - cont Phoslo    HTN (hypertension) - cont Hydralazine and Labetalol  Chronic pain - Uses Roxicodone 30 mg TID and 10mg  TID PRN for breakthrough   Discharge Exam: Vitals:   10/14/17 2127 10/15/17 0601  BP: 99/86 (!) 146/80  Pulse: (!) 102 (!) 105  Resp: 16 20  Temp: 99.4 F (37.4 C) 98.2 F (36.8 C)  SpO2: 98% 97%   Vitals:   10/14/17 1040 10/14/17 1404 10/14/17 2127 10/15/17 0601  BP: (!) 158/85 (!) 148/77 99/86 (!) 146/80  Pulse: (!) 102 98 (!) 102 (!) 105  Resp:  18 16 20   Temp:  98.2 F (36.8 C) 99.4 F (37.4 C) 98.2 F (36.8 C)  TempSrc:  Oral Oral Oral  SpO2:  99% 98% 97%  Weight:      Height:        General: Pt is alert,  awake, not in acute distress Cardiovascular: RRR, S1/S2 +, no rubs, no gallops Respiratory: CTA bilaterally, no wheezing, no rhonchi Abdominal: Soft, NT, ND, bowel sounds + Extremities: no edema, no cyanosis Fistula in RUE with good thrill and Bruit   Discharge Instructions  Discharge Instructions    Diet general   Complete by:  As directed    Renal, heart healthy diet   Increase activity slowly   Complete by:  As directed      Allergies as of 10/15/2017      Reactions   Heparin Rash   Pork products    Pork-derived Products Rash   Sulfa Antibiotics Rash      Medication List    TAKE these medications   hydrALAZINE 50 MG tablet Commonly known as:  APRESOLINE Take 50 mg by mouth 3 (three) times daily.   labetalol 200 MG tablet Commonly known as:  NORMODYNE Take 200 mg by mouth 2 (two) times daily.   oxycodone 30 MG immediate release tablet Commonly known as:  ROXICODONE Take 30 mg by mouth 3 (three) times daily.   Oxycodone HCl 10 MG Tabs Take 10 mg by mouth 3 (three) times daily as needed (for pain).   PHOSLO 667 MG  capsule Generic drug:  calcium acetate Take 1,334 mg by mouth 3 (three) times daily with meals.      Follow-up Information    Logan Frock, MD In 1 month.   Specialty:  Urology Contact information: [address] [place] [address] [phone]          Allergies  Allergen Reactions  . Heparin Rash    Pork products   . Pork-Derived Products Rash  . Sulfa Antibiotics Rash     Procedures/Studies: CBI  Dg C-arm 1-60 Min-no Report  Result Date: 10/13/2017 Fluoroscopy was utilized by the requesting physician.  No radiographic interpretation.     The results of significant diagnostics from this hospitalization (including imaging, microbiology, ancillary and laboratory) are listed below for reference.     Microbiology: No results found for this or any previous visit (from the past 240 hour(s)).   Labs: BNP (last 3  results) No results for input(s): BNP in the last 8760 hours. Basic Metabolic Panel: Recent Labs  Lab 10/13/17 1621 10/14/17 0457  NA 140 142  K 4.6 4.8  CL 101 105  CO2 29 23  GLUCOSE 84 102*  BUN 39* 49*  CREATININE 8.55* 8.49*  CALCIUM 8.8* 8.9   Liver Function Tests: No results for input(s): AST, ALT, ALKPHOS, BILITOT, PROT, ALBUMIN in the last 168 hours. No results for input(s): LIPASE, AMYLASE in the last 168 hours. No results for input(s): AMMONIA in the last 168 hours. CBC: Recent Labs  Lab 10/13/17 1621 10/14/17 0457  WBC 3.8* 3.0*  HGB 10.5* 11.2*  HCT 33.2* 34.3*  MCV 100.6* 98.6  PLT 145* 175   Cardiac Enzymes: No results for input(s): CKTOTAL, CKMB, CKMBINDEX, TROPONINI in the last 168 hours. BNP: Invalid input(s): POCBNP CBG: No results for input(s): GLUCAP in the last 168 hours. D-Dimer No results for input(s): DDIMER in the last 72 hours. Hgb A1c No results for input(s): HGBA1C in the last 72 hours. Lipid Profile No results for input(s): CHOL, HDL, LDLCALC, TRIG, CHOLHDL, LDLDIRECT in the last 72 hours. Thyroid function studies No results for input(s): TSH, T4TOTAL, T3FREE, THYROIDAB in the last 72 hours.  Invalid input(s): FREET3 Anemia work up No results for input(s): VITAMINB12, FOLATE, FERRITIN, TIBC, IRON, RETICCTPCT in the last 72 hours. Urinalysis    Component Value Date/Time   COLORURINE BROWN (A) 01/15/2017 1747   APPEARANCEUR TURBID (A) 01/15/2017 1747   LABSPEC 1.015 01/15/2017 1747   PHURINE 8.5 (H) 01/15/2017 1747   GLUCOSEU 250 (A) 01/15/2017 1747   HGBUR LARGE (A) 01/15/2017 1747   BILIRUBINUR LARGE (A) 01/15/2017 1747   KETONESUR 15 (A) 01/15/2017 1747   PROTEINUR >300 (A) 01/15/2017 1747   NITRITE POSITIVE (A) 01/15/2017 1747   LEUKOCYTESUR LARGE (A) 01/15/2017 1747   Sepsis Labs Invalid input(s): PROCALCITONIN,  WBC,  LACTICIDVEN Microbiology No results found for this or any previous visit (from the past 240  hour(s)).   Time coordinating discharge in minutes: 55  SIGNED:   Debbe Odea, MD  [facility] 10/15/2017, 2:12 PM Pager   If 7PM-7AM, please contact night-coverage [email or web address] Password [email or web address]

## 2017-10-14 NOTE — Anesthesia Postprocedure Evaluation (Signed)
Anesthesia Post Note  Patient: Logan Martin  Procedure(s) Performed: TRANSURETHRAL RESECTION/CYSTOSCOPY/BLADDER EVACUATION (N/A )     Patient location during evaluation: PACU Anesthesia Type: General Level of consciousness: awake and alert Pain management: pain level controlled Vital Signs Assessment: post-procedure vital signs reviewed and stable Respiratory status: spontaneous breathing, nonlabored ventilation, respiratory function stable and patient connected to nasal cannula oxygen Cardiovascular status: blood pressure returned to baseline and stable Postop Assessment: no apparent nausea or vomiting Anesthetic complications: no    Last Vitals:  Vitals:   10/13/17 2345 10/14/17 0000  BP: (!) 162/58 (!) 178/66  Pulse: 90 95  Resp: 16 17  Temp:  36.8 C  SpO2: 100% 99%    Last Pain:  Vitals:   10/14/17 0000  TempSrc:   PainSc: [place]

## 2017-10-14 NOTE — Progress Notes (Signed)
A call to [facility] was made [phone] to inform them that pt will need home diaylsis after discharge in the am. Spoke with Aracemi, RN who states pt do HD at home and his grandson assist pt wioth that. Spoke with pt's daughter Erline Hau concerning HD for pt tomorrow after discharge. Virl Diamond states that her son will be available to assist pt. Also spoke with pt to explained that he will need HD tomorrow after discharge. Pt stated okay and that his grandson will help him.

## 2017-10-15 NOTE — Progress Notes (Signed)
Patient is being dischaged home. Discharge instructions were given to patient and family

## 2017-10-15 NOTE — Progress Notes (Signed)
Report received from ongoing RN at midnight. Agreed with RN assessment of patient and will cont to monitor.

## 2017-10-15 NOTE — Progress Notes (Signed)
Urology Inpatient Progress Report  Bladder needs draining  Procedure(s): TRANSURETHRAL RESECTION/CYSTOSCOPY/BLADDER EVACUATION  2 Days Post-Op   Intv/Subj: No acute events overnight. Patient is without complaint.  Principal Problem:   Pyelocystitis Active Problems:   ESRD on dialysis ([place])   HTN (hypertension)  Current Facility-Administered Medications  Medication Dose Route Frequency Provider Last Rate Last Dose  . acetaminophen (TYLENOL) tablet 650 mg  650 mg Oral Q6H PRN Hugelmeyer, Alexis, DO       Or  . acetaminophen (TYLENOL) suppository 650 mg  650 mg Rectal Q6H PRN Hugelmeyer, Alexis, DO      . albuterol (PROVENTIL) (2.5 MG/3ML) 0.083% nebulizer solution 2.5 mg  2.5 mg Nebulization Q6H PRN Hugelmeyer, Alexis, DO      . calcium acetate (PHOSLO) capsule 1,334 mg  1,334 mg Oral TID WC Hugelmeyer, Alexis, DO   1,334 mg at 10/14/17 1300  . hydrALAZINE (APRESOLINE) tablet 50 mg  50 mg Oral TID Hugelmeyer, Alexis, DO   50 mg at 10/14/17 2139  . ipratropium (ATROVENT) nebulizer solution 0.5 mg  0.5 mg Nebulization Q6H PRN Hugelmeyer, Alexis, DO      . labetalol (NORMODYNE) tablet 200 mg  200 mg Oral BID Hugelmeyer, Alexis, DO   200 mg at 10/14/17 2139  . morphine 4 MG/ML injection 2-4 mg  2-4 mg Intravenous Q4H PRN Bodenheimer, Charles A, NP   4 mg at 10/14/17 0123  . ondansetron (ZOFRAN) tablet 4 mg  4 mg Oral Q6H PRN Hugelmeyer, Alexis, DO       Or  . ondansetron (ZOFRAN) injection 4 mg  4 mg Intravenous Q6H PRN Hugelmeyer, Alexis, DO   4 mg at 10/15/17 0015  . oxyCODONE (Oxy IR/ROXICODONE) immediate release tablet 10 mg  10 mg Oral TID PRN Hugelmeyer, Alexis, DO      . oxyCODONE (Oxy IR/ROXICODONE) immediate release tablet 30 mg  30 mg Oral Q8H Hugelmeyer, Alexis, DO   30 mg at 10/15/17 1962  . sodium chloride irrigation 0.9 % 3,000 mL  3,000 mL Irrigation Continuous Hugelmeyer, Alexis, DO   3,000 mL at 10/14/17 2142     Objective: Vital: Vitals:   10/14/17 1040 10/14/17  1404 10/14/17 2127 10/15/17 0601  BP: (!) 158/85 (!) 148/77 99/86 (!) 146/80  Pulse: (!) 102 98 (!) 102 (!) 105  Resp:  18 16 20   Temp:  98.2 F (36.8 C) 99.4 F (37.4 C) 98.2 F (36.8 C)  TempSrc:  Oral Oral Oral  SpO2:  99% 98% 97%  Weight:      Height:       I/Os: I/O last 3 completed shifts: In: 23595 [P.O.:600; I.V.:695; Other:22300] Out: 35700 [Urine:35400; Blood:300]  Physical Exam:  General: Patient is in no apparent distress Lungs: Normal respiratory effort, chest expands symmetrically. Foley - blood tinged on slow CBI.   Lab Results: Recent Labs    10/13/17 1621 10/14/17 0457  WBC 3.8* 3.0*  HGB 10.5* 11.2*  HCT 33.2* 34.3*   Recent Labs    10/13/17 1621 10/14/17 0457  NA 140 142  K 4.6 4.8  CL 101 105  CO2 29 23  GLUCOSE 84 102*  BUN 39* 49*  CREATININE 8.55* 8.49*  CALCIUM 8.8* 8.9   No results for input(s): LABPT, INR in the last 72 hours. No results for input(s): LABURIN in the last 72 hours. Results for orders placed or performed during the hospital encounter of 01/15/17  Urine Culture     Status: Abnormal   Collection Time: 01/15/17  5:47 PM  Result Value Ref Range Status   Specimen Description URINE, RANDOM  Final   Special Requests NONE  Final   Culture MULTIPLE SPECIES PRESENT, SUGGEST RECOLLECTION (A)  Final   Report Status 01/17/2017 FINAL  Final    Studies/Results: Dg C-arm 1-60 Min-no Report  Result Date: 10/13/2017 Fluoroscopy was utilized by the requesting physician.  No radiographic interpretation.    Assessment: Procedure(s): TRANSURETHRAL RESECTION/CYSTOSCOPY/BLADDER EVACUATION, 2 Days Post-Op  doing well.  Plan: D/c foley Will schedule f/u with urology - will consider cystectomy in future.    Louis Meckel, MD Urology 10/15/2017, 8:02 AM

## 2017-11-12 ENCOUNTER — Other Ambulatory Visit (HOSPITAL_COMMUNITY): Payer: Self-pay | Admitting: Nephrology

## 2017-11-12 DIAGNOSIS — I871 Compression of vein: Secondary | ICD-10-CM

## 2017-11-12 DIAGNOSIS — I82210 Acute embolism and thrombosis of superior vena cava: Secondary | ICD-10-CM

## 2017-11-13 ENCOUNTER — Ambulatory Visit (HOSPITAL_COMMUNITY)
Admission: RE | Admit: 2017-11-13 | Discharge: 2017-11-13 | Disposition: A | Discharge status: Home / Self Care | Payer: Medicare Other | Source: Ambulatory Visit | Attending: Nephrology | Admitting: Nephrology

## 2017-11-13 DIAGNOSIS — I728 Aneurysm of other specified arteries: Secondary | ICD-10-CM | POA: Insufficient documentation

## 2017-11-13 DIAGNOSIS — Z95818 Presence of other cardiac implants and grafts: Secondary | ICD-10-CM | POA: Diagnosis not present

## 2017-11-13 DIAGNOSIS — I871 Compression of vein: Secondary | ICD-10-CM

## 2017-11-13 DIAGNOSIS — I82210 Acute embolism and thrombosis of superior vena cava: Secondary | ICD-10-CM

## 2017-11-13 MED ORDER — IOPAMIDOL (ISOVUE-370) INJECTION 76%
INTRAVENOUS | Status: AC
  Administered 2017-11-13: 100 mL
  Filled 2017-11-13: qty 100

## 2017-11-21 ENCOUNTER — Inpatient Hospital Stay (HOSPITAL_COMMUNITY)
Admission: EM | Admit: 2017-11-21 | Discharge: 2017-11-22 | DRG: 252 | Discharge status: Against Medical Advice | Payer: Medicare Other | Attending: Internal Medicine | Admitting: Internal Medicine

## 2017-11-21 ENCOUNTER — Encounter (HOSPITAL_COMMUNITY): Payer: Self-pay | Admitting: Emergency Medicine

## 2017-11-21 DIAGNOSIS — Z79891 Long term (current) use of opiate analgesic: Secondary | ICD-10-CM | POA: Diagnosis not present

## 2017-11-21 DIAGNOSIS — T82858A Stenosis of vascular prosthetic devices, implants and grafts, initial encounter: Principal | ICD-10-CM | POA: Diagnosis present

## 2017-11-21 DIAGNOSIS — Z86711 Personal history of pulmonary embolism: Secondary | ICD-10-CM | POA: Diagnosis not present

## 2017-11-21 DIAGNOSIS — Z960 Presence of urogenital implants: Secondary | ICD-10-CM | POA: Diagnosis present

## 2017-11-21 DIAGNOSIS — I120 Hypertensive chronic kidney disease with stage 5 chronic kidney disease or end stage renal disease: Secondary | ICD-10-CM | POA: Diagnosis present

## 2017-11-21 DIAGNOSIS — D631 Anemia in chronic kidney disease: Secondary | ICD-10-CM | POA: Diagnosis present

## 2017-11-21 DIAGNOSIS — I739 Peripheral vascular disease, unspecified: Secondary | ICD-10-CM | POA: Diagnosis present

## 2017-11-21 DIAGNOSIS — Y712 Prosthetic and other implants, materials and accessory cardiovascular devices associated with adverse incidents: Secondary | ICD-10-CM | POA: Diagnosis present

## 2017-11-21 DIAGNOSIS — Z8619 Personal history of other infectious and parasitic diseases: Secondary | ICD-10-CM | POA: Diagnosis not present

## 2017-11-21 DIAGNOSIS — M545 Low back pain: Secondary | ICD-10-CM | POA: Diagnosis present

## 2017-11-21 DIAGNOSIS — I2510 Atherosclerotic heart disease of native coronary artery without angina pectoris: Secondary | ICD-10-CM | POA: Diagnosis present

## 2017-11-21 DIAGNOSIS — Z885 Allergy status to narcotic agent status: Secondary | ICD-10-CM

## 2017-11-21 DIAGNOSIS — Z87891 Personal history of nicotine dependence: Secondary | ICD-10-CM

## 2017-11-21 DIAGNOSIS — D61818 Other pancytopenia: Secondary | ICD-10-CM | POA: Diagnosis present

## 2017-11-21 DIAGNOSIS — Z882 Allergy status to sulfonamides status: Secondary | ICD-10-CM | POA: Diagnosis not present

## 2017-11-21 DIAGNOSIS — Z79899 Other long term (current) drug therapy: Secondary | ICD-10-CM

## 2017-11-21 DIAGNOSIS — G8929 Other chronic pain: Secondary | ICD-10-CM | POA: Diagnosis present

## 2017-11-21 DIAGNOSIS — T82898A Other specified complication of vascular prosthetic devices, implants and grafts, initial encounter: Secondary | ICD-10-CM | POA: Diagnosis not present

## 2017-11-21 DIAGNOSIS — F1411 Cocaine abuse, in remission: Secondary | ICD-10-CM | POA: Diagnosis present

## 2017-11-21 DIAGNOSIS — I871 Compression of vein: Secondary | ICD-10-CM | POA: Diagnosis present

## 2017-11-21 DIAGNOSIS — Z992 Dependence on renal dialysis: Secondary | ICD-10-CM | POA: Diagnosis not present

## 2017-11-21 DIAGNOSIS — B182 Chronic viral hepatitis C: Secondary | ICD-10-CM | POA: Diagnosis present

## 2017-11-21 DIAGNOSIS — Z8659 Personal history of other mental and behavioral disorders: Secondary | ICD-10-CM

## 2017-11-21 DIAGNOSIS — Z955 Presence of coronary angioplasty implant and graft: Secondary | ICD-10-CM | POA: Diagnosis not present

## 2017-11-21 DIAGNOSIS — Z91018 Allergy to other foods: Secondary | ICD-10-CM

## 2017-11-21 DIAGNOSIS — N186 End stage renal disease: Secondary | ICD-10-CM | POA: Diagnosis present

## 2017-11-21 DIAGNOSIS — D7589 Other specified diseases of blood and blood-forming organs: Secondary | ICD-10-CM | POA: Diagnosis present

## 2017-11-21 LAB — CBC WITH DIFFERENTIAL/PLATELET
ABS IMMATURE GRANULOCYTES: 0 10*3/uL (ref 0.0–0.1)
BASOS ABS: 0 10*3/uL (ref 0.0–0.1)
Basophils Relative: 1 %
Eosinophils Absolute: 0.1 10*3/uL (ref 0.0–0.7)
Eosinophils Relative: 3 %
HCT: 32.2 % — ABNORMAL LOW (ref 39.0–52.0)
HEMOGLOBIN: 9.7 g/dL — AB (ref 13.0–17.0)
IMMATURE GRANULOCYTES: 0 %
LYMPHS PCT: 35 %
Lymphs Abs: 1.3 10*3/uL (ref 0.7–4.0)
MCH: 31.8 pg (ref 26.0–34.0)
MCHC: 30.1 g/dL (ref 30.0–36.0)
MCV: 105.6 fL — ABNORMAL HIGH (ref 78.0–100.0)
MONO ABS: 0.4 10*3/uL (ref 0.1–1.0)
Monocytes Relative: 11 %
NEUTROS ABS: 1.9 10*3/uL (ref 1.7–7.7)
Neutrophils Relative %: 50 %
Platelets: 93 10*3/uL — ABNORMAL LOW (ref 150–400)
RBC: 3.05 MIL/uL — AB (ref 4.22–5.81)
RDW: 18.2 % — ABNORMAL HIGH (ref 11.5–15.5)
WBC: 3.8 10*3/uL — ABNORMAL LOW (ref 4.0–10.5)

## 2017-11-21 LAB — BASIC METABOLIC PANEL
Anion gap: 11 (ref 5–15)
BUN: 35 mg/dL — ABNORMAL HIGH (ref 8–23)
CHLORIDE: 104 mmol/L (ref 98–111)
CO2: 26 mmol/L (ref 22–32)
Calcium: 8.7 mg/dL — ABNORMAL LOW (ref 8.9–10.3)
Creatinine, Ser: 8.97 mg/dL — ABNORMAL HIGH (ref 0.61–1.24)
GFR calc non Af Amer: 6 mL/min — ABNORMAL LOW (ref >60)
GFR, EST AFRICAN AMERICAN: 6 mL/min — AB (ref >60)
Glucose, Bld: 67 mg/dL — ABNORMAL LOW (ref 70–99)
POTASSIUM: 4.2 mmol/L (ref 3.5–5.1)
SODIUM: 141 mmol/L (ref 135–145)

## 2017-11-21 LAB — CBG MONITORING, ED: GLUCOSE-CAPILLARY: 120 mg/dL — AB (ref 70–99)

## 2017-11-21 LAB — PROTIME-INR
INR: 1.07
PROTHROMBIN TIME: 13.8 s (ref 11.4–15.2)

## 2017-11-21 MED ORDER — LABETALOL HCL 200 MG PO TABS
200.0000 mg | ORAL_TABLET | Freq: Two times a day (BID) | ORAL | Status: DC
  Administered 2017-11-21 – 2017-11-22 (×2): 200 mg via ORAL
  Filled 2017-11-21 (×2): qty 1

## 2017-11-21 MED ORDER — ACETAMINOPHEN 325 MG PO TABS: 650.0000 mg | ORAL_TABLET | Freq: Four times a day (QID) | ORAL | Status: DC | PRN

## 2017-11-21 MED ORDER — ONDANSETRON HCL 4 MG PO TABS: 4.0000 mg | ORAL_TABLET | Freq: Four times a day (QID) | ORAL | Status: DC | PRN

## 2017-11-21 MED ORDER — CALCITRIOL 0.25 MCG PO CAPS: 1.5000 ug | ORAL_CAPSULE | Freq: Every day | ORAL | Status: DC

## 2017-11-21 MED ORDER — CALCIUM ACETATE (PHOS BINDER) 667 MG PO CAPS
1334.0000 mg | ORAL_CAPSULE | Freq: Three times a day (TID) | ORAL | Status: DC
  Administered 2017-11-22: 1334 mg via ORAL
  Filled 2017-11-21 (×2): qty 2

## 2017-11-21 MED ORDER — ONDANSETRON HCL 4 MG/2ML IJ SOLN: 4.0000 mg | Freq: Four times a day (QID) | INTRAMUSCULAR | Status: DC | PRN

## 2017-11-21 MED ORDER — HYDRALAZINE HCL 50 MG PO TABS
50.0000 mg | ORAL_TABLET | Freq: Three times a day (TID) | ORAL | Status: DC
  Administered 2017-11-21 – 2017-11-22 (×3): 50 mg via ORAL
  Filled 2017-11-21 (×3): qty 1

## 2017-11-21 MED ORDER — ACETAMINOPHEN 650 MG RE SUPP: 650.0000 mg | Freq: Four times a day (QID) | RECTAL | Status: DC | PRN

## 2017-11-21 MED ORDER — TRAZODONE HCL 50 MG PO TABS: 50.0000 mg | ORAL_TABLET | Freq: Every evening | ORAL | Status: DC | PRN

## 2017-11-21 MED ORDER — CINACALCET HCL 30 MG PO TABS
30.0000 mg | ORAL_TABLET | Freq: Every day | ORAL | Status: DC
  Filled 2017-11-21: qty 1

## 2017-11-21 NOTE — Consult Note (Signed)
Requested by:  Dr. Tanna Furry  Reason for consultation: recurrent superior vena cava syndrome    History of Present Illness   Logan Martin is a 62 y.o. ([birth year]) male who presents with cc: throat pain.  This patient underwent bilateral central venogram and angioplasty superior vena cava on 06/22/16 with Dr. Oneida Alar for facial swelling felt due to superior vena cava syndrome.  The patient is having recurrent sx in his neck and throat.  Pt thinks recurrent sx started <1 week ago.  The intensity of sx is variable.  He was sent to the ED for evaluation reportedly by his nephrologist.  The patient notes his sx improve after hemodialysis.  Past Medical History:  Diagnosis Date  . Alcohol abuse    quit date in 1995  . Anemia   . Chronic low back pain   . Cocaine abuse in remission ([place])    Quit date in 1995  . 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 Started HD in [place].  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  On NxStage home hemo since 2010. Using RUA AVF.    [name] ESRD on dialysis ([place])    "TTS; [place]" (06/14/2016)  . Hepatitis C   . History of blood transfusion 2007 X 1  . History of kidney stones   . Hypertension   . Peripheral vascular disease ([place])   . Pulmonary embolism [facility]) Feb 2012   Treated with coumadin x 1 year  . Renal insufficiency     Past Surgical History:  Procedure Laterality Date  . ABDOMINAL HERNIA REPAIR  ~ 2000 X 2; 2001 X1  . AV FISTULA PLACEMENT Right 2007   upper arm  . AV FISTULA REPAIR Right ~ 03/2016 & 04/2016  . BLADDER AUGMENTATION  1975  . CORONARY ANGIOPLASTY WITH STENT PLACEMENT  1999  . CYSTOSCOPY W/ URETERAL STENT PLACEMENT N/A 10/13/2017   Procedure: CYSTOSCOPY WITH IRRIGATION OF BLADDER PLACEMENT OF FOLEY;  Surgeon: Ceasar Mons, MD;  Location: [place];  Service: Urology;  Laterality: N/A;  . EXTRACORPOREAL SHOCK WAVE LITHOTRIPSY    . HERNIA REPAIR    . INGUINAL  HERNIA REPAIR Right 1975  . PERIPHERAL VASCULAR CATHETERIZATION N/A 06/22/2016   Procedure: Left Arm Venography;  Surgeon: Elam Dutch, MD;  Location: [place] CV [facility];  Service: Cardiovascular;  Laterality: N/A;  . PERIPHERAL VASCULAR CATHETERIZATION N/A 06/22/2016   Procedure: A/V Fistulagram - Right Arm;  Surgeon: Elam Dutch, MD;  Location: [place] [facility];  Service: Cardiovascular;  Laterality: N/A;  . PERIPHERAL VASCULAR CATHETERIZATION Right 06/22/2016   Procedure: Peripheral Vascular Balloon Angioplasty;  Surgeon: Elam Dutch, MD;  Location: [place] [facility];  Service: Cardiovascular;  Laterality: Right;  arm fistula     Social History   Socioeconomic History  . Marital status: Single    Spouse name: Not on file  . Number of children: Not on file  . Years of education: Not on file  . Highest education level: Not on file  Occupational History  . Not on file  Social Needs  . Financial resource strain: Not on file  . Food insecurity:    Worry: Not on file    Inability: Not on file  . Transportation needs:    Medical: Not on file    Non-medical: Not on file  Tobacco Use  . Smoking status: Former Smoker    Packs/day: 1.00  Years: 5.00    Pack years: 5.00    Types: Cigarettes    Last attempt to quit: 05/28/1993    Years since quitting: 24.5  . Smokeless tobacco: Never Used  Substance and Sexual Activity  . Alcohol use: No    Comment: 06/14/2016 "nothing since 1995"  . Drug use: No    Types: Marijuana, Cocaine    Comment: 06/14/2016 "nothing since 1995"  . Sexual activity: Yes  Lifestyle  . Physical activity:    Days per week: Not on file    Minutes per session: Not on file  . Stress: Not on file  Relationships  . Social connections:    Talks on phone: Not on file    Gets together: Not on file    Attends religious service: Not on file    Active member of club or organization: Not on file    Attends meetings of clubs or organizations: Not on file     Relationship status: Not on file  . Intimate partner violence:    Fear of current or ex partner: Not on file    Emotionally abused: Not on file    Physically abused: Not on file    Forced sexual activity: Not on file  Other Topics Concern  . Not on file  Social History Narrative  . Not on file    Family History  Problem Relation Age of Onset  . Cancer Mother   . Diabetes Mother   . Hypertension Mother   . Deep vein thrombosis Sister   . Deep vein thrombosis Daughter     No current facility-administered medications for this encounter.    Current Outpatient Medications  Medication Sig Dispense Refill  . calcitRIOL (ROCALTROL) 0.5 MCG capsule Take 1.5 mcg by mouth daily.  3  . calcium acetate (PHOSLO) 667 MG capsule Take 1,334 mg by mouth 3 (three) times daily with meals.     . cinacalcet (SENSIPAR) 30 MG tablet Take 30 mg by mouth daily. Do no take less than 12 hours prior to dialysis  11  . hydrALAZINE (APRESOLINE) 50 MG tablet Take 50 mg by mouth 3 (three) times daily.  3  . labetalol (NORMODYNE) 200 MG tablet Take 200 mg by mouth 2 (two) times daily.  4  . lidocaine-prilocaine (EMLA) cream Apply 1 application topically See admin instructions. APPLY SMALL AMOUNT TO ACCESS SITE (AVF) 1 HOUR BEFORE DIALYSIS. COVER WITH OCCLUSIVE DRESSING (SARAN WRAP)  6  . oxycodone (ROXICODONE) 30 MG immediate release tablet Take 30 mg by mouth 3 (three) times daily.    . Oxycodone HCl 10 MG TABS Take 10 mg by mouth 3 (three) times daily as needed (for pain).   0    Allergies  Allergen Reactions  . Heparin Rash    Pork products   . Pork-Derived Products Rash  . Sulfa Antibiotics Rash    REVIEW OF SYSTEMS (negative unless checked):   Cardiac:  []  Chest pain or chest pressure? []  Shortness of breath upon activity? []  Shortness of breath when lying flat? []  Irregular heart rhythm?  Vascular:  []  Pain in calf, thigh, or hip brought on by walking? []  Pain in feet at night that wakes  you up from your sleep? []  Blood clot in your veins? []  Leg swelling?  Pulmonary:  []  Oxygen at home? []  Productive cough? []  Wheezing?  Neurologic:  []  Sudden weakness in arms or legs? []  Sudden numbness in arms or legs? []  Sudden onset of difficult speaking or  slurred speech? []  Temporary loss of vision in one eye? []  Problems with dizziness?  Gastrointestinal:  []  Blood in stool? []  Vomited blood?  Genitourinary:  []  Burning when urinating? []  Blood in urine?  Psychiatric:  []  Major depression  Hematologic:  []  Bleeding problems? []  Problems with blood clotting?  Dermatologic:  []  Rashes or ulcers?  Constitutional:  []  Fever or chills?  Ear/Nose/Throat:  []  Change in hearing? []  Nose bleeds? [x]  Sore throat? [x]  Facial swelling?  Musculoskeletal:  []  Back pain? []  Joint pain? []  Muscle pain?   Physical Examination     Vitals:   11/21/17 1403  BP: (!) 171/85  Pulse: (!) 104  Resp: 16  Temp: 98.1 F (36.7 C)  TempSrc: Oral  SpO2: 97%   There is no height or weight on file to calculate BMI.  General Alert, O x 3, WD, NAD  Neck Supple, mid-line trachea,  somewhat edematous neck   Pulmonary Sym exp, good B air movt, CTA B, no upper airway sounds  Cardiac RRR, Nl S1, S2, no Murmurs, No rubs, No S3,S4     Radiology     CTA Chest (11/13/17): The stented portions of the right subclavian and innominate veins are patent. There is severe narrowing of the superior vena cava just below the stent as described above. There is collateral filling of the azygos system from a right upper extremity injection. Correlate with a history of right upper extremity swelling and the need for repeat venogram.  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  I reviewed the CTA, the imaging is not optimally timed to evaluate the SVC, but there is an implication of high grade  SVC stenosis on the reconstructed images.   Laboratory   CBC CBC Latest Ref Rng & Units 11/21/2017 10/14/2017 10/13/2017  WBC 4.0 - 10.5 K/uL 3.8(L) 3.0(L) 3.8(L)  Hemoglobin 13.0 - 17.0 g/dL 9.7(L) 11.2(L) 10.5(L)  Hematocrit 39.0 - 52.0 % 32.2(L) 34.3(L) 33.2(L)  Platelets 150 - 400 K/uL PENDING 175 145(L)    BMP BMP Latest Ref Rng & Units 10/14/2017 10/13/2017 07/14/2017  Glucose 65 - 99 mg/dL 102(H) 84 121(H)  BUN 6 - 20 mg/dL 49(H) 39(H) 34(H)  Creatinine 0.61 - 1.24 mg/dL 8.49(H) 8.55(H) 7.44(H)  BUN/Creat Ratio 6 - 22 (calc) - - -  Sodium 135 - 145 mmol/L 142 140 138  Potassium 3.5 - 5.1 mmol/L 4.8 4.6 4.2  Chloride 101 - 111 mmol/L 105 101 99(L)  CO2 22 - 32 mmol/L 23 29 25   Calcium 8.9 - 10.3 mg/dL 8.9 8.8(L) 9.2    Coagulation Lab Results  Component Value Date   INR 1.10 12/06/2010   INR 1.86 (H) 11/28/2010   INR (HH) 07/27/2010    5.07 REPEATED TO VERIFY CRITICAL RESULT CALLED TO, READ BACK BY AND VERIFIED WITH: J.MALONE,RN 07/27/10 1352 BY BSLADE   No results found for: PTT  Lipids No results found for: CHOL, TRIG, HDL, CHOLHDL, VLDL, LDLCALC, LDLDIRECT   Medical Decision Making   LIAHM GRIVAS is a 62 y.o. 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 Trula Slade  I discussed with the patient the nature of angiographic procedures, especially the limited patencies of any endovascular intervention.    The patient is aware of that the  risks of an angiographic procedure include but are not limited to: bleeding, infection, access site complications, renal failure, embolization, rupture of vessel, dissection, arteriovenous fistula, possible need for emergent surgical intervention, possible need for surgical procedures to treat the patient's pathology, anaphylactic reaction to contrast, and stroke and death.    The  patient is aware of the risks and agrees to proceed.  Thank you for allowing [place] to participate in this patient's care.   Adele Barthel, MD, FACS [facility] Office: [phone] Pager: [phone]  11/21/2017, 5:22 PM

## 2017-11-21 NOTE — ED Triage Notes (Addendum)
Patient complains of sore throat since last week. States he was told by his nephrologist after a CT of his neck that if the pain and swelling gets any worse to come to emergency department. Patient states he had a similar episode in the past the was caused by the narrowing of a blood vessel. T,TH,Sat dialysis, graft in right upper arm. Patient alert, oriented, and ambulating independently with steady gait.

## 2017-11-21 NOTE — H&P (Signed)
History and Physical    Logan Martin IOX:[id number] DOB: 1956/01/19 DOA: 11/21/2017  PCP: [facility]   Patient coming from: Home    Chief Complaint: Sorethroat  HPI: Logan Martin is a 62 y.o. male with medical history significant for CAD, ESRD, Hep C, hyperparathyroidism, PE, HTN, superior vena cava stenosis who presented to the ED, with complaints of sore throat for 1 week duration.  Patient also reports swelling of his head neck and shoulders, worse in the morning, but as the day goes on it improves and dialysis also improves swelling.  Patient saw his provider as an outpatient-who did a CTA chest, aorta with contrast- 11/13/2017-severe narrowing of the SVC just below the previous stent.  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  It was told to come to the ED if pain became more severe.  Patient otherwise denies shortness of breath or chest pain, no fever or chills, no leg swelling.  He makes no urine.   Recent hospital admission 5/19-5/21/19- for pyelocystitis.  ED Course: Heart rate 104, blood pressure systolic 268/34, platelets low 93, hemoglobin drop 9.7 from 11.2, with macrocytosis- 105.  Glucose low 67. Patient was evaluated by a vascular surgery- Dr. Bridgett Larsson in the ED-who recommended hospitalist admission, will try to add on patient for central venogram and possible SVC venoplasty tomorrow with Dr. Trula Slade.   Review of Systems: As per HPI otherwise 10 point review of systems negative.  Past Medical History:  Diagnosis Date  . Alcohol abuse    quit date in 1995  . Anemia   . Chronic low back pain   . Cocaine abuse in remission ([place])    Quit date in 1995  . 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 Started HD in [place].  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  On NxStage home hemo since 2010. Using RUA AVF.    [name] ESRD on dialysis ([place])    "TTS;  [place]" (06/14/2016)  . Hepatitis C   . History of blood transfusion 2007 X 1  . History of kidney stones   . Hypertension   . Peripheral vascular disease ([place])   . Pulmonary embolism [facility]) Feb 2012   Treated with coumadin x 1 year  . Renal insufficiency     Past Surgical History:  Procedure Laterality Date  . ABDOMINAL HERNIA REPAIR  ~ 2000 X 2; 2001 X1  . AV FISTULA PLACEMENT Right 2007   upper arm  . AV FISTULA REPAIR Right ~ 03/2016 & 04/2016  . BLADDER AUGMENTATION  1975  . CORONARY ANGIOPLASTY WITH STENT PLACEMENT  1999  . CYSTOSCOPY W/ URETERAL STENT PLACEMENT N/A 10/13/2017   Procedure: CYSTOSCOPY WITH IRRIGATION OF BLADDER PLACEMENT OF FOLEY;  Surgeon: Ceasar Mons, MD;  Location: [place];  Service: Urology;  Laterality: N/A;  . EXTRACORPOREAL SHOCK WAVE LITHOTRIPSY    . HERNIA REPAIR    . INGUINAL HERNIA REPAIR Right 1975  . PERIPHERAL VASCULAR CATHETERIZATION N/A 06/22/2016   Procedure: Left Arm Venography;  Surgeon: Elam Dutch, MD;  Location: [place] [facility];  Service: Cardiovascular;  Laterality: N/A;  . PERIPHERAL VASCULAR CATHETERIZATION N/A 06/22/2016   Procedure: A/V Fistulagram - Right Arm;  Surgeon: Elam Dutch, MD;  Location: [place] CV [facility];  Service: Cardiovascular;  Laterality: N/A;  . PERIPHERAL VASCULAR CATHETERIZATION Right 06/22/2016   Procedure: Peripheral Vascular Balloon Angioplasty;  Surgeon: Juanda Crumble  Antony Blackbird, MD;  Location: [place] [facility];  Service: Cardiovascular;  Laterality: Right;  arm fistula     reports that he quit smoking about 24 years ago. His smoking use included cigarettes. He has a 5.00 pack-year smoking history. He has never used smokeless tobacco. He reports that he does not drink alcohol or use drugs.  Allergies  Allergen Reactions  . Heparin Rash    Pork products   . Pork-Derived Products Rash  . Sulfa Antibiotics Rash    Family History  Problem Relation Age of Onset  . Cancer Mother   .  Diabetes Mother   . Hypertension Mother   . Deep vein thrombosis Sister   . Deep vein thrombosis Daughter     Prior to Admission medications   Medication Sig Start Date End Date Taking? Authorizing Provider  calcitRIOL (ROCALTROL) 0.5 MCG capsule Take 1.5 mcg by mouth daily. 10/25/17  Yes [provider]  calcium acetate (PHOSLO) 667 MG capsule Take 1,334 mg by mouth 3 (three) times daily with meals.    Yes [provider]  cinacalcet (SENSIPAR) 30 MG tablet Take 30 mg by mouth daily. Do no take less than 12 hours prior to dialysis 10/21/17  Yes [provider]  hydrALAZINE (APRESOLINE) 50 MG tablet Take 50 mg by mouth 3 (three) times daily. 05/11/16  Yes [provider]  labetalol (NORMODYNE) 200 MG tablet Take 200 mg by mouth 2 (two) times daily. 05/11/16  Yes [provider]  lidocaine-prilocaine (EMLA) cream Apply 1 application topically See admin instructions. APPLY SMALL AMOUNT TO ACCESS SITE (AVF) 1 HOUR BEFORE DIALYSIS. COVER WITH OCCLUSIVE DRESSING (SARAN WRAP) 11/14/17  Yes [provider]  oxycodone (ROXICODONE) 30 MG immediate release tablet Take 30 mg by mouth 3 (three) times daily.   Yes [provider]  Oxycodone HCl 10 MG TABS Take 10 mg by mouth 3 (three) times daily as needed (for pain).  05/22/16  Yes [provider]    Physical Exam: Vitals:   11/21/17 1403  BP: (!) 171/85  Pulse: (!) 104  Resp: 16  Temp: 98.1 F (36.7 C)  TempSrc: Oral  SpO2: 97%    Constitutional: NAD, calm, comfortable, no appreciable facial, neck or shoulder swelling on exam Vitals:   11/21/17 1403  BP: (!) 171/85  Pulse: (!) 104  Resp: 16  Temp: 98.1 F (36.7 C)  TempSrc: Oral  SpO2: 97%   Eyes: PERRL, lids and conjunctivae normal ENMT: Mucous membranes are  Mildly dry. Posterior pharynx clear of any exudate or lesions. Neck: normal, supple, no masses, no thyromegaly Respiratory: clear to auscultation bilaterally,  no wheezing, no crackles. Normal respiratory effort. No accessory muscle use.   Cardiovascular: Regular rate and rhythm, no murmurs / rubs / gallops. No extremity edema. 2+ pedal pulses.  Abdomen: full, but no tenderness, no masses palpated. No hepatosplenomegaly. Bowel sounds positive.  Musculoskeletal: no clubbing / cyanosis. No joint deformity upper and lower extremities. Good ROM, no contractures. Normal muscle tone.  Skin: no rashes, lesions, ulcers. No induration Neurologic: CN 2-12 grossly intact. Strength 5/5 in all 4.  Psychiatric: Normal judgment and insight. Alert and oriented x 3. Normal mood.   Labs on Admission: I have personally reviewed following labs and imaging studies  CBC: Recent Labs  Lab 11/21/17 1705  WBC 3.8*  NEUTROABS 1.9  HGB 9.7*  HCT 32.2*  MCV 105.6*  PLT 93*   Basic Metabolic Panel: Recent Labs  Lab 11/21/17 1705  NA 141  K 4.2  CL 104  CO2 26  GLUCOSE 67*  BUN 35*  CREATININE 8.97*  CALCIUM 8.7*   Coagulation Profile: Recent Labs  Lab 11/21/17 1749  INR 1.07   Urine analysis:    Component Value Date/Time   COLORURINE BROWN (A) 01/15/2017 1747   APPEARANCEUR TURBID (A) 01/15/2017 1747   LABSPEC 1.015 01/15/2017 1747   PHURINE 8.5 (H) 01/15/2017 1747   GLUCOSEU 250 (A) 01/15/2017 1747   HGBUR LARGE (A) 01/15/2017 1747   BILIRUBINUR LARGE (A) 01/15/2017 1747   KETONESUR 15 (A) 01/15/2017 1747   PROTEINUR >300 (A) 01/15/2017 1747   NITRITE POSITIVE (A) 01/15/2017 1747   LEUKOCYTESUR LARGE (A) 01/15/2017 1747    Radiological Exams on Admission: CTA Chest Aorta W &/Or Wo contrast- IMPRESSION: The stented portions of the right subclavian and innominate veins are patent. There is severe narrowing of the superior vena cava just below the stent as described above. There is collateral filling of the azygos system from a right upper extremity injection. Correlate with a history of right upper extremity swelling and the need for repeat  venogram.  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  EKG: None  Assessment/Plan Active Problems:   ESRD on dialysis [facility])   Chronic hepatitis C without hepatic coma (HCC)   Pancytopenia (HCC)   SVC syndrome   SVC Stenosis- CTA-severe narrowing of the SVC just below the stented portions of the right subclavian and innominate veins.  Hx of same requiring stenting of SVC-   Evaluated by vascular surgeon Dr. Bridgett Larsson in ED - central venogram and possible SVC venoplasty tomorrow with Dr. Trula Slade. - NPO midnight - Scds  ESRD- Home HD. Sat Sun Tues Wed. Last HD- yesterday Wednesday. - Nephrology consult tomorrow  -Continue home calcitriol, PhosLo, cinacalcet  HTN-pressure elevated systolic 325Q -New home labetalol, hydralazine  Pancytopenia-chronic.  WBC- 3.8, hemoglobin 9.7-baseline 9-12 with increased MCV- 105, plaletlets 93. HCV hx, ESRD. - B12, folate, iron studies   DVT prophylaxis: Scds for Procedure and Low platelet Code Status: Full Family Communication:None at bedside Disposition Plan: Per rounding and consult team Consults called: Dr. Bridgett Larsson- VAscular surgery Admission status: Inpt, tele   Bethena Roys MD [facility] Pager [phone] From 6PM-2AM.  Otherwise please contact night-coverage [email or web address] Password [facility]  11/21/2017, 8:03 PM

## 2017-11-21 NOTE — ED Provider Notes (Signed)
Patient placed in Quick Look pathway, seen and evaluated   Chief Complaint: throat pain  HPI:   Logan Martin is a 62 y.o. male who presents to the ED with throat pain that started last week.States he was told by his nephrologist after a CT of his neck that if the pain and swelling gets any worse to come to emergency department. Patient states he had a similar episode in the past the was caused by the narrowing of a blood vessel. T,TH,Sat dialysis, graft in right upper arm. Patient reports that the CT scan showed narrowing in the airway.  ROS: ENT: sore throat  Physical Exam:  BP (!) 171/85 (BP Location: Right Arm)   Pulse (!) 104   Temp 98.1 F (36.7 C) (Oral)   Resp 16   SpO2 97%    Gen: No distress  Neuro: Awake and Alert  Skin: Warm and dry  ENT: uvula midline, swelling noted   Initiation of care has begun. The patient has been counseled on the process, plan, and necessity for staying for the completion/evaluation, and the remainder of the medical screening examination    Ashley Murrain, NP 11/21/17 1441    Charlesetta Shanks, MD 11/29/17 [phone]

## 2017-11-22 ENCOUNTER — Encounter (HOSPITAL_COMMUNITY)
Admission: EM | Discharge status: Against Medical Advice | Payer: Self-pay | Source: Home / Self Care | Attending: Internal Medicine

## 2017-11-22 DIAGNOSIS — I871 Compression of vein: Secondary | ICD-10-CM

## 2017-11-22 DIAGNOSIS — Z992 Dependence on renal dialysis: Secondary | ICD-10-CM

## 2017-11-22 DIAGNOSIS — N186 End stage renal disease: Secondary | ICD-10-CM

## 2017-11-22 HISTORY — PX: A/V FISTULAGRAM: CATH118298

## 2017-11-22 HISTORY — PX: PERIPHERAL VASCULAR BALLOON ANGIOPLASTY: CATH118281

## 2017-11-22 LAB — RETICULOCYTES
RBC.: 3.04 MIL/uL — ABNORMAL LOW (ref 4.22–5.81)
RETIC COUNT ABSOLUTE: 76 10*3/uL (ref 19.0–186.0)
Retic Ct Pct: 2.5 % (ref 0.4–3.1)

## 2017-11-22 LAB — IRON AND TIBC
IRON: 63 ug/dL (ref 45–182)
SATURATION RATIOS: 41 % — AB (ref 17.9–39.5)
TIBC: 153 ug/dL — AB (ref 250–450)
UIBC: 90 ug/dL

## 2017-11-22 LAB — CBC
HEMATOCRIT: 31.5 % — AB (ref 39.0–52.0)
HEMOGLOBIN: 9.7 g/dL — AB (ref 13.0–17.0)
MCH: 31.9 pg (ref 26.0–34.0)
MCHC: 30.8 g/dL (ref 30.0–36.0)
MCV: 103.6 fL — AB (ref 78.0–100.0)
Platelets: 99 10*3/uL — ABNORMAL LOW (ref 150–400)
RBC: 3.04 MIL/uL — AB (ref 4.22–5.81)
RDW: 17.9 % — AB (ref 11.5–15.5)
WBC: 3.8 10*3/uL — ABNORMAL LOW (ref 4.0–10.5)

## 2017-11-22 LAB — FERRITIN: FERRITIN: 1278 ng/mL — AB (ref 24–336)

## 2017-11-22 LAB — FOLATE: Folate: 5.5 ng/mL — ABNORMAL LOW (ref >5.9)

## 2017-11-22 LAB — VITAMIN B12: Vitamin B-12: 222 pg/mL (ref 180–914)

## 2017-11-22 SURGERY — PERIPHERAL VASCULAR BALLOON ANGIOPLASTY
Anesthesia: LOCAL

## 2017-11-22 MED ORDER — LIDOCAINE HCL (PF) 1 % IJ SOLN
INTRAMUSCULAR | Status: AC
  Filled 2017-11-22: qty 30

## 2017-11-22 MED ORDER — LIDOCAINE HCL (PF) 1 % IJ SOLN
INTRAMUSCULAR | Status: DC | PRN
  Administered 2017-11-22: 5 mL

## 2017-11-22 MED ORDER — SODIUM CHLORIDE 0.9 % IV SOLN
INTRAVENOUS | Status: AC | PRN
  Administered 2017-11-22: 1 mL via INTRAVENOUS

## 2017-11-22 MED ORDER — OXYCODONE HCL 5 MG PO TABS: 10.0000 mg | ORAL_TABLET | Freq: Three times a day (TID) | ORAL | Status: DC | PRN

## 2017-11-22 MED ORDER — HEPARIN (PORCINE) IN NACL 2-0.9 UNITS/ML: INTRAMUSCULAR | Status: DC | PRN

## 2017-11-22 SURGICAL SUPPLY — 16 items
BAG SNAP BAND KOVER 36X36 (MISCELLANEOUS) ×3 IMPLANT
BALLN ATLAS 14X40X75 (BALLOONS) ×3
BALLN MUSTANG 12.0X40 75 (BALLOONS) ×3
BALLOON ATLAS 14X40X75 (BALLOONS) ×2 IMPLANT
BALLOON MUSTANG 12.0X40 75 (BALLOONS) ×2 IMPLANT
CATH BEACON 5 .035 65 KMP TIP (CATHETERS) ×3 IMPLANT
COVER DOME SNAP 22 D (MISCELLANEOUS) ×3 IMPLANT
COVER PRB 48X5XTLSCP FOLD TPE (BAG) ×2 IMPLANT
COVER PROBE 5X48 (BAG) ×1
KIT ENCORE 26 ADVANTAGE (KITS) ×3 IMPLANT
KIT MICROPUNCTURE NIT STIFF (SHEATH) ×3 IMPLANT
SHEATH PINNACLE R/O II 7F 4CM (SHEATH) ×3 IMPLANT
STOPCOCK MORSE 400PSI 3WAY (MISCELLANEOUS) ×3 IMPLANT
TRAY PV CATH (CUSTOM PROCEDURE TRAY) ×3 IMPLANT
TUBING CIL FLEX 10 FLL-RA (TUBING) ×3 IMPLANT
WIRE BENTSON .035X145CM (WIRE) ×3 IMPLANT

## 2017-11-22 NOTE — Progress Notes (Signed)
Patient back to room from cath lab.

## 2017-11-22 NOTE — ED Provider Notes (Signed)
[facility] 4E CV SURGICAL PROGRESSIVE CARE Provider Note   CSN: [id number] Arrival date & time: 11/21/17  1357     History   Chief Complaint Chief Complaint  Patient presents with  . Sore Throat    HPI Logan Martin is a 62 y.o. male.  Chief complaint is difficulty breathing and swallowing, neck swelling  HPI 62 year old male.  History of end-stage renal disease and on S/S/W/R home HD.  History of previous SVC syndrome requiring SVC stent in [place] over 10 years ago.  Underwent angioplasty with Dr. Oneida Alar of this stent 1 year ago.  Has had recurrent symptoms of facial plethora submental swelling and a sensation of difficulty breathing and swallowing in the morning.  This does get better with upright activity and dialysis.  Had outpatient CT scan performed by his nephrologist several days ago which showed recurrence and critical stenosis of his SVC at the [facility].  Presents here with progressive symptoms  Past Medical History:  Diagnosis Date  . Alcohol abuse    quit date in 1995  . Anemia   . Chronic low back pain   . Cocaine abuse in remission ([place])    Quit date in 1995  . 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 Started HD in [place].  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  On NxStage home hemo since 2010. Using RUA AVF.    [name] ESRD on dialysis ([place])    "TTS; [facility]" (06/14/2016)  . Hepatitis C   . History of blood transfusion 2007 X 1  . History of kidney stones   . Hypertension   . Peripheral vascular disease ([place])   . Pulmonary embolism [facility]) Feb 2012   Treated with coumadin x 1 year  . Renal insufficiency     Patient Active Problem List   Diagnosis Date Noted  . 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 HTN (hypertension) 10/14/2017  . Pyelocystitis 10/13/2017  . Pancytopenia ([place]) 06/14/2016  . Left arm pain 06/14/2016  . 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 Mechanical complication of other vascular device,  implant, and graft 08/10/2013  . End stage renal disease ([place]) 08/10/2013  . ESRD (end stage renal disease) ([place]) 06/07/2013  . Nausea and vomiting 06/07/2013  . SOB (shortness of breath) 05/10/2013  . Chest discomfort 11/27/2012  . Shortness of breath 11/27/2012  . Chronic hepatitis C without hepatic coma ([place]) 02/04/2012  . Secondary hyperparathyroidism (of renal origin) 02/04/2012  . Anemia associated with chronic renal failure 02/04/2012  . CAD (coronary atherosclerotic disease) 02/04/2012  . Pulmonary embolism ([place]) 07/20/2010  . CORONARY ARTERY DISEASE 02/23/2009  . HEMORRHOIDS, INTERNAL 02/23/2009  . GERD 02/23/2009  . ESRD on dialysis [facility]) 02/16/2009    Past Surgical History:  Procedure Laterality Date  . ABDOMINAL HERNIA REPAIR  ~ 2000 X 2; 2001 X1  . AV FISTULA PLACEMENT Right 2007   upper arm  . AV FISTULA REPAIR Right ~ 03/2016 & 04/2016  . BLADDER AUGMENTATION  1975  . CORONARY ANGIOPLASTY WITH STENT PLACEMENT  1999  . CYSTOSCOPY W/ URETERAL STENT PLACEMENT N/A 10/13/2017   Procedure: CYSTOSCOPY WITH IRRIGATION OF BLADDER PLACEMENT OF FOLEY;  Surgeon: Ceasar Mons, MD;  Location: [place];  Service: Urology;  Laterality: N/A;  . EXTRACORPOREAL SHOCK WAVE LITHOTRIPSY    . HERNIA REPAIR    . INGUINAL HERNIA REPAIR Right 1975  . PERIPHERAL VASCULAR CATHETERIZATION N/A 06/22/2016   Procedure: Left Arm Venography;  Surgeon: Elam Dutch, MD;  Location: [place] CV LAB;  Service: Cardiovascular;  Laterality: N/A;  . PERIPHERAL VASCULAR CATHETERIZATION N/A 06/22/2016   Procedure: A/V Fistulagram - Right Arm;  Surgeon: Elam Dutch, MD;  Location: [place] CV LAB;  Service: Cardiovascular;  Laterality: N/A;  . PERIPHERAL VASCULAR CATHETERIZATION Right 06/22/2016   Procedure: Peripheral Vascular Balloon Angioplasty;  Surgeon: Elam Dutch, MD;  Location: [place] CV LAB;  Service: Cardiovascular;  Laterality: Right;  arm fistula        Home  Medications    Prior to Admission medications   Medication Sig Start Date End Date Taking? Authorizing Provider  calcitRIOL (ROCALTROL) 0.5 MCG capsule Take 1.5 mcg by mouth daily. 10/25/17  Yes [provider]  calcium acetate (PHOSLO) 667 MG capsule Take 1,334 mg by mouth 3 (three) times daily with meals.    Yes [provider]  cinacalcet (SENSIPAR) 30 MG tablet Take 30 mg by mouth daily. Do no take less than 12 hours prior to dialysis 10/21/17  Yes [provider]  hydrALAZINE (APRESOLINE) 50 MG tablet Take 50 mg by mouth 3 (three) times daily. 05/11/16  Yes [provider]  labetalol (NORMODYNE) 200 MG tablet Take 200 mg by mouth 2 (two) times daily. 05/11/16  Yes [provider]  lidocaine-prilocaine (EMLA) cream Apply 1 application topically See admin instructions. APPLY SMALL AMOUNT TO ACCESS SITE (AVF) 1 HOUR BEFORE DIALYSIS. COVER WITH OCCLUSIVE DRESSING ([place]) 11/14/17  Yes [provider]  oxycodone (ROXICODONE) 30 MG immediate release tablet Take 30 mg by mouth 3 (three) times daily.   Yes [provider]  Oxycodone HCl 10 MG TABS Take 10 mg by mouth 3 (three) times daily as needed (for pain).  05/22/16  Yes [provider]    Family History Family History  Problem Relation Age of Onset  . Cancer Mother   . Diabetes Mother   . Hypertension Mother   . Deep vein thrombosis Sister   . Deep vein thrombosis Daughter     Social History Social History   Tobacco Use  . Smoking status: Former Smoker    Packs/day: 1.00    Years: 5.00    Pack years: 5.00    Types: Cigarettes    Last attempt to quit: 05/28/1993    Years since quitting: 24.5  . Smokeless tobacco: Never Used  Substance Use Topics  . Alcohol use: No    Comment: 06/14/2016 "nothing since 1995"  . Drug use: No    Types: Marijuana, Cocaine    Comment: 06/14/2016 "nothing since 1995"     Allergies   Heparin; Pork-derived products; and Sulfa  antibiotics   Review of Systems Review of Systems  Constitutional: Negative for appetite change, chills, diaphoresis, fatigue and fever.  HENT: Negative for mouth sores, sore throat and trouble swallowing.        Facial and neck swelling.  Sensation of difficulty breathing and swallowing  Eyes: Negative for visual disturbance.  Respiratory: Negative for cough, chest tightness, shortness of breath and wheezing.   Cardiovascular: Negative for chest pain.  Gastrointestinal: Negative for abdominal distention, abdominal pain, diarrhea, nausea and vomiting.  Endocrine: Negative for polydipsia, polyphagia and polyuria.  Genitourinary: Negative for dysuria, frequency and hematuria.  Musculoskeletal: Negative for gait problem.  Skin: Negative for color change, pallor and rash.  Neurological: Negative for dizziness, syncope, light-headedness and headaches.  Hematological: Does not bruise/bleed easily.  Psychiatric/Behavioral: Negative for behavioral problems and confusion.  Physical Exam Updated Vital Signs BP (!) 164/88 (BP Location: Left Arm)   Pulse 87   Temp 97.6 F (36.4 C) (Oral)   Resp (!) 22   Ht 5\' 4"  (1.626 m)   Wt 51.9 kg (114 lb 6.4 oz)   SpO2 96%   BMI 19.64 kg/m   Physical Exam  Constitutional: He is oriented to person, place, and time. He appears well-developed and well-nourished. No distress.  HENT:  Head: Normocephalic.  Soft sub-mental edema.  Not brawny.  He is not drooling or stridorous.  Eyes: Pupils are equal, round, and reactive to light. Conjunctivae are normal. No scleral icterus.  Neck: Normal range of motion. Neck supple. No thyromegaly present.  Cardiovascular: Normal rate and regular rhythm. Exam reveals no gallop and no friction rub.  No murmur heard. Pulmonary/Chest: Effort normal and breath sounds normal. No respiratory distress. He has no wheezes. He has no rales.  Abdominal: Soft. Bowel sounds are normal. He exhibits no distension. There is no  tenderness. There is no rebound.  Musculoskeletal: Normal range of motion.  Neurological: He is alert and oriented to person, place, and time.  Skin: Skin is warm and dry. No rash noted.  Psychiatric: He has a normal mood and affect. His behavior is normal.     ED Treatments / Results  Labs (all labs ordered are listed, but only abnormal results are displayed) Labs Reviewed  CBC WITH DIFFERENTIAL/PLATELET - Abnormal; Notable for the following components:      Result Value   WBC 3.8 (*)    RBC 3.05 (*)    Hemoglobin 9.7 (*)    HCT 32.2 (*)    MCV 105.6 (*)    RDW 18.2 (*)    Platelets 93 (*)    All other components within normal limits  BASIC METABOLIC PANEL - Abnormal; Notable for the following components:   Glucose, Bld 67 (*)    BUN 35 (*)    Creatinine, Ser 8.97 (*)    Calcium 8.7 (*)    GFR calc non Af Amer 6 (*)    GFR calc Af Amer 6 (*)    All other components within normal limits  CBG MONITORING, ED - Abnormal; Notable for the following components:   Glucose-Capillary 120 (*)    All other components within normal limits  PROTIME-INR  CBC  VITAMIN B12  FOLATE  IRON AND TIBC  FERRITIN  RETICULOCYTES    EKG None  Radiology No results found.  Procedures Procedures (including critical care time)  Medications Ordered in ED Medications  calcitRIOL (ROCALTROL) capsule 1.5 mcg (has no administration in time range)  calcium acetate (PHOSLO) capsule 1,334 mg (has no administration in time range)  cinacalcet (SENSIPAR) tablet 30 mg (has no administration in time range)  hydrALAZINE (APRESOLINE) tablet 50 mg (50 mg Oral Given 11/21/17 2126)  labetalol (NORMODYNE) tablet 200 mg (200 mg Oral Given 11/21/17 2126)  acetaminophen (TYLENOL) tablet 650 mg (has no administration in time range)    Or  acetaminophen (TYLENOL) suppository 650 mg (has no administration in time range)  ondansetron (ZOFRAN) tablet 4 mg (has no administration in time range)    Or  ondansetron  (ZOFRAN) injection 4 mg (has no administration in time range)  traZODone (DESYREL) tablet 50 mg (has no administration in time range)     Initial Impression / Assessment and Plan / ED Course  I have reviewed the triage vital signs and the nursing notes.  Pertinent labs & imaging results  that were available during my care of the patient were reviewed by me and considered in my medical decision making (see chart for details).    I discussed the case with Dr. Bridgett Larsson of vascular surgery.  He has evaluated the patient.  He will discuss with his partner regarding repeat angioplasty this area tomorrow.  Discussed with medicine.  Will be admitted to help manage his HD.  Final Clinical Impressions(s) / ED Diagnoses   Final diagnoses:  SVC syndrome    ED Discharge Orders    None       Tanna Furry, MD 11/22/17 [phone]

## 2017-11-22 NOTE — Progress Notes (Addendum)
Patient called [place] into the room to tell us his aunt in [place] had been in a bad car accident and that he wanted to sign himself out. Patient signed AMA papers and walked himself out. Dr. Erlinda Hong made aware and she asked me to notify the vascular surgeon on call. I paged Dr. Trula Slade and am awaiting his call back. Charge nurse made aware.   I instructed the patient to seek immediate medical assistance if his arm was to start bleeding where his procedure was done today. I also instructed him to call his vascular surgeon in the morning for further instructions.

## 2017-11-22 NOTE — Progress Notes (Addendum)
PROGRESS NOTE  Logan Martin KGU:[id number] DOB: 1955-08-24 DOA: 11/21/2017 PCP: [facility]  HPI/Recap of past 24 hours:  Feeling better, on room air, talking on the phone  He does not want to be on renal diet  Assessment/Plan: Active Problems:   ESRD on dialysis [facility])   Chronic hepatitis C without hepatic coma (HCC)   Pancytopenia (HCC)   SVC syndrome  svc syndrome To have central venogram and possible SVC venoplasty today vascular input appreciated  ESRD on home dialysis (T/W/T/S) Patient does not want renal diet, diet changed to regular diet per patient's request  Anemia of chronic disease He does has macrocytosis He has borderline low folate and low normal b12 Will benefit from b12/folate supplement  HTN, stable on home meds  H/o hepc, treated in 2018  Chronic pain: continue home meds   Code Status: full  Family Communication: patient   Disposition Plan: need vascular surgery clearance   Consultants:  Vascular surgery  Nephrology ( case discussed with nephrology Dr Jonnie Finner)  Procedures:  Venography by vascular today on 6/28  Antibiotics:  none   Objective: BP (!) 166/84 (BP Location: Left Arm)   Pulse 89   Temp 97.9 F (36.6 C) (Oral)   Resp 18   Ht 5\' 4"  (1.626 m)   Wt 51.9 kg (114 lb 6.4 oz)   SpO2 100%   BMI 19.64 kg/m   Intake/Output Summary (Last 24 hours) at 11/22/2017 6283 Last data filed at 11/21/2017 2003 Gross per 24 hour  Intake 200 ml  Output -  Net 200 ml   Filed Weights   11/21/17 2101  Weight: 51.9 kg (114 lb 6.4 oz)    Exam: Patient is examined daily including today on 11/22/2017, exams remain the same as of yesterday except that has changed    General:  NAD, mild neck edema  Cardiovascular: RRR  Respiratory: CTABL  Abdomen: Soft/ND/NT, positive BS  Musculoskeletal: No Edema  Neuro: alert, oriented   Data Reviewed: Basic Metabolic Panel: Recent Labs  Lab 11/21/17 1705  NA 141   K 4.2  CL 104  CO2 26  GLUCOSE 67*  BUN 35*  CREATININE 8.97*  CALCIUM 8.7*   Liver Function Tests: No results for input(s): AST, ALT, ALKPHOS, BILITOT, PROT, ALBUMIN in the last 168 hours. No results for input(s): LIPASE, AMYLASE in the last 168 hours. No results for input(s): AMMONIA in the last 168 hours. CBC: Recent Labs  Lab 11/21/17 1705 11/22/17 0615  WBC 3.8* 3.8*  NEUTROABS 1.9  --   HGB 9.7* 9.7*  HCT 32.2* 31.5*  MCV 105.6* 103.6*  PLT 93* PENDING   Cardiac Enzymes:   No results for input(s): CKTOTAL, CKMB, CKMBINDEX, TROPONINI in the last 168 hours. BNP (last 3 results) No results for input(s): BNP in the last 8760 hours.  ProBNP (last 3 results) No results for input(s): PROBNP in the last 8760 hours.  CBG: Recent Labs  Lab 11/21/17 2023  GLUCAP 120*    No results found for this or any previous visit (from the past 240 hour(s)).   Studies: No results found.  Scheduled Meds: . calcitRIOL  1.5 mcg Oral Daily  . calcium acetate  1,334 mg Oral TID WC  . cinacalcet  30 mg Oral Daily  . hydrALAZINE  50 mg Oral TID  . labetalol  200 mg Oral BID    Continuous Infusions:   Time spent: 66mins I have personally reviewed and interpreted on  11/22/2017  daily labs, tele strips, imagings as discussed above under date review session and assessment and plans.  I reviewed all nursing notes, pharmacy notes, consultant notes,  vitals, pertinent old records  I have discussed plan of care as described above with RN , patient on 11/22/2017   Florencia Reasons MD, PhD  [facility] Pager [phone]. If 7PM-7AM, please contact night-coverage at [email or web address], password [facility] 11/22/2017, 7:52 AM  LOS: 1 day

## 2017-11-22 NOTE — Discharge Summary (Signed)
Discharge Summary  Logan Martin VFI:[id number] DOB: [birth year]  PCP: [facility]  Admit date: 11/21/2017 Signed out AMA on : 11/22/2017     Discharge Diagnoses:  Active Hospital Problems   Diagnosis Date Noted  . 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 Pancytopenia ([place]) 06/14/2016  . Chronic hepatitis C without hepatic coma ([place]) 02/04/2012  . ESRD on dialysis [facility]) 02/16/2009    Resolved Hospital Problems  No resolved problems to display.    Discharge Condition: stable  Diet recommendation: he reports he does not follow renal diet at home  [facility] Weights   11/21/17 2101  Weight: 51.9 kg (114 lb 6.4 oz)    History of present illness:  PCP: [facility]   Patient coming from: Home    Chief Complaint: Sorethroat  HPI: Logan Martin is a 62 y.o. male with medical history significant for CAD, ESRD, Hep C, hyperparathyroidism, PE, HTN, superior vena cava stenosis who presented to the ED, with complaints of sore throat for 1 week duration.  Patient also reports swelling of his head neck and shoulders, worse in the morning, but as the day goes on it improves and dialysis also improves swelling.  Patient saw his provider as an outpatient-who did a CTA chest, aorta with contrast- 11/13/2017-severe narrowing of the SVC just below the previous stent.  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  It was told to come to the ED if pain became more severe.  Patient otherwise denies shortness of breath or chest pain, no fever or chills, no leg swelling.  He makes no urine.   Recent hospital admission 5/19-5/21/19- for pyelocystitis.  ED Course: Heart rate 104, blood pressure systolic 416/60, platelets low 93, hemoglobin drop 9.7 from 11.2, with macrocytosis- 105.  Glucose low 67. Patient was evaluated by a vascular surgery- Dr. Bridgett Larsson in the ED-who recommended hospitalist admission, will try to add on  patient for central venogram and possible SVC venoplasty tomorrow with Dr. Trula Slade.     Hospital Course:  Active Problems:   ESRD on dialysis [facility])   Chronic hepatitis C without hepatic coma (HCC)   Pancytopenia (HCC)   SVC syndrome   svc syndrome To have central venogram and possible SVC venoplastytoday vascular input appreciated  ESRD on home dialysis (T/W/T/S) Patient does not want renal diet, diet changed to regular diet per patient's request  Anemia of chronic disease He does has macrocytosis He has borderline low folate and low normal b12 Will benefit from b12/folate supplement  HTN, stable on home meds  H/o hepc, treated in 2018  Chronic pain: continue home meds   Code Status: full  Family Communication: patient   Disposition Plan: need vascular surgery clearance   Consultants:  Vascular surgery  Nephrology ( case discussed with nephrology Dr Jonnie Finner)  Procedures:  Venography by vascular today on 6/28  Antibiotics:  none    Discharge Exam: BP (!) 176/80   Pulse 77   Temp 97.9 F (36.6 C) (Oral)   Resp 12   Ht 5\' 4"  (1.626 m)   Wt 51.9 kg (114 lb 6.4 oz)   SpO2 98%   BMI 19.64 kg/m     Discharge Instructions You were cared for by a hospitalist during your hospital stay. If you have any questions about your discharge medications or the care you received while you were in the hospital after you are discharged, you can call the unit and asked to speak  with the hospitalist on call if the hospitalist that took care of you is not available. Once you are discharged, your primary care physician will handle any further medical issues. Please note that NO REFILLS for any discharge medications will be authorized once you are discharged, as it is imperative that you return to your primary care physician (or establish a relationship with a primary care physician if you do not have one) for your aftercare needs so that they can reassess your  need for medications and monitor your lab values.   Allergies as of 11/22/2017      Reactions   Heparin Rash   Pork products    Pork-derived Products Rash   Sulfa Antibiotics Rash      Medication List    TAKE these medications   calcitRIOL 0.5 MCG capsule Commonly known as:  ROCALTROL Take 1.5 mcg by mouth daily.   cinacalcet 30 MG tablet Commonly known as:  SENSIPAR Take 30 mg by mouth daily. Do no take less than 12 hours prior to dialysis   hydrALAZINE 50 MG tablet Commonly known as:  APRESOLINE Take 50 mg by mouth 3 (three) times daily.   labetalol 200 MG tablet Commonly known as:  NORMODYNE Take 200 mg by mouth 2 (two) times daily.   lidocaine-prilocaine cream Commonly known as:  EMLA Apply 1 application topically See admin instructions. APPLY SMALL AMOUNT TO ACCESS SITE (AVF) 1 HOUR BEFORE DIALYSIS. COVER WITH OCCLUSIVE DRESSING (SARAN WRAP)   oxycodone 30 MG immediate release tablet Commonly known as:  ROXICODONE Take 30 mg by mouth 3 (three) times daily.   Oxycodone HCl 10 MG Tabs Take 10 mg by mouth 3 (three) times daily as needed (for pain).   PHOSLO 667 MG capsule Generic drug:  calcium acetate Take 1,334 mg by mouth 3 (three) times daily with meals.      Allergies  Allergen Reactions  . Heparin Rash    Pork products   . Pork-Derived Products Rash  . Sulfa Antibiotics Rash      The results of significant diagnostics from this hospitalization (including imaging, microbiology, ancillary and laboratory) are listed below for reference.    Significant Diagnostic Studies: Ct Angio Chest Aorta W/cm &/or Wo/cm  Result Date: 11/13/2017 CLINICAL DATA:  Superior vena cava thrombus EXAM: CT ANGIOGRAPHY CHEST WITH CONTRAST TECHNIQUE: Multidetector CT imaging of the chest was performed using the standard protocol during bolus administration of intravenous contrast. Multiplanar CT image reconstructions and MIPs were obtained to evaluate the vascular anatomy.  CONTRAST:  162mL ISOVUE-370 IOPAMIDOL (ISOVUE-370) INJECTION 76% COMPARISON:  01/04/2017 FINDINGS: Cardiovascular: There is a venous stent extending from the right subclavian vein to the SVC at the bifurcation of the bilateral innominate veins. There is a small amount of new intimal hyperplasia or chronic thrombus within the stented portion of the right subclavian vein, but this does not cause significant narrowing. The remainder of the stented innominate vein is patent. Just beyond the stent, there is severe narrowing of the superior vena cava see image 59 of series 9, sagittal reconstruction imaging. Injection was performed in the right upper extremity. Filling of the SVC primarily occurs through collateral flow and the azygos system. The left innominate vein is also patent to its insertion in the SVC. I suspect that there is significant narrowing at its insertion. Below the azygos vein, the SVC is widely patent. The heart is markedly enlarged with left ventricular muscular hypertrophy. There is no evidence of aortic dissection. There is no  obvious intramural hematoma. There is no evidence of aortic aneurysm. Minimal coronary artery calcifications. Great vessels are patent bilaterally within the confines of the examination. There is a small saccular aneurysm in the right subclavian artery measuring 1.1 cm. See image 17 of series 5. In retrospect, this abnormality was probably present and has not significantly changed. No obvious acute pulmonary thromboembolism. Mediastinum/Nodes: No abnormal mediastinal adenopathy. There is fluid stranding throughout the mediastinum likely related to a volume overload state. Lungs/Pleura: No pneumothorax. Small pleural effusions and dependent atelectasis. Upper Abdomen: There are cysts in the liver and kidneys Musculoskeletal: No vertebral compression deformity. 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 Review of the MIP images confirms the above findings.  IMPRESSION: The stented portions of the right subclavian and innominate veins are patent. There is severe narrowing of the superior vena cava just below the stent as described above. There is collateral filling of the azygos system from a right upper extremity injection. Correlate with a history of right upper extremity swelling and the need for repeat venogram. 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 Electronically Signed   By: Marybelle Killings M.D.   On: 11/13/2017 13:34    Microbiology: No results found for this or any previous visit (from the past 240 hour(s)).   Labs: Basic Metabolic Panel: Recent Labs  Lab 11/21/17 1705  NA 141  K 4.2  CL 104  CO2 26  GLUCOSE 67*  BUN 35*  CREATININE 8.97*  CALCIUM 8.7*   Liver Function Tests: No results for input(s): AST, ALT, ALKPHOS, BILITOT, PROT, ALBUMIN in the last 168 hours. No results for input(s): LIPASE, AMYLASE in the last 168 hours. No results for input(s): AMMONIA in the last 168 hours. CBC: Recent Labs  Lab 11/21/17 1705 11/22/17 0615  WBC 3.8* 3.8*  NEUTROABS 1.9  --   HGB 9.7* 9.7*  HCT 32.2* 31.5*  MCV 105.6* 103.6*  PLT 93* 99*   Cardiac Enzymes: No results for input(s): CKTOTAL, CKMB, CKMBINDEX, TROPONINI in the last 168 hours. BNP: BNP (last 3 results) No results for input(s): BNP in the last 8760 hours.  ProBNP (last 3 results) No results for input(s): PROBNP in the last 8760 hours.  CBG: Recent Labs  Lab 11/21/17 2023  GLUCAP 120*       Signed:  Florencia Reasons MD, PhD  [facility] 11/22/2017, 7:08 PM

## 2017-11-22 NOTE — Progress Notes (Signed)
Patient off floor to cath lab.  

## 2017-11-23 NOTE — Op Note (Addendum)
    Patient name: Logan Martin MRN: [id number] DOB: 1956/05/08 Sex: male  11/22/2017 Pre-operative Diagnosis: Superior vena cava syndrome Post-operative diagnosis:  Same Surgeon:  Annamarie Major Procedure Performed:  1.  Ultrasound-guided access, right upper arm fistula  2.  Fistulogram  3.  Venoplasty (central vein)     Indications: Patient has a history of superior vena cava syndrome.  He has previously undergone angioplasty of the innominate vein.  He has developed recurrent symptoms.  He had a CT scan that suggested central venous stenosis.  He comes in today for further evaluation  Procedure:  The patient was identified in the holding area and taken to room 8.  The patient was then placed supine on the table and prepped and draped in the usual sterile fashion.  A time out was called.  Ultrasound was used to evaluate the fistula.  The vein was patent and compressible.  A digital ultrasound image was acquired.  The fistula was then accessed under ultrasound guidance using a micropuncture needle.  An 018 wire was then asvanced without resistance and a micropuncture sheath was placed.  Contrast injections were then performed through the sheath.  Findings: Patient has a tortuous right upper arm fistula without stenosis.  There is a stent within the subclavian and right innominate vein.  The stent has a stenosis at the proximal and distal aspects.  Distally in the innominate vein it measures approximately 70-80%.  Proximally is a about 60%.   Intervention: After the above images were acquired the decision was made to proceed with intervention.  A Bentson wire was navigated into the central venous system and a 7 French sheath was placed.  I selected a 12 x 40 Mustang balloon and performed balloon angioplasty of the overlapping stents.  Approximately in the axillary vein as well as distally into the superior vena cava completion imaging showed improved but suboptimal results and therefore I upsized  to a 14 x 40 Atlas balloon.  This was used to perform balloon venoplasty within the distal aspect of the stent extending into the superior vena cava.  After venoplasty, the stenosis within the stent distally was less than 15%.  In the innominate/superior vena cava the stent had decreased to a roughly 40% stenosis.  I did not want to upsized the balloon any further as it would require changing sheaths and I had achieved similar results to what Dr. fields had done a year and a half ago.  I was satisfied with these results.  Catheters and wires were removed.  The sheath was removed and a suture was used to close the cannulation site.  There were no immediate complications  Impression:  #1 2 areas of stenosis within the right sided venous stents.  The most central stenosis was at the edge of the stent extending into the superior vena cava.  It was about 80% which decreased to less than 40% using a Atlas 1440 balloon.  The stenosis at the edge of the stent closest to the arm had a stenosis less than 20% after intervention.    Theotis Burrow, M.D. [facility] Office: [phone] Pager:  [phone]

## 2017-11-25 ENCOUNTER — Encounter (HOSPITAL_COMMUNITY): Payer: Self-pay | Admitting: Surgery

## 2017-11-26 ENCOUNTER — Ambulatory Visit: Payer: Medicare Other | Admitting: Vascular Surgery

## 2018-01-03 DIAGNOSIS — Z4932 Encounter for adequacy testing for peritoneal dialysis: Secondary | ICD-10-CM | POA: Insufficient documentation

## 2018-03-27 ENCOUNTER — Emergency Department (HOSPITAL_COMMUNITY): Payer: Medicare Other | Admitting: Anesthesiology

## 2018-03-27 ENCOUNTER — Encounter (HOSPITAL_COMMUNITY)
Admission: EM | Disposition: A | Discharge status: Home / Self Care | Payer: Self-pay | Source: Home / Self Care | Attending: Emergency Medicine

## 2018-03-27 ENCOUNTER — Emergency Department (HOSPITAL_COMMUNITY): Payer: Medicare Other

## 2018-03-27 ENCOUNTER — Encounter (HOSPITAL_COMMUNITY): Payer: Self-pay | Admitting: Emergency Medicine

## 2018-03-27 ENCOUNTER — Other Ambulatory Visit: Payer: Self-pay

## 2018-03-27 ENCOUNTER — Observation Stay (HOSPITAL_COMMUNITY)
Admission: EM | Admit: 2018-03-27 | Discharge: 2018-03-28 | Disposition: A | Discharge status: Home / Self Care | Payer: Medicare Other | Attending: Urology | Admitting: Urology

## 2018-03-27 DIAGNOSIS — I739 Peripheral vascular disease, unspecified: Secondary | ICD-10-CM | POA: Insufficient documentation

## 2018-03-27 DIAGNOSIS — Z955 Presence of coronary angioplasty implant and graft: Secondary | ICD-10-CM | POA: Diagnosis not present

## 2018-03-27 DIAGNOSIS — D631 Anemia in chronic kidney disease: Secondary | ICD-10-CM | POA: Diagnosis not present

## 2018-03-27 DIAGNOSIS — N3289 Other specified disorders of bladder: Secondary | ICD-10-CM | POA: Diagnosis not present

## 2018-03-27 DIAGNOSIS — Z87891 Personal history of nicotine dependence: Secondary | ICD-10-CM | POA: Diagnosis not present

## 2018-03-27 DIAGNOSIS — Z79891 Long term (current) use of opiate analgesic: Secondary | ICD-10-CM | POA: Insufficient documentation

## 2018-03-27 DIAGNOSIS — Z86711 Personal history of pulmonary embolism: Secondary | ICD-10-CM | POA: Diagnosis not present

## 2018-03-27 DIAGNOSIS — I120 Hypertensive chronic kidney disease with stage 5 chronic kidney disease or end stage renal disease: Secondary | ICD-10-CM | POA: Diagnosis not present

## 2018-03-27 DIAGNOSIS — R319 Hematuria, unspecified: Secondary | ICD-10-CM | POA: Diagnosis not present

## 2018-03-27 DIAGNOSIS — C679 Malignant neoplasm of bladder, unspecified: Secondary | ICD-10-CM | POA: Diagnosis not present

## 2018-03-27 DIAGNOSIS — Z79899 Other long term (current) drug therapy: Secondary | ICD-10-CM | POA: Diagnosis not present

## 2018-03-27 DIAGNOSIS — I2510 Atherosclerotic heart disease of native coronary artery without angina pectoris: Secondary | ICD-10-CM | POA: Diagnosis present

## 2018-03-27 DIAGNOSIS — E876 Hypokalemia: Secondary | ICD-10-CM | POA: Diagnosis not present

## 2018-03-27 DIAGNOSIS — N186 End stage renal disease: Secondary | ICD-10-CM

## 2018-03-27 DIAGNOSIS — R109 Unspecified abdominal pain: Secondary | ICD-10-CM | POA: Diagnosis present

## 2018-03-27 DIAGNOSIS — D494 Neoplasm of unspecified behavior of bladder: Secondary | ICD-10-CM | POA: Diagnosis not present

## 2018-03-27 DIAGNOSIS — Z992 Dependence on renal dialysis: Secondary | ICD-10-CM

## 2018-03-27 DIAGNOSIS — I119 Hypertensive heart disease without heart failure: Secondary | ICD-10-CM | POA: Diagnosis present

## 2018-03-27 DIAGNOSIS — I10 Essential (primary) hypertension: Secondary | ICD-10-CM

## 2018-03-27 DIAGNOSIS — Q613 Polycystic kidney, unspecified: Secondary | ICD-10-CM | POA: Insufficient documentation

## 2018-03-27 DIAGNOSIS — D696 Thrombocytopenia, unspecified: Secondary | ICD-10-CM | POA: Diagnosis not present

## 2018-03-27 HISTORY — PX: CYSTOSCOPY WITH FULGERATION: SHX6638

## 2018-03-27 LAB — COMPREHENSIVE METABOLIC PANEL
ALK PHOS: 77 U/L (ref 38–126)
ALT: 17 U/L (ref 0–44)
ANION GAP: 9 (ref 5–15)
AST: 21 U/L (ref 15–41)
Albumin: 3.7 g/dL (ref 3.5–5.0)
BILIRUBIN TOTAL: 0.8 mg/dL (ref 0.3–1.2)
BUN: 22 mg/dL (ref 8–23)
CALCIUM: 10 mg/dL (ref 8.9–10.3)
CO2: 28 mmol/L (ref 22–32)
CREATININE: 6.6 mg/dL — AB (ref 0.61–1.24)
Chloride: 102 mmol/L (ref 98–111)
GFR, EST AFRICAN AMERICAN: 9 mL/min — AB (ref >60)
GFR, EST NON AFRICAN AMERICAN: 8 mL/min — AB (ref >60)
Glucose, Bld: 103 mg/dL — ABNORMAL HIGH (ref 70–99)
Potassium: 2.8 mmol/L — ABNORMAL LOW (ref 3.5–5.1)
SODIUM: 139 mmol/L (ref 135–145)
TOTAL PROTEIN: 6.9 g/dL (ref 6.5–8.1)

## 2018-03-27 LAB — CBC WITH DIFFERENTIAL/PLATELET
ABS IMMATURE GRANULOCYTES: 0.01 10*3/uL (ref 0.00–0.07)
Basophils Absolute: 0 10*3/uL (ref 0.0–0.1)
Basophils Relative: 1 %
Eosinophils Absolute: 0.3 10*3/uL (ref 0.0–0.5)
Eosinophils Relative: 5 %
HCT: 35.1 % — ABNORMAL LOW (ref 39.0–52.0)
HEMOGLOBIN: 10.7 g/dL — AB (ref 13.0–17.0)
Immature Granulocytes: 0 %
LYMPHS PCT: 33 %
Lymphs Abs: 2.1 10*3/uL (ref 0.7–4.0)
MCH: 29.4 pg (ref 26.0–34.0)
MCHC: 30.5 g/dL (ref 30.0–36.0)
MCV: 96.4 fL (ref 80.0–100.0)
MONO ABS: 0.8 10*3/uL (ref 0.1–1.0)
MONOS PCT: 12 %
Neutro Abs: 3.1 10*3/uL (ref 1.7–7.7)
Neutrophils Relative %: 49 %
Platelets: 76 10*3/uL — ABNORMAL LOW (ref 150–400)
RBC: 3.64 MIL/uL — AB (ref 4.22–5.81)
RDW: 17 % — ABNORMAL HIGH (ref 11.5–15.5)
WBC: 6.3 10*3/uL (ref 4.0–10.5)
nRBC: 0 % (ref 0.0–0.2)

## 2018-03-27 LAB — MAGNESIUM: MAGNESIUM: 1.9 mg/dL (ref 1.7–2.4)

## 2018-03-27 LAB — LIPASE, BLOOD: Lipase: 33 U/L (ref 11–51)

## 2018-03-27 SURGERY — CYSTOSCOPY, WITH BLADDER FULGURATION
Anesthesia: General

## 2018-03-27 MED ORDER — CALCIUM ACETATE (PHOS BINDER) 667 MG PO CAPS
1334.0000 mg | ORAL_CAPSULE | Freq: Three times a day (TID) | ORAL | Status: DC
  Administered 2018-03-28 (×2): 1334 mg via ORAL
  Filled 2018-03-27 (×3): qty 2

## 2018-03-27 MED ORDER — CEFAZOLIN SODIUM-DEXTROSE 1-4 GM/50ML-% IV SOLN
1.0000 g | Freq: Once | INTRAVENOUS | Status: AC
  Administered 2018-03-27: 1 g via INTRAVENOUS
  Filled 2018-03-27: qty 50

## 2018-03-27 MED ORDER — PROMETHAZINE HCL 25 MG/ML IJ SOLN: 6.2500 mg | INTRAMUSCULAR | Status: DC | PRN

## 2018-03-27 MED ORDER — OXYCODONE HCL 5 MG PO TABS: 10.0000 mg | ORAL_TABLET | Freq: Three times a day (TID) | ORAL | Status: DC | PRN

## 2018-03-27 MED ORDER — HYDROXYZINE HCL 10 MG PO TABS
10.0000 mg | ORAL_TABLET | Freq: Three times a day (TID) | ORAL | Status: DC | PRN
  Administered 2018-03-28: 10 mg via ORAL
  Filled 2018-03-27: qty 1

## 2018-03-27 MED ORDER — SODIUM CHLORIDE 0.9 % IV SOLN
INTRAVENOUS | Status: DC | PRN
  Administered 2018-03-27: 50 ug/min via INTRAVENOUS

## 2018-03-27 MED ORDER — OXYCODONE HCL 5 MG PO TABS: 5.0000 mg | ORAL_TABLET | Freq: Once | ORAL | Status: DC | PRN

## 2018-03-27 MED ORDER — SUCCINYLCHOLINE CHLORIDE 20 MG/ML IJ SOLN
INTRAMUSCULAR | Status: DC | PRN
  Administered 2018-03-27: 120 mg via INTRAVENOUS

## 2018-03-27 MED ORDER — HYDRALAZINE HCL 20 MG/ML IJ SOLN: 5.0000 mg | INTRAMUSCULAR | Status: DC | PRN

## 2018-03-27 MED ORDER — SODIUM CHLORIDE 0.9 % IV SOLN
INTRAVENOUS | Status: DC
Start: 2018-03-27 — End: 2018-03-28
  Administered 2018-03-27 – 2018-03-28 (×2): via INTRAVENOUS

## 2018-03-27 MED ORDER — PHENYLEPHRINE HCL 10 MG/ML IJ SOLN
INTRAMUSCULAR | Status: AC
  Filled 2018-03-27: qty 2

## 2018-03-27 MED ORDER — ONDANSETRON HCL 4 MG/2ML IJ SOLN
INTRAMUSCULAR | Status: AC
  Filled 2018-03-27: qty 2

## 2018-03-27 MED ORDER — ACETAMINOPHEN 325 MG PO TABS: 650.0000 mg | ORAL_TABLET | Freq: Four times a day (QID) | ORAL | Status: DC | PRN

## 2018-03-27 MED ORDER — ONDANSETRON HCL 4 MG/2ML IJ SOLN
INTRAMUSCULAR | Status: DC | PRN
  Administered 2018-03-27: 4 mg via INTRAVENOUS

## 2018-03-27 MED ORDER — HYDROMORPHONE HCL 1 MG/ML IJ SOLN: 0.2500 mg | INTRAMUSCULAR | Status: DC | PRN

## 2018-03-27 MED ORDER — PHENYLEPHRINE 40 MCG/ML (10ML) SYRINGE FOR IV PUSH (FOR BLOOD PRESSURE SUPPORT)
PREFILLED_SYRINGE | INTRAVENOUS | Status: AC
  Filled 2018-03-27: qty 10

## 2018-03-27 MED ORDER — EPHEDRINE 5 MG/ML INJ
INTRAVENOUS | Status: AC
  Filled 2018-03-27: qty 10

## 2018-03-27 MED ORDER — OXYBUTYNIN CHLORIDE 5 MG PO TABS
5.0000 mg | ORAL_TABLET | Freq: Four times a day (QID) | ORAL | Status: DC | PRN
  Administered 2018-03-27: 5 mg via ORAL
  Filled 2018-03-27: qty 1

## 2018-03-27 MED ORDER — MORPHINE SULFATE (PF) 4 MG/ML IV SOLN
4.0000 mg | Freq: Once | INTRAVENOUS | Status: AC
Start: 2018-03-27 — End: 2018-03-27
  Administered 2018-03-27: 4 mg via INTRAVENOUS
  Filled 2018-03-27: qty 1

## 2018-03-27 MED ORDER — LABETALOL HCL 200 MG PO TABS
200.0000 mg | ORAL_TABLET | Freq: Once | ORAL | Status: AC
  Administered 2018-03-27: 200 mg via ORAL
  Filled 2018-03-27 (×2): qty 1

## 2018-03-27 MED ORDER — POTASSIUM CHLORIDE 10 MEQ/100ML IV SOLN
10.0000 meq | INTRAVENOUS | Status: AC
  Administered 2018-03-27 (×2): 10 meq via INTRAVENOUS
  Filled 2018-03-27 (×2): qty 100

## 2018-03-27 MED ORDER — HYDRALAZINE HCL 50 MG PO TABS
50.0000 mg | ORAL_TABLET | Freq: Three times a day (TID) | ORAL | Status: DC
  Administered 2018-03-27 – 2018-03-28 (×2): 50 mg via ORAL
  Filled 2018-03-27 (×2): qty 1

## 2018-03-27 MED ORDER — LIDOCAINE-PRILOCAINE 2.5-2.5 % EX CREA
1.0000 "application " | TOPICAL_CREAM | CUTANEOUS | Status: DC
  Filled 2018-03-27: qty 5

## 2018-03-27 MED ORDER — ZOLPIDEM TARTRATE 5 MG PO TABS: 5.0000 mg | ORAL_TABLET | Freq: Every evening | ORAL | Status: DC | PRN

## 2018-03-27 MED ORDER — OXYCODONE HCL 5 MG PO TABS
30.0000 mg | ORAL_TABLET | Freq: Three times a day (TID) | ORAL | Status: DC
  Administered 2018-03-27: 30 mg via ORAL
  Filled 2018-03-27 (×2): qty 6

## 2018-03-27 MED ORDER — OXYCODONE HCL 5 MG/5ML PO SOLN
5.0000 mg | Freq: Once | ORAL | Status: DC | PRN
  Filled 2018-03-27: qty 5

## 2018-03-27 MED ORDER — POTASSIUM CHLORIDE CRYS ER 20 MEQ PO TBCR: 40.0000 meq | EXTENDED_RELEASE_TABLET | Freq: Once | ORAL | Status: DC

## 2018-03-27 MED ORDER — CALCITRIOL 0.5 MCG PO CAPS
1.5000 ug | ORAL_CAPSULE | Freq: Every day | ORAL | Status: DC
  Administered 2018-03-27 – 2018-03-28 (×2): 1.5 ug via ORAL
  Filled 2018-03-27 (×2): qty 3

## 2018-03-27 MED ORDER — ONDANSETRON HCL 4 MG/2ML IJ SOLN
4.0000 mg | Freq: Once | INTRAMUSCULAR | Status: AC
  Administered 2018-03-27: 4 mg via INTRAVENOUS
  Filled 2018-03-27: qty 2

## 2018-03-27 MED ORDER — STERILE WATER FOR IRRIGATION IR SOLN
Status: DC | PRN
  Administered 2018-03-27: 6000 mL

## 2018-03-27 MED ORDER — FENTANYL CITRATE (PF) 100 MCG/2ML IJ SOLN
INTRAMUSCULAR | Status: AC
  Filled 2018-03-27: qty 2

## 2018-03-27 MED ORDER — LIDOCAINE 2% (20 MG/ML) 5 ML SYRINGE
INTRAMUSCULAR | Status: DC | PRN
  Administered 2018-03-27: 50 mg via INTRAVENOUS

## 2018-03-27 MED ORDER — LABETALOL HCL 200 MG PO TABS
200.0000 mg | ORAL_TABLET | Freq: Two times a day (BID) | ORAL | Status: DC
  Administered 2018-03-27 – 2018-03-28 (×2): 200 mg via ORAL
  Filled 2018-03-27 (×2): qty 1

## 2018-03-27 MED ORDER — DEXAMETHASONE SODIUM PHOSPHATE 10 MG/ML IJ SOLN
INTRAMUSCULAR | Status: AC
  Filled 2018-03-27: qty 1

## 2018-03-27 MED ORDER — CEFAZOLIN SODIUM-DEXTROSE 1-4 GM/50ML-% IV SOLN
1.0000 g | INTRAVENOUS | Status: DC
  Filled 2018-03-27: qty 50

## 2018-03-27 MED ORDER — DEXAMETHASONE SODIUM PHOSPHATE 10 MG/ML IJ SOLN
INTRAMUSCULAR | Status: DC | PRN
  Administered 2018-03-27: 10 mg via INTRAVENOUS

## 2018-03-27 MED ORDER — MORPHINE SULFATE (PF) 2 MG/ML IV SOLN: 2.0000 mg | INTRAVENOUS | Status: DC | PRN

## 2018-03-27 MED ORDER — PROPOFOL 10 MG/ML IV BOLUS
INTRAVENOUS | Status: AC
  Filled 2018-03-27: qty 20

## 2018-03-27 MED ORDER — SODIUM CHLORIDE 0.9 % IV SOLN: Freq: Once | INTRAVENOUS | Status: DC

## 2018-03-27 MED ORDER — SODIUM CHLORIDE 0.9 % IR SOLN
Status: DC | PRN
  Administered 2018-03-27: 3000 mL via INTRAVESICAL

## 2018-03-27 MED ORDER — MORPHINE SULFATE (PF) 4 MG/ML IV SOLN
4.0000 mg | Freq: Once | INTRAVENOUS | Status: AC
  Administered 2018-03-27: 4 mg via INTRAVENOUS
  Filled 2018-03-27: qty 1

## 2018-03-27 MED ORDER — 0.9 % SODIUM CHLORIDE (POUR BTL) OPTIME
TOPICAL | Status: DC | PRN
  Administered 2018-03-27: 2000 mL

## 2018-03-27 MED ORDER — FENTANYL CITRATE (PF) 100 MCG/2ML IJ SOLN
INTRAMUSCULAR | Status: DC | PRN
  Administered 2018-03-27: 25 ug via INTRAVENOUS
  Administered 2018-03-27: 50 ug via INTRAVENOUS

## 2018-03-27 MED ORDER — PROPOFOL 10 MG/ML IV BOLUS
INTRAVENOUS | Status: DC | PRN
  Administered 2018-03-27: 150 mg via INTRAVENOUS

## 2018-03-27 MED ORDER — PHENYLEPHRINE 40 MCG/ML (10ML) SYRINGE FOR IV PUSH (FOR BLOOD PRESSURE SUPPORT)
PREFILLED_SYRINGE | INTRAVENOUS | Status: DC | PRN
  Administered 2018-03-27: 120 ug via INTRAVENOUS
  Administered 2018-03-27 (×2): 80 ug via INTRAVENOUS

## 2018-03-27 MED ORDER — POTASSIUM CHLORIDE 20 MEQ/15ML (10%) PO SOLN
40.0000 meq | Freq: Once | ORAL | Status: DC
  Filled 2018-03-27: qty 30

## 2018-03-27 SURGICAL SUPPLY — 21 items
BAG URINE DRAINAGE (UROLOGICAL SUPPLIES) IMPLANT
BAG URO CATCHER STRL LF (MISCELLANEOUS) ×2 IMPLANT
CATH FOLEY 2WAY SLVR 30CC 24FR (CATHETERS) IMPLANT
CATH HEMA 3WAY 30CC 24FR COUDE (CATHETERS) ×2 IMPLANT
COVER WAND RF STERILE (DRAPES) IMPLANT
ELECT REM PT RETURN 15FT ADLT (MISCELLANEOUS) ×2 IMPLANT
EVACUATOR MICROVAS BLADDER (UROLOGICAL SUPPLIES) IMPLANT
GLOVE BIOGEL M 8.0 STRL (GLOVE) ×2 IMPLANT
GOWN STRL REUS W/ TWL XL LVL3 (GOWN DISPOSABLE) IMPLANT
GOWN STRL REUS W/TWL XL LVL3 (GOWN DISPOSABLE) ×2 IMPLANT
KIT BASIN OR (CUSTOM PROCEDURE TRAY) ×2 IMPLANT
LOOP MONOPOLAR YLW (ELECTROSURGICAL) ×4 IMPLANT
MANIFOLD NEPTUNE II (INSTRUMENTS) ×2 IMPLANT
NDL SAFETY ECLIPSE 18X1.5 (NEEDLE) IMPLANT
NEEDLE HYPO 18GX1.5 SHARP (NEEDLE)
PACK CYSTO (CUSTOM PROCEDURE TRAY) ×2 IMPLANT
SET ASPIRATION TUBING (TUBING) IMPLANT
SYRINGE IRR TOOMEY STRL 70CC (SYRINGE) ×2 IMPLANT
TUBING CONNECTING 10 (TUBING) ×2 IMPLANT
TUBING UROLOGY SET (TUBING) IMPLANT
WATER STERILE IRR 3000ML UROMA (IV SOLUTION) IMPLANT

## 2018-03-27 NOTE — Consult Note (Addendum)
Urology Consult Note   Requesting Attending Physician:  Virgel Manifold, MD Service Providing Consult: Urology  Consulting Attending: Franchot Gallo, MD   Reason for Consult:  Gross hematuria, urinary retention  HPI: Logan Martin is seen in consultation for reasons noted above at the request of Virgel Manifold, MD for evaluation of gross hematuria and urinary retention.  This is a 62 y.o. male with a history of bladder augmentation and ESRD on dialysis. He does not make urine at baseline. However, he has a history of multiple episodes pyocytis, most recently in May 2019 requiring OR mucous evacuation.   He presented to the [facility] ED last night with abdominal pain and reported gross hematuria. CT scan demonstrated a very large, distended bladder with heterogeneous material throughout. He is afebrile, HDS without leukocytosis.  ED placed a 20 Fr 3-way catheter and minimal fluid drained. OF note, it was light clear and appeared to have mucous present. He continued to have abdominal pain.    Past Medical History: Past Medical History:  Diagnosis Date  . Alcohol abuse    quit date in 1995  . Anemia   . Chronic low back pain   . Cocaine abuse in remission ([place])    Quit date in 1995  . 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 Started HD in [place], [place].  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  On NxStage home hemo since 2010. Using RUA AVF.    [name] ESRD on dialysis ([place])    "TTS; [place]" (06/14/2016)  . Hepatitis C   . History of blood transfusion 2007 X 1  . History of kidney stones   . Hypertension   . Peripheral vascular disease ([place])   . Pulmonary embolism [facility]) Feb 2012   Treated with coumadin x 1 year  . Renal insufficiency     Past Surgical History:  Past Surgical History:  Procedure Laterality Date  . A/V FISTULAGRAM N/A 11/22/2017   Procedure: A/V Fistulagram;  Surgeon: Serafina Mitchell, MD;  Location: [place]  CV LAB;  Service: Cardiovascular;  Laterality: N/A;  rt. arm   . ABDOMINAL HERNIA REPAIR  ~ 2000 X 2; 2001 X1  . AV FISTULA PLACEMENT Right 2007   upper arm  . AV FISTULA REPAIR Right ~ 03/2016 & 04/2016  . BLADDER AUGMENTATION  1975  . CORONARY ANGIOPLASTY WITH STENT PLACEMENT  1999  . CYSTOSCOPY W/ URETERAL STENT PLACEMENT N/A 10/13/2017   Procedure: CYSTOSCOPY WITH IRRIGATION OF BLADDER PLACEMENT OF FOLEY;  Surgeon: Ceasar Mons, MD;  Location: [place];  Service: Urology;  Laterality: N/A;  . EXTRACORPOREAL SHOCK WAVE LITHOTRIPSY    . HERNIA REPAIR    . INGUINAL HERNIA REPAIR Right 1975  . PERIPHERAL VASCULAR BALLOON ANGIOPLASTY  11/22/2017   Procedure: PERIPHERAL VASCULAR BALLOON ANGIOPLASTY;  Surgeon: Serafina Mitchell, MD;  Location: [place] CV LAB;  Service: Cardiovascular;;  rt. arm fistula  . PERIPHERAL VASCULAR CATHETERIZATION N/A 06/22/2016   Procedure: Left Arm Venography;  Surgeon: Elam Dutch, MD;  Location: [place] CV LAB;  Service: Cardiovascular;  Laterality: N/A;  . PERIPHERAL VASCULAR CATHETERIZATION N/A 06/22/2016   Procedure: A/V Fistulagram - Right Arm;  Surgeon: Elam Dutch, MD;  Location: [place] CV LAB;  Service: Cardiovascular;  Laterality: N/A;  . PERIPHERAL VASCULAR CATHETERIZATION Right 06/22/2016   Procedure: Peripheral Vascular Balloon Angioplasty;  Surgeon: Elam Dutch, MD;  Location: [place] CV LAB;  Service:  Cardiovascular;  Laterality: Right;  arm fistula    Medication: Current Facility-Administered Medications  Medication Dose Route Frequency Provider Last Rate Last Dose  . potassium chloride 10 mEq in 100 mL IVPB  10 mEq Intravenous Q1 Hr x 2 Caccavale, Sophia, PA-C 100 mL/hr at 03/27/18 0727 10 mEq at 03/27/18 6294   Current Outpatient Medications  Medication Sig Dispense Refill  . calcitRIOL (ROCALTROL) 0.5 MCG capsule Take 1.5 mcg by mouth daily.  3  . calcium acetate (PHOSLO) 667 MG capsule Take 1,334 mg by  mouth 3 (three) times daily with meals.     . hydrALAZINE (APRESOLINE) 50 MG tablet Take 50 mg by mouth 3 (three) times daily.  3  . labetalol (NORMODYNE) 200 MG tablet Take 200 mg by mouth 2 (two) times daily.  4  . lidocaine-prilocaine (EMLA) cream Apply 1 application topically See admin instructions. APPLY SMALL AMOUNT TO ACCESS SITE (AVF) 1 HOUR BEFORE DIALYSIS. COVER WITH OCCLUSIVE DRESSING (SARAN WRAP)  6  . oxycodone (ROXICODONE) 30 MG immediate release tablet Take 30 mg by mouth 3 (three) times daily.    . Oxycodone HCl 10 MG TABS Take 10 mg by mouth 3 (three) times daily as needed (for pain).   0    Allergies: Allergies  Allergen Reactions  . Heparin Rash    Pork products   . Pork-Derived Products Rash  . Sulfa Antibiotics Rash    Social History: Social History   Tobacco Use  . Smoking status: Former Smoker    Packs/day: 1.00    Years: 5.00    Pack years: 5.00    Types: Cigarettes    Last attempt to quit: 05/28/1993    Years since quitting: 24.8  . Smokeless tobacco: Never Used  Substance Use Topics  . Alcohol use: No    Comment: 06/14/2016 "nothing since 1995"  . Drug use: No    Types: Marijuana, Cocaine    Comment: 06/14/2016 "nothing since 1995"    Family History Family History  Problem Relation Age of Onset  . Cancer Mother   . Diabetes Mother   . Hypertension Mother   . Deep vein thrombosis Sister   . Deep vein thrombosis Daughter     Review of Systems 10 systems were reviewed and are negative except as noted specifically in the HPI.  Objective   Vital signs in last 24 hours: BP (!) 160/61   Pulse 77   Temp 98.6 F (37 C) (Oral)   Resp 16   Ht 5\' 4"  (1.626 m)   Wt 51.9 kg   SpO2 95%   BMI 19.64 kg/m   Physical Exam General: NAD, A&O, resting, appropriate HEENT: [place]/AT, EOMI, MMM Pulmonary: Normal work of breathing Cardiovascular: HDS, adequate peripheral perfusion Abdomen: Soft, distended, TTP GU: foley catheter with minimal clear  drainage Extremities: warm and well perfused Neuro: Appropriate, no focal neurological deficits  Most Recent Labs: Lab Results  Component Value Date   WBC 6.3 03/27/2018   HGB 10.7 (L) 03/27/2018   HCT 35.1 (L) 03/27/2018   PLT 76 (L) 03/27/2018    Lab Results  Component Value Date   NA 139 03/27/2018   K 2.8 (L) 03/27/2018   CL 102 03/27/2018   CO2 28 03/27/2018   BUN 22 03/27/2018   CREATININE 6.60 (H) 03/27/2018   CALCIUM 10.0 03/27/2018   MG 2.1 11/28/2010   PHOS 4.7 (H) 06/17/2016   PHOS 4.6 06/17/2016    Lab Results  Component Value Date  INR 1.07 11/21/2017   APTT 41 (H) 12/06/2010     IMAGING: Ct Abdomen Pelvis Wo Contrast  Result Date: 03/27/2018 CLINICAL DATA:  62 year old male with abdominal pain. Concern for hernia. EXAM: CT ABDOMEN AND PELVIS WITHOUT CONTRAST TECHNIQUE: Multidetector CT imaging of the abdomen and pelvis was performed following the standard protocol without IV contrast. COMPARISON:  CT of the abdomen pelvis dated 12/11/2016 FINDINGS: Evaluation of this exam is limited in the absence of intravenous contrast. Evaluation is also limited due to diffuse edema and paucity of intra-abdominal fat. Lower chest: The visualized lung bases are clear. There is moderate cardiomegaly. Partially visualized cardiac pacemaker wire. No intra-abdominal free air. No free fluid. Hepatobiliary: Scattered hepatic hypodense lesions measure up to 2.2 cm in the right lobe of the liver. These lesions are not characterized but appears similar to prior CT most likely representing cyst in this patient with history of polycystic kidney disease. The gallbladder is unremarkable. Pancreas: The pancreas is suboptimally visualized. There is mild dilatation of the main pancreatic duct measuring approximately 4 mm similar to prior CT. A 6 mm calcific focus in the head/uncinate process of the pancreas appears similar to prior CT. Spleen: Normal in size without focal abnormality.  Adrenals/Urinary Tract: The adrenal glands are suboptimally visualized. Innumerable bilateral renal cystic lesions as seen previously. No hydronephrosis. Evaluation of the renal lesions is limited on this noncontrast CT. Enlarged and distended urinary bladder containing high attenuating internal content. The bladder measures approximately 14 cm in craniocaudal length. Soft tissue attenuating content within the urinary bladder is not well characterized but may represent bladder mass/neoplasm, chronic thrombus, or inspissated mucous debris. Further evaluation with cystoscopy, if not previously performed, recommended. There is a 9 mm stone along the right posterior bladder wall (series 3, image 55) which is new since the prior CTs. An area of high attenuation along the posterior bladder lumen (series 3, image 50) may represent new hemorrhage or solid mass. Stomach/Bowel: There is a small hiatal hernia. Evaluation of the bowel is limited in the absence of oral contrast. No definite bowel dilatation or evidence of obstruction. Surgical sutures of the cecal region. Vascular/Lymphatic: There is advanced aortoiliac atherosclerotic disease. No portal venous gas. No definite enlarged lymph nodes. Reproductive: Enlarged prostate gland measuring 5 cm in transverse axial diameter. Other: Diffuse subcutaneous edema and anasarca. There is paucity of the subcutaneous fat and cachexia. Probable small fat containing umbilical hernia. No large hernia noted. 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 No acute fracture. IMPRESSION: 1. No large ventral hernia. 2. Enlarged and distended urinary bladder containing high attenuating internal content as before. Further evaluation with cystoscopy, if not previously performed, recommended. 3. A new 9 mm stone along the right posterior bladder wall as well as new areas of high attenuation along the posterior bladder lumen which may represent new hemorrhage  or solid mass. Polycystic kidneys. 4. No bowel dilatation or evidence of obstruction. Electronically Signed   By: Anner Crete M.D.   On: 03/27/2018 05:18    ------  Assessment:  62 y.o. male with a history of ESRD and pyocystis requiring surgical intervention. He presents with retention and concern for recurrent pyocystis with mucous plugging. Discussed management options with patient, including upsizing of catheter vs OR mucous evacuation. Given patient's history and concern that catheter upsizing may not be successful for complete bladder evaucation, he would like to proceed to OR for mucous evacuation.   Last ate at 9:30-10pm last night.    Recommendations: -  Transfer to [facility] for OR with urology. Case was unable to be performed at [facility] due to lack of appropriate equipment previously, requiring transfer and two anesthetics. - OR today for mucous evacuation. Case posted. - Keep patient NPO.     Thank you for this consult. Please contact the urology consult pager with any further questions/concerns.  I have interviewed and examined the pt.  Imaging and laboratory studies were also reviewed. While he feels current Symptoms are from his abdominal hernia/ mesh, CT reveals significantly distended bladder, and this is inadequately drained with the present catheter.  Unfortunately, this is a preventable process and the patient has refused home health irrigation of his bladder.     I agree with Dr. Celesta Aver  assessment and plan, and we will proceed later on today with endoscopic intervention.

## 2018-03-27 NOTE — ED Provider Notes (Signed)
Patient signed out to me by B Rona Ravens, PA-C.  Please see previous notes for further history.  In brief, patient with a history of dialysis, no urine output for 10 years.  Today he developed abdominal pain and noticed gross hematuria.  On CT, patient has a mass with likely hemorrhage/attenuation.  After discussion with urology, recommended for manual irrigation, insertion of Foley, and UA.  Urology requesting call back with results of irrigation (i.e. blood or other). Additionally, pt with hypokalemia. Will given PO and IV potassium and check EKG for changes.   Discussed with Dr. Carlean Purl from urology, who evaluated patient.  Pt with a history of urinary retention requiring OR sedation and washout.   Upon placement of the Foley, little to no drainage. Dr. Carlean Purl recommends transfer to [facility] to go to OR for irrigation. Pt made NPO, oral potassium discontinued. Dr. Beatrix Fetters to be notified when pt arrives to [facility].   Discussed with Dr. Vanita Panda, who is aware pt is arriving to the ED.   Franchot Heidelberg, PA-C 02/17/29 0762    Delora Fuel, MD 26/33/35 2239

## 2018-03-27 NOTE — ED Triage Notes (Signed)
  Patient BIB EMS for abdominal pain that started Tuesday.  Patient states he was trying to pack up his mobility scooter and had abdominal pain afterwards.  Patient states he did not feel immediate pain or feel any pop.  Patient has had hernia repair surgery and has abd mesh inserted.  Patient is a dialysis patient and makes no urine but has been having hematuria since the incident.  Patient states his abdomen is bulging and doesn't look like normal.  Patient was given 260mcg of fentanyl via EMS.  Pain is 6/10.  Patient A&O x4

## 2018-03-27 NOTE — ED Provider Notes (Signed)
Care assumed from [place], who previously took over from [place]. Please see there notes for further details.   Brief this is a 62 year old male with a history of dialysis, no urine output for 10 years who developed abdominal pain and hematuria last night.  On CT he was found to have an enlarged and distended urinary bladder containing high attenuating internal content and a new 9 mm stone along the right posterior bladder as well as possible new hemorrhage or solid mass along the posterior bladder lumen.  Urology was consulted after manual irrigation, insertion of a Foley and UA.  Foley produced no drainage.  Urology recommended transfer to [facility] to go to OR for irrigation.  9:58 AM Patient has arrived at [facility]. He is NAD. Urology at bedside.   11:23 AM Pain medication ordered. Patient placed on cardiac and O2 monitoring. He remains in NAD. NPO till surgery scheduled for 330.   1:58 PM Doing well, developed some nausea. Zofran ordered. Patient to go upstairs at 230.   2:48 PM Patient transferred to OR.    Lorelle Gibbs 03/27/18 1530    Carmin Muskrat, MD 03/29/18 1215

## 2018-03-27 NOTE — ED Notes (Signed)
Bedside commode given to pt

## 2018-03-27 NOTE — Anesthesia Postprocedure Evaluation (Signed)
Anesthesia Post Note  Patient: Logan Martin  Procedure(s) Performed: CYSTOSCOPY WITH FULGERATION AND MUCOUS EVACUATION, BLADDER BIOPSY (N/A )     Patient location during evaluation: PACU Anesthesia Type: General Level of consciousness: awake and alert Pain management: pain level controlled Vital Signs Assessment: post-procedure vital signs reviewed and stable Respiratory status: spontaneous breathing, nonlabored ventilation and respiratory function stable Cardiovascular status: blood pressure returned to baseline and stable Postop Assessment: no apparent nausea or vomiting Anesthetic complications: no    Last Vitals:  Vitals:   03/27/18 1800 03/27/18 1815  BP: (!) 143/65 (!) 147/61  Pulse: 69 71  Resp: 10 12  Temp:    SpO2: 100% 100%    Last Pain:  Vitals:   03/27/18 1815  TempSrc:   PainSc: Asleep                 Lynda Rainwater

## 2018-03-27 NOTE — Op Note (Signed)
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  He has a history of bladder augmentation with an intestinal segment approximately 35 years ago.  The patient has, over the past year, gone into retention with significant bladder distention and urgent cystoscopy and irrigation/evacuation of a large amount of mucoid substance from his bladder.  He presented to the emergency room early this morning with significant bladder distention, abdominal pain, nausea and vomiting.  The patient does not make urine.  He has refused home catheterization/bladder irrigation by home health care.  I discussed urgent care with the patient, specifically cystoscopy, evacuation of the large amount of retained mucus and blood seen on CT scan with him.  I also discussed the fact that if this process continues he could have rupture/perforation of his bladder augmentation with subsequent peritonitis/emergent need for laparotomy.  The patient understands the process of cystoscopy and evacuation of his bladder.  He desires to proceed.  Description of procedure: The patient was properly identified in the holding area.  He received preoperative IV antibiotics.  He was taken to the operating room where general anesthetic was administered.  He is placed in the dorsolithotomy position.  Genitalia and perineum were prepped and draped.  Proper timeout was performed.  69 French panendoscope was advanced into his  bladder under direct vision.  There were no urethral lesions.  Prostate was nonobstructive.  Large amount of material was in the bladder making identification of any significant structures and possible.  I tried evacuating this substance through the 63 [place] scope.  Unfortunately, this was unsuccessful.  This was then replaced with the 28 French resectoscope sheath using the visual obturator.  A large amount of mucoid substance was evacuated from the bladder, this could only be performed using a severe amount of suction using a [place] syringe.  Only 20 to 30 cc of this material would come out at the time.  It was blood-tinged.  Following evacuation of perhaps 500 cc of this substance, the bladder was more easily drained with more liquid fluid.  Significant irrigation was performed with perhaps 3 to 4 L of saline.  Once all visible clot and mucoid material was removed, careful inspection of the entire bladder surface was performed.  It was obvious that there was intestinal mucosa on the dome of the bladder.  For the most part, this appeared normal.  However, at the left bladder neck, it was obvious that there was a polypoid tumorous structure, perhaps 5 cm in size.  This ranged from the midline trigonal region up to the left bladder neck, approximately 2:00.  At this point, I placed the resectoscope element and cutting loop.  This was resected.  There was not a significant amount of bleeding.  Once resection was performed, the resected area was cauterized.  The tumor fragments were then irrigated and sent for pathology.  Inspection of the bladder revealed no significant bleeding.  At this point, the scope was removed.  I placed a 24 French three-way Foley coud tip catheter.  This was hooked to CBI.  The procedure was  then terminated.  Patient tolerated procedure well.  He was awakened and taken to the PACU in stable condition.

## 2018-03-27 NOTE — Anesthesia Procedure Notes (Signed)
Procedure Name: Intubation Date/Time: 03/27/2018 4:04 PM Performed by: West Pugh, CRNA Pre-anesthesia Checklist: Patient identified, Emergency Drugs available, Suction available, Patient being monitored and Timeout performed Patient Re-evaluated:Patient Re-evaluated prior to induction Oxygen Delivery Method: Circle system utilized Preoxygenation: Pre-oxygenation with 100% oxygen Induction Type: IV induction, Rapid sequence and Cricoid Pressure applied Laryngoscope Size: Mac and 4 Grade View: Grade II Tube type: Oral Tube size: 7.5 mm Number of attempts: 1 Airway Equipment and Method: Stylet Placement Confirmation: ETT inserted through vocal cords under direct vision,  positive ETCO2,  CO2 detector and breath sounds checked- equal and bilateral Secured at: 22 cm Tube secured with: Tape Dental Injury: Teeth and Oropharynx as per pre-operative assessment  Comments: Large tongue and immobile epiglottis with grade 2 view.

## 2018-03-27 NOTE — ED Provider Notes (Signed)
[facility] EMERGENCY DEPARTMENT Provider Note   CSN: [id number] Arrival date & time: 03/27/18  0335     History   Chief Complaint Chief Complaint  Patient presents with  . Abdominal Pain  . Hematuria    HPI Logan Martin is a 62 y.o. male.  The history is provided by the patient. No language interpreter was used.  Abdominal Pain   Associated symptoms include hematuria.  Hematuria  Associated symptoms include abdominal pain.     62 year old male with history of end-stage renal disease currently on Tuesday/Thursday/Saturday dialysis, history of kidney stones, chronic back pain, brought here via EMS for evaluation of abdominal pain.  Patient reports since yesterday he has had intermittent diffuse abdominal pain.  He described pain as a crampy sharp pain that is waxing and waning and currently rates it 6 out of 10.  He also noticed that his abdomen is bulging and states that he had prior hernia repair with mesh placement in the past.  Furthermore, even though patient does not make any urine, he has noticed some urinary urges followed by frank blood which is new.  He did recall trying to pick up his mobility scooter earlier yesterday but did not recall any immediate pain without movement.  He does hemodialysis at home.  He did receive 200 mcg of fentanyl via EMS prior to arrival.  Past Medical History:  Diagnosis Date  . Alcohol abuse    quit date in 1995  . Anemia   . Chronic low back pain   . Cocaine abuse in remission ([place])    Quit date in 1995  . 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 Started HD in [place], [place].  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  On NxStage home hemo since 2010. Using RUA AVF.    [name] ESRD on dialysis ([place])    "TTS; [place]" (06/14/2016)  . Hepatitis C   . History of blood transfusion 2007 X 1  . History of kidney stones   . Hypertension   . Peripheral vascular disease ([place])     . Pulmonary embolism [facility]) Feb 2012   Treated with coumadin x 1 year  . Renal insufficiency     Patient Active Problem List   Diagnosis Date Noted  . 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 HTN (hypertension) 10/14/2017  . Pyelocystitis 10/13/2017  . Pancytopenia ([place]) 06/14/2016  . Left arm pain 06/14/2016  . 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 Mechanical complication of other vascular device, implant, and graft 08/10/2013  . End stage renal disease ([place]) 08/10/2013  . ESRD (end stage renal disease) ([place]) 06/07/2013  . Nausea and vomiting 06/07/2013  . SOB (shortness of breath) 05/10/2013  . Chest discomfort 11/27/2012  . Shortness of breath 11/27/2012  . Chronic hepatitis C without hepatic coma ([place]) 02/04/2012  . Secondary hyperparathyroidism (of renal origin) 02/04/2012  . Anemia associated with chronic renal failure 02/04/2012  . CAD (coronary atherosclerotic disease) 02/04/2012  . Pulmonary embolism ([place]) 07/20/2010  . CORONARY ARTERY DISEASE 02/23/2009  . HEMORRHOIDS, INTERNAL 02/23/2009  . GERD 02/23/2009  . ESRD on dialysis [facility]) 02/16/2009    Past Surgical History:  Procedure Laterality Date  . A/V FISTULAGRAM N/A 11/22/2017   Procedure: A/V Fistulagram;  Surgeon: Serafina Mitchell, MD;  Location: [place] CV LAB;  Service: Cardiovascular;  Laterality: N/A;  rt. arm   . ABDOMINAL HERNIA REPAIR  ~ 2000 X 2; 2001  X1  . AV FISTULA PLACEMENT Right 2007   upper arm  . AV FISTULA REPAIR Right ~ 03/2016 & 04/2016  . BLADDER AUGMENTATION  1975  . CORONARY ANGIOPLASTY WITH STENT PLACEMENT  1999  . CYSTOSCOPY W/ URETERAL STENT PLACEMENT N/A 10/13/2017   Procedure: CYSTOSCOPY WITH IRRIGATION OF BLADDER PLACEMENT OF FOLEY;  Surgeon: Ceasar Mons, MD;  Location: [place];  Service: Urology;  Laterality: N/A;  . EXTRACORPOREAL SHOCK WAVE LITHOTRIPSY    . HERNIA REPAIR    . INGUINAL HERNIA REPAIR Right 1975  . PERIPHERAL VASCULAR BALLOON ANGIOPLASTY  11/22/2017    Procedure: PERIPHERAL VASCULAR BALLOON ANGIOPLASTY;  Surgeon: Serafina Mitchell, MD;  Location: [place] CV [facility];  Service: Cardiovascular;;  rt. arm fistula  . PERIPHERAL VASCULAR CATHETERIZATION N/A 06/22/2016   Procedure: Left Arm Venography;  Surgeon: Elam Dutch, MD;  Location: [place] CV LAB;  Service: Cardiovascular;  Laterality: N/A;  . PERIPHERAL VASCULAR CATHETERIZATION N/A 06/22/2016   Procedure: A/V Fistulagram - Right Arm;  Surgeon: Elam Dutch, MD;  Location: [place] [facility];  Service: Cardiovascular;  Laterality: N/A;  . PERIPHERAL VASCULAR CATHETERIZATION Right 06/22/2016   Procedure: Peripheral Vascular Balloon Angioplasty;  Surgeon: Elam Dutch, MD;  Location: [place] [facility];  Service: Cardiovascular;  Laterality: Right;  arm fistula        Home Medications    Prior to Admission medications   Medication Sig Start Date End Date Taking? Authorizing Provider  calcitRIOL (ROCALTROL) 0.5 MCG capsule Take 1.5 mcg by mouth daily. 10/25/17   [provider]  calcium acetate (PHOSLO) 667 MG capsule Take 1,334 mg by mouth 3 (three) times daily with meals.     [provider]  cinacalcet (SENSIPAR) 30 MG tablet Take 30 mg by mouth daily. Do no take less than 12 hours prior to dialysis 10/21/17   [provider]  hydrALAZINE (APRESOLINE) 50 MG tablet Take 50 mg by mouth 3 (three) times daily. 05/11/16   [provider]  labetalol (NORMODYNE) 200 MG tablet Take 200 mg by mouth 2 (two) times daily. 05/11/16   [provider]  lidocaine-prilocaine (EMLA) cream Apply 1 application topically See admin instructions. APPLY SMALL AMOUNT TO ACCESS SITE (AVF) 1 HOUR BEFORE DIALYSIS. COVER WITH OCCLUSIVE DRESSING ([place]AR[place]AP) 11/14/17   [provider]  oxycodone (ROXICODONE) 30 MG immediate release tablet Take 30 mg by mouth 3 (three) times daily.    [provider]  Oxycodone HCl 10 MG TABS Take 10 mg by mouth  3 (three) times daily as needed (for pain).  05/22/16   [provider]    Family History Family History  Problem Relation Age of Onset  . Cancer Mother   . Diabetes Mother   . Hypertension Mother   . Deep vein thrombosis Sister   . Deep vein thrombosis Daughter     Social History Social History   Tobacco Use  . Smoking status: Former Smoker    Packs/day: 1.00    Years: 5.00    Pack years: 5.00    Types: Cigarettes    Last attempt to quit: 05/28/1993    Years since quitting: 24.8  . Smokeless tobacco: Never Used  Substance Use Topics  . Alcohol use: No    Comment: 06/14/2016 "nothing since 1995"  . Drug use: No    Types: Marijuana, Cocaine    Comment: 06/14/2016 "nothing since 1995"     Allergies   Heparin; Pork-derived products; and  Sulfa antibiotics   Review of Systems Review of Systems  Gastrointestinal: Positive for abdominal pain.  Genitourinary: Positive for hematuria.  All other systems reviewed and are negative.    Physical Exam Updated Vital Signs There were no vitals taken for this visit.  Physical Exam  Constitutional: He appears well-developed and well-nourished. No distress.  HENT:  Head: Atraumatic.  Eyes: Conjunctivae are normal.  Neck: Neck supple.  Cardiovascular: Normal rate and regular rhythm.  Murmur heard. Pulmonary/Chest: Effort normal and breath sounds normal.  Abdominal: Soft. Normal appearance. There is generalized tenderness. No hernia. Hernia confirmed negative in the ventral area, confirmed negative in the right inguinal area and confirmed negative in the left inguinal area.  Neurological: He is alert.  Skin: No rash noted.  Psychiatric: He has a normal mood and affect.  Nursing note and vitals reviewed.    ED Treatments / Results  Labs (all labs ordered are listed, but only abnormal results are displayed) Labs Reviewed  CBC WITH DIFFERENTIAL/PLATELET - Abnormal; Notable for the following components:      Result  Value   RBC 3.64 (*)    Hemoglobin 10.7 (*)    HCT 35.1 (*)    RDW 17.0 (*)    Platelets 76 (*)    All other components within normal limits  COMPREHENSIVE METABOLIC PANEL - Abnormal; Notable for the following components:   Potassium 2.8 (*)    Glucose, Bld 103 (*)    Creatinine, Ser 6.60 (*)    GFR calc non Af Amer 8 (*)    GFR calc Af Amer 9 (*)    All other components within normal limits  LIPASE, BLOOD  URINALYSIS, ROUTINE W REFLEX MICROSCOPIC  URINALYSIS, ROUTINE W REFLEX MICROSCOPIC    EKG None  Radiology Ct Abdomen Pelvis Wo Contrast  Result Date: 03/27/2018 CLINICAL DATA:  62 year old male with abdominal pain. Concern for hernia. EXAM: CT ABDOMEN AND PELVIS WITHOUT CONTRAST TECHNIQUE: Multidetector CT imaging of the abdomen and pelvis was performed following the standard protocol without IV contrast. COMPARISON:  CT of the abdomen pelvis dated 12/11/2016 FINDINGS: Evaluation of this exam is limited in the absence of intravenous contrast. Evaluation is also limited due to diffuse edema and paucity of intra-abdominal fat. Lower chest: The visualized lung bases are clear. There is moderate cardiomegaly. Partially visualized cardiac pacemaker wire. No intra-abdominal free air. No free fluid. Hepatobiliary: Scattered hepatic hypodense lesions measure up to 2.2 cm in the right lobe of the liver. These lesions are not characterized but appears similar to prior CT most likely representing cyst in this patient with history of polycystic kidney disease. The gallbladder is unremarkable. Pancreas: The pancreas is suboptimally visualized. There is mild dilatation of the main pancreatic duct measuring approximately 4 mm similar to prior CT. A 6 mm calcific focus in the head/uncinate process of the pancreas appears similar to prior CT. Spleen: Normal in size without focal abnormality. Adrenals/Urinary Tract: The adrenal glands are suboptimally visualized. Innumerable bilateral renal cystic lesions  as seen previously. No hydronephrosis. Evaluation of the renal lesions is limited on this noncontrast CT. Enlarged and distended urinary bladder containing high attenuating internal content. The bladder measures approximately 14 cm in craniocaudal length. Soft tissue attenuating content within the urinary bladder is not well characterized but may represent bladder mass/neoplasm, chronic thrombus, or inspissated mucous debris. Further evaluation with cystoscopy, if not previously performed, recommended. There is a 9 mm stone along the right posterior bladder wall (series 3, image 55) which is  new since the prior CTs. An area of high attenuation along the posterior bladder lumen (series 3, image 50) may represent new hemorrhage or solid mass. Stomach/Bowel: There is a small hiatal hernia. Evaluation of the bowel is limited in the absence of oral contrast. No definite bowel dilatation or evidence of obstruction. Surgical sutures of the cecal region. Vascular/Lymphatic: There is advanced aortoiliac atherosclerotic disease. No portal venous gas. No definite enlarged lymph nodes. Reproductive: Enlarged prostate gland measuring 5 cm in transverse axial diameter. Other: Diffuse subcutaneous edema and anasarca. There is paucity of the subcutaneous fat and cachexia. Probable small fat containing umbilical hernia. No large hernia noted. 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 No acute fracture. IMPRESSION: 1. No large ventral hernia. 2. Enlarged and distended urinary bladder containing high attenuating internal content as before. Further evaluation with cystoscopy, if not previously performed, recommended. 3. A new 9 mm stone along the right posterior bladder wall as well as new areas of high attenuation along the posterior bladder lumen which may represent new hemorrhage or solid mass. Polycystic kidneys. 4. No bowel dilatation or evidence of obstruction. Electronically Signed   By:  Anner Crete M.D.   On: 03/27/2018 05:18    Procedures Procedures (including critical care time)  Medications Ordered in ED Medications  potassium chloride 10 mEq in 100 mL IVPB (has no administration in time range)  potassium chloride SA (K-DUR,KLOR-CON) CR tablet 40 mEq (has no administration in time range)  morphine 4 MG/ML injection 4 mg (4 mg Intravenous Given 03/27/18 0411)     Initial Impression / Assessment and Plan / ED Course  I have reviewed the triage vital signs and the nursing notes.  Pertinent labs & imaging results that were available during my care of the patient were reviewed by me and considered in my medical decision making (see chart for details).     BP (!) 188/71   Pulse 77   Temp 98.6 F (37 C) (Oral)   Resp 18   Ht 5\' 4"  (1.626 m)   Wt 51.9 kg   SpO2 92%   BMI 19.64 kg/m    Final Clinical Impressions(s) / ED Diagnoses   Final diagnoses:  None    ED Discharge Orders    None     4:05 AM Patient here with abdominal pain that started shortly after some heavy lifting.  He has known history of ventral hernia status post surgical repair with mesh placement and he noticed that his abdomen has a different shape and appears more bloated.  Furthermore, he also noticed hematuria which is new as patient does not make urine.  He is currently resting comfortably.  He is found to be hypertensive with blood pressure 195/85, room at that recheck.  Plan to obtain labs, UA, CT scan of the abdomen pelvis for further evaluation.  5:30 AM From reviewing prior notes, patient with history of bladder augmentation in the past.  CT scan of abd/pelvis showing enlarged and distended urinary bladder along with a 78mm stone along the R posterior bladder wall as well as new areas of high attenuation along the posterior bladder lumen.  This may represent new hemorrhage or solid mass.   5:52 AM I have consulted oncall urologist who recommend inserting foley, manual  irrigation and obtain UA.  Recommend consulting again once pt have been irrigated.    6:12 AM Pt sign out to oncoming provider who will f/u on bladder irrigation result and will consult urology for further  management. Evidence of hypokalemia with K+ 2.8,w ill provide replenishment and will check EKG.    Domenic Moras, PA-C 99/57/90 0920    Delora Fuel, MD 09/10/91 [phone]

## 2018-03-27 NOTE — ED Notes (Signed)
Urology at bedside.

## 2018-03-27 NOTE — Anesthesia Preprocedure Evaluation (Signed)
Anesthesia Evaluation  Patient identified by MRN, date of birth, ID band Patient awake    Reviewed: Allergy & Precautions, H&P , NPO status , Patient's Chart, lab work & pertinent test results, reviewed documented beta blocker date and time   Airway Mallampati: III  TM Distance: >3 FB Neck ROM: Full    Dental no notable dental hx. (+) Teeth Intact, Dental Advisory Given   Pulmonary neg pulmonary ROS, former smoker,    Pulmonary exam normal breath sounds clear to auscultation       Cardiovascular hypertension, Pt. on medications and Pt. on home beta blockers + Peripheral Vascular Disease   Rhythm:Regular Rate:Normal     Neuro/Psych negative neurological ROS  negative psych ROS   GI/Hepatic negative GI ROS, GERD  ,(+) Hepatitis -, C  Endo/Other  negative endocrine ROS  Renal/GU ESRF and DialysisRenal disease  negative genitourinary   Musculoskeletal   Abdominal   Peds  Hematology negative hematology ROS (+) anemia ,   Anesthesia Other Findings     Reproductive/Obstetrics negative OB ROS                             Anesthesia Physical  Anesthesia Plan  ASA: III and emergent  Anesthesia Plan: General   Post-op Pain Management:    Induction: Intravenous  PONV Risk Score and Plan: 2 and Treatment may vary due to age or medical condition, Ondansetron and Midazolam  Airway Management Planned: LMA  Additional Equipment:   Intra-op Plan:   Post-operative Plan: Extubation in OR  Informed Consent: I have reviewed the patients History and Physical, chart, labs and discussed the procedure including the risks, benefits and alternatives for the proposed anesthesia with the patient or authorized representative who has indicated his/her understanding and acceptance.   Dental advisory given  Plan Discussed with:   Anesthesia Plan Comments:         Anesthesia Quick Evaluation

## 2018-03-27 NOTE — ED Notes (Signed)
PA at bedside.

## 2018-03-27 NOTE — Consult Note (Signed)
Medical Consultation   MOHANAD CARSTEN  JSH:[id number]  DOB: 05-04-1956  DOA: 03/27/2018  PCP: [facility]   Outpatient Specialists:    Requesting physician: Dr.Dahlstedt of Urology  Reason for consultation: Management of chronic medical issues, including hypertension, end-stage renal disease on dialysis, CAD  Chief complaint: Lower abdominal pain and hematuria  History of Present Illness: JORGE RETZ is an 62 y.o. 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  Patient is admitted by urologist, Dr. Diona Fanti.  Patient is s/p of surgery (cystoscopy, clot evacuation, evacuation of large amount of retained mucus, TURBT 4 cm bladder tumor). We are asked to consult.  Patient states that he has been having lower abdominal pain for more than 2 days.  The pain is constant, dull, radiating to perineal area, moderate. It is associated with nausea and vomiting.  He he had nonbloody and non-biliary vomiting several times.  He also has loose stool earlier, which has resolved.  Currently no diarrhea.  Patient denies fever or chills.  He also reports that he noticed bloody urine, normally he does not make any urine.  No dysuria or burning on urination.  Patient denies any chest pain, cough, shortness of breath.  No unilateral weakness.  He states that he is a doing home hemodialysis on Tuesday, Wednesday, Saturday and Sunday.  Last dialysis was on Wednesday.  No any leg edema.  Per Dr. Diona Fanti, pt needs be monitored overnight, and likely to be discharged home tomorrow, therefore dose not need to be transferred to [facility] now.  Pt was found to have WBC 6.3, potassium 2.8, bicarbonate 28, creatinine 6.60, BUN 22, temperature normal, blood pressure 168/71, no tachycardia, no tachypnea,  oxygen saturation 97% on room air.  CT abdomen/pelvis showed: 1. No large ventral hernia. 2. Enlarged and distended urinary bladder containing high attenuating internal content as before. Further evaluation withcystoscopy, if not previously performed, recommended. 3. A new 9 mm stone along the right posterior bladder wall as wellas new areas of high attenuation along the posterior bladder lumenwhich may represent new hemorrhage or solid mass. Polycystic kidneys. 4. No bowel dilatation or evidence of obstruction.   Review of Systems:   General: no fevers, chills, no changes in body weight, no changes in appetite Skin: no rash HEENT: no blurry vision, hearing changes or sore throat Pulm: no dyspnea, coughing, wheezing CV: no chest pain, palpitations, shortness of breath Abd: Has lower abdominal pain, nausea, vomiting.   GU: no dysuria, has hematuria. Ext: no arthralgias, myalgias Neuro: no weakness, numbness, or tingling  Past Medical History: Past Medical History:  Diagnosis Date  . Alcohol abuse    quit date in 1995  . Anemia   . Chronic low back pain   . Cocaine abuse in remission ([place])    Quit date in 1995  . 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 Started HD in [place].  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  On NxStage home hemo since 2010. Using RUA AVF.    [name] ESRD on dialysis ([place])    "TTS; [place]" (06/14/2016)  . Hepatitis C   . History of blood transfusion 2007 X 1  . History of kidney stones   .  Hypertension   . Peripheral vascular disease ([place])   . Pulmonary embolism [facility]) Feb 2012   Treated with coumadin x 1 year  . Renal insufficiency     Past Surgical History: Past Surgical History:  Procedure Laterality Date  . A/V FISTULAGRAM N/A 11/22/2017   Procedure: A/V Fistulagram;  Surgeon: Serafina Mitchell, MD;  Location: [place] [facility];  Service: Cardiovascular;  Laterality: N/A;  rt. arm   .  ABDOMINAL HERNIA REPAIR  ~ 2000 X 2; 2001 X1  . AV FISTULA PLACEMENT Right 2007   upper arm  . AV FISTULA REPAIR Right ~ 03/2016 & 04/2016  . BLADDER AUGMENTATION  1975  . CORONARY ANGIOPLASTY WITH STENT PLACEMENT  1999  . CYSTOSCOPY W/ URETERAL STENT PLACEMENT N/A 10/13/2017   Procedure: CYSTOSCOPY WITH IRRIGATION OF BLADDER PLACEMENT OF FOLEY;  Surgeon: Ceasar Mons, MD;  Location: [place];  Service: Urology;  Laterality: N/A;  . EXTRACORPOREAL SHOCK WAVE LITHOTRIPSY    . HERNIA REPAIR    . INGUINAL HERNIA REPAIR Right 1975  . PERIPHERAL VASCULAR BALLOON ANGIOPLASTY  11/22/2017   Procedure: PERIPHERAL VASCULAR BALLOON ANGIOPLASTY;  Surgeon: Serafina Mitchell, MD;  Location: [place] [facility];  Service: Cardiovascular;;  rt. arm fistula  . PERIPHERAL VASCULAR CATHETERIZATION N/A 06/22/2016   Procedure: Left Arm Venography;  Surgeon: Elam Dutch, MD;  Location: [place] [facility];  Service: Cardiovascular;  Laterality: N/A;  . PERIPHERAL VASCULAR CATHETERIZATION N/A 06/22/2016   Procedure: A/V Fistulagram - Right Arm;  Surgeon: Elam Dutch, MD;  Location: [place] [facility];  Service: Cardiovascular;  Laterality: N/A;  . PERIPHERAL VASCULAR CATHETERIZATION Right 06/22/2016   Procedure: Peripheral Vascular Balloon Angioplasty;  Surgeon: Elam Dutch, MD;  Location: [place] [facility];  Service: Cardiovascular;  Laterality: Right;  arm fistula     Allergies:   Allergies  Allergen Reactions  . Heparin Rash    Pork products   . Pork-Derived Products Rash  . Sulfa Antibiotics Rash     Social History:  reports that he quit smoking about 24 years ago. His smoking use included cigarettes. He has a 5.00 pack-year smoking history. He has never used smokeless tobacco. He reports that he drank alcohol. He reports that he has current or past drug history. Drugs: Marijuana and Cocaine.   Family History: Family History  Problem Relation Age of Onset  . Cancer Mother   .  Diabetes Mother   . Hypertension Mother   . Deep vein thrombosis Sister   . Deep vein thrombosis Daughter    Physical Exam: Vitals:   03/27/18 1930 03/27/18 2000 03/27/18 2037 03/27/18 2102  BP: (!) 168/71 (!) 149/76 (!) 159/61   Pulse: 73 76 72   Resp: 13 14 18    Temp: 98 F (36.7 C)  97.6 F (36.4 C)   TempSrc:   Oral   SpO2: 97% 99%  98%  Weight:      Height:         General: Not in acute distress HEENT: PERRL, EOMI, no scleral icterus, No JVD or bruit Cardiac: S1/S2, RRR, No murmurs, gallops or rubs Pulm: No rales, wheezing, rhonchi or rubs. Abd: Soft, nondistended, has mild tenderness in suprapubic area, no rebound pain, no organomegaly, BS present GU: pt is having continuous irrigation after surgery. Ext: No edema. 2+DP/PT pulse bilaterally Musculoskeletal: No joint deformities, erythema, or stiffness, ROM full Skin: No rashes.  Neuro: Alert and oriented X3, cranial nerves II-XII grossly intact,  muscle strength 5/5 in all extremeties, sensation to light touch intact.  Psych: Patient is not psychotic, no suicidal or hemocidal ideation.  Data reviewed:  I have personally reviewed following labs and imaging studies Labs:  CBC: Recent Labs  Lab 03/27/18 0404  WBC 6.3  NEUTROABS 3.1  HGB 10.7*  HCT 35.1*  MCV 96.4  PLT 76*    Basic Metabolic Panel: Recent Labs  Lab 03/27/18 0404 03/27/18 2055  NA 139  --   K 2.8*  --   CL 102  --   CO2 28  --   GLUCOSE 103*  --   BUN 22  --   CREATININE 6.60*  --   CALCIUM 10.0  --   MG  --  1.9   GFR Estimated Creatinine Clearance: 8.5 mL/min (A) (by C-G formula based on SCr of 6.6 mg/dL (H)). Liver Function Tests: Recent Labs  Lab 03/27/18 0404  AST 21  ALT 17  ALKPHOS 77  BILITOT 0.8  PROT 6.9  ALBUMIN 3.7   Recent Labs  Lab 03/27/18 0404  LIPASE 33   No results for input(s): AMMONIA in the last 168 hours. Coagulation profile No results for input(s): INR, PROTIME in the last 168 hours.  Cardiac  Enzymes: No results for input(s): CKTOTAL, CKMB, CKMBINDEX, TROPONINI in the last 168 hours. BNP: Invalid input(s): POCBNP CBG: No results for input(s): GLUCAP in the last 168 hours. D-Dimer No results for input(s): DDIMER in the last 72 hours. Hgb A1c No results for input(s): HGBA1C in the last 72 hours. Lipid Profile No results for input(s): CHOL, HDL, LDLCALC, TRIG, CHOLHDL, LDLDIRECT in the last 72 hours. Thyroid function studies No results for input(s): TSH, T4TOTAL, T3FREE, THYROIDAB in the last 72 hours.  Invalid input(s): FREET3 Anemia work up No results for input(s): VITAMINB12, FOLATE, FERRITIN, TIBC, IRON, RETICCTPCT in the last 72 hours. Urinalysis    Component Value Date/Time   COLORURINE BROWN (A) 01/15/2017 1747   APPEARANCEUR TURBID (A) 01/15/2017 1747   LABSPEC 1.015 01/15/2017 1747   PHURINE 8.5 (H) 01/15/2017 1747   GLUCOSEU 250 (A) 01/15/2017 1747   HGBUR LARGE (A) 01/15/2017 1747   BILIRUBINUR LARGE (A) 01/15/2017 1747   KETONESUR 15 (A) 01/15/2017 1747   PROTEINUR >300 (A) 01/15/2017 1747   NITRITE POSITIVE (A) 01/15/2017 1747   LEUKOCYTESUR LARGE (A) 01/15/2017 1747     Microbiology No results found for this or any previous visit (from the past 240 hour(s)).     Inpatient Medications:   Scheduled Meds: . calcitRIOL  1.5 mcg Oral Daily  . calcium acetate  1,334 mg Oral TID WC  . hydrALAZINE  50 mg Oral TID  . labetalol  200 mg Oral BID  . lidocaine-prilocaine  1 application Topical See admin instructions  . oxycodone  30 mg Oral TID  . potassium chloride  40 mEq Oral Once   Continuous Infusions: . sodium chloride 10 mL/hr at 03/27/18 1518  .  ceFAZolin (ANCEF) IV       Radiological Exams on Admission: Ct Abdomen Pelvis Wo Contrast  Result Date: 03/27/2018 CLINICAL DATA:  62 year old male with abdominal pain. Concern for hernia. EXAM: CT ABDOMEN AND PELVIS WITHOUT CONTRAST TECHNIQUE: Multidetector CT imaging of the abdomen and pelvis  was performed following the standard protocol without IV contrast. COMPARISON:  CT of the abdomen pelvis dated 12/11/2016 FINDINGS: Evaluation of this exam is limited in the absence of intravenous contrast. Evaluation is also limited due to diffuse edema and paucity  of intra-abdominal fat. Lower chest: The visualized lung bases are clear. There is moderate cardiomegaly. Partially visualized cardiac pacemaker wire. No intra-abdominal free air. No free fluid. Hepatobiliary: Scattered hepatic hypodense lesions measure up to 2.2 cm in the right lobe of the liver. These lesions are not characterized but appears similar to prior CT most likely representing cyst in this patient with history of polycystic kidney disease. The gallbladder is unremarkable. Pancreas: The pancreas is suboptimally visualized. There is mild dilatation of the main pancreatic duct measuring approximately 4 mm similar to prior CT. A 6 mm calcific focus in the head/uncinate process of the pancreas appears similar to prior CT. Spleen: Normal in size without focal abnormality. Adrenals/Urinary Tract: The adrenal glands are suboptimally visualized. Innumerable bilateral renal cystic lesions as seen previously. No hydronephrosis. Evaluation of the renal lesions is limited on this noncontrast CT. Enlarged and distended urinary bladder containing high attenuating internal content. The bladder measures approximately 14 cm in craniocaudal length. Soft tissue attenuating content within the urinary bladder is not well characterized but may represent bladder mass/neoplasm, chronic thrombus, or inspissated mucous debris. Further evaluation with cystoscopy, if not previously performed, recommended. There is a 9 mm stone along the right posterior bladder wall (series 3, image 55) which is new since the prior CTs. An area of high attenuation along the posterior bladder lumen (series 3, image 50) may represent new hemorrhage or solid mass. Stomach/Bowel: There is a  small hiatal hernia. Evaluation of the bowel is limited in the absence of oral contrast. No definite bowel dilatation or evidence of obstruction. Surgical sutures of the cecal region. Vascular/Lymphatic: There is advanced aortoiliac atherosclerotic disease. No portal venous gas. No definite enlarged lymph nodes. Reproductive: Enlarged prostate gland measuring 5 cm in transverse axial diameter. Other: Diffuse subcutaneous edema and anasarca. There is paucity of the subcutaneous fat and cachexia. Probable small fat containing umbilical hernia. No large hernia noted. 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 No acute fracture. IMPRESSION: 1. No large ventral hernia. 2. Enlarged and distended urinary bladder containing high attenuating internal content as before. Further evaluation with cystoscopy, if not previously performed, recommended. 3. A new 9 mm stone along the right posterior bladder wall as well as new areas of high attenuation along the posterior bladder lumen which may represent new hemorrhage or solid mass. Polycystic kidneys. 4. No bowel dilatation or evidence of obstruction. Electronically Signed   By: Anner Crete M.D.   On: 03/27/2018 05:18    Impression/Recommendations Principal Problem:   Hematuria Active Problems:   ESRD on dialysis ([place])   CAD (coronary atherosclerotic disease)   HTN (hypertension)   Hypokalemia   Thrombocytopenia (HCC)   Anemia in ESRD (end-stage renal disease) (HCC)   Bladder tumor    Hematuria and bladder tumor: Patient is s/p of surgery (cystoscopy, clot evacuation, evacuation of large amount of retained mucus, TURBT 4 cm bladder tumor). pt is having continuous irrigation after surgery. Pt tolerated procedure well.  He has mild lower abdominal pain, no fever or chills.  No nausea vomiting or diarrhea.  Hemodynamically stable.  -pain control:  -prn hydroxyzine for nausea -prn oxycodone and morphine for pain -Ancef  IV per primary team -pending pathology  ESRD on dialysis (TWSS): Patient is doing home hemodialysis.  Last dialysis was on Wednesday.  Potassium 2.8, bicarbonate 28, creatinine 6.60, BUN 22.  Patient does not need dialysis today.  Patient is likely going home tomorrow -If patient stay to Saturday, will need to consult  renal for dialysis.  CAD (coronary atherosclerotic disease): no Cp. -on labetalol  HTN:  -Continue home medications: Labetalol, hydralazine -IV hydralazine prn  Hypokalemia: K= 2.8 on admission. - Repleted - Check Mg level  Thrombocytopenia ([place]): This is a chronic issue.  Platelet at 99 on 11/16/2017--> 76 today.  No bleeding tendency.  Unclear etiology, may be related to history of HCV, which was treated. -Follow-up by CBC  Anemia in ESRD (end-stage renal disease) ([place]): Hemoglobin stable, 10.7. -Follow-up with CBC    Thank you for this consultation.  Our [facility] hospitalist team will follow the patient with you.  Time Spent:  22 min  Ivor Costa M.D. [facility] 03/28/2018, 12:02 AM

## 2018-03-27 NOTE — ED Notes (Signed)
[facility] called @ [phone]-per [place],PA-called by Levada Dy

## 2018-03-27 NOTE — Progress Notes (Signed)
PHARMACY NOTE:  ANTIMICROBIAL RENAL DOSAGE ADJUSTMENT  Current antimicrobial regimen includes a mismatch between antimicrobial dosage and estimated renal function.  As per policy approved by the Pharmacy & Therapeutics and Medical Executive Committees, the antimicrobial dosage will be adjusted accordingly.  Current antimicrobial dosage:  Cefazolin 1g IV q8h  Indication: surgical prophylaxis  Renal Function:  Estimated Creatinine Clearance: 8.5 mL/min (A) (by C-G formula based on SCr of 6.6 mg/dL (H)). [x]      On intermittent HD, scheduled: []      On CRRT    Antimicrobial dosage has been changed to:  1g IV q24h  Additional comments:   Thank you for allowing pharmacy to be a part of this patient's care.  Peggyann Juba, PharmD, BCPS Pager: [phone] 03/27/2018 8:48 PM

## 2018-03-27 NOTE — Progress Notes (Signed)
PCP on call was paged because the patient needed an anti-emetic

## 2018-03-27 NOTE — Transfer of Care (Signed)
Immediate Anesthesia Transfer of Care Note  Patient: Logan Martin  Procedure(s) Performed: CYSTOSCOPY WITH FULGERATION AND MUCOUS EVACUATION, BLADDER BIOPSY (N/A )  Patient Location: PACU  Anesthesia Type:General  Level of Consciousness: awake, alert  and patient cooperative  Airway & Oxygen Therapy: Patient Spontanous Breathing and Patient connected to face mask oxygen  Post-op Assessment: Report given to RN and Post -op Vital signs reviewed and stable  Post vital signs: Reviewed and stable  Last Vitals:  Vitals Value Taken Time  BP 168/79 03/27/2018  5:23 PM  Temp    Pulse 76 03/27/2018  5:29 PM  Resp 12 03/27/2018  5:29 PM  SpO2 100 % 03/27/2018  5:29 PM  Vitals shown include unvalidated device data.  Last Pain:  Vitals:   03/27/18 1229  TempSrc:   PainSc: Asleep         Complications: No apparent anesthesia complications

## 2018-03-28 ENCOUNTER — Encounter (HOSPITAL_COMMUNITY): Payer: Self-pay | Admitting: Urology

## 2018-03-28 DIAGNOSIS — C679 Malignant neoplasm of bladder, unspecified: Secondary | ICD-10-CM | POA: Diagnosis not present

## 2018-03-28 LAB — CBC
HEMATOCRIT: 39.5 % (ref 39.0–52.0)
Hemoglobin: 11.8 g/dL — ABNORMAL LOW (ref 13.0–17.0)
MCH: 29.8 pg (ref 26.0–34.0)
MCHC: 29.9 g/dL — AB (ref 30.0–36.0)
MCV: 99.7 fL (ref 80.0–100.0)
PLATELETS: 86 10*3/uL — AB (ref 150–400)
RBC: 3.96 MIL/uL — ABNORMAL LOW (ref 4.22–5.81)
RDW: 17.3 % — ABNORMAL HIGH (ref 11.5–15.5)
WBC: 4.4 10*3/uL (ref 4.0–10.5)
nRBC: 0 % (ref 0.0–0.2)

## 2018-03-28 LAB — URINALYSIS, ROUTINE W REFLEX MICROSCOPIC
BILIRUBIN URINE: NEGATIVE
GLUCOSE, UA: NEGATIVE mg/dL
Hgb urine dipstick: NEGATIVE
KETONES UR: NEGATIVE mg/dL
LEUKOCYTES UA: NEGATIVE
Nitrite: NEGATIVE
PH: 6 (ref 5.0–8.0)
Protein, ur: NEGATIVE mg/dL
SPECIFIC GRAVITY, URINE: 1 — AB (ref 1.005–1.030)

## 2018-03-28 LAB — BASIC METABOLIC PANEL
ANION GAP: 14 (ref 5–15)
BUN: 32 mg/dL — ABNORMAL HIGH (ref 8–23)
CO2: 25 mmol/L (ref 22–32)
CREATININE: 8.83 mg/dL — AB (ref 0.61–1.24)
Calcium: 9.4 mg/dL (ref 8.9–10.3)
Chloride: 101 mmol/L (ref 98–111)
GFR, EST AFRICAN AMERICAN: 7 mL/min — AB (ref >60)
GFR, EST NON AFRICAN AMERICAN: 6 mL/min — AB (ref >60)
GLUCOSE: 121 mg/dL — AB (ref 70–99)
Potassium: 3.8 mmol/L (ref 3.5–5.1)
Sodium: 140 mmol/L (ref 135–145)

## 2018-03-28 MED ORDER — PROMETHAZINE HCL 25 MG PO TABS
12.5000 mg | ORAL_TABLET | Freq: Four times a day (QID) | ORAL | Status: DC | PRN
  Administered 2018-03-28: 12.5 mg via ORAL
  Filled 2018-03-28: qty 1

## 2018-03-28 MED ORDER — CIPROFLOXACIN HCL 500 MG PO TABS: 500.0000 mg | ORAL_TABLET | Freq: Every day | ORAL | 0 refills | Status: AC

## 2018-03-28 MED ORDER — PROMETHAZINE HCL 25 MG/ML IJ SOLN: 6.2500 mg | Freq: Four times a day (QID) | INTRAMUSCULAR | Status: DC | PRN

## 2018-03-28 MED ORDER — PROMETHAZINE HCL 12.5 MG PO TABS: 12.5000 mg | ORAL_TABLET | Freq: Four times a day (QID) | ORAL | 0 refills | Status: DC | PRN

## 2018-03-28 NOTE — Discharge Instructions (Signed)
We will call you with the results of the specimen we took during the procedure  We will also call you to schedule appropriate follow-up appointment  Report fever or any significant changes in your status to your primary care provider, nephrologist, or [facility].

## 2018-03-28 NOTE — Discharge Summary (Addendum)
[facility] Discharge Summary  Admit date: 03/27/2018  Discharge date and time: 03/28/18   Discharge to: Home  Discharge Service: Urology  Discharge Attending Physician:  Dr. 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  Vital signs and laboratory values were stable and within normal limits.  The physical exam was benign and unchanged and all surgical wounds were examined.  Discharge instructions were explained and all questions answered.  Subjective  No acute events overnight. Pain Controlled. No fever or chills.  Objective Patient Vitals for the past 8 hrs:  BP Temp Temp src Pulse Resp SpO2  03/28/18 0454 (!) 151/62 97.8 F (36.6 C) Oral 73 16 100 %   Total I/O In: 240 [P.O.:240] Out: -   General Appearance:        No acute distress Lungs:                       Normal work of breathing on room air Heart:                                Regular rate and rhythm Abdomen:                         Soft, non-tender, non-distended Extremities:                      Warm and well perfused   Hospital Course:  The patient underwent cystoscopy, clot evacuation, mucous evacuation, and TURBT  on 03/27/2018.  The patient tolerated the procedure well, was extubated in the OR, and afterwards was taken to the PACU for routine post-surgical care. When stable  the patient was transferred to the floor.   The patient did well postoperatively. He initially had nausea and vomiting and this resolved with phenergan. The patient's diet was slowly advanced and at the time of discharge was tolerating a regular diet.  The patient was discharged home 1 Day Post-Op, at which point was tolerating a regular solid diet, was able to void spontaneously, have adequate pain control with P.O. pain medication, and could ambulate without difficulty. The patient will follow up with [place] for post op check and review of pathology. He was discharged on renal dosing of ciprofloxacin for a course of 7 days.   Condition at Discharge: Improved   Discharge Medications:  Allergies as of 03/28/2018      Reactions   Heparin Rash   Pork products    Pork-derived Products Rash   Sulfa Antibiotics Rash      Medication List    TAKE these medications   calcitRIOL 0.5 MCG capsule Commonly known as:  ROCALTROL Take 1.5 mcg by mouth daily.   ciprofloxacin 500 MG tablet Commonly known as:  CIPRO Take 1 tablet (500 mg total) by mouth daily for 7  days.   hydrALAZINE 50 MG tablet Commonly known as:  APRESOLINE Take 50 mg by mouth 3 (three) times daily.   labetalol 200 MG tablet Commonly known as:  NORMODYNE Take 200 mg by mouth 2 (two) times daily.   lidocaine-prilocaine cream Commonly known as:  EMLA Apply 1 application topically See admin instructions. APPLY SMALL AMOUNT TO ACCESS SITE (AVF) 1 HOUR BEFORE DIALYSIS. COVER WITH OCCLUSIVE DRESSING (SARAN WRAP)   oxycodone 30 MG immediate release tablet Commonly known as:  ROXICODONE Take 30 mg by mouth 3 (three) times daily.   Oxycodone HCl 10 MG Tabs Take 10 mg by mouth 3 (three) times daily as needed (for pain).   PHOSLO 667 MG capsule Generic drug:  calcium acetate Take 1,334 mg by mouth 3 (three) times daily with meals.   promethazine 12.5 MG tablet Commonly known as:  PHENERGAN Take 1 tablet (12.5 mg total) by mouth  every 6 (six) hours as needed for nausea or vomiting.

## 2018-03-28 NOTE — Progress Notes (Signed)
Received report from Joanne, RN. I agree with previous assessment. Patient with no new complaints. Will continue to monitor closely. 

## 2018-03-28 NOTE — Progress Notes (Signed)
PT was seen by [facility] PA and the discharge instructions were entered. The PA informed the pt that Dr. Beatrix Fetters would come by after to surgery to see the pt. Pt came to nursing station informing RN that he was going to have to leave and was walking off the unit. [place] and PA were paged and no response.  Spoke with Baxter Flattery, charge nurse and informed her of situation. Discharge papers were given to the pt before leaving. Will informed Dr Beatrix Fetters of d/c if he comes to unit.

## 2018-04-30 ENCOUNTER — Other Ambulatory Visit: Payer: Self-pay | Admitting: Urology

## 2018-05-31 ENCOUNTER — Emergency Department (HOSPITAL_COMMUNITY)
Admission: EM | Admit: 2018-05-31 | Discharge: 2018-05-31 | Discharge status: Against Medical Advice | Payer: Medicare Other | Attending: Emergency Medicine | Admitting: Emergency Medicine

## 2018-05-31 ENCOUNTER — Encounter (HOSPITAL_COMMUNITY): Payer: Self-pay

## 2018-05-31 DIAGNOSIS — R531 Weakness: Secondary | ICD-10-CM | POA: Diagnosis present

## 2018-05-31 DIAGNOSIS — Z5321 Procedure and treatment not carried out due to patient leaving prior to being seen by health care provider: Secondary | ICD-10-CM | POA: Diagnosis not present

## 2018-05-31 LAB — BASIC METABOLIC PANEL
ANION GAP: 12 (ref 5–15)
BUN: 44 mg/dL — ABNORMAL HIGH (ref 8–23)
CHLORIDE: 103 mmol/L (ref 98–111)
CO2: 25 mmol/L (ref 22–32)
Calcium: 9.5 mg/dL (ref 8.9–10.3)
Creatinine, Ser: 7.89 mg/dL — ABNORMAL HIGH (ref 0.61–1.24)
GFR, EST AFRICAN AMERICAN: 8 mL/min — AB (ref >60)
GFR, EST NON AFRICAN AMERICAN: 7 mL/min — AB (ref >60)
GLUCOSE: 90 mg/dL (ref 70–99)
POTASSIUM: 3.9 mmol/L (ref 3.5–5.1)
Sodium: 140 mmol/L (ref 135–145)

## 2018-05-31 LAB — CBC
HEMATOCRIT: 28.1 % — AB (ref 39.0–52.0)
Hemoglobin: 8.6 g/dL — ABNORMAL LOW (ref 13.0–17.0)
MCH: 31 pg (ref 26.0–34.0)
MCHC: 30.6 g/dL (ref 30.0–36.0)
MCV: 101.4 fL — ABNORMAL HIGH (ref 80.0–100.0)
PLATELETS: 83 10*3/uL — AB (ref 150–400)
RBC: 2.77 MIL/uL — AB (ref 4.22–5.81)
RDW: 18.5 % — ABNORMAL HIGH (ref 11.5–15.5)
WBC: 5.3 10*3/uL (ref 4.0–10.5)
nRBC: 0 % (ref 0.0–0.2)

## 2018-05-31 NOTE — ED Notes (Signed)
NO ANSWER IN LOBBY X 1

## 2018-05-31 NOTE — ED Triage Notes (Signed)
Pt reports weakness since dx with bladder cancer in November 2019, in the past week weakness has gotten severe.  NO c/o pain.  Endorses loose stools.

## 2018-05-31 NOTE — ED Notes (Signed)
NO ANSWER X 2.

## 2018-05-31 NOTE — ED Notes (Signed)
No answer x3

## 2018-06-17 ENCOUNTER — Emergency Department (HOSPITAL_COMMUNITY): Payer: Medicare Other

## 2018-06-17 ENCOUNTER — Other Ambulatory Visit: Payer: Self-pay

## 2018-06-17 ENCOUNTER — Inpatient Hospital Stay (HOSPITAL_COMMUNITY)
Admission: EM | Admit: 2018-06-17 | Discharge: 2018-06-18 | DRG: 640 | Disposition: A | Discharge status: Home / Self Care | Payer: Medicare Other | Attending: Internal Medicine | Admitting: Internal Medicine

## 2018-06-17 ENCOUNTER — Encounter (HOSPITAL_COMMUNITY): Payer: Self-pay | Admitting: *Deleted

## 2018-06-17 DIAGNOSIS — F1411 Cocaine abuse, in remission: Secondary | ICD-10-CM | POA: Diagnosis not present

## 2018-06-17 DIAGNOSIS — R7989 Other specified abnormal findings of blood chemistry: Secondary | ICD-10-CM | POA: Diagnosis not present

## 2018-06-17 DIAGNOSIS — E8889 Other specified metabolic disorders: Secondary | ICD-10-CM | POA: Diagnosis present

## 2018-06-17 DIAGNOSIS — Z79899 Other long term (current) drug therapy: Secondary | ICD-10-CM | POA: Diagnosis not present

## 2018-06-17 DIAGNOSIS — Z882 Allergy status to sulfonamides status: Secondary | ICD-10-CM

## 2018-06-17 DIAGNOSIS — Z955 Presence of coronary angioplasty implant and graft: Secondary | ICD-10-CM | POA: Diagnosis not present

## 2018-06-17 DIAGNOSIS — I248 Other forms of acute ischemic heart disease: Secondary | ICD-10-CM | POA: Diagnosis not present

## 2018-06-17 DIAGNOSIS — B1920 Unspecified viral hepatitis C without hepatic coma: Secondary | ICD-10-CM | POA: Diagnosis present

## 2018-06-17 DIAGNOSIS — N186 End stage renal disease: Secondary | ICD-10-CM | POA: Diagnosis not present

## 2018-06-17 DIAGNOSIS — R0602 Shortness of breath: Secondary | ICD-10-CM | POA: Diagnosis present

## 2018-06-17 DIAGNOSIS — Z86711 Personal history of pulmonary embolism: Secondary | ICD-10-CM

## 2018-06-17 DIAGNOSIS — F1011 Alcohol abuse, in remission: Secondary | ICD-10-CM | POA: Diagnosis present

## 2018-06-17 DIAGNOSIS — I10 Essential (primary) hypertension: Secondary | ICD-10-CM

## 2018-06-17 DIAGNOSIS — Z992 Dependence on renal dialysis: Secondary | ICD-10-CM | POA: Diagnosis not present

## 2018-06-17 DIAGNOSIS — I120 Hypertensive chronic kidney disease with stage 5 chronic kidney disease or end stage renal disease: Secondary | ICD-10-CM | POA: Diagnosis present

## 2018-06-17 DIAGNOSIS — I2510 Atherosclerotic heart disease of native coronary artery without angina pectoris: Secondary | ICD-10-CM | POA: Diagnosis not present

## 2018-06-17 DIAGNOSIS — D696 Thrombocytopenia, unspecified: Secondary | ICD-10-CM | POA: Diagnosis present

## 2018-06-17 DIAGNOSIS — N2581 Secondary hyperparathyroidism of renal origin: Secondary | ICD-10-CM | POA: Diagnosis not present

## 2018-06-17 DIAGNOSIS — Z888 Allergy status to other drugs, medicaments and biological substances status: Secondary | ICD-10-CM | POA: Diagnosis not present

## 2018-06-17 DIAGNOSIS — Z87891 Personal history of nicotine dependence: Secondary | ICD-10-CM

## 2018-06-17 DIAGNOSIS — Z8551 Personal history of malignant neoplasm of bladder: Secondary | ICD-10-CM

## 2018-06-17 DIAGNOSIS — R778 Other specified abnormalities of plasma proteins: Secondary | ICD-10-CM

## 2018-06-17 DIAGNOSIS — I739 Peripheral vascular disease, unspecified: Secondary | ICD-10-CM | POA: Diagnosis present

## 2018-06-17 DIAGNOSIS — E8770 Fluid overload, unspecified: Principal | ICD-10-CM | POA: Diagnosis present

## 2018-06-17 DIAGNOSIS — I119 Hypertensive heart disease without heart failure: Secondary | ICD-10-CM | POA: Diagnosis present

## 2018-06-17 DIAGNOSIS — D631 Anemia in chronic kidney disease: Secondary | ICD-10-CM | POA: Diagnosis present

## 2018-06-17 LAB — CBC
HCT: 32.3 % — ABNORMAL LOW (ref 39.0–52.0)
Hemoglobin: 9.6 g/dL — ABNORMAL LOW (ref 13.0–17.0)
MCH: 34.5 pg — ABNORMAL HIGH (ref 26.0–34.0)
MCHC: 29.7 g/dL — ABNORMAL LOW (ref 30.0–36.0)
MCV: 116.2 fL — ABNORMAL HIGH (ref 80.0–100.0)
Platelets: 96 10*3/uL — ABNORMAL LOW (ref 150–400)
RBC: 2.78 MIL/uL — ABNORMAL LOW (ref 4.22–5.81)
RDW: 21.1 % — ABNORMAL HIGH (ref 11.5–15.5)
WBC: 4.9 10*3/uL (ref 4.0–10.5)
nRBC: 0.6 % — ABNORMAL HIGH (ref 0.0–0.2)

## 2018-06-17 LAB — BASIC METABOLIC PANEL
Anion gap: 13 (ref 5–15)
BUN: 50 mg/dL — AB (ref 8–23)
CO2: 25 mmol/L (ref 22–32)
Calcium: 9.5 mg/dL (ref 8.9–10.3)
Chloride: 103 mmol/L (ref 98–111)
Creatinine, Ser: 7.9 mg/dL — ABNORMAL HIGH (ref 0.61–1.24)
GFR calc Af Amer: 8 mL/min — ABNORMAL LOW (ref >60)
GFR calc non Af Amer: 7 mL/min — ABNORMAL LOW (ref >60)
Glucose, Bld: 98 mg/dL (ref 70–99)
Potassium: 4 mmol/L (ref 3.5–5.1)
Sodium: 141 mmol/L (ref 135–145)

## 2018-06-17 LAB — TROPONIN I: Troponin I: 0.24 ng/mL (ref <0.03)

## 2018-06-17 LAB — CBG MONITORING, ED: Glucose-Capillary: 90 mg/dL (ref 70–99)

## 2018-06-17 MED ORDER — LEVALBUTEROL HCL 1.25 MG/0.5ML IN NEBU
1.2500 mg | INHALATION_SOLUTION | Freq: Four times a day (QID) | RESPIRATORY_TRACT | Status: DC
  Administered 2018-06-18: 1.25 mg via RESPIRATORY_TRACT
  Filled 2018-06-17: qty 0.5

## 2018-06-17 MED ORDER — CALCITRIOL 0.5 MCG PO CAPS
1.5000 ug | ORAL_CAPSULE | Freq: Every day | ORAL | Status: DC
  Administered 2018-06-18: 1.5 ug via ORAL
  Filled 2018-06-17: qty 3

## 2018-06-17 MED ORDER — HYDRALAZINE HCL 20 MG/ML IJ SOLN: 5.0000 mg | INTRAMUSCULAR | Status: DC | PRN

## 2018-06-17 MED ORDER — NITROGLYCERIN 0.4 MG SL SUBL: 0.4000 mg | SUBLINGUAL_TABLET | SUBLINGUAL | Status: DC | PRN

## 2018-06-17 MED ORDER — LIDOCAINE-PRILOCAINE 2.5-2.5 % EX CREA
1.0000 "application " | TOPICAL_CREAM | CUTANEOUS | Status: DC | PRN
  Filled 2018-06-17: qty 5

## 2018-06-17 MED ORDER — OXYCODONE HCL 5 MG PO TABS: 10.0000 mg | ORAL_TABLET | Freq: Three times a day (TID) | ORAL | Status: DC | PRN

## 2018-06-17 MED ORDER — CALCIUM ACETATE (PHOS BINDER) 667 MG PO CAPS
1334.0000 mg | ORAL_CAPSULE | Freq: Three times a day (TID) | ORAL | Status: DC
  Administered 2018-06-18 (×2): 1334 mg via ORAL
  Filled 2018-06-17 (×2): qty 2

## 2018-06-17 MED ORDER — SODIUM CHLORIDE (PF) 0.9 % IJ SOLN
INTRAMUSCULAR | Status: AC
  Filled 2018-06-17: qty 50

## 2018-06-17 MED ORDER — IOPAMIDOL (ISOVUE-370) INJECTION 76%
100.0000 mL | Freq: Once | INTRAVENOUS | Status: AC | PRN
  Administered 2018-06-17: 100 mL via INTRAVENOUS

## 2018-06-17 MED ORDER — ACETAMINOPHEN 325 MG PO TABS: 650.0000 mg | ORAL_TABLET | Freq: Four times a day (QID) | ORAL | Status: DC | PRN

## 2018-06-17 MED ORDER — ONDANSETRON HCL 4 MG PO TABS: 4.0000 mg | ORAL_TABLET | Freq: Four times a day (QID) | ORAL | Status: DC | PRN

## 2018-06-17 MED ORDER — SODIUM CHLORIDE 0.9% FLUSH: 3.0000 mL | Freq: Once | INTRAVENOUS | Status: DC

## 2018-06-17 MED ORDER — ASPIRIN EC 81 MG PO TBEC
81.0000 mg | DELAYED_RELEASE_TABLET | Freq: Every day | ORAL | Status: DC
  Administered 2018-06-18 (×2): 81 mg via ORAL
  Filled 2018-06-17 (×2): qty 1

## 2018-06-17 MED ORDER — ACETAMINOPHEN 650 MG RE SUPP: 650.0000 mg | Freq: Four times a day (QID) | RECTAL | Status: DC | PRN

## 2018-06-17 MED ORDER — HYDRALAZINE HCL 50 MG PO TABS
50.0000 mg | ORAL_TABLET | Freq: Three times a day (TID) | ORAL | Status: DC
  Administered 2018-06-17 – 2018-06-18 (×2): 50 mg via ORAL
  Filled 2018-06-17 (×3): qty 1

## 2018-06-17 MED ORDER — ADULT MULTIVITAMIN LIQUID CH
15.0000 mL | Freq: Every day | ORAL | Status: DC
  Administered 2018-06-18: 15 mL via ORAL
  Filled 2018-06-17: qty 15

## 2018-06-17 MED ORDER — LABETALOL HCL 200 MG PO TABS
200.0000 mg | ORAL_TABLET | Freq: Two times a day (BID) | ORAL | Status: DC
  Administered 2018-06-17 – 2018-06-18 (×2): 200 mg via ORAL
  Filled 2018-06-17 (×2): qty 1

## 2018-06-17 MED ORDER — MORPHINE SULFATE (PF) 2 MG/ML IV SOLN: 2.0000 mg | INTRAVENOUS | Status: DC | PRN

## 2018-06-17 MED ORDER — DM-GUAIFENESIN ER 30-600 MG PO TB12
1.0000 | ORAL_TABLET | Freq: Two times a day (BID) | ORAL | Status: DC | PRN
  Filled 2018-06-17: qty 1

## 2018-06-17 MED ORDER — SENNOSIDES-DOCUSATE SODIUM 8.6-50 MG PO TABS: 1.0000 | ORAL_TABLET | Freq: Every evening | ORAL | Status: DC | PRN

## 2018-06-17 MED ORDER — HYDROXYZINE HCL 10 MG PO TABS: 10.0000 mg | ORAL_TABLET | Freq: Three times a day (TID) | ORAL | Status: DC | PRN

## 2018-06-17 MED ORDER — ONDANSETRON HCL 4 MG/2ML IJ SOLN: 4.0000 mg | Freq: Four times a day (QID) | INTRAMUSCULAR | Status: DC | PRN

## 2018-06-17 MED ORDER — IOPAMIDOL (ISOVUE-370) INJECTION 76%
INTRAVENOUS | Status: AC
  Filled 2018-06-17: qty 100

## 2018-06-17 MED ORDER — ZOLPIDEM TARTRATE 5 MG PO TABS: 5.0000 mg | ORAL_TABLET | Freq: Every evening | ORAL | Status: DC | PRN

## 2018-06-17 NOTE — ED Triage Notes (Addendum)
Per EMS pt was seen by his PCP today and was told to come to ER for possible anemia. Per EMS pt c/o generalized weakness, and abdominal pain. Pt with hx of bladder cancer, he is on home dialysis--due today. Pt reports feeling extremely short of breath for the last 3 days, and his nephrologist told him he is probably very anemic.

## 2018-06-17 NOTE — H&P (Addendum)
History and Physical    Logan Martin MEQ:[id number] DOB: 1956-04-15 DOA: 06/17/2018  Referring MD/NP/PA:   PCP: [facility]   Patient coming from:  The patient is coming from home.  At baseline, pt is independent for most of ADL.        Chief Complaint: SOB  HPI: Logan Martin is a 63 y.o. male with medical history significant of ESRD-HD (MTTF), hypertension, CAD, thrombocytopenia, alcohol abuse in remission, cocaine abuse in remission, HCV, PVD, PE, bladder cancer, who presents with shortness of breath.  Patient states that he has been having shortness of breath for more than 3 days, which has been progressively getting worse, particularly on exertion.  Patient denies any chest pain.  He has mild dry cough, but no fever or chills.  No tenderness in calf areas.  Patient does not have nausea, vomiting, diarrhea, abdominal pain, symptoms of UTI or unilateral weakness.  Patient states that he had dialysis on Monday, and had 1 L of fluid removed.  ED Course: pt was found to have potassium 4.0, trop 0.24, bicarbonate 25, creatinine 7.90, BUN 50, WBC 4.9, hemoglobin 9.6 which was 8.6 on 06/01/2019, thrombocytopenia with platelets of 96.  CT angiogram of chest is negative for PE, but showed evidence of volume overload.  Patient is placed on telemetry bed for observation.  Nephrologist, Dr. Marval Regal was consulted by EDP.   Review of Systems:   General: no fevers, chills, has fatigue HEENT: no blurry vision, hearing changes or sore throat Respiratory: has dyspnea, coughing, no wheezing CV: no chest pain, no palpitations GI: no nausea, vomiting, abdominal pain, diarrhea, constipation GU: no dysuria, burning on urination, increased urinary frequency, hematuria  Ext: has leg edema Neuro: no unilateral weakness, numbness, or tingling, no vision change or hearing loss Skin: no rash, no skin tear. MSK: No muscle spasm, no deformity, no limitation of range of movement in  spin Heme: No easy bruising.  Travel history: No recent long distant travel.  Allergy:  Allergies  Allergen Reactions  . Heparin Rash    Pork products   . Pork-Derived Products Rash  . Sulfa Antibiotics Rash    Past Medical History:  Diagnosis Date  . Alcohol abuse    quit date in 1995  . Anemia   . Chronic low back pain   . Cocaine abuse in remission ([place])    Quit date in 1995  . 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 Started HD in [place], [place].  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  On [facility] home hemo since 2010. Using RUA AVF.    [name] ESRD on dialysis ([place])    "TTS; [place]" (06/14/2016)  . Hepatitis C   . History of blood transfusion 2007 X 1  . History of kidney stones   . Hypertension   . Peripheral vascular disease ([place])   . Pulmonary embolism [facility]) Feb 2012   Treated with coumadin x 1 year  . Renal insufficiency     Past Surgical History:  Procedure Laterality Date  . A/V FISTULAGRAM N/A 11/22/2017   Procedure: A/V Fistulagram;  Surgeon: Serafina Mitchell, MD;  Location: [place] [facility];  Service: Cardiovascular;  Laterality: N/A;  rt. arm   . ABDOMINAL HERNIA REPAIR  ~ 2000 X 2; 2001 X1  . AV FISTULA PLACEMENT Right 2007   upper arm  . AV FISTULA REPAIR Right ~ 03/2016 & 04/2016  . BLADDER AUGMENTATION  [place]  . CYSTOSCOPY W/ URETERAL STENT PLACEMENT N/A 10/13/2017   Procedure: CYSTOSCOPY WITH IRRIGATION OF BLADDER PLACEMENT OF FOLEY;  Surgeon: Ceasar Mons, MD;  Location: [place];  Service: Urology;  Laterality: N/A;  . CYSTOSCOPY WITH FULGERATION N/A 03/27/2018   Procedure: [place] AND MUCOUS EVACUATION, BLADDER BIOPSY;  Surgeon: Franchot Gallo, MD;  Location: [facility];  Service: Urology;  Laterality: N/A;  . EXTRACORPOREAL SHOCK WAVE LITHOTRIPSY    . HERNIA REPAIR    . INGUINAL HERNIA REPAIR Right 1975  . PERIPHERAL  VASCULAR BALLOON ANGIOPLASTY  11/22/2017   Procedure: PERIPHERAL VASCULAR BALLOON ANGIOPLASTY;  Surgeon: Serafina Mitchell, MD;  Location: [place] [facility];  Service: Cardiovascular;;  rt. arm fistula  . PERIPHERAL VASCULAR CATHETERIZATION N/A 06/22/2016   Procedure: Left Arm Venography;  Surgeon: Elam Dutch, MD;  Location: [place] [facility];  Service: Cardiovascular;  Laterality: N/A;  . PERIPHERAL VASCULAR CATHETERIZATION N/A 06/22/2016   Procedure: A/V Fistulagram - Right Arm;  Surgeon: Elam Dutch, MD;  Location: [place] [facility];  Service: Cardiovascular;  Laterality: N/A;  . PERIPHERAL VASCULAR CATHETERIZATION Right 06/22/2016   Procedure: Peripheral Vascular Balloon Angioplasty;  Surgeon: Elam Dutch, MD;  Location: [place] [facility];  Service: Cardiovascular;  Laterality: Right;  arm fistula    Social History:  reports that he quit smoking about 25 years ago. His smoking use included cigarettes. He has a 5.00 pack-year smoking history. He has never used smokeless tobacco. He reports previous alcohol use. He reports current drug use. Drugs: Marijuana and Cocaine.  Family History:  Family History  Problem Relation Age of Onset  . Cancer Mother   . Diabetes Mother   . Hypertension Mother   . Deep vein thrombosis Sister   . Deep vein thrombosis Daughter      Prior to Admission medications   Medication Sig Start Date End Date Taking? Authorizing Provider  calcitRIOL (ROCALTROL) 0.5 MCG capsule Take 1.5 mcg by mouth daily. 10/25/17  Yes [provider]  calcium acetate (PHOSLO) 667 MG capsule Take 1,334 mg by mouth 3 (three) times daily with meals.    Yes [provider]  hydrALAZINE (APRESOLINE) 50 MG tablet Take 50 mg by mouth 3 (three) times daily. 05/11/16  Yes [provider]  labetalol (NORMODYNE) 200 MG tablet Take 200 mg by mouth 2 (two) times daily. 05/11/16  Yes [provider]  lidocaine-prilocaine (EMLA) cream Apply 1  application topically See admin instructions. APPLY SMALL AMOUNT TO ACCESS SITE (AVF) 1 HOUR BEFORE DIALYSIS. COVER WITH OCCLUSIVE DRESSING (SARAN WRAP) 11/14/17  Yes [provider]  Multiple Vitamins-Minerals (MULTIVITAMIN PO) Take 1 capsule by mouth daily.   Yes [provider]  oxycodone (ROXICODONE) 30 MG immediate release tablet Take 30 mg by mouth 3 (three) times daily.   Yes [provider]  Oxycodone HCl 10 MG TABS Take 10 mg by mouth 3 (three) times daily as needed (for pain).  05/22/16  Yes [provider]  promethazine (PHENERGAN) 12.5 MG tablet Take 1 tablet (12.5 mg total) by mouth every 6 (six) hours as needed for nausea or vomiting. Patient not taking: Reported on 06/17/2018 03/28/18   Dorothey Baseman, MD    Physical Exam: Vitals:   06/17/18 1611 06/17/18 1700 06/17/18 1730 06/17/18 2030  BP: (!) 167/89 (!) 171/107 (!) 168/97 (!) 172/99  Pulse: (!) 116 (!) 119 (!) 122 (!) 122  Resp: 17 (!) 25 (!) 25 (!) 24  Temp: 97.9 F (36.6 C)     TempSrc: Oral     SpO2: 96% 97% 96% 98%  Weight: 49 kg     Height: 5\' 4"  (1.626 m)      General: Not in acute distress HEENT:       Eyes: PERRL, EOMI, no scleral icterus.       ENT: No discharge from the ears and nose, no pharynx injection, no tonsillar enlargement.        Neck: No JVD, no bruit, no mass felt. Heme: No neck lymph node enlargement. Cardiac: S1/S2, RRR, No murmurs, No gallops or rubs. Respiratory:  No rales, wheezing, rhonchi or rubs. GI: Soft, nondistended, nontender, no rebound pain, no organomegaly, BS present. GU: No hematuria Ext: 1+ pitting leg edema bilaterally. 2+DP/PT pulse bilaterally. Musculoskeletal: No joint deformities, No joint redness or warmth, no limitation of ROM in spin. Skin: No rashes.  Neuro: Alert, oriented X3, cranial nerves II-XII grossly intact, moves all extremities normally.  Psych: Patient is not psychotic, no suicidal or hemocidal ideation.  Labs on  Admission: I have personally reviewed following labs and imaging studies  CBC: Recent Labs  Lab 06/17/18 1801  WBC 4.9  HGB 9.6*  HCT 32.3*  MCV 116.2*  PLT 96*   Basic Metabolic Panel: Recent Labs  Lab 06/17/18 1801  NA 141  K 4.0  CL 103  CO2 25  GLUCOSE 98  BUN 50*  CREATININE 7.90*  CALCIUM 9.5   GFR: Estimated Creatinine Clearance: 6.7 mL/min (A) (by C-G formula based on SCr of 7.9 mg/dL (H)). Liver Function Tests: No results for input(s): AST, ALT, ALKPHOS, BILITOT, PROT, ALBUMIN in the last 168 hours. No results for input(s): LIPASE, AMYLASE in the last 168 hours. No results for input(s): AMMONIA in the last 168 hours. Coagulation Profile: No results for input(s): INR, PROTIME in the last 168 hours. Cardiac Enzymes: Recent Labs  Lab 06/17/18 1800  TROPONINI 0.24*   BNP (last 3 results) No results for input(s): PROBNP in the last 8760 hours. HbA1C: No results for input(s): HGBA1C in the last 72 hours. CBG: Recent Labs  Lab 06/17/18 1709  GLUCAP 90   Lipid Profile: No results for input(s): CHOL, HDL, LDLCALC, TRIG, CHOLHDL, LDLDIRECT in the last 72 hours. Thyroid Function Tests: No results for input(s): TSH, T4TOTAL, FREET4, T3FREE, THYROIDAB in the last 72 hours. Anemia Panel: No results for input(s): VITAMINB12, FOLATE, FERRITIN, TIBC, IRON, RETICCTPCT in the last 72 hours. Urine analysis:    Component Value Date/Time   COLORURINE STRAW (A) 03/28/2018 0300   APPEARANCEUR CLEAR 03/28/2018 0300   LABSPEC 1.000 (L) 03/28/2018 0300   PHURINE 6.0 03/28/2018 0300   GLUCOSEU NEGATIVE 03/28/2018 0300   HGBUR NEGATIVE 03/28/2018 0300   BILIRUBINUR NEGATIVE 03/28/2018 0300   KETONESUR NEGATIVE 03/28/2018 0300   PROTEINUR NEGATIVE 03/28/2018 0300   NITRITE NEGATIVE 03/28/2018 0300   LEUKOCYTESUR NEGATIVE 03/28/2018 0300   Sepsis Labs: @LABRCNTIP (procalcitonin:4,lacticidven:4) )No results found for this or any previous visit (from the past 240  hour(s)).   Radiological Exams on Admission: Ct Angio Chest Pe W And/or Wo Contrast  Result Date: 06/17/2018 CLINICAL DATA:  Short of breath for 3 days EXAM: CT ANGIOGRAPHY CHEST WITH CONTRAST TECHNIQUE: Multidetector CT imaging of the chest was performed using the standard protocol during bolus administration of intravenous contrast. Multiplanar CT image reconstructions and MIPs were obtained to evaluate the vascular anatomy. CONTRAST:  161mL ISOVUE-370 IOPAMIDOL (ISOVUE-370)  INJECTION 76% COMPARISON:  11/13/2017 FINDINGS: Cardiovascular: There are no filling defects in the pulmonary arterial tree to suggest acute pulmonary thromboembolism. Atherosclerotic calcifications of the aortic arch are noted. There is no evidence of aortic aneurysm. The heart is markedly enlarged. Venous stent in the right subclavian and innominate veins is stable. Mediastinum/Nodes: There is no abnormal mediastinal adenopathy. There is fluid density within the mediastinal fat suggesting edema. No pericardial effusion. Small hiatal hernia is suspected. Lungs/Pleura: Lungs are under aerated with small bilateral pleural effusions. There is confluent ground-glass with an upper lobe predilection. There is subsegmental atelectasis in the lingula. Dependent atelectasis posteriorly. No pneumothorax. Upper Abdomen: Multiple benign appearing cysts in liver and kidneys are noted. There is a small amount of free fluid anterior to the liver. Musculoskeletal: No vertebral compression deformity. There is fluid density throughout the subcutaneous fat. Review of the MIP images confirms the above findings. IMPRESSION: No evidence of acute pulmonary thromboembolism. The constellation of findings including pleural effusions, confluent ground-glass, mediastinal edema, and subcutaneous edema suggests volume overload. Small hiatal hernia. Aortic Atherosclerosis (ICD10-I70.0). Electronically Signed   By: Marybelle Killings M.D.   On: 06/17/2018 19:48   Dg Chest  Portable 1 View  Result Date: 06/17/2018 CLINICAL DATA:  Generalized weakness and abdominal pain EXAM: PORTABLE CHEST 1 VIEW COMPARISON:  11/13/2017 FINDINGS: The heart is moderately enlarged. Vascular congestion. Bilateral central and basilar hazy airspace opacities are present. Small pleural effusions. Venous stent projects over the right upper lung zone. No pneumothorax. IMPRESSION: The above findings are most consistent with CHF. There is central basilar edema and small pleural effusions Electronically Signed   By: Marybelle Killings M.D.   On: 06/17/2018 17:56     EKG: Independently reviewed.  Sinus rhythm, QTC 578, LAE, LAD, poor R wave progression, anteroseptal infarction pattern, low voltage.   Assessment/Plan Principal Problem:   Fluid overload Active Problems:   ESRD on dialysis (HCC)   CAD (coronary atherosclerotic disease)   SOB (shortness of breath)   HTN (hypertension)   Thrombocytopenia (HCC)   Anemia in ESRD (end-stage renal disease) (HCC)   Elevated troponin   Fluid overload: Patient's shortness breath is most likely due to fluid overload.  Patient has 1+ leg edema, CT angiogram is negative for PE, showed evidence of volume overload.  Patient will need extra hemodialysis.  Nephrologist, Dr. Marval Regal was consulted by EDP.  Likely dialysis in the early morning.  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 -f/u renal recommendations  Hx of CAD and Elevated troponin: Troponin is elevated at 0.24.  Patient denies any chest pain.  His shortness breath is likely due to fluid overload.  Patient may have demand ischemia due to fluid overload. - cycle CE q6 x3 and repeat EKG in the am  - prn Nitroglycerin, Morphine - start aspirin 81 mg daily - Risk factor stratification: will check FLP and A1C, UDS - did not order 2d echo--> please  reevaluate pt in AM to decide if pt needs 2D echo. - card consult was requested via Epic  HTN:  -Continue home medications: Hydralazine, labetalol -IV hydralazine prn  Thrombocytopenia ([place]): This is chronic issue.  Platelet 96.  No bleeding tendency. -will follow-up with CBC  Anemia in ESRD (end-stage renal disease) ([place]): Hemoglobin stable.  8. 6 on 05/31/2018--> 9.6 today. To follow-up with CBC  DVT ppx: SCD Code Status: Full code Family Communication: None at bed side.  Disposition Plan:  Anticipate discharge back to previous home environment Consults called:  none Admission status: Obs / tele    Date of Service 06/17/2018    [facility]   If 7PM-7AM, please contact night-coverage [email or web address] Password [facility] 06/17/2018, 10:39 PM

## 2018-06-17 NOTE — ED Notes (Signed)
Pt stated that he is on dialysis and cannot urinate.  Informed RN and MD.

## 2018-06-17 NOTE — ED Notes (Signed)
CRITICAL VALUE ALERT  Critical Value: 0.24 Trop  Date & Time Notied:  06/17/2018 1910  Provider Notified: Erlene Quan PA

## 2018-06-17 NOTE — ED Notes (Signed)
ED TO INPATIENT HANDOFF REPORT  Name/Age/Gender Logan Martin 63 y.o. male  Code Status    Code Status Orders  (From admission, onward)         Start     Ordered   06/17/18 2105  Full code  Continuous     06/17/18 2105        Code Status History    Date Active Date Inactive Code Status Order ID Comments User Context   03/27/2018 2039 03/28/2018 2014 Full Code [id number]  Franchot Gallo, MD Inpatient   11/21/2017 2039 11/22/2017 2224 Full Code [id number]  Bethena Roys, MD ED   10/14/2017 0044 10/15/2017 1409 Full Code [id number]  Harvie Bridge, DO Inpatient   06/22/2016 1220 06/22/2016 1558 Full Code [id number]  Elam Dutch, MD Inpatient   06/14/2016 1946 06/17/2016 [place] Full Code [id number]  Jule Ser, DO ED   06/07/2013 0236 06/09/2013 1638 Full Code [id number]  Elgergawy, Silver Huguenin, MD Inpatient   05/10/2013 0607 05/10/2013 1704 Full Code [id number]  Theressa Millard, MD Inpatient   11/27/2012 1052 11/28/2012 1725 Full Code [id number]  Olga Millers, MD Inpatient      Home/SNF/Other Home  Chief Complaint anemia  Level of Care/Admitting Diagnosis ED Disposition    ED Disposition Condition [facility] Area: [place] [100100]  Level of Care: Cardiac Telemetry [103]  I expect the patient will be discharged within 24 hours: No (not a candidate for 5C-Observation unit)  Diagnosis: Fluid overload [716967]  Admitting Physician: Ivor Costa [[id number]]  Attending Physician: Ivor Costa [[id number]]  PT Class (Do Not Modify): Observation [104]  PT Acc Code (Do Not Modify): Observation [10022]       Medical History Past Medical History:  Diagnosis Date  . Alcohol abuse    quit date in 1995  . Anemia   . Chronic low back pain   . Cocaine abuse in remission ([place])    Quit date in 1995  . 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 Started HD in [place], [place].  Peritoneal dialysis failure due to  ventral hernias.  On [facility] home hemo since 2010. Using RUA AVF.    [name] ESRD on dialysis ([place])    "TTS; [place]" (06/14/2016)  . Hepatitis C   . History of blood transfusion 2007 X 1  . History of kidney stones   . Hypertension   . Peripheral vascular disease ([place])   . Pulmonary embolism [facility]) Feb 2012   Treated with coumadin x 1 year  . Renal insufficiency     Allergies Allergies  Allergen Reactions  . Heparin Rash    Pork products   . Pork-Derived Products Rash  . Sulfa Antibiotics Rash    IV Location/Drains/Wounds Patient Lines/Drains/Airways Status   Active Line/Drains/Airways    Name:   Placement date:   Placement time:   Site:   Days:   Peripheral IV 06/17/18 Left;Medial Forearm   06/17/18    1806    Forearm   less than 1   Fistula / Graft Right Upper arm   -    -    Upper arm      Fistula / Graft Right Upper arm Arteriovenous fistula   -    -    Upper arm      Incision (Closed) 10/13/17 Other (Comment) Other (Comment)   10/13/17    1814     247   Incision (  Closed) 10/13/17 Other (Comment) Other (Comment)   10/13/17    2258     247          Labs/Imaging Results for orders placed or performed during the hospital encounter of 06/17/18 (from the past 48 hour(s))  CBG monitoring, ED     Status: None   Collection Time: 06/17/18  5:09 PM  Result Value Ref Range   Glucose-Capillary 90 70 - 99 mg/dL  Troponin I - ONCE - STAT     Status: Abnormal   Collection Time: 06/17/18  6:00 PM  Result Value Ref Range   Troponin I 0.24 (HH) <0.03 ng/mL    Comment: CRITICAL RESULT CALLED TO, READ BACK BY AND VERIFIED WITH: S.Silvie Obremski AT 1910 ON 06/17/18 BY N.THOMPSON Performed at [facility], [place] [address], [place], [place] [address]   Basic metabolic panel     Status: Abnormal   Collection Time: 06/17/18  6:01 PM  Result Value Ref Range   Sodium 141 135 - 145 mmol/L   Potassium 4.0 3.5 - 5.1 mmol/L   Chloride 103 98 - 111 mmol/L   CO2 25 22 - 32  mmol/L   Glucose, Bld 98 70 - 99 mg/dL   BUN 50 (H) 8 - 23 mg/dL   Creatinine, Ser 7.90 (H) 0.61 - 1.24 mg/dL   Calcium 9.5 8.9 - 10.3 mg/dL   GFR calc non Af Amer 7 (L) >60 mL/min   GFR calc Af Amer 8 (L) >60 mL/min   Anion gap 13 5 - 15    Comment: Performed at [facility], [place] [address], [place], [place] [address]  CBC     Status: Abnormal   Collection Time: 06/17/18  6:01 PM  Result Value Ref Range   WBC 4.9 4.0 - 10.5 K/uL   RBC 2.78 (L) 4.22 - 5.81 MIL/uL   Hemoglobin 9.6 (L) 13.0 - 17.0 g/dL   HCT 32.3 (L) 39.0 - 52.0 %   MCV 116.2 (H) 80.0 - 100.0 fL   MCH 34.5 (H) 26.0 - 34.0 pg   MCHC 29.7 (L) 30.0 - 36.0 g/dL   RDW 21.1 (H) 11.5 - 15.5 %   Platelets 96 (L) 150 - 400 K/uL    Comment: REPEATED TO VERIFY PLATELET COUNT CONFIRMED BY SMEAR SPECIMEN CHECKED FOR CLOTS Immature Platelet Fraction may be clinically indicated, consider ordering this additional test [id number]    nRBC 0.6 (H) 0.0 - 0.2 %    Comment: Performed at [facility], [place] [address], [place], [place] [address]   Ct Angio Chest Pe W And/or Wo Contrast  Result Date: 06/17/2018 CLINICAL DATA:  Short of breath for 3 days EXAM: CT ANGIOGRAPHY CHEST WITH CONTRAST TECHNIQUE: Multidetector CT imaging of the chest was performed using the standard protocol during bolus administration of intravenous contrast. Multiplanar CT image reconstructions and MIPs were obtained to evaluate the vascular anatomy. CONTRAST:  123mL ISOVUE-370 IOPAMIDOL (ISOVUE-370) INJECTION 76% COMPARISON:  11/13/2017 FINDINGS: Cardiovascular: There are no filling defects in the pulmonary arterial tree to suggest acute pulmonary thromboembolism. Atherosclerotic calcifications of the aortic arch are noted. There is no evidence of aortic aneurysm. The heart is markedly enlarged. Venous stent in the right subclavian and innominate veins is stable. Mediastinum/Nodes: There is no abnormal mediastinal adenopathy.  There is fluid density within the mediastinal fat suggesting edema. No pericardial effusion. Small hiatal hernia is suspected. Lungs/Pleura: Lungs are under aerated with small bilateral pleural effusions. There is confluent ground-glass  with an upper lobe predilection. There is subsegmental atelectasis in the lingula. Dependent atelectasis posteriorly. No pneumothorax. Upper Abdomen: Multiple benign appearing cysts in liver and kidneys are noted. There is a small amount of free fluid anterior to the liver. Musculoskeletal: No vertebral compression deformity. There is fluid density throughout the subcutaneous fat. Review of the MIP images confirms the above findings. IMPRESSION: No evidence of acute pulmonary thromboembolism. The constellation of findings including pleural effusions, confluent ground-glass, mediastinal edema, and subcutaneous edema suggests volume overload. Small hiatal hernia. Aortic Atherosclerosis (ICD10-I70.0). Electronically Signed   By: Marybelle Killings M.D.   On: 06/17/2018 19:48   Dg Chest Portable 1 View  Result Date: 06/17/2018 CLINICAL DATA:  Generalized weakness and abdominal pain EXAM: PORTABLE CHEST 1 VIEW COMPARISON:  11/13/2017 FINDINGS: The heart is moderately enlarged. Vascular congestion. Bilateral central and basilar hazy airspace opacities are present. Small pleural effusions. Venous stent projects over the right upper lung zone. No pneumothorax. IMPRESSION: The above findings are most consistent with CHF. There is central basilar edema and small pleural effusions Electronically Signed   By: Marybelle Killings M.D.   On: 06/17/2018 17:56   None  Pending Labs Unresulted Labs (From admission, onward)    Start     Ordered   06/18/18 0500  Hemoglobin A1c  Tomorrow morning,   R     06/17/18 2102   06/18/18 0500  Lipid panel  Tomorrow morning,   R    Comments:  Please obtain as a fasting lipid panel - should not have eaten/ drank food for 8 hours prior to labs.    06/17/18 2102    06/17/18 2103  Urine rapid drug screen (hosp performed)  ONCE - STAT,   R     06/17/18 2102   06/17/18 2103  Troponin I - Now Then Q6H  Now then every 6 hours,   R     06/17/18 2102   06/17/18 2100  Troponin I - ONCE - STAT  ONCE - STAT,   STAT     06/17/18 2047          Vitals/Pain Today's Vitals   06/17/18 1614 06/17/18 1700 06/17/18 1730 06/17/18 2030  BP:  (!) 171/107 (!) 168/97 (!) 172/99  Pulse:  (!) 119 (!) 122 (!) 122  Resp:  (!) 25 (!) 25 (!) 24  Temp:      TempSrc:      SpO2:  97% 96% 98%  Weight:      Height:      PainSc: 7        Isolation Precautions No active isolations  Medications Medications  sodium chloride flush (NS) 0.9 % injection 3 mL (has no administration in time range)  sodium chloride (PF) 0.9 % injection (has no administration in time range)  iopamidol (ISOVUE-370) 76 % injection (has no administration in time range)  hydrALAZINE (APRESOLINE) tablet 50 mg (50 mg Oral Given 06/17/18 2214)  labetalol (NORMODYNE) tablet 200 mg (200 mg Oral Given 06/17/18 2214)  calcitRIOL (ROCALTROL) capsule 1.5 mcg (has no administration in time range)  calcium acetate (PHOSLO) capsule 1,334 mg (has no administration in time range)  multivitamin liquid 15 mL (has no administration in time range)  lidocaine-prilocaine (EMLA) cream 1 application (has no administration in time range)  oxyCODONE (Oxy IR/ROXICODONE) immediate release tablet 10 mg (has no administration in time range)  nitroGLYCERIN (NITROSTAT) SL tablet 0.4 mg (has no administration in time range)  morphine 2 MG/ML injection 2 mg (has  no administration in time range)  aspirin EC tablet 81 mg (has no administration in time range)  levalbuterol (XOPENEX) nebulizer solution 1.25 mg (has no administration in time range)  dextromethorphan-guaiFENesin (MUCINEX DM) 30-600 MG per 12 hr tablet 1 tablet (has no administration in time range)  acetaminophen (TYLENOL) tablet 650 mg (has no administration in time  range)    Or  acetaminophen (TYLENOL) suppository 650 mg (has no administration in time range)  senna-docusate (Senokot-S) tablet 1 tablet (has no administration in time range)  ondansetron (ZOFRAN) tablet 4 mg (has no administration in time range)    Or  ondansetron (ZOFRAN) injection 4 mg (has no administration in time range)  hydrALAZINE (APRESOLINE) injection 5 mg (has no administration in time range)  zolpidem (AMBIEN) tablet 5 mg (has no administration in time range)  iopamidol (ISOVUE-370) 76 % injection 100 mL (100 mLs Intravenous Contrast Given 06/17/18 1922)    Mobility walks

## 2018-06-17 NOTE — ED Provider Notes (Signed)
[place] DEPT Provider Note   CSN: [id number] Arrival date & time: 06/17/18  1558     History   Chief Complaint Chief Complaint  Patient presents with  . Weakness  . Anemia    HPI Logan Martin is a 63 y.o. male with history of ERS D, PE, anemia, hepatitis C presenting today for generalized weakness as well as shortness of breath.  Patient reports that over the past 1 week he has had increasing generalized weakness and shortness of breath.  Patient states that whenever he attempts to exert himself, move around in bed he becomes short of breath.  He denies shortness of breath at rest.  He denies chest pain or pleuritic pain on my examination today.  Patient states that he was evaluated by his nephrologist today who was concerned for symptomatic anemia and asked him to present to the ER.  Patient denies recent fever, illness, chest pain, abdominal pain, nausea/vomiting  HPI  Past Medical History:  Diagnosis Date  . Alcohol abuse    quit date in 1995  . Anemia   . Chronic low back pain   . Cocaine abuse in remission ([place])    Quit date in 1995  . 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 Started HD in [place].  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  On NxStage home hemo since 2010. Using RUA AVF.    [name] ESRD on dialysis ([place])    "TTS; [place]" (06/14/2016)  . Hepatitis C   . History of blood transfusion 2007 X 1  . History of kidney stones   . Hypertension   . Peripheral vascular disease ([place])   . Pulmonary embolism [facility]) Feb 2012   Treated with coumadin x 1 year  . Renal insufficiency     Patient Active Problem List   Diagnosis Date Noted  . Fluid overload 06/17/2018  . Elevated troponin 06/17/2018  . Hematuria 03/27/2018  . Hypokalemia 03/27/2018  . Thrombocytopenia ([place]) 03/27/2018  . Anemia in ESRD (end-stage renal disease) ([place]) 03/27/2018  . Bladder tumor 03/27/2018    . 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 HTN (hypertension) 10/14/2017  . Pyelocystitis 10/13/2017  . Pancytopenia ([place]) 06/14/2016  . Left arm pain 06/14/2016  . 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 Mechanical complication of other vascular device, implant, and graft 08/10/2013  . End stage renal disease ([place]) 08/10/2013  . ESRD (end stage renal disease) ([place]) 06/07/2013  . Nausea and vomiting 06/07/2013  . SOB (shortness of breath) 05/10/2013  . Chest discomfort 11/27/2012  . Shortness of breath 11/27/2012  . Chronic hepatitis C without hepatic coma ([place]) 02/04/2012  . Secondary hyperparathyroidism (of renal origin) 02/04/2012  . Anemia associated with chronic renal failure 02/04/2012  . CAD (coronary atherosclerotic disease) 02/04/2012  . Pulmonary embolism ([place]) 07/20/2010  . CORONARY ARTERY DISEASE 02/23/2009  . HEMORRHOIDS, INTERNAL 02/23/2009  . GERD 02/23/2009  . ESRD on dialysis [facility]) 02/16/2009    Past Surgical History:  Procedure Laterality Date  . A/V FISTULAGRAM N/A 11/22/2017   Procedure: A/V Fistulagram;  Surgeon: Serafina Mitchell, MD;  Location: [place] [facility] LAB;  Service: Cardiovascular;  Laterality: N/A;  rt. arm   . ABDOMINAL HERNIA REPAIR  ~ 2000 X 2; 2001 X1  . AV FISTULA PLACEMENT Right 2007   upper arm  . AV FISTULA REPAIR Right ~ 03/2016 & 04/2016  . BLADDER AUGMENTATION  1975  .  CORONARY ANGIOPLASTY WITH STENT PLACEMENT  1999  . CYSTOSCOPY W/ URETERAL STENT PLACEMENT N/A 10/13/2017   Procedure: CYSTOSCOPY WITH IRRIGATION OF BLADDER PLACEMENT OF FOLEY;  Surgeon: Ceasar Mons, MD;  Location: [place];  Service: Urology;  Laterality: N/A;  . CYSTOSCOPY WITH FULGERATION N/A 03/27/2018   Procedure: [place] AND MUCOUS EVACUATION, BLADDER BIOPSY;  Surgeon: Franchot Gallo, MD;  Location: [facility];  Service: Urology;  Laterality: N/A;  . EXTRACORPOREAL SHOCK WAVE LITHOTRIPSY    . HERNIA REPAIR    . INGUINAL HERNIA REPAIR  Right 1975  . PERIPHERAL VASCULAR BALLOON ANGIOPLASTY  11/22/2017   Procedure: PERIPHERAL VASCULAR BALLOON ANGIOPLASTY;  Surgeon: Serafina Mitchell, MD;  Location: [place] CV LAB;  Service: Cardiovascular;;  rt. arm fistula  . PERIPHERAL VASCULAR CATHETERIZATION N/A 06/22/2016   Procedure: Left Arm Venography;  Surgeon: Elam Dutch, MD;  Location: [place] [facility];  Service: Cardiovascular;  Laterality: N/A;  . PERIPHERAL VASCULAR CATHETERIZATION N/A 06/22/2016   Procedure: A/V Fistulagram - Right Arm;  Surgeon: Elam Dutch, MD;  Location: [place] [facility];  Service: Cardiovascular;  Laterality: N/A;  . PERIPHERAL VASCULAR CATHETERIZATION Right 06/22/2016   Procedure: Peripheral Vascular Balloon Angioplasty;  Surgeon: Elam Dutch, MD;  Location: [place] [facility];  Service: Cardiovascular;  Laterality: Right;  arm fistula        Home Medications    Prior to Admission medications   Medication Sig Start Date End Date Taking? Authorizing Provider  calcitRIOL (ROCALTROL) 0.5 MCG capsule Take 1.5 mcg by mouth daily. 10/25/17  Yes [provider]  calcium acetate (PHOSLO) 667 MG capsule Take 1,334 mg by mouth 3 (three) times daily with meals.    Yes [provider]  hydrALAZINE (APRESOLINE) 50 MG tablet Take 50 mg by mouth 3 (three) times daily. 05/11/16  Yes [provider]  labetalol (NORMODYNE) 200 MG tablet Take 200 mg by mouth 2 (two) times daily. 05/11/16  Yes [provider]  lidocaine-prilocaine (EMLA) cream Apply 1 application topically See admin instructions. APPLY SMALL AMOUNT TO ACCESS SITE (AVF) 1 HOUR BEFORE DIALYSIS. COVER WITH OCCLUSIVE DRESSING ([place]) 11/14/17  Yes [provider]  Multiple Vitamins-Minerals (MULTIVITAMIN PO) Take 1 capsule by mouth daily.   Yes [provider]  oxycodone (ROXICODONE) 30 MG immediate release tablet Take 30 mg by mouth 3 (three) times daily.   Yes [provider]   Oxycodone HCl 10 MG TABS Take 10 mg by mouth 3 (three) times daily as needed (for pain).  05/22/16  Yes [provider]  promethazine (PHENERGAN) 12.5 MG tablet Take 1 tablet (12.5 mg total) by mouth every 6 (six) hours as needed for nausea or vomiting. Patient not taking: Reported on 06/17/2018 03/28/18   Dorothey Baseman, MD   Family History Family History  Problem Relation Age of Onset  . Cancer Mother   . Diabetes Mother   . Hypertension Mother   . Deep vein thrombosis Sister   . Deep vein thrombosis Daughter     Social History Social History   Tobacco Use  . Smoking status: Former Smoker    Packs/day: 1.00    Years: 5.00    Pack years: 5.00    Types: Cigarettes    Last attempt to quit: 05/28/1993    Years since quitting: 25.0  . Smokeless tobacco: Never Used  Substance Use Topics  . Alcohol use: Not Currently    Comment: 06/14/2016 "nothing since 1995"  .  Drug use: Yes    Types: Marijuana, Cocaine    Comment: 06/14/2016 "nothing since 1995"     Allergies   Heparin; Pork-derived products; and Sulfa antibiotics   Review of Systems Review of Systems  Constitutional: Negative.  Negative for chills and fever.  Respiratory: Positive for shortness of breath. Negative for cough.   Cardiovascular: Positive for leg swelling (Chronic bilateral). Negative for chest pain.  Gastrointestinal: Negative.  Negative for abdominal pain, diarrhea, nausea and vomiting.  Musculoskeletal: Negative.  Negative for arthralgias and myalgias.  Neurological: Negative.  Negative for dizziness, weakness and headaches.  All other systems reviewed and are negative.  Physical Exam Updated Vital Signs BP (!) 172/99   Pulse (!) 122   Temp 97.9 F (36.6 C) (Oral)   Resp (!) 24   Ht 5\' 4"  (1.626 m)   Wt 49 kg   SpO2 98%   BMI 18.54 kg/m   Physical Exam Constitutional:      General: He is not in acute distress.    Appearance: Normal appearance. He is well-developed. He is not  ill-appearing or diaphoretic.  HENT:     Head: Normocephalic and atraumatic.     Right Ear: External ear normal.     Left Ear: External ear normal.     Nose: Nose normal.     Mouth/Throat:     Mouth: Mucous membranes are moist.     Pharynx: Oropharynx is clear.  Eyes:     Extraocular Movements: Extraocular movements intact.     Conjunctiva/sclera: Conjunctivae normal.     Pupils: Pupils are equal, round, and reactive to light.  Neck:     Musculoskeletal: Normal range of motion.     Trachea: Trachea normal. No tracheal deviation.  Cardiovascular:     Rate and Rhythm: Regular rhythm. Tachycardia present.     Pulses:          Dorsalis pedis pulses are 1+ on the right side and 1+ on the left side.       Posterior tibial pulses are 1+ on the right side and 1+ on the left side.     Heart sounds: Normal heart sounds.  Pulmonary:     Effort: Pulmonary effort is normal. No respiratory distress.     Breath sounds: Normal breath sounds.  Abdominal:     Palpations: Abdomen is soft.     Tenderness: There is no abdominal tenderness. There is no guarding or rebound.  Musculoskeletal: Normal range of motion.        General: No tenderness.     Right lower leg: Edema present.     Left lower leg: Edema present.     Comments: Ambulatory around emergency department without acute distress.  Feet:     Right foot:     Protective Sensation: 3 sites tested. 3 sites sensed.     Left foot:     Protective Sensation: 3 sites tested. 3 sites sensed.  Skin:    General: Skin is warm and dry.  Neurological:     General: No focal deficit present.     Mental Status: He is alert.     GCS: GCS eye subscore is 4. GCS verbal subscore is 5. GCS motor subscore is 6.     Comments: Speech is clear and goal oriented, follows commands Major Cranial nerves without deficit, no facial droop Normal strength in upper and lower extremities bilaterally including dorsiflexion and plantar flexion, strong and equal grip  strength Sensation normal to light touch  Moves extremities without ataxia, coordination intact Normal gait  Psychiatric:        Behavior: Behavior normal.    ED Treatments / Results  Labs (all labs ordered are listed, but only abnormal results are displayed) Labs Reviewed  BASIC METABOLIC PANEL - Abnormal; Notable for the following components:      Result Value   BUN 50 (*)    Creatinine, Ser 7.90 (*)    GFR calc non Af Amer 7 (*)    GFR calc Af Amer 8 (*)    All other components within normal limits  CBC - Abnormal; Notable for the following components:   RBC 2.78 (*)    Hemoglobin 9.6 (*)    HCT 32.3 (*)    MCV 116.2 (*)    MCH 34.5 (*)    MCHC 29.7 (*)    RDW 21.1 (*)    Platelets 96 (*)    nRBC 0.6 (*)    All other components within normal limits  TROPONIN I - Abnormal; Notable for the following components:   Troponin I 0.24 (*)    All other components within normal limits  TROPONIN I  RAPID URINE DRUG SCREEN, HOSP PERFORMED  HEMOGLOBIN A1C  LIPID PANEL  TROPONIN I  TROPONIN I  CBG MONITORING, ED    EKG None  Radiology Ct Angio Chest Pe W And/or Wo Contrast  Result Date: 06/17/2018 CLINICAL DATA:  Short of breath for 3 days EXAM: CT ANGIOGRAPHY CHEST WITH CONTRAST TECHNIQUE: Multidetector CT imaging of the chest was performed using the standard protocol during bolus administration of intravenous contrast. Multiplanar CT image reconstructions and MIPs were obtained to evaluate the vascular anatomy. CONTRAST:  164mL ISOVUE-370 IOPAMIDOL (ISOVUE-370) INJECTION 76% COMPARISON:  11/13/2017 FINDINGS: Cardiovascular: There are no filling defects in the pulmonary arterial tree to suggest acute pulmonary thromboembolism. Atherosclerotic calcifications of the aortic arch are noted. There is no evidence of aortic aneurysm. The heart is markedly enlarged. Venous stent in the right subclavian and innominate veins is stable. Mediastinum/Nodes: There is no abnormal mediastinal  adenopathy. There is fluid density within the mediastinal fat suggesting edema. No pericardial effusion. Small hiatal hernia is suspected. Lungs/Pleura: Lungs are under aerated with small bilateral pleural effusions. There is confluent ground-glass with an upper lobe predilection. There is subsegmental atelectasis in the lingula. Dependent atelectasis posteriorly. No pneumothorax. Upper Abdomen: Multiple benign appearing cysts in liver and kidneys are noted. There is a small amount of free fluid anterior to the liver. Musculoskeletal: No vertebral compression deformity. There is fluid density throughout the subcutaneous fat. Review of the MIP images confirms the above findings. IMPRESSION: No evidence of acute pulmonary thromboembolism. The constellation of findings including pleural effusions, confluent ground-glass, mediastinal edema, and subcutaneous edema suggests volume overload. Small hiatal hernia. Aortic Atherosclerosis (ICD10-I70.0). Electronically Signed   By: Marybelle Killings M.D.   On: 06/17/2018 19:48   Dg Chest Portable 1 View  Result Date: 06/17/2018 CLINICAL DATA:  Generalized weakness and abdominal pain EXAM: PORTABLE CHEST 1 VIEW COMPARISON:  11/13/2017 FINDINGS: The heart is moderately enlarged. Vascular congestion. Bilateral central and basilar hazy airspace opacities are present. Small pleural effusions. Venous stent projects over the right upper lung zone. No pneumothorax. IMPRESSION: The above findings are most consistent with CHF. There is central basilar edema and small pleural effusions Electronically Signed   By: Marybelle Killings M.D.   On: 06/17/2018 17:56    Procedures .Critical Care Performed by: Deliah Boston, PA-C Authorized by: Nuala Alpha  A, PA-C   Critical care provider statement:    Critical care time (minutes):  34   Critical care was necessary to treat or prevent imminent or life-threatening deterioration of the following conditions:  Circulatory failure  (Elevated troponin)   Critical care was time spent personally by me on the following activities:  Discussions with consultants, evaluation of patient's response to treatment, examination of patient, ordering and performing treatments and interventions, ordering and review of laboratory studies, ordering and review of radiographic studies, pulse oximetry, re-evaluation of patient's condition, obtaining history from patient or surrogate, review of old charts and development of treatment plan with patient or surrogate   (including critical care time)  Medications Ordered in ED Medications  sodium chloride flush (NS) 0.9 % injection 3 mL (has no administration in time range)  sodium chloride (PF) 0.9 % injection (has no administration in time range)  iopamidol (ISOVUE-370) 76 % injection (has no administration in time range)  hydrALAZINE (APRESOLINE) tablet 50 mg (has no administration in time range)  labetalol (NORMODYNE) tablet 200 mg (has no administration in time range)  calcitRIOL (ROCALTROL) capsule 1.5 mcg (has no administration in time range)  calcium acetate (PHOSLO) capsule 1,334 mg (has no administration in time range)  multivitamin liquid 15 mL (has no administration in time range)  lidocaine-prilocaine (EMLA) cream 1 application (has no administration in time range)  oxyCODONE (Oxy IR/ROXICODONE) immediate release tablet 10 mg (has no administration in time range)  nitroGLYCERIN (NITROSTAT) SL tablet 0.4 mg (has no administration in time range)  morphine 2 MG/ML injection 2 mg (has no administration in time range)  aspirin EC tablet 81 mg (has no administration in time range)  levalbuterol (XOPENEX) nebulizer solution 1.25 mg (has no administration in time range)  dextromethorphan-guaiFENesin (MUCINEX DM) 30-600 MG per 12 hr tablet 1 tablet (has no administration in time range)  acetaminophen (TYLENOL) tablet 650 mg (has no administration in time range)    Or  acetaminophen (TYLENOL)  suppository 650 mg (has no administration in time range)  senna-docusate (Senokot-S) tablet 1 tablet (has no administration in time range)  ondansetron (ZOFRAN) tablet 4 mg (has no administration in time range)    Or  ondansetron (ZOFRAN) injection 4 mg (has no administration in time range)  hydrALAZINE (APRESOLINE) injection 5 mg (has no administration in time range)  zolpidem (AMBIEN) tablet 5 mg (has no administration in time range)  iopamidol (ISOVUE-370) 76 % injection 100 mL (100 mLs Intravenous Contrast Given 06/17/18 1922)     Initial Impression / Assessment and Plan / ED Course  I have reviewed the triage vital signs and the nursing notes.  Pertinent labs & imaging results that were available during my care of the patient were reviewed by me and considered in my medical decision making (see chart for details).     63 year old male, dialysis patient presenting today for 1 week of shortness of breath and generalized weakness.  Concern for symptomatic anemia by patient's PCP per patient.  On arrival patient is tachycardic however SPO2 is 98% on room air.  Patient is overall well-appearing and in no acute distress.  He is ambulatory and speaking full sentences without difficulty.  Unable to review nephrology notes through EMR. ---------- CBC appears baseline, hemoglobin 9.6 which is improved from previous BMP appears baseline Chest x-ray: IMPRESSION: The above findings are most consistent with CHF. There is central basilar edema and small pleural effusions ---------- Case discussed with Dr. Stark Jock, CT angio ordered. ----------- 7:02 PM:  Long discussion held with patient, and he is now agreeable to being placed on the cardiac monitor.  Plan of care including CT angios of the chest discussed and he is agreeable to this as well.  -------------------- 7:15 PM: Informed that patient's troponin 0.24. Discussed with Dr. Stark Jock, likely renal, will repeat after CT. -------------------- CT angio:    IMPRESSION:  No evidence of acute pulmonary thromboembolism.    The constellation of findings including pleural effusions, confluent  ground-glass, mediastinal edema, and subcutaneous edema suggests  volume overload.    Small hiatal hernia.    Aortic Atherosclerosis (ICD10-I70.0).  --------------- 8:10 PM: Discussed case with on-call nephrology, advises admission to hospitalist at [facility] and dialysis tomorrow morning.  Discussed troponin, will cycle troponins.  Low suspicion for ACS at this time, patient is chest pain-free. ------------------ 8:24 PM: Discussed case with hospitalist who will be seeing patient emergency department for admission. ---------------- Patient has been admitted to hospitalist service for further evaluation and treatment.   Patient's case discussed with Dr. Stark Jock during this visit.   Note: Portions of this report may have been transcribed using voice recognition software. Every effort was made to ensure accuracy; however, inadvertent computerized transcription errors may still be present.  Final Clinical Impressions(s) / ED Diagnoses   Final diagnoses:  Hypervolemia, unspecified hypervolemia type  End stage renal disease [facility])  Elevated troponin    ED Discharge Orders    None       Gari Crown 06/17/18 2150    Veryl Speak, MD 06/17/18 2252

## 2018-06-17 NOTE — ED Notes (Signed)
Attempt report x1  

## 2018-06-17 NOTE — ED Notes (Signed)
[facility] called for pt 

## 2018-06-17 NOTE — ED Notes (Signed)
Bed: ZS01 Expected date:  Expected time:  Means of arrival:  Comments: Hold for Triage 4

## 2018-06-17 NOTE — ED Notes (Signed)
Pt stated that he wanted to talk with the doctor before putting him on the monitor. Informed RN.

## 2018-06-18 ENCOUNTER — Other Ambulatory Visit: Payer: Self-pay

## 2018-06-18 DIAGNOSIS — Z882 Allergy status to sulfonamides status: Secondary | ICD-10-CM | POA: Diagnosis not present

## 2018-06-18 DIAGNOSIS — E8889 Other specified metabolic disorders: Secondary | ICD-10-CM | POA: Diagnosis present

## 2018-06-18 DIAGNOSIS — D631 Anemia in chronic kidney disease: Secondary | ICD-10-CM | POA: Diagnosis present

## 2018-06-18 DIAGNOSIS — F1011 Alcohol abuse, in remission: Secondary | ICD-10-CM | POA: Diagnosis present

## 2018-06-18 DIAGNOSIS — Z79899 Other long term (current) drug therapy: Secondary | ICD-10-CM | POA: Diagnosis not present

## 2018-06-18 DIAGNOSIS — Z86711 Personal history of pulmonary embolism: Secondary | ICD-10-CM | POA: Diagnosis not present

## 2018-06-18 DIAGNOSIS — Z888 Allergy status to other drugs, medicaments and biological substances status: Secondary | ICD-10-CM | POA: Diagnosis not present

## 2018-06-18 DIAGNOSIS — Z87891 Personal history of nicotine dependence: Secondary | ICD-10-CM | POA: Diagnosis not present

## 2018-06-18 DIAGNOSIS — I248 Other forms of acute ischemic heart disease: Secondary | ICD-10-CM | POA: Diagnosis present

## 2018-06-18 DIAGNOSIS — N186 End stage renal disease: Secondary | ICD-10-CM | POA: Diagnosis present

## 2018-06-18 DIAGNOSIS — R7989 Other specified abnormal findings of blood chemistry: Secondary | ICD-10-CM

## 2018-06-18 DIAGNOSIS — N2581 Secondary hyperparathyroidism of renal origin: Secondary | ICD-10-CM | POA: Diagnosis present

## 2018-06-18 DIAGNOSIS — Z8551 Personal history of malignant neoplasm of bladder: Secondary | ICD-10-CM | POA: Diagnosis not present

## 2018-06-18 DIAGNOSIS — D696 Thrombocytopenia, unspecified: Secondary | ICD-10-CM | POA: Diagnosis present

## 2018-06-18 DIAGNOSIS — B1920 Unspecified viral hepatitis C without hepatic coma: Secondary | ICD-10-CM | POA: Diagnosis present

## 2018-06-18 DIAGNOSIS — I739 Peripheral vascular disease, unspecified: Secondary | ICD-10-CM | POA: Diagnosis present

## 2018-06-18 DIAGNOSIS — Z955 Presence of coronary angioplasty implant and graft: Secondary | ICD-10-CM | POA: Diagnosis not present

## 2018-06-18 DIAGNOSIS — I120 Hypertensive chronic kidney disease with stage 5 chronic kidney disease or end stage renal disease: Secondary | ICD-10-CM | POA: Diagnosis present

## 2018-06-18 DIAGNOSIS — E8770 Fluid overload, unspecified: Secondary | ICD-10-CM | POA: Diagnosis present

## 2018-06-18 DIAGNOSIS — F1411 Cocaine abuse, in remission: Secondary | ICD-10-CM | POA: Diagnosis present

## 2018-06-18 DIAGNOSIS — Z992 Dependence on renal dialysis: Secondary | ICD-10-CM | POA: Diagnosis not present

## 2018-06-18 DIAGNOSIS — I2510 Atherosclerotic heart disease of native coronary artery without angina pectoris: Secondary | ICD-10-CM | POA: Diagnosis present

## 2018-06-18 LAB — TROPONIN I
Troponin I: 0.26 ng/mL (ref <0.03)
Troponin I: 0.25 ng/mL (ref <0.03)
Troponin I: 0.28 ng/mL (ref <0.03)

## 2018-06-18 LAB — RENAL FUNCTION PANEL
Albumin: 3.1 g/dL — ABNORMAL LOW (ref 3.5–5.0)
Anion gap: 14 (ref 5–15)
BUN: 56 mg/dL — ABNORMAL HIGH (ref 8–23)
CO2: 23 mmol/L (ref 22–32)
Calcium: 8.9 mg/dL (ref 8.9–10.3)
Chloride: 101 mmol/L (ref 98–111)
Creatinine, Ser: 8.57 mg/dL — ABNORMAL HIGH (ref 0.61–1.24)
GFR calc Af Amer: 7 mL/min — ABNORMAL LOW (ref >60)
GFR calc non Af Amer: 6 mL/min — ABNORMAL LOW (ref >60)
Glucose, Bld: 107 mg/dL — ABNORMAL HIGH (ref 70–99)
Phosphorus: 4.7 mg/dL — ABNORMAL HIGH (ref 2.5–4.6)
Potassium: 4.6 mmol/L (ref 3.5–5.1)
Sodium: 138 mmol/L (ref 135–145)

## 2018-06-18 LAB — CBC
HCT: 28.9 % — ABNORMAL LOW (ref 39.0–52.0)
Hemoglobin: 8.7 g/dL — ABNORMAL LOW (ref 13.0–17.0)
MCH: 33.3 pg (ref 26.0–34.0)
MCHC: 30.1 g/dL (ref 30.0–36.0)
MCV: 110.7 fL — AB (ref 80.0–100.0)
Platelets: 82 10*3/uL — ABNORMAL LOW (ref 150–400)
RBC: 2.61 MIL/uL — ABNORMAL LOW (ref 4.22–5.81)
RDW: 20.3 % — ABNORMAL HIGH (ref 11.5–15.5)
WBC: 4 10*3/uL (ref 4.0–10.5)
nRBC: 0.5 % — ABNORMAL HIGH (ref 0.0–0.2)

## 2018-06-18 LAB — HEPATITIS B SURFACE ANTIGEN: Hepatitis B Surface Ag: NEGATIVE

## 2018-06-18 LAB — MRSA PCR SCREENING: MRSA by PCR: NEGATIVE

## 2018-06-18 MED ORDER — ALTEPLASE 2 MG IJ SOLR: 2.0000 mg | Freq: Once | INTRAMUSCULAR | Status: DC | PRN

## 2018-06-18 MED ORDER — SODIUM CHLORIDE 0.9 % IV SOLN: 100.0000 mL | INTRAVENOUS | Status: DC | PRN

## 2018-06-18 MED ORDER — LEVALBUTEROL HCL 1.25 MG/0.5ML IN NEBU
1.2500 mg | INHALATION_SOLUTION | Freq: Three times a day (TID) | RESPIRATORY_TRACT | Status: DC
  Administered 2018-06-18: 1.25 mg via RESPIRATORY_TRACT
  Filled 2018-06-18 (×2): qty 0.5

## 2018-06-18 MED ORDER — LIDOCAINE-PRILOCAINE 2.5-2.5 % EX CREA: 1.0000 "application " | TOPICAL_CREAM | CUTANEOUS | Status: DC | PRN

## 2018-06-18 MED ORDER — CHLORHEXIDINE GLUCONATE CLOTH 2 % EX PADS
6.0000 | MEDICATED_PAD | Freq: Every day | CUTANEOUS | Status: DC
  Administered 2018-06-18: 6 via TOPICAL

## 2018-06-18 MED ORDER — LEVALBUTEROL HCL 1.25 MG/0.5ML IN NEBU: 1.2500 mg | INHALATION_SOLUTION | Freq: Four times a day (QID) | RESPIRATORY_TRACT | Status: DC | PRN

## 2018-06-18 MED ORDER — NEPRO/CARBSTEADY PO LIQD
237.0000 mL | Freq: Two times a day (BID) | ORAL | Status: DC
  Administered 2018-06-18: 237 mL via ORAL
  Filled 2018-06-18: qty 237

## 2018-06-18 MED ORDER — PENTAFLUOROPROP-TETRAFLUOROETH EX AERO: 1.0000 "application " | INHALATION_SPRAY | CUTANEOUS | Status: DC | PRN

## 2018-06-18 MED ORDER — LIDOCAINE HCL (PF) 1 % IJ SOLN: 5.0000 mL | INTRAMUSCULAR | Status: DC | PRN

## 2018-06-18 MED ORDER — CALCITRIOL 0.5 MCG PO CAPS: 2.0000 ug | ORAL_CAPSULE | Freq: Every day | ORAL | Status: DC

## 2018-06-18 MED ORDER — CHLORHEXIDINE GLUCONATE CLOTH 2 % EX PADS: 6.0000 | MEDICATED_PAD | Freq: Every day | CUTANEOUS | Status: DC

## 2018-06-18 MED ORDER — HEPARIN SODIUM (PORCINE) 1000 UNIT/ML DIALYSIS: 1000.0000 [IU] | INTRAMUSCULAR | Status: DC | PRN

## 2018-06-18 NOTE — Consult Note (Signed)
[facility] Renal Consultation Note    Indication for Consultation:  Management of ESRD/hemodialysis; anemia, hypertension/volume and secondary hyperparathyroidism  HPI: Logan Martin is a 63 y.o. 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 Jimmy Footman for his monthly visit yesterday and advised to lower edw 0.5 q HD until a new edw of 49 is achieved.  He was told if SOB worsened he should come to the ED which he did.  His last HD was Monday 1/20.  He has relatively new diagnosed bladder cancer and is undergoing work up at [facility].  Definitive treatment plan not yet made.  Evaluation in the ED showed stable hgb 9.6 WBC 4.9 no diff plts 96 K 4 BUN 50/Cr 7.9 CO2 25 Ca 9.5 Na 141 CXR showed CHF an small pleural effusions.  CT angio confirmed CXR findings without evidence of PE.  Trop 0.24  Remarkably. O2 sats were good on room air. He has no N, V, D, fever or chills. He has significant LE edema which he asserts he has never had and also orthopnea.  Appetite is not noticeably diminished.  Past Medical History:  Diagnosis Date  . Alcohol abuse    quit date in 1995  . Anemia   . Chronic low back pain   . Cocaine abuse in remission ([place])    Quit date in 1995  . 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 Started HD in [place], [place].  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  On NxStage home hemo since 2010. Using RUA AVF.    [name] ESRD on dialysis ([place])    "TTS; [place]" (06/14/2016)  . Hepatitis C   . History of blood transfusion 2007 X 1  . History of kidney stones   . Hypertension   . Peripheral vascular disease ([place])   . Pulmonary embolism [facility]) Feb 2012   Treated with coumadin x 1 year  . Renal insufficiency    Past Surgical History:  Procedure Laterality Date   . A/V FISTULAGRAM N/A 11/22/2017   Procedure: A/V Fistulagram;  Surgeon: Serafina Mitchell, MD;  Location: [facility];  Service: Cardiovascular;  Laterality: N/A;  rt. arm   . ABDOMINAL HERNIA REPAIR  ~ 2000 X 2; 2001 X1  . AV FISTULA PLACEMENT Right 2007   upper arm  . AV FISTULA REPAIR Right ~ 03/2016 & 04/2016  . BLADDER AUGMENTATION  1975  . CORONARY ANGIOPLASTY WITH STENT PLACEMENT  1999  . CYSTOSCOPY W/ URETERAL STENT PLACEMENT N/A 10/13/2017   Procedure: CYSTOSCOPY WITH IRRIGATION OF BLADDER PLACEMENT OF FOLEY;  Surgeon: Ceasar Mons, MD;  Location: [facility];  Service: Urology;  Laterality: N/A;  . CYSTOSCOPY WITH FULGERATION N/A 03/27/2018   Procedure: [place] AND MUCOUS EVACUATION, BLADDER BIOPSY;  Surgeon: Franchot Gallo, MD;  Location: [facility];  Service: Urology;  Laterality: N/A;  . EXTRACORPOREAL SHOCK WAVE LITHOTRIPSY    . HERNIA REPAIR    . INGUINAL HERNIA REPAIR Right 1975  . PERIPHERAL VASCULAR BALLOON ANGIOPLASTY  11/22/2017   Procedure: PERIPHERAL VASCULAR BALLOON ANGIOPLASTY;  Surgeon: Serafina Mitchell, MD;  Location: [facility];  Service: Cardiovascular;;  rt. arm fistula  . PERIPHERAL VASCULAR CATHETERIZATION N/A 06/22/2016   Procedure: Left Arm  Venography;  Surgeon: Elam Dutch, MD;  Location: [facility];  Service: Cardiovascular;  Laterality: N/A;  . PERIPHERAL VASCULAR CATHETERIZATION N/A 06/22/2016   Procedure: A/V Fistulagram - Right Arm;  Surgeon: Elam Dutch, MD;  Location: [facility];  Service: Cardiovascular;  Laterality: N/A;  . PERIPHERAL VASCULAR CATHETERIZATION Right 06/22/2016   Procedure: Peripheral Vascular Balloon Angioplasty;  Surgeon: Elam Dutch, MD;  Location: [facility];  Service: Cardiovascular;  Laterality: Right;  arm fistula   Family History  Problem Relation Age of Onset  . Cancer Mother   . Diabetes Mother   . Hypertension Mother   . Deep vein thrombosis  Sister   . Deep vein thrombosis Daughter    Social History:  reports that he quit smoking about 25 years ago. His smoking use included cigarettes. He has a 5.00 pack-year smoking history. He has never used smokeless tobacco. He reports previous alcohol use. He reports current drug use. Drugs: Marijuana and Cocaine. Allergies  Allergen Reactions  . Heparin Rash    Pork products   . Pork-Derived Products Rash  . Sulfa Antibiotics Rash   Prior to Admission medications   Medication Sig Start Date End Date Taking? Authorizing Provider  calcitRIOL (ROCALTROL) 0.5 MCG capsule Take 1.5 mcg by mouth daily. 10/25/17  Yes [provider]  calcium acetate (PHOSLO) 667 MG capsule Take 1,334 mg by mouth 3 (three) times daily with meals.    Yes [provider]  hydrALAZINE (APRESOLINE) 50 MG tablet Take 50 mg by mouth 3 (three) times daily. 05/11/16  Yes [provider]  labetalol (NORMODYNE) 200 MG tablet Take 200 mg by mouth 2 (two) times daily. 05/11/16  Yes [provider]  lidocaine-prilocaine (EMLA) cream Apply 1 application topically See admin instructions. APPLY SMALL AMOUNT TO ACCESS SITE (AVF) 1 HOUR BEFORE DIALYSIS. COVER WITH OCCLUSIVE DRESSING (SARAN WRAP) 11/14/17  Yes [provider]  Multiple Vitamins-Minerals (MULTIVITAMIN PO) Take 1 capsule by mouth daily.   Yes [provider]  oxycodone (ROXICODONE) 30 MG immediate release tablet Take 30 mg by mouth 3 (three) times daily.   Yes [provider]  Oxycodone HCl 10 MG TABS Take 10 mg by mouth 3 (three) times daily as needed (for pain).  05/22/16  Yes [provider]  promethazine (PHENERGAN) 12.5 MG tablet Take 1 tablet (12.5 mg total) by mouth every 6 (six) hours as needed for nausea or vomiting. Patient not taking: Reported on 06/17/2018 03/28/18   Dorothey Baseman, MD   Current Facility-Administered Medications  Medication Dose Route Frequency Provider Last Rate Last  Dose  . 0.9 %  sodium chloride infusion  100 mL Intravenous PRN Alric Seton, PA-C      . 0.9 %  sodium chloride infusion  100 mL Intravenous PRN Alric Seton, PA-C      . acetaminophen (TYLENOL) tablet 650 mg  650 mg Oral Q6H PRN Ivor Costa, MD       Or  . acetaminophen (TYLENOL) suppository 650 mg  650 mg Rectal Q6H PRN Ivor Costa, MD      . alteplase (CATHFLO ACTIVASE) injection 2 mg  2 mg Intracatheter Once PRN Alric Seton, PA-C      . aspirin EC tablet 81 mg  81 mg Oral Daily Ivor Costa, MD   81 mg at 06/18/18 2683  . calcitRIOL (ROCALTROL) capsule 1.5 mcg  1.5 mcg Oral Daily Ivor Costa, MD   1.5 mcg at 06/18/18 4196  .  calcium acetate (PHOSLO) capsule 1,334 mg  1,334 mg Oral TID WC Ivor Costa, MD   1,334 mg at 06/18/18 0929  . Chlorhexidine Gluconate Cloth 2 % PADS 6 each  6 each Topical Q0600 Alric Seton, PA-C   6 each at 06/18/18 [phone]  . dextromethorphan-guaiFENesin (MUCINEX DM) 30-600 MG per 12 hr tablet 1 tablet  1 tablet Oral BID PRN Ivor Costa, MD      . heparin injection 1,000 Units  1,000 Units Dialysis PRN Alric Seton, PA-C      . hydrALAZINE (APRESOLINE) injection 5 mg  5 mg Intravenous Q2H PRN Ivor Costa, MD      . hydrALAZINE (APRESOLINE) tablet 50 mg  50 mg Oral TID Ivor Costa, MD   50 mg at 06/18/18 1093  . hydrOXYzine (ATARAX/VISTARIL) tablet 10 mg  10 mg Oral TID PRN Ivor Costa, MD      . labetalol (NORMODYNE) tablet 200 mg  200 mg Oral BID Ivor Costa, MD   200 mg at 06/18/18 0930  . levalbuterol (XOPENEX) nebulizer solution 1.25 mg  1.25 mg Nebulization Q6H PRN Eliseo Squires, Jessica U, DO      . lidocaine (PF) (XYLOCAINE) 1 % injection 5 mL  5 mL Intradermal PRN Alric Seton, PA-C      . lidocaine-prilocaine (EMLA) cream 1 application  1 application Topical PRN Ivor Costa, MD      . lidocaine-prilocaine (EMLA) cream 1 application  1 application Topical PRN Alric Seton, PA-C      . morphine 2 MG/ML injection 2 mg  2 mg Intravenous Q4H PRN Ivor Costa, MD       . multivitamin liquid 15 mL  15 mL Oral Daily Ivor Costa, MD   15 mL at 06/18/18 2355  . nitroGLYCERIN (NITROSTAT) SL tablet 0.4 mg  0.4 mg Sublingual Q5 min PRN Ivor Costa, MD      . oxyCODONE (Oxy IR/ROXICODONE) immediate release tablet 10 mg  10 mg Oral TID PRN Ivor Costa, MD      . pentafluoroprop-tetrafluoroeth (GEBAUERS) aerosol 1 application  1 application Topical PRN Alric Seton, PA-C      . senna-docusate (Senokot-S) tablet 1 tablet  1 tablet Oral QHS PRN Ivor Costa, MD      . sodium chloride flush (NS) 0.9 % injection 3 mL  3 mL Intravenous Once Ivor Costa, MD      . zolpidem (AMBIEN) tablet 5 mg  5 mg Oral QHS PRN Ivor Costa, MD       Labs: Basic Metabolic Panel: Recent Labs  Lab 06/17/18 1801  NA 141  K 4.0  CL 103  CO2 25  GLUCOSE 98  BUN 50*  CREATININE 7.90*  CALCIUM 9.5   Liver Function Tests: No results for input(s): AST, ALT, ALKPHOS, BILITOT, PROT, ALBUMIN in the last 168 hours. No results for input(s): LIPASE, AMYLASE in the last 168 hours. No results for input(s): AMMONIA in the last 168 hours. CBC: Recent Labs  Lab 06/17/18 1801  WBC 4.9  HGB 9.6*  HCT 32.3*  MCV 116.2*  PLT 96*   Cardiac Enzymes: Recent Labs  Lab 06/17/18 1800 06/18/18 0256 06/18/18 0947  TROPONINI 0.24* 0.26* 0.25*   CBG: Recent Labs  Lab 06/17/18 1709  GLUCAP 90   Iron Studies: No results for input(s): IRON, TIBC, TRANSFERRIN, FERRITIN in the last 72 hours. Studies/Results: Ct Angio Chest Pe W And/or Wo Contrast  Result Date: 06/17/2018 CLINICAL DATA:  Short of breath for 3 days EXAM: CT ANGIOGRAPHY CHEST  WITH CONTRAST TECHNIQUE: Multidetector CT imaging of the chest was performed using the standard protocol during bolus administration of intravenous contrast. Multiplanar CT image reconstructions and MIPs were obtained to evaluate the vascular anatomy. CONTRAST:  173mL ISOVUE-370 IOPAMIDOL (ISOVUE-370) INJECTION 76% COMPARISON:  11/13/2017 FINDINGS: Cardiovascular:  There are no filling defects in the pulmonary arterial tree to suggest acute pulmonary thromboembolism. Atherosclerotic calcifications of the aortic arch are noted. There is no evidence of aortic aneurysm. The heart is markedly enlarged. Venous stent in the right subclavian and innominate veins is stable. Mediastinum/Nodes: There is no abnormal mediastinal adenopathy. There is fluid density within the mediastinal fat suggesting edema. No pericardial effusion. Small hiatal hernia is suspected. Lungs/Pleura: Lungs are under aerated with small bilateral pleural effusions. There is confluent ground-glass with an upper lobe predilection. There is subsegmental atelectasis in the lingula. Dependent atelectasis posteriorly. No pneumothorax. Upper Abdomen: Multiple benign appearing cysts in liver and kidneys are noted. There is a small amount of free fluid anterior to the liver. Musculoskeletal: No vertebral compression deformity. There is fluid density throughout the subcutaneous fat. Review of the MIP images confirms the above findings. IMPRESSION: No evidence of acute pulmonary thromboembolism. The constellation of findings including pleural effusions, confluent ground-glass, mediastinal edema, and subcutaneous edema suggests volume overload. Small hiatal hernia. Aortic Atherosclerosis (ICD10-I70.0). Electronically Signed   By: Marybelle Killings M.D.   On: 06/17/2018 19:48   Dg Chest Portable 1 View  Result Date: 06/17/2018 CLINICAL DATA:  Generalized weakness and abdominal pain EXAM: PORTABLE CHEST 1 VIEW COMPARISON:  11/13/2017 FINDINGS: The heart is moderately enlarged. Vascular congestion. Bilateral central and basilar hazy airspace opacities are present. Small pleural effusions. Venous stent projects over the right upper lung zone. No pneumothorax. IMPRESSION: The above findings are most consistent with CHF. There is central basilar edema and small pleural effusions Electronically Signed   By: Marybelle Killings M.D.   On:  06/17/2018 17:56   ROS: As per HPI otherwise negative.  Physical Exam: Vitals:   06/18/18 0549 06/18/18 0836 06/18/18 0928 06/18/18 0930  BP: (!) 143/91  (!) 147/92   Pulse: (!) 110 98  (!) 111  Resp: 18 18    Temp: 98.2 F (36.8 C)     TempSrc: Oral     SpO2: 100% 99%    Weight: 53.4 kg     Height:         General: slender chronically ill appearing male Head: NCAT sclera not icteric MMM Neck: Supple.  Lungs: dim BS throughout Heart: tachy reg ~100 Abdomen: soft NT + BS Lower extremities: 2+ pitting edema - SCDs in place  Neuro: A & O  X 3. Moves all extremities spontaneously. Psych:  Responds to questions appropriately with a normal affect. Dialysis Access: right AVF Qb 400  Dialysis Orders:  Home HD Nx Stage since 03/2017 MTTF 3 hr EDW previously 50.5 - told yesterday by Dr. Jimmy Footman to lower 0.5 q HD to new edw of 49 BFR 450 Right upper AVF - last Mircera dose 200 1/21   Recent labs: hgb 8.7 1/6 37% sat ferritin 1649 - on Mircera 200 q 2 weeks - no Fe' Ca/P ok ipTH increasing 693 1/6 on calcitriol just ^ to 2 mcg per day  BP meds labetalol 200 bid and hydralazine 50 tid , Ca acetate 2 ac and 2 w snacks; SBP 130s non orthostatic 1/21  Assessment/Plan: 1. Dyspnea due to CHF from volume excess- BP ok lowering volume - plan on extra tomorrow  if here, though conceivably he could be d/c home to do his own HD at home.  Re-evaluate after HD today Pre HD wt 54.1 -goal 3.5 today. Trop flat 2. ESRD -  On Home HD - allow to cannulate while here - serial HD to lower volume -  3. Anemia  - hgb 9.6 stable - ESA redosed 1/21 4. Metabolic bone disease -  Calcitriol ^ to 2 mcg per day - intolerant of sensipar - continue Ca acetate 5. Nutrition - unintentional weight loss - likely due to cancer renal diet/vits/suppl 6. Bladder cancer dx ~04/2018 work up at [facility] in progress 7. Thrombocytopenia - plts stable - chronic   Myriam Jacobson, PA-C [place]  [phone] 06/18/2018, 11:29 AM

## 2018-06-18 NOTE — Progress Notes (Signed)
Discharge instructions given to patient, all questions answered and concerns addressed. He said he will take his hydralazine when he gets home but did take his binders since he was eating his lunch after getting back from dialysis. Patient educated about the importance of controlling his diet and manage his fluid intake.

## 2018-06-18 NOTE — Progress Notes (Signed)
Patient no longer makes urine. Unable to obtain sample for drug screen.

## 2018-06-18 NOTE — Discharge Summary (Signed)
Physician Discharge Summary  MOSIAH BASTIN Martin:[id number] DOB: [birth year] DOA: 06/17/2018  PCP: [facility]  Admit date: 06/17/2018 Discharge date: 06/18/2018  Admitted From: home Discharge disposition: home   Recommendations for Outpatient Follow-Up:   1. Patient to do serial HDs at home to bring dry weight to 49kg   Discharge Diagnosis:   Principal Problem:   Fluid overload Active Problems:   ESRD on dialysis [facility])   CAD (coronary atherosclerotic disease)   SOB (shortness of breath)   HTN (hypertension)   Thrombocytopenia (HCC)   Anemia in ESRD (end-stage renal disease) ([facility])   Elevated troponin   Volume overload    Discharge Condition: Improved.  Diet recommendation: renal  Wound care: None.  Code status: Full.   History of Present Illness:    Logan Martin is a 63 y.o. male with medical history significant of ESRD-HD (MTTF), hypertension, CAD, thrombocytopenia, alcohol abuse in remission, cocaine abuse in remission, HCV, PVD, PE, bladder cancer, who presents with shortness of breath.  Patient states that he has been having shortness of breath for more than 3 days, which has been progressively getting worse, particularly on exertion.  Patient denies any chest pain.  He has mild dry cough, but no fever or chills.  No tenderness in calf areas.  Patient does not have nausea, vomiting, diarrhea, abdominal pain, symptoms of UTI or unilateral weakness.  Patient states that he had dialysis on Monday, and had 1 L of fluid removed.    Hospital Course by Problem:  Dyspnea due to Fluid overload: -Patient had leg edema -CT angiogram is negative for PE, showed evidence of volume overload -volume removal with HD--> 3 L today -iniitally it was thought patient would be in hospital for >3 days to get serial HDs here but after 1st session, patient felt so much better and preferred to go home and continue HD there for several days in a row and lower  dry weight to 49 -discussed with PA Bergman  ESRD on dialysis (MTTF): -potassium 4.0, bicarbonate 25, creatinine 7.90, BUN 50. -patient improved much faster than expected-- at HD today, they were able to remove >3L of fluid with enough improvement of patient's symptoms to be able to go home with home HD and over the next several days lower weight to new dry weight of 49 kg  Hx of CAD andElevated troponin: -Troponin is elevated at 0.24.  -Patient most likely has demand ischemia due to fluid overload. -no need currently for cardiology consultation at this time  HTN:  -Continue home medications:Hydralazine, labetalol  Thrombocytopenia ([place]): -chronic issue.   Anemia in ESRD (end-stage renal disease) [facility]): -outpatient follow up    Medical Consultants:   renal   Discharge Exam:   Vitals:   06/18/18 1430 06/18/18 1500  BP: 127/61 (!) 120/46  Pulse: 82 (!) 101  Resp:    Temp:    SpO2:     Vitals:   06/18/18 1330 06/18/18 1400 06/18/18 1430 06/18/18 1500  BP: (!) 105/56 125/61 127/61 (!) 120/46  Pulse: 81 78 82 (!) 101  Resp:      Temp:      TempSrc:      SpO2:      Weight:      Height:       .    The results of significant diagnostics from this hospitalization (including imaging, microbiology, ancillary and laboratory) are listed below for reference.     Procedures and  Diagnostic Studies:   Ct Angio Chest Pe W And/or Wo Contrast  Result Date: 06/17/2018 CLINICAL DATA:  Short of breath for 3 days EXAM: CT ANGIOGRAPHY CHEST WITH CONTRAST TECHNIQUE: Multidetector CT imaging of the chest was performed using the standard protocol during bolus administration of intravenous contrast. Multiplanar CT image reconstructions and MIPs were obtained to evaluate the vascular anatomy. CONTRAST:  145mL ISOVUE-370 IOPAMIDOL (ISOVUE-370) INJECTION 76% COMPARISON:  11/13/2017 FINDINGS: Cardiovascular: There are no filling defects in the pulmonary arterial tree to suggest  acute pulmonary thromboembolism. Atherosclerotic calcifications of the aortic arch are noted. There is no evidence of aortic aneurysm. The heart is markedly enlarged. Venous stent in the right subclavian and innominate veins is stable. Mediastinum/Nodes: There is no abnormal mediastinal adenopathy. There is fluid density within the mediastinal fat suggesting edema. No pericardial effusion. Small hiatal hernia is suspected. Lungs/Pleura: Lungs are under aerated with small bilateral pleural effusions. There is confluent ground-glass with an upper lobe predilection. There is subsegmental atelectasis in the lingula. Dependent atelectasis posteriorly. No pneumothorax. Upper Abdomen: Multiple benign appearing cysts in liver and kidneys are noted. There is a small amount of free fluid anterior to the liver. Musculoskeletal: No vertebral compression deformity. There is fluid density throughout the subcutaneous fat. Review of the MIP images confirms the above findings. IMPRESSION: No evidence of acute pulmonary thromboembolism. The constellation of findings including pleural effusions, confluent ground-glass, mediastinal edema, and subcutaneous edema suggests volume overload. Small hiatal hernia. Aortic Atherosclerosis (ICD10-I70.0). Electronically Signed   By: Marybelle Killings M.D.   On: 06/17/2018 19:48   Dg Chest Portable 1 View  Result Date: 06/17/2018 CLINICAL DATA:  Generalized weakness and abdominal pain EXAM: PORTABLE CHEST 1 VIEW COMPARISON:  11/13/2017 FINDINGS: The heart is moderately enlarged. Vascular congestion. Bilateral central and basilar hazy airspace opacities are present. Small pleural effusions. Venous stent projects over the right upper lung zone. No pneumothorax. IMPRESSION: The above findings are most consistent with CHF. There is central basilar edema and small pleural effusions Electronically Signed   By: Marybelle Killings M.D.   On: 06/17/2018 17:56     Labs:   Basic Metabolic Panel: Recent Labs    Lab 06/17/18 1801 06/18/18 1100  NA 141 138  K 4.0 4.6  CL 103 101  CO2 25 23  GLUCOSE 98 107*  BUN 50* 56*  CREATININE 7.90* 8.57*  CALCIUM 9.5 8.9  PHOS  --  4.7*   GFR Estimated Creatinine Clearance: 6.8 mL/min (A) (by C-G formula based on SCr of 8.57 mg/dL (H)). Liver Function Tests: Recent Labs  Lab 06/18/18 1100  ALBUMIN 3.1*   No results for input(s): LIPASE, AMYLASE in the last 168 hours. No results for input(s): AMMONIA in the last 168 hours. Coagulation profile No results for input(s): INR, PROTIME in the last 168 hours.  CBC: Recent Labs  Lab 06/17/18 1801 06/18/18 1100  WBC 4.9 4.0  HGB 9.6* 8.7*  HCT 32.3* 28.9*  MCV 116.2* 110.7*  PLT 96* 82*   Cardiac Enzymes: Recent Labs  Lab 06/17/18 1800 06/18/18 0256 06/18/18 0947  TROPONINI 0.24* 0.26* 0.25*   BNP: Invalid input(s): POCBNP CBG: Recent Labs  Lab 06/17/18 1709  GLUCAP 90   D-Dimer No results for input(s): DDIMER in the last 72 hours. Hgb A1c No results for input(s): HGBA1C in the last 72 hours. Lipid Profile No results for input(s): CHOL, HDL, LDLCALC, TRIG, CHOLHDL, LDLDIRECT in the last 72 hours. Thyroid function studies No results for input(s): TSH, T4TOTAL,  T3FREE, THYROIDAB in the last 72 hours.  Invalid input(s): FREET3 Anemia work up No results for input(s): VITAMINB12, FOLATE, FERRITIN, TIBC, IRON, RETICCTPCT in the last 72 hours. Microbiology Recent Results (from the past 240 hour(s))  MRSA PCR Screening     Status: None   Collection Time: 06/18/18 12:55 AM  Result Value Ref Range Status   MRSA by PCR NEGATIVE NEGATIVE Final    Comment:        The GeneXpert MRSA Assay (FDA approved for NASAL specimens only), is one component of a comprehensive MRSA colonization surveillance program. It is not intended to diagnose MRSA infection nor to guide or monitor treatment for MRSA infections. Performed at [facility], [place] [address], [place], [place] [address]       Discharge Instructions:   Discharge Instructions    Discharge instructions   Complete by:  As directed    Renal diet Do serial home HD sessions to lower dry weight to 49kg   Increase activity slowly   Complete by:  As directed      Allergies as of 06/18/2018      Reactions   Heparin Rash   Pork products    Pork-derived Products Rash   Sulfa Antibiotics Rash      Medication List    STOP taking these medications   promethazine 12.5 MG tablet Commonly known as:  PHENERGAN     TAKE these medications   calcitRIOL 0.5 MCG capsule Commonly known as:  ROCALTROL Take 1.5 mcg by mouth daily.   hydrALAZINE 50 MG tablet Commonly known as:  APRESOLINE Take 50 mg by mouth 3 (three) times daily.   labetalol 200 MG tablet Commonly known as:  NORMODYNE Take 200 mg by mouth 2 (two) times daily.   lidocaine-prilocaine cream Commonly known as:  EMLA Apply 1 application topically See admin instructions. APPLY SMALL AMOUNT TO ACCESS SITE (AVF) 1 HOUR BEFORE DIALYSIS. COVER WITH OCCLUSIVE DRESSING (SARAN WRAP)   MULTIVITAMIN PO Take 1 capsule by mouth daily.   Oxycodone HCl 10 MG Tabs Take 10 mg by mouth 3 (three) times daily as needed (for pain). What changed:  Another medication with the same name was removed. Continue taking this medication, and follow the directions you see here.   PHOSLO 667 MG capsule Generic drug:  calcium acetate Take 1,334 mg by mouth 3 (three) times daily with meals.      Follow-up [facility] Follow up in 1 week(s).   Contact information: [address] [place] [address] [phone]            Time coordinating discharge: 25 min  Signed:  Geradine Girt DO  [facility] 06/18/2018, 3:51 PM

## 2018-06-18 NOTE — Progress Notes (Signed)
CRITICAL VALUE ALERT  Critical Value:  Troponin 0.26  Date & Time Notied:  06/18/18 9741  Provider Notified: Chaney Malling  Orders Received/Actions taken: No new orders at this time

## 2018-06-18 NOTE — Plan of Care (Signed)

## 2018-06-18 NOTE — Progress Notes (Signed)
Patient arrived to unit via [facility]. Patient alert and oriented. Patient updated on plan of care. No other complaints at this time. Will continue to monitor patient.

## 2018-06-18 NOTE — Procedures (Signed)
   I was present at this dialysis session, have reviewed the session itself and made  appropriate changes Kelly Splinter MD [place] pager [phone]   06/18/2018, 12:49 PM

## 2018-06-18 NOTE — Progress Notes (Addendum)
Progress Note    Logan Martin  BJY:[id number] DOB: 10-26-1955  DOA: 06/17/2018 PCP: [facility]    Brief Narrative:     Medical records reviewed and are as summarized below:  Logan Martin is an 63 y.o. male with medical history significant of ESRD-HD (MTTF), hypertension, CAD, thrombocytopenia, alcohol abuse in remission, cocaine abuse in remission, HCV, PVD, PE, bladder cancer, who presents with shortness of breath.  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 -will need several HD session in a row and a lower dry weight prior to d/c- discussed with Dr. Jonnie Finner at bedside  Hx of CAD and Elevated troponin:  -Troponin is elevated at 0.24.  - Patient most likely has demand ischemia due to fluid overload. -no need currently for cardiology consultation  HTN:  -Continue home medications: Hydralazine, labetalol  Thrombocytopenia ([place]):  -chronic issue.   -trend cBC  Anemia in ESRD (end-stage renal disease) ([place]):  -trend CBC  Family Communication/Anticipated D/C date and plan/Code Status   DVT prophylaxis: scd Code Status: Full Code.  Family Communication:  Disposition Plan: will need several days in a row of HD-- will change to inpt   Medical Consultants:    Renal (discussed with Dr. Jonnie Finner-- volume overloaded, needs serial HD sessions 2-3 and a lower dry weight at  d/c)   Subjective:   C/o being cold  Objective:    Vitals:   06/17/18 2340 06/18/18 0057 06/18/18 0549 06/18/18 0836  BP: (!) 131/98  (!) 143/91   Pulse: (!) 115  (!) 110 98  Resp: 18  18 18   Temp: (!) 97 F (36.1 C)  98.2 F (36.8 C)   TempSrc:   Oral   SpO2: 99% 95% 100% 99%  Weight: 53.2 kg  53.4 kg   Height: 5\' 4"  (1.626 m)       Intake/Output Summary (Last 24 hours) at 06/18/2018 0916 Last data filed at 06/18/2018 0100 [place] per 24 hour  Intake 240 ml  Output -  Net 240 ml   Filed Weights   06/17/18 1611 06/17/18 2340 06/18/18 0549  Weight: 49 kg 53.2 kg 53.4 kg    Exam: Sitting on side of bed Chronically ill appearing Diminished breath sounds +LE edema of b/l feet A+Ox3  Data Reviewed:   I have personally reviewed following labs and imaging studies:  Labs: Labs show the following:   Basic Metabolic Panel: Recent Labs  Lab 06/17/18 1801  NA 141  K 4.0  CL 103  CO2 25  GLUCOSE 98  BUN 50*  CREATININE 7.90*  CALCIUM 9.5   GFR Estimated Creatinine Clearance: 7.3 mL/min (A) (by C-G formula based on SCr of 7.9 mg/dL (H)). Liver Function Tests: No results for input(s): AST, ALT, ALKPHOS, BILITOT, PROT, ALBUMIN in the last 168 hours. No results for input(s): LIPASE, AMYLASE in the last 168 hours. No results for  input(s): AMMONIA in the last 168 hours. Coagulation profile No results for input(s): INR, PROTIME in the last 168 hours.  CBC: Recent Labs  Lab 06/17/18 1801  WBC 4.9  HGB 9.6*  HCT 32.3*  MCV 116.2*  PLT 96*   Cardiac Enzymes: Recent Labs  Lab 06/17/18 1800 06/18/18 0256  TROPONINI 0.24* 0.26*   BNP (last 3 results) No results for input(s): PROBNP in the last 8760 hours. CBG: Recent Labs  Lab 06/17/18 1709  GLUCAP 90   D-Dimer: No results for input(s): DDIMER in the last 72 hours. Hgb A1c: No results for input(s): HGBA1C in the last 72 hours. Lipid Profile: No results for input(s): CHOL, HDL, LDLCALC, TRIG,  CHOLHDL, LDLDIRECT in the last 72 hours. Thyroid function studies: No results for input(s): TSH, T4TOTAL, T3FREE, THYROIDAB in the last 72 hours.  Invalid input(s): FREET3 Anemia work up: No results for input(s): VITAMINB12, FOLATE, FERRITIN, TIBC, IRON, RETICCTPCT in the last 72 hours. Sepsis Labs: Recent Labs  Lab 06/17/18 1801  WBC 4.9    Microbiology Recent Results (from the past 240 hour(s))  MRSA PCR Screening     Status: None   Collection Time: 06/18/18 12:55 AM  Result Value Ref Range Status   MRSA by PCR NEGATIVE NEGATIVE Final    Comment:        The GeneXpert MRSA Assay (FDA approved for NASAL specimens only), is one component of a comprehensive MRSA colonization surveillance program. It is not intended to diagnose MRSA infection nor to guide or monitor treatment for MRSA infections. Performed at [facility], [place] [address], [place], [place] [address]     Procedures and diagnostic studies:  Ct Angio Chest Pe W And/or Wo Contrast  Result Date: 06/17/2018 CLINICAL DATA:  Short of breath for 3 days EXAM: CT ANGIOGRAPHY CHEST WITH CONTRAST TECHNIQUE: Multidetector CT imaging of the chest was performed using the standard protocol during bolus administration of intravenous contrast. Multiplanar CT image reconstructions and MIPs were obtained to evaluate the vascular anatomy. CONTRAST:  132mL ISOVUE-370 IOPAMIDOL (ISOVUE-370) INJECTION 76% COMPARISON:  11/13/2017 FINDINGS: Cardiovascular: There are no filling defects in the pulmonary arterial tree to suggest acute pulmonary thromboembolism. Atherosclerotic calcifications of the aortic arch are noted. There is no evidence of aortic aneurysm. The heart is markedly enlarged. Venous stent in the right subclavian and innominate veins is stable. Mediastinum/Nodes: There is no abnormal mediastinal adenopathy. There is fluid density within the mediastinal fat suggesting edema. No pericardial effusion. Small hiatal hernia is  suspected. Lungs/Pleura: Lungs are under aerated with small bilateral pleural effusions. There is confluent ground-glass with an upper lobe predilection. There is subsegmental atelectasis in the lingula. Dependent atelectasis posteriorly. No pneumothorax. Upper Abdomen: Multiple benign appearing cysts in liver and kidneys are noted. There is a small amount of free fluid anterior to the liver. Musculoskeletal: No vertebral compression deformity. There is fluid density throughout the subcutaneous fat. Review of the MIP images confirms the above findings. IMPRESSION: No evidence of acute pulmonary thromboembolism. The constellation of findings including pleural effusions, confluent ground-glass, mediastinal edema, and subcutaneous edema suggests volume overload. Small hiatal hernia. Aortic Atherosclerosis (ICD10-I70.0). Electronically Signed   By: Marybelle Killings M.D.   On: 06/17/2018 19:48   Dg Chest Portable 1 View  Result Date: 06/17/2018 CLINICAL DATA:  Generalized weakness and abdominal pain EXAM: PORTABLE CHEST 1 VIEW COMPARISON:  11/13/2017 FINDINGS: The heart is moderately enlarged. Vascular congestion. Bilateral central and basilar hazy airspace opacities are present. Small pleural  effusions. Venous stent projects over the right upper lung zone. No pneumothorax. IMPRESSION: The above findings are most consistent with CHF. There is central basilar edema and small pleural effusions Electronically Signed   By: Marybelle Killings M.D.   On: 06/17/2018 17:56    Medications:   . aspirin EC  81 mg Oral Daily  . calcitRIOL  1.5 mcg Oral Daily  . calcium acetate  1,334 mg Oral TID WC  . Chlorhexidine Gluconate Cloth  6 each Topical Q0600  . hydrALAZINE  50 mg Oral TID  . labetalol  200 mg Oral BID  . levalbuterol  1.25 mg Nebulization TID  . multivitamin  15 mL Oral Daily  . sodium chloride flush  3 mL Intravenous Once   Continuous Infusions:   LOS: 0 days   Geradine Girt  [facility]   *Please refer to [email or web address] to get updated schedule on who will round on this patient, as hospitalists switch teams weekly.  06/18/2018, 9:16 AM

## 2018-06-23 NOTE — Progress Notes (Signed)
Spoke with Roselyn Reef at Dr. Zettie Pho office. She states that patient will be admitted tomorrow. Requested that she have Dr. Tresa Moore review patient's recent hospitalization.

## 2018-06-24 ENCOUNTER — Inpatient Hospital Stay (HOSPITAL_COMMUNITY): Admission: RE | Admit: 2018-06-24 | Payer: Medicare Other | Source: Home / Self Care | Admitting: Urology

## 2018-06-25 ENCOUNTER — Encounter (HOSPITAL_COMMUNITY): Admission: RE | Payer: Self-pay | Source: Home / Self Care

## 2018-06-25 SURGERY — PROSTATECTOMY
Anesthesia: Choice

## 2018-07-20 DIAGNOSIS — C678 Malignant neoplasm of overlapping sites of bladder: Secondary | ICD-10-CM | POA: Insufficient documentation

## 2018-08-14 DIAGNOSIS — R768 Other specified abnormal immunological findings in serum: Secondary | ICD-10-CM | POA: Insufficient documentation

## 2018-08-14 DIAGNOSIS — Z86711 Personal history of pulmonary embolism: Secondary | ICD-10-CM | POA: Insufficient documentation

## 2018-08-14 DIAGNOSIS — Z8551 Personal history of malignant neoplasm of bladder: Secondary | ICD-10-CM | POA: Insufficient documentation

## 2018-09-08 ENCOUNTER — Ambulatory Visit (INDEPENDENT_AMBULATORY_CARE_PROVIDER_SITE_OTHER): Payer: Medicare Other | Admitting: Cardiology

## 2018-09-08 ENCOUNTER — Encounter: Payer: Self-pay | Admitting: Cardiology

## 2018-09-08 ENCOUNTER — Other Ambulatory Visit: Payer: Self-pay

## 2018-09-08 VITALS — BP 138/81 | HR 83 | Ht 64.0 in | Wt 112.0 lb

## 2018-09-08 DIAGNOSIS — I2510 Atherosclerotic heart disease of native coronary artery without angina pectoris: Secondary | ICD-10-CM

## 2018-09-08 DIAGNOSIS — Z992 Dependence on renal dialysis: Secondary | ICD-10-CM

## 2018-09-08 DIAGNOSIS — N186 End stage renal disease: Secondary | ICD-10-CM

## 2018-09-08 DIAGNOSIS — I340 Nonrheumatic mitral (valve) insufficiency: Secondary | ICD-10-CM

## 2018-09-08 DIAGNOSIS — I119 Hypertensive heart disease without heart failure: Secondary | ICD-10-CM

## 2018-09-08 NOTE — Progress Notes (Signed)
Subjective:  Primary Physician:  [name], NP  Patient ID: Logan Martin, male    DOB: 1955-09-17, 63 y.o.   MRN: [id number]  This visit type was conducted due to national recommendations for restrictions regarding the COVID-19 Pandemic (e.g. social distancing).  This format is felt to be most appropriate for this patient at this time.  All issues noted in this document were discussed and addressed.  No physical exam was performed (except for noted visual exam findings with Telehealth visits - very limited).  The patient has consented to conduct a Telehealth visit and understands insurance will be billed.   I connected with patient, on 09/08/18  by a video enabled telemedicine application and verified that I am speaking with the correct person using two identifiers.     I discussed the limitations of evaluation and management by telemedicine and the availability of in person appointments. The patient expressed understanding and agreed to proceed.   I have discussed with patient regarding the safety during COVID Pandemic and steps and precautions including social distancing with the patient.    Chief Complaint  Patient presents with  . Coronary Artery Disease    preop evaluation for colonoscopy    HPI: Logan Martin  is a 63 y.o. male . History was obtained from the patient and review of records from [facility].  Patient has history of CAD, status post PCI in [place].  Those records are not available.  His cardiac status has remained stable subsequently.  Patient denies any complaints of chest pain, tightness or pressure.  There is no shortness of breath.  His walking has been limited because of chronic back pain.  No orthopnea or PND.  No palpitations, sudden and rapid or irregular heartbeat.  No complaints of dizziness, near-syncope or syncope.  No history of swelling on the legs. He denies any GI bleed or bleeding from any other site.  Patient has hypertension and  hypercholesterolemia.  No history of diabetes mellitus. He does not smoke. He has past h/o alcohol and cocaine abuse.  Patient has ESRD, on regular hemo-dialysis at home. He also has anemia, history of hepatitis C and past history of pulmonary embolism.  There is no history of TIA or CVA.  Patient had radical cystectomy with bilateral lymphadenectomy and radical prostatectomy on 08/11/2018 at [facility] for adenocarcinoma of the bladder.  Past Medical History:  Diagnosis Date  . Alcohol abuse    quit date in 1995  . Anemia   . Chronic low back pain   . Cocaine abuse in remission ([place])    Quit date in 1995  . 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 Started HD in [place].  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  On NxStage home hemo since 2010. Using RUA AVF.    [name] ESRD on dialysis ([place])    "TTS; [facility]" (06/14/2016)  . Hepatitis C   . History of blood transfusion 2007 X 1  . History of kidney stones   . Hypertension   . Peripheral vascular disease ([place])   . Pulmonary embolism [facility]) Feb 2012   Treated with coumadin x 1 year  . Renal insufficiency     Past Surgical History:  Procedure Laterality Date  . A/V FISTULAGRAM N/A 11/22/2017   Procedure: A/V Fistulagram;  Surgeon: Serafina Mitchell, MD;  Location: [facility];  Service: Cardiovascular;  Laterality: N/A;  rt. arm   .  ABDOMINAL HERNIA REPAIR  ~ 2000 X 2; 2001 X1  . AV FISTULA PLACEMENT Right 2007   upper arm  . AV FISTULA REPAIR Right ~ 03/2016 & 04/2016  . BLADDER AUGMENTATION  1975  . CORONARY ANGIOPLASTY WITH STENT PLACEMENT  1999  . CYSTOSCOPY W/ URETERAL STENT PLACEMENT N/A 10/13/2017   Procedure: CYSTOSCOPY WITH IRRIGATION OF BLADDER PLACEMENT OF FOLEY;  Surgeon: Ceasar Mons, MD;  Location: [place];  Service: Urology;  Laterality: N/A;  . CYSTOSCOPY WITH FULGERATION N/A 03/27/2018   Procedure: [place] AND MUCOUS EVACUATION,  BLADDER BIOPSY;  Surgeon: Franchot Gallo, MD;  Location: [facility];  Service: Urology;  Laterality: N/A;  . EXTRACORPOREAL SHOCK WAVE LITHOTRIPSY    . HERNIA REPAIR    . INGUINAL HERNIA REPAIR Right 1975  . PERIPHERAL VASCULAR BALLOON ANGIOPLASTY  11/22/2017   Procedure: PERIPHERAL VASCULAR BALLOON ANGIOPLASTY;  Surgeon: Serafina Mitchell, MD;  Location: [place];  Service: Cardiovascular;;  rt. arm fistula  . PERIPHERAL VASCULAR CATHETERIZATION N/A 06/22/2016   Procedure: Left Arm Venography;  Surgeon: Elam Dutch, MD;  Location: [facility];  Service: Cardiovascular;  Laterality: N/A;  . PERIPHERAL VASCULAR CATHETERIZATION N/A 06/22/2016   Procedure: A/V Fistulagram - Right Arm;  Surgeon: Elam Dutch, MD;  Location: [facility];  Service: Cardiovascular;  Laterality: N/A;  . PERIPHERAL VASCULAR CATHETERIZATION Right 06/22/2016   Procedure: Peripheral Vascular Balloon Angioplasty;  Surgeon: Elam Dutch, MD;  Location: [facility];  Service: Cardiovascular;  Laterality: Right;  arm fistula    Social History   Socioeconomic History  . Marital status: Single    Spouse name: Not on file  . Number of children: Not on file  . Years of education: Not on file  . Highest education level: Not on file  Occupational History  . Not on file  Social Needs  . Financial resource strain: Not on file  . Food insecurity:    Worry: Not on file    Inability: Not on file  . Transportation needs:    Medical: Not on file    Non-medical: Not on file  Tobacco Use  . Smoking status: Former Smoker    Packs/day: 1.00    Years: 5.00    Pack years: 5.00    Types: Cigarettes    Last attempt to quit: 05/28/1993    Years since quitting: 25.2  . Smokeless tobacco: Never Used  Substance and Sexual Activity  . Alcohol use: Not Currently    Comment: 06/14/2016 "nothing since 1995"  . Drug use: Yes    Types: Marijuana, Cocaine    Comment: 06/14/2016 "nothing since 1995"   . Sexual activity: Yes  Lifestyle  . Physical activity:    Days per week: Not on file    Minutes per session: Not on file  . Stress: Not on file  Relationships  . Social connections:    Talks on phone: Not on file    Gets together: Not on file    Attends religious service: Not on file    Active member of club or organization: Not on file    Attends meetings of clubs or organizations: Not on file    Relationship status: Not on file  . Intimate partner violence:    Fear of current or ex partner: Not on file    Emotionally abused: Not on file    Physically abused: Not on file    Forced sexual activity: Not  on file  Other Topics Concern  . Not on file  Social History Narrative  . Not on file    Current Outpatient Medications on File Prior to Visit  Medication Sig Dispense Refill  . aspirin EC 81 MG tablet Take 81 mg by mouth daily.    [name] atorvastatin (LIPITOR) 10 MG tablet Take 10 mg by mouth daily.    . calcitRIOL (ROCALTROL) 0.5 MCG capsule Take 1.5 mcg by mouth daily.  3  . calcium acetate (CALPHRON) 667 MG tablet Take by mouth.    . calcium acetate (PHOSLO) 667 MG capsule Take 1,334 mg by mouth 3 (three) times daily with meals.     . hydrALAZINE (APRESOLINE) 50 MG tablet Take 50 mg by mouth 3 (three) times daily.  3  . isosorbide mononitrate (IMDUR) 30 MG 24 hr tablet Take 30 mg by mouth daily.    [name] levofloxacin (LEVAQUIN) 750 MG tablet Take 1 tablet by mouth daily.    [name] lidocaine-prilocaine (EMLA) cream Apply 1 application topically See admin instructions. APPLY SMALL AMOUNT TO ACCESS SITE (AVF) 1 HOUR BEFORE DIALYSIS. COVER WITH OCCLUSIVE DRESSING (SARAN WRAP)  6  . metoprolol succinate (TOPROL-XL) 50 MG 24 hr tablet Take 50 mg by mouth daily.    . Multiple Vitamins-Minerals (MULTIVITAMIN PO) Take 1 capsule by mouth daily.    . Oxycodone HCl 10 MG TABS Take 10 mg by mouth 3 (three) times daily as needed (for pain).   0   No current facility-administered medications on file  prior to visit.     Review of Systems  Constitutional: Negative for fever.  HENT: Negative for nosebleeds.   Eyes: Negative for blurred vision.  Respiratory: Negative for cough and shortness of breath.   Gastrointestinal: Negative for abdominal pain, nausea and vomiting.  Genitourinary: Negative for dysuria.  Musculoskeletal: Negative for myalgias.  Skin: Negative for itching and rash.  Neurological: Negative for dizziness, seizures and loss of consciousness.  Psychiatric/Behavioral: The patient is not nervous/anxious.          Objective:  Blood pressure 138/81, pulse 83, height 5\' 4"  (1.626 m), weight 112 lb (50.8 kg). Body mass index is 19.22 kg/m.  Physical Exam  Patient is alert and oriented, appears comfortable at the time of examination.  He checks blood pressure daily at home.  It has been satisfactory.  Today, his blood pressure was 138/81.  No further examination was possible as it was a telemetry medicine visit.  CARDIAC STUDIES:  Stress Echocardiogram at [facility] 2020-LVH, resting EF-40%.  There was some augmentation with exercise up to 45-50 percent.  Severe MR ( it is chronic), trivial AR, mild TR, trivial pericardial effusion.  On the stress test, he achieved 7 METS.  Patient felt tired in the legs on walking.  Chest CT scan in January 2020- no acute pulmonary embolism.  Findings of volume overload with pleural effusions.  Assessment & Recommendations:  Atherosclerosis of native coronary artery of native heart without angina pectoris - s/p PCI in [place]. Records not available.  Hypertension with heart disease  Severe mitral regurgitation  ESRD on hemodialysis [facility])  Laboratory Exam:  CBC Latest Ref Rng & Units 06/18/2018 06/17/2018 05/31/2018  WBC 4.0 - 10.5 K/uL 4.0 4.9 5.3  Hemoglobin 13.0 - 17.0 g/dL 8.7(L) 9.6(L) 8.6(L)  Hematocrit 39.0 - 52.0 % 28.9(L) 32.3(L) 28.1(L)  Platelets 150 - 400 K/uL 82(L) 96(L) 83(L)   CMP Latest Ref Rng & Units 06/18/2018  06/17/2018 05/31/2018  Glucose 70 -  99 mg/dL 107(H) 98 90  BUN 8 - 23 mg/dL 56(H) 50(H) 44(H)  Creatinine 0.61 - 1.24 mg/dL 8.57(H) 7.90(H) 7.89(H)  Sodium 135 - 145 mmol/L 138 141 140  Potassium 3.5 - 5.1 mmol/L 4.6 4.0 3.9  Chloride 98 - 111 mmol/L 101 103 103  CO2 22 - 32 mmol/L 23 25 25   Calcium 8.9 - 10.3 mg/dL 8.9 9.5 9.5  Total Protein 6.5 - 8.1 g/dL - - -  Total Bilirubin 0.3 - 1.2 mg/dL - - -  Alkaline Phos 38 - 126 U/L - - -  AST 15 - 41 U/L - - -  ALT 0 - 44 U/L - - -   Lipid Panel  No results found for: CHOL, TRIG, HDL, CHOLHDL, VLDL, LDLCALC, LDLDIRECT   Recommendation:  I have discussed my assessment with the patient.  CAD is stable, he does not have any angina.  Patient also does not have any shortness of breath, orthopnea or PND.  Blood pressure control is satisfactory.  I have advised him to continue present medications for blood pressure and also continue atorvastatin and low-dose aspirin.  He can have colonoscopy from cardiac standpoint.  His cardiac risks for colonoscopy are low and acceptable.  Patient had major urological surgery recently and had tolerated it well. His stress echocardiogram last month did not reveal ischemia.  He does have mildly depressed systolic function.  Secondary prevention was discussed.  He was advised to follow low-salt, low-cholesterol diet.  He was also encouraged to walk regularly as tolerated, he is walking has been limited because of chronic backache.  He will return for cardiac follow-up after 2 months but call us earlier if there are any cardiac problems.   Despina Hick, MD, [facility] 09/08/2018, 7:15 PM [facility] [place] Pager: [phone] Office: [phone] If no answer Cell [phone]

## 2018-11-05 ENCOUNTER — Encounter: Payer: Self-pay | Admitting: *Deleted

## 2018-11-10 ENCOUNTER — Ambulatory Visit: Payer: Medicare Other | Admitting: Cardiology

## 2018-11-28 ENCOUNTER — Emergency Department (HOSPITAL_COMMUNITY)
Admission: EM | Admit: 2018-11-28 | Discharge: 2018-11-28 | Disposition: A | Discharge status: Home / Self Care | Payer: Medicare Other | Attending: Emergency Medicine | Admitting: Emergency Medicine

## 2018-11-28 ENCOUNTER — Encounter (HOSPITAL_COMMUNITY): Payer: Self-pay | Admitting: Emergency Medicine

## 2018-11-28 ENCOUNTER — Other Ambulatory Visit: Payer: Self-pay

## 2018-11-28 DIAGNOSIS — I120 Hypertensive chronic kidney disease with stage 5 chronic kidney disease or end stage renal disease: Secondary | ICD-10-CM | POA: Insufficient documentation

## 2018-11-28 DIAGNOSIS — F1210 Cannabis abuse, uncomplicated: Secondary | ICD-10-CM | POA: Insufficient documentation

## 2018-11-28 DIAGNOSIS — N186 End stage renal disease: Secondary | ICD-10-CM | POA: Diagnosis not present

## 2018-11-28 DIAGNOSIS — F1410 Cocaine abuse, uncomplicated: Secondary | ICD-10-CM | POA: Insufficient documentation

## 2018-11-28 DIAGNOSIS — Z87891 Personal history of nicotine dependence: Secondary | ICD-10-CM | POA: Insufficient documentation

## 2018-11-28 DIAGNOSIS — Z992 Dependence on renal dialysis: Secondary | ICD-10-CM | POA: Insufficient documentation

## 2018-11-28 DIAGNOSIS — R1032 Left lower quadrant pain: Secondary | ICD-10-CM | POA: Diagnosis not present

## 2018-11-28 DIAGNOSIS — K4090 Unilateral inguinal hernia, without obstruction or gangrene, not specified as recurrent: Secondary | ICD-10-CM | POA: Diagnosis not present

## 2018-11-28 LAB — CBC
HCT: 30.4 % — ABNORMAL LOW (ref 39.0–52.0)
Hemoglobin: 9.5 g/dL — ABNORMAL LOW (ref 13.0–17.0)
MCH: 30.5 pg (ref 26.0–34.0)
MCHC: 31.3 g/dL (ref 30.0–36.0)
MCV: 97.7 fL (ref 80.0–100.0)
Platelets: 113 10*3/uL — ABNORMAL LOW (ref 150–400)
RBC: 3.11 MIL/uL — ABNORMAL LOW (ref 4.22–5.81)
RDW: 16.8 % — ABNORMAL HIGH (ref 11.5–15.5)
WBC: 4.7 10*3/uL (ref 4.0–10.5)
nRBC: 0 % (ref 0.0–0.2)

## 2018-11-28 LAB — COMPREHENSIVE METABOLIC PANEL
ALT: 22 U/L (ref 0–44)
AST: 24 U/L (ref 15–41)
Albumin: 3.8 g/dL (ref 3.5–5.0)
Alkaline Phosphatase: 90 U/L (ref 38–126)
Anion gap: 14 (ref 5–15)
BUN: 30 mg/dL — ABNORMAL HIGH (ref 8–23)
CO2: 27 mmol/L (ref 22–32)
Calcium: 9.8 mg/dL (ref 8.9–10.3)
Chloride: 98 mmol/L (ref 98–111)
Creatinine, Ser: 7.9 mg/dL — ABNORMAL HIGH (ref 0.61–1.24)
GFR calc Af Amer: 8 mL/min — ABNORMAL LOW (ref >60)
GFR calc non Af Amer: 7 mL/min — ABNORMAL LOW (ref >60)
Glucose, Bld: 87 mg/dL (ref 70–99)
Potassium: 4.2 mmol/L (ref 3.5–5.1)
Sodium: 139 mmol/L (ref 135–145)
Total Bilirubin: 0.7 mg/dL (ref 0.3–1.2)
Total Protein: 7.2 g/dL (ref 6.5–8.1)

## 2018-11-28 MED ORDER — SODIUM CHLORIDE 0.9% FLUSH: 3.0000 mL | Freq: Once | INTRAVENOUS | Status: DC

## 2018-11-28 NOTE — ED Provider Notes (Signed)
Emergency Department Provider Note   I have reviewed the triage vital signs and the nursing notes.   HISTORY  Chief Complaint Hernia   HPI Logan Martin is a 63 y.o. male past medical history listed below presents to the emergency department with left inguinal hernia.  Patient has had a hernia in this location since March of this year after bladder and prostate surgery.  2 days ago he began having pain and noticed increased size of the mass.  No vomiting.  Patient continues to have bowel movements and pass flatus.  No constant pain.  He describes the sensation more as "irritation."  He takes 30 mg of oxycodone for his chronic back pain.  No fevers or chills.  His bladder surgery was done at [facility] and he was told by the surgeons that after sometime in the past he should establish with a general surgeon to take care of the hernia.   Past Medical History:  Diagnosis Date  . Alcohol abuse    quit date in 1995  . Anemia   . Chronic low back pain   . Cocaine abuse in remission ([place])    Quit date in 1995  . 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 Started HD in [place].  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  On NxStage home hemo since 2010. Using RUA AVF.    [name] ESRD on dialysis ([place])    "TTS; [place]" (06/14/2016)  . Hepatitis C   . History of blood transfusion 2007 X 1  . History of kidney stones   . Hypertension   . Peripheral vascular disease ([place])   . Pulmonary embolism [facility]) Feb 2012   Treated with coumadin x 1 year  . Renal insufficiency     Patient Active Problem List   Diagnosis Date Noted  . Volume overload 06/18/2018  . Fluid overload 06/17/2018  . Elevated troponin 06/17/2018  . Hematuria 03/27/2018  . Hypokalemia 03/27/2018  . Thrombocytopenia ([place]) 03/27/2018  . Anemia in ESRD (end-stage renal disease) ([place]) 03/27/2018  . Bladder tumor 03/27/2018  . 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 HTN (hypertension)  10/14/2017  . Pyelocystitis 10/13/2017  . Pancytopenia ([place]) 06/14/2016  . Left arm pain 06/14/2016  . 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 Mechanical complication of other vascular device, implant, and graft 08/10/2013  . End stage renal disease ([place]) 08/10/2013  . ESRD (end stage renal disease) ([place]) 06/07/2013  . Nausea and vomiting 06/07/2013  . SOB (shortness of breath) 05/10/2013  . Chest discomfort 11/27/2012  . Shortness of breath 11/27/2012  . Chronic hepatitis C without hepatic coma ([place]) 02/04/2012  . Secondary hyperparathyroidism (of renal origin) 02/04/2012  . Anemia associated with chronic renal failure 02/04/2012  . CAD (coronary atherosclerotic disease) 02/04/2012  . Pulmonary embolism ([place]) 07/20/2010  . CORONARY ARTERY DISEASE 02/23/2009  . HEMORRHOIDS, INTERNAL 02/23/2009  . GERD 02/23/2009  . ESRD on dialysis [facility]) 02/16/2009    Past Surgical History:  Procedure Laterality Date  . A/V FISTULAGRAM N/A 11/22/2017   Procedure: A/V Fistulagram;  Surgeon: Serafina Mitchell, MD;  Location: [place] [facility];  Service: Cardiovascular;  Laterality: N/A;  rt. arm   . ABDOMINAL HERNIA REPAIR  ~ 2000 X 2; 2001 X1  . AV FISTULA PLACEMENT Right 2007   upper arm  . AV FISTULA REPAIR Right ~ 03/2016 & 04/2016  . BLADDER AUGMENTATION  1975  . CORONARY ANGIOPLASTY WITH  [place]  . CYSTOSCOPY W/ URETERAL STENT PLACEMENT N/A 10/13/2017   Procedure: CYSTOSCOPY WITH IRRIGATION OF BLADDER PLACEMENT OF FOLEY;  Surgeon: Ceasar Mons, MD;  Location: [place];  Service: Urology;  Laterality: N/A;  . CYSTOSCOPY WITH FULGERATION N/A 03/27/2018   Procedure: [place] AND MUCOUS EVACUATION, BLADDER BIOPSY;  Surgeon: Franchot Gallo, MD;  [facility];  Service: Urology;  Laterality: N/A;  . EXTRACORPOREAL SHOCK WAVE LITHOTRIPSY    . HERNIA REPAIR    . INGUINAL HERNIA REPAIR Right 1975  . PERIPHERAL VASCULAR BALLOON ANGIOPLASTY   11/22/2017   Procedure: PERIPHERAL VASCULAR BALLOON ANGIOPLASTY;  Surgeon: Serafina Mitchell, MD;  Location: [place] [facility];  Service: Cardiovascular;;  rt. arm fistula  . PERIPHERAL VASCULAR CATHETERIZATION N/A 06/22/2016   Procedure: Left Arm Venography;  Surgeon: Elam Dutch, MD;  Location: [place] [facility];  Service: Cardiovascular;  Laterality: N/A;  . PERIPHERAL VASCULAR CATHETERIZATION N/A 06/22/2016   Procedure: A/V Fistulagram - Right Arm;  Surgeon: Elam Dutch, MD;  Location: [place] [facility];  Service: Cardiovascular;  Laterality: N/A;  . PERIPHERAL VASCULAR CATHETERIZATION Right 06/22/2016   Procedure: Peripheral Vascular Balloon Angioplasty;  Surgeon: Elam Dutch, MD;  Location: [place] [facility];  Service: Cardiovascular;  Laterality: Right;  arm fistula    Allergies Heparin, Pork-derived products, and Sulfa antibiotics  Family History  Problem Relation Age of Onset  . Cancer Mother   . Diabetes Mother   . Hypertension Mother   . Deep vein thrombosis Daughter     Social History Social History   Tobacco Use  . Smoking status: Former Smoker    Packs/day: 1.00    Years: 5.00    Pack years: 5.00    Types: Cigarettes    Quit date: 05/28/1993    Years since quitting: 25.5  . Smokeless tobacco: Never Used  Substance Use Topics  . Alcohol use: Not Currently    Comment: 06/14/2016 "nothing since 1995"  . Drug use: Yes    Types: Marijuana, Cocaine    Comment: 06/14/2016 "nothing since 1995"    Review of Systems  Constitutional: No fever/chills Eyes: No visual changes. ENT: No sore throat. Cardiovascular: Denies chest pain. Respiratory: Denies shortness of breath. Gastrointestinal: Positive LLQ abdominal pain with hernia.  No nausea, no vomiting.  No diarrhea.  No constipation. Genitourinary: Negative for dysuria. Musculoskeletal: Negative for back pain. Skin: Negative for rash. Neurological: Negative for headaches, focal weakness or numbness.   10-point ROS otherwise negative.  ____________________________________________   PHYSICAL EXAM:  VITAL SIGNS: ED Triage Vitals  Enc Vitals Group     BP 11/28/18 1302 116/90     Pulse Rate 11/28/18 1302 83     Resp 11/28/18 1302 16     Temp 11/28/18 1302 98.2 F (36.8 C)     Temp Source 11/28/18 1302 Oral     SpO2 11/28/18 1302 100 %     Pain Score 11/28/18 1332 10   Constitutional: Alert and oriented. Well appearing and in no acute distress. Eyes: Conjunctivae are normal. Head: Atraumatic. Nose: No congestion/rhinnorhea. Mouth/Throat: Mucous membranes are moist.  Neck: No stridor.   Cardiovascular: Normal rate, regular rhythm. Respiratory: Normal respiratory effort.  Gastrointestinal: Soft and nontender.  Small left inguinal hernia palpated.  The hernia is soft and easily reducible without pain medication or sedation.  No concern for incarceration. No distention.  Musculoskeletal: No gross deformities of extremities. Neurologic:  Normal speech and language.  Skin:  Skin is warm, dry and intact. No rash noted.  ____________________________________________   LABS (all labs ordered are listed, but only abnormal results are displayed)  Labs Reviewed  COMPREHENSIVE METABOLIC PANEL - Abnormal; Notable for the following components:      Result Value   BUN 30 (*)    Creatinine, Ser 7.90 (*)    GFR calc non Af Amer 7 (*)    GFR calc Af Amer 8 (*)    All other components within normal limits  CBC - Abnormal; Notable for the following components:   RBC 3.11 (*)    Hemoglobin 9.5 (*)    HCT 30.4 (*)    RDW 16.8 (*)    Platelets 113 (*)    All other components within normal limits   ____________________________________________   PROCEDURES  Procedure(s) performed:   Procedures  None  ____________________________________________   INITIAL IMPRESSION / ASSESSMENT AND PLAN / ED COURSE  Pertinent labs & imaging results that were available during my care of the  patient were reviewed by me and considered in my medical decision making (see chart for details).   Patient presents to the emergency department with left inguinal hernia.  The hernia is easily reducible at bedside.  There is no clinical signs or symptoms to suspect incarceration or bowel obstruction.  No indication for emergency imaging at this time.  I discussed with the patient he will need to establish care with the local general surgery practice to have this repaired as an outpatient.  Contact information provided.  Discussed ED return precautions in detail.    ____________________________________________  FINAL CLINICAL IMPRESSION(S) / ED DIAGNOSES  Final diagnoses:  Left inguinal hernia    MEDICATIONS GIVEN DURING THIS VISIT:  Medications  sodium chloride flush (NS) 0.9 % injection 3 mL (has no administration in time range)     Note:  This document was prepared using Dragon voice recognition software and may include unintentional dictation errors.  Nanda Quinton, MD Emergency Medicine    , [facility], MD 11/28/18 [phone]

## 2018-11-28 NOTE — ED Triage Notes (Signed)
Pt here for eval of of hernia to left groin that he has had since his surgery on his prostate/bladder.

## 2018-11-28 NOTE — Discharge Instructions (Signed)
As we discussed, your suffering from an intermittent inguinal hernia.  Please read through the included discharge instructions.  Although you do not need urgent or emergent surgery, it is very important that you follow-up with a surgeon as indicated in these documents to discuss definitive treatment of this condition with a surgical procedure.  Please apply pressure to the affected area with your hand when you are getting ready to cough, sneeze, get out of bed, or otherwise strain.  This may help prevent the hernia from "popping out".  He should also look at your local pharmacy to see if they have a binder or other holster-like device that applies constant pressure to the area.  Take regular over-the-counter pain medicine as indicated on the labels and any prescriptions provided today.  Follow-up as indicated in these documents.  Return to the emergency department if he develop new or worsening pain or if you are concerned that the hernia has come out and is stuck, resulting in nausea and vomiting, inability to have bowel movement or passed gas, fever or chills, or other symptoms that concern you.

## 2018-12-22 DIAGNOSIS — E7800 Pure hypercholesterolemia, unspecified: Secondary | ICD-10-CM | POA: Insufficient documentation

## 2018-12-22 DIAGNOSIS — I340 Nonrheumatic mitral (valve) insufficiency: Secondary | ICD-10-CM | POA: Insufficient documentation

## 2018-12-24 ENCOUNTER — Ambulatory Visit (INDEPENDENT_AMBULATORY_CARE_PROVIDER_SITE_OTHER): Payer: Medicare Other | Admitting: Cardiology

## 2018-12-24 ENCOUNTER — Encounter: Payer: Self-pay | Admitting: Cardiology

## 2018-12-24 ENCOUNTER — Other Ambulatory Visit: Payer: Self-pay

## 2018-12-24 VITALS — Ht 64.0 in | Wt 106.0 lb

## 2018-12-24 DIAGNOSIS — E7800 Pure hypercholesterolemia, unspecified: Secondary | ICD-10-CM

## 2018-12-24 DIAGNOSIS — I119 Hypertensive heart disease without heart failure: Secondary | ICD-10-CM | POA: Diagnosis not present

## 2018-12-24 DIAGNOSIS — I340 Nonrheumatic mitral (valve) insufficiency: Secondary | ICD-10-CM

## 2018-12-24 DIAGNOSIS — I2510 Atherosclerotic heart disease of native coronary artery without angina pectoris: Secondary | ICD-10-CM

## 2018-12-24 NOTE — Progress Notes (Signed)
Subjective:  Primary Physician:  [name], NP  Patient ID: Logan Martin, male    DOB: August 29, 1955, 63 y.o.   MRN: [id number]  This visit type was conducted due to national recommendations for restrictions regarding the COVID-19 Pandemic (e.g. social distancing).  This format is felt to be most appropriate for this patient at this time.  All issues noted in this document were discussed and addressed.  No physical exam was performed (except for noted visual exam findings with Telehealth visits - very limited).  The patient has consented to conduct a Telehealth visit and understands insurance will be billed.   I connected with patient, on 12/24/18  by a telemedicine application and verified that I am speaking with the correct person using two identifiers.     I discussed the limitations of evaluation and management by telemedicine and the availability of in person appointments. The patient expressed understanding and agreed to proceed.   I have discussed with patient regarding the safety during COVID Pandemic and steps and precautions including social distancing with the patient.    Chief Complaint  Patient presents with  . Coronary Artery Disease  . Hypertension    HPI: Logan Martin  is a 63 y.o. male. Patient has history of CAD, status post PCI in [place].  Those records are not available.  His cardiac status has remained stable subsequently.  Patient denies any complaints of chest pain, tightness or pressure.  There is no shortness of breath.  His walking has been limited because of chronic back pain.  No orthopnea or PND.  No palpitations, sudden and rapid or irregular heartbeat.  No complaints of dizziness, near-syncope or syncope.  No history of swelling on the legs. He denies any GI bleed or bleeding from any other site.  Patient has hypertension and hypercholesterolemia.  No history of diabetes mellitus. He does not smoke. He has past h/o alcohol and cocaine abuse.   Patient has ESRD, on regular hemo-dialysis at home 4 days aweek. He also has anemia, history of hepatitis C and past history of pulmonary embolism.  There is no history of TIA or CVA.  Patient had radical cystectomy with bilateral lymphadenectomy and radical prostatectomy on 08/11/2018 at [facility] for adenocarcinoma of the bladder.  Past Medical History:  Diagnosis Date  . Alcohol abuse    quit date in 1995  . Anemia   . Chronic low back pain   . Cocaine abuse in remission ([place])    Quit date in 1995  . 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 Started HD in [place], [place].  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  On NxStage home hemo since 2010. Using RUA AVF.    [name] ESRD on dialysis ([place])    "TTS; [place]" (06/14/2016)  . Hepatitis C   . History of blood transfusion 2007 X 1  . History of kidney stones   . Hypertension   . Peripheral vascular disease ([place])   . Pulmonary embolism [facility]) Feb 2012   Treated with coumadin x 1 year  . Renal insufficiency     Past Surgical History:  Procedure Laterality Date  . A/V FISTULAGRAM N/A 11/22/2017   Procedure: A/V Fistulagram;  Surgeon: Serafina Mitchell, MD;  Location: [place] [facility];  Service: Cardiovascular;  Laterality: N/A;  rt. arm   . ABDOMINAL HERNIA REPAIR  ~ 2000 X 2; 2001 X1  . AV FISTULA PLACEMENT Right 2007  upper arm  . AV FISTULA REPAIR Right ~ 03/2016 & 04/2016  . BLADDER AUGMENTATION  1975  . CORONARY ANGIOPLASTY WITH STENT PLACEMENT  1999  . CYSTOSCOPY W/ URETERAL STENT PLACEMENT N/A 10/13/2017   Procedure: CYSTOSCOPY WITH IRRIGATION OF BLADDER PLACEMENT OF FOLEY;  Surgeon: Ceasar Mons, MD;  Location: [place];  Service: Urology;  Laterality: N/A;  . CYSTOSCOPY WITH FULGERATION N/A 03/27/2018   Procedure: [place] AND MUCOUS EVACUATION, BLADDER BIOPSY;  Surgeon: Franchot Gallo, MD;  Location[facility];  Service: Urology;  Laterality: N/A;  .  EXTRACORPOREAL SHOCK WAVE LITHOTRIPSY    . HERNIA REPAIR    . INGUINAL HERNIA REPAIR Right 1975  . PERIPHERAL VASCULAR BALLOON ANGIOPLASTY  11/22/2017   Procedure: PERIPHERAL VASCULAR BALLOON ANGIOPLASTY;  Surgeon: Serafina Mitchell, MD;  Location: [place] [facility];  Service: Cardiovascular;;  rt. arm fistula  . PERIPHERAL VASCULAR CATHETERIZATION N/A 06/22/2016   Procedure: Left Arm Venography;  Surgeon: Elam Dutch, MD;  Location: [place] [facility];  Service: Cardiovascular;  Laterality: N/A;  . PERIPHERAL VASCULAR CATHETERIZATION N/A 06/22/2016   Procedure: A/V Fistulagram - Right Arm;  Surgeon: Elam Dutch, MD;  Location: [place] [facility];  Service: Cardiovascular;  Laterality: N/A;  . PERIPHERAL VASCULAR CATHETERIZATION Right 06/22/2016   Procedure: Peripheral Vascular Balloon Angioplasty;  Surgeon: Elam Dutch, MD;  Location: [place] [facility];  Service: Cardiovascular;  Laterality: Right;  arm fistula    Social History   Socioeconomic History  . Marital status: Single    Spouse name: Not on file  . Number of children: Not on file  . Years of education: Not on file  . Highest education level: Not on file  Occupational History  . Not on file  Social Needs  . Financial resource strain: Not on file  . Food insecurity    Worry: Not on file    Inability: Not on file  . Transportation needs    Medical: Not on file    Non-medical: Not on file  Tobacco Use  . Smoking status: Former Smoker    Packs/day: 1.00    Years: 5.00    Pack years: 5.00    Types: Cigarettes    Quit date: 05/28/1993    Years since quitting: 25.5  . Smokeless tobacco: Never Used  Substance and Sexual Activity  . Alcohol use: Not Currently    Comment: 06/14/2016 "nothing since 1995"  . Drug use: Yes    Types: Marijuana, Cocaine    Comment: 06/14/2016 "nothing since 1995"  . Sexual activity: Yes  Lifestyle  . Physical activity    Days per week: Not on file    Minutes per session: Not on  file  . Stress: Not on file  Relationships  . Social Herbalist on phone: Not on file    Gets together: Not on file    Attends religious service: Not on file    Active member of club or organization: Not on file    Attends meetings of clubs or organizations: Not on file    Relationship status: Not on file  . Intimate partner violence    Fear of current or ex partner: Not on file    Emotionally abused: Not on file    Physically abused: Not on file    Forced sexual activity: Not on file  Other Topics Concern  . Not on file  Social History Narrative  . Not on file  Current Outpatient Medications on File Prior to Visit  Medication Sig Dispense Refill  . aspirin EC 81 MG tablet Take 81 mg by mouth at bedtime.     [name] atorvastatin (LIPITOR) 10 MG tablet Take 10 mg by mouth daily.    . calcitRIOL (ROCALTROL) 0.5 MCG capsule Take 1.5 mcg by mouth daily.  3  . calcium acetate (PHOSLO) 667 MG capsule Take 667-1,334 mg by mouth See admin instructions. Take two capsules (1334 mg) by mouth three times daily with meals, take one capsule (667 mg) with snacks    . hydrALAZINE (APRESOLINE) 50 MG tablet Take 50 mg by mouth 3 (three) times daily.  3  . isosorbide mononitrate (IMDUR) 30 MG 24 hr tablet Take 30 mg by mouth at bedtime.     . lidocaine-prilocaine (EMLA) cream Apply 1 application topically See admin instructions. Apply small amount to dialysis port (AVF) one hour before dialysis. Cover with occlusive dressing (saran wrap)  6  . Multiple Vitamin (MULTIVITAMIN WITH MINERALS) TABS tablet Take 1 tablet by mouth daily.    [name] oxycodone (ROXICODONE) 30 MG immediate release tablet Take 30 mg by mouth 3 (three) times daily.    . Oxycodone HCl 10 MG TABS Take 10 mg by mouth 3 (three) times daily as needed (breakthrough pain).   0   No current facility-administered medications on file prior to visit.     Review of Systems  Constitutional: Negative for fever.  HENT: Negative for  nosebleeds.   Eyes: Negative for blurred vision.  Respiratory: Negative for cough and shortness of breath.   Cardiovascular: Negative for chest pain, palpitations, claudication and leg swelling.  Gastrointestinal: Negative for abdominal pain, nausea and vomiting.  Genitourinary: Negative for dysuria.  Musculoskeletal: Negative for myalgias.  Skin: Negative for itching and rash.  Neurological: Negative for dizziness, seizures and loss of consciousness.  Psychiatric/Behavioral: The patient is not nervous/anxious.        Objective:  Height 5\' 4"  (1.626 m), weight 106 lb (48.1 kg). Body mass index is 18.19 kg/m.  Physical Exam  Patient is alert and oriented, appeared very comfortable talking to me during the visit. No further detailed physical examination was possible as it was a telemedicine visit.  CARDIAC STUDIES:  Stress Echocardiogram at [facility] 2020-[facility], resting EF-40%.  There was some augmentation with exercise up to 45-50 percent.  Severe MR ( it is chronic), trivial AR, mild TR, trivial pericardial effusion.  On the stress test, he achieved 7 METS.  Patient felt tired in the legs on walking.  Chest CT scan in January 2020- no acute pulmonary embolism.  Findings of volume overload with pleural effusions.  Assessment & Recommendations:  Atherosclerosis of native coronary artery of native heart without angina pectoris   Hypertension with heart disease   Severe mitral regurgitation   Hypercholesterolemia   Laboratory Exam:  CBC Latest Ref Rng & Units 11/28/2018 06/18/2018 06/17/2018  WBC 4.0 - 10.5 K/uL 4.7 4.0 4.9  Hemoglobin 13.0 - 17.0 g/dL 9.5(L) 8.7(L) 9.6(L)  Hematocrit 39.0 - 52.0 % 30.4(L) 28.9(L) 32.3(L)  Platelets 150 - 400 K/uL 113(L) 82(L) 96(L)   CMP Latest Ref Rng & Units 11/28/2018 06/18/2018 06/17/2018  Glucose 70 - 99 mg/dL 87 107(H) 98  BUN 8 - 23 mg/dL 30(H) 56(H) 50(H)  Creatinine 0.61 - 1.24 mg/dL 7.90(H) 8.57(H) 7.90(H)  Sodium 135 - 145 mmol/L 139 138  141  Potassium 3.5 - 5.1 mmol/L 4.2 4.6 4.0  Chloride 98 - 111 mmol/L 98 101  103  CO2 22 - 32 mmol/L 27 23 25   Calcium 8.9 - 10.3 mg/dL 9.8 8.9 9.5  Total Protein 6.5 - 8.1 g/dL 7.2 - -  Total Bilirubin 0.3 - 1.2 mg/dL 0.7 - -  Alkaline Phos 38 - 126 U/L 90 - -  AST 15 - 41 U/L 24 - -  ALT 0 - 44 U/L 22 - -   Lipid Panel  No results found for: CHOL, TRIG, HDL, CHOLHDL, VLDL, LDLCALC, LDLDIRECT   Recommendation:  CAD is stable, he does not have any angina.  Patient also does not have any shortness of breath, orthopnea or PND. He has been checking blood pressure regularly at the home, systolic pressure has been in 737T and diastolic in 06Y.. I have advised him to continue present medications for blood pressure and also continue atorvastatin and low-dose aspirin.  Patient is planning to have inguinal hernia surgery. His cardiac risks for surgery are low and acceptable. His stress echocardiogram in March 2020 did not reveal ischemia. He does have mildly depressed systolic function.  Secondary prevention was again discussed.  He was advised to follow low-salt, low-cholesterol diet.  He was also encouraged to walk regularly as tolerated, he is walking has been limited because of chronic backache.  He will return for cardiac follow-up after 4 months but call us earlier if there are any cardiac problems. Patient has been scheduled for lipid panel.   Despina Hick, MD, [facility] 12/24/2018, 11:17 AM [facility] [place] Pager: [phone] Office: [phone] If no answer Cell [phone]

## 2019-01-17 ENCOUNTER — Other Ambulatory Visit: Payer: Self-pay

## 2019-01-17 DIAGNOSIS — Z20822 Contact with and (suspected) exposure to covid-19: Secondary | ICD-10-CM

## 2019-01-18 ENCOUNTER — Ambulatory Visit: Payer: Self-pay | Admitting: General Surgery

## 2019-01-18 LAB — NOVEL CORONAVIRUS, NAA: SARS-CoV-2, NAA: NOT DETECTED

## 2019-02-03 NOTE — Pre-Procedure Instructions (Signed)
[facility] DRUG STORE [place] AT [facility] OF [address] [place] [address] Phone: [phone] Fax: [phone]  [place] ([place]), [place] - [address] [place] ([place]) [place] [address] Phone: [phone] Fax: [phone]      Your procedure is scheduled on  02-09-19 ,Monday from 1 pm-2pm  Report to [place] Entrance "A" at 11A.M., and check in at the Admitting office.  Call this number if you have problems the morning of surgery:  [phone]  Call [phone] if you have any questions prior to your surgery date Monday-Friday 8am-4pm    Remember:  Do not eat  after midnight the night before your surgery  You may drink clear liquids until 10AM the morning of your surgery.   Clear liquids allowed are: Water, Non-Citrus Juices (without pulp), Carbonated Beverages, Clear Tea, Black Coffee Only, and Gatorade  Please complete your PRE-SURGERY ENSURE that was provided to you by10AM the morning of surgery.  Please, if able, drink it in one setting. DO NOT SIP.   Take these medicines the morning of surgery with A SIP OF WATER : atorvastatin (LIPITOR)  hydrALAZINE (APRESOLINE)  isosorbide mononitrate (IMDUR) labetalol (NORMODYNE) metoprolol succinate (TOPROL-XL) oxycodone (ROXICODONE)  Follow your surgeon's instructions on when to stop Aspirin.  If no instructions were given by your surgeon then you will need to call the office to get those instructions.    7 days prior to surgery STOP taking any Aspirin (unless otherwise instructed by your surgeon), Aleve, Naproxen, Ibuprofen, Motrin, Advil, Goody's, BC's, all herbal medications, fish oil, and all vitamins.    The Morning of Surgery  Do not wear jewelr.  Do not wear lotions, powders, or perfumes/colognes, or deodorant  Do not shave 48 hours prior to surgery.  Men may shave face and neck.  Do not bring  valuables to the hospital.  [facility] is not responsible for any belongings or valuables.  If you are a smoker, DO NOT Smoke 24 hours prior to surgery IF you wear a CPAP at night please bring your mask, tubing, and machine the morning of surgery   Remember that you must have someone to transport you home after your surgery, and remain with you for 24 hours if you are discharged the same day.   Contacts, glasses, hearing aids, dentures or bridgework may not be worn into surgery.    Leave your suitcase in the car.  After surgery it may be brought to your room.  For patients admitted to the hospital, discharge time will be determined by your treatment team.  Patients discharged the day of surgery will not be allowed to drive home.    Special instructions:   [place]- Preparing For Surgery  Before surgery, you can play an important role. Because skin is not sterile, your skin needs to be as free of germs as possible. You can reduce the number of germs on your skin by washing with CHG (chlorahexidine gluconate) Soap before surgery.  CHG is an antiseptic cleaner which kills germs and bonds with the skin to continue killing germs even after washing.    Oral Hygiene is also important to reduce your risk of infection.  Remember - BRUSH YOUR TEETH THE MORNING OF SURGERY WITH YOUR REGULAR TOOTHPASTE  Please do not use if you have an allergy to CHG or antibacterial soaps. If your skin becomes reddened/irritated stop  using the CHG.  Do not shave (including legs and underarms) for at least 48 hours prior to first CHG shower. It is OK to shave your face.  Please follow these instructions carefully.   1. Shower the NIGHT BEFORE SURGERY and the MORNING OF SURGERY with CHG Soap.   2. If you chose to wash your hair, wash your hair first as usual with your normal shampoo.  3. After you shampoo, rinse your hair and body thoroughly to remove the shampoo.  4. Use CHG as you would any other liquid  soap. You can apply CHG directly to the skin and wash gently with a scrungie or a clean washcloth.   5. Apply the CHG Soap to your body ONLY FROM THE NECK DOWN.  Do not use on open wounds or open sores. Avoid contact with your eyes, ears, mouth and genitals (private parts). Wash Face and genitals (private parts)  with your normal soap.   6. Wash thoroughly, paying special attention to the area where your surgery will be performed.  7. Thoroughly rinse your body with warm water from the neck down.  8. DO NOT shower/wash with your normal soap after using and rinsing off the CHG Soap.  9. Pat yourself dry with a CLEAN TOWEL.  10. Wear CLEAN PAJAMAS to bed the night before surgery, wear comfortable clothes the morning of surgery  11. Place CLEAN SHEETS on your bed the night of your first shower and DO NOT SLEEP WITH PETS.    Day of Surgery:  Do not apply any deodorants/lotions. Please shower the morning of surgery with the CHG soap  Please wear clean clothes to the hospital/surgery center.   Remember to brush your teeth WITH YOUR REGULAR TOOTHPASTE.   Please read over the  fact sheets that you were given.

## 2019-02-04 ENCOUNTER — Encounter (HOSPITAL_COMMUNITY): Payer: Self-pay | Admitting: Vascular Surgery

## 2019-02-04 ENCOUNTER — Inpatient Hospital Stay (HOSPITAL_COMMUNITY)
Admission: RE | Admit: 2019-02-04 | Discharge: 2019-02-04 | Disposition: A | Discharge status: Home / Self Care | Payer: Medicare Other | Source: Ambulatory Visit

## 2019-02-04 ENCOUNTER — Encounter (HOSPITAL_COMMUNITY): Payer: Self-pay | Admitting: Certified Registered"

## 2019-02-04 ENCOUNTER — Other Ambulatory Visit: Payer: Self-pay

## 2019-02-04 ENCOUNTER — Encounter (HOSPITAL_COMMUNITY): Payer: Self-pay

## 2019-02-04 NOTE — Pre-Procedure Instructions (Signed)
Pt. missed his 2pm appt today. Is scheduled for COVID test tomorrow. Has made an appt to stop by to get labs drawn tomorrow. Conducted a same day work up call  PCP - [name]  Cardiologist - Dr Tilda Franco  Onc-Dr  [facility]  Chest x-ray - NA  EKG - 06-17-18  Stress Test - 2011 in [place]. Pt states was normal  ECHO - 11-27-12  Cardiac Cath - 06-22-2016  AICD-denies PM-denies LOOP-denies  Sleep Study - denies CPAP - N  LABS-Will be drawn on 09/10  ASA-Pt advised by dr to continue taking Aspirin till day before surgery  ERAS-Pre-surgery Ensure drink will be provided on 09/10  HA1C-denies Fasting Blood Sugar -  Checks Blood Sugar _____ times a day  Anesthesia-Y  Pt denies having chest pain, sob, or fever at this time. All instructions explained to the pt, with a verbal understanding of the material. Pt agrees to go over the instructions while at home for a better understanding. Pt also instructed to self quarantine after being tested for COVID-19. The opportunity to ask questions was provided.

## 2019-02-05 ENCOUNTER — Other Ambulatory Visit: Payer: Self-pay

## 2019-02-05 ENCOUNTER — Other Ambulatory Visit (HOSPITAL_COMMUNITY)
Admission: RE | Admit: 2019-02-05 | Discharge: 2019-02-05 | Disposition: A | Discharge status: Home / Self Care | Payer: Medicare Other | Source: Ambulatory Visit | Attending: General Surgery | Admitting: General Surgery

## 2019-02-05 ENCOUNTER — Encounter (HOSPITAL_COMMUNITY)
Admission: RE | Admit: 2019-02-05 | Discharge: 2019-02-05 | Disposition: A | Discharge status: Home / Self Care | Payer: Medicare Other | Source: Ambulatory Visit | Attending: General Surgery | Admitting: General Surgery

## 2019-02-05 DIAGNOSIS — Z20828 Contact with and (suspected) exposure to other viral communicable diseases: Secondary | ICD-10-CM | POA: Diagnosis not present

## 2019-02-05 DIAGNOSIS — Z01812 Encounter for preprocedural laboratory examination: Secondary | ICD-10-CM | POA: Diagnosis present

## 2019-02-05 LAB — CBC
HCT: 36.4 % — ABNORMAL LOW (ref 39.0–52.0)
Hemoglobin: 10.8 g/dL — ABNORMAL LOW (ref 13.0–17.0)
MCH: 29.7 pg (ref 26.0–34.0)
MCHC: 29.7 g/dL — ABNORMAL LOW (ref 30.0–36.0)
MCV: 100 fL (ref 80.0–100.0)
Platelets: 89 10*3/uL — ABNORMAL LOW (ref 150–400)
RBC: 3.64 MIL/uL — ABNORMAL LOW (ref 4.22–5.81)
RDW: 17.2 % — ABNORMAL HIGH (ref 11.5–15.5)
WBC: 5.8 10*3/uL (ref 4.0–10.5)
nRBC: 0 % (ref 0.0–0.2)

## 2019-02-05 LAB — COMPREHENSIVE METABOLIC PANEL
ALT: 11 U/L (ref 0–44)
AST: 15 U/L (ref 15–41)
Albumin: 3.8 g/dL (ref 3.5–5.0)
Alkaline Phosphatase: 74 U/L (ref 38–126)
Anion gap: 14 (ref 5–15)
BUN: 65 mg/dL — ABNORMAL HIGH (ref 8–23)
CO2: 27 mmol/L (ref 22–32)
Calcium: 9.4 mg/dL (ref 8.9–10.3)
Chloride: 97 mmol/L — ABNORMAL LOW (ref 98–111)
Creatinine, Ser: 8.14 mg/dL — ABNORMAL HIGH (ref 0.61–1.24)
GFR calc Af Amer: 7 mL/min — ABNORMAL LOW (ref >60)
GFR calc non Af Amer: 6 mL/min — ABNORMAL LOW (ref >60)
Glucose, Bld: 62 mg/dL — ABNORMAL LOW (ref 70–99)
Potassium: 4.7 mmol/L (ref 3.5–5.1)
Sodium: 138 mmol/L (ref 135–145)
Total Bilirubin: 0.5 mg/dL (ref 0.3–1.2)
Total Protein: 6.9 g/dL (ref 6.5–8.1)

## 2019-02-05 NOTE — Anesthesia Preprocedure Evaluation (Deleted)
Anesthesia Evaluation    Airway        Dental   Pulmonary former smoker,           Cardiovascular hypertension,      Neuro/Psych    GI/Hepatic   Endo/Other    Renal/GU      Musculoskeletal   Abdominal   Peds  Hematology   Anesthesia Other Findings   Reproductive/Obstetrics                             Anesthesia Physical Anesthesia Plan  ASA:   Anesthesia Plan:    Post-op Pain Management:    Induction:   PONV Risk Score and Plan:   Airway Management Planned:   Additional Equipment:   Intra-op Plan:   Post-operative Plan:   Informed Consent:   Plan Discussed with:   Anesthesia Plan Comments: (Follows with cardiology, Dr. 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 He was seen 12/24/18 and cleared for surgery at that time. Per note: "CAD is stable, he does not have any angina.  Patient also does not have any shortness of breath, orthopnea or PND. He has been checking blood pressure regularly at the home, systolic pressure has been in 299B and diastolic in 71I.. I have advised him to continue present medications for blood pressure and also continue atorvastatin and low-dose aspirin. Patient is planning to have inguinal hernia surgery. His cardiac risks for surgery are low and acceptable. His stress echocardiogram in March 2020 did not reveal ischemia. 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 He does home hemodialysis 4 days/week. Recently s/p radical cystectomy with bilateral lymphadenectomy and radical prostatectomy on 08/11/2018 at [facility] for adenocarcinoma of the bladder. Nephrology care is at [facility].   Stress Echocardiogram at [facility]-March 2020- LVH, resting EF-40%.  There was some augmentation with exercise up to 45-50 percent.  Severe MR ( it is  chronic), trivial AR, mild TR, trivial pericardial effusion. Maximum workload of  7.00 METs was achieved during exercise.)      Anesthesia Quick Evaluation

## 2019-02-06 LAB — NOVEL CORONAVIRUS, NAA (HOSP ORDER, SEND-OUT TO REF LAB; TAT 18-24 HRS): SARS-CoV-2, NAA: NOT DETECTED

## 2019-02-09 ENCOUNTER — Encounter (HOSPITAL_COMMUNITY)
Admission: RE | Disposition: A | Discharge status: Home / Self Care | Payer: Self-pay | Source: Home / Self Care | Attending: General Surgery

## 2019-02-09 ENCOUNTER — Encounter (HOSPITAL_COMMUNITY): Payer: Self-pay | Admitting: Certified Registered"

## 2019-02-09 ENCOUNTER — Ambulatory Visit (HOSPITAL_COMMUNITY)
Admission: RE | Admit: 2019-02-09 | Discharge: 2019-02-09 | Disposition: A | Discharge status: Home / Self Care | Payer: Medicare Other | Attending: General Surgery | Admitting: General Surgery

## 2019-02-09 ENCOUNTER — Other Ambulatory Visit: Payer: Self-pay

## 2019-02-09 DIAGNOSIS — Z5309 Procedure and treatment not carried out because of other contraindication: Secondary | ICD-10-CM | POA: Insufficient documentation

## 2019-02-09 DIAGNOSIS — K4090 Unilateral inguinal hernia, without obstruction or gangrene, not specified as recurrent: Secondary | ICD-10-CM | POA: Insufficient documentation

## 2019-02-09 DIAGNOSIS — E875 Hyperkalemia: Secondary | ICD-10-CM | POA: Diagnosis not present

## 2019-02-09 LAB — POCT I-STAT 4, (NA,K, GLUC, HGB,HCT)
Glucose, Bld: 63 mg/dL — ABNORMAL LOW (ref 70–99)
HCT: 38 % — ABNORMAL LOW (ref 39.0–52.0)
Hemoglobin: 12.9 g/dL — ABNORMAL LOW (ref 13.0–17.0)
Potassium: 6.4 mmol/L (ref 3.5–5.1)
Sodium: 132 mmol/L — ABNORMAL LOW (ref 135–145)

## 2019-02-09 SURGERY — REPAIR, HERNIA, INGUINAL, LAPAROSCOPIC
Anesthesia: General | Laterality: Left

## 2019-02-09 MED ORDER — KETOROLAC TROMETHAMINE 15 MG/ML IJ SOLN: 15.0000 mg | INTRAMUSCULAR | Status: DC

## 2019-02-09 MED ORDER — KETOROLAC TROMETHAMINE 15 MG/ML IJ SOLN
INTRAMUSCULAR | Status: AC
  Filled 2019-02-09: qty 1

## 2019-02-09 MED ORDER — CHLORHEXIDINE GLUCONATE CLOTH 2 % EX PADS: 6.0000 | MEDICATED_PAD | Freq: Once | CUTANEOUS | Status: DC

## 2019-02-09 MED ORDER — CEFAZOLIN SODIUM-DEXTROSE 2-4 GM/100ML-% IV SOLN
INTRAVENOUS | Status: AC
  Filled 2019-02-09: qty 100

## 2019-02-09 MED ORDER — CEFAZOLIN SODIUM-DEXTROSE 2-4 GM/100ML-% IV SOLN: 2.0000 g | INTRAVENOUS | Status: DC

## 2019-02-09 MED ORDER — GABAPENTIN 300 MG PO CAPS
ORAL_CAPSULE | ORAL | Status: AC
  Filled 2019-02-09: qty 1

## 2019-02-09 MED ORDER — ACETAMINOPHEN 500 MG PO TABS: 1000.0000 mg | ORAL_TABLET | ORAL | Status: DC

## 2019-02-09 MED ORDER — FENTANYL CITRATE (PF) 250 MCG/5ML IJ SOLN
INTRAMUSCULAR | Status: AC
  Filled 2019-02-09: qty 5

## 2019-02-09 MED ORDER — ACETAMINOPHEN 500 MG PO TABS
ORAL_TABLET | ORAL | Status: AC
  Filled 2019-02-09: qty 2

## 2019-02-09 MED ORDER — ENSURE PRE-SURGERY PO LIQD: 296.0000 mL | Freq: Once | ORAL | Status: DC

## 2019-02-09 MED ORDER — GABAPENTIN 300 MG PO CAPS: 300.0000 mg | ORAL_CAPSULE | ORAL | Status: DC

## 2019-02-09 MED ORDER — PROPOFOL 10 MG/ML IV BOLUS
INTRAVENOUS | Status: AC
  Filled 2019-02-09: qty 20

## 2019-02-26 ENCOUNTER — Ambulatory Visit: Payer: Self-pay | Admitting: General Surgery

## 2019-02-28 ENCOUNTER — Other Ambulatory Visit (HOSPITAL_COMMUNITY)
Admission: RE | Admit: 2019-02-28 | Discharge: 2019-02-28 | Disposition: A | Discharge status: Home / Self Care | Payer: Medicare Other | Source: Ambulatory Visit | Attending: General Surgery | Admitting: General Surgery

## 2019-02-28 DIAGNOSIS — Z20828 Contact with and (suspected) exposure to other viral communicable diseases: Secondary | ICD-10-CM | POA: Diagnosis not present

## 2019-02-28 DIAGNOSIS — Z01812 Encounter for preprocedural laboratory examination: Secondary | ICD-10-CM | POA: Insufficient documentation

## 2019-03-02 ENCOUNTER — Other Ambulatory Visit: Payer: Self-pay

## 2019-03-02 ENCOUNTER — Encounter (HOSPITAL_COMMUNITY): Payer: Self-pay | Admitting: *Deleted

## 2019-03-02 LAB — NOVEL CORONAVIRUS, NAA (HOSP ORDER, SEND-OUT TO REF LAB; TAT 18-24 HRS): SARS-CoV-2, NAA: NOT DETECTED

## 2019-03-02 NOTE — Progress Notes (Signed)
Pt denies SOB and chest pain. Pt stated that he is under the care of Dr. Posey Pronto, Cardiology. Pt stated that PCP is [name], NP. Pt denies recent labs. Pt stated that last dose of Aspirin was 03/01/19 as instructed by MD. Pt made aware to stop taking vitamins, fish oil and herbal medications. Do not take any NSAIDs ie: Ibuprofen, Advil, Naproxen (Aleve), Motrin, BC and Goody Powder. Pt stated that he does not take Imdur and Metoprolol in the AM. Pt stated that he will hold Oxycodone the morning of surgery to avoid N/V. Pt verbalized understanding of all pre-op instructions. PA, Anesthesiology, asked to review pt history.

## 2019-03-03 ENCOUNTER — Encounter (HOSPITAL_COMMUNITY): Payer: Self-pay | Admitting: Physician Assistant

## 2019-03-03 NOTE — Anesthesia Preprocedure Evaluation (Addendum)
Anesthesia Evaluation  Patient identified by MRN, date of birth, ID band Patient awake    Reviewed: Allergy & Precautions, NPO status , Patient's Chart, lab work & pertinent test results  Airway Mallampati: II  TM Distance: >3 FB Neck ROM: Full    Dental no notable dental hx.    Pulmonary shortness of breath, former smoker,    Pulmonary exam normal breath sounds clear to auscultation       Cardiovascular hypertension, Pt. on medications + CAD and + Peripheral Vascular Disease  Normal cardiovascular exam Rhythm:Regular Rate:Normal     Neuro/Psych negative neurological ROS  negative psych ROS   GI/Hepatic GERD  ,(+) Hepatitis -, C  Endo/Other  negative endocrine ROS  Renal/GU ESRF and DialysisRenal disease  negative genitourinary   Musculoskeletal negative musculoskeletal ROS (+)   Abdominal   Peds negative pediatric ROS (+)  Hematology negative hematology ROS (+) anemia ,   Anesthesia Other Findings   Reproductive/Obstetrics negative OB ROS                            Anesthesia Physical Anesthesia Plan  ASA: IV  Anesthesia Plan: General   Post-op Pain Management:    Induction: Intravenous  PONV Risk Score and Plan: 2 and Ondansetron, Midazolam and Treatment may vary due to age or medical condition  Airway Management Planned: LMA  Additional Equipment:   Intra-op Plan:   Post-operative Plan: Extubation in OR  Informed Consent: I have reviewed the patients History and Physical, chart, labs and discussed the procedure including the risks, benefits and alternatives for the proposed anesthesia with the patient or authorized representative who has indicated his/her understanding and acceptance.     Dental advisory given  Plan Discussed with: CRNA  Anesthesia Plan Comments: (Follows with cardiology, Dr. 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 He was seen 12/24/18 and cleared for surgery at that time. Per note, "CAD is stable, he does not have any angina.  Patient also does not have any shortness of breath, orthopnea or PND. He has been checking blood pressure regularly at the home, systolic pressure has been in 697X and diastolic in 48A.. I have advised him to continue present medications for blood pressure and also continue atorvastatin and low-dose aspirin. Patient is planning to have inguinal hernia surgery. His cardiac risks for surgery are low and acceptable. His stress echocardiogram in March 2020 did not reveal ischemia. 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 He does home hemodialysis 4 days/week. Recently s/p radical cystectomy with bilateral lymphadenectomy and radical prostatectomy on 08/11/2018 at [facility] for adenocarcinoma of the bladder. Nephrology care is at [facility].   Hx of Hep C s/p treatment with Mavyret.  Stress Echocardiogram at [facility]-March 2020- LVH, resting EF-40%.  There was some augmentation with exercise up to 45-50 percent.  Severe MR ( it is chronic), trivial AR, mild TR, trivial pericardial effusion. Maximum workload of  7.00 METs was achieved during exercise.)       Anesthesia Quick Evaluation

## 2019-03-03 NOTE — Progress Notes (Signed)
Anesthesia Chart Review: Follows with cardiology, Dr. 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 He was seen 12/24/18 and cleared for surgery at that time. Per note, "CAD is stable, he does not have any angina.  Patient also does not have any shortness of breath, orthopnea or PND. He has been checking blood pressure regularly at the home, systolic pressure has been in 518F and diastolic in 84K.. I have advised him to continue present medications for blood pressure and also continue atorvastatin and low-dose aspirin. Patient is planning to have inguinal hernia surgery. His cardiac risks for surgery are low and acceptable. His stress echocardiogram in March 2020 did not reveal ischemia. 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 He does home hemodialysis 4 days/week. Recently s/p radical cystectomy with bilateral lymphadenectomy and radical prostatectomy on 08/11/2018 at [facility] for adenocarcinoma of the bladder. Nephrology care is at [facility].   Hx of Hep C s/p treatment with Mavyret.  Stress Echocardiogram at [facility]-March 2020- LVH, resting EF-40%.  There was some augmentation with exercise up to 45-50 percent.  Severe MR ( it is chronic), trivial AR, mild TR, trivial pericardial effusion. Maximum workload of  7.00 METs was achieved during exercise.  Wynonia Musty [facility] Short Stay Center/Anesthesiology Phone [phone] 03/03/2019 10:27 AM

## 2019-03-04 ENCOUNTER — Encounter (HOSPITAL_COMMUNITY): Payer: Self-pay | Admitting: Certified Registered Nurse Anesthetist

## 2019-03-04 ENCOUNTER — Other Ambulatory Visit: Payer: Self-pay

## 2019-03-04 ENCOUNTER — Ambulatory Visit (HOSPITAL_COMMUNITY)
Admission: RE | Admit: 2019-03-04 | Discharge: 2019-03-04 | Disposition: A | Discharge status: Home / Self Care | Payer: Medicare Other | Source: Ambulatory Visit | Attending: General Surgery | Admitting: General Surgery

## 2019-03-04 ENCOUNTER — Ambulatory Visit (HOSPITAL_COMMUNITY): Payer: Medicare Other | Admitting: Physician Assistant

## 2019-03-04 ENCOUNTER — Encounter (HOSPITAL_COMMUNITY)
Admission: RE | Disposition: A | Discharge status: Home / Self Care | Payer: Self-pay | Source: Ambulatory Visit | Attending: General Surgery

## 2019-03-04 DIAGNOSIS — K4090 Unilateral inguinal hernia, without obstruction or gangrene, not specified as recurrent: Secondary | ICD-10-CM | POA: Insufficient documentation

## 2019-03-04 DIAGNOSIS — Z8551 Personal history of malignant neoplasm of bladder: Secondary | ICD-10-CM | POA: Insufficient documentation

## 2019-03-04 DIAGNOSIS — Z79899 Other long term (current) drug therapy: Secondary | ICD-10-CM | POA: Diagnosis not present

## 2019-03-04 DIAGNOSIS — Z955 Presence of coronary angioplasty implant and graft: Secondary | ICD-10-CM | POA: Diagnosis not present

## 2019-03-04 DIAGNOSIS — I2510 Atherosclerotic heart disease of native coronary artery without angina pectoris: Secondary | ICD-10-CM | POA: Insufficient documentation

## 2019-03-04 DIAGNOSIS — Z79891 Long term (current) use of opiate analgesic: Secondary | ICD-10-CM | POA: Insufficient documentation

## 2019-03-04 DIAGNOSIS — N186 End stage renal disease: Secondary | ICD-10-CM | POA: Diagnosis not present

## 2019-03-04 DIAGNOSIS — I5020 Unspecified systolic (congestive) heart failure: Secondary | ICD-10-CM | POA: Diagnosis not present

## 2019-03-04 DIAGNOSIS — Z86711 Personal history of pulmonary embolism: Secondary | ICD-10-CM | POA: Insufficient documentation

## 2019-03-04 DIAGNOSIS — Z87891 Personal history of nicotine dependence: Secondary | ICD-10-CM | POA: Diagnosis not present

## 2019-03-04 DIAGNOSIS — I132 Hypertensive heart and chronic kidney disease with heart failure and with stage 5 chronic kidney disease, or end stage renal disease: Secondary | ICD-10-CM | POA: Diagnosis not present

## 2019-03-04 DIAGNOSIS — Z7982 Long term (current) use of aspirin: Secondary | ICD-10-CM | POA: Diagnosis not present

## 2019-03-04 DIAGNOSIS — I739 Peripheral vascular disease, unspecified: Secondary | ICD-10-CM | POA: Diagnosis not present

## 2019-03-04 HISTORY — DX: Presence of spectacles and contact lenses: Z97.3

## 2019-03-04 HISTORY — DX: Presence of external hearing-aid: Z97.4

## 2019-03-04 HISTORY — DX: Malignant (primary) neoplasm, unspecified: C80.1

## 2019-03-04 HISTORY — PX: INGUINAL HERNIA REPAIR: SHX194

## 2019-03-04 HISTORY — DX: Unilateral inguinal hernia, without obstruction or gangrene, not specified as recurrent: K40.90

## 2019-03-04 LAB — COMPREHENSIVE METABOLIC PANEL
ALT: 16 U/L (ref 0–44)
AST: 27 U/L (ref 15–41)
Albumin: 3.8 g/dL (ref 3.5–5.0)
Alkaline Phosphatase: 91 U/L (ref 38–126)
Anion gap: 16 — ABNORMAL HIGH (ref 5–15)
BUN: 32 mg/dL — ABNORMAL HIGH (ref 8–23)
CO2: 22 mmol/L (ref 22–32)
Calcium: 9.8 mg/dL (ref 8.9–10.3)
Chloride: 99 mmol/L (ref 98–111)
Creatinine, Ser: 6.96 mg/dL — ABNORMAL HIGH (ref 0.61–1.24)
GFR calc Af Amer: 9 mL/min — ABNORMAL LOW (ref >60)
GFR calc non Af Amer: 8 mL/min — ABNORMAL LOW (ref >60)
Glucose, Bld: 88 mg/dL (ref 70–99)
Potassium: 5.1 mmol/L (ref 3.5–5.1)
Sodium: 137 mmol/L (ref 135–145)
Total Bilirubin: 1 mg/dL (ref 0.3–1.2)
Total Protein: 6.8 g/dL (ref 6.5–8.1)

## 2019-03-04 LAB — CBC
HCT: 37.5 % — ABNORMAL LOW (ref 39.0–52.0)
Hemoglobin: 11.8 g/dL — ABNORMAL LOW (ref 13.0–17.0)
MCH: 30.1 pg (ref 26.0–34.0)
MCHC: 31.5 g/dL (ref 30.0–36.0)
MCV: 95.7 fL (ref 80.0–100.0)
Platelets: 93 10*3/uL — ABNORMAL LOW (ref 150–400)
RBC: 3.92 MIL/uL — ABNORMAL LOW (ref 4.22–5.81)
RDW: 17.1 % — ABNORMAL HIGH (ref 11.5–15.5)
WBC: 3.6 10*3/uL — ABNORMAL LOW (ref 4.0–10.5)
nRBC: 0 % (ref 0.0–0.2)

## 2019-03-04 SURGERY — REPAIR, HERNIA, INGUINAL, LAPAROSCOPIC
Anesthesia: General | Site: Groin | Laterality: Left

## 2019-03-04 MED ORDER — OXYCODONE HCL 5 MG/5ML PO SOLN: 5.0000 mg | Freq: Once | ORAL | Status: DC | PRN

## 2019-03-04 MED ORDER — ONDANSETRON HCL 4 MG/2ML IJ SOLN
INTRAMUSCULAR | Status: AC
  Filled 2019-03-04: qty 2

## 2019-03-04 MED ORDER — BUPIVACAINE LIPOSOME 1.3 % IJ SUSP
20.0000 mL | Freq: Once | INTRAMUSCULAR | Status: DC
  Filled 2019-03-04: qty 20

## 2019-03-04 MED ORDER — MIDAZOLAM HCL 2 MG/2ML IJ SOLN
INTRAMUSCULAR | Status: AC
  Administered 2019-03-04: 2 mg via INTRAVENOUS
  Filled 2019-03-04: qty 2

## 2019-03-04 MED ORDER — PROMETHAZINE HCL 25 MG/ML IJ SOLN: 6.2500 mg | INTRAMUSCULAR | Status: DC | PRN

## 2019-03-04 MED ORDER — SUCCINYLCHOLINE CHLORIDE 200 MG/10ML IV SOSY
PREFILLED_SYRINGE | INTRAVENOUS | Status: AC
  Filled 2019-03-04: qty 10

## 2019-03-04 MED ORDER — ACETAMINOPHEN 500 MG PO TABS
ORAL_TABLET | ORAL | Status: AC
  Administered 2019-03-04: 1000 mg via ORAL
  Filled 2019-03-04: qty 2

## 2019-03-04 MED ORDER — PHENYLEPHRINE HCL (PRESSORS) 10 MG/ML IV SOLN
INTRAVENOUS | Status: DC | PRN
  Administered 2019-03-04: 80 ug via INTRAVENOUS
  Administered 2019-03-04: 120 ug via INTRAVENOUS

## 2019-03-04 MED ORDER — OXYCODONE HCL 5 MG PO TABS: 5.0000 mg | ORAL_TABLET | Freq: Once | ORAL | Status: DC | PRN

## 2019-03-04 MED ORDER — ENSURE PRE-SURGERY PO LIQD: 296.0000 mL | Freq: Once | ORAL | Status: DC

## 2019-03-04 MED ORDER — SODIUM CHLORIDE 0.9 % IV SOLN
INTRAVENOUS | Status: DC | PRN
  Administered 2019-03-04: 11:00:00 50 ug/min via INTRAVENOUS

## 2019-03-04 MED ORDER — OXYCODONE HCL 5 MG PO TABS: 5.0000 mg | ORAL_TABLET | Freq: Four times a day (QID) | ORAL | 0 refills | Status: DC | PRN

## 2019-03-04 MED ORDER — CEFAZOLIN SODIUM-DEXTROSE 2-4 GM/100ML-% IV SOLN
2.0000 g | INTRAVENOUS | Status: AC
  Administered 2019-03-04: 10:00:00 2 g via INTRAVENOUS

## 2019-03-04 MED ORDER — MIDAZOLAM HCL 2 MG/2ML IJ SOLN
INTRAMUSCULAR | Status: AC
  Filled 2019-03-04: qty 2

## 2019-03-04 MED ORDER — ROPIVACAINE HCL 5 MG/ML IJ SOLN
INTRAMUSCULAR | Status: DC | PRN
  Administered 2019-03-04: 30 mL via PERINEURAL

## 2019-03-04 MED ORDER — KETOROLAC TROMETHAMINE 15 MG/ML IJ SOLN
INTRAMUSCULAR | Status: AC
  Administered 2019-03-04: 15 mg via INTRAVENOUS
  Filled 2019-03-04: qty 1

## 2019-03-04 MED ORDER — GABAPENTIN 300 MG PO CAPS
300.0000 mg | ORAL_CAPSULE | ORAL | Status: AC
  Administered 2019-03-04: 09:00:00 300 mg via ORAL

## 2019-03-04 MED ORDER — KETOROLAC TROMETHAMINE 15 MG/ML IJ SOLN
15.0000 mg | INTRAMUSCULAR | Status: AC
  Administered 2019-03-04: 09:00:00 15 mg via INTRAVENOUS

## 2019-03-04 MED ORDER — FENTANYL CITRATE (PF) 100 MCG/2ML IJ SOLN
50.0000 ug | Freq: Once | INTRAMUSCULAR | Status: AC
  Administered 2019-03-04: 10:00:00 50 ug via INTRAVENOUS

## 2019-03-04 MED ORDER — PROPOFOL 10 MG/ML IV BOLUS
INTRAVENOUS | Status: AC
  Filled 2019-03-04: qty 20

## 2019-03-04 MED ORDER — HYDROMORPHONE HCL 1 MG/ML IJ SOLN: 0.2500 mg | INTRAMUSCULAR | Status: DC | PRN

## 2019-03-04 MED ORDER — 0.9 % SODIUM CHLORIDE (POUR BTL) OPTIME
TOPICAL | Status: DC | PRN
  Administered 2019-03-04: 1000 mL

## 2019-03-04 MED ORDER — IBUPROFEN 800 MG PO TABS: 800.0000 mg | ORAL_TABLET | Freq: Three times a day (TID) | ORAL | 0 refills | Status: DC | PRN

## 2019-03-04 MED ORDER — EPHEDRINE SULFATE 50 MG/ML IJ SOLN
INTRAMUSCULAR | Status: DC | PRN
  Administered 2019-03-04 (×2): 10 mg via INTRAVENOUS

## 2019-03-04 MED ORDER — MIDAZOLAM HCL 2 MG/2ML IJ SOLN
2.0000 mg | Freq: Once | INTRAMUSCULAR | Status: AC
  Administered 2019-03-04: 10:00:00 2 mg via INTRAVENOUS

## 2019-03-04 MED ORDER — BUPIVACAINE-EPINEPHRINE 0.25% -1:200000 IJ SOLN
INTRAMUSCULAR | Status: AC
  Filled 2019-03-04: qty 1

## 2019-03-04 MED ORDER — LIDOCAINE HCL (CARDIAC) PF 100 MG/5ML IV SOSY
PREFILLED_SYRINGE | INTRAVENOUS | Status: DC | PRN
  Administered 2019-03-04: 60 mg via INTRAVENOUS

## 2019-03-04 MED ORDER — ROCURONIUM BROMIDE 10 MG/ML (PF) SYRINGE
PREFILLED_SYRINGE | INTRAVENOUS | Status: AC
  Filled 2019-03-04: qty 10

## 2019-03-04 MED ORDER — CEFAZOLIN SODIUM-DEXTROSE 2-4 GM/100ML-% IV SOLN
INTRAVENOUS | Status: AC
  Filled 2019-03-04: qty 100

## 2019-03-04 MED ORDER — DEXAMETHASONE SODIUM PHOSPHATE 10 MG/ML IJ SOLN
INTRAMUSCULAR | Status: DC | PRN
  Administered 2019-03-04: 4 mg via INTRAVENOUS

## 2019-03-04 MED ORDER — FENTANYL CITRATE (PF) 250 MCG/5ML IJ SOLN
INTRAMUSCULAR | Status: AC
  Filled 2019-03-04: qty 5

## 2019-03-04 MED ORDER — PROPOFOL 10 MG/ML IV BOLUS
INTRAVENOUS | Status: DC | PRN
  Administered 2019-03-04: 200 mg via INTRAVENOUS

## 2019-03-04 MED ORDER — FENTANYL CITRATE (PF) 100 MCG/2ML IJ SOLN
INTRAMUSCULAR | Status: AC
  Administered 2019-03-04: 50 ug via INTRAVENOUS
  Filled 2019-03-04: qty 2

## 2019-03-04 MED ORDER — CHLORHEXIDINE GLUCONATE CLOTH 2 % EX PADS: 6.0000 | MEDICATED_PAD | Freq: Once | CUTANEOUS | Status: DC

## 2019-03-04 MED ORDER — ACETAMINOPHEN 500 MG PO TABS
1000.0000 mg | ORAL_TABLET | ORAL | Status: AC
  Administered 2019-03-04: 09:00:00 1000 mg via ORAL

## 2019-03-04 MED ORDER — EPHEDRINE 5 MG/ML INJ
INTRAVENOUS | Status: AC
  Filled 2019-03-04: qty 10

## 2019-03-04 MED ORDER — GABAPENTIN 300 MG PO CAPS
ORAL_CAPSULE | ORAL | Status: AC
  Administered 2019-03-04: 300 mg via ORAL
  Filled 2019-03-04: qty 1

## 2019-03-04 MED ORDER — ONDANSETRON HCL 4 MG/2ML IJ SOLN
INTRAMUSCULAR | Status: DC | PRN
  Administered 2019-03-04: 4 mg via INTRAVENOUS

## 2019-03-04 MED ORDER — SODIUM CHLORIDE 0.9 % IV SOLN
INTRAVENOUS | Status: DC
  Administered 2019-03-04: 09:00:00 via INTRAVENOUS

## 2019-03-04 MED ORDER — DEXAMETHASONE SODIUM PHOSPHATE 10 MG/ML IJ SOLN
INTRAMUSCULAR | Status: AC
  Filled 2019-03-04: qty 1

## 2019-03-04 MED ORDER — PHENYLEPHRINE 40 MCG/ML (10ML) SYRINGE FOR IV PUSH (FOR BLOOD PRESSURE SUPPORT)
PREFILLED_SYRINGE | INTRAVENOUS | Status: AC
  Filled 2019-03-04: qty 10

## 2019-03-04 MED ORDER — LIDOCAINE 2% (20 MG/ML) 5 ML SYRINGE
INTRAMUSCULAR | Status: AC
  Filled 2019-03-04: qty 10

## 2019-03-04 SURGICAL SUPPLY — 34 items
APPLIER CLIP 5 13 M/L LIGAMAX5 (MISCELLANEOUS)
BLADE CLIPPER SURG (BLADE) ×2 IMPLANT
CHLORAPREP W/TINT 26 (MISCELLANEOUS) ×2 IMPLANT
CLIP APPLIE 5 13 M/L LIGAMAX5 (MISCELLANEOUS) IMPLANT
COVER SURGICAL LIGHT HANDLE (MISCELLANEOUS) ×2 IMPLANT
COVER WAND RF STERILE (DRAPES) ×2 IMPLANT
DERMABOND ADHESIVE PROPEN (GAUZE/BANDAGES/DRESSINGS) ×1
DERMABOND ADVANCED (GAUZE/BANDAGES/DRESSINGS) ×1
DERMABOND ADVANCED .7 DNX12 (GAUZE/BANDAGES/DRESSINGS) ×1 IMPLANT
DERMABOND ADVANCED .7 DNX6 (GAUZE/BANDAGES/DRESSINGS) ×1 IMPLANT
DISSECT BALLN SPACEMKR + OVL (BALLOONS)
DISSECTOR BALLN SPACEMKR + OVL (BALLOONS) IMPLANT
DRAIN PENROSE 1/4X12 LTX STRL (WOUND CARE) ×2 IMPLANT
ELECT REM PT RETURN 9FT ADLT (ELECTROSURGICAL) ×2
ELECTRODE REM PT RTRN 9FT ADLT (ELECTROSURGICAL) ×1 IMPLANT
GLOVE BIOGEL PI IND STRL 7.0 (GLOVE) ×1 IMPLANT
GLOVE BIOGEL PI INDICATOR 7.0 (GLOVE) ×1
GLOVE SURG SS PI 7.0 STRL IVOR (GLOVE) ×2 IMPLANT
GOWN STRL REUS W/ TWL LRG LVL3 (GOWN DISPOSABLE) ×3 IMPLANT
GOWN STRL REUS W/TWL LRG LVL3 (GOWN DISPOSABLE) ×3
KIT BASIN OR (CUSTOM PROCEDURE TRAY) ×2 IMPLANT
KIT TURNOVER KIT B (KITS) ×2 IMPLANT
MESH ULTRAPRO 3X6 7.6X15CM (Mesh General) ×2 IMPLANT
NEEDLE 22X1 1/2 (OR ONLY) (NEEDLE) ×2 IMPLANT
NS IRRIG 1000ML POUR BTL (IV SOLUTION) ×2 IMPLANT
PAD ARMBOARD 7.5X6 YLW CONV (MISCELLANEOUS) ×2 IMPLANT
SET TUBE SMOKE EVAC HIGH FLOW (TUBING) ×2 IMPLANT
SUT MNCRL AB 4-0 PS2 18 (SUTURE) ×2 IMPLANT
SUT VIC AB 3-0 CT1 27 (SUTURE) ×1
SUT VIC AB 3-0 CT1 TAPERPNT 27 (SUTURE) ×1 IMPLANT
TOWEL GREEN STERILE (TOWEL DISPOSABLE) ×2 IMPLANT
TOWEL GREEN STERILE FF (TOWEL DISPOSABLE) ×2 IMPLANT
TRAY LAPAROSCOPIC MC (CUSTOM PROCEDURE TRAY) ×2 IMPLANT
WATER STERILE IRR 1000ML POUR (IV SOLUTION) ×2 IMPLANT

## 2019-03-04 NOTE — Transfer of Care (Signed)
Immediate Anesthesia Transfer of Care Note  Patient: Logan Martin  Procedure(s) Performed: LEFT OPEN INGUINAL HERNIA REPAIR WITH MESH (Left Groin)  Patient Location: PACU  Anesthesia Type:General  Level of Consciousness: awake and drowsy  Airway & Oxygen Therapy: Patient Spontanous Breathing and Patient connected to face mask oxygen  Post-op Assessment: Report given to RN and Post -op Vital signs reviewed and stable  Post vital signs: Reviewed and stable  Last Vitals:  Vitals Value Taken Time  BP 124/74 03/04/19 1130  Temp    Pulse 64 03/04/19 1132  Resp 16 03/04/19 1132  SpO2 100 % 03/04/19 1132  Vitals shown include unvalidated device data.  Last Pain:  Vitals:   03/04/19 0811  TempSrc:   PainSc: 0-No pain         Complications: No apparent anesthesia complications

## 2019-03-04 NOTE — Anesthesia Procedure Notes (Signed)
Anesthesia Regional Block: TAP block   Pre-Anesthetic Checklist: ,, timeout performed, Correct Patient, Correct Site, Correct Laterality, Correct Procedure, Correct Position, site marked, Risks and benefits discussed,  Surgical consent,  Pre-op evaluation,  At surgeon's request and post-op pain management  Laterality: Left  Prep: chloraprep       Needles:  Injection technique: Single-shot  Needle Type: Stimiplex     Needle Length: 9cm  Needle Gauge: 21     Additional Needles:   Procedures:,,,, ultrasound used (permanent image in chart),,,,  Narrative:  Start time: 03/04/2019 9:34 AM End time: 03/04/2019 9:39 AM Injection made incrementally with aspirations every 5 mL.  Performed by: Personally  Anesthesiologist: Lynda Rainwater, MD

## 2019-03-04 NOTE — H&P (Signed)
Logan Martin is an 63 y.o. male.   Chief Complaint: left inguinal hernia HPI: 63 yo male with left groin pain and findings of left inguinal hernia. He denies nausea or vomiting.  Past Medical History:  Diagnosis Date  . Alcohol abuse    quit date in 1995  . Anemia   . Cancer ([place])    bladder  . Chronic low back pain   . Cocaine abuse in remission ([place])    Quit date in 1995  . 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 Started HD in [place], [place].  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  On [facility] home hemo since 2010. Using RUA AVF.    [name] ESRD on dialysis ([place])    "TTS; [facility]" (06/14/2016)  . Hearing aid worn    B/L  . Hepatitis C    s/p treatment with Mavyret  . History of blood transfusion 2007 X 1  . History of kidney stones   . Hypertension   . Left inguinal hernia   . Peripheral vascular disease ([place])   . Pulmonary embolism [facility]) Feb 2012   Treated with coumadin x 1 year  . Renal insufficiency   . Wears glasses     Past Surgical History:  Procedure Laterality Date  . A/V FISTULAGRAM N/A 11/22/2017   Procedure: A/V Fistulagram;  Surgeon: Serafina Mitchell, MD;  Location: [place] CV [facility];  Service: Cardiovascular;  Laterality: N/A;  rt. arm   . ABDOMINAL HERNIA REPAIR  ~ 2000 X 2; 2001 X1  . AV FISTULA PLACEMENT Right 2007   upper arm  . AV FISTULA REPAIR Right ~ 03/2016 & 04/2016  . BLADDER AUGMENTATION  1975  . CORONARY ANGIOPLASTY WITH STENT PLACEMENT  1999  . CYSTOSCOPY W/ URETERAL STENT PLACEMENT N/A 10/13/2017   Procedure: CYSTOSCOPY WITH IRRIGATION OF BLADDER PLACEMENT OF FOLEY;  Surgeon: Ceasar Mons, MD;  Location: [place];  Service: Urology;  Laterality: N/A;  . CYSTOSCOPY WITH FULGERATION N/A 03/27/2018   Procedure: Tensed AND MUCOUS EVACUATION, BLADDER BIOPSY;  Surgeon: Franchot Gallo, MD;  Location: [facility];  Service: Urology;  Laterality: N/A;  . EXTRACORPOREAL  SHOCK WAVE LITHOTRIPSY    . HERNIA REPAIR    . INGUINAL HERNIA REPAIR Right 1975  . PERIPHERAL VASCULAR BALLOON ANGIOPLASTY  11/22/2017   Procedure: PERIPHERAL VASCULAR BALLOON ANGIOPLASTY;  Surgeon: Serafina Mitchell, MD;  Location: [place] [facility];  Service: Cardiovascular;;  rt. arm fistula  . PERIPHERAL VASCULAR CATHETERIZATION N/A 06/22/2016   Procedure: Left Arm Venography;  Surgeon: Elam Dutch, MD;  Location: [place] [facility];  Service: Cardiovascular;  Laterality: N/A;  . PERIPHERAL VASCULAR CATHETERIZATION N/A 06/22/2016   Procedure: A/V Fistulagram - Right Arm;  Surgeon: Elam Dutch, MD;  Location: [place] [facility];  Service: Cardiovascular;  Laterality: N/A;  . PERIPHERAL VASCULAR CATHETERIZATION Right 06/22/2016   Procedure: Peripheral Vascular Balloon Angioplasty;  Surgeon: Elam Dutch, MD;  Location: [place] [facility];  Service: Cardiovascular;  Laterality: Right;  arm fistula    Family History  Problem Relation Age of Onset  . Cancer Mother   . Diabetes Mother   . Hypertension Mother   . Deep vein thrombosis Daughter    Social History:  reports that he quit smoking about 25 years ago. His smoking use included cigarettes. He has a 5.00 pack-year smoking history. He has never used smokeless tobacco. He reports previous alcohol  use. He reports current drug use. Drugs: Marijuana and Cocaine.  Allergies:  Allergies  Allergen Reactions  . Heparin Rash    Pork products   . Pork-Derived Products Rash  . Sulfa Antibiotics Rash    Medications Prior to Admission  Medication Sig Dispense Refill  . aspirin EC 81 MG tablet Take 81 mg by mouth at bedtime.     [name] atorvastatin (LIPITOR) 10 MG tablet Take 10 mg by mouth daily.    . calcitRIOL (ROCALTROL) 0.5 MCG capsule Take 1.5 mcg by mouth daily.  3  . calcium acetate (PHOSLO) 667 MG capsule Take 1,334 mg by mouth 3 (three) times daily with meals.     . hydrALAZINE (APRESOLINE) 50 MG tablet Take 50 mg by mouth 3  (three) times daily.  3  . isosorbide mononitrate (IMDUR) 30 MG 24 hr tablet Take 30 mg by mouth daily.     [name] labetalol (NORMODYNE) 200 MG tablet Take 400 mg by mouth 2 (two) times daily.    [name] lidocaine-prilocaine (EMLA) cream Apply 1 application topically See admin instructions. Apply small amount to dialysis port (AVF) one hour before dialysis. Cover with occlusive dressing (saran wrap)  6  . metoprolol succinate (TOPROL-XL) 50 MG 24 hr tablet Take 50 mg by mouth daily. Take with or immediately following a meal.    . Multiple Vitamin (MULTIVITAMIN WITH MINERALS) TABS tablet Take 1 tablet by mouth daily.    [name] oxycodone (ROXICODONE) 30 MG immediate release tablet Take 30 mg by mouth 3 (three) times daily.    . Oxycodone HCl 10 MG TABS Take 10 mg by mouth 3 (three) times daily as needed (breakthrough pain).   0    Results for orders placed or performed during the hospital encounter of 03/04/19 (from the past 48 hour(s))  CBC     Status: Abnormal   Collection Time: 03/04/19  8:40 AM  Result Value Ref Range   WBC 3.6 (L) 4.0 - 10.5 K/uL   RBC 3.92 (L) 4.22 - 5.81 MIL/uL   Hemoglobin 11.8 (L) 13.0 - 17.0 g/dL   HCT 37.5 (L) 39.0 - 52.0 %   MCV 95.7 80.0 - 100.0 fL   MCH 30.1 26.0 - 34.0 pg   MCHC 31.5 30.0 - 36.0 g/dL   RDW 17.1 (H) 11.5 - 15.5 %   Platelets 93 (L) 150 - 400 K/uL    Comment: REPEATED TO VERIFY Immature Platelet Fraction may be clinically indicated, consider ordering this additional test [id number] CONSISTENT WITH PREVIOUS RESULT    nRBC 0.0 0.0 - 0.2 %    Comment: Performed at [facility], [address] [place], [place] [address]   No results found.  Review of Systems  Constitutional: Negative for chills and fever.  HENT: Negative for hearing loss.   Eyes: Negative for blurred vision and double vision.  Respiratory: Negative for cough and hemoptysis.   Cardiovascular: Negative for chest pain and palpitations.  Gastrointestinal: Negative for abdominal pain,  nausea and vomiting.  Genitourinary: Negative for dysuria and urgency.  Musculoskeletal: Negative for myalgias and neck pain.  Skin: Negative for itching and rash.  Neurological: Negative for dizziness, tingling and headaches.  Endo/Heme/Allergies: Does not bruise/bleed easily.  Psychiatric/Behavioral: Negative for depression and suicidal ideas.    Blood pressure 135/78, pulse 86, temperature 98.3 F (36.8 C), temperature source Oral, resp. rate 18, height 5\' 4"  (1.626 m), weight 49.9 kg, SpO2 100 %. Physical Exam  Vitals reviewed. Constitutional: He is oriented  to person, place, and time. He appears well-developed and well-nourished.  HENT:  Head: Normocephalic and atraumatic.  Eyes: Pupils are equal, round, and reactive to light. Conjunctivae and EOM are normal.  Neck: Normal range of motion. Neck supple.  Cardiovascular: Normal rate and regular rhythm.  Respiratory: Effort normal and breath sounds normal.  GI: Soft. Bowel sounds are normal. He exhibits no distension. There is no abdominal tenderness.  Left inguinal hernia  Musculoskeletal: Normal range of motion.  Neurological: He is alert and oriented to person, place, and time.  Skin: Skin is warm and dry.  Psychiatric: He has a normal mood and affect. His behavior is normal.     Assessment/Plan 63 yo male with symptomatic left inguinal hernia -left open inguinal hernia repair with mesh -ERAS protocol -planned outpatient procedure  Mickeal Skinner, MD 03/04/2019, 9:30 AM

## 2019-03-04 NOTE — Progress Notes (Signed)
Spoke to Dr. Reece Agar. Let MD know that patient had antibodies in 3/20 and inquired if he wanted a type and screen.  Pending lab results.  Dr. Reece Agar stated that is hemoglobin is at least 10, do not need T&S.

## 2019-03-04 NOTE — Anesthesia Postprocedure Evaluation (Signed)
Anesthesia Post Note  Patient: DONTARIOUS SCHAUM  Procedure(s) Performed: LEFT OPEN INGUINAL HERNIA REPAIR WITH MESH (Left Groin)     Patient location during evaluation: PACU Anesthesia Type: General Level of consciousness: awake and alert Pain management: pain level controlled Vital Signs Assessment: post-procedure vital signs reviewed and stable Respiratory status: spontaneous breathing, nonlabored ventilation and respiratory function stable Cardiovascular status: blood pressure returned to baseline and stable Postop Assessment: no apparent nausea or vomiting Anesthetic complications: no    Last Vitals:  Vitals:   03/04/19 1230 03/04/19 1254  BP: (!) 147/55 (!) 159/73  Pulse: 66   Resp: 19 15  Temp: 36.4 C (!) 36.1 C  SpO2: 98% 100%    Last Pain:  Vitals:   03/04/19 1230  TempSrc:   PainSc: 0-No pain                 Lynda Rainwater

## 2019-03-04 NOTE — Anesthesia Procedure Notes (Signed)
Procedure Name: LMA Insertion Date/Time: 03/04/2019 10:15 AM Performed by: Inda Coke, CRNA Pre-anesthesia Checklist: Patient identified, Emergency Drugs available, Suction available and Patient being monitored Patient Re-evaluated:Patient Re-evaluated prior to induction Oxygen Delivery Method: Circle System Utilized Preoxygenation: Pre-oxygenation with 100% oxygen Induction Type: IV induction Ventilation: Mask ventilation without difficulty LMA: LMA inserted LMA Size: 4.0 Number of attempts: 1 Placement Confirmation: positive ETCO2 Tube secured with: Tape Dental Injury: Teeth and Oropharynx as per pre-operative assessment

## 2019-03-04 NOTE — Op Note (Signed)
Preop diagnosis: left inguinal hernia  Postop diagnosis: left indirect inguinal hernia  Procedure: open Left inguinal hernia repair with mesh  Surgeon: Gurney Maxin, M.D.  Asst: Jeralyn Bennett  Anesthesia: Gen.   Indications for procedure: Logan Martin is a 63 y.o. male with symptoms of pain and enlarging Left inguinal hernia(s). After discussing risks, alternatives and benefits he decided on open repair and was brought to day surgery for repair.  Description of procedure: The patient was brought into the operative suite, placed supine. Anesthesia was administered with endotracheal tube. Patient was strapped in place. The patient was prepped and draped in the usual sterile fashion.  The anterior superior iliac spine and pubic tubercle were identified on the Left side. An incision was made 1cm above the connecting line, representative of the location of the inguinal ligament. The subcutaneous tissue was bluntly dissected, scarpa's fascia was dissected away. The external abdominal oblique fascia was identified and sharply opened down to the external inguinal ring. The conjoint tendon and inguinal ligament were identified. The cord structures and sac were dissected free of the surrounding tissue in 360 degrees. A penrose drain was used to encircle the contents. The cremasteric fibers were dissected free of the contents of the cord and hernia sac. The cord structures (vessels and vas deferens) were identified and carefully dissected away from the hernia sac. The hernia sac was dissected down to the internal inguinal ring. Preperitoneal fat was identified showing appropriate dissection. The sac was moderate size and indirect. The sac was then reduced into the preperitoneal space. A 3x6 Ultrapro mesh was then used to close the defect and reinforce the floor. The mesh was sutured to the lacunar ligament and inguinal ligament using a 2-0 prolene in running fashion. Next the superior edge of the mesh  was sutured to the conjoined tendon using a 2-0 running Prolene. An additional 2-0 Prolene was used to suture the tail ends of the mesh together re-creating the deep ring. Cord structures are running in a neutral position through the mesh. Next the external abdominal oblique fascia was closed with a 2-0 Vicryl in running fashion to re-create the external inguinal ring. Scarpa's fascia was closed with 3-0 Vicryl in running fashion. Skin was closed with a 4-0 Monocryl subcuticular stitch in running fashion. Dermabond place for dressing. Patient woke from anesthesia and brought to PACU in stable condition. All counts are correct.    Findings: left indirect inguinal hernia  Specimen: none  Blood loss: 10 ml  Local anesthesia: preop TAP block  Complications: none  Implant: 3 x 6 in Ultrapro  Gurney Maxin, M.D. General, Bariatric, & Minimally Invasive Surgery [facility], [place] 11:21 AM 03/04/2019

## 2019-03-05 ENCOUNTER — Encounter (HOSPITAL_COMMUNITY): Payer: Self-pay | Admitting: General Surgery

## 2019-04-16 IMAGING — CT CT ABD-PELV W/O CM
2 of 4 series · 15 of 46 positions shown, 17 images · non-contrast
Comparison: Prior CT scan of the abdomen and pelvis 06/01/2016

CLINICAL DATA: 60-year-old male with central abdominal pain and
tenderness over ventral hernia repair site

EXAM:
CT ABDOMEN AND PELVIS WITHOUT CONTRAST
TECHNIQUE: Multidetector CT imaging of the abdomen and pelvis was performed
following the standard protocol without IV contrast.

[Series 3: abd/ pelvis 5.0 i30f 2 · axial · 0.68mm/px · z∈[+961,+1291]mm · 12 of 80 slices shown, 14 images]
[im 7/80  soft-tissue]
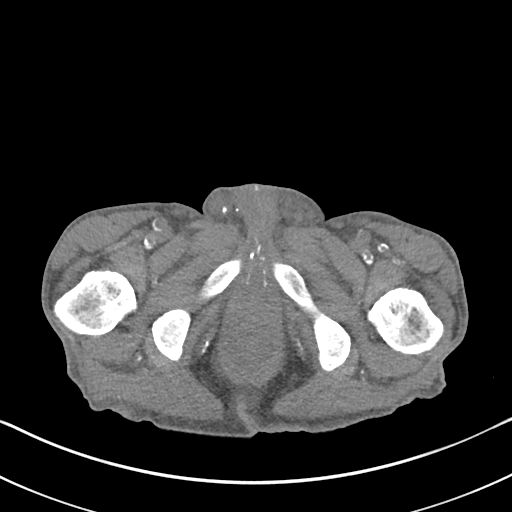
[im 7/80  bone]
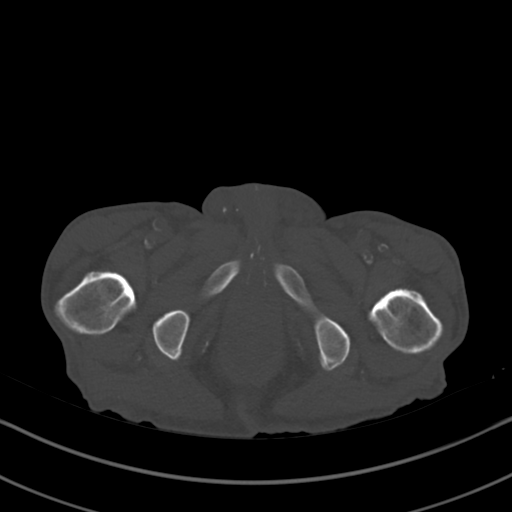
[im 13/80  soft-tissue]
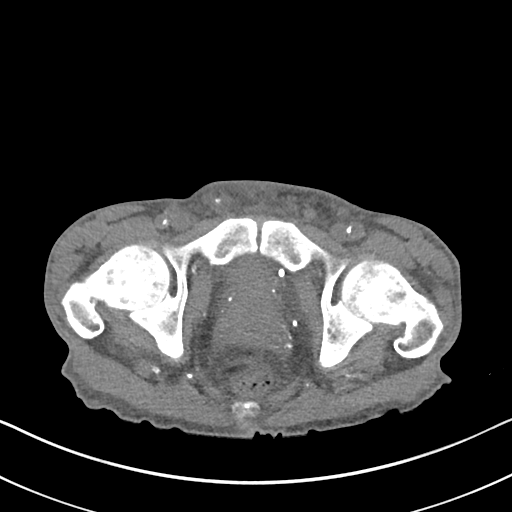
[im 19/80  soft-tissue]
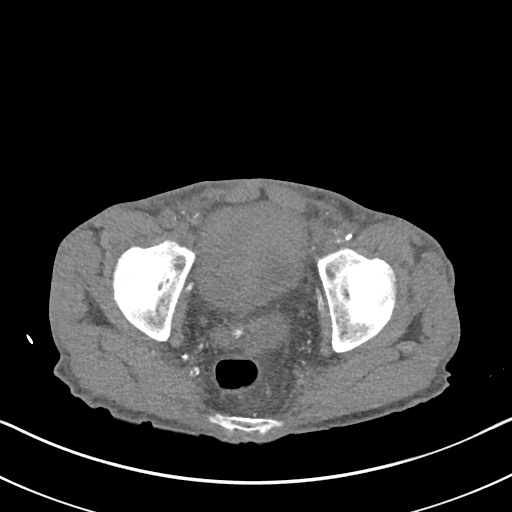
[im 25/80  soft-tissue]
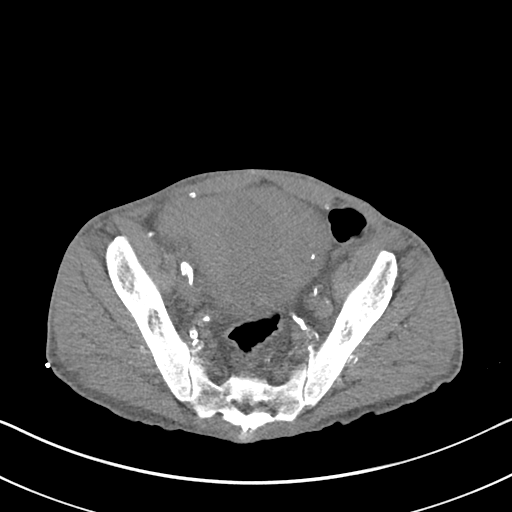
[im 31/80  soft-tissue]
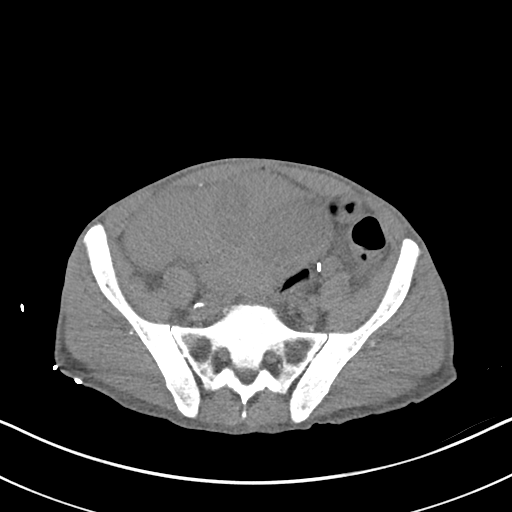
[im 37/80  soft-tissue]
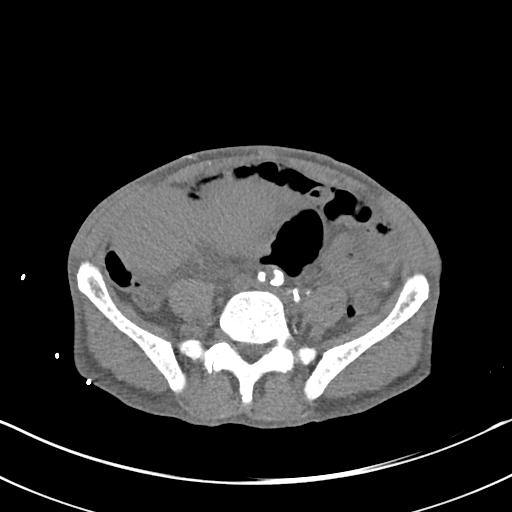
[im 43/80  soft-tissue]
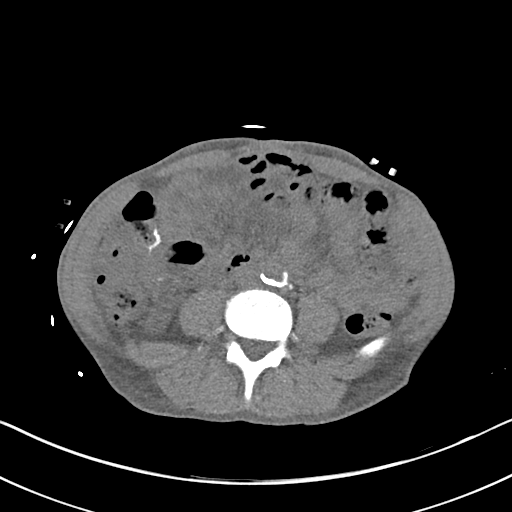
[im 49/80  soft-tissue]
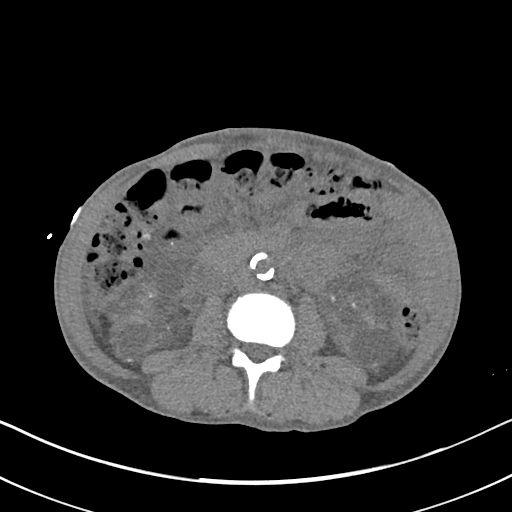
[im 55/80  soft-tissue]
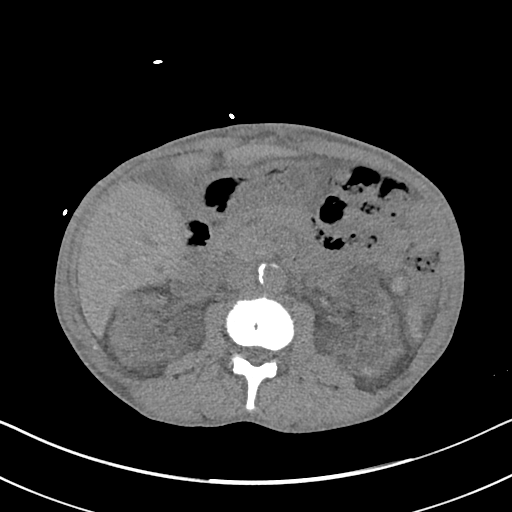
[im 55/80  bone]
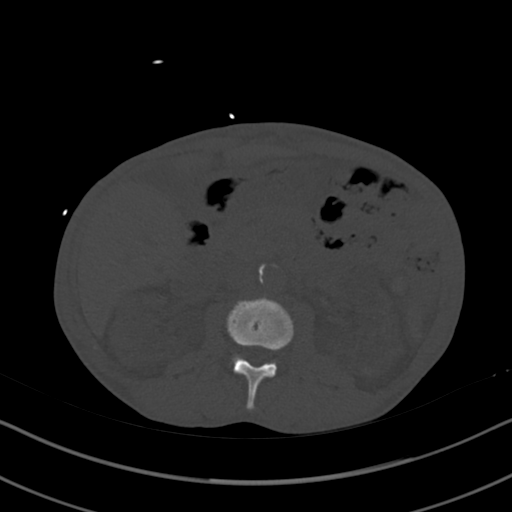
[im 61/80  soft-tissue]
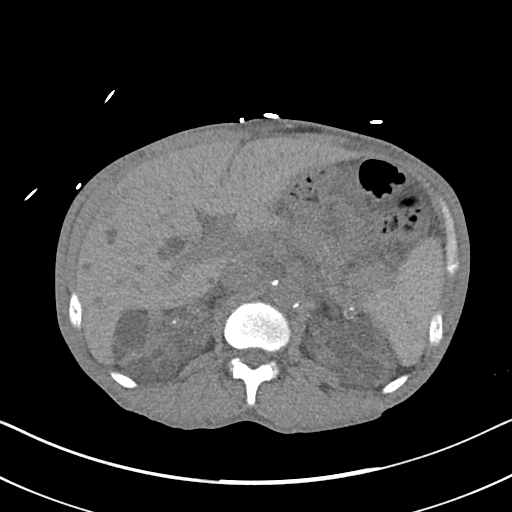
[im 67/80  soft-tissue]
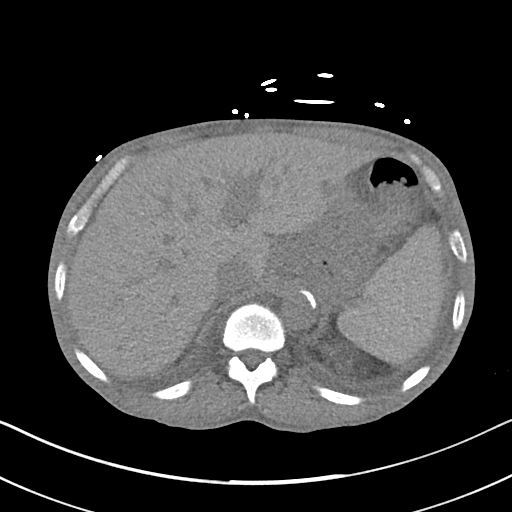
[im 73/80  soft-tissue]
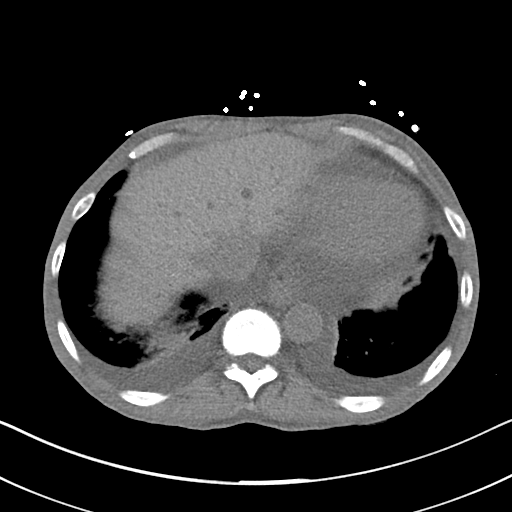

[Series 5: cor st · coronal · 0.70mm/px · 3 of 79 slices shown]
[im 27/79  soft-tissue]
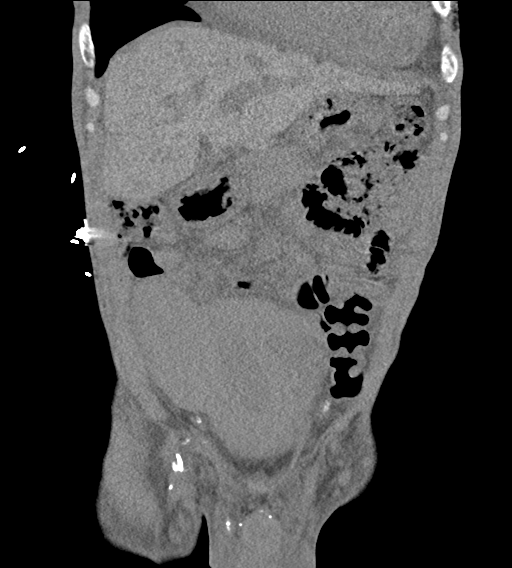
[im 35/79  soft-tissue]
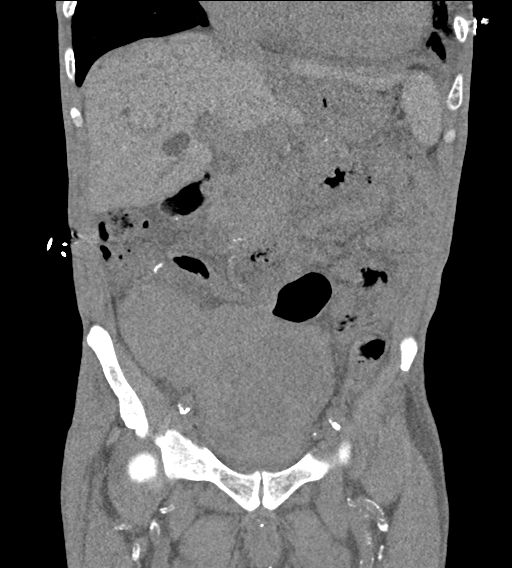
[im 44/79  soft-tissue]
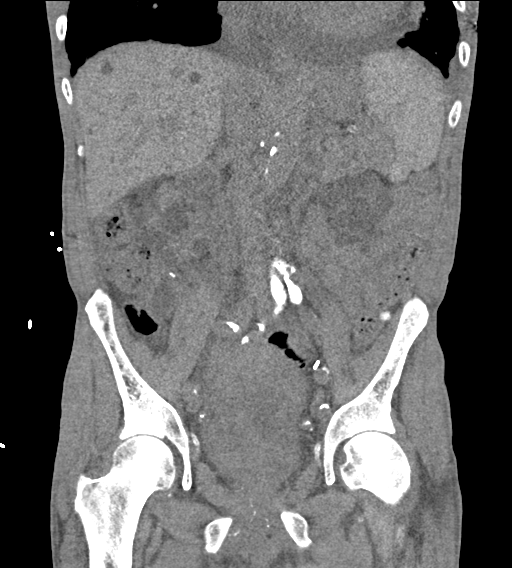

[15 of 46 positions shown; findings below may reference images not displayed]

FINDINGS: Lower chest: Small right and trace left pleural effusions with
associated bilateral lower lobe atelectasis. Incompletely visualized
marked cardiomegaly. Small pericardial effusion has decreased in
volume compared to 06/01/2016. Unremarkable visualized distal
thoracic esophagus. Stable coronary artery calcifications.

Hepatobiliary: Numerous circumscribed low-attenuation cystic lesions
scattered throughout the liver consistent with known hepatic cysts.
No new lesion or biliary ductal dilatation.

Pancreas: Unremarkable. No pancreatic ductal dilatation or
surrounding inflammatory changes.

Spleen: Normal in size without focal abnormality.

Adrenals/Urinary Tract: Innumerable cysts scattered throughout both
kidneys are incompletely evaluated in the absence of intravenous
contrast material but grossly unchanged compared to prior imaging.
Findings are consistent with the stigmata of polycystic kidney
disease. Persistent marked heterogeneous enlargement of the bladder
presently measuring 8.4 by 14 cm which is very similar compared to
the prior imaging from Saturday May, 2016. The internal contents of the
bladder remain heterogeneous with areas of high and intermediate
attenuation.

Stomach/Bowel: Surgical changes of prior partial right colectomy.
The anastomosis appears patent. No evidence of obstruction.

Vascular/Lymphatic: Limited evaluation in the absence of intravenous
contrast. Extensive atherosclerotic vascular calcifications. No
obvious aneurysm.

Reproductive: Prostate is unremarkable.

Other: No evidence of recurrent abdominal wall hernia. No ascites.
Mild stranding throughout the minimal subcutaneous and intra-
abdominal fat consistent with edema.

Musculoskeletal: Diffuse osteosclerosis likely reflecting renal
osteodystrophy.
IMPRESSION: 1. Similar appearance of the bladder compared to 06/01/2016 but
progressive compared to 03/10/2012. The bladder remains enlarged,
lobular and full of heterogeneous high to intermediate attenuation
material. This likely represents continued accumulation of chronic
thrombus versus inspissated mucinous debris.
2. No definite new or acute intra-abdominal process.
3. Small right and trace left pleural effusions with associated
lower lobe atelectasis.
4. Persistent but improved pericardial effusion compared to
06/01/2016.
5. Marked cardiomegaly, unchanged.
6. Stigmata of polycystic kidney disease with numerous bilateral
renal and hepatic cysts. All cystic structures are incompletely
evaluated in the absence of intravenous contrast.
7.  Aortic Atherosclerosis ([id number])
8. Renal osteodystrophy.
9. Additional ancillary findings as above without significant
interval change.

## 2019-05-05 ENCOUNTER — Ambulatory Visit: Payer: Medicare Other | Admitting: Cardiology

## 2019-10-01 ENCOUNTER — Telehealth: Payer: Self-pay | Admitting: Nurse Practitioner

## 2019-10-01 NOTE — Telephone Encounter (Signed)
Patient called to be screened for possible homebound infusion. No answer. No available voice mail.

## 2019-10-13 ENCOUNTER — Telehealth: Payer: Self-pay | Admitting: Nurse Practitioner

## 2019-10-13 NOTE — Telephone Encounter (Signed)
Patient called to be screened for possible homebound vaccine - no answer and no available voice mail.

## 2019-10-23 ENCOUNTER — Other Ambulatory Visit: Payer: Self-pay | Admitting: *Deleted

## 2019-10-23 DIAGNOSIS — N186 End stage renal disease: Secondary | ICD-10-CM

## 2019-10-28 ENCOUNTER — Ambulatory Visit (INDEPENDENT_AMBULATORY_CARE_PROVIDER_SITE_OTHER): Payer: Medicare Other | Admitting: Vascular Surgery

## 2019-10-28 ENCOUNTER — Other Ambulatory Visit: Payer: Self-pay

## 2019-10-28 ENCOUNTER — Encounter: Payer: Self-pay | Admitting: Vascular Surgery

## 2019-10-28 ENCOUNTER — Ambulatory Visit (HOSPITAL_COMMUNITY)
Admission: RE | Admit: 2019-10-28 | Discharge: 2019-10-28 | Disposition: A | Discharge status: Home / Self Care | Payer: Medicare Other | Source: Ambulatory Visit | Attending: Vascular Surgery | Admitting: Vascular Surgery

## 2019-10-28 ENCOUNTER — Other Ambulatory Visit: Payer: Self-pay | Admitting: Vascular Surgery

## 2019-10-28 VITALS — BP 148/73 | HR 96 | Temp 97.7°F | Resp 20 | Ht 64.0 in | Wt 110.0 lb

## 2019-10-28 DIAGNOSIS — Z992 Dependence on renal dialysis: Secondary | ICD-10-CM | POA: Diagnosis not present

## 2019-10-28 DIAGNOSIS — N186 End stage renal disease: Secondary | ICD-10-CM | POA: Diagnosis not present

## 2019-10-28 NOTE — Progress Notes (Signed)
REASON FOR CONSULT:    To evaluate for new access.  The consult is requested by Dr. Jimmy Footman.  ASSESSMENT & PLAN:   END-STAGE RENAL DISEASE: This patient's history is somewhat complicated.  He had 2 failed grafts in the left arm.  He has an AV fistula in the right upper arm which has had since 2007 that was placed in [place].  This is markedly aneurysmal.  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  I think he should be able to continue to use his fistula on the right although he has some areas where the skin is thinning out over the fistula.  I have recommended staged plication of 2 large aneurysms in the fistula.  We would start with the more peripheral aneurysm and then once this is healed address the more centrally located aneurysm.  I have discussed with him the indications of the procedure and the potential complications and he is agreeable to proceed.  Given that the fistula has an excellent thrill I do not think a preoperative fistulogram is necessary.  [name], MD Office: [phone]   HPI:   Logan Martin is a pleasant 64 y.o. male, who was referred to be evaluated for new access.  He had a right upper arm fistula placed in [place] in 2007 however this is markedly aneurysmal.  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  He does home dialysis and dialyzes on Mondays Tuesdays Thursdays and Fridays.  He is not sure where the SVC syndrome was diagnosed but has had worked both here at [employer] and also in [place].  He denies any recent uremic symptoms.  Past Medical History:  Diagnosis Date  . Alcohol abuse    quit date in 1995  . Anemia   . Cancer ([place])    bladder  . Chronic low back pain   . 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 Started HD in [place], [place].  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  On NxStage home hemo since 2010. Using RUA AVF.    [name] ESRD on dialysis ([place])    "TTS; [place]" (06/14/2016)  . Hearing aid worn    B/L  . Hepatitis C    s/p treatment with Mavyret  . History of blood transfusion 2007 X 1  . History of kidney stones   . Hypertension   . Left inguinal hernia   . Peripheral vascular disease ([place])   . Pulmonary embolism [facility]) Feb 2012   Treated with coumadin x 1 year  . Renal insufficiency   . Wears glasses     Family History  Problem Relation Age of Onset  . Cancer Mother   . Diabetes Mother   . Hypertension Mother   . Deep vein thrombosis Daughter     SOCIAL HISTORY: Social History   Socioeconomic History  . Marital status: Single    Spouse name: Not on file  . Number of children: Not on file  . Years of education: Not on file  . Highest education level: Not on file  Occupational History  . Not on file  Tobacco Use  . Smoking status:  Former Smoker    Packs/day: 1.00    Years: 5.00    Pack years: 5.00    Types: Cigarettes    Quit date: 05/28/1993    Years since quitting: 26.4  . Smokeless tobacco: Never Used  Substance and Sexual Activity  . Alcohol use: Not Currently    Comment:  "nothing since 1995"  . Drug use: Yes    Types: Marijuana, Cocaine    Comment: denies 03/02/19  . Sexual activity: Yes  Other Topics Concern  . Not on file  Social History Narrative  . Not on file   Social Determinants of Health   Financial Resource Strain:   . Difficulty of Paying Living Expenses:   Food Insecurity:   . Worried About Charity fundraiser in the Last Year:   . Arboriculturist in the Last Year:   Transportation Needs:   . Film/video editor (Medical):   [name] Lack of Transportation (Non-Medical):   Physical Activity:   . Days of Exercise per Week:   . Minutes of Exercise per Session:   Stress:   . Feeling of  Stress :   Social Connections:   . Frequency of Communication with Friends and Family:   . Frequency of Social Gatherings with Friends and Family:   . Attends Religious Services:   . Active Member of Clubs or Organizations:   . Attends Archivist Meetings:   [name] Marital Status:   Intimate Partner Violence:   . Fear of Current or Ex-Partner:   . Emotionally Abused:   [name] Physically Abused:   . Sexually Abused:     Allergies  Allergen Reactions  . Heparin Rash    Pork products   . Pork-Derived Products Rash  . Sulfa Antibiotics Rash    Current Outpatient Medications  Medication Sig Dispense Refill  . aspirin EC 81 MG tablet Take 81 mg by mouth at bedtime.     [name] atorvastatin (LIPITOR) 10 MG tablet Take 10 mg by mouth daily.    . calcitRIOL (ROCALTROL) 0.5 MCG capsule Take 1.5 mcg by mouth daily.  3  . calcium acetate (PHOSLO) 667 MG capsule Take 1,334 mg by mouth 3 (three) times daily with meals.     [name] ibuprofen (ADVIL) 800 MG tablet Take 1 tablet (800 mg total) by mouth every 8 (eight) hours as needed. 30 tablet 0  . labetalol (NORMODYNE) 200 MG tablet Take 400 mg by mouth 2 (two) times daily.    [name] lidocaine-prilocaine (EMLA) cream Apply 1 application topically See admin instructions. Apply small amount to dialysis port (AVF) one hour before dialysis. Cover with occlusive dressing (saran wrap)  6  . metoprolol succinate (TOPROL-XL) 50 MG 24 hr tablet Take 50 mg by mouth daily. Take with or immediately following a meal.    . Multiple Vitamin (MULTIVITAMIN WITH MINERALS) TABS tablet Take 1 tablet by mouth daily.    [name] oxyCODONE (OXY IR/ROXICODONE) 5 MG immediate release tablet Take 1 tablet (5 mg total) by mouth every 6 (six) hours as needed for severe pain. 15 tablet 0  . oxycodone (ROXICODONE) 30 MG immediate release tablet Take 30 mg by mouth 3 (three) times daily.    . Oxycodone HCl 10 MG TABS Take 10 mg by mouth 3 (three) times daily as needed (breakthrough pain).   0  .  isosorbide mononitrate (IMDUR) 30 MG 24 hr tablet Take 30 mg by mouth daily.      No current facility-administered medications  for this visit.    REVIEW OF SYSTEMS:  [X]  denotes positive finding, [ ]  denotes negative finding Cardiac  Comments:  Chest pain or chest pressure:    Shortness of breath upon exertion:    Short of breath when lying flat:    Irregular heart rhythm:        Vascular    Pain in calf, thigh, or hip brought on by ambulation:    Pain in feet at night that wakes you up from your sleep:     Blood clot in your veins:    Leg swelling:         Pulmonary    Oxygen at home:    Productive cough:     Wheezing:         Neurologic    Sudden weakness in arms or legs:     Sudden numbness in arms or legs:     Sudden onset of difficulty speaking or slurred speech:    Temporary loss of vision in one eye:     Problems with dizziness:         Gastrointestinal    Blood in stool:     Vomited blood:         Genitourinary    Burning when urinating:     Blood in urine:        Psychiatric    Major depression:         Hematologic    Bleeding problems:    Problems with blood clotting too easily:        Skin    Rashes or ulcers:        Constitutional    Fever or chills:     PHYSICAL EXAM:   Vitals:   10/28/19 1326  BP: (!) 148/73  Pulse: 96  Resp: 20  Temp: 97.7 F (36.5 C)  Weight: 110 lb (49.9 kg)  Height: 5\' 4"  (1.626 m)    GENERAL: The patient is a well-nourished male, in no acute distress. The vital signs are documented above. CARDIAC: There is a regular rate and rhythm.  VASCULAR: I cannot palpate a radial pulse on either side however the hands are warm and well-perfused.  He has a good thrill in his right upper arm fistula.  It is not pulsatile.  It is aneurysmal with 2 large aneurysms as documented in the photographs below.    PULMONARY: There is good air exchange bilaterally without wheezing or rales. ABDOMEN: Soft and non-tender with normal  pitched bowel sounds.  MUSCULOSKELETAL: There are no major deformities or cyanosis. NEUROLOGIC: No focal weakness or paresthesias are detected. SKIN: There are no ulcers or rashes noted. PSYCHIATRIC: The patient has a normal affect.  DATA:    ARTERIAL DOPPLER STUDY: I have independently interpreted his arterial Doppler study.  On the right side there is a biphasic posterior tibial signal with a monophasic dorsalis pedis signal.  ABI is 99% with a toe pressure of 101 mmHg.  On the left side there is a biphasic dorsalis pedis and posterior tibial signal.  ABI could not be obtained as the arteries are not compressible.

## 2019-10-30 ENCOUNTER — Other Ambulatory Visit: Payer: Self-pay

## 2019-11-13 NOTE — Progress Notes (Signed)
Attempt made to call patient to conduct pre-op phone call. Left VM.

## 2019-11-16 ENCOUNTER — Encounter (HOSPITAL_COMMUNITY): Admission: RE | Payer: Self-pay | Source: Ambulatory Visit

## 2019-11-16 ENCOUNTER — Ambulatory Visit (HOSPITAL_COMMUNITY): Admission: RE | Admit: 2019-11-16 | Payer: Medicare Other | Source: Ambulatory Visit | Admitting: Vascular Surgery

## 2019-11-16 SURGERY — LIGATION OF ARTERIOVENOUS  FISTULA
Anesthesia: Monitor Anesthesia Care | Laterality: Right

## 2019-11-18 ENCOUNTER — Other Ambulatory Visit: Payer: Self-pay

## 2019-11-27 ENCOUNTER — Encounter (HOSPITAL_COMMUNITY): Payer: Self-pay | Admitting: Vascular Surgery

## 2019-11-27 ENCOUNTER — Other Ambulatory Visit: Payer: Self-pay

## 2019-11-27 NOTE — Progress Notes (Signed)
Patient does not have transportation to covid drive thru testing site. Will test patient DOS.

## 2019-11-28 ENCOUNTER — Other Ambulatory Visit (HOSPITAL_COMMUNITY): Payer: Medicare Other

## 2019-12-01 ENCOUNTER — Other Ambulatory Visit: Payer: Self-pay

## 2019-12-01 ENCOUNTER — Ambulatory Visit (HOSPITAL_COMMUNITY)
Admission: RE | Admit: 2019-12-01 | Discharge: 2019-12-01 | Disposition: A | Discharge status: Home / Self Care | Payer: Medicare Other | Attending: Vascular Surgery | Admitting: Vascular Surgery

## 2019-12-01 ENCOUNTER — Encounter (HOSPITAL_COMMUNITY): Payer: Self-pay | Admitting: Vascular Surgery

## 2019-12-01 ENCOUNTER — Ambulatory Visit (HOSPITAL_COMMUNITY): Payer: Medicare Other

## 2019-12-01 ENCOUNTER — Encounter (HOSPITAL_COMMUNITY)
Admission: RE | Disposition: A | Discharge status: Home / Self Care | Payer: Self-pay | Source: Home / Self Care | Attending: Vascular Surgery

## 2019-12-01 DIAGNOSIS — Z79891 Long term (current) use of opiate analgesic: Secondary | ICD-10-CM | POA: Diagnosis not present

## 2019-12-01 DIAGNOSIS — I120 Hypertensive chronic kidney disease with stage 5 chronic kidney disease or end stage renal disease: Secondary | ICD-10-CM | POA: Diagnosis not present

## 2019-12-01 DIAGNOSIS — Z992 Dependence on renal dialysis: Secondary | ICD-10-CM | POA: Insufficient documentation

## 2019-12-01 DIAGNOSIS — Z8551 Personal history of malignant neoplasm of bladder: Secondary | ICD-10-CM | POA: Insufficient documentation

## 2019-12-01 DIAGNOSIS — I2510 Atherosclerotic heart disease of native coronary artery without angina pectoris: Secondary | ICD-10-CM | POA: Insufficient documentation

## 2019-12-01 DIAGNOSIS — Z79899 Other long term (current) drug therapy: Secondary | ICD-10-CM | POA: Insufficient documentation

## 2019-12-01 DIAGNOSIS — T82898A Other specified complication of vascular prosthetic devices, implants and grafts, initial encounter: Secondary | ICD-10-CM | POA: Diagnosis present

## 2019-12-01 DIAGNOSIS — Y832 Surgical operation with anastomosis, bypass or graft as the cause of abnormal reaction of the patient, or of later complication, without mention of misadventure at the time of the procedure: Secondary | ICD-10-CM | POA: Insufficient documentation

## 2019-12-01 DIAGNOSIS — Z888 Allergy status to other drugs, medicaments and biological substances status: Secondary | ICD-10-CM | POA: Diagnosis not present

## 2019-12-01 DIAGNOSIS — Z955 Presence of coronary angioplasty implant and graft: Secondary | ICD-10-CM | POA: Diagnosis not present

## 2019-12-01 DIAGNOSIS — I739 Peripheral vascular disease, unspecified: Secondary | ICD-10-CM | POA: Insufficient documentation

## 2019-12-01 DIAGNOSIS — N186 End stage renal disease: Secondary | ICD-10-CM | POA: Diagnosis not present

## 2019-12-01 DIAGNOSIS — Z882 Allergy status to sulfonamides status: Secondary | ICD-10-CM | POA: Diagnosis not present

## 2019-12-01 DIAGNOSIS — Z20822 Contact with and (suspected) exposure to covid-19: Secondary | ICD-10-CM | POA: Insufficient documentation

## 2019-12-01 DIAGNOSIS — Z87891 Personal history of nicotine dependence: Secondary | ICD-10-CM | POA: Insufficient documentation

## 2019-12-01 DIAGNOSIS — Z7982 Long term (current) use of aspirin: Secondary | ICD-10-CM | POA: Insufficient documentation

## 2019-12-01 HISTORY — DX: End stage renal disease: N18.6

## 2019-12-01 HISTORY — DX: Personal history of other diseases of the digestive system: Z87.19

## 2019-12-01 HISTORY — PX: FISTULA SUPERFICIALIZATION: SHX6341

## 2019-12-01 LAB — POCT I-STAT, CHEM 8
BUN: 32 mg/dL — ABNORMAL HIGH (ref 8–23)
Calcium, Ion: 1.15 mmol/L (ref 1.15–1.40)
Chloride: 99 mmol/L (ref 98–111)
Creatinine, Ser: 8.2 mg/dL — ABNORMAL HIGH (ref 0.61–1.24)
Glucose, Bld: 76 mg/dL (ref 70–99)
HCT: 38 % — ABNORMAL LOW (ref 39.0–52.0)
Hemoglobin: 12.9 g/dL — ABNORMAL LOW (ref 13.0–17.0)
Potassium: 5 mmol/L (ref 3.5–5.1)
Sodium: 140 mmol/L (ref 135–145)
TCO2: 29 mmol/L (ref 22–32)

## 2019-12-01 LAB — SARS CORONAVIRUS 2 BY RT PCR (HOSPITAL ORDER, PERFORMED IN ~~LOC~~ HOSPITAL LAB): SARS Coronavirus 2: NEGATIVE

## 2019-12-01 SURGERY — FISTULA SUPERFICIALIZATION
Anesthesia: Monitor Anesthesia Care | Site: Arm Upper | Laterality: Right

## 2019-12-01 MED ORDER — MIDAZOLAM HCL 2 MG/2ML IJ SOLN
INTRAMUSCULAR | Status: AC
  Filled 2019-12-01: qty 2

## 2019-12-01 MED ORDER — SODIUM CHLORIDE 0.9 % IV SOLN
INTRAVENOUS | Status: AC
  Filled 2019-12-01: qty 1.2

## 2019-12-01 MED ORDER — MIDAZOLAM HCL 5 MG/5ML IJ SOLN
INTRAMUSCULAR | Status: DC | PRN
  Administered 2019-12-01: .5 mg via INTRAVENOUS

## 2019-12-01 MED ORDER — OXYCODONE HCL 5 MG PO TABS: 5.0000 mg | ORAL_TABLET | Freq: Once | ORAL | Status: DC | PRN

## 2019-12-01 MED ORDER — LIDOCAINE HCL (PF) 1 % IJ SOLN
INTRAMUSCULAR | Status: AC
  Filled 2019-12-01: qty 30

## 2019-12-01 MED ORDER — OXYCODONE HCL 5 MG PO TABS: 5.0000 mg | ORAL_TABLET | ORAL | 0 refills | Status: DC | PRN

## 2019-12-01 MED ORDER — ONDANSETRON HCL 4 MG/2ML IJ SOLN: 4.0000 mg | Freq: Once | INTRAMUSCULAR | Status: DC | PRN

## 2019-12-01 MED ORDER — LIDOCAINE-EPINEPHRINE (PF) 1 %-1:200000 IJ SOLN
INTRAMUSCULAR | Status: DC | PRN
  Administered 2019-12-01: 11 mL

## 2019-12-01 MED ORDER — LIDOCAINE 2% (20 MG/ML) 5 ML SYRINGE
INTRAMUSCULAR | Status: AC
  Filled 2019-12-01: qty 5

## 2019-12-01 MED ORDER — LIDOCAINE-EPINEPHRINE (PF) 1 %-1:200000 IJ SOLN
INTRAMUSCULAR | Status: AC
  Filled 2019-12-01: qty 30

## 2019-12-01 MED ORDER — FENTANYL CITRATE (PF) 100 MCG/2ML IJ SOLN: 25.0000 ug | INTRAMUSCULAR | Status: DC | PRN

## 2019-12-01 MED ORDER — SODIUM CHLORIDE 0.9 % IV SOLN
INTRAVENOUS | Status: DC
  Administered 2019-12-01: 10 mL/h via INTRAVENOUS

## 2019-12-01 MED ORDER — 0.9 % SODIUM CHLORIDE (POUR BTL) OPTIME
TOPICAL | Status: DC | PRN
  Administered 2019-12-01: 1000 mL

## 2019-12-01 MED ORDER — ONDANSETRON HCL 4 MG/2ML IJ SOLN
INTRAMUSCULAR | Status: AC
  Filled 2019-12-01: qty 2

## 2019-12-01 MED ORDER — CHLORHEXIDINE GLUCONATE 0.12 % MT SOLN
15.0000 mL | Freq: Once | OROMUCOSAL | Status: AC
  Administered 2019-12-01: 15 mL via OROMUCOSAL
  Filled 2019-12-01: qty 15

## 2019-12-01 MED ORDER — OXYCODONE HCL 5 MG/5ML PO SOLN: 5.0000 mg | Freq: Once | ORAL | Status: DC | PRN

## 2019-12-01 MED ORDER — PHENYLEPHRINE HCL-NACL 10-0.9 MG/250ML-% IV SOLN
INTRAVENOUS | Status: DC | PRN
  Administered 2019-12-01: 20 ug/min via INTRAVENOUS

## 2019-12-01 MED ORDER — PROPOFOL 500 MG/50ML IV EMUL
INTRAVENOUS | Status: DC | PRN
  Administered 2019-12-01: 75 ug/kg/min via INTRAVENOUS

## 2019-12-01 MED ORDER — CHLORHEXIDINE GLUCONATE 4 % EX LIQD: 60.0000 mL | Freq: Once | CUTANEOUS | Status: DC

## 2019-12-01 MED ORDER — DEXAMETHASONE SODIUM PHOSPHATE 10 MG/ML IJ SOLN
INTRAMUSCULAR | Status: DC | PRN
  Administered 2019-12-01: 5 mg via INTRAVENOUS

## 2019-12-01 MED ORDER — FENTANYL CITRATE (PF) 100 MCG/2ML IJ SOLN
INTRAMUSCULAR | Status: DC | PRN
  Administered 2019-12-01 (×2): 25 ug via INTRAVENOUS

## 2019-12-01 MED ORDER — CEFAZOLIN SODIUM-DEXTROSE 2-4 GM/100ML-% IV SOLN
2.0000 g | INTRAVENOUS | Status: AC
  Administered 2019-12-01: 2 g via INTRAVENOUS
  Filled 2019-12-01: qty 100

## 2019-12-01 MED ORDER — LIDOCAINE 2% (20 MG/ML) 5 ML SYRINGE
INTRAMUSCULAR | Status: DC | PRN
  Administered 2019-12-01: 40 mg via INTRAVENOUS

## 2019-12-01 MED ORDER — ONDANSETRON HCL 4 MG/2ML IJ SOLN
INTRAMUSCULAR | Status: DC | PRN
  Administered 2019-12-01: 4 mg via INTRAVENOUS

## 2019-12-01 MED ORDER — ORAL CARE MOUTH RINSE: 15.0000 mL | Freq: Once | OROMUCOSAL | Status: AC

## 2019-12-01 MED ORDER — FENTANYL CITRATE (PF) 250 MCG/5ML IJ SOLN
INTRAMUSCULAR | Status: AC
  Filled 2019-12-01: qty 5

## 2019-12-01 MED ORDER — LIDOCAINE HCL (PF) 1 % IJ SOLN
INTRAMUSCULAR | Status: DC | PRN
  Administered 2019-12-01: 6 mL

## 2019-12-01 SURGICAL SUPPLY — 34 items
ARMBAND PINK RESTRICT EXTREMIT (MISCELLANEOUS) ×2 IMPLANT
CANISTER SUCT 3000ML PPV (MISCELLANEOUS) ×2 IMPLANT
CANNULA VESSEL 3MM 2 BLNT TIP (CANNULA) ×2 IMPLANT
CLIP VESOCCLUDE MED 6/CT (CLIP) ×2 IMPLANT
CLIP VESOCCLUDE SM WIDE 6/CT (CLIP) ×2 IMPLANT
COVER PROBE W GEL 5X96 (DRAPES) ×2 IMPLANT
COVER WAND RF STERILE (DRAPES) IMPLANT
DECANTER SPIKE VIAL GLASS SM (MISCELLANEOUS) ×2 IMPLANT
DERMABOND ADVANCED (GAUZE/BANDAGES/DRESSINGS) ×1
DERMABOND ADVANCED .7 DNX12 (GAUZE/BANDAGES/DRESSINGS) ×1 IMPLANT
ELECT REM PT RETURN 9FT ADLT (ELECTROSURGICAL) ×2
ELECTRODE REM PT RTRN 9FT ADLT (ELECTROSURGICAL) ×1 IMPLANT
GLOVE BIO SURGEON STRL SZ7.5 (GLOVE) ×2 IMPLANT
GLOVE BIOGEL PI IND STRL 8 (GLOVE) ×1 IMPLANT
GLOVE BIOGEL PI INDICATOR 8 (GLOVE) ×1
GOWN STRL REUS W/ TWL LRG LVL3 (GOWN DISPOSABLE) ×3 IMPLANT
GOWN STRL REUS W/TWL LRG LVL3 (GOWN DISPOSABLE) ×3
KIT BASIN OR (CUSTOM PROCEDURE TRAY) ×2 IMPLANT
KIT TURNOVER KIT B (KITS) ×2 IMPLANT
NS IRRIG 1000ML POUR BTL (IV SOLUTION) ×2 IMPLANT
PACK CV ACCESS (CUSTOM PROCEDURE TRAY) ×2 IMPLANT
PAD ARMBOARD 7.5X6 YLW CONV (MISCELLANEOUS) ×4 IMPLANT
SPONGE LAP 18X18 RF (DISPOSABLE) ×2 IMPLANT
SPONGE SURGIFOAM ABS GEL 100 (HEMOSTASIS) IMPLANT
SUT PROLENE 5 0 C 1 24 (SUTURE) ×12 IMPLANT
SUT PROLENE 5 0 C 1 36 (SUTURE) IMPLANT
SUT PROLENE 6 0 BV (SUTURE) ×2 IMPLANT
SUT VIC AB 3-0 SH 27 (SUTURE) ×1
SUT VIC AB 3-0 SH 27X BRD (SUTURE) ×1 IMPLANT
SUT VIC AB 4-0 PS2 18 (SUTURE) ×2 IMPLANT
SUT VICRYL 4-0 PS2 18IN ABS (SUTURE) ×2 IMPLANT
TOWEL GREEN STERILE (TOWEL DISPOSABLE) ×2 IMPLANT
UNDERPAD 30X36 HEAVY ABSORB (UNDERPADS AND DIAPERS) ×2 IMPLANT
WATER STERILE IRR 1000ML POUR (IV SOLUTION) ×2 IMPLANT

## 2019-12-01 NOTE — Anesthesia Postprocedure Evaluation (Signed)
Anesthesia Post Note  Patient: Logan Martin  Procedure(s) Performed: PLICATION OF ANEURYSM OF ARTERIOVENOUS FISTULA (Right Arm Upper)     Patient location during evaluation: PACU Anesthesia Type: MAC Level of consciousness: awake and alert Pain management: pain level controlled Vital Signs Assessment: post-procedure vital signs reviewed and stable Respiratory status: spontaneous breathing, nonlabored ventilation and respiratory function stable Cardiovascular status: stable and blood pressure returned to baseline Anesthetic complications: no   No complications documented.  Last Vitals:  Vitals:   12/01/19 1240 12/01/19 1256  BP: (!) 165/73 (!) 152/79  Pulse: 73 76  Resp: 17 19  Temp:  (!) 36.2 C  SpO2: 98% 97%    Last Pain:  Vitals:   12/01/19 1240  TempSrc:   PainSc: 0-No pain                 Audry Pili

## 2019-12-01 NOTE — H&P (Signed)
REASON FOR HOSPITALIZATION:    For plication of AVF right arm.  ASSESSMENT & PLAN:   END-STAGE RENAL DISEASE: This patient's history is somewhat complicated.  He had 2 failed grafts in the left arm.  He has an AV fistula in the right upper arm which has had since 2007 that was placed in [place].  This is markedly aneurysmal.  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  I think he should be able to continue to use his fistula on the right although he has some areas where the skin is thinning out over the fistula.  I have recommended staged plication of 2 large aneurysms in the fistula.  We would start with the more peripheral aneurysm and then once this is healed address the more centrally located aneurysm.  I have discussed with him the indications of the procedure and the potential complications and he is agreeable to proceed.  Given that the fistula has an excellent thrill I do not think a preoperative fistulogram is necessary.  [name], MD Office: [phone]   HPI:   Logan Martin is a pleasant 64 y.o. male, who was referred to be evaluated for new access.  He had a right upper arm fistula placed in [place] in 2007 however this is markedly aneurysmal.  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  He does home dialysis and dialyzes on Mondays Tuesdays Thursdays and Fridays.  He is not sure where the SVC syndrome was diagnosed but has had worked both here at [employer] and also in [place].  He denies any recent uremic symptoms.      Past Medical History:  Diagnosis Date  . Alcohol abuse    quit date in 1995  . Anemia   . Cancer ([place])    bladder  . Chronic low back pain   . 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 Started HD in [place].  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  On NxStage home hemo since 2010. Using RUA AVF.    [name] ESRD on dialysis ([place])    "TTS; [facility]" (06/14/2016)  . Hearing aid worn    B/L  . Hepatitis C    s/p treatment with Mavyret  . History of blood transfusion 2007 X 1  . History of kidney stones   . Hypertension   . Left inguinal hernia   . Peripheral vascular disease ([place])   . Pulmonary embolism [facility]) Feb 2012   Treated with coumadin x 1 year  . Renal insufficiency   . Wears glasses          Family History  Problem Relation Age of Onset  . Cancer Mother   . Diabetes Mother   . Hypertension Mother   . Deep vein thrombosis Daughter     SOCIAL HISTORY: Social History        Socioeconomic History  . Marital status: Single    Spouse name: Not on file  . Number of children: Not on file  . Years of education: Not on file  . Highest education level: Not on file  Occupational History  . Not on file  Tobacco Use  . Smoking status: Former Smoker    Packs/day: 1.00    Years: 5.00    Pack years: 5.00    Types: Cigarettes    Quit date: 05/28/1993    Years since quitting: 26.4  . Smokeless tobacco: Never Used  Substance and Sexual Activity  . Alcohol use: Not Currently    Comment:  "nothing since 1995"  . Drug use: Yes    Types: Marijuana, Cocaine    Comment: denies 03/02/19  . Sexual activity: Yes  Other Topics Concern  . Not on file  Social History Narrative  . Not on file   Social Determinants of Health      Financial Resource Strain:   . Difficulty of Paying Living Expenses:   Food Insecurity:   . Worried About Charity fundraiser in the Last Year:   . Arboriculturist in the Last Year:   Transportation Needs:   . Film/video editor (Medical):   [name] Lack of Transportation (Non-Medical):   Physical Activity:   . Days of Exercise per Week:   . Minutes of  Exercise per Session:   Stress:   . Feeling of Stress :   Social Connections:   . Frequency of Communication with Friends and Family:   . Frequency of Social Gatherings with Friends and Family:   . Attends Religious Services:   . Active Member of Clubs or Organizations:   . Attends Archivist Meetings:   [name] Marital Status:   Intimate Partner Violence:   . Fear of Current or Ex-Partner:   . Emotionally Abused:   [name] Physically Abused:   . Sexually Abused:          Allergies  Allergen Reactions  . Heparin Rash    Pork products   . Pork-Derived Products Rash  . Sulfa Antibiotics Rash          Current Outpatient Medications  Medication Sig Dispense Refill  . aspirin EC 81 MG tablet Take 81 mg by mouth at bedtime.     [name] atorvastatin (LIPITOR) 10 MG tablet Take 10 mg by mouth daily.    . calcitRIOL (ROCALTROL) 0.5 MCG capsule Take 1.5 mcg by mouth daily.  3  . calcium acetate (PHOSLO) 667 MG capsule Take 1,334 mg by mouth 3 (three) times daily with meals.     [name] ibuprofen (ADVIL) 800 MG tablet Take 1 tablet (800 mg total) by mouth every 8 (eight) hours as needed. 30 tablet 0  . labetalol (NORMODYNE) 200 MG tablet Take 400 mg by mouth 2 (two) times daily.    [name] lidocaine-prilocaine (EMLA) cream Apply 1 application topically See admin instructions. Apply small amount to dialysis port (AVF) one hour before dialysis. Cover with occlusive dressing (saran wrap)  6  . metoprolol succinate (TOPROL-XL) 50 MG 24 hr tablet Take 50 mg by mouth daily. Take with or immediately following a meal.    . Multiple Vitamin (MULTIVITAMIN WITH MINERALS) TABS tablet Take 1 tablet by mouth daily.    [name] oxyCODONE (OXY IR/ROXICODONE) 5 MG immediate release tablet Take 1 tablet (5 mg total) by mouth every 6 (six) hours as needed for severe pain. 15 tablet 0  . oxycodone (ROXICODONE) 30 MG immediate release tablet Take 30 mg by mouth 3 (three) times daily.    . Oxycodone HCl 10 MG TABS  Take 10 mg by mouth 3 (three) times daily as needed (breakthrough pain).   0  . isosorbide mononitrate (IMDUR)  30 MG 24 hr tablet Take 30 mg by mouth daily.      No current facility-administered medications for this visit.    REVIEW OF SYSTEMS:  [X]  denotes positive finding, [ ]  denotes negative finding Cardiac  Comments:  Chest pain or chest pressure:    Shortness of breath upon exertion:    Short of breath when lying flat:    Irregular heart rhythm:        Vascular    Pain in calf, thigh, or hip brought on by ambulation:    Pain in feet at night that wakes you up from your sleep:     Blood clot in your veins:    Leg swelling:         Pulmonary    Oxygen at home:    Productive cough:     Wheezing:         Neurologic    Sudden weakness in arms or legs:     Sudden numbness in arms or legs:     Sudden onset of difficulty speaking or slurred speech:    Temporary loss of vision in one eye:     Problems with dizziness:         Gastrointestinal    Blood in stool:     Vomited blood:         Genitourinary    Burning when urinating:     Blood in urine:        Psychiatric    Major depression:         Hematologic    Bleeding problems:    Problems with blood clotting too easily:        Skin    Rashes or ulcers:        Constitutional    Fever or chills:     PHYSICAL EXAM:      Vitals:   10/28/19 1326  BP: (!) 148/73  Pulse: 96  Resp: 20  Temp: 97.7 F (36.5 C)  Weight: 110 lb (49.9 kg)  Height: 5\' 4"  (1.626 m)    GENERAL: The patient is a well-nourished male, in no acute distress. The vital signs are documented above. CARDIAC: There is a regular rate and rhythm.  VASCULAR: I cannot palpate a radial pulse on either side however the hands are warm and well-perfused.  He has a good thrill in his right upper arm fistula.  It is not pulsatile.  It is aneurysmal  with 2 large aneurysms as documented in the photographs below.    PULMONARY: There is good air exchange bilaterally without wheezing or rales. ABDOMEN: Soft and non-tender with normal pitched bowel sounds.  MUSCULOSKELETAL: There are no major deformities or cyanosis. NEUROLOGIC: No focal weakness or paresthesias are detected. SKIN: There are no ulcers or rashes noted. PSYCHIATRIC: The patient has a normal affect.  DATA:    ARTERIAL DOPPLER STUDY: I have independently interpreted his arterial Doppler study.  On the right side there is a biphasic posterior tibial signal with a monophasic dorsalis pedis signal.  ABI is 99% with a toe pressure of 101 mmHg.  On the left side there is a biphasic dorsalis pedis and posterior tibial signal.  ABI could not be obtained as the arteries are not compressible.

## 2019-12-01 NOTE — Op Note (Signed)
    NAME: Logan Martin    MRN: [id number] DOB: [birth year]    DATE OF OPERATION: 12/01/2019  PREOP DIAGNOSIS:    Aneurysmal right upper arm fistula  POSTOP DIAGNOSIS:    Same  PROCEDURE:    Plication of large aneurysm of right upper arm fistula  SURGEON: Judeth Cornfield. Scot Dock, MD  ASSIST: Arlee Muslim, PA  ANESTHESIA: Local with sedation  EBL: Minimal  INDICATIONS:    Logan Martin is a 64 y.o. male with a markedly aneurysmal right upper arm fistula.  I elected to address the 2 largest aneurysms in a staged fashion.  He presents for plication of the more peripheral aneurysm.  FINDINGS:   Good thrill at the completion of the procedure.  TECHNIQUE:   The patient was taken to the operating room and sedated by anesthesia.  The right arm was prepped and draped in usual sterile fashion.  An elliptical incision was marked encompassing the more distal aneurysm.  The skin was anesthetized with 1% lidocaine with epinephrine.  The elliptical incision was made in the aneurysm dissected free circumferentially.  Patient had a heparin allergy.  The vein was clamped proximally and distally.  A large ellipse of the aneurysm was excised.  The vein was then sewn back with running 5-0 Prolene suture.  I then rolled the fistula slightly to try to protect the suture line intact the vein laterally.  Hemostasis was obtained in the wound.  The wound was then closed with a 4-0 Vicryl suture.  Sterile dressing was applied.  The patient tolerated the procedure well transferred to the recovery room in stable condition.  All needle and sponge counts were correct.  Given the complexity of the case a first assistant was necessary in order to expedient the procedure and safely perform the technical aspects of the operation.  [name], MD, FACS [facility]  DATE OF DICTATION:   12/01/2019

## 2019-12-01 NOTE — Transfer of Care (Signed)
Immediate Anesthesia Transfer of Care Note  Patient: Logan Martin  Procedure(s) Performed: PLICATION OF ANEURYSM OF ARTERIOVENOUS FISTULA (Right Arm Upper)  Patient Location: PACU  Anesthesia Type:MAC  Level of Consciousness: drowsy  Airway & Oxygen Therapy: Patient Spontanous Breathing and Patient connected to face mask oxygen  Post-op Assessment: Report given to RN and Post -op Vital signs reviewed and stable  Post vital signs: Reviewed and stable  Last Vitals:  Vitals Value Taken Time  BP 122/66 12/01/19 1210  Temp    Pulse 99 12/01/19 1214  Resp 15 12/01/19 1214  SpO2 100 % 12/01/19 1214  Vitals shown include unvalidated device data.  Last Pain:  Vitals:   12/01/19 0817  TempSrc:   PainSc: 0-No pain      Patients Stated Pain Goal: 4 (07/68/08 8110)  Complications: No complications documented.

## 2019-12-01 NOTE — Anesthesia Procedure Notes (Addendum)
Procedure Name: MAC Date/Time: 12/01/2019 10:45 AM Performed by: Imagene Riches, CRNA Pre-anesthesia Checklist: Patient identified, Emergency Drugs available, Suction available, Patient being monitored and Timeout performed Patient Re-evaluated:Patient Re-evaluated prior to induction Oxygen Delivery Method: Simple face mask Ventilation: Oral airway inserted - appropriate to patient size

## 2019-12-01 NOTE — Anesthesia Preprocedure Evaluation (Addendum)
Anesthesia Evaluation  Patient identified by MRN, date of birth, ID band Patient awake    Reviewed: Allergy & Precautions, NPO status , Patient's Chart, lab work & pertinent test results  History of Anesthesia Complications Negative for: history of anesthetic complications  Airway Mallampati: II  TM Distance: >3 FB Neck ROM: Full    Dental  (+) Dental Advisory Given   Pulmonary former smoker, PE (2012)   Pulmonary exam normal        Cardiovascular hypertension, Pt. on medications (-) angina+ CAD, + Cardiac Stents and + Peripheral Vascular Disease  Normal cardiovascular exam   Hx SVC syndrome    Neuro/Psych PSYCHIATRIC DISORDERS negative neurological ROS     GI/Hepatic GERD  Controlled,(+)     substance abuse  alcohol use and cocaine use, Hepatitis - (s/p tx), C Denies cocaine or alcohol for >20 years    Endo/Other  negative endocrine ROS  Renal/GU ESRF and DialysisRenal disease     Musculoskeletal negative musculoskeletal ROS (+) narcotic dependent  Abdominal   Peds  Hematology  (+) anemia ,   Anesthesia Other Findings Covid test negative    Reproductive/Obstetrics                            Anesthesia Physical Anesthesia Plan  ASA: III  Anesthesia Plan: MAC   Post-op Pain Management:    Induction: Intravenous  PONV Risk Score and Plan: 1 and Propofol infusion and Treatment may vary due to age or medical condition  Airway Management Planned: Natural Airway and Simple Face Mask  Additional Equipment: None  Intra-op Plan:   Post-operative Plan:   Informed Consent: I have reviewed the patients History and Physical, chart, labs and discussed the procedure including the risks, benefits and alternatives for the proposed anesthesia with the patient or authorized representative who has indicated his/her understanding and acceptance.       Plan Discussed with: CRNA and  Anesthesiologist  Anesthesia Plan Comments:        Anesthesia Quick Evaluation

## 2019-12-02 ENCOUNTER — Encounter (HOSPITAL_COMMUNITY): Payer: Self-pay | Admitting: Vascular Surgery

## 2019-12-14 ENCOUNTER — Encounter (HOSPITAL_COMMUNITY): Payer: Self-pay | Admitting: Vascular Surgery

## 2020-01-13 ENCOUNTER — Ambulatory Visit: Payer: Medicare Other | Admitting: Vascular Surgery

## 2020-03-09 ENCOUNTER — Emergency Department (HOSPITAL_COMMUNITY)
Admission: EM | Admit: 2020-03-09 | Discharge: 2020-03-09 | Disposition: A | Discharge status: Home / Self Care | Payer: Medicare Other | Source: Home / Self Care

## 2020-03-09 ENCOUNTER — Other Ambulatory Visit: Payer: Self-pay

## 2020-03-09 DIAGNOSIS — T82590A Other mechanical complication of surgically created arteriovenous fistula, initial encounter: Secondary | ICD-10-CM | POA: Insufficient documentation

## 2020-03-09 DIAGNOSIS — S41101D Unspecified open wound of right upper arm, subsequent encounter: Secondary | ICD-10-CM | POA: Insufficient documentation

## 2020-03-09 DIAGNOSIS — L0390 Cellulitis, unspecified: Secondary | ICD-10-CM | POA: Diagnosis not present

## 2020-03-09 DIAGNOSIS — Z5321 Procedure and treatment not carried out due to patient leaving prior to being seen by health care provider: Secondary | ICD-10-CM | POA: Insufficient documentation

## 2020-03-09 DIAGNOSIS — T827XXA Infection and inflammatory reaction due to other cardiac and vascular devices, implants and grafts, initial encounter: Secondary | ICD-10-CM | POA: Diagnosis not present

## 2020-03-09 NOTE — ED Notes (Signed)
Called to reasses vitals, no answer

## 2020-03-09 NOTE — ED Notes (Signed)
Pt called x 3 , no answer

## 2020-03-09 NOTE — ED Notes (Signed)
Pt called for vitals with no response.

## 2020-03-09 NOTE — ED Triage Notes (Signed)
Pt here via EMS for eval of swelling to R arm fistula since yesterday. Pt used same for home dialysis last night but the kidney center sent him here for further eval after he presented there for blood work and medication this morning.

## 2020-03-10 ENCOUNTER — Inpatient Hospital Stay (HOSPITAL_COMMUNITY)
Admission: EM | Admit: 2020-03-10 | Discharge: 2020-03-17 | DRG: 252 | Disposition: A | Discharge status: Home / Self Care | Payer: Medicare Other | Attending: Internal Medicine | Admitting: Internal Medicine

## 2020-03-10 ENCOUNTER — Emergency Department (HOSPITAL_COMMUNITY): Payer: Medicare Other

## 2020-03-10 ENCOUNTER — Encounter (HOSPITAL_COMMUNITY): Payer: Self-pay | Admitting: *Deleted

## 2020-03-10 DIAGNOSIS — R52 Pain, unspecified: Secondary | ICD-10-CM | POA: Diagnosis not present

## 2020-03-10 DIAGNOSIS — I739 Peripheral vascular disease, unspecified: Secondary | ICD-10-CM

## 2020-03-10 DIAGNOSIS — M545 Low back pain, unspecified: Secondary | ICD-10-CM

## 2020-03-10 DIAGNOSIS — L0390 Cellulitis, unspecified: Secondary | ICD-10-CM | POA: Diagnosis present

## 2020-03-10 DIAGNOSIS — D6959 Other secondary thrombocytopenia: Secondary | ICD-10-CM | POA: Diagnosis present

## 2020-03-10 DIAGNOSIS — Z87891 Personal history of nicotine dependence: Secondary | ICD-10-CM

## 2020-03-10 DIAGNOSIS — Z992 Dependence on renal dialysis: Secondary | ICD-10-CM | POA: Diagnosis not present

## 2020-03-10 DIAGNOSIS — E785 Hyperlipidemia, unspecified: Secondary | ICD-10-CM | POA: Diagnosis present

## 2020-03-10 DIAGNOSIS — B182 Chronic viral hepatitis C: Secondary | ICD-10-CM | POA: Diagnosis present

## 2020-03-10 DIAGNOSIS — D696 Thrombocytopenia, unspecified: Secondary | ICD-10-CM | POA: Diagnosis present

## 2020-03-10 DIAGNOSIS — L03113 Cellulitis of right upper limb: Secondary | ICD-10-CM | POA: Diagnosis present

## 2020-03-10 DIAGNOSIS — Y832 Surgical operation with anastomosis, bypass or graft as the cause of abnormal reaction of the patient, or of later complication, without mention of misadventure at the time of the procedure: Secondary | ICD-10-CM | POA: Diagnosis present

## 2020-03-10 DIAGNOSIS — Z79899 Other long term (current) drug therapy: Secondary | ICD-10-CM

## 2020-03-10 DIAGNOSIS — Z419 Encounter for procedure for purposes other than remedying health state, unspecified: Secondary | ICD-10-CM

## 2020-03-10 DIAGNOSIS — Z86711 Personal history of pulmonary embolism: Secondary | ICD-10-CM

## 2020-03-10 DIAGNOSIS — F1411 Cocaine abuse, in remission: Secondary | ICD-10-CM | POA: Diagnosis present

## 2020-03-10 DIAGNOSIS — N189 Chronic kidney disease, unspecified: Secondary | ICD-10-CM | POA: Diagnosis present

## 2020-03-10 DIAGNOSIS — Z8551 Personal history of malignant neoplasm of bladder: Secondary | ICD-10-CM | POA: Diagnosis not present

## 2020-03-10 DIAGNOSIS — I2583 Coronary atherosclerosis due to lipid rich plaque: Secondary | ICD-10-CM

## 2020-03-10 DIAGNOSIS — D631 Anemia in chronic kidney disease: Secondary | ICD-10-CM | POA: Diagnosis present

## 2020-03-10 DIAGNOSIS — N2581 Secondary hyperparathyroidism of renal origin: Secondary | ICD-10-CM | POA: Diagnosis present

## 2020-03-10 DIAGNOSIS — M7989 Other specified soft tissue disorders: Secondary | ICD-10-CM | POA: Diagnosis not present

## 2020-03-10 DIAGNOSIS — Z955 Presence of coronary angioplasty implant and graft: Secondary | ICD-10-CM

## 2020-03-10 DIAGNOSIS — G8929 Other chronic pain: Secondary | ICD-10-CM

## 2020-03-10 DIAGNOSIS — I2510 Atherosclerotic heart disease of native coronary artery without angina pectoris: Secondary | ICD-10-CM | POA: Diagnosis present

## 2020-03-10 DIAGNOSIS — K219 Gastro-esophageal reflux disease without esophagitis: Secondary | ICD-10-CM | POA: Diagnosis present

## 2020-03-10 DIAGNOSIS — T829XXA Unspecified complication of cardiac and vascular prosthetic device, implant and graft, initial encounter: Secondary | ICD-10-CM

## 2020-03-10 DIAGNOSIS — E8889 Other specified metabolic disorders: Secondary | ICD-10-CM | POA: Diagnosis present

## 2020-03-10 DIAGNOSIS — Z79891 Long term (current) use of opiate analgesic: Secondary | ICD-10-CM | POA: Diagnosis not present

## 2020-03-10 DIAGNOSIS — I120 Hypertensive chronic kidney disease with stage 5 chronic kidney disease or end stage renal disease: Secondary | ICD-10-CM | POA: Diagnosis present

## 2020-03-10 DIAGNOSIS — T82898A Other specified complication of vascular prosthetic devices, implants and grafts, initial encounter: Secondary | ICD-10-CM | POA: Diagnosis not present

## 2020-03-10 DIAGNOSIS — N186 End stage renal disease: Secondary | ICD-10-CM | POA: Diagnosis present

## 2020-03-10 DIAGNOSIS — T827XXA Infection and inflammatory reaction due to other cardiac and vascular devices, implants and grafts, initial encounter: Principal | ICD-10-CM | POA: Diagnosis present

## 2020-03-10 DIAGNOSIS — Z7982 Long term (current) use of aspirin: Secondary | ICD-10-CM

## 2020-03-10 DIAGNOSIS — Z20822 Contact with and (suspected) exposure to covid-19: Secondary | ICD-10-CM | POA: Diagnosis present

## 2020-03-10 LAB — CBC WITH DIFFERENTIAL/PLATELET
Abs Immature Granulocytes: 0.01 10*3/uL (ref 0.00–0.07)
Basophils Absolute: 0 10*3/uL (ref 0.0–0.1)
Basophils Relative: 1 %
Eosinophils Absolute: 0.5 10*3/uL (ref 0.0–0.5)
Eosinophils Relative: 13 %
HCT: 27.3 % — ABNORMAL LOW (ref 39.0–52.0)
Hemoglobin: 8.4 g/dL — ABNORMAL LOW (ref 13.0–17.0)
Immature Granulocytes: 0 %
Lymphocytes Relative: 41 %
Lymphs Abs: 1.5 10*3/uL (ref 0.7–4.0)
MCH: 30.5 pg (ref 26.0–34.0)
MCHC: 30.8 g/dL (ref 30.0–36.0)
MCV: 99.3 fL (ref 80.0–100.0)
Monocytes Absolute: 0.5 10*3/uL (ref 0.1–1.0)
Monocytes Relative: 12 %
Neutro Abs: 1.2 10*3/uL — ABNORMAL LOW (ref 1.7–7.7)
Neutrophils Relative %: 33 %
Platelets: 82 10*3/uL — ABNORMAL LOW (ref 150–400)
RBC: 2.75 MIL/uL — ABNORMAL LOW (ref 4.22–5.81)
RDW: 16.3 % — ABNORMAL HIGH (ref 11.5–15.5)
WBC: 3.6 10*3/uL — ABNORMAL LOW (ref 4.0–10.5)
nRBC: 0 % (ref 0.0–0.2)

## 2020-03-10 LAB — MAGNESIUM
Magnesium: 2.1 mg/dL (ref 1.7–2.4)
Magnesium: 1.9 mg/dL (ref 1.7–2.4)

## 2020-03-10 LAB — COMPREHENSIVE METABOLIC PANEL
ALT: 11 U/L (ref 0–44)
AST: 18 U/L (ref 15–41)
Albumin: 3.6 g/dL (ref 3.5–5.0)
Alkaline Phosphatase: 73 U/L (ref 38–126)
Anion gap: 14 (ref 5–15)
BUN: 57 mg/dL — ABNORMAL HIGH (ref 8–23)
CO2: 32 mmol/L (ref 22–32)
Calcium: 10.3 mg/dL (ref 8.9–10.3)
Chloride: 92 mmol/L — ABNORMAL LOW (ref 98–111)
Creatinine, Ser: 7.46 mg/dL — ABNORMAL HIGH (ref 0.61–1.24)
GFR, Estimated: 7 mL/min — ABNORMAL LOW (ref >60)
Glucose, Bld: 90 mg/dL (ref 70–99)
Potassium: 4.4 mmol/L (ref 3.5–5.1)
Sodium: 138 mmol/L (ref 135–145)
Total Bilirubin: 0.7 mg/dL (ref 0.3–1.2)
Total Protein: 7.1 g/dL (ref 6.5–8.1)

## 2020-03-10 LAB — LACTIC ACID, PLASMA
Lactic Acid, Venous: 1.1 mmol/L (ref 0.5–1.9)
Lactic Acid, Venous: 2.2 mmol/L (ref 0.5–1.9)

## 2020-03-10 LAB — RESPIRATORY PANEL BY RT PCR (FLU A&B, COVID)
Influenza A by PCR: NEGATIVE
Influenza B by PCR: NEGATIVE
SARS Coronavirus 2 by RT PCR: NEGATIVE

## 2020-03-10 LAB — PROTIME-INR
INR: 1.1 (ref 0.8–1.2)
Prothrombin Time: 13.8 seconds (ref 11.4–15.2)

## 2020-03-10 LAB — HIV ANTIBODY (ROUTINE TESTING W REFLEX): HIV Screen 4th Generation wRfx: NONREACTIVE

## 2020-03-10 MED ORDER — METOPROLOL SUCCINATE ER 50 MG PO TB24
50.0000 mg | ORAL_TABLET | Freq: Every day | ORAL | Status: DC
  Administered 2020-03-11 – 2020-03-17 (×7): 50 mg via ORAL
  Filled 2020-03-10 (×8): qty 1

## 2020-03-10 MED ORDER — HYDRALAZINE HCL 20 MG/ML IJ SOLN
10.0000 mg | Freq: Four times a day (QID) | INTRAMUSCULAR | Status: DC | PRN
  Administered 2020-03-12 – 2020-03-13 (×2): 10 mg via INTRAVENOUS
  Filled 2020-03-10 (×4): qty 1

## 2020-03-10 MED ORDER — ATORVASTATIN CALCIUM 40 MG PO TABS
40.0000 mg | ORAL_TABLET | Freq: Every day | ORAL | Status: DC
  Administered 2020-03-10 – 2020-03-17 (×7): 40 mg via ORAL
  Filled 2020-03-10 (×9): qty 1

## 2020-03-10 MED ORDER — ACETAMINOPHEN 325 MG PO TABS
650.0000 mg | ORAL_TABLET | Freq: Four times a day (QID) | ORAL | Status: DC | PRN
  Filled 2020-03-10: qty 2

## 2020-03-10 MED ORDER — OXYCODONE HCL 5 MG PO TABS
5.0000 mg | ORAL_TABLET | ORAL | Status: DC | PRN
  Administered 2020-03-10 – 2020-03-14 (×6): 5 mg via ORAL
  Filled 2020-03-10 (×7): qty 1

## 2020-03-10 MED ORDER — SUCROFERRIC OXYHYDROXIDE 500 MG PO CHEW
1000.0000 mg | CHEWABLE_TABLET | Freq: Three times a day (TID) | ORAL | Status: DC
  Filled 2020-03-10: qty 2

## 2020-03-10 MED ORDER — ACETAMINOPHEN 650 MG RE SUPP: 650.0000 mg | Freq: Four times a day (QID) | RECTAL | Status: DC | PRN

## 2020-03-10 MED ORDER — ISOSORBIDE MONONITRATE ER 30 MG PO TB24
30.0000 mg | ORAL_TABLET | Freq: Every day | ORAL | Status: DC
  Administered 2020-03-10 – 2020-03-17 (×7): 30 mg via ORAL
  Filled 2020-03-10 (×8): qty 1

## 2020-03-10 MED ORDER — CALCITRIOL 0.5 MCG PO CAPS
1.5000 ug | ORAL_CAPSULE | Freq: Every day | ORAL | Status: DC
  Filled 2020-03-10: qty 3

## 2020-03-10 MED ORDER — ONDANSETRON HCL 4 MG/2ML IJ SOLN
4.0000 mg | Freq: Four times a day (QID) | INTRAMUSCULAR | Status: DC | PRN
  Administered 2020-03-11: 4 mg via INTRAVENOUS
  Filled 2020-03-10 (×2): qty 2

## 2020-03-10 MED ORDER — VANCOMYCIN HCL IN DEXTROSE 1-5 GM/200ML-% IV SOLN
1000.0000 mg | Freq: Once | INTRAVENOUS | Status: AC
  Administered 2020-03-10: 1000 mg via INTRAVENOUS
  Filled 2020-03-10: qty 200

## 2020-03-10 MED ORDER — HYDROMORPHONE HCL 1 MG/ML IJ SOLN
0.5000 mg | INTRAMUSCULAR | Status: DC | PRN
  Administered 2020-03-11 – 2020-03-16 (×4): 1 mg via INTRAVENOUS
  Filled 2020-03-10 (×4): qty 1

## 2020-03-10 MED ORDER — ASPIRIN EC 81 MG PO TBEC
81.0000 mg | DELAYED_RELEASE_TABLET | Freq: Every day | ORAL | Status: DC
  Administered 2020-03-10 – 2020-03-16 (×7): 81 mg via ORAL
  Filled 2020-03-10 (×7): qty 1

## 2020-03-10 MED ORDER — FERRIC CITRATE 1 GM 210 MG(FE) PO TABS
420.0000 mg | ORAL_TABLET | Freq: Three times a day (TID) | ORAL | Status: DC
  Administered 2020-03-10 – 2020-03-17 (×10): 420 mg via ORAL
  Filled 2020-03-10 (×15): qty 2

## 2020-03-10 MED ORDER — CALCIUM ACETATE (PHOS BINDER) 667 MG PO CAPS: 1334.0000 mg | ORAL_CAPSULE | Freq: Three times a day (TID) | ORAL | Status: DC

## 2020-03-10 MED ORDER — VANCOMYCIN VARIABLE DOSE PER UNSTABLE RENAL FUNCTION (PHARMACIST DOSING): Status: DC

## 2020-03-10 MED ORDER — ONDANSETRON HCL 4 MG PO TABS: 4.0000 mg | ORAL_TABLET | Freq: Four times a day (QID) | ORAL | Status: DC | PRN

## 2020-03-10 NOTE — Progress Notes (Signed)
CRITICAL VALUE ALERT  Critical Value: lactic acid 2.2  Date & Time Notied:  03/10/2020  18/25  Provider Notified: Early Osmond  Orders Received/Actions taken: no new orders at this time. Will continue to monitor the pt.

## 2020-03-10 NOTE — Progress Notes (Signed)
NEW ADMISSION NOTE New Admission Note:   Arrival Method: stretcher from ED  Mental Orientation: axox4 Telemetry:41m03  Assessment: Completed Skin: see skin assessment  IV: L FA Pain:denies Tubes:none Safety Measures: Safety Fall Prevention Plan has been discussed  Admission: Completed [facility] Orientation: Patient has been orientated to the room, unit and staff.  Family: none at the bedside   Orders have been reviewed and implemented. Will continue to monitor the patient. Call light has been placed within reach and bed alarm has been activated.   Paulla Fore, RN, BSN

## 2020-03-10 NOTE — Progress Notes (Signed)
VASCULAR LAB    Duplex of right AVF has been performed.  See CV proc for preliminary results.   Monnie Anspach, RVT 03/10/2020, 2:12 PM

## 2020-03-10 NOTE — Consult Note (Signed)
Hospital Consult    Reason for Consult: Concern for right upper arm AV fistula infection Referring Physician: Dr. Roslynn Amble MRN #:  [id number]  History of Present Illness: This is a 64 y.o. male history of end-stage renal disease.  He is on home dialysis via right arm AV fistula.  This was revised as recently as July.  After his last surgery he was able to cannulate the upper parts of the fistula at home.  More recently he has been cannulating both above and below an area of eschar.  States that he did have a fever yesterday he had cultures taken at dialysis center.  Has swelling in the right upper arm concern for infection he was sent to the ER for antibiotics and evaluation.  He does have some pain in the right upper arm.  Overall feeling well at this time.  Does not take blood thinners.  Last dialysis was Tuesday.  Past Medical History:  Diagnosis Date  . Alcohol abuse    quit date in 1995  . Anemia   . Cancer ([place])    bladder  . Chronic low back pain   . Cocaine abuse in remission ([place])    Quit date in 1995  . ESRD (end stage renal disease) ([place])    Home Dialysis  . 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 Started HD in [place], [place].  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  On NxStage home hemo since 2010. Using RUA AVF.    [name] Hearing aid worn    B/L  . Hepatitis C    s/p treatment with Mavyret  . History of blood transfusion 2007 X 1  . History of kidney stones   . Hx of constipation   . Hypertension   . Left inguinal hernia   . Peripheral vascular disease ([place])   . Pulmonary embolism [facility]) Feb 2012   Treated with coumadin x 1 year  . Wears glasses     Past Surgical History:  Procedure Laterality Date  . A/V FISTULAGRAM N/A 11/22/2017   Procedure: A/V Fistulagram;  Surgeon: Serafina Mitchell, MD;  Location: [place] [facility];  Service: Cardiovascular;  Laterality: N/A;  rt. arm   . ABDOMINAL HERNIA REPAIR  2000    x 2  . AV  FISTULA PLACEMENT Right 2007   upper arm  . AV FISTULA REPAIR Right ~ 03/2016 & 04/2016  . BLADDER AUGMENTATION  1975  . COLONOSCOPY    . CORONARY ANGIOPLASTY WITH STENT PLACEMENT  1999  . CYSTOSCOPY W/ URETERAL STENT PLACEMENT N/A 10/13/2017   Procedure: CYSTOSCOPY WITH IRRIGATION OF BLADDER PLACEMENT OF FOLEY;  Surgeon: Ceasar Mons, MD;  Location: [place];  Service: Urology;  Laterality: N/A;  . CYSTOSCOPY WITH FULGERATION N/A 03/27/2018   Procedure: [place] AND MUCOUS EVACUATION, BLADDER BIOPSY;  Surgeon: Franchot Gallo, MD;  [facility];  Service: Urology;  Laterality: N/A;  . ESOPHAGOGASTRODUODENOSCOPY    . EXTRACORPOREAL SHOCK WAVE LITHOTRIPSY    . FISTULA SUPERFICIALIZATION Right 01/29/4966   Procedure: PLICATION OF ANEURYSM OF ARTERIOVENOUS FISTULA;  Surgeon: Angelia Mould, MD;  Location: [place];  Service: Vascular;  Laterality: Right;  . HERNIA REPAIR    . INGUINAL HERNIA REPAIR Right 1975  . INGUINAL HERNIA REPAIR Left 03/04/2019   Procedure: LEFT OPEN INGUINAL HERNIA REPAIR WITH MESH;  Surgeon: Kinsinger, Arta Bruce, MD;  Location: [place];  Service: General;  Laterality: Left;  GENERAL AND  LMA  . PERIPHERAL VASCULAR BALLOON ANGIOPLASTY  11/22/2017   Procedure: PERIPHERAL VASCULAR BALLOON ANGIOPLASTY;  Surgeon: Serafina Mitchell, MD;  Location: [place] [facility];  Service: Cardiovascular;;  rt. arm fistula  . PERIPHERAL VASCULAR CATHETERIZATION N/A 06/22/2016   Procedure: Left Arm Venography;  Surgeon: Elam Dutch, MD;  Location: [place] [facility];  Service: Cardiovascular;  Laterality: N/A;  . PERIPHERAL VASCULAR CATHETERIZATION N/A 06/22/2016   Procedure: A/V Fistulagram - Right Arm;  Surgeon: Elam Dutch, MD;  Location: [place] [facility];  Service: Cardiovascular;  Laterality: N/A;  . PERIPHERAL VASCULAR CATHETERIZATION Right 06/22/2016   Procedure: Peripheral Vascular Balloon Angioplasty;  Surgeon: Elam Dutch, MD;   Location: [place] [facility];  Service: Cardiovascular;  Laterality: Right;  arm fistula    Allergies  Allergen Reactions  . Heparin Rash    Pork products   . Pork-Derived Products Rash  . Sulfa Antibiotics Rash    Prior to Admission medications   Medication Sig Start Date End Date Taking? Authorizing Provider  aspirin EC 81 MG tablet Take 81 mg by mouth at bedtime.     [provider]  atorvastatin (LIPITOR) 10 MG tablet Take 10 mg by mouth daily. 08/07/18   [provider]  calcitRIOL (ROCALTROL) 0.5 MCG capsule Take 1.5 mcg by mouth daily. 10/25/17   [provider]  calcium acetate (PHOSLO) 667 MG capsule Take 1,334 mg by mouth 3 (three) times daily with meals.     [provider]  ibuprofen (ADVIL) 800 MG tablet Take 1 tablet (800 mg total) by mouth every 8 (eight) hours as needed. Patient not taking: Reported on 11/03/2019 03/04/19   Kinsinger, Arta Bruce, MD  isosorbide mononitrate (IMDUR) 30 MG 24 hr tablet Take 30 mg by mouth daily.  08/15/18 12/01/19  [provider]  lidocaine-prilocaine (EMLA) cream Apply 1 application topically See admin instructions. Apply small amount to dialysis port (AVF) one hour before dialysis. Cover with occlusive dressing (saran wrap) 11/14/17   [provider]  Multiple Vitamin (MULTIVITAMIN WITH MINERALS) TABS tablet Take 1 tablet by mouth daily.    [provider]  oxyCODONE (OXY IR/ROXICODONE) 5 MG immediate release tablet Take 1 tablet (5 mg total) by mouth every 6 (six) hours as needed for severe pain. 03/04/19   Kinsinger, Arta Bruce, MD  oxycodone (ROXICODONE) 30 MG immediate release tablet Take 30 mg by mouth every 8 (eight) hours as needed for pain.  08/26/18   [provider]  oxyCODONE (ROXICODONE) 5 MG immediate release tablet Take 1 tablet (5 mg total) by mouth every 4 (four) hours as needed. 12/01/19   Angelia Mould, MD  Oxycodone HCl 10 MG TABS Take 10 mg by mouth in the  morning and at bedtime.  05/22/16   [provider]    Social History   Socioeconomic History  . Marital status: Single    Spouse name: Not on file  . Number of children: Not on file  . Years of education: Not on file  . Highest education level: Not on file  Occupational History  . Not on file  Tobacco Use  . Smoking status: Former Smoker    Packs/day: 1.00    Years: 5.00    Pack years: 5.00    Types: Cigarettes    Quit date: 05/28/1993    Years since quitting: 26.8  . Smokeless tobacco: Never Used  Vaping Use  . Vaping Use: Never used  Substance and  Sexual Activity  . Alcohol use: Not Currently    Comment:  "nothing since 1995"  . Drug use: Not Currently    Types: Marijuana, Cocaine    Comment: denies 03/02/19  . Sexual activity: Yes  Other Topics Concern  . Not on file  Social History Narrative  . Not on file   Social Determinants of Health   Financial Resource Strain:   . Difficulty of Paying Living Expenses: Not on file  Food Insecurity:   . Worried About Charity fundraiser in the Last Year: Not on file  . Ran Out of Food in the Last Year: Not on file  Transportation Needs:   . Lack of Transportation (Medical): Not on file  . Lack of Transportation (Non-Medical): Not on file  Physical Activity:   . Days of Exercise per Week: Not on file  . Minutes of Exercise per Session: Not on file  Stress:   . Feeling of Stress : Not on file  Social Connections:   . Frequency of Communication with Friends and Family: Not on file  . Frequency of Social Gatherings with Friends and Family: Not on file  . Attends Religious Services: Not on file  . Active Member of Clubs or Organizations: Not on file  . Attends Archivist Meetings: Not on file  . Marital Status: Not on file  Intimate Partner Violence:   . Fear of Current or Ex-Partner: Not on file  . Emotionally Abused: Not on file  . Physically Abused: Not on file  . Sexually Abused: Not on file     Family History  Problem Relation Age of Onset  . Cancer Mother   . Diabetes Mother   . Hypertension Mother   . Deep vein thrombosis Daughter     ROS: Cardiovascular: []  chest pain/pressure []  palpitations []  SOB lying flat []  DOE [x]  pain and swelling in right upper extremity []  pain in legs at rest []  pain in legs at night [] x non-healing ulcers []  hx of DVT []  swelling in legs  Pulmonary: []  productive cough []  asthma/wheezing []  home O2  Neurologic: []  weakness in []  arms []  legs []  numbness in []  arms []  legs []  hx of CVA []  mini stroke [] difficulty speaking or slurred speech []  temporary loss of vision in one eye []  dizziness  Hematologic: []  hx of cancer []  bleeding problems []  problems with blood clotting easily  Endocrine:   []  diabetes []  thyroid disease  GI []  vomiting blood []  blood in stool  GU: [x]  CKD/renal failure []  HD--[]  M/W/F or [x]  T/T/S []  burning with urination []  blood in urine  Psychiatric: []  anxiety []  depression  Musculoskeletal: []  arthritis []  joint pain  Integumentary: []  rashes []  ulcers  Constitutional: [x]  fever []  chills   Physical Examination  Vitals:   03/10/20 1145 03/10/20 1150  BP: (!) 171/77   Pulse: 82 82  Resp:    Temp:    SpO2: 100% 100%   Body mass index is 18.58 kg/m.  General:  nad HENT: WNL, normocephalic Pulmonary: normal non-labored breathing Cardiac: palpable radial pulses Abdomen: soft, NT/ND, no masses Extremities: Right upper extremity fistula with central eschar as noted above.  There is no bleeding.  There is no drainage.  There is no erythema.  There is warmth and swelling around the proximal fistula.  There is a strong thrill within the fistula Neurologic: A&O X 3; Appropriate Affect ; SENSATION: normal; MOTOR FUNCTION:  moving all extremities equally. Speech is  fluent/normal  CBC    Component Value Date/Time   WBC 3.6 (L) 03/10/2020 0144   RBC 2.75 (L) 03/10/2020  0144   HGB 8.4 (L) 03/10/2020 0144   HCT 27.3 (L) 03/10/2020 0144   PLT 82 (L) 03/10/2020 0144   MCV 99.3 03/10/2020 0144   MCH 30.5 03/10/2020 0144   MCHC 30.8 03/10/2020 0144   RDW 16.3 (H) 03/10/2020 0144   LYMPHSABS 1.5 03/10/2020 0144   MONOABS 0.5 03/10/2020 0144   EOSABS 0.5 03/10/2020 0144   BASOSABS 0.0 03/10/2020 0144    BMET    Component Value Date/Time   NA 138 03/10/2020 0144   K 4.4 03/10/2020 0144   CL 92 (L) 03/10/2020 0144   CO2 32 03/10/2020 0144   GLUCOSE 90 03/10/2020 0144   BUN 57 (H) 03/10/2020 0144   CREATININE 7.46 (H) 03/10/2020 0144   CREATININE 5.98 (H) 03/28/2017 1638   CALCIUM 10.3 03/10/2020 0144   GFRNONAA 7 (L) 03/10/2020 0144   GFRAA 9 (L) 03/04/2019 0840    COAGS: Lab Results  Component Value Date   INR 1.1 03/10/2020   INR 1.07 11/21/2017   INR 1.10 12/06/2010     Non-Invasive Vascular Imaging:   Dialysis duplex ordered  ASSESSMENT/PLAN: This is a 64 y.o. male with end-stage renal disease concern for right upper extremity fistula infection.  I discussed the patient's case with the emergency department as well as with nephrology.  At this time does not appear to need any emergent surgery does have what appears to be a chronic eschar in the midportion of the fistula that he has not been cannulating.  We will get a duplex to evaluate the swelling of the proximal fistula to see if there is any fluid collections that would need to be drained although there is no fluctuance to suggest a large fluid collection.  He will be admitted with IV antibiotics.  If he needs dialysis in the near future would likely need to be a temporary catheter given concern for infection.  Ultimately he will likely rule require revision of the fistula with tunneled dialysis catheter placement to allow to rest.  Chi Garlow C. Donzetta Matters, MD [facility] Office: [phone] Pager: [phone]

## 2020-03-10 NOTE — ED Notes (Signed)
Pt transported to vascular.  °

## 2020-03-10 NOTE — Consult Note (Addendum)
[facility] Renal Consultation Note    Indication for Consultation:  Management of ESRD/hemodialysis; anemia, hypertension/volume and secondary hyperparathyroidism  Primary nephrologist: Joelyn Oms    HPI: [facility] is a 64 y.o. male with ESRD on home HD (Nx Stage), HTN, Hep C, PVD. 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 Home dialysis since 2009.  He was sent to to ED from the dialysis center yesterday with swelling and drainage from his RUE fistula. The outpatient center did collect cultures of the wound drainage.  Afebrile on arrival. Labs: K 4.4, BUN 57, Cr 7.46, WBC 3.6, Hgb 8.4. Blood cultures collected and IV antibiotics ordered. Vascular surgery consulted - duplex studies pending. S/p plication of large aneurysm of  Fistula in 11/2019. Of note, home dialysis RN reports patient vomiting coffee ground emesis with clots at the center yesterday.   Seen and examined in ED.He reports subjective fevers at home, painful access arm with drainage from AVF x 2 days. He denies any prior hematemesis episodes. He does endorse some SOB today. Last dialysis 10/12.   Past Medical History:  Diagnosis Date  . Alcohol abuse    quit date in 1995  . Anemia   . Cancer ([place])    bladder  . Chronic low back pain   . Cocaine abuse in remission ([place])    Quit date in 1995  . ESRD (end stage renal disease) ([place])    Home Dialysis  . 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 Started HD in [place], [place].  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  On NxStage home hemo since 2010. Using RUA AVF.    [name] Hearing aid worn    B/L  . Hepatitis C    s/p treatment with Mavyret  . History of blood transfusion 2007 X 1  . History of kidney stones   . Hx of constipation   . Hypertension   . Left inguinal hernia   . Peripheral vascular disease ([place])   . Pulmonary embolism [facility]) Feb 2012   Treated with coumadin x 1 year  . Wears glasses    Past  Surgical History:  Procedure Laterality Date  . A/V FISTULAGRAM N/A 11/22/2017   Procedure: A/V Fistulagram;  Surgeon: Serafina Mitchell, MD;  Location: [place] [facility];  Service: Cardiovascular;  Laterality: N/A;  rt. arm   . ABDOMINAL HERNIA REPAIR  2000    x 2  . AV FISTULA PLACEMENT Right 2007   upper arm  . AV FISTULA REPAIR Right ~ 03/2016 & 04/2016  . BLADDER AUGMENTATION  1975  . COLONOSCOPY    . CORONARY ANGIOPLASTY WITH STENT PLACEMENT  1999  . CYSTOSCOPY W/ URETERAL STENT PLACEMENT N/A 10/13/2017   Procedure: CYSTOSCOPY WITH IRRIGATION OF BLADDER PLACEMENT OF FOLEY;  Surgeon: Ceasar Mons, MD;  Location: [place];  Service: Urology;  Laterality: N/A;  . CYSTOSCOPY WITH FULGERATION N/A 03/27/2018   Procedure: [place] AND MUCOUS EVACUATION, BLADDER BIOPSY;  Surgeon: Franchot Gallo, MD;  [facility];  Service: Urology;  Laterality: N/A;  . ESOPHAGOGASTRODUODENOSCOPY    . EXTRACORPOREAL SHOCK WAVE LITHOTRIPSY    . FISTULA SUPERFICIALIZATION Right 12/27/4233   Procedure: PLICATION OF ANEURYSM OF ARTERIOVENOUS FISTULA;  Surgeon: Angelia Mould, MD;  Location: [place];  Service: Vascular;  Laterality: Right;  . HERNIA REPAIR    . INGUINAL HERNIA REPAIR Right 1975  . INGUINAL HERNIA REPAIR Left 03/04/2019   Procedure:  LEFT OPEN INGUINAL HERNIA REPAIR WITH MESH;  Surgeon: Kinsinger, Arta Bruce, MD;  Location: [place];  Service: General;  Laterality: Left;  GENERAL AND LMA  . PERIPHERAL VASCULAR BALLOON ANGIOPLASTY  11/22/2017   Procedure: PERIPHERAL VASCULAR BALLOON ANGIOPLASTY;  Surgeon: Serafina Mitchell, MD;  Location: [place] [facility];  Service: Cardiovascular;;  rt. arm fistula  . PERIPHERAL VASCULAR CATHETERIZATION N/A 06/22/2016   Procedure: Left Arm Venography;  Surgeon: Elam Dutch, MD;  Location: [place] [facility];  Service: Cardiovascular;  Laterality: N/A;  . PERIPHERAL VASCULAR CATHETERIZATION N/A 06/22/2016   Procedure: A/V  Fistulagram - Right Arm;  Surgeon: Elam Dutch, MD;  Location: [place] [facility];  Service: Cardiovascular;  Laterality: N/A;  . PERIPHERAL VASCULAR CATHETERIZATION Right 06/22/2016   Procedure: Peripheral Vascular Balloon Angioplasty;  Surgeon: Elam Dutch, MD;  Location: [place] [facility];  Service: Cardiovascular;  Laterality: Right;  arm fistula   Family History  Problem Relation Age of Onset  . Cancer Mother   . Diabetes Mother   . Hypertension Mother   . Deep vein thrombosis Daughter    Social History:  reports that he quit smoking about 26 years ago. His smoking use included cigarettes. He has a 5.00 pack-year smoking history. He has never used smokeless tobacco. He reports previous alcohol use. He reports previous drug use. Drugs: Marijuana and Cocaine. Allergies  Allergen Reactions  . Heparin Rash    Pork products   . Pork-Derived Products Rash  . Sulfa Antibiotics Rash   Prior to Admission medications   Medication Sig Start Date End Date Taking? Authorizing Provider  aspirin EC 81 MG tablet Take 81 mg by mouth at bedtime.    Yes [provider]  atorvastatin (LIPITOR) 40 MG tablet Take 40 mg by mouth daily. 01/20/20  Yes [provider]  AURYXIA 1 GM 210 MG(Fe) tablet Take 420 mg by mouth 3 (three) times daily. 02/12/20  Yes [provider]  calcitRIOL (ROCALTROL) 0.5 MCG capsule Take 1.5 mcg by mouth daily. 10/25/17  Yes [provider]  calcium acetate (PHOSLO) 667 MG capsule Take 1,334 mg by mouth 3 (three) times daily with meals.    Yes [provider]  isosorbide mononitrate (IMDUR) 30 MG 24 hr tablet Take 30 mg by mouth daily.  08/15/18 03/10/20 Yes [provider]  lidocaine-prilocaine (EMLA) cream Apply 1 application topically See admin instructions. Apply small amount to dialysis port (AVF) one hour before dialysis. Cover with occlusive dressing (saran wrap) 11/14/17  Yes [provider]  metoprolol  succinate (TOPROL-XL) 50 MG 24 hr tablet Take 50 mg by mouth daily. 02/16/20  Yes [provider]  oxycodone (ROXICODONE) 30 MG immediate release tablet Take 30 mg by mouth 3 (three) times daily.  08/26/18  Yes [provider]  Oxycodone HCl 10 MG TABS Take 10 mg by mouth daily as needed (breakthrough pain).  05/22/16  Yes [provider]  VELPHORO 500 MG chewable tablet Chew 1,000 mg by mouth 3 (three) times daily. 02/05/20  Yes [provider]  ibuprofen (ADVIL) 800 MG tablet Take 1 tablet (800 mg total) by mouth every 8 (eight) hours as needed. Patient not taking: Reported on 11/03/2019 03/04/19   Kinsinger, Arta Bruce, MD  oxyCODONE (OXY IR/ROXICODONE) 5 MG immediate release tablet Take 1 tablet (5 mg total) by mouth every 6 (six) hours as needed for severe pain. Patient not taking: Reported on 03/10/2020 03/04/19   Kinsinger, Arta Bruce,  MD  oxyCODONE (ROXICODONE) 5 MG immediate release tablet Take 1 tablet (5 mg total) by mouth every 4 (four) hours as needed. Patient not taking: Reported on 03/10/2020 12/01/19   Angelia Mould, MD   Current Facility-Administered Medications  Medication Dose Route Frequency Provider Last Rate Last Admin  . acetaminophen (TYLENOL) tablet 650 mg  650 mg Oral Q6H PRN Pahwani, Rinka R, MD       Or  . acetaminophen (TYLENOL) suppository 650 mg  650 mg Rectal Q6H PRN Pahwani, Rinka R, MD      . hydrALAZINE (APRESOLINE) injection 10 mg  10 mg Intravenous Q6H PRN Pahwani, Rinka R, MD      . HYDROmorphone (DILAUDID) injection 0.5-1 mg  0.5-1 mg Intravenous Q2H PRN Pahwani, Rinka R, MD      . ondansetron (ZOFRAN) tablet 4 mg  4 mg Oral Q6H PRN Pahwani, Rinka R, MD       Or  . ondansetron (ZOFRAN) injection 4 mg  4 mg Intravenous Q6H PRN Pahwani, Rinka R, MD      . oxyCODONE (Oxy IR/ROXICODONE) immediate release tablet 5 mg  5 mg Oral Q4H PRN Pahwani, Rinka R, MD      . vancomycin (VANCOCIN) IVPB 1000 mg/200 mL premix  1,000 mg  Intravenous Once von Dohlen, Haley B, RPH      . [START ON 03/11/2020] vancomycin variable dose per unstable renal function (pharmacist dosing)   Does not apply See admin instructions von Caren Griffins, [facility]       Current Outpatient Medications  Medication Sig Dispense Refill  . aspirin EC 81 MG tablet Take 81 mg by mouth at bedtime.     [name] atorvastatin (LIPITOR) 40 MG tablet Take 40 mg by mouth daily.    Lorin Picket 1 GM 210 MG(Fe) tablet Take 420 mg by mouth 3 (three) times daily.    . calcitRIOL (ROCALTROL) 0.5 MCG capsule Take 1.5 mcg by mouth daily.  3  . calcium acetate (PHOSLO) 667 MG capsule Take 1,334 mg by mouth 3 (three) times daily with meals.     . isosorbide mononitrate (IMDUR) 30 MG 24 hr tablet Take 30 mg by mouth daily.     [name] lidocaine-prilocaine (EMLA) cream Apply 1 application topically See admin instructions. Apply small amount to dialysis port (AVF) one hour before dialysis. Cover with occlusive dressing (saran wrap)  6  . metoprolol succinate (TOPROL-XL) 50 MG 24 hr tablet Take 50 mg by mouth daily.    [name] oxycodone (ROXICODONE) 30 MG immediate release tablet Take 30 mg by mouth 3 (three) times daily.     . Oxycodone HCl 10 MG TABS Take 10 mg by mouth daily as needed (breakthrough pain).   0  . VELPHORO 500 MG chewable tablet Chew 1,000 mg by mouth 3 (three) times daily.    [name] ibuprofen (ADVIL) 800 MG tablet Take 1 tablet (800 mg total) by mouth every 8 (eight) hours as needed. (Patient not taking: Reported on 11/03/2019) 30 tablet 0  . oxyCODONE (OXY IR/ROXICODONE) 5 MG immediate release tablet Take 1 tablet (5 mg total) by mouth every 6 (six) hours as needed for severe pain. (Patient not taking: Reported on 03/10/2020) 15 tablet 0  . oxyCODONE (ROXICODONE) 5 MG immediate release tablet Take 1 tablet (5 mg total) by mouth every 4 (four) hours as needed. (Patient not taking: Reported on 03/10/2020) 15 tablet 0     ROS: As per HPI otherwise negative.  Physical Exam: Vitals:  03/10/20 1145 03/10/20 1150 03/10/20 1215 03/10/20 1245  BP: (!) 171/77  (!) 170/67 (!) 175/71  Pulse: 82 82 83 82  Resp:      Temp:      TempSrc:      SpO2: 100% 100% 100% 100%  Weight:      Height:         General: Non-toxic appearing, nad  Head: NCAT sclera not icteric  Neck: Supple. No JVD appreciated  Lungs: Clear bilaterally, no wheeze, rales  Heart: RRR with S1 S2 Abdomen: soft non-tender, no ascites  Lower extremities:without edema or ischemic changes, no open wounds  Neuro: A & O  X 3. Moves all extremities spontaneously. Psych:  Responds to questions appropriately with a normal affect. Dialysis Access: RUE AVF +bruit, +swelling, tenderness; no drainage   Labs: Basic Metabolic Panel: Recent Labs  Lab 03/10/20 0144  NA 138  K 4.4  CL 92*  CO2 32  GLUCOSE 90  BUN 57*  CREATININE 7.46*  CALCIUM 10.3   Liver Function Tests: Recent Labs  Lab 03/10/20 0144  AST 18  ALT 11  ALKPHOS 73  BILITOT 0.7  PROT 7.1  ALBUMIN 3.6   No results for input(s): LIPASE, AMYLASE in the last 168 hours. No results for input(s): AMMONIA in the last 168 hours. CBC: Recent Labs  Lab 03/10/20 0144  WBC 3.6*  NEUTROABS 1.2*  HGB 8.4*  HCT 27.3*  MCV 99.3  PLT 82*   Cardiac Enzymes: No results for input(s): CKTOTAL, CKMB, CKMBINDEX, TROPONINI in the last 168 hours. CBG: No results for input(s): GLUCAP in the last 168 hours. Iron Studies: No results for input(s): IRON, TIBC, TRANSFERRIN, FERRITIN in the last 72 hours. Studies/Results: DG Chest 2 View  Result Date: 03/10/2020 CLINICAL DATA:  Right upper extremity swelling.  Possible sepsis. EXAM: CHEST - 2 VIEW COMPARISON:  06/17/2018 FINDINGS: Mild cardiomegaly. Both lungs are clear. The visualized skeletal structures are unremarkable. IMPRESSION: Mild cardiomegaly without acute airspace disease. Electronically Signed   By: Ulyses Jarred M.D.   On: 03/10/2020 01:55    Dialysis Orders:  Home HD NxStage  MTTS EDW 50 kg   OP Labs 10/7: Hgb 9.8, K 7.1!, Ca 10.7, P 5.3,  PTH 209   Assessment/Plan: 1.  Infected RUE AVF -  1. VVS consulted - duplex studies pending. Likely to require revision of fistula with TDC 2. Blood cx pending. IV antibiotics ordered. Wound Cx collected OP center 10/13 -pending 3. Consult IR for temp cath placement for dialysis in the am.  2.  ESRD -  HomeHD. Last HD 10/12. No urgent dialysis needs today. Next dialysis once temp cath placed.  3. Hypertension/volume  - BP elevated. Continue home meds. UF with HD as tolerated 4.  Anemia  - Hgb 8.4. Received Aranesp 200 at outpatient center 21/30.  5.  Metabolic bone disease -  Continue Auryixa binder. Hold calcitriol for now d/t elevated Ca 6.  Hematemesis  - coffee ground emesis with clots x 1 reported at outpatient center 10/13.   Lynnda Child PA-C [facility] 03/10/2020, 1:31 PM

## 2020-03-10 NOTE — Progress Notes (Signed)
Pharmacy Antibiotic Note  Logan Martin is a 64 y.o. male admitted on 03/10/2020 with cellulitis of dialysis fistula site.  Pharmacy has been consulted for vancomycin dosing. ESRD on HD TTS - last HD Tues PTA. Patient sent from HD to ER today for infection/antibiotics.  Plan: Vancomycin 1g IV x 1; then 500mg  IV qHD Monitor clinical progress, c/s, abx plan/LOT Pre-HD vancomycin level as indicated F/u HD schedule/tolerance inpatient to enter maintenance doses   Height: 5\' 4"  (162.6 cm) Weight: 49.1 kg (108 lb 3.9 oz) IBW/kg (Calculated) : 59.2  Temp (24hrs), Avg:98.5 F (36.9 C), Min:98.2 F (36.8 C), Max:98.8 F (37.1 C)  Recent Labs  Lab 03/10/20 0144  WBC 3.6*  CREATININE 7.46*  LATICACIDVEN 1.1    Estimated Creatinine Clearance: 6.9 mL/min (A) (by C-G formula based on SCr of 7.46 mg/dL (H)).    Allergies  Allergen Reactions  . Heparin Rash    Pork products   . Pork-Derived Products Rash  . Sulfa Antibiotics Rash     Arturo Morton, PharmD, BCPS Please check AMION for all [facility] contact numbers Clinical Pharmacist 03/10/2020 12:42 PM

## 2020-03-10 NOTE — ED Triage Notes (Signed)
The pt was here yesterday  For the same but left after he was called home

## 2020-03-10 NOTE — ED Provider Notes (Signed)
[facility] EMERGENCY DEPARTMENT Provider Note   CSN: [id number] Arrival date & time: 03/10/20  0042     History Chief Complaint  Patient presents with  . infected dialysis graft    Logan Martin is a 64 y.o. male.  Presents to ER with concern for possible infected dialysis fistula.  Reports she has a right arm fistula that was placed and 2007, has required multiple revisions.  Followed by Dr. Doren Custard.  Around 1 month ago he noted a mild scab to the site, has been steadily getting worse, not improving.  A couple days ago he noted a small amount of pus from the area.  Reports that his fistula has been functioning without any difficulty and he has been using a site just to the side of the area of concern.  He went to dialysis center today, dialysis nurse was concerned about infection and recommended that he go to ER for assessment.  He denies any associated redness, fevers, chills or other new complaints.  July 2021, Dr. 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   HPI     Past Medical History:  Diagnosis Date  . Alcohol abuse    quit date in 1995  . Anemia   . Cancer ([place])    bladder  . Chronic low back pain   . Cocaine abuse in remission ([place])    Quit date in 1995  . ESRD (end stage renal disease) ([place])    Home Dialysis  . 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 Started HD in [place].  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  On NxStage home hemo since 2010. Using RUA AVF.    [name] Hearing aid worn    B/L  . Hepatitis C    s/p treatment with [place]  . History of blood transfusion 2007 X 1  . History of kidney stones   . Hx of constipation   . Hypertension   . Left inguinal hernia   . Peripheral vascular disease ([place])   . Pulmonary embolism [facility]) Feb 2012   Treated with coumadin x 1 year  . Wears glasses     Patient Active Problem List   Diagnosis Date Noted  . Severe  mitral regurgitation 12/22/2018  . Hypercholesterolemia 12/22/2018  . Volume overload 06/18/2018  . Fluid overload 06/17/2018  . Elevated troponin 06/17/2018  . Hematuria 03/27/2018  . Hypokalemia 03/27/2018  . Thrombocytopenia ([place]) 03/27/2018  . Anemia in ESRD (end-stage renal disease) ([place]) 03/27/2018  . Bladder tumor 03/27/2018  . 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 Hypertension with heart disease 10/14/2017  . Pyelocystitis 10/13/2017  . Pancytopenia ([place]) 06/14/2016  . Left arm pain 06/14/2016  . 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 Mechanical complication of other vascular device, implant, and graft 08/10/2013  . End stage renal disease ([place]) 08/10/2013  . ESRD (end stage renal disease) ([place]) 06/07/2013  . Nausea and vomiting 06/07/2013  . SOB (shortness of breath) 05/10/2013  . Chest discomfort 11/27/2012  . Shortness of breath 11/27/2012  . Chronic hepatitis C without hepatic coma ([place]) 02/04/2012  . Secondary hyperparathyroidism (of renal origin) 02/04/2012  . Anemia associated with chronic renal failure 02/04/2012  . CAD (coronary atherosclerotic disease) 02/04/2012  . Pulmonary embolism ([place]) 07/20/2010  . Atherosclerosis of native coronary artery of native heart without angina pectoris 02/23/2009  . HEMORRHOIDS, INTERNAL 02/23/2009  . GERD 02/23/2009  . ESRD on  dialysis [facility]) 02/16/2009    Past Surgical History:  Procedure Laterality Date  . A/V FISTULAGRAM N/A 11/22/2017   Procedure: A/V Fistulagram;  Surgeon: Serafina Mitchell, MD;  Location: [facility] CV LAB;  Service: Cardiovascular;  Laterality: N/A;  rt. arm   . ABDOMINAL HERNIA REPAIR  2000    x 2  . AV FISTULA PLACEMENT Right 2007   upper arm  . AV FISTULA REPAIR Right ~ 03/2016 & 04/2016  . BLADDER AUGMENTATION  1975  . COLONOSCOPY    . CORONARY ANGIOPLASTY WITH STENT PLACEMENT  1999  . CYSTOSCOPY W/ URETERAL STENT PLACEMENT N/A 10/13/2017   Procedure: CYSTOSCOPY WITH IRRIGATION OF BLADDER  PLACEMENT OF FOLEY;  Surgeon: Ceasar Mons, MD;  Location: [facility];  Service: Urology;  Laterality: N/A;  . CYSTOSCOPY WITH FULGERATION N/A 03/27/2018   Procedure: [place] AND MUCOUS EVACUATION, BLADDER BIOPSY;  Surgeon: Franchot Gallo, MD;  Location: [facility];  Service: Urology;  Laterality: N/A;  . ESOPHAGOGASTRODUODENOSCOPY    . EXTRACORPOREAL SHOCK WAVE LITHOTRIPSY    . FISTULA SUPERFICIALIZATION Right 06/30/4823   Procedure: PLICATION OF ANEURYSM OF ARTERIOVENOUS FISTULA;  Surgeon: Angelia Mould, MD;  Location: [facility];  Service: Vascular;  Laterality: Right;  . HERNIA REPAIR    . INGUINAL HERNIA REPAIR Right 1975  . INGUINAL HERNIA REPAIR Left 03/04/2019   Procedure: LEFT OPEN INGUINAL HERNIA REPAIR WITH MESH;  Surgeon: Kinsinger, Arta Bruce, MD;  Location: [facility];  Service: General;  Laterality: Left;  GENERAL AND LMA  . PERIPHERAL VASCULAR BALLOON ANGIOPLASTY  11/22/2017   Procedure: PERIPHERAL VASCULAR BALLOON ANGIOPLASTY;  Surgeon: Serafina Mitchell, MD;  Location: [facility] CV LAB;  Service: Cardiovascular;;  rt. arm fistula  . PERIPHERAL VASCULAR CATHETERIZATION N/A 06/22/2016   Procedure: Left Arm Venography;  Surgeon: Elam Dutch, MD;  Location: [facility];  Service: Cardiovascular;  Laterality: N/A;  . PERIPHERAL VASCULAR CATHETERIZATION N/A 06/22/2016   Procedure: A/V Fistulagram - Right Arm;  Surgeon: Elam Dutch, MD;  Location: [facility];  Service: Cardiovascular;  Laterality: N/A;  . PERIPHERAL VASCULAR CATHETERIZATION Right 06/22/2016   Procedure: Peripheral Vascular Balloon Angioplasty;  Surgeon: Elam Dutch, MD;  Location: [facility] CV LAB;  Service: Cardiovascular;  Laterality: Right;  arm fistula       Family History  Problem Relation Age of Onset  . Cancer Mother   . Diabetes Mother   . Hypertension Mother   . Deep vein thrombosis Daughter     Social History   Tobacco Use  . Smoking status:  Former Smoker    Packs/day: 1.00    Years: 5.00    Pack years: 5.00    Types: Cigarettes    Quit date: 05/28/1993    Years since quitting: 26.8  . Smokeless tobacco: Never Used  Vaping Use  . Vaping Use: Never used  Substance Use Topics  . Alcohol use: Not Currently    Comment:  "nothing since 1995"  . Drug use: Not Currently    Types: Marijuana, Cocaine    Comment: denies 03/02/19    Home Medications Prior to Admission medications   Medication Sig Start Date End Date Taking? Authorizing Provider  aspirin EC 81 MG tablet Take 81 mg by mouth at bedtime.     [provider]  atorvastatin (LIPITOR) 10 MG tablet Take 10 mg by mouth daily. 08/07/18   [provider]  calcitRIOL (ROCALTROL) 0.5 MCG capsule Take 1.5 mcg  by mouth daily. 10/25/17   [provider]  calcium acetate (PHOSLO) 667 MG capsule Take 1,334 mg by mouth 3 (three) times daily with meals.     [provider]  ibuprofen (ADVIL) 800 MG tablet Take 1 tablet (800 mg total) by mouth every 8 (eight) hours as needed. Patient not taking: Reported on 11/03/2019 03/04/19   Kinsinger, Arta Bruce, MD  isosorbide mononitrate (IMDUR) 30 MG 24 hr tablet Take 30 mg by mouth daily.  08/15/18 12/01/19  [provider]  lidocaine-prilocaine (EMLA) cream Apply 1 application topically See admin instructions. Apply small amount to dialysis port (AVF) one hour before dialysis. Cover with occlusive dressing (saran wrap) 11/14/17   [provider]  Multiple Vitamin (MULTIVITAMIN WITH MINERALS) TABS tablet Take 1 tablet by mouth daily.    [provider]  oxyCODONE (OXY IR/ROXICODONE) 5 MG immediate release tablet Take 1 tablet (5 mg total) by mouth every 6 (six) hours as needed for severe pain. 03/04/19   Kinsinger, Arta Bruce, MD  oxycodone (ROXICODONE) 30 MG immediate release tablet Take 30 mg by mouth every 8 (eight) hours as needed for pain.  08/26/18   [provider]  oxyCODONE  (ROXICODONE) 5 MG immediate release tablet Take 1 tablet (5 mg total) by mouth every 4 (four) hours as needed. 12/01/19   Angelia Mould, MD  Oxycodone HCl 10 MG TABS Take 10 mg by mouth in the morning and at bedtime.  05/22/16   [provider]    Allergies    Heparin, Pork-derived products, and Sulfa antibiotics  Review of Systems   Review of Systems  Constitutional: Negative for chills and fever.  HENT: Negative for ear pain and sore throat.   Eyes: Negative for pain and visual disturbance.  Respiratory: Negative for cough and shortness of breath.   Cardiovascular: Negative for chest pain and palpitations.  Gastrointestinal: Negative for abdominal pain and vomiting.  Genitourinary: Negative for dysuria and hematuria.  Musculoskeletal: Negative for arthralgias and back pain.  Skin: Positive for wound. Negative for color change and rash.  Neurological: Negative for seizures and syncope.  All other systems reviewed and are negative.   Physical Exam Updated Vital Signs BP (!) 171/77   Pulse 82   Temp 98.8 F (37.1 C) (Oral)   Resp 12   Ht 5\' 4"  (1.626 m)   Wt 49.1 kg   SpO2 100%   BMI 18.58 kg/m   Physical Exam Vitals and nursing note reviewed.  Constitutional:      Appearance: He is well-developed.  HENT:     Head: Normocephalic and atraumatic.  Eyes:     Conjunctiva/sclera: Conjunctivae normal.  Cardiovascular:     Rate and Rhythm: Normal rate.     Pulses: Normal pulses.  Pulmonary:     Effort: Pulmonary effort is normal. No respiratory distress.  Abdominal:     Palpations: Abdomen is soft.     Tenderness: There is no abdominal tenderness.  Musculoskeletal:     Cervical back: Neck supple.     Comments: RUE: right proximal arm fistula noted healing wound, no overlying erythema, no palpable induration, good thrill  Skin:    General: Skin is warm and dry.  Neurological:     Mental Status: He is alert.    Media Information   Document  Information  Photos  Right arm   03/10/2020 09:37  Attached To:  Hospital Encounter on 03/10/20  Source Information  [place] Lasseter, Ellwood Dense, MD  [facility]     ED Results / Procedures / Treatments   Labs (all labs ordered are listed, but only abnormal results are displayed) Labs Reviewed  COMPREHENSIVE METABOLIC PANEL - Abnormal; Notable for the following components:      Result Value   Chloride 92 (*)    BUN 57 (*)    Creatinine, Ser 7.46 (*)    GFR, Estimated 7 (*)    All other components within normal limits  CBC WITH DIFFERENTIAL/PLATELET - Abnormal; Notable for the following components:   WBC 3.6 (*)    RBC 2.75 (*)    Hemoglobin 8.4 (*)    HCT 27.3 (*)    RDW 16.3 (*)    Platelets 82 (*)    Neutro Abs 1.2 (*)    All other components within normal limits  CULTURE, BLOOD (ROUTINE X 2)  CULTURE, BLOOD (ROUTINE X 2)  RESPIRATORY PANEL BY RT PCR (FLU A&B, COVID)  LACTIC ACID, PLASMA  PROTIME-INR  LACTIC ACID, PLASMA    EKG EKG Interpretation  Date/Time:  Thursday March 10 2020 01:10:14 EDT Ventricular Rate:  93 PR Interval:  182 QRS Duration: 78 QT Interval:  380 QTC Calculation: 472 R Axis:   -41 Text Interpretation: Normal sinus rhythm Left axis deviation Septal infarct , age undetermined T wave abnormality, consider lateral ischemia Abnormal ECG When compared with ECG of 12/01/2019, Nonspecific T wave abnormality is now present Confirmed by Delora Fuel ([id number]) on 03/10/2020 2:14:22 AM   Radiology DG Chest 2 View  Result Date: 03/10/2020 CLINICAL DATA:  Right upper extremity swelling.  Possible sepsis. EXAM: CHEST - 2 VIEW COMPARISON:  06/17/2018 FINDINGS: Mild cardiomegaly. Both lungs are clear. The visualized skeletal structures are unremarkable. IMPRESSION: Mild cardiomegaly without acute airspace disease. Electronically Signed   By: Ulyses Jarred M.D.   On: 03/10/2020 01:55    Procedures Procedures (including critical care time)  Medications  Ordered in ED Medications - No data to display  ED Course  I have reviewed the triage vital signs and the nursing notes.  Pertinent labs & imaging results that were available during my care of the patient were reviewed by me and considered in my medical decision making (see chart for details).    MDM Rules/Calculators/A&P                          65 year old male home hemodialysis presents to ER with concern for fistula site infection.  On exam noted small nonhealing wound over fistula site, reportedly with purulence yesterday at dialysis clinic.  Vascular surgery came to bedside, they are also concerned for possible infection.  Would like vascular ultrasound before making final plan.  Anticipate likely temporary catheter placement.  Nephrology consulted, they will come assess patient.  Will hold on antibiotics until vascular/nephrology have more definitive plan for now as patient is not toxic or ill-appearing, afebrile.  To further coordinate care of his vascular access, will admit to the hospitalist service.  Patient's labs stable, likely stable to defer dialysis until tomorrow when alternative access has been established.  Consult to [facility] for admit.   Final Clinical Impression(s) / ED Diagnoses Final diagnoses:  Complication of vascular access for dialysis, initial encounter    Rx / DC Orders ED Discharge Orders    None       Lucrezia Starch, MD 03/10/20 1221

## 2020-03-10 NOTE — H&P (Signed)
History and Physical    [name]:[id number] DOB: 02-06-1956 DOA: 03/10/2020  PCP: [name], NP  Patient coming from: home I have personally briefly reviewed patient's old medical records in [place]  Chief Complaint: Infected AV fistula site (right upper arm)  HPI: Logan Martin is a 64 y.o. male with medical history significant of ESRD on home dialysis (4 times per week-MTTF)), coronary artery disease, chronic thrombocytopenia, anemia of chronic disease, hyperlipidemia, chronic back pain, hepatitis C, PVD, bladder cancer presents to emergency department for evaluation of right upper arm-AV fistula site infection.  Patient reports swelling at AV fistula site since 1 week, started bleeding and some pus coming out since 2 days, pain with bending his arm and low-grade fever of 100.6 since 2 days.  He tells me that he noticed a small scab on AV fistula site about a month ago which has been gradually getting worse.  Reports association with generalized weakness, fatigue however denies nausea, vomiting, headache, blurry vision, chest pain, shortness of breath, extending, orthopnea, PND, abdominal pain, bowel changes.  He went to dialysis center yesterday and had wound culture taken and was advised to go to ER for further evaluation and management.  His last dialysis was Tuesday.  Has been compliant with home medications.  Denies smoking, alcohol, illicit drug use.  He lives with his grandson at home.  ED Course: Upon arrival to ED: Patient blood pressure was elevated, afebrile with WBC of 3.6, CBC shows anemia of chronic disease basic baseline, platelet: 82, lactic acid: WNL, blood culture and COVID-19 pending.  Potassium: WNL.  Chest x-ray shows mild cardiomegaly without acute airspace disease.  Patient was given IV vancomycin in ED.  EDP consulted vascular surgery and nephrology.  [facility] hospitalist consulted for admission.  Review of Systems: As per HPI otherwise negative.     Past Medical History:  Diagnosis Date  . Alcohol abuse    quit date in 1995  . Anemia   . Cancer ([place])    bladder  . Chronic low back pain   . Cocaine abuse in remission ([place])    Quit date in 1995  . ESRD (end stage renal disease) ([place])    Home Dialysis  . 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 Started HD in [place], [place].  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  On NxStage home hemo since 2010. Using RUA AVF.    [name] Hearing aid worn    B/L  . Hepatitis C    s/p treatment with Mavyret  . History of blood transfusion 2007 X 1  . History of kidney stones   . Hx of constipation   . Hypertension   . Left inguinal hernia   . Peripheral vascular disease ([place])   . Pulmonary embolism [facility]) Feb 2012   Treated with coumadin x 1 year  . Wears glasses     Past Surgical History:  Procedure Laterality Date  . A/V FISTULAGRAM N/A 11/22/2017   Procedure: A/V Fistulagram;  Surgeon: Serafina Mitchell, MD;  Location: [place] [facility];  Service: Cardiovascular;  Laterality: N/A;  rt. arm   . ABDOMINAL HERNIA REPAIR  2000    x 2  . AV FISTULA PLACEMENT Right 2007   upper arm  . AV FISTULA REPAIR Right ~ 03/2016 & 04/2016  . BLADDER AUGMENTATION  1975  . COLONOSCOPY    . CORONARY ANGIOPLASTY WITH STENT PLACEMENT  1999  . CYSTOSCOPY  W/ URETERAL STENT PLACEMENT N/A 10/13/2017   Procedure: CYSTOSCOPY WITH IRRIGATION OF BLADDER PLACEMENT OF FOLEY;  Surgeon: Ceasar Mons, MD;  Location: [facility];  Service: Urology;  Laterality: N/A;  . CYSTOSCOPY WITH FULGERATION N/A 03/27/2018   Procedure: [place] AND MUCOUS EVACUATION, BLADDER BIOPSY;  Surgeon: Franchot Gallo, MD;  Location: [facility];  Service: Urology;  Laterality: N/A;  . ESOPHAGOGASTRODUODENOSCOPY    . EXTRACORPOREAL SHOCK WAVE LITHOTRIPSY    . FISTULA SUPERFICIALIZATION Right 9/0/3009   Procedure: PLICATION OF ANEURYSM OF ARTERIOVENOUS FISTULA;  Surgeon:  Angelia Mould, MD;  Location: [facility];  Service: Vascular;  Laterality: Right;  . HERNIA REPAIR    . INGUINAL HERNIA REPAIR Right 1975  . INGUINAL HERNIA REPAIR Left 03/04/2019   Procedure: LEFT OPEN INGUINAL HERNIA REPAIR WITH MESH;  Surgeon: Kinsinger, Arta Bruce, MD;  Location: [place] [facility];  Service: General;  Laterality: Left;  GENERAL AND LMA  . PERIPHERAL VASCULAR BALLOON ANGIOPLASTY  11/22/2017   Procedure: PERIPHERAL VASCULAR BALLOON ANGIOPLASTY;  Surgeon: Serafina Mitchell, MD;  Location: [facility];  Service: Cardiovascular;;  rt. arm fistula  . PERIPHERAL VASCULAR CATHETERIZATION N/A 06/22/2016   Procedure: Left Arm Venography;  Surgeon: Elam Dutch, MD;  Location: [facility];  Service: Cardiovascular;  Laterality: N/A;  . PERIPHERAL VASCULAR CATHETERIZATION N/A 06/22/2016   Procedure: A/V Fistulagram - Right Arm;  Surgeon: Elam Dutch, MD;  Location: [facility];  Service: Cardiovascular;  Laterality: N/A;  . PERIPHERAL VASCULAR CATHETERIZATION Right 06/22/2016   Procedure: Peripheral Vascular Balloon Angioplasty;  Surgeon: Elam Dutch, MD;  Location: [facility];  Service: Cardiovascular;  Laterality: Right;  arm fistula     reports that he quit smoking about 26 years ago. His smoking use included cigarettes. He has a 5.00 pack-year smoking history. He has never used smokeless tobacco. He reports previous alcohol use. He reports previous drug use. Drugs: Marijuana and Cocaine.  Allergies  Allergen Reactions  . Heparin Rash    Pork products   . Pork-Derived Products Rash  . Sulfa Antibiotics Rash    Family History  Problem Relation Age of Onset  . Cancer Mother   . Diabetes Mother   . Hypertension Mother   . Deep vein thrombosis Daughter     Prior to Admission medications   Medication Sig Start Date End Date Taking? Authorizing Provider  aspirin EC 81 MG tablet Take 81 mg by mouth at bedtime.    Yes [provider]    atorvastatin (LIPITOR) 40 MG tablet Take 40 mg by mouth daily. 01/20/20  Yes [provider]  AURYXIA 1 GM 210 MG(Fe) tablet Take 420 mg by mouth 3 (three) times daily. 02/12/20  Yes [provider]  calcitRIOL (ROCALTROL) 0.5 MCG capsule Take 1.5 mcg by mouth daily. 10/25/17  Yes [provider]  calcium acetate (PHOSLO) 667 MG capsule Take 1,334 mg by mouth 3 (three) times daily with meals.    Yes [provider]  isosorbide mononitrate (IMDUR) 30 MG 24 hr tablet Take 30 mg by mouth daily.  08/15/18 03/10/20 Yes [provider]  lidocaine-prilocaine (EMLA) cream Apply 1 application topically See admin instructions. Apply small amount to dialysis port (AVF) one hour before dialysis. Cover with occlusive dressing (saran wrap) 11/14/17  Yes [provider]  oxycodone (ROXICODONE) 30 MG immediate release tablet Take 30 mg by mouth 3 (three) times daily.  08/26/18  Yes [provider]  Oxycodone HCl 10 MG TABS Take 10 mg by mouth daily as needed (breakthrough pain).  05/22/16  Yes [provider]  ibuprofen (ADVIL) 800 MG tablet Take 1 tablet (800 mg total) by mouth every 8 (eight) hours as needed. Patient not taking: Reported on 11/03/2019 03/04/19   Kinsinger, Arta Bruce, MD  metoprolol succinate (TOPROL-XL) 50 MG 24 hr tablet Take 50 mg by mouth daily. 02/16/20   [provider]  oxyCODONE (OXY IR/ROXICODONE) 5 MG immediate release tablet Take 1 tablet (5 mg total) by mouth every 6 (six) hours as needed for severe pain. Patient not taking: Reported on 03/10/2020 03/04/19   Kinsinger, Arta Bruce, MD  oxyCODONE (ROXICODONE) 5 MG immediate release tablet Take 1 tablet (5 mg total) by mouth every 4 (four) hours as needed. Patient not taking: Reported on 03/10/2020 12/01/19   Angelia Mould, MD  VELPHORO 500 MG chewable tablet Chew 1,000 mg by mouth 3 (three) times daily. 02/05/20   [provider]    Physical  Exam: Vitals:   03/10/20 1145 03/10/20 1150 03/10/20 1215 03/10/20 1245  BP: (!) 171/77  (!) 170/67 (!) 175/71  Pulse: 82 82 83 82  Resp:      Temp:      TempSrc:      SpO2: 100% 100% 100% 100%  Weight:      Height:        Constitutional: NAD, calm, comfortable, on room air, communicating well Eyes: PERRL, lids and conjunctivae normal ENMT: Mucous membranes are moist. Posterior pharynx clear of any exudate or lesions.Normal dentition.  Neck: normal, supple, no masses, no thyromegaly Respiratory: clear to auscultation bilaterally, no wheezing, no crackles. Normal respiratory effort. No accessory muscle use.  Cardiovascular: Regular rate and rhythm, no murmurs / rubs / gallops. No extremity edema. 2+ pedal pulses. No carotid bruits.  Abdomen: no tenderness, no masses palpated. No hepatosplenomegaly. Bowel sounds positive.  Musculoskeletal:    Neurologic: CN 2-12 grossly intact. Sensation intact, DTR normal. Strength 5/5 in all 4.  Psychiatric: Normal judgment and insight. Alert and oriented x 3. Normal mood.    Labs on Admission: I have personally reviewed following labs and imaging studies  CBC: Recent Labs  Lab 03/10/20 0144  WBC 3.6*  NEUTROABS 1.2*  HGB 8.4*  HCT 27.3*  MCV 99.3  PLT 82*   Basic Metabolic Panel: Recent Labs  Lab 03/10/20 0144  NA 138  K 4.4  CL 92*  CO2 32  GLUCOSE 90  BUN 57*  CREATININE 7.46*  CALCIUM 10.3   GFR: Estimated Creatinine Clearance: 6.9 mL/min (A) (by C-G formula based on SCr of 7.46 mg/dL (H)). Liver Function Tests: Recent Labs  Lab 03/10/20 0144  AST 18  ALT 11  ALKPHOS 73  BILITOT 0.7  PROT 7.1  ALBUMIN 3.6   No results for input(s): LIPASE, AMYLASE in the last 168 hours. No results for input(s): AMMONIA in the last 168 hours. Coagulation Profile: Recent Labs  Lab 03/10/20 0144  INR 1.1   Cardiac Enzymes: No results for input(s): CKTOTAL, CKMB, CKMBINDEX, TROPONINI in the last 168 hours. BNP (last 3  results) No results for input(s): PROBNP in the last 8760 hours. HbA1C: No results for input(s): HGBA1C in the last 72 hours. CBG: No results for input(s): GLUCAP in the last 168 hours. Lipid Profile: No results for input(s): CHOL, HDL, LDLCALC, TRIG, CHOLHDL, LDLDIRECT in the last 72 hours. Thyroid Function Tests: No results for input(s): TSH, T4TOTAL, FREET4, T3FREE, THYROIDAB in  the last 72 hours. Anemia Panel: No results for input(s): VITAMINB12, FOLATE, FERRITIN, TIBC, IRON, RETICCTPCT in the last 72 hours. Urine analysis:    Component Value Date/Time   COLORURINE STRAW (A) 03/28/2018 0300   APPEARANCEUR CLEAR 03/28/2018 0300   LABSPEC 1.000 (L) 03/28/2018 0300   PHURINE 6.0 03/28/2018 0300   GLUCOSEU NEGATIVE 03/28/2018 0300   HGBUR NEGATIVE 03/28/2018 0300   BILIRUBINUR NEGATIVE 03/28/2018 0300   KETONESUR NEGATIVE 03/28/2018 0300   PROTEINUR NEGATIVE 03/28/2018 0300   NITRITE NEGATIVE 03/28/2018 0300   LEUKOCYTESUR NEGATIVE 03/28/2018 0300    Radiological Exams on Admission: DG Chest 2 View  Result Date: 03/10/2020 CLINICAL DATA:  Right upper extremity swelling.  Possible sepsis. EXAM: CHEST - 2 VIEW COMPARISON:  06/17/2018 FINDINGS: Mild cardiomegaly. Both lungs are clear. The visualized skeletal structures are unremarkable. IMPRESSION: Mild cardiomegaly without acute airspace disease. Electronically Signed   By: Ulyses Jarred M.D.   On: 03/10/2020 01:55   VAS US DUPLEX DIALYSIS ACCESS (AVF, AVG)  Result Date: 03/10/2020 DIALYSIS ACCESS Reason for Exam: Pain, swelling, concern for infection. Access Site: Right Upper Extremity. Comparison Study: 08/10/13 Performing Technologist: Sharion Dove RVS  Examination Guidelines: A complete evaluation includes B-mode imaging, spectral Doppler, color Doppler, and power Doppler as needed of all accessible portions of each vessel. Unilateral testing is considered an integral part of a complete examination. Limited examinations for  reoccurring indications may be performed as noted.  Findings:    Summary: Well circumscribed heterogenous area adjacent to the graft. No perigraft fluid noted.  *See table(s) above for measurements and observations.   --------------------------------------------------------------------------------   Preliminary     EKG: Independently reviewed.  Normal sinus rhythm, left axis deviation.  No acute ST-T wave changes noted.  Assessment/Plan Active Problems:   ESRD on dialysis ([place])   Chronic hepatitis C without hepatic coma (H[facility])   Anemia associated with chronic renal failure   CAD (coronary atherosclerotic disease)   Thrombocytopenia ([facility])   Cellulitis   Chronic low back pain   Peripheral vascular disease ([facility])    Cellulitis/infected AV fistula site: -Patient presented with swelling, pain, low-grade fever, bleeding/discharge from AV fistula site. -Currently he is afebrile, WBC of 3.6, lactic acid: WNL, COVID-19 and blood culture is obtained and is pending. -Patient was given IV vancomycin in ED. -EDP consulted vascular surgery and nephrology-appreciate input.  Patient likely need revision of fistula with temporary dialysis catheter placement. -Duplex ultrasound of right upper extremity is ordered and is pending. -Tylenol/oxycodone/Dilaudid as needed for mild/moderate/severe pain respectively. -Continue IV vancomycin.  ESRD on hemodialysis: -Last hemodialysis on Tuesday.  Patient appears euvolemic on exam.  Chest x-ray negative. -Nephrology consulted.  Potassium is WNL.  Hypertension: Uncontrolled. -Continue home meds metoprolol, Imdur -Hydralazine as needed for blood pressure more than 160/100.  Monitor blood pressure closely.  Anemia of chronic disease: In the setting of ESRD.  H&H is currently stable.  Continue to monitor.  Transfuse as needed.  Thrombocytopenia: Platelet: 82.  Chronic -Monitor closely.  Chronic lower back pain: On oxycodone at home.  Hold for now. -Pain meds  as above.  Coronary artery disease/PVD: Stable -Continue aspirin, statin.  DVT prophylaxis: SCD, no chemical anticoagulation due to thrombocytopenia Code Status: Full code Family Communication: None present at bedside.  Plan of care discussed with patient in length and he verbalized understanding and agreed with it. Disposition Plan: Home in 2 to 3 days Consults called: Nephrology and vascular surgery Admission status: Inpatient   Mckinley Jewel MD  [facility]  If 7PM-7AM, please contact night-coverage [email or web address] Password [email or web address]  03/10/2020, 2:23 PM

## 2020-03-10 NOTE — ED Triage Notes (Signed)
The pt has  Had a swollen rt upper arm for 2 days. He has an infection in his rt upper arm with infection  The area was cultured yesterday

## 2020-03-11 ENCOUNTER — Inpatient Hospital Stay (HOSPITAL_COMMUNITY): Payer: Medicare Other

## 2020-03-11 DIAGNOSIS — T829XXA Unspecified complication of cardiac and vascular prosthetic device, implant and graft, initial encounter: Secondary | ICD-10-CM

## 2020-03-11 HISTORY — PX: IR US GUIDE VASC ACCESS LEFT: IMG2389

## 2020-03-11 HISTORY — PX: IR FLUORO GUIDE CV LINE LEFT: IMG2282

## 2020-03-11 LAB — CBC
HCT: 25.6 % — ABNORMAL LOW (ref 39.0–52.0)
HCT: 29.1 % — ABNORMAL LOW (ref 39.0–52.0)
Hemoglobin: 8.1 g/dL — ABNORMAL LOW (ref 13.0–17.0)
Hemoglobin: 9.1 g/dL — ABNORMAL LOW (ref 13.0–17.0)
MCH: 30.1 pg (ref 26.0–34.0)
MCH: 30 pg (ref 26.0–34.0)
MCHC: 31.6 g/dL (ref 30.0–36.0)
MCHC: 31.3 g/dL (ref 30.0–36.0)
MCV: 95.2 fL (ref 80.0–100.0)
MCV: 96 fL (ref 80.0–100.0)
Platelets: 86 K/uL — ABNORMAL LOW (ref 150–400)
Platelets: 132 10*3/uL — ABNORMAL LOW (ref 150–400)
RBC: 2.69 MIL/uL — ABNORMAL LOW (ref 4.22–5.81)
RBC: 3.03 MIL/uL — ABNORMAL LOW (ref 4.22–5.81)
RDW: 16.4 % — ABNORMAL HIGH (ref 11.5–15.5)
RDW: 16.6 % — ABNORMAL HIGH (ref 11.5–15.5)
WBC: 4.3 K/uL (ref 4.0–10.5)
WBC: 5 10*3/uL (ref 4.0–10.5)
nRBC: 0 % (ref 0.0–0.2)
nRBC: 0 % (ref 0.0–0.2)

## 2020-03-11 LAB — COMPREHENSIVE METABOLIC PANEL
ALT: 12 U/L (ref 0–44)
AST: 15 U/L (ref 15–41)
Albumin: 3.3 g/dL — ABNORMAL LOW (ref 3.5–5.0)
Alkaline Phosphatase: 62 U/L (ref 38–126)
Anion gap: 14 (ref 5–15)
BUN: 72 mg/dL — ABNORMAL HIGH (ref 8–23)
CO2: 28 mmol/L (ref 22–32)
Calcium: 10.2 mg/dL (ref 8.9–10.3)
Chloride: 93 mmol/L — ABNORMAL LOW (ref 98–111)
Creatinine, Ser: 9.66 mg/dL — ABNORMAL HIGH (ref 0.61–1.24)
GFR, Estimated: 5 mL/min — ABNORMAL LOW (ref >60)
Glucose, Bld: 94 mg/dL (ref 70–99)
Potassium: 4.7 mmol/L (ref 3.5–5.1)
Sodium: 135 mmol/L (ref 135–145)
Total Bilirubin: 0.7 mg/dL (ref 0.3–1.2)
Total Protein: 6.4 g/dL — ABNORMAL LOW (ref 6.5–8.1)

## 2020-03-11 LAB — HEPATITIS B SURFACE ANTIBODY,QUALITATIVE: Hep B S Ab: REACTIVE — AB

## 2020-03-11 LAB — HEPATITIS B CORE ANTIBODY, TOTAL: Hep B Core Total Ab: NONREACTIVE

## 2020-03-11 LAB — HEPATITIS B SURFACE ANTIGEN: Hepatitis B Surface Ag: NONREACTIVE

## 2020-03-11 MED ORDER — IOHEXOL 300 MG/ML  SOLN
50.0000 mL | Freq: Once | INTRAMUSCULAR | Status: AC | PRN
  Administered 2020-03-11: 5 mL via INTRAVENOUS

## 2020-03-11 MED ORDER — HEPARIN SODIUM (PORCINE) 1000 UNIT/ML IJ SOLN
INTRAMUSCULAR | Status: AC
  Filled 2020-03-11: qty 1

## 2020-03-11 MED ORDER — PROMETHAZINE HCL 25 MG/ML IJ SOLN
12.5000 mg | Freq: Four times a day (QID) | INTRAMUSCULAR | Status: DC | PRN
  Administered 2020-03-11 – 2020-03-16 (×3): 12.5 mg via INTRAVENOUS
  Filled 2020-03-11 (×3): qty 1

## 2020-03-11 MED ORDER — LIDOCAINE HCL 1 % IJ SOLN
INTRAMUSCULAR | Status: DC | PRN
  Administered 2020-03-11: 10 mL

## 2020-03-11 MED ORDER — CHLORHEXIDINE GLUCONATE CLOTH 2 % EX PADS
6.0000 | MEDICATED_PAD | Freq: Every day | CUTANEOUS | Status: DC
  Administered 2020-03-12 – 2020-03-17 (×5): 6 via TOPICAL

## 2020-03-11 MED ORDER — ANTICOAGULANT SODIUM CITRATE 4% (200MG/5ML) IV SOLN
10.0000 mL | Freq: Once | Status: AC
  Administered 2020-03-11: 10 mL
  Filled 2020-03-11 (×4): qty 10

## 2020-03-11 MED ORDER — VANCOMYCIN HCL IN DEXTROSE 500-5 MG/100ML-% IV SOLN
500.0000 mg | INTRAVENOUS | Status: AC
  Administered 2020-03-11: 500 mg via INTRAVENOUS
  Filled 2020-03-11 (×3): qty 100

## 2020-03-11 MED ORDER — LIDOCAINE HCL 1 % IJ SOLN
INTRAMUSCULAR | Status: AC
  Filled 2020-03-11: qty 20

## 2020-03-11 NOTE — Progress Notes (Signed)
Round on patient from referral of floor RN, Gwenlyn Perking, secondary to patient's request for a regular diet. Patient is very knowledgeable about fluid and dietary restrictions and reports he has been on HD for 25 years and is one of the first in [place], [place] to do home HD. This RN had a congruent conversation in regards to his needs as a patient and our needs as clinicians to provide care. Patient is agreeable to maintain a renal diet at this time but does request a pack of ketchup in order to consume a hamburger. Patient made aware that this is a one time pack of ketchup in accordance with him agreeing to maintain renal diet standards. Appreciative of refferral.   Dorthey Sawyer, RN  Dialysis Nurse Coordinator

## 2020-03-11 NOTE — Progress Notes (Signed)
   Patient does have fluid around the fistula on duplex however this does not appear definitively as infection could also be hematoma.  He does not have any leukocytosis.  For now would rest his right arm with catheter when his arm looks better we could consider open revision.  Will need tunneled dialysis catheter prior to discharge.  Morissa Obeirne C. Donzetta Matters, MD [facility] Office: [phone] Pager: [phone]

## 2020-03-11 NOTE — Progress Notes (Signed)
PROGRESS NOTE    AKIN YI  LOV:[id number] DOB: 10-06-1955 DOA: 03/10/2020 PCP: [name], NP  Brief Narrative:Logan Martin is a 64 y.o. male with medical history significant of ESRD on home hemodialysis (4 times per week-MTTF)), coronary artery disease, chronic thrombocytopenia, anemia of chronic disease, hyperlipidemia, chronic back pain, hepatitis C, PVD, bladder cancer presented  to emergency department for evaluation of right upper arm-AV fistula site, which was more swollen and had drainage, raising concern for infection, cultures were not obtained. -Patient is afebrile in the ED, vascular surgery and nephrology consulting,  Assessment & Plan:   Right arm AV fistula swelling and drainage -Concern for infection, afebrile and nontoxic at this time -Started on IV vancomycin 10/14 -Blood cultures pending from yesterday -Nephrology and vascular surgery following, -IR consulted for temporary HD catheter  ESRD on home hemodialysis -Last HD 10/12 -Per renal  Anemia of chronic disease -Stable, continue EPO with HD  Hypertension -Continue metoprolol, Imdur  Chronic thrombocytopenia -Monitor  CAD/peripheral vascular disease -Continue aspirin, statin  Chronic lower back pain -On oxycodone at baseline  DVT prophylaxis: SCDs due to thrombocytopenia Code Status: Full code Family Communication: No family at bedside Disposition Plan:  Status is: Inpatient  Remains inpatient appropriate because:Inpatient level of care appropriate due to severity of illness   Dispo: The patient is from: Home              Anticipated d/c is to: Home              Anticipated d/c date is: > 3 days              Patient currently is not medically stable to d/c.  Consultants:   VVS, Renal, IR   Procedures:   Antimicrobials:    Subjective: -Mild discomfort at the AV fistula site, otherwise stable  Objective: Vitals:   03/10/20 1700 03/10/20 2043 03/11/20 0445 03/11/20  0837  BP: (!) 163/81 (!) 169/66 (!) 164/46 (!) 182/76  Pulse: 94 83 83 82  Resp: 16 18 18 18   Temp: 98.7 F (37.1 C) 98.7 F (37.1 C) 98.4 F (36.9 C) (!) 97.5 F (36.4 C)  TempSrc: Oral Oral  Oral  SpO2: 100%   98%  Weight: 50.1 kg 50.1 kg    Height: 5\' 4"  (1.626 m)       Intake/Output Summary (Last 24 hours) at 03/11/2020 1029 Last data filed at 03/11/2020 0445 Gross per 24 hour  Intake 433.33 ml  Output 0 ml  Net 433.33 ml   Filed Weights   03/10/20 0116 03/10/20 1700 03/10/20 2043  Weight: 49.1 kg 50.1 kg 50.1 kg    Examination:  General exam:Pleasant male laying in bed, AAOx3, no distress p CVS: S1-S2, regular rate rhythm Lungs: Clear bilaterally Abdomen: Soft, nontender, bowel sounds present Extremities: No edema, right arm AV fistula with swelling, large scab, no discharge  Psychiatry:Mood & affect appropriate.     Data Reviewed:   CBC: Recent Labs  Lab 03/10/20 0144 03/11/20 0339  WBC 3.6* 4.3  NEUTROABS 1.2*  --   HGB 8.4* 8.1*  HCT 27.3* 25.6*  MCV 99.3 95.2  PLT 82* 86*   Basic Metabolic Panel: Recent Labs  Lab 03/10/20 0144 03/10/20 1741 03/11/20 0339  NA 138  --  135  K 4.4  --  4.7  CL 92*  --  93*  CO2 32  --  28  GLUCOSE 90  --  94  BUN 57*  --  72*  CREATININE 7.46*  --  9.66*  CALCIUM 10.3  --  10.2  MG 2.1 1.9  --    GFR: Estimated Creatinine Clearance: 5.5 mL/min (A) (by C-G formula based on SCr of 9.66 mg/dL (H)). Liver Function Tests: Recent Labs  Lab 03/10/20 0144 03/11/20 0339  AST 18 15  ALT 11 12  ALKPHOS 73 62  BILITOT 0.7 0.7  PROT 7.1 6.4*  ALBUMIN 3.6 3.3*   No results for input(s): LIPASE, AMYLASE in the last 168 hours. No results for input(s): AMMONIA in the last 168 hours. Coagulation Profile: Recent Labs  Lab 03/10/20 0144  INR 1.1   Cardiac Enzymes: No results for input(s): CKTOTAL, CKMB, CKMBINDEX, TROPONINI in the last 168 hours. BNP (last 3 results) No results for input(s): PROBNP in the  last 8760 hours. HbA1C: No results for input(s): HGBA1C in the last 72 hours. CBG: No results for input(s): GLUCAP in the last 168 hours. Lipid Profile: No results for input(s): CHOL, HDL, LDLCALC, TRIG, CHOLHDL, LDLDIRECT in the last 72 hours. Thyroid Function Tests: No results for input(s): TSH, T4TOTAL, FREET4, T3FREE, THYROIDAB in the last 72 hours. Anemia Panel: No results for input(s): VITAMINB12, FOLATE, FERRITIN, TIBC, IRON, RETICCTPCT in the last 72 hours. Urine analysis:    Component Value Date/Time   COLORURINE STRAW (A) 03/28/2018 0300   APPEARANCEUR CLEAR 03/28/2018 0300   LABSPEC 1.000 (L) 03/28/2018 0300   PHURINE 6.0 03/28/2018 0300   GLUCOSEU NEGATIVE 03/28/2018 0300   HGBUR NEGATIVE 03/28/2018 0300   BILIRUBINUR NEGATIVE 03/28/2018 0300   KETONESUR NEGATIVE 03/28/2018 0300   PROTEINUR NEGATIVE 03/28/2018 0300   NITRITE NEGATIVE 03/28/2018 0300   LEUKOCYTESUR NEGATIVE 03/28/2018 0300   Sepsis Labs: @LABRCNTIP (procalcitonin:4,lacticidven:4)  ) Recent Results (from the past 240 hour(s))  Culture, blood (Routine x 2)     Status: None (Preliminary result)   Collection Time: 03/10/20  1:30 AM   Specimen: BLOOD LEFT ARM  Result Value Ref Range Status   Specimen Description BLOOD LEFT ARM  Final   Special Requests   Final    BOTTLES DRAWN AEROBIC AND ANAEROBIC Blood Culture adequate volume   Culture   Final    NO GROWTH 1 DAY Performed at [facility], [place] [address], [place], [place] [address]    Report Status PENDING  Incomplete  Culture, blood (Routine x 2)     Status: None (Preliminary result)   Collection Time: 03/10/20  1:46 AM   Specimen: BLOOD LEFT HAND  Result Value Ref Range Status   Specimen Description BLOOD LEFT HAND  Final   Special Requests   Final    AEROBIC BOTTLE ONLY Blood Culture results may not be optimal due to an excessive volume of blood received in culture bottles   Culture   Final    NO GROWTH 1 DAY Performed at [facility], [place] [address] [place], [place] [address]    Report Status PENDING  Incomplete  Respiratory Panel by RT PCR (Flu A&B, Covid) - Nasopharyngeal Swab     Status: None   Collection Time: 03/10/20 11:41 AM   Specimen: Nasopharyngeal Swab  Result Value Ref Range Status   SARS Coronavirus 2 by RT PCR NEGATIVE NEGATIVE Final    Comment: (NOTE) SARS-CoV-2 target nucleic acids are NOT DETECTED.  The SARS-CoV-2 RNA is generally detectable in upper respiratoy specimens during the acute phase of infection. The lowest concentration of SARS-CoV-2 viral copies this assay can detect is 131 copies/mL. A negative  result does not preclude SARS-Cov-2 infection and should not be used as the sole basis for treatment or other patient management decisions. A negative result may occur with  improper specimen collection/handling, submission of specimen other than nasopharyngeal swab, presence of viral mutation(s) within the areas targeted by this assay, and inadequate number of viral copies (<131 copies/mL). A negative result must be combined with clinical observations, patient history, and epidemiological information. The expected result is Negative.  Fact Sheet for Patients:  PinkCheek.be  Fact Sheet for Healthcare Providers:  GravelBags.it  This test is no t yet approved or cleared by the [place] FDA and  has been authorized for detection and/or diagnosis of SARS-CoV-2 by FDA under an Emergency Use Authorization (EUA). This EUA will remain  in effect (meaning this test can be used) for the duration of the COVID-19 declaration under Section 564(b)(1) of the Act, 21 U.S.C. section 360bbb-3(b)(1), unless the authorization is terminated or revoked sooner.     Influenza A by PCR NEGATIVE NEGATIVE Final   Influenza B by PCR NEGATIVE NEGATIVE Final    Comment: (NOTE) The Xpert Xpress SARS-CoV-2/FLU/RSV assay is intended as an aid in    the diagnosis of influenza from Nasopharyngeal swab specimens and  should not be used as a sole basis for treatment. Nasal washings and  aspirates are unacceptable for Xpert Xpress SARS-CoV-2/FLU/RSV  testing.  Fact Sheet for Patients: PinkCheek.be  Fact Sheet for Healthcare Providers: GravelBags.it  This test is not yet approved or cleared by the [place] FDA and  has been authorized for detection and/or diagnosis of SARS-CoV-2 by  FDA under an Emergency Use Authorization (EUA). This EUA will remain  in effect (meaning this test can be used) for the duration of the  Covid-19 declaration under Section 564(b)(1) of the Act, 21  U.S.C. section 360bbb-3(b)(1), unless the authorization is  terminated or revoked. Performed at [facility], [place] [address], [place], [place] [address]          Radiology Studies: DG Chest 2 View  Result Date: 03/10/2020 CLINICAL DATA:  Right upper extremity swelling.  Possible sepsis. EXAM: CHEST - 2 VIEW COMPARISON:  06/17/2018 FINDINGS: Mild cardiomegaly. Both lungs are clear. The visualized skeletal structures are unremarkable. IMPRESSION: Mild cardiomegaly without acute airspace disease. Electronically Signed   By: Ulyses Jarred M.D.   On: 03/10/2020 01:55   VAS US DUPLEX DIALYSIS ACCESS (AVF, AVG)  Result Date: 03/10/2020 DIALYSIS ACCESS Reason for Exam: Pain, swelling, concern for infection. Access Site: Right Upper Extremity. Comparison Study: 08/10/13 Performing Technologist: Sharion Dove RVS  Examination Guidelines: A complete evaluation includes B-mode imaging, spectral Doppler, color Doppler, and power Doppler as needed of all accessible portions of each vessel. Unilateral testing is considered an integral part of a complete examination. Limited examinations for reoccurring indications may be performed as noted.  Findings:    Summary: Well circumscribed heterogenous area  adjacent to the graft. No perigraft fluid noted.  *See table(s) above for measurements and observations.  Diagnosing physician: Servando Snare MD Electronically signed by Servando Snare MD on 03/10/2020 at 5:38:45 PM.   --------------------------------------------------------------------------------   Final         Scheduled Meds:  aspirin EC  81 mg Oral QHS   atorvastatin  40 mg Oral Daily   ferric citrate  420 mg Oral TID with meals   isosorbide mononitrate  30 mg Oral Daily   metoprolol succinate  50 mg Oral Daily   vancomycin variable dose per  unstable renal function (pharmacist dosing)   Does not apply See admin instructions   Continuous Infusions:   LOS: 1 day    Time spent: 5min  Domenic Polite, MD [facility]  03/11/2020, 10:29 AM

## 2020-03-11 NOTE — Progress Notes (Signed)
Subjective:  Awoken from sleep, no complaints for OR today and probable temporary HD catheter and hemodialysis after  Objective Vital signs in last 24 hours: Vitals:   03/10/20 1700 03/10/20 2043 03/11/20 0445 03/11/20 0837  BP: (!) 163/81 (!) 169/66 (!) 164/46 (!) 182/76  Pulse: 94 83 83 82  Resp: 16 18 18 18   Temp: 98.7 F (37.1 C) 98.7 F (37.1 C) 98.4 F (36.9 C) (!) 97.5 F (36.4 C)  TempSrc: Oral Oral  Oral  SpO2: 100%   98%  Weight: 50.1 kg 50.1 kg    Height: 5\' 4"  (1.626 m)      Weight change: 1 kg  Physical Exam: General: Alert thin male, NAD Heart: RRR, no MGR Lungs: CTA, nonlabored breathing room air Abdomen: Bowel sounds positive normoactive, soft nontender nondistended, no ascites Extremities: No pedal edema  Dialysis Access: Right upper extremity AV fistula positive bruit with swelling and tenderness no discharge appreciated    OP Dialysis Orders:  Home HD NxStage  MTTS EDW 50 kg  OP Labs 10/7: Hgb 9.8, K 7.1!, Ca 10.7, P 5.3,  PTH 209   Problem/Plan: 1.  Infected RUE AVF - VVS consulted - duplex for OR today likely to require revision of fistula with TDC// Blood cx pending. IV antibiotics ordered. Wound Cx collected OP center 10/13 -pending//temporary catheter instead of PermCath now because of infection //placement pending OR// for dialysis .  2.  ESRD -  HomeHD. Last HD 10/12.  A.m.'s labs K4.7, BUN 72 CR 9.66 no urgent dialysis / dialysis once temp cath placed.  3. Hypertension/volume  - BP elevated. Continue home meds. UF with HD as tolerated.  Chest x-ray shows no volume overload 4.  Anemia  - Hgb 8.1< 8.4. Received Aranesp 200 at outpatient center 03/50.  5.  Metabolic bone disease -  Continue Auryixa binder. Hold calcitriol for now d/t elevated Ca 6.  Hematemesis  - coffee ground emesis with clots x 1 reported at outpatient center 10/13.  None reported since admission.  No heparin HD for now  Ernest Haber, PA-C [facility]  [phone] 03/11/2020,10:07 AM  LOS: 1 day   Labs: Basic Metabolic Panel: Recent Labs  Lab 03/10/20 0144 03/11/20 0339  NA 138 135  K 4.4 4.7  CL 92* 93*  CO2 32 28  GLUCOSE 90 94  BUN 57* 72*  CREATININE 7.46* 9.66*  CALCIUM 10.3 10.2   Liver Function Tests: Recent Labs  Lab 03/10/20 0144 03/11/20 0339  AST 18 15  ALT 11 12  ALKPHOS 73 62  BILITOT 0.7 0.7  PROT 7.1 6.4*  ALBUMIN 3.6 3.3*   No results for input(s): LIPASE, AMYLASE in the last 168 hours. No results for input(s): AMMONIA in the last 168 hours. CBC: Recent Labs  Lab 03/10/20 0144 03/11/20 0339  WBC 3.6* 4.3  NEUTROABS 1.2*  --   HGB 8.4* 8.1*  HCT 27.3* 25.6*  MCV 99.3 95.2  PLT 82* 86*   Cardiac Enzymes: No results for input(s): CKTOTAL, CKMB, CKMBINDEX, TROPONINI in the last 168 hours. CBG: No results for input(s): GLUCAP in the last 168 hours.  Studies/Results: DG Chest 2 View  Result Date: 03/10/2020 CLINICAL DATA:  Right upper extremity swelling.  Possible sepsis. EXAM: CHEST - 2 VIEW COMPARISON:  06/17/2018 FINDINGS: Mild cardiomegaly. Both lungs are clear. The visualized skeletal structures are unremarkable. IMPRESSION: Mild cardiomegaly without acute airspace disease. Electronically Signed   By: Ulyses Jarred M.D.   On: 03/10/2020 01:55  VAS US DUPLEX DIALYSIS ACCESS (AVF, AVG)  Result Date: 03/10/2020 DIALYSIS ACCESS Reason for Exam: Pain, swelling, concern for infection. Access Site: Right Upper Extremity. Comparison Study: 08/10/13 Performing Technologist: Sharion Dove RVS  Examination Guidelines: A complete evaluation includes B-mode imaging, spectral Doppler, color Doppler, and power Doppler as needed of all accessible portions of each vessel. Unilateral testing is considered an integral part of a complete examination. Limited examinations for reoccurring indications may be performed as noted.  Findings:    Summary: Well circumscribed heterogenous area adjacent to the graft. No  perigraft fluid noted.  *See table(s) above for measurements and observations.  Diagnosing physician: Servando Snare MD Electronically signed by Servando Snare MD on 03/10/2020 at 5:38:45 PM.   --------------------------------------------------------------------------------   Final    Medications:  . aspirin EC  81 mg Oral QHS  . atorvastatin  40 mg Oral Daily  . ferric citrate  420 mg Oral TID with meals  . isosorbide mononitrate  30 mg Oral Daily  . metoprolol succinate  50 mg Oral Daily  . vancomycin variable dose per unstable renal function (pharmacist dosing)   Does not apply See admin instructions

## 2020-03-11 NOTE — Consult Note (Signed)
Patient Status: [facility] - Out-pt  Assessment and Plan: Patient in need of dialysis access.  Patient with RUE fistula.  Swollen, tender, concern for infection.  IR consulted for temporary dialysis catheter placement.  Patient may ultimately require tunneled HD cath placement, however with ongoing infectious concerns and work-up, plan made to place temporary catheter at this time and re-eval long-term/discharge needs once known.   Risks and benefits discussed with the patient including, but not limited to bleeding, infection, vascular injury, pneumothorax which may require chest tube placement, air embolism or even death  All of the patient's questions were answered, patient is agreeable to proceed. Consent signed and in chart.  ______________________________________________________________________   History of Present Illness: Logan Martin is a 64 y.o. male with past medical history of ESRD on dialysis 4 days/week, CAD, hepatitis, bladder cancer who presents with concern for infection of his R arm AV fistula.   Allergies and medications reviewed.   Review of Systems: A 12 point ROS discussed and pertinent positives are indicated in the HPI above.  All other systems are negative.  Review of Systems  Constitutional: Positive for fever. Negative for fatigue.  Respiratory: Negative for cough and shortness of breath.   Cardiovascular: Negative for chest pain.  Gastrointestinal: Negative for abdominal pain, nausea and vomiting.  Musculoskeletal: Negative for back pain.  Psychiatric/Behavioral: Negative for behavioral problems and confusion.    Vital Signs: BP (!) 182/76 (BP Location: Left Arm)   Pulse 82   Temp (!) 97.5 F (36.4 C) (Oral)   Resp 18   Ht 5\' 4"  (1.626 m)   Wt 110 lb 7.2 oz (50.1 kg)   SpO2 98%   BMI 18.96 kg/m   Physical Exam Vitals and nursing note reviewed.  Constitutional:      Appearance: Normal appearance.  HENT:     Mouth/Throat:     Mouth: Mucous  membranes are moist.     Pharynx: Oropharynx is clear.  Cardiovascular:     Rate and Rhythm: Normal rate and regular rhythm.  Pulmonary:     Effort: Pulmonary effort is normal.     Breath sounds: Normal breath sounds.  Abdominal:     General: Abdomen is flat.     Palpations: Abdomen is soft.  Skin:    General: Skin is warm and dry.  Neurological:     General: No focal deficit present.     Mental Status: He is alert and oriented to person, place, and time. Mental status is at baseline.  Psychiatric:        Mood and Affect: Mood normal.        Behavior: Behavior normal.        Thought Content: Thought content normal.        Judgment: Judgment normal.      Imaging reviewed.   Labs:  COAGS: Recent Labs    03/10/20 0144  INR 1.1    BMP: Recent Labs    12/01/19 0826 03/10/20 0144 03/11/20 0339  NA 140 138 135  K 5.0 4.4 4.7  CL 99 92* 93*  CO2  --  32 28  GLUCOSE 76 90 94  BUN 32* 57* 72*  CALCIUM  --  10.3 10.2  CREATININE 8.20* 7.46* 9.66*  GFRNONAA  --  7* 5*       Electronically Signed: Docia Barrier, PA 03/11/2020, 10:45 AM   I spent a total of 15 minutes in face to face in clinical consultation, greater than 50% of  which was counseling/coordinating care for venous access.

## 2020-03-11 NOTE — Plan of Care (Signed)
  Problem: Education: Goal: Knowledge of General Education information will improve Description Including pain rating scale, medication(s)/side effects and non-pharmacologic comfort measures Outcome: Progressing   

## 2020-03-11 NOTE — Progress Notes (Signed)
Renal Navigator has spoken with Home Therapy program and [place]. If patient requires [facility] for HD access at discharge, Renal Navigator will need to arrange for temporary in-center HD. Navigator will follow along.  Alphonzo Cruise,  Renal Navigator [phone]

## 2020-03-11 NOTE — Procedures (Signed)
Interventional Radiology Procedure Note  Procedure: Temporary hemodialysis catheter placement  Findings: Please refer to procedural dictation for full description. Left IJ 24 cm non-tunneled triple lumen hemodialysis catheter  Complications: None immediate  Estimated Blood Loss: <5 mL  Recommendations: Catheter ready for immediate use.   Ruthann Cancer, MD Pager: [phone]

## 2020-03-12 ENCOUNTER — Encounter (HOSPITAL_COMMUNITY): Payer: Self-pay | Admitting: Internal Medicine

## 2020-03-12 LAB — BASIC METABOLIC PANEL
Anion gap: 12 (ref 5–15)
BUN: 33 mg/dL — ABNORMAL HIGH (ref 8–23)
CO2: 29 mmol/L (ref 22–32)
Calcium: 9.8 mg/dL (ref 8.9–10.3)
Chloride: 97 mmol/L — ABNORMAL LOW (ref 98–111)
Creatinine, Ser: 6.88 mg/dL — ABNORMAL HIGH (ref 0.61–1.24)
GFR, Estimated: 8 mL/min — ABNORMAL LOW (ref >60)
Glucose, Bld: 83 mg/dL (ref 70–99)
Potassium: 4.5 mmol/L (ref 3.5–5.1)
Sodium: 138 mmol/L (ref 135–145)

## 2020-03-12 LAB — CBC
HCT: 26.6 % — ABNORMAL LOW (ref 39.0–52.0)
Hemoglobin: 8.2 g/dL — ABNORMAL LOW (ref 13.0–17.0)
MCH: 29.7 pg (ref 26.0–34.0)
MCHC: 30.8 g/dL (ref 30.0–36.0)
MCV: 96.4 fL (ref 80.0–100.0)
Platelets: 107 10*3/uL — ABNORMAL LOW (ref 150–400)
RBC: 2.76 MIL/uL — ABNORMAL LOW (ref 4.22–5.81)
RDW: 16.5 % — ABNORMAL HIGH (ref 11.5–15.5)
WBC: 4.8 10*3/uL (ref 4.0–10.5)
nRBC: 0 % (ref 0.0–0.2)

## 2020-03-12 NOTE — Progress Notes (Signed)
Subjective: Seen in room, woken from sleep no complaints already dialysis using temporary catheter yesterday, noted yesterday Dr. Donzetta Matters noted" fistula duplex does not appear definitely to have infection also could be hematoma no leukocytosis rest arm with catheter, right arm looks better consider open revision but will need tunneled PermCath before discharge"  Objective Vital signs in last 24 hours: Vitals:   03/11/20 2019 03/12/20 0500 03/12/20 0510 03/12/20 0929  BP: (!) 154/88  (!) 171/82 (!) 171/65  Pulse: 96  84 81  Resp:   16 16  Temp: 97.7 F (36.5 C)  98.4 F (36.9 C) 98.1 F (36.7 C)  TempSrc: Oral  Oral Oral  SpO2: 97%  100% 93%  Weight: 42.8 kg 42.8 kg    Height:       Weight change: -1.5 kg   Physical Exam: General: Alert thin male, NAD Heart: RRR, no MGR Lungs: CTA, nonlabored breathing room air Abdomen: Bowel sounds positive normoactive, soft nontender nondistended, no ascites Extremities: No pedal edema  Dialysis Access: Right upper extremity AV fistula positive bruit with swelling and tenderness minimal, no discharge appreciated Left IJ temporary catheter in place    OP Dialysis Orders: Home HD NxStage MTTS EDW 50 kg  OP Labs 10/7: Hgb 9.8, K 7.1!, Ca 10.7, P 5.3, PTH 209   Problem/Plan: 1. Infected RUE AVF - VVS  Seeing with  Dr. Donzetta Matters and as noted above.  On IV vancomycin  Blood cx no growth x2 days.  Wound Cx collected OP center 10/13= heavy growth corynebacterium species. 2. ESRD - HomeHD.  In hospital plan MWF, a.m. labs K4.5, BUN 33, CR 6.88, HD yesterday via temporary left IJ 3. Hypertension/volume - am BP elevated. home meds.  Metoprolol XL 50 mg and Imdur 30, patient states has not gotten BP meds this a.m. that was elevated, UF 1.4 L yesterday on dialysis questionable weight with patient 7 kg below dry weight after dialysis Chest x-ray shows no volume overload 4. Anemia - Hgb <8.2<8.1< 8.4. Received Aranesp 200 at outpatient center  81/85. 5. Metabolic bone disease - Continue Auryixa binder. Hold calcitriol for now d/t elevated Ca  Ernest Haber, PA-C [facility] Beeper [phone] 03/12/2020,10:46 AM  LOS: 2 days   Labs: Basic Metabolic Panel: Recent Labs  Lab 03/10/20 0144 03/11/20 0339 03/12/20 0350  NA 138 135 138  K 4.4 4.7 4.5  CL 92* 93* 97*  CO2 32 28 29  GLUCOSE 90 94 83  BUN 57* 72* 33*  CREATININE 7.46* 9.66* 6.88*  CALCIUM 10.3 10.2 9.8   Liver Function Tests: Recent Labs  Lab 03/10/20 0144 03/11/20 0339  AST 18 15  ALT 11 12  ALKPHOS 73 62  BILITOT 0.7 0.7  PROT 7.1 6.4*  ALBUMIN 3.6 3.3*   No results for input(s): LIPASE, AMYLASE in the last 168 hours. No results for input(s): AMMONIA in the last 168 hours. CBC: Recent Labs  Lab 03/10/20 0144 03/10/20 0144 03/11/20 0339 03/11/20 1533 03/12/20 0350  WBC 3.6*   < > 4.3 5.0 4.8  NEUTROABS 1.2*  --   --   --   --   HGB 8.4*   < > 8.1* 9.1* 8.2*  HCT 27.3*   < > 25.6* 29.1* 26.6*  MCV 99.3  --  95.2 96.0 96.4  PLT 82*   < > 86* 132* 107*   < > = values in this interval not displayed.   Cardiac Enzymes: No results for input(s): CKTOTAL, CKMB, CKMBINDEX, TROPONINI  in the last 168 hours. CBG: No results for input(s): GLUCAP in the last 168 hours.  Studies/Results: IR Fluoro Guide CV Line Left  Result Date: 03/11/2020 INDICATION: 64 year old male with history of end-stage renal disease and malfunctioning right upper extremity fistula. Presents for temporary hemodialysis access. EXAM: NON-TUNNELED CENTRAL VENOUS HEMODIALYSIS CATHETER PLACEMENT WITH ULTRASOUND AND FLUOROSCOPIC GUIDANCE COMPARISON:  None. MEDICATIONS: None FLUOROSCOPY TIME:  Two minutes, 0 seconds (26 mGy) COMPLICATIONS: None immediate. PROCEDURE: Informed written consent was obtained from the patient after a discussion of the risks, benefits, and alternatives to treatment. Questions regarding the procedure were encouraged and answered. The left  neck and chest were prepped with chlorhexidine in a sterile fashion, and a sterile drape was applied covering the operative field. Maximum barrier sterile technique with sterile gowns and gloves were used for the procedure. A timeout was performed prior to the initiation of the procedure. After the overlying soft tissues were anesthetized, a small venotomy incision was created and a micropuncture kit was utilized to access the internal jugular vein. Real-time ultrasound guidance was utilized for vascular access including the acquisition of a permanent ultrasound image documenting patency of the accessed vessel. The microwire was utilized to measure appropriate catheter length. A J wire was advanced to the level of the IVC by using a 5 [place] the catheter to navigate beyond the indwelling right innominate vein stent which terminates at the innominate confluence. Under fluoroscopic guidance, the venotomy was serially dilated, ultimately allowing placement of a 24 cm temporary Trialysis catheter with tip ultimately terminating within the superior aspect of the right atrium. Final catheter positioning was confirmed and documented with a spot radiographic image. The catheter aspirates and flushes normally. The catheter was flushed with appropriate volume sodium citrate dwells. The catheter exit site was secured with a 0-Prolene retention suture. A dressing was placed. The patient tolerated the procedure well without immediate post procedural complication. IMPRESSION: Successful placement of a left internal jugular approach 24 cm temporary dialysis catheter with tip terminating with in the superior aspect of the right atrium. The catheter is ready for immediate use. PLAN: This catheter may be converted to a tunneled dialysis catheter at a later date as indicated. Ruthann Cancer, MD [facility] Electronically Signed   By: Ruthann Cancer MD   On: 03/11/2020 13:04    IR US Guide Vasc Access Left  Result Date: 03/11/2020 INDICATION: 64 year old male with history of end-stage renal disease and malfunctioning right upper extremity fistula. Presents for temporary hemodialysis access. EXAM: NON-TUNNELED CENTRAL VENOUS HEMODIALYSIS CATHETER PLACEMENT WITH ULTRASOUND AND FLUOROSCOPIC GUIDANCE COMPARISON:  None. MEDICATIONS: None FLUOROSCOPY TIME:  Two minutes, 0 seconds (26 mGy) COMPLICATIONS: None immediate. PROCEDURE: Informed written consent was obtained from the patient after a discussion of the risks, benefits, and alternatives to treatment. Questions regarding the procedure were encouraged and answered. The left neck and chest were prepped with chlorhexidine in a sterile fashion, and a sterile drape was applied covering the operative field. Maximum barrier sterile technique with sterile gowns and gloves were used for the procedure. A timeout was performed prior to the initiation of the procedure. After the overlying soft tissues were anesthetized, a small venotomy incision was created and a micropuncture kit was utilized to access the internal jugular vein. Real-time ultrasound guidance was utilized for vascular access including the acquisition of a permanent ultrasound image documenting patency of the accessed vessel. The microwire was utilized to measure appropriate catheter length. A J wire was  advanced to the level of the IVC by using a 5 [place] Kumpe the catheter to navigate beyond the indwelling right innominate vein stent which terminates at the innominate confluence. Under fluoroscopic guidance, the venotomy was serially dilated, ultimately allowing placement of a 24 cm temporary Trialysis catheter with tip ultimately terminating within the superior aspect of the right atrium. Final catheter positioning was confirmed and documented with a spot radiographic image. The catheter aspirates and flushes normally. The catheter was flushed with appropriate volume sodium  citrate dwells. The catheter exit site was secured with a 0-Prolene retention suture. A dressing was placed. The patient tolerated the procedure well without immediate post procedural complication. IMPRESSION: Successful placement of a left internal jugular approach 24 cm temporary dialysis catheter with tip terminating with in the superior aspect of the right atrium. The catheter is ready for immediate use. PLAN: This catheter may be converted to a tunneled dialysis catheter at a later date as indicated. Ruthann Cancer, MD [facility] Radiology Electronically Signed   By: Ruthann Cancer MD   On: 03/11/2020 13:04   VAS [place] (AVF, AVG)  Result Date: 03/10/2020 DIALYSIS ACCESS Reason for Exam: Pain, swelling, concern for infection. Access Site: Right Upper Extremity. Comparison Study: 08/10/13 Performing Technologist: Sharion Dove RVS  Examination Guidelines: A complete evaluation includes B-mode imaging, spectral Doppler, color Doppler, and power Doppler as needed of all accessible portions of each vessel. Unilateral testing is considered an integral part of a complete examination. Limited examinations for reoccurring indications may be performed as noted.  Findings:    Summary: Well circumscribed heterogenous area adjacent to the graft. No perigraft fluid noted.  *See table(s) above for measurements and observations.  Diagnosing physician: Servando Snare MD Electronically signed by Servando Snare MD on 03/10/2020 at 5:38:45 PM.   --------------------------------------------------------------------------------   Final    Medications:  . aspirin EC  81 mg Oral QHS  . atorvastatin  40 mg Oral Daily  . Chlorhexidine Gluconate Cloth  6 each Topical Daily  . ferric citrate  420 mg Oral TID with meals  . isosorbide mononitrate  30 mg Oral Daily  . metoprolol succinate  50 mg Oral Daily  . vancomycin variable dose per unstable renal function  (pharmacist dosing)   Does not apply See admin instructions     Seen in room awoken from sleep no complaints

## 2020-03-12 NOTE — Progress Notes (Signed)
PROGRESS NOTE    Logan Martin  QMG:[id number] DOB: September 12, 1955 DOA: 03/10/2020 PCP: [name], NP  Brief Narrative:Logan Martin is a 64 y.o. male with medical history significant of ESRD on home hemodialysis (4 times per week-MTTF)), coronary artery disease, chronic thrombocytopenia, anemia of chronic disease, hyperlipidemia, chronic back pain, hepatitis C, PVD, bladder cancer presented  to emergency department for evaluation of right upper arm-AV fistula site, which was more swollen and had drainage, raising concern for infection, cultures were not obtained. -Patient is afebrile in the ED, vascular surgery and nephrology consulting,  Assessment & Plan:   Right arm AV fistula swelling and drainage -Infection versus hematoma, ultrasound with fluid collection, no fever or leukocytosis, blood cultures remain negative  -Day 3 of IV vancomycin  -Nephrology and vascular surgery following, -Temporary HD catheter placed in IR yesterday -Per vascular could consider open revision down the road -Will need tunneled HD cath and clip for outpatient dialysis prior to discharge  ESRD on home hemodialysis -Last HD 10/12 -Per renal  Anemia of chronic disease -Stable, continue EPO with HD  Hypertension -Continue metoprolol, Imdur  Chronic thrombocytopenia -Monitor  CAD/peripheral vascular disease -Continue aspirin, statin  Chronic lower back pain -On oxycodone at baseline  DVT prophylaxis: SCDs due to thrombocytopenia Code Status: Full code Family Communication: No family at bedside Disposition Plan:  Status is: Inpatient  Remains inpatient appropriate because:Inpatient level of care appropriate due to severity of illness   Dispo: The patient is from: Home              Anticipated d/c is to: Home              Anticipated d/c date is: > 3 days              Patient currently is not medically stable to d/c.  Consultants:   VVS, Renal, IR   Procedures:    Antimicrobials:    Subjective: -Feels okay, denies any complaints, denies pain or discomfort in his left arm  Objective: Vitals:   03/11/20 2019 03/12/20 0500 03/12/20 0510 03/12/20 0929  BP: (!) 154/88  (!) 171/82 (!) 171/65  Pulse: 96  84 81  Resp:   16 16  Temp: 97.7 F (36.5 C)  98.4 F (36.9 C) 98.1 F (36.7 C)  TempSrc: Oral  Oral Oral  SpO2: 97%  100% 93%  Weight: 42.8 kg 42.8 kg    Height:        Intake/Output Summary (Last 24 hours) at 03/12/2020 1330 Last data filed at 03/12/2020 1100 Gross per 24 hour  Intake 480 ml  Output 1400 ml  Net -920 ml   Filed Weights   03/11/20 1815 03/11/20 2019 03/12/20 0500  Weight: 47.1 kg 42.8 kg 42.8 kg    Examination:  General, Pleasant male laying in bed, AAOx3, no distress CVS: S1-S2, regular rate rhythm Lungs: Clear bilaterally Abdomen: Soft, nontender, bowel sounds present Extremities: No edema, right arm AV fistula with swelling, large scab, no discharge  Psychiatry:Mood & affect appropriate.     Data Reviewed:   CBC: Recent Labs  Lab 03/10/20 0144 03/11/20 0339 03/11/20 1533 03/12/20 0350  WBC 3.6* 4.3 5.0 4.8  NEUTROABS 1.2*  --   --   --   HGB 8.4* 8.1* 9.1* 8.2*  HCT 27.3* 25.6* 29.1* 26.6*  MCV 99.3 95.2 96.0 96.4  PLT 82* 86* 132* 326*   Basic Metabolic Panel: Recent Labs  Lab 03/10/20 0144 03/10/20 1741 03/11/20 0339 03/12/20  0350  NA 138  --  135 138  K 4.4  --  4.7 4.5  CL 92*  --  93* 97*  CO2 32  --  28 29  GLUCOSE 90  --  94 83  BUN 57*  --  72* 33*  CREATININE 7.46*  --  9.66* 6.88*  CALCIUM 10.3  --  10.2 9.8  MG 2.1 1.9  --   --    GFR: Estimated Creatinine Clearance: 6.6 mL/min (A) (by C-G formula based on SCr of 6.88 mg/dL (H)). Liver Function Tests: Recent Labs  Lab 03/10/20 0144 03/11/20 0339  AST 18 15  ALT 11 12  ALKPHOS 73 62  BILITOT 0.7 0.7  PROT 7.1 6.4*  ALBUMIN 3.6 3.3*   No results for input(s): LIPASE, AMYLASE in the last 168 hours. No results  for input(s): AMMONIA in the last 168 hours. Coagulation Profile: Recent Labs  Lab 03/10/20 0144  INR 1.1   Cardiac Enzymes: No results for input(s): CKTOTAL, CKMB, CKMBINDEX, TROPONINI in the last 168 hours. BNP (last 3 results) No results for input(s): PROBNP in the last 8760 hours. HbA1C: No results for input(s): HGBA1C in the last 72 hours. CBG: No results for input(s): GLUCAP in the last 168 hours. Lipid Profile: No results for input(s): CHOL, HDL, LDLCALC, TRIG, CHOLHDL, LDLDIRECT in the last 72 hours. Thyroid Function Tests: No results for input(s): TSH, T4TOTAL, FREET4, T3FREE, THYROIDAB in the last 72 hours. Anemia Panel: No results for input(s): VITAMINB12, FOLATE, FERRITIN, TIBC, IRON, RETICCTPCT in the last 72 hours. Urine analysis:    Component Value Date/Time   COLORURINE STRAW (A) 03/28/2018 0300   APPEARANCEUR CLEAR 03/28/2018 0300   LABSPEC 1.000 (L) 03/28/2018 0300   PHURINE 6.0 03/28/2018 0300   GLUCOSEU NEGATIVE 03/28/2018 0300   HGBUR NEGATIVE 03/28/2018 0300   BILIRUBINUR NEGATIVE 03/28/2018 0300   KETONESUR NEGATIVE 03/28/2018 0300   PROTEINUR NEGATIVE 03/28/2018 0300   NITRITE NEGATIVE 03/28/2018 0300   LEUKOCYTESUR NEGATIVE 03/28/2018 0300   Sepsis Labs: @LABRCNTIP (procalcitonin:4,lacticidven:4)  ) Recent Results (from the past 240 hour(s))  Culture, blood (Routine x 2)     Status: None (Preliminary result)   Collection Time: 03/10/20  1:30 AM   Specimen: BLOOD LEFT ARM  Result Value Ref Range Status   Specimen Description BLOOD LEFT ARM  Final   Special Requests   Final    BOTTLES DRAWN AEROBIC AND ANAEROBIC Blood Culture adequate volume   Culture   Final    NO GROWTH 2 DAYS Performed at [facility], [place] [address] [place], [place] [address]    Report Status PENDING  Incomplete  Culture, blood (Routine x 2)     Status: None (Preliminary result)   Collection Time: 03/10/20  1:46 AM   Specimen: BLOOD LEFT HAND  Result Value Ref  Range Status   Specimen Description BLOOD LEFT HAND  Final   Special Requests   Final    AEROBIC BOTTLE ONLY Blood Culture results may not be optimal due to an excessive volume of blood received in culture bottles   Culture   Final    NO GROWTH 2 DAYS Performed at [facility], [place] [address], [place], [place] [address]    Report Status PENDING  Incomplete  Respiratory Panel by RT PCR (Flu A&B, Covid) - Nasopharyngeal Swab     Status: None   Collection Time: 03/10/20 11:41 AM   Specimen: Nasopharyngeal Swab  Result Value Ref Range Status  SARS Coronavirus 2 by RT PCR NEGATIVE NEGATIVE Final    Comment: (NOTE) SARS-CoV-2 target nucleic acids are NOT DETECTED.  The SARS-CoV-2 RNA is generally detectable in upper respiratoy specimens during the acute phase of infection. The lowest concentration of SARS-CoV-2 viral copies this assay can detect is 131 copies/mL. A negative result does not preclude SARS-Cov-2 infection and should not be used as the sole basis for treatment or other patient management decisions. A negative result may occur with  improper specimen collection/handling, submission of specimen other than nasopharyngeal swab, presence of viral mutation(s) within the areas targeted by this assay, and inadequate number of viral copies (<131 copies/mL). A negative result must be combined with clinical observations, patient history, and epidemiological information. The expected result is Negative.  Fact Sheet for Patients:  PinkCheek.be  Fact Sheet for Healthcare Providers:  GravelBags.it  This test is no t yet approved or cleared by the [place] FDA and  has been authorized for detection and/or diagnosis of SARS-CoV-2 by FDA under an Emergency Use Authorization (EUA). This EUA will remain  in effect (meaning this test can be used) for the duration of the COVID-19 declaration under Section 564(b)(1) of the  Act, 21 U.S.C. section 360bbb-3(b)(1), unless the authorization is terminated or revoked sooner.     Influenza A by PCR NEGATIVE NEGATIVE Final   Influenza B by PCR NEGATIVE NEGATIVE Final    Comment: (NOTE) The Xpert Xpress SARS-CoV-2/FLU/RSV assay is intended as an aid in  the diagnosis of influenza from Nasopharyngeal swab specimens and  should not be used as a sole basis for treatment. Nasal washings and  aspirates are unacceptable for Xpert Xpress SARS-CoV-2/FLU/RSV  testing.  Fact Sheet for Patients: PinkCheek.be  Fact Sheet for Healthcare Providers: GravelBags.it  This test is not yet approved or cleared by the [place] FDA and  has been authorized for detection and/or diagnosis of SARS-CoV-2 by  FDA under an Emergency Use Authorization (EUA). This EUA will remain  in effect (meaning this test can be used) for the duration of the  Covid-19 declaration under Section 564(b)(1) of the Act, 21  U.S.C. section 360bbb-3(b)(1), unless the authorization is  terminated or revoked. Performed at [facility], [place] [address], [place], [place] [address]     Radiology Studies: IR Fluoro Guide CV Line Left  Result Date: 03/11/2020 INDICATION: 64 year old male with history of end-stage renal disease and malfunctioning right upper extremity fistula. Presents for temporary hemodialysis access. EXAM: NON-TUNNELED CENTRAL VENOUS HEMODIALYSIS CATHETER PLACEMENT WITH ULTRASOUND AND FLUOROSCOPIC GUIDANCE COMPARISON:  None. MEDICATIONS: None FLUOROSCOPY TIME:  Two minutes, 0 seconds (26 mGy) COMPLICATIONS: None immediate. PROCEDURE: Informed written consent was obtained from the patient after a discussion of the risks, benefits, and alternatives to treatment. Questions regarding the procedure were encouraged and answered. The left neck and chest were prepped with chlorhexidine in a sterile fashion, and a sterile drape was applied  covering the operative field. Maximum barrier sterile technique with sterile gowns and gloves were used for the procedure. A timeout was performed prior to the initiation of the procedure. After the overlying soft tissues were anesthetized, a small venotomy incision was created and a micropuncture kit was utilized to access the internal jugular vein. Real-time ultrasound guidance was utilized for vascular access including the acquisition of a permanent ultrasound image documenting patency of the accessed vessel. The microwire was utilized to measure appropriate catheter length. A J wire was advanced to the level of the IVC by using  a 5 [place] the catheter to navigate beyond the indwelling right innominate vein stent which terminates at the innominate confluence. Under fluoroscopic guidance, the venotomy was serially dilated, ultimately allowing placement of a 24 cm temporary Trialysis catheter with tip ultimately terminating within the superior aspect of the right atrium. Final catheter positioning was confirmed and documented with a spot radiographic image. The catheter aspirates and flushes normally. The catheter was flushed with appropriate volume sodium citrate dwells. The catheter exit site was secured with a 0-Prolene retention suture. A dressing was placed. The patient tolerated the procedure well without immediate post procedural complication. IMPRESSION: Successful placement of a left internal jugular approach 24 cm temporary dialysis catheter with tip terminating with in the superior aspect of the right atrium. The catheter is ready for immediate use. PLAN: This catheter may be converted to a tunneled dialysis catheter at a later date as indicated. Ruthann Cancer, MD [facility] Radiology Electronically Signed   By: Ruthann Cancer MD   On: 03/11/2020 13:04   IR US Guide Vasc Access Left  Result Date: 03/11/2020 INDICATION: 64 year old male with  history of end-stage renal disease and malfunctioning right upper extremity fistula. Presents for temporary hemodialysis access. EXAM: NON-TUNNELED CENTRAL VENOUS HEMODIALYSIS CATHETER PLACEMENT WITH ULTRASOUND AND FLUOROSCOPIC GUIDANCE COMPARISON:  None. MEDICATIONS: None FLUOROSCOPY TIME:  Two minutes, 0 seconds (26 mGy) COMPLICATIONS: None immediate. PROCEDURE: Informed written consent was obtained from the patient after a discussion of the risks, benefits, and alternatives to treatment. Questions regarding the procedure were encouraged and answered. The left neck and chest were prepped with chlorhexidine in a sterile fashion, and a sterile drape was applied covering the operative field. Maximum barrier sterile technique with sterile gowns and gloves were used for the procedure. A timeout was performed prior to the initiation of the procedure. After the overlying soft tissues were anesthetized, a small venotomy incision was created and a micropuncture kit was utilized to access the internal jugular vein. Real-time ultrasound guidance was utilized for vascular access including the acquisition of a permanent ultrasound image documenting patency of the accessed vessel. The microwire was utilized to measure appropriate catheter length. A J wire was advanced to the level of the IVC by using a 5 [place] Kumpe the catheter to navigate beyond the indwelling right innominate vein stent which terminates at the innominate confluence. Under fluoroscopic guidance, the venotomy was serially dilated, ultimately allowing placement of a 24 cm temporary Trialysis catheter with tip ultimately terminating within the superior aspect of the right atrium. Final catheter positioning was confirmed and documented with a spot radiographic image. The catheter aspirates and flushes normally. The catheter was flushed with appropriate volume sodium citrate dwells. The catheter exit site was secured with a 0-Prolene retention suture. A dressing  was placed. The patient tolerated the procedure well without immediate post procedural complication. IMPRESSION: Successful placement of a left internal jugular approach 24 cm temporary dialysis catheter with tip terminating with in the superior aspect of the right atrium. The catheter is ready for immediate use. PLAN: This catheter may be converted to a tunneled dialysis catheter at a later date as indicated. Ruthann Cancer, MD [facility] Radiology Electronically Signed   By: Ruthann Cancer MD   On: 03/11/2020 13:04   VAS [place] (AVF, AVG)  Result Date: 03/10/2020 DIALYSIS ACCESS Reason for Exam: Pain, swelling, concern for infection. Access Site: Right Upper Extremity. Comparison Study: 08/10/13 Performing Technologist: Hal Hope  Mauro Kaufmann RVS  Examination Guidelines: A complete evaluation includes B-mode imaging, spectral Doppler, color Doppler, and power Doppler as needed of all accessible portions of each vessel. Unilateral testing is considered an integral part of a complete examination. Limited examinations for reoccurring indications may be performed as noted.  Findings:    Summary: Well circumscribed heterogenous area adjacent to the graft. No perigraft fluid noted.  *See table(s) above for measurements and observations.  Diagnosing physician: Servando Snare MD Electronically signed by Servando Snare MD on 03/10/2020 at 5:38:45 PM.   --------------------------------------------------------------------------------   Final    Scheduled Meds:  aspirin EC  81 mg Oral QHS   atorvastatin  40 mg Oral Daily   Chlorhexidine Gluconate Cloth  6 each Topical Daily   ferric citrate  420 mg Oral TID with meals   isosorbide mononitrate  30 mg Oral Daily   metoprolol succinate  50 mg Oral Daily   vancomycin variable dose per unstable renal function (pharmacist dosing)   Does not apply See admin instructions   Continuous Infusions:   LOS: 2  days   Time spent: 33mn  PDomenic Polite MD [facility]  03/12/2020, 1:30 PM

## 2020-03-13 LAB — CBC
HCT: 27.9 % — ABNORMAL LOW (ref 39.0–52.0)
Hemoglobin: 8.6 g/dL — ABNORMAL LOW (ref 13.0–17.0)
MCH: 29.8 pg (ref 26.0–34.0)
MCHC: 30.8 g/dL (ref 30.0–36.0)
MCV: 96.5 fL (ref 80.0–100.0)
Platelets: 106 10*3/uL — ABNORMAL LOW (ref 150–400)
RBC: 2.89 MIL/uL — ABNORMAL LOW (ref 4.22–5.81)
RDW: 16.6 % — ABNORMAL HIGH (ref 11.5–15.5)
WBC: 4.6 10*3/uL (ref 4.0–10.5)
nRBC: 0 % (ref 0.0–0.2)

## 2020-03-13 MED ORDER — MELATONIN 3 MG PO TABS
3.0000 mg | ORAL_TABLET | Freq: Every evening | ORAL | Status: DC | PRN
  Administered 2020-03-13 – 2020-03-16 (×4): 3 mg via ORAL
  Filled 2020-03-13 (×4): qty 1

## 2020-03-13 MED ORDER — AMLODIPINE BESYLATE 10 MG PO TABS
10.0000 mg | ORAL_TABLET | Freq: Every day | ORAL | Status: DC
  Administered 2020-03-13 – 2020-03-17 (×4): 10 mg via ORAL
  Filled 2020-03-13 (×4): qty 1

## 2020-03-13 MED ORDER — VANCOMYCIN HCL IN DEXTROSE 500-5 MG/100ML-% IV SOLN
500.0000 mg | INTRAVENOUS | Status: DC
  Administered 2020-03-14: 500 mg via INTRAVENOUS
  Filled 2020-03-13 (×3): qty 100

## 2020-03-13 NOTE — Progress Notes (Signed)
PROGRESS NOTE    Logan Martin  XJD:[id number] DOB: Apr 28, 1956 DOA: 03/10/2020 PCP: Logan Beals, NP  Brief Narrative:Logan Martin is a 64 y.o. male with medical history significant of ESRD on home hemodialysis (4 times per week-MTTF)), coronary artery disease, chronic thrombocytopenia, anemia of chronic disease, hyperlipidemia, chronic back pain, hepatitis C, PVD, bladder cancer presented  to emergency department for evaluation of right upper arm-AV fistula site, which was more swollen and had drainage, raising concern for infection, cultures were not obtained. -Patient is afebrile in the ED, vascular surgery and nephrology consulting,  Assessment & Plan:   Right arm AV fistula swelling and drainage -Infection versus hematoma, ultrasound with fluid collection, no fever or leukocytosis, blood cultures remain negative  -Day 4 of IV vancomycin  -Nephrology and vascular surgery following, -Temporary HD catheter placed in IR 10/15 -Per vascular could consider open revision down the road -Will need tunneled HD cath and clip for outpatient dialysis prior to discharge -per Renal  ESRD on home hemodialysis -Last HD 10/12 -Per renal  Anemia of chronic disease -Stable, continue EPO with HD  Hypertension -Continue metoprolol, Imdur  Chronic thrombocytopenia -Monitor  CAD/peripheral vascular disease -Continue aspirin, statin  Chronic lower back pain -On oxycodone at baseline  DVT prophylaxis: SCDs due to thrombocytopenia Code Status: Full code Family Communication: No family at bedside Disposition Plan:  Status is: Inpatient  Remains inpatient appropriate because:Inpatient level of care appropriate due to severity of illness   Dispo: The patient is from: Home              Anticipated d/c is to: Home              Anticipated d/c date is: > 3 days              Patient currently is not medically stable to d/c.  Consultants:   VVS, Renal, IR   Procedures:    Antimicrobials:    Subjective: -Feels well, no complaints, no discomfort in his left arm  Objective: Vitals:   03/12/20 2038 03/13/20 0559 03/13/20 0841 03/13/20 0932  BP: (!) 177/86 (!) 186/89 (!) 188/72 (!) 151/77  Pulse: 90 88  92  Resp: 18 18  18   Temp: 98.4 F (36.9 C) 98.8 F (37.1 C) 98 F (36.7 C) 97.6 F (36.4 C)  TempSrc: Oral Oral Oral Oral  SpO2: 96% 100%  100%  Weight: 43.5 kg     Height:        Intake/Output Summary (Last 24 hours) at 03/13/2020 1022 Last data filed at 03/13/2020 0600 Gross per 24 hour  Intake 720 ml  Output 0 ml  Net 720 ml   Filed Weights   03/11/20 2019 03/12/20 0500 03/12/20 2038  Weight: 42.8 kg 42.8 kg 43.5 kg    Examination:  General: Pleasant male laying in bed, AAOx3, no distress CVS: S1-S2, regular rate rhythm Lungs: Clear bilaterally Abdomen: Soft, nontender, bowel sounds present Extremities: No edema, right arm AV fistula with swelling large scab and no discharge  Psychiatry:Mood & affect appropriate.     Data Reviewed:   CBC: Recent Labs  Lab 03/10/20 0144 03/11/20 0339 03/11/20 1533 03/12/20 0350 03/13/20 0411  WBC 3.6* 4.3 5.0 4.8 4.6  NEUTROABS 1.2*  --   --   --   --   HGB 8.4* 8.1* 9.1* 8.2* 8.6*  HCT 27.3* 25.6* 29.1* 26.6* 27.9*  MCV 99.3 95.2 96.0 96.4 96.5  PLT 82* 86* 132* 107* 106*   Basic  Metabolic Panel: Recent Labs  Lab 03/10/20 0144 03/10/20 1741 03/11/20 0339 03/12/20 0350  NA 138  --  135 138  K 4.4  --  4.7 4.5  CL 92*  --  93* 97*  CO2 32  --  28 29  GLUCOSE 90  --  94 83  BUN 57*  --  72* 33*  CREATININE 7.46*  --  9.66* 6.88*  CALCIUM 10.3  --  10.2 9.8  MG 2.1 1.9  --   --    GFR: Estimated Creatinine Clearance: 6.7 mL/min (A) (by C-G formula based on SCr of 6.88 mg/dL (H)). Liver Function Tests: Recent Labs  Lab 03/10/20 0144 03/11/20 0339  AST 18 15  ALT 11 12  ALKPHOS 73 62  BILITOT 0.7 0.7  PROT 7.1 6.4*  ALBUMIN 3.6 3.3*   No results for input(s):  LIPASE, AMYLASE in the last 168 hours. No results for input(s): AMMONIA in the last 168 hours. Coagulation Profile: Recent Labs  Lab 03/10/20 0144  INR 1.1   Cardiac Enzymes: No results for input(s): CKTOTAL, CKMB, CKMBINDEX, TROPONINI in the last 168 hours. BNP (last 3 results) No results for input(s): PROBNP in the last 8760 hours. HbA1C: No results for input(s): HGBA1C in the last 72 hours. CBG: No results for input(s): GLUCAP in the last 168 hours. Lipid Profile: No results for input(s): CHOL, HDL, LDLCALC, TRIG, CHOLHDL, LDLDIRECT in the last 72 hours. Thyroid Function Tests: No results for input(s): TSH, T4TOTAL, FREET4, T3FREE, THYROIDAB in the last 72 hours. Anemia Panel: No results for input(s): VITAMINB12, FOLATE, FERRITIN, TIBC, IRON, RETICCTPCT in the last 72 hours. Urine analysis:    Component Value Date/Time   COLORURINE STRAW (A) 03/28/2018 0300   APPEARANCEUR CLEAR 03/28/2018 0300   LABSPEC 1.000 (L) 03/28/2018 0300   PHURINE 6.0 03/28/2018 0300   GLUCOSEU NEGATIVE 03/28/2018 0300   HGBUR NEGATIVE 03/28/2018 0300   BILIRUBINUR NEGATIVE 03/28/2018 0300   KETONESUR NEGATIVE 03/28/2018 0300   PROTEINUR NEGATIVE 03/28/2018 0300   NITRITE NEGATIVE 03/28/2018 0300   LEUKOCYTESUR NEGATIVE 03/28/2018 0300   Sepsis Labs: @LABRCNTIP (procalcitonin:4,lacticidven:4)  ) Recent Results (from the past 240 hour(s))  Culture, blood (Routine x 2)     Status: None (Preliminary result)   Collection Time: 03/10/20  1:30 AM   Specimen: BLOOD LEFT ARM  Result Value Ref Range Status   Specimen Description BLOOD LEFT ARM  Final   Special Requests   Final    BOTTLES DRAWN AEROBIC AND ANAEROBIC Blood Culture adequate volume   Culture   Final    NO GROWTH 3 DAYS Performed at [facility], [place] [address] [place], [place] [address]    Report Status PENDING  Incomplete  Culture, blood (Routine x 2)     Status: None (Preliminary result)   Collection Time: 03/10/20  1:46  AM   Specimen: BLOOD LEFT HAND  Result Value Ref Range Status   Specimen Description BLOOD LEFT HAND  Final   Special Requests   Final    AEROBIC BOTTLE ONLY Blood Culture results may not be optimal due to an excessive volume of blood received in culture bottles   Culture   Final    NO GROWTH 3 DAYS Performed at [facility], [place] [address], [place], [place] [address]    Report Status PENDING  Incomplete  Respiratory Panel by RT PCR (Flu A&B, Covid) - Nasopharyngeal Swab     Status: None   Collection Time: 03/10/20 11:41  AM   Specimen: Nasopharyngeal Swab  Result Value Ref Range Status   SARS Coronavirus 2 by RT PCR NEGATIVE NEGATIVE Final    Comment: (NOTE) SARS-CoV-2 target nucleic acids are NOT DETECTED.  The SARS-CoV-2 RNA is generally detectable in upper respiratoy specimens during the acute phase of infection. The lowest concentration of SARS-CoV-2 viral copies this assay can detect is 131 copies/mL. A negative result does not preclude SARS-Cov-2 infection and should not be used as the sole basis for treatment or other patient management decisions. A negative result may occur with  improper specimen collection/handling, submission of specimen other than nasopharyngeal swab, presence of viral mutation(s) within the areas targeted by this assay, and inadequate number of viral copies (<131 copies/mL). A negative result must be combined with clinical observations, patient history, and epidemiological information. The expected result is Negative.  Fact Sheet for Patients:  PinkCheek.be  Fact Sheet for Healthcare Providers:  GravelBags.it  This test is no t yet approved or cleared by the [place] FDA and  has been authorized for detection and/or diagnosis of SARS-CoV-2 by FDA under an Emergency Use Authorization (EUA). This EUA will remain  in effect (meaning this test can be used) for the duration of  the COVID-19 declaration under Section 564(b)(1) of the Act, 21 U.S.C. section 360bbb-3(b)(1), unless the authorization is terminated or revoked sooner.     Influenza A by PCR NEGATIVE NEGATIVE Final   Influenza B by PCR NEGATIVE NEGATIVE Final    Comment: (NOTE) The Xpert Xpress SARS-CoV-2/FLU/RSV assay is intended as an aid in  the diagnosis of influenza from Nasopharyngeal swab specimens and  should not be used as a sole basis for treatment. Nasal washings and  aspirates are unacceptable for Xpert Xpress SARS-CoV-2/FLU/RSV  testing.  Fact Sheet for Patients: PinkCheek.be  Fact Sheet for Healthcare Providers: GravelBags.it  This test is not yet approved or cleared by the [place] FDA and  has been authorized for detection and/or diagnosis of SARS-CoV-2 by  FDA under an Emergency Use Authorization (EUA). This EUA will remain  in effect (meaning this test can be used) for the duration of the  Covid-19 declaration under Section 564(b)(1) of the Act, 21  U.S.C. section 360bbb-3(b)(1), unless the authorization is  terminated or revoked. Performed at [facility], [place] [address], [place], [place] [address]     Radiology Studies: IR Fluoro Guide CV Line Left  Result Date: 03/11/2020 INDICATION: 64 year old male with history of end-stage renal disease and malfunctioning right upper extremity fistula. Presents for temporary hemodialysis access. EXAM: NON-TUNNELED CENTRAL VENOUS HEMODIALYSIS CATHETER PLACEMENT WITH ULTRASOUND AND FLUOROSCOPIC GUIDANCE COMPARISON:  None. MEDICATIONS: None FLUOROSCOPY TIME:  Two minutes, 0 seconds (26 mGy) COMPLICATIONS: None immediate. PROCEDURE: Informed written consent was obtained from the patient after a discussion of the risks, benefits, and alternatives to treatment. Questions regarding the procedure were encouraged and answered. The left neck and chest were prepped with  chlorhexidine in a sterile fashion, and a sterile drape was applied covering the operative field. Maximum barrier sterile technique with sterile gowns and gloves were used for the procedure. A timeout was performed prior to the initiation of the procedure. After the overlying soft tissues were anesthetized, a small venotomy incision was created and a micropuncture kit was utilized to access the internal jugular vein. Real-time ultrasound guidance was utilized for vascular access including the acquisition of a permanent ultrasound image documenting patency of the accessed vessel. The microwire was utilized to measure appropriate catheter  length. A J wire was advanced to the level of the IVC by using a 5 [place] Kumpe the catheter to navigate beyond the indwelling right innominate vein stent which terminates at the innominate confluence. Under fluoroscopic guidance, the venotomy was serially dilated, ultimately allowing placement of a 24 cm temporary Trialysis catheter with tip ultimately terminating within the superior aspect of the right atrium. Final catheter positioning was confirmed and documented with a spot radiographic image. The catheter aspirates and flushes normally. The catheter was flushed with appropriate volume sodium citrate dwells. The catheter exit site was secured with a 0-Prolene retention suture. A dressing was placed. The patient tolerated the procedure well without immediate post procedural complication. IMPRESSION: Successful placement of a left internal jugular approach 24 cm temporary dialysis catheter with tip terminating with in the superior aspect of the right atrium. The catheter is ready for immediate use. PLAN: This catheter may be converted to a tunneled dialysis catheter at a later date as indicated. Ruthann Cancer, MD [facility] Radiology Electronically Signed   By: Ruthann Cancer MD   On: 03/11/2020 13:04   IR US Guide Vasc Access  Left  Result Date: 03/11/2020 INDICATION: 64 year old male with history of end-stage renal disease and malfunctioning right upper extremity fistula. Presents for temporary hemodialysis access. EXAM: NON-TUNNELED CENTRAL VENOUS HEMODIALYSIS CATHETER PLACEMENT WITH ULTRASOUND AND FLUOROSCOPIC GUIDANCE COMPARISON:  None. MEDICATIONS: None FLUOROSCOPY TIME:  Two minutes, 0 seconds (26 mGy) COMPLICATIONS: None immediate. PROCEDURE: Informed written consent was obtained from the patient after a discussion of the risks, benefits, and alternatives to treatment. Questions regarding the procedure were encouraged and answered. The left neck and chest were prepped with chlorhexidine in a sterile fashion, and a sterile drape was applied covering the operative field. Maximum barrier sterile technique with sterile gowns and gloves were used for the procedure. A timeout was performed prior to the initiation of the procedure. After the overlying soft tissues were anesthetized, a small venotomy incision was created and a micropuncture kit was utilized to access the internal jugular vein. Real-time ultrasound guidance was utilized for vascular access including the acquisition of a permanent ultrasound image documenting patency of the accessed vessel. The microwire was utilized to measure appropriate catheter length. A J wire was advanced to the level of the IVC by using a 5 [place] Kumpe the catheter to navigate beyond the indwelling right innominate vein stent which terminates at the innominate confluence. Under fluoroscopic guidance, the venotomy was serially dilated, ultimately allowing placement of a 24 cm temporary Trialysis catheter with tip ultimately terminating within the superior aspect of the right atrium. Final catheter positioning was confirmed and documented with a spot radiographic image. The catheter aspirates and flushes normally. The catheter was flushed with appropriate volume sodium citrate dwells. The catheter  exit site was secured with a 0-Prolene retention suture. A dressing was placed. The patient tolerated the procedure well without immediate post procedural complication. IMPRESSION: Successful placement of a left internal jugular approach 24 cm temporary dialysis catheter with tip terminating with in the superior aspect of the right atrium. The catheter is ready for immediate use. PLAN: This catheter may be converted to a tunneled dialysis catheter at a later date as indicated. Ruthann Cancer, MD [facility] Radiology Electronically Signed   By: Ruthann Cancer MD   On: 03/11/2020 13:04   Scheduled Meds:  amLODipine  10 mg Oral Daily   aspirin EC  81 mg Oral QHS  atorvastatin  40 mg Oral Daily   Chlorhexidine Gluconate Cloth  6 each Topical Daily   ferric citrate  420 mg Oral TID with meals   isosorbide mononitrate  30 mg Oral Daily   metoprolol succinate  50 mg Oral Daily   [START ON 03/14/2020] vancomycin  500 mg Intravenous Q M,W,F-HD   Continuous Infusions:   LOS: 3 days   Time spent: 4mn  PDomenic Polite MD [facility]  03/13/2020, 10:22 AM

## 2020-03-13 NOTE — Progress Notes (Signed)
Subjective: Seen//examined lying in bed no complaints, states pain left arm is resolved swelling improved  Objective Vital signs in last 24 hours: Vitals:   03/12/20 2038 03/13/20 0559 03/13/20 0841 03/13/20 0932  BP: (!) 177/86 (!) 186/89 (!) 188/72 (!) 151/77  Pulse: 90 88  92  Resp: 18 18  18   Temp: 98.4 F (36.9 C) 98.8 F (37.1 C) 98 F (36.7 C) 97.6 F (36.4 C)  TempSrc: Oral Oral Oral Oral  SpO2: 96% 100%  100%  Weight: 43.5 kg     Height:       Weight change: -5.1 kg  Physical Exam: General:Alert thin male, NAD Heart:RRR, no MGR Lungs:CTA, nonlabored breathing room air Abdomen:Bowel sounds positive normoactive, soft nontender nondistended, no ascites Extremities:No pedal edema Dialysis Access:Right upper extremity AV fistula positive bruit with swelling and nontender this a.m. no discharge appreciated Left IJ temporary catheter in place   OPDialysis Orders: Home HD NxStage MTTS EDW 50 kg  OP Labs 10/7: Hgb 9.8, K 7.1!, Ca 10.7, P 5.3, PTH 209   Problem/Plan: 1. Infected RUE AVF versus hematoma- VVS  has evaluated  by Dr. Donzetta Matters   as noted   On IV vancomycin day #4 blood cx  no growth to date .  Wound Cx collected OP center 10/13= heavy growth corynebacterium species.  Resting use of AV fistula using temporary left IJ cath need direction from VVS when to place PermCath 2. ESRD - HomeHD.  In hospital plan MWF, HD 10/15 via temporary left IJ next dialysis in a.m. 3. Hypertension/volume - BP proving when given home meds.  Metoprolol XL 50 mg and Imdur 30, . UF 1.4 L 10/15 on dialysis questionable weight with patient 7 kg below dry weight after dialysisChest x-ray shows no volume overload 4. Anemia - Hgb8.6 <8.2<8.1<8.4. Received Aranesp 200 at outpatient center 16/10. 5. Metabolic bone disease - Continue Auryixa binder. Hold calcitriol for now d/t elevated Ca  Ernest Haber, PA-C [facility] [phone] 03/13/2020,11:45 AM   LOS: 3 days   Labs: Basic Metabolic Panel: Recent Labs  Lab 03/10/20 0144 03/11/20 0339 03/12/20 0350  NA 138 135 138  K 4.4 4.7 4.5  CL 92* 93* 97*  CO2 32 28 29  GLUCOSE 90 94 83  BUN 57* 72* 33*  CREATININE 7.46* 9.66* 6.88*  CALCIUM 10.3 10.2 9.8   Liver Function Tests: Recent Labs  Lab 03/10/20 0144 03/11/20 0339  AST 18 15  ALT 11 12  ALKPHOS 73 62  BILITOT 0.7 0.7  PROT 7.1 6.4*  ALBUMIN 3.6 3.3*   No results for input(s): LIPASE, AMYLASE in the last 168 hours. No results for input(s): AMMONIA in the last 168 hours. CBC: Recent Labs  Lab 03/10/20 0144 03/10/20 0144 03/11/20 0339 03/11/20 0339 03/11/20 1533 03/12/20 0350 03/13/20 0411  WBC 3.6*   < > 4.3   < > 5.0 4.8 4.6  NEUTROABS 1.2*  --   --   --   --   --   --   HGB 8.4*   < > 8.1*   < > 9.1* 8.2* 8.6*  HCT 27.3*   < > 25.6*   < > 29.1* 26.6* 27.9*  MCV 99.3  --  95.2  --  96.0 96.4 96.5  PLT 82*   < > 86*   < > 132* 107* 106*   < > = values in this interval not displayed.   Cardiac Enzymes: No results for input(s): CKTOTAL, CKMB, CKMBINDEX, TROPONINI  in the last 168 hours. CBG: No results for input(s): GLUCAP in the last 168 hours.  Studies/Results: IR Fluoro Guide CV Line Left  Result Date: 03/11/2020 INDICATION: 64 year old male with history of end-stage renal disease and malfunctioning right upper extremity fistula. Presents for temporary hemodialysis access. EXAM: NON-TUNNELED CENTRAL VENOUS HEMODIALYSIS CATHETER PLACEMENT WITH ULTRASOUND AND FLUOROSCOPIC GUIDANCE COMPARISON:  None. MEDICATIONS: None FLUOROSCOPY TIME:  Two minutes, 0 seconds (26 mGy) COMPLICATIONS: None immediate. PROCEDURE: Informed written consent was obtained from the patient after a discussion of the risks, benefits, and alternatives to treatment. Questions regarding the procedure were encouraged and answered. The left neck and chest were prepped with chlorhexidine in a sterile fashion, and a sterile drape was applied  covering the operative field. Maximum barrier sterile technique with sterile gowns and gloves were used for the procedure. A timeout was performed prior to the initiation of the procedure. After the overlying soft tissues were anesthetized, a small venotomy incision was created and a micropuncture kit was utilized to access the internal jugular vein. Real-time ultrasound guidance was utilized for vascular access including the acquisition of a permanent ultrasound image documenting patency of the accessed vessel. The microwire was utilized to measure appropriate catheter length. A J wire was advanced to the level of the IVC by using a 5 [place] Kumpe the catheter to navigate beyond the indwelling right innominate vein stent which terminates at the innominate confluence. Under fluoroscopic guidance, the venotomy was serially dilated, ultimately allowing placement of a 24 cm temporary Trialysis catheter with tip ultimately terminating within the superior aspect of the right atrium. Final catheter positioning was confirmed and documented with a spot radiographic image. The catheter aspirates and flushes normally. The catheter was flushed with appropriate volume sodium citrate dwells. The catheter exit site was secured with a 0-Prolene retention suture. A dressing was placed. The patient tolerated the procedure well without immediate post procedural complication. IMPRESSION: Successful placement of a left internal jugular approach 24 cm temporary dialysis catheter with tip terminating with in the superior aspect of the right atrium. The catheter is ready for immediate use. PLAN: This catheter may be converted to a tunneled dialysis catheter at a later date as indicated. Ruthann Cancer, MD [facility] Radiology Electronically Signed   By: Ruthann Cancer MD   On: 03/11/2020 13:04   IR US Guide Vasc Access Left  Result Date: 03/11/2020 INDICATION: 64 year old male with  history of end-stage renal disease and malfunctioning right upper extremity fistula. Presents for temporary hemodialysis access. EXAM: NON-TUNNELED CENTRAL VENOUS HEMODIALYSIS CATHETER PLACEMENT WITH ULTRASOUND AND FLUOROSCOPIC GUIDANCE COMPARISON:  None. MEDICATIONS: None FLUOROSCOPY TIME:  Two minutes, 0 seconds (26 mGy) COMPLICATIONS: None immediate. PROCEDURE: Informed written consent was obtained from the patient after a discussion of the risks, benefits, and alternatives to treatment. Questions regarding the procedure were encouraged and answered. The left neck and chest were prepped with chlorhexidine in a sterile fashion, and a sterile drape was applied covering the operative field. Maximum barrier sterile technique with sterile gowns and gloves were used for the procedure. A timeout was performed prior to the initiation of the procedure. After the overlying soft tissues were anesthetized, a small venotomy incision was created and a micropuncture kit was utilized to access the internal jugular vein. Real-time ultrasound guidance was utilized for vascular access including the acquisition of a permanent ultrasound image documenting patency of the accessed vessel. The microwire was utilized to measure appropriate catheter length. A J wire was  advanced to the level of the IVC by using a 5 [place] Kumpe the catheter to navigate beyond the indwelling right innominate vein stent which terminates at the innominate confluence. Under fluoroscopic guidance, the venotomy was serially dilated, ultimately allowing placement of a 24 cm temporary Trialysis catheter with tip ultimately terminating within the superior aspect of the right atrium. Final catheter positioning was confirmed and documented with a spot radiographic image. The catheter aspirates and flushes normally. The catheter was flushed with appropriate volume sodium citrate dwells. The catheter exit site was secured with a 0-Prolene retention suture. A dressing  was placed. The patient tolerated the procedure well without immediate post procedural complication. IMPRESSION: Successful placement of a left internal jugular approach 24 cm temporary dialysis catheter with tip terminating with in the superior aspect of the right atrium. The catheter is ready for immediate use. PLAN: This catheter may be converted to a tunneled dialysis catheter at a later date as indicated. Ruthann Cancer, MD [facility] Radiology Electronically Signed   By: Ruthann Cancer MD   On: 03/11/2020 13:04   Medications:  . amLODipine  10 mg Oral Daily  . aspirin EC  81 mg Oral QHS  . atorvastatin  40 mg Oral Daily  . Chlorhexidine Gluconate Cloth  6 each Topical Daily  . ferric citrate  420 mg Oral TID with meals  . isosorbide mononitrate  30 mg Oral Daily  . metoprolol succinate  50 mg Oral Daily  . [START ON 03/14/2020] vancomycin  500 mg Intravenous Q M,W,F-HD

## 2020-03-13 NOTE — Plan of Care (Signed)
?  Problem: Elimination: ?Goal: Will not experience complications related to bowel motility ?Outcome: Progressing ?  ?Problem: Pain Managment: ?Goal: General experience of comfort will improve ?Outcome: Progressing ?  ?Problem: Safety: ?Goal: Ability to remain free from injury will improve ?Outcome: Progressing ?  ?

## 2020-03-14 LAB — RENAL FUNCTION PANEL
Albumin: 3.4 g/dL — ABNORMAL LOW (ref 3.5–5.0)
Anion gap: 12 (ref 5–15)
BUN: 57 mg/dL — ABNORMAL HIGH (ref 8–23)
CO2: 29 mmol/L (ref 22–32)
Calcium: 10.1 mg/dL (ref 8.9–10.3)
Chloride: 94 mmol/L — ABNORMAL LOW (ref 98–111)
Creatinine, Ser: 11.2 mg/dL — ABNORMAL HIGH (ref 0.61–1.24)
GFR, Estimated: 4 mL/min — ABNORMAL LOW (ref >60)
Glucose, Bld: 99 mg/dL (ref 70–99)
Phosphorus: 2.9 mg/dL (ref 2.5–4.6)
Potassium: 4.9 mmol/L (ref 3.5–5.1)
Sodium: 135 mmol/L (ref 135–145)

## 2020-03-14 LAB — CBC
HCT: 24.9 % — ABNORMAL LOW (ref 39.0–52.0)
Hemoglobin: 7.8 g/dL — ABNORMAL LOW (ref 13.0–17.0)
MCH: 30.4 pg (ref 26.0–34.0)
MCHC: 31.3 g/dL (ref 30.0–36.0)
MCV: 96.9 fL (ref 80.0–100.0)
Platelets: 123 10*3/uL — ABNORMAL LOW (ref 150–400)
RBC: 2.57 MIL/uL — ABNORMAL LOW (ref 4.22–5.81)
RDW: 17.1 % — ABNORMAL HIGH (ref 11.5–15.5)
WBC: 4.7 10*3/uL (ref 4.0–10.5)
nRBC: 0 % (ref 0.0–0.2)

## 2020-03-14 MED ORDER — ANTICOAGULANT SODIUM CITRATE 4% (200MG/5ML) IV SOLN
3.0000 mL | Status: DC | PRN
  Filled 2020-03-14: qty 5

## 2020-03-14 MED ORDER — ALTEPLASE 2 MG IJ SOLR: 3.0000 mg | Freq: Once | INTRAMUSCULAR | Status: AC

## 2020-03-14 MED ORDER — ALTEPLASE 2 MG IJ SOLR
INTRAMUSCULAR | Status: AC
  Administered 2020-03-14: 3 mg
  Filled 2020-03-14: qty 4

## 2020-03-14 MED ORDER — HEPARIN SODIUM (PORCINE) 1000 UNIT/ML IJ SOLN
INTRAMUSCULAR | Status: AC
  Filled 2020-03-14: qty 3

## 2020-03-14 NOTE — Significant Event (Addendum)
Rapid Response Event Note   Reason for Call :  12/10 chest pressure developed as he was completing his HD treatment.   Initial Focused Assessment:  Pt AAO sitting up in bed. He endorses 12/10 chest pressure and points to the center of his chest. He is able to speak in full sentences and moving all extremities. Extremities are warm, try. Color is pink. Pulses 1+. Pt states that this sensation of chest pressure has happened before when he has had too much fluid removed during his dialysis. Per HD RN, treatment was ended early and he was given back fluid. Approximately 500 cc removed during treatment, once fluid was returned.   VS: T 98.4, BP 156/52, HR 79, RR 18, SpO2 98% on room air  Interventions:  -12 lead EKG  Plan of Care:  -PRNs for pain- reassess for effectiveness -Follow-up with provider if additional studies are ordered  Call rapid response for additional needs  Event Summary:  MD Notified: Dr. Broadus John Call Time: [place] Time: [id number] End Time: [place], RN

## 2020-03-14 NOTE — H&P (View-Only) (Signed)
  Progress Note    03/14/2020 8:06 AM * No surgery found *  Subjective:  No complaints. Seen in HD. Temporary catheter   Vitals:   03/13/20 1950 03/14/20 0536  BP: (!) 150/56 (!) 145/62  Pulse: 82 79  Resp: 18 17  Temp: 98.2 F (36.8 C) 98.1 F (36.7 C)  SpO2: 99% 100%   Physical Exam: Cardiac: regular Lungs: non labored Extremities: right upper extremity fistula with good thrill. There is area of ulceration with dry eschar. No swelling or erythema surrounding this area    Neurologic: alert and oriented  CBC    Component Value Date/Time   WBC 4.7 03/14/2020 0340   RBC 2.57 (L) 03/14/2020 0340   HGB 7.8 (L) 03/14/2020 0340   HCT 24.9 (L) 03/14/2020 0340   PLT 123 (L) 03/14/2020 0340   MCV 96.9 03/14/2020 0340   MCH 30.4 03/14/2020 0340   MCHC 31.3 03/14/2020 0340   RDW 17.1 (H) 03/14/2020 0340   LYMPHSABS 1.5 03/10/2020 0144   MONOABS 0.5 03/10/2020 0144   EOSABS 0.5 03/10/2020 0144   BASOSABS 0.0 03/10/2020 0144    BMET    Component Value Date/Time   NA 138 03/12/2020 0350   K 4.5 03/12/2020 0350   CL 97 (L) 03/12/2020 0350   CO2 29 03/12/2020 0350   GLUCOSE 83 03/12/2020 0350   BUN 33 (H) 03/12/2020 0350   CREATININE 6.88 (H) 03/12/2020 0350   CREATININE 5.98 (H) 03/28/2017 1638   CALCIUM 9.8 03/12/2020 0350   GFRNONAA 8 (L) 03/12/2020 0350   GFRAA 9 (L) 03/04/2019 0840    INR    Component Value Date/Time   INR 1.1 03/10/2020 0144     Intake/Output Summary (Last 24 hours) at 03/14/2020 0806 Last data filed at 03/13/2020 1959 Gross per 24 hour  Intake 800 ml  Output 0 ml  Net 800 ml     Assessment/Plan:  64 y.o. male with fluid around fistula that was seen on duplex. Swelling is improved and no signs of infection. No leukocytosis. Afebrile. Temporary catheter not functioning this morning in HD. Does need TDC prior to discharge. Arm is looking better with rest. I will discuss with Dr. Donzetta Matters possible revision of the right arm fistula at time  of [facility] placement   Karoline Caldwell, PA-C [facility] [phone] 03/14/2020 8:06 AM   I have independently interviewed and examined patient and agree with PA assessment and plan above.  Plan for operative revision of right arm AV fistula and tunneled dialysis catheter placement tomorrow in the OR.  N.p.o. past midnight.  Deontrae Drinkard C. Donzetta Matters, MD [facility] [place] Office: [phone] Pager: [phone]

## 2020-03-14 NOTE — Progress Notes (Addendum)
Subjective: Seen on HD, temp cath TPA"s and working good now but pt near the end of HD is having chest pain, not responding to NS bolus. Taking him off a bit early.   Objective Vital signs in last 24 hours: Vitals:   03/14/20 1230 03/14/20 1300 03/14/20 1330 03/14/20 1400  BP: 104/86 (!) 153/61 (!) 141/67 128/69  Pulse: 84 78 77 78  Resp: (!) 21     Temp:      TempSrc:      SpO2:      Weight:      Height:       Weight change:   Physical Exam: General:Alert thin male, NAD Heart:RRR, no MGR Lungs:CTA, nonlabored breathing room air Abdomen:Bowel sounds positive normoactive, soft nontender nondistended, no ascites Extremities:No pedal edema Dialysis Access:Right upper extremity AV fistula positive bruit with swelling and nontender this a.m. no discharge appreciated Left IJ temporary catheter in place   OPDialysis Orders: Home HD NxStage MTTS EDW 50 kg  OP Labs 10/7: Hgb 9.8, K 7.1!, Ca 10.7, P 5.3, PTH 209   OP HD cultures sent from arm are growing Staph urealyticus, R to methicillin, S to vanc  Problem/Plan: 1. Infected RUE AVF versus hematoma- VVS  has evaluated  by Dr. Donzetta Matters as noted   On IV vancomycin day #4 blood cx  no growth to date . Arm is looking better. Wound Cx collected OP center 10/13= heavy growth corynebacterium species.  Resting use of AV fistula using temporary left neck HD cath.  Seen by VVS today, they may put in [facility] at time of AVF revision.  2. ESRD - HomeHD.  In hospital plan MWF, HD today via temporary left IJ. May have to go back to in-center HD with a TDC.  3. Hypertension/volume - BP proving when given home meds.  Metoprolol XL 50 mg and Imdur 30, . UF 1.4 L 10/15 on dialysis questionable weight with patient 7 kg below dry weight after dialysisChest x-ray shows no volume overload 4. Anemia - Hgb8.6 <8.2<8.1<8.4. Received Aranesp 200 at outpatient center 44/01. 5. Metabolic bone disease - Continue Auryixa binder. Hold calcitriol for  now d/t elevated Ca  Kelly Splinter, MD 03/14/2020, 2:30 PM      Labs: Basic Metabolic Panel: Recent Labs  Lab 03/11/20 0339 03/12/20 0350 03/14/20 0700  NA 135 138 135  K 4.7 4.5 4.9  CL 93* 97* 94*  CO2 28 29 29   GLUCOSE 94 83 99  BUN 72* 33* 57*  CREATININE 9.66* 6.88* 11.20*  CALCIUM 10.2 9.8 10.1  PHOS  --   --  2.9   Liver Function Tests: Recent Labs  Lab 03/10/20 0144 03/11/20 0339 03/14/20 0700  AST 18 15  --   ALT 11 12  --   ALKPHOS 73 62  --   BILITOT 0.7 0.7  --   PROT 7.1 6.4*  --   ALBUMIN 3.6 3.3* 3.4*   No results for input(s): LIPASE, AMYLASE in the last 168 hours. No results for input(s): AMMONIA in the last 168 hours. CBC: Recent Labs  Lab 03/10/20 0144 03/10/20 0144 03/11/20 0339 03/11/20 0339 03/11/20 1533 03/11/20 1533 03/12/20 0350 03/13/20 0411 03/14/20 0340  WBC 3.6*   < > 4.3   < > 5.0   < > 4.8 4.6 4.7  NEUTROABS 1.2*  --   --   --   --   --   --   --   --   HGB 8.4*   < >  8.1*   < > 9.1*   < > 8.2* 8.6* 7.8*  HCT 27.3*   < > 25.6*   < > 29.1*   < > 26.6* 27.9* 24.9*  MCV 99.3   < > 95.2  --  96.0  --  96.4 96.5 96.9  PLT 82*   < > 86*   < > 132*   < > 107* 106* 123*   < > = values in this interval not displayed.   Cardiac Enzymes: No results for input(s): CKTOTAL, CKMB, CKMBINDEX, TROPONINI in the last 168 hours. CBG: No results for input(s): GLUCAP in the last 168 hours.  Studies/Results: No results found. Medications:  . amLODipine  10 mg Oral Daily  . aspirin EC  81 mg Oral QHS  . atorvastatin  40 mg Oral Daily  . Chlorhexidine Gluconate Cloth  6 each Topical Daily  . ferric citrate  420 mg Oral TID with meals  . isosorbide mononitrate  30 mg Oral Daily  . metoprolol succinate  50 mg Oral Daily  . vancomycin  500 mg Intravenous Q M,W,F-HD

## 2020-03-14 NOTE — Progress Notes (Addendum)
PROGRESS NOTE    Logan Martin  ZYS:[id number] DOB: December 28, 1955 DOA: 03/10/2020 PCP: [name], NP  Brief Narrative:Logan Martin is a 64 y.o. male with medical history significant of ESRD on home hemodialysis (4 times per week-MTTF)), coronary artery disease, chronic thrombocytopenia, anemia of chronic disease, hyperlipidemia, chronic back pain, hepatitis C, PVD, bladder cancer presented  to emergency department for evaluation of right upper arm-AV fistula site, which was more swollen and had drainage, raising concern for infection, cultures were not obtained. -Patient is afebrile in the ED, vascular surgery and nephrology consulting,  Assessment & Plan:   Right arm AV fistula swelling and drainage -Infection versus hematoma, ultrasound with fluid collection, no fever or leukocytosis, blood cultures remain negative ,, patient reports chills and nausea vomiting few days prior to admission -Day 5 of IV vancomycin, per renal note, outpatient cultures with corynebacterium species -Nephrology and vascular surgery following, AV fistula being rested at this time -Temporary HD catheter placed in IR 10/15 -Per vascular , plan for [facility] +/- revision  ESRD on home hemodialysis -Last HD 10/12 -Per renal  Anemia of chronic disease -Hemoglobin slightly lower today, continue EPO with HD -CBC in a.m.  Hypertension -Continue metoprolol, Imdur  Chronic thrombocytopenia -Monitor  CAD/peripheral vascular disease -Continue aspirin, statin  Chronic lower back pain -On oxycodone at baseline  DVT prophylaxis: SCDs due to thrombocytopenia Code Status: Full code Family Communication: No family at bedside Disposition Plan:  Status is: Inpatient  Remains inpatient appropriate because:Inpatient level of care appropriate due to severity of illness   Dispo: The patient is from: Home              Anticipated d/c is to: Home              Anticipated d/c date is: > 3 days               Patient currently is not medically stable to d/c.  Consultants:   VVS, Renal, IR   Procedures:   Antimicrobials:    Subjective: -Seen on dialysis, no complaints today,  Objective: Vitals:   03/14/20 1230 03/14/20 1300 03/14/20 1330 03/14/20 1400  BP: 104/86 (!) 153/61 (!) 141/67 128/69  Pulse: 84 78 77 78  Resp: (!) 21     Temp:      TempSrc:      SpO2:      Weight:      Height:        Intake/Output Summary (Last 24 hours) at 03/14/2020 1427 Last data filed at 03/14/2020 1150 Gross per 24 hour  Intake 1140 ml  Output 0 ml  Net 1140 ml   Filed Weights   03/12/20 0500 03/12/20 2038 03/14/20 1150  Weight: 42.8 kg 43.5 kg 47.7 kg    Examination:  General: Pleasant male, seen on dialysis, sitting up in bed, AAOx3, no distress CVS: S1-S2, regular rate rhythm  Lungs: Clear bilaterally Abdomen: Soft, nontender, bowel sounds present Extremities: No edema, right arm AV fistula with large scab, no discharge Psychiatry:Mood & affect appropriate.     Data Reviewed:   CBC: Recent Labs  Lab 03/10/20 0144 03/10/20 0144 03/11/20 0339 03/11/20 1533 03/12/20 0350 03/13/20 0411 03/14/20 0340  WBC 3.6*   < > 4.3 5.0 4.8 4.6 4.7  NEUTROABS 1.2*  --   --   --   --   --   --   HGB 8.4*   < > 8.1* 9.1* 8.2* 8.6* 7.8*  HCT 27.3*   < >  25.6* 29.1* 26.6* 27.9* 24.9*  MCV 99.3   < > 95.2 96.0 96.4 96.5 96.9  PLT 82*   < > 86* 132* 107* 106* 123*   < > = values in this interval not displayed.   Basic Metabolic Panel: Recent Labs  Lab 03/10/20 0144 03/10/20 1741 03/11/20 0339 03/12/20 0350 03/14/20 0700  NA 138  --  135 138 135  K 4.4  --  4.7 4.5 4.9  CL 92*  --  93* 97* 94*  CO2 32  --  28 29 29   GLUCOSE 90  --  94 83 99  BUN 57*  --  72* 33* 57*  CREATININE 7.46*  --  9.66* 6.88* 11.20*  CALCIUM 10.3  --  10.2 9.8 10.1  MG 2.1 1.9  --   --   --   PHOS  --   --   --   --  2.9   GFR: Estimated Creatinine Clearance: 4.5 mL/min (A) (by C-G formula based on  SCr of 11.2 mg/dL (H)). Liver Function Tests: Recent Labs  Lab 03/10/20 0144 03/11/20 0339 03/14/20 0700  AST 18 15  --   ALT 11 12  --   ALKPHOS 73 62  --   BILITOT 0.7 0.7  --   PROT 7.1 6.4*  --   ALBUMIN 3.6 3.3* 3.4*   No results for input(s): LIPASE, AMYLASE in the last 168 hours. No results for input(s): AMMONIA in the last 168 hours. Coagulation Profile: Recent Labs  Lab 03/10/20 0144  INR 1.1   Cardiac Enzymes: No results for input(s): CKTOTAL, CKMB, CKMBINDEX, TROPONINI in the last 168 hours. BNP (last 3 results) No results for input(s): PROBNP in the last 8760 hours. HbA1C: No results for input(s): HGBA1C in the last 72 hours. CBG: No results for input(s): GLUCAP in the last 168 hours. Lipid Profile: No results for input(s): CHOL, HDL, LDLCALC, TRIG, CHOLHDL, LDLDIRECT in the last 72 hours. Thyroid Function Tests: No results for input(s): TSH, T4TOTAL, FREET4, T3FREE, THYROIDAB in the last 72 hours. Anemia Panel: No results for input(s): VITAMINB12, FOLATE, FERRITIN, TIBC, IRON, RETICCTPCT in the last 72 hours. Urine analysis:    Component Value Date/Time   COLORURINE STRAW (A) 03/28/2018 0300   APPEARANCEUR CLEAR 03/28/2018 0300   LABSPEC 1.000 (L) 03/28/2018 0300   PHURINE 6.0 03/28/2018 0300   GLUCOSEU NEGATIVE 03/28/2018 0300   HGBUR NEGATIVE 03/28/2018 0300   BILIRUBINUR NEGATIVE 03/28/2018 0300   KETONESUR NEGATIVE 03/28/2018 0300   PROTEINUR NEGATIVE 03/28/2018 0300   NITRITE NEGATIVE 03/28/2018 0300   LEUKOCYTESUR NEGATIVE 03/28/2018 0300   Sepsis Labs: @LABRCNTIP (procalcitonin:4,lacticidven:4)  ) Recent Results (from the past 240 hour(s))  Culture, blood (Routine x 2)     Status: None (Preliminary result)   Collection Time: 03/10/20  1:30 AM   Specimen: BLOOD LEFT ARM  Result Value Ref Range Status   Specimen Description BLOOD LEFT ARM  Final   Special Requests   Final    BOTTLES DRAWN AEROBIC AND ANAEROBIC Blood Culture adequate  volume   Culture   Final    NO GROWTH 4 DAYS Performed at [facility], [place] [address], [place],  [address]    Report Status PENDING  Incomplete  Culture, blood (Routine x 2)     Status: None (Preliminary result)   Collection Time: 03/10/20  1:46 AM   Specimen: BLOOD LEFT HAND  Result Value Ref Range Status   Specimen Description BLOOD LEFT HAND  Final  Special Requests   Final    AEROBIC BOTTLE ONLY Blood Culture results may not be optimal due to an excessive volume of blood received in culture bottles   Culture   Final    NO GROWTH 4 DAYS Performed at [facility], [place] [address], [place], [place] [address]    Report Status PENDING  Incomplete  Respiratory Panel by RT PCR (Flu A&B, Covid) - Nasopharyngeal Swab     Status: None   Collection Time: 03/10/20 11:41 AM   Specimen: Nasopharyngeal Swab  Result Value Ref Range Status   SARS Coronavirus 2 by RT PCR NEGATIVE NEGATIVE Final    Comment: (NOTE) SARS-CoV-2 target nucleic acids are NOT DETECTED.  The SARS-CoV-2 RNA is generally detectable in upper respiratoy specimens during the acute phase of infection. The lowest concentration of SARS-CoV-2 viral copies this assay can detect is 131 copies/mL. A negative result does not preclude SARS-Cov-2 infection and should not be used as the sole basis for treatment or other patient management decisions. A negative result may occur with  improper specimen collection/handling, submission of specimen other than nasopharyngeal swab, presence of viral mutation(s) within the areas targeted by this assay, and inadequate number of viral copies (<131 copies/mL). A negative result must be combined with clinical observations, patient history, and epidemiological information. The expected result is Negative.  Fact Sheet for Patients:  PinkCheek.be  Fact Sheet for Healthcare Providers:  GravelBags.it  This test is no  t yet approved or cleared by the [place] FDA and  has been authorized for detection and/or diagnosis of SARS-CoV-2 by FDA under an Emergency Use Authorization (EUA). This EUA will remain  in effect (meaning this test can be used) for the duration of the COVID-19 declaration under Section 564(b)(1) of the Act, 21 U.S.C. section 360bbb-3(b)(1), unless the authorization is terminated or revoked sooner.     Influenza A by PCR NEGATIVE NEGATIVE Final   Influenza B by PCR NEGATIVE NEGATIVE Final    Comment: (NOTE) The Xpert Xpress SARS-CoV-2/FLU/RSV assay is intended as an aid in  the diagnosis of influenza from Nasopharyngeal swab specimens and  should not be used as a sole basis for treatment. Nasal washings and  aspirates are unacceptable for Xpert Xpress SARS-CoV-2/FLU/RSV  testing.  Fact Sheet for Patients: PinkCheek.be  Fact Sheet for Healthcare Providers: GravelBags.it  This test is not yet approved or cleared by the [place] FDA and  has been authorized for detection and/or diagnosis of SARS-CoV-2 by  FDA under an Emergency Use Authorization (EUA). This EUA will remain  in effect (meaning this test can be used) for the duration of the  Covid-19 declaration under Section 564(b)(1) of the Act, 21  U.S.C. section 360bbb-3(b)(1), unless the authorization is  terminated or revoked. Performed at [facility], [place] [address] [place], [place] [address]     Radiology Studies: No results found. Scheduled Meds: . amLODipine  10 mg Oral Daily  . aspirin EC  81 mg Oral QHS  . atorvastatin  40 mg Oral Daily  . Chlorhexidine Gluconate Cloth  6 each Topical Daily  . ferric citrate  420 mg Oral TID with meals  . isosorbide mononitrate  30 mg Oral Daily  . metoprolol succinate  50 mg Oral Daily  . vancomycin  500 mg Intravenous Q M,W,F-HD   Continuous Infusions:   LOS: 4 days   Time spent: 64min  Domenic Polite, MD [facility]  03/14/2020, 2:27 PM

## 2020-03-14 NOTE — Progress Notes (Addendum)
   03/14/20 1512  Hand-Off documentation  Handoff Given Given to shift RN/LPN  Report given to (Full Name) [facility] Received Received from Transfer Unit/facility  Report received from (Full Name) Denny Peon, RN  Pain Assessment  Pain Scale 0-10  Pain Score 10  Pain Type Acute pain  Pain Location Chest  Pain Orientation Upper;Mid  Pain Descriptors / Indicators Pressure  Pain Frequency Constant  Pain Onset Progressive  Patients Stated Pain Goal 2  Pain Intervention(s) MD notified (Comment)  Multiple Pain Sites No  Hemodialysis Catheter Right Internal jugular Triple lumen Temporary (Non-Tunneled)  Placement Date/Time: 03/11/20 1217   Placed prior to admission: No  Time Out: Correct patient;Correct site;Correct procedure  Maximum sterile barrier precautions: Hand hygiene;Cap;Mask;Sterile gown;Sterile gloves;Large sterile sheet  Site Prep: Chlorh...  Site Condition No complications  Blue Lumen Status Flushed;Capped (Central line)  Red Lumen Status Flushed;Capped (Central line)  Purple Lumen Status Capped (Central line)  Catheter fill solution 4% Sodium Citrate  Catheter fill volume (Arterial) 1.5 cc  Catheter fill volume (Venous) 1.5  Dressing Status Clean;Dry;Intact  Interventions Dressing changed  Drainage Description None  Dressing Change Due 03/21/20  Post treatment catheter status Capped and Clamped  HD tx terminated with 1.5 hours left per pt request. Pt c/o chest pain, describes as "pressure", rates "12/10". Pt reports that he has chest pains at times during dialysis when too much fluid has been pulled off. Pt is normally a home HD patient. Dr. Jonnie Finner and Ernestina Penna on unit to assess pt and instructs to bolus NS 200 ml, stop UF and continue to dialyze if pt can tolerate. NS 400 ml given in total (not including rinseback), no effectiveness noted. Pt continues to c/o pain, per Dr. Jonnie Finner terminate tx. Post tx pt continues to report pain, Dr. Broadus John paged, no response noted.  Rapid response nurse notified and came to unit to assess pt. EKG performed, NSR noted. Vital signs stable. Per rapid response, give pt tylenol for pain and pt's primary nurse can followup and she will followup with Dr. Broadus John. Unable to administer vancomycin,  primary nurse, Candice made aware. Pt declined tylenol, states he needs something stronger.

## 2020-03-14 NOTE — Progress Notes (Addendum)
  Progress Note    03/14/2020 8:06 AM * No surgery found *  Subjective:  No complaints. Seen in HD. Temporary catheter   Vitals:   03/13/20 1950 03/14/20 0536  BP: (!) 150/56 (!) 145/62  Pulse: 82 79  Resp: 18 17  Temp: 98.2 F (36.8 C) 98.1 F (36.7 C)  SpO2: 99% 100%   Physical Exam: Cardiac: regular Lungs: non labored Extremities: right upper extremity fistula with good thrill. There is area of ulceration with dry eschar. No swelling or erythema surrounding this area    Neurologic: alert and oriented  CBC    Component Value Date/Time   WBC 4.7 03/14/2020 0340   RBC 2.57 (L) 03/14/2020 0340   HGB 7.8 (L) 03/14/2020 0340   HCT 24.9 (L) 03/14/2020 0340   PLT 123 (L) 03/14/2020 0340   MCV 96.9 03/14/2020 0340   MCH 30.4 03/14/2020 0340   MCHC 31.3 03/14/2020 0340   RDW 17.1 (H) 03/14/2020 0340   LYMPHSABS 1.5 03/10/2020 0144   MONOABS 0.5 03/10/2020 0144   EOSABS 0.5 03/10/2020 0144   BASOSABS 0.0 03/10/2020 0144    BMET    Component Value Date/Time   NA 138 03/12/2020 0350   K 4.5 03/12/2020 0350   CL 97 (L) 03/12/2020 0350   CO2 29 03/12/2020 0350   GLUCOSE 83 03/12/2020 0350   BUN 33 (H) 03/12/2020 0350   CREATININE 6.88 (H) 03/12/2020 0350   CREATININE 5.98 (H) 03/28/2017 1638   CALCIUM 9.8 03/12/2020 0350   GFRNONAA 8 (L) 03/12/2020 0350   GFRAA 9 (L) 03/04/2019 0840    INR    Component Value Date/Time   INR 1.1 03/10/2020 0144     Intake/Output Summary (Last 24 hours) at 03/14/2020 0806 Last data filed at 03/13/2020 1959 Gross per 24 hour  Intake 800 ml  Output 0 ml  Net 800 ml     Assessment/Plan:  64 y.o. male with fluid around fistula that was seen on duplex. Swelling is improved and no signs of infection. No leukocytosis. Afebrile. Temporary catheter not functioning this morning in HD. Does need TDC prior to discharge. Arm is looking better with rest. I will discuss with Dr. Donzetta Matters possible revision of the right arm fistula at time  of [facility] placement   Karoline Caldwell, PA-C [facility] [phone] 03/14/2020 8:06 AM   I have independently interviewed and examined patient and agree with PA assessment and plan above.  Plan for operative revision of right arm AV fistula and tunneled dialysis catheter placement tomorrow in the OR.  N.p.o. past midnight.  Candido Flott C. Donzetta Matters, MD [facility] [place] Office: [phone] Pager: [phone]

## 2020-03-15 ENCOUNTER — Inpatient Hospital Stay (HOSPITAL_COMMUNITY): Payer: Medicare Other

## 2020-03-15 ENCOUNTER — Encounter (HOSPITAL_COMMUNITY): Payer: Self-pay | Admitting: Internal Medicine

## 2020-03-15 ENCOUNTER — Inpatient Hospital Stay (HOSPITAL_COMMUNITY): Payer: Medicare Other | Admitting: Certified Registered Nurse Anesthetist

## 2020-03-15 ENCOUNTER — Encounter (HOSPITAL_COMMUNITY)
Admission: EM | Disposition: A | Discharge status: Home / Self Care | Payer: Self-pay | Source: Home / Self Care | Attending: Internal Medicine

## 2020-03-15 HISTORY — PX: REVISON OF ARTERIOVENOUS FISTULA: SHX6074

## 2020-03-15 HISTORY — PX: INSERTION OF DIALYSIS CATHETER: SHX1324

## 2020-03-15 LAB — POCT I-STAT, CHEM 8
BUN: 31 mg/dL — ABNORMAL HIGH (ref 8–23)
Calcium, Ion: 1.08 mmol/L — ABNORMAL LOW (ref 1.15–1.40)
Chloride: 97 mmol/L — ABNORMAL LOW (ref 98–111)
Creatinine, Ser: 8.5 mg/dL — ABNORMAL HIGH (ref 0.61–1.24)
Glucose, Bld: 87 mg/dL (ref 70–99)
HCT: 25 % — ABNORMAL LOW (ref 39.0–52.0)
Hemoglobin: 8.5 g/dL — ABNORMAL LOW (ref 13.0–17.0)
Potassium: 4.6 mmol/L (ref 3.5–5.1)
Sodium: 136 mmol/L (ref 135–145)
TCO2: 29 mmol/L (ref 22–32)

## 2020-03-15 LAB — CBC
HCT: 23.8 % — ABNORMAL LOW (ref 39.0–52.0)
Hemoglobin: 7.3 g/dL — ABNORMAL LOW (ref 13.0–17.0)
MCH: 30.2 pg (ref 26.0–34.0)
MCHC: 30.7 g/dL (ref 30.0–36.0)
MCV: 98.3 fL (ref 80.0–100.0)
Platelets: 110 10*3/uL — ABNORMAL LOW (ref 150–400)
RBC: 2.42 MIL/uL — ABNORMAL LOW (ref 4.22–5.81)
RDW: 17.3 % — ABNORMAL HIGH (ref 11.5–15.5)
WBC: 4.5 10*3/uL (ref 4.0–10.5)
nRBC: 0 % (ref 0.0–0.2)

## 2020-03-15 LAB — POCT I-STAT EG7
Acid-base deficit: 1 mmol/L (ref 0.0–2.0)
Bicarbonate: 25.4 mmol/L (ref 20.0–28.0)
Calcium, Ion: 1.06 mmol/L — ABNORMAL LOW (ref 1.15–1.40)
HCT: 20 % — ABNORMAL LOW (ref 39.0–52.0)
Hemoglobin: 6.8 g/dL — CL (ref 13.0–17.0)
O2 Saturation: 57 %
Potassium: 4.7 mmol/L (ref 3.5–5.1)
Sodium: 141 mmol/L (ref 135–145)
TCO2: 27 mmol/L (ref 22–32)
pCO2, Ven: 55.1 mmHg (ref 44.0–60.0)
pH, Ven: 7.273 (ref 7.250–7.430)
pO2, Ven: 34 mmHg (ref 32.0–45.0)

## 2020-03-15 LAB — CULTURE, BLOOD (ROUTINE X 2)
Culture: NO GROWTH
Culture: NO GROWTH
Special Requests: ADEQUATE

## 2020-03-15 LAB — SURGICAL PCR SCREEN
MRSA, PCR: NEGATIVE
Staphylococcus aureus: NEGATIVE

## 2020-03-15 LAB — PREPARE RBC (CROSSMATCH)

## 2020-03-15 SURGERY — REVISON OF ARTERIOVENOUS FISTULA
Anesthesia: General | Site: Neck | Laterality: Right

## 2020-03-15 MED ORDER — VASOPRESSIN 20 UNIT/ML IV SOLN
INTRAVENOUS | Status: DC | PRN
  Administered 2020-03-15: 1 [IU] via INTRAVENOUS

## 2020-03-15 MED ORDER — ORAL CARE MOUTH RINSE: 15.0000 mL | Freq: Once | OROMUCOSAL | Status: AC

## 2020-03-15 MED ORDER — 0.9 % SODIUM CHLORIDE (POUR BTL) OPTIME
TOPICAL | Status: DC | PRN
  Administered 2020-03-15: 1000 mL

## 2020-03-15 MED ORDER — CHLORHEXIDINE GLUCONATE 0.12 % MT SOLN: 15.0000 mL | Freq: Once | OROMUCOSAL | Status: AC

## 2020-03-15 MED ORDER — PHENYLEPHRINE HCL-NACL 10-0.9 MG/250ML-% IV SOLN
INTRAVENOUS | Status: DC | PRN
  Administered 2020-03-15: 100 ug/min via INTRAVENOUS

## 2020-03-15 MED ORDER — OXYCODONE HCL 5 MG PO TABS: 5.0000 mg | ORAL_TABLET | Freq: Once | ORAL | Status: DC | PRN

## 2020-03-15 MED ORDER — LIDOCAINE-EPINEPHRINE 0.5 %-1:200000 IJ SOLN
INTRAMUSCULAR | Status: DC | PRN
  Administered 2020-03-15: 50 mL

## 2020-03-15 MED ORDER — LIDOCAINE 2% (20 MG/ML) 5 ML SYRINGE
INTRAMUSCULAR | Status: DC | PRN
  Administered 2020-03-15: 60 mg via INTRAVENOUS

## 2020-03-15 MED ORDER — SODIUM CHLORIDE 0.9% IV SOLUTION: Freq: Once | INTRAVENOUS | Status: DC

## 2020-03-15 MED ORDER — PHENYLEPHRINE 40 MCG/ML (10ML) SYRINGE FOR IV PUSH (FOR BLOOD PRESSURE SUPPORT)
PREFILLED_SYRINGE | INTRAVENOUS | Status: AC
  Filled 2020-03-15: qty 40

## 2020-03-15 MED ORDER — ONDANSETRON HCL 4 MG/2ML IJ SOLN
INTRAMUSCULAR | Status: AC
  Filled 2020-03-15: qty 2

## 2020-03-15 MED ORDER — PROTAMINE SULFATE 10 MG/ML IV SOLN
INTRAVENOUS | Status: AC
  Filled 2020-03-15: qty 15

## 2020-03-15 MED ORDER — ONDANSETRON HCL 4 MG/2ML IJ SOLN: 4.0000 mg | Freq: Once | INTRAMUSCULAR | Status: DC | PRN

## 2020-03-15 MED ORDER — HEPARIN SODIUM (PORCINE) 1000 UNIT/ML IJ SOLN
INTRAMUSCULAR | Status: AC
  Filled 2020-03-15: qty 1

## 2020-03-15 MED ORDER — HYDROMORPHONE HCL 1 MG/ML IJ SOLN: 0.2500 mg | INTRAMUSCULAR | Status: DC | PRN

## 2020-03-15 MED ORDER — ANTICOAGULANT SODIUM CITRATE 4% (200MG/5ML) IV SOLN: Status: DC | PRN

## 2020-03-15 MED ORDER — VASOPRESSIN 20 UNIT/ML IV SOLN
INTRAVENOUS | Status: AC
  Filled 2020-03-15: qty 1

## 2020-03-15 MED ORDER — OXYCODONE HCL 5 MG/5ML PO SOLN: 5.0000 mg | Freq: Once | ORAL | Status: DC | PRN

## 2020-03-15 MED ORDER — DEXAMETHASONE SODIUM PHOSPHATE 10 MG/ML IJ SOLN
INTRAMUSCULAR | Status: DC | PRN
  Administered 2020-03-15: 5 mg via INTRAVENOUS

## 2020-03-15 MED ORDER — OXYCODONE HCL 5 MG PO TABS
5.0000 mg | ORAL_TABLET | ORAL | Status: DC | PRN
  Administered 2020-03-16: 10 mg via ORAL
  Filled 2020-03-15: qty 2

## 2020-03-15 MED ORDER — EPHEDRINE SULFATE-NACL 50-0.9 MG/10ML-% IV SOSY
PREFILLED_SYRINGE | INTRAVENOUS | Status: DC | PRN
  Administered 2020-03-15: 10 mg via INTRAVENOUS

## 2020-03-15 MED ORDER — DEXAMETHASONE SODIUM PHOSPHATE 10 MG/ML IJ SOLN
INTRAMUSCULAR | Status: AC
  Filled 2020-03-15: qty 1

## 2020-03-15 MED ORDER — SODIUM CHLORIDE 0.9 % IR SOLN
Status: DC | PRN
  Administered 2020-03-15: 1000 mL

## 2020-03-15 MED ORDER — SODIUM CHLORIDE 0.9 % IV SOLN: INTRAVENOUS | Status: DC

## 2020-03-15 MED ORDER — SUGAMMADEX SODIUM 200 MG/2ML IV SOLN
INTRAVENOUS | Status: DC | PRN
  Administered 2020-03-15: 200 mg via INTRAVENOUS

## 2020-03-15 MED ORDER — PHENYLEPHRINE 40 MCG/ML (10ML) SYRINGE FOR IV PUSH (FOR BLOOD PRESSURE SUPPORT)
PREFILLED_SYRINGE | INTRAVENOUS | Status: DC | PRN
  Administered 2020-03-15: 120 ug via INTRAVENOUS
  Administered 2020-03-15: 40 ug via INTRAVENOUS
  Administered 2020-03-15: 80 ug via INTRAVENOUS
  Administered 2020-03-15: 120 ug via INTRAVENOUS
  Administered 2020-03-15 (×3): 80 ug via INTRAVENOUS
  Administered 2020-03-15: 120 ug via INTRAVENOUS
  Administered 2020-03-15 (×3): 80 ug via INTRAVENOUS

## 2020-03-15 MED ORDER — LIDOCAINE-EPINEPHRINE 0.5 %-1:200000 IJ SOLN
INTRAMUSCULAR | Status: AC
  Filled 2020-03-15: qty 1

## 2020-03-15 MED ORDER — ONDANSETRON HCL 4 MG/2ML IJ SOLN
INTRAMUSCULAR | Status: DC | PRN
  Administered 2020-03-15: 4 mg via INTRAVENOUS

## 2020-03-15 MED ORDER — FENTANYL CITRATE (PF) 250 MCG/5ML IJ SOLN
INTRAMUSCULAR | Status: DC | PRN
Start: 2020-03-15 — End: 2020-03-15
  Administered 2020-03-15 (×3): 25 ug via INTRAVENOUS
  Administered 2020-03-15: 50 ug via INTRAVENOUS

## 2020-03-15 MED ORDER — ALBUMIN HUMAN 5 % IV SOLN: INTRAVENOUS | Status: DC | PRN

## 2020-03-15 MED ORDER — CHLORHEXIDINE GLUCONATE 0.12 % MT SOLN
OROMUCOSAL | Status: AC
  Administered 2020-03-15: 15 mL via OROMUCOSAL
  Filled 2020-03-15: qty 15

## 2020-03-15 MED ORDER — LIDOCAINE 2% (20 MG/ML) 5 ML SYRINGE
INTRAMUSCULAR | Status: AC
  Filled 2020-03-15: qty 5

## 2020-03-15 MED ORDER — ROCURONIUM BROMIDE 10 MG/ML (PF) SYRINGE
PREFILLED_SYRINGE | INTRAVENOUS | Status: DC | PRN
  Administered 2020-03-15: 60 mg via INTRAVENOUS
  Administered 2020-03-15: 10 mg via INTRAVENOUS

## 2020-03-15 MED ORDER — PROPOFOL 10 MG/ML IV BOLUS
INTRAVENOUS | Status: DC | PRN
  Administered 2020-03-15: 20 mg via INTRAVENOUS
  Administered 2020-03-15: 150 mg via INTRAVENOUS

## 2020-03-15 MED ORDER — FENTANYL CITRATE (PF) 250 MCG/5ML IJ SOLN
INTRAMUSCULAR | Status: AC
  Filled 2020-03-15: qty 5

## 2020-03-15 SURGICAL SUPPLY — 68 items
ARMBAND PINK RESTRICT EXTREMIT (MISCELLANEOUS) ×3 IMPLANT
BAG DECANTER FOR FLEXI CONT (MISCELLANEOUS) ×3 IMPLANT
BIOPATCH RED 1 DISK 7.0 (GAUZE/BANDAGES/DRESSINGS) ×3 IMPLANT
BNDG ELASTIC 4X5.8 VLCR STR LF (GAUZE/BANDAGES/DRESSINGS) ×1 IMPLANT
BNDG GAUZE ELAST 4 BULKY (GAUZE/BANDAGES/DRESSINGS) ×1 IMPLANT
CANISTER SUCT 3000ML PPV (MISCELLANEOUS) ×3 IMPLANT
CANNULA VESSEL 3MM 2 BLNT TIP (CANNULA) ×3 IMPLANT
CATH BEACON 5 .035 40 KMP TP (CATHETERS) IMPLANT
CATH BEACON 5 .038 40 KMP TP (CATHETERS) ×1
CATH PALINDROME-P 19CM W/VT (CATHETERS) IMPLANT
CATH PALINDROME-P 23CM W/VT (CATHETERS) ×2 IMPLANT
CATH PALINDROME-P 28CM W/VT (CATHETERS) ×1 IMPLANT
CLIP LIGATING EXTRA MED SLVR (CLIP) ×3 IMPLANT
CLIP LIGATING EXTRA SM BLUE (MISCELLANEOUS) ×3 IMPLANT
COVER PROBE W GEL 5X96 (DRAPES) ×3 IMPLANT
COVER SURGICAL LIGHT HANDLE (MISCELLANEOUS) ×3 IMPLANT
COVER WAND RF STERILE (DRAPES) ×2 IMPLANT
DECANTER SPIKE VIAL GLASS SM (MISCELLANEOUS) ×3 IMPLANT
DERMABOND ADVANCED (GAUZE/BANDAGES/DRESSINGS) ×2
DERMABOND ADVANCED .7 DNX12 (GAUZE/BANDAGES/DRESSINGS) ×2 IMPLANT
DEVICE TORQUE KENDALL .025-038 (MISCELLANEOUS) ×1 IMPLANT
DRAPE C-ARM 42X72 X-RAY (DRAPES) ×3 IMPLANT
DRAPE CHEST BREAST 15X10 FENES (DRAPES) ×3 IMPLANT
DRAPE HALF SHEET 40X57 (DRAPES) ×2 IMPLANT
DRAPE ORTHO SPLIT 77X108 STRL (DRAPES) ×1
DRAPE SURG ORHT 6 SPLT 77X108 (DRAPES) IMPLANT
DRSG COVADERM 4X10 (GAUZE/BANDAGES/DRESSINGS) ×1 IMPLANT
ELECT REM PT RETURN 9FT ADLT (ELECTROSURGICAL) ×3
ELECTRODE REM PT RTRN 9FT ADLT (ELECTROSURGICAL) ×2 IMPLANT
GAUZE 4X4 16PLY RFD (DISPOSABLE) ×3 IMPLANT
GLOVE BIO SURGEON STRL SZ7.5 (GLOVE) ×1 IMPLANT
GLOVE SS BIOGEL STRL SZ 7.5 (GLOVE) ×2 IMPLANT
GLOVE SUPERSENSE BIOGEL SZ 7.5 (GLOVE) ×2
GLOVE SURG SS PI 6.5 STRL IVOR (GLOVE) ×1 IMPLANT
GLOVE SURG SYN 8.0 (GLOVE) ×3 IMPLANT
GLOVE SURG SYN 8.0 PF PI (GLOVE) IMPLANT
GOWN STRL REUS W/ TWL LRG LVL3 (GOWN DISPOSABLE) ×6 IMPLANT
GOWN STRL REUS W/TWL LRG LVL3 (GOWN DISPOSABLE) ×3
GUIDEWIRE ANGLED .035X150CM (WIRE) ×1 IMPLANT
KIT BASIN OR (CUSTOM PROCEDURE TRAY) ×3 IMPLANT
KIT PALINDROME-P 55CM (CATHETERS) IMPLANT
KIT TURNOVER KIT B (KITS) ×3 IMPLANT
NDL 18GX1X1/2 (RX/OR ONLY) (NEEDLE) ×2 IMPLANT
NDL HYPO 25GX1X1/2 BEV (NEEDLE) ×2 IMPLANT
NEEDLE 18GX1X1/2 (RX/OR ONLY) (NEEDLE) ×3 IMPLANT
NEEDLE 22X1 1/2 (OR ONLY) (NEEDLE) IMPLANT
NEEDLE HYPO 25GX1X1/2 BEV (NEEDLE) ×3 IMPLANT
NS IRRIG 1000ML POUR BTL (IV SOLUTION) ×3 IMPLANT
PACK CV ACCESS (CUSTOM PROCEDURE TRAY) ×3 IMPLANT
PACK SURGICAL SETUP 50X90 (CUSTOM PROCEDURE TRAY) ×3 IMPLANT
PAD ARMBOARD 7.5X6 YLW CONV (MISCELLANEOUS) ×6 IMPLANT
SOAP 2 % CHG 4 OZ (WOUND CARE) ×2 IMPLANT
SPONGE LAP 18X18 RF (DISPOSABLE) ×1 IMPLANT
SUT ETHILON 3 0 PS 1 (SUTURE) ×3 IMPLANT
SUT PROLENE 5 0 C 1 24 (SUTURE) ×2 IMPLANT
SUT PROLENE 5 0 C 1 36 (SUTURE) ×1 IMPLANT
SUT PROLENE 6 0 CC (SUTURE) ×3 IMPLANT
SUT VIC AB 3-0 SH 27 (SUTURE) ×1
SUT VIC AB 3-0 SH 27X BRD (SUTURE) ×2 IMPLANT
SUT VICRYL 4-0 PS2 18IN ABS (SUTURE) ×4 IMPLANT
SYR 10ML LL (SYRINGE) ×3 IMPLANT
SYR 20ML LL LF (SYRINGE) ×3 IMPLANT
SYR 5ML LL (SYRINGE) ×6 IMPLANT
SYR CONTROL 10ML LL (SYRINGE) ×3 IMPLANT
TOWEL GREEN STERILE (TOWEL DISPOSABLE) ×6 IMPLANT
TOWEL GREEN STERILE FF (TOWEL DISPOSABLE) ×3 IMPLANT
UNDERPAD 30X36 HEAVY ABSORB (UNDERPADS AND DIAPERS) ×3 IMPLANT
WATER STERILE IRR 1000ML POUR (IV SOLUTION) ×3 IMPLANT

## 2020-03-15 NOTE — Op Note (Signed)
OPERATIVE REPORT  DATE OF SURGERY: 03/15/2020  PATIENT: Logan Martin, 64 y.o. male MRN: [id number]  DOB: 1955/06/29  PRE-OPERATIVE DIAGNOSIS: End-stage renal disease  POST-OPERATIVE DIAGNOSIS:  Same  PROCEDURE: #1 left IJ tunneled hemodialysis catheter, #2 revision of right upper arm AV fistula with excision of ulcerated area and plication of fistula  SURGEON:  Curt Jews, M.D.  PHYSICIAN ASSISTANT: Arlee Muslim, PA-C  The assistant was needed for exposure and to expedite the case  ANESTHESIA: General  EBL: per anesthesia record  Total I/O In: 1850 [I.V.:1100; IV Piggyback:750] Out: 2150 [Blood:2150]  BLOOD ADMINISTERED: none  DRAINS: none  SPECIMEN: none  COUNTS CORRECT:  YES  PATIENT DISPOSITION:  PACU - hemodynamically stable  PROCEDURE DETAILS: Patient was taken up and placed supine position where the area of the left and right neck and chest were prepped and draped you sterile fashion and the right arm was prepped in usual sterile fashion. Patient had a temporary catheter in the left internal jugular vein. The catheter was prepped as well. A guidewire was passed through the existing catheter and the catheter was removed. The patient had on fluoroscopy a right subclavian stent that appeared to come into the innominate artery. The J-wire had a difficult time passing the stent. Initially the peel-away sheath was passed over the guidewire and I could not get the J-wire to go into the right atrium. I placed a Kumpe catheter over the guidewire to attempt to direct this down and this was not successful. There was a great deal of bleeding around the sheath since the hemostatic valve did not work with a Kumpe catheter in place. I removed the sheath and held direct pressure for mobilization. There continues to be a great deal of blood loss from this. A new peel-away sheath was brought onto the field and was passed over the guidewire after removal of the Kumpe catheter. The  Kumpe catheter was then replaced there was still a significant blood loss through this which was cold controlled with digital pressure. A angled Glidewire was then used with the Kumpe catheter allow passage into the right atrium. A 23 cm tunnel catheter was brought through a separate stab and was placed over the guidewire. Again there was very poor hemostasis with these maneuvers. The catheter continue to hold up on the stent that was placed in the right subclavian. The 23 cm catheter was seated but I was not pleased with the distance distally and felt that there would be difficulty particularly with the stent present. For this reason I elected to place a 28 cm. The 23 cm catheter was cut with heavy scissors and a guidewire was passed through this and the 23 cm catheter was removed in its entirety. A peel-away sheath was passed over this guidewire at the IJ entry site and the 28 cm catheter was brought through a separate stab incision at the left subclavian region. The weave technique with the guidewire through the catheter was used to pass the 28 cm catheter distally. This did go to the level of the right atrium. The peel-away sheath and guidewire were removed. Both lumens flushed and aspirated easily. The patient has a heparin. The entry site was closed with a 4 subcuticular Vicryl stitch and the entry site was closed with a 3-0 nylon stitch which was also used to secure the catheter to the skin. Attention was then turned to the right arm. The patient had a very longstanding fistula that has been revised multiple times  in the past. There was an ulcerated area that appeared to be a risk for rupture. I made a large ellipse over this removing all devitalized skin. The shoulder was encircled circumferentially proximal and distal to this area. There was approximately 3 to 4 cm of ellipse of skin. The and was removed over this aneurysmal segment. The vein was plicated by sewing 2 layers of 5-0 Prolene on the excised  longitudinal opening of the vein. Clamps removed and excellent thrill was noted. The wounds were irrigated with saline. Hemostasis electrocautery. Wounds were closed with 3-0 Vicryl in the subcuticular layer. Sterile dressing and Ace wrap were applied and the patient was transferred to the recovery room in stable condition   [name], M.D., [facility] 03/15/2020 4:36 PM

## 2020-03-15 NOTE — Anesthesia Procedure Notes (Signed)
Procedure Name: LMA Insertion Date/Time: 03/15/2020 11:50 AM Performed by: Janene Harvey, CRNA Pre-anesthesia Checklist: Patient identified, Emergency Drugs available, Patient being monitored and Suction available Patient Re-evaluated:Patient Re-evaluated prior to induction Oxygen Delivery Method: Circle system utilized Preoxygenation: Pre-oxygenation with 100% oxygen Induction Type: IV induction LMA: LMA inserted LMA Size: 4.0 Airway Equipment and Method: Bite block Placement Confirmation: positive ETCO2 Tube secured with: Tape

## 2020-03-15 NOTE — Interval H&P Note (Signed)
History and Physical Interval Note:  03/15/2020 11:09 AM  Logan Martin  has presented today for surgery, with the diagnosis of ESRD.  The various methods of treatment have been discussed with the patient and family. After consideration of risks, benefits and other options for treatment, the patient has consented to  Procedure(s): RIGHT ARM ARTERIOVENOUS FISTULA REVISON (Right) INSERTION OF DIALYSIS CATHETER (N/A) as a surgical intervention.  The patient's history has been reviewed, patient examined, no change in status, stable for surgery.  I have reviewed the patient's chart and labs.  Questions were answered to the patient's satisfaction.     Curt Jews

## 2020-03-15 NOTE — Anesthesia Procedure Notes (Signed)
Procedure Name: Intubation Date/Time: 03/15/2020 1:25 PM Performed by: Janene Harvey, CRNA Pre-anesthesia Checklist: Patient identified, Emergency Drugs available, Suction available and Patient being monitored Patient Re-evaluated:Patient Re-evaluated prior to induction Oxygen Delivery Method: Circle system utilized Preoxygenation: Pre-oxygenation with 100% oxygen Induction Type: IV induction and Inhalational induction Ventilation: Mask ventilation without difficulty Laryngoscope Size: Mac and 4 Grade View: Grade I Tube type: Oral Tube size: 7.5 mm Number of attempts: 1 Airway Equipment and Method: Stylet and Oral airway Placement Confirmation: ETT inserted through vocal cords under direct vision,  positive ETCO2 and breath sounds checked- equal and bilateral Secured at: 22 cm Tube secured with: Tape Dental Injury: Teeth and Oropharynx as per pre-operative assessment  Comments: Pt on sevo via LMA, fio2 at 100%, LMA removed, DL with MAC 4, grade I view.

## 2020-03-15 NOTE — Anesthesia Preprocedure Evaluation (Addendum)
Anesthesia Evaluation  Patient identified by MRN, date of birth, ID band Patient awake    Reviewed: Allergy & Precautions, NPO status , Patient's Chart, lab work & pertinent test results, reviewed documented beta blocker date and time   Airway Mallampati: II  TM Distance: >3 FB Neck ROM: Full    Dental  (+) Teeth Intact, Dental Advisory Given, Poor Dentition   Pulmonary former smoker, PE (PE 2012) Quit smoking 1995,  Pack year history     + decreased breath sounds      Cardiovascular hypertension, Pt. 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 Started HD in [place], [place].  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  On NxStage home hemo since 2010. Using RUA AVF.     Hx bladder ca    Musculoskeletal  (+) Arthritis , Osteoarthritis,  Chronic LBP- on oxy 30mg    Abdominal   Peds  Hematology  (+) Blood dyscrasia, anemia , H/H 7.3/23.8, plt 110   Anesthesia Other Findings   Reproductive/Obstetrics negative OB ROS                            Anesthesia Physical Anesthesia Plan  ASA: IV  Anesthesia Plan: General   Post-op Pain Management:    Induction: Intravenous  PONV Risk Score and Plan: Treatment may vary due to age or medical condition, Ondansetron and Dexamethasone  Airway Management Planned: LMA  Additional Equipment: None  Intra-op Plan:   Post-operative Plan: Extubation in OR  Informed Consent: I have reviewed the patients History and Physical,  chart, labs and discussed the procedure including the risks, benefits and alternatives for the proposed anesthesia with the patient or authorized representative who has indicated his/her understanding and acceptance.     Dental advisory given  Plan Discussed with: CRNA  Anesthesia Plan Comments: (Allergy to heparin- gets alteplase for HD cath lock)       Anesthesia Quick Evaluation

## 2020-03-15 NOTE — Transfer of Care (Addendum)
Immediate Anesthesia Transfer of Care Note  Patient: Logan Martin  Procedure(s) Performed: RIGHT ARM ARTERIOVENOUS FISTULA REVISON (Right Arm Upper) INSERTION OF Left Internal Jugular DIALYSIS CATHETER (Left Neck)  Patient Location: PACU  Anesthesia Type:General  Level of Consciousness: awake  Airway & Oxygen Therapy: Patient Spontanous Breathing and Patient connected to face mask oxygen  Post-op Assessment: Report given to RN and Post -op Vital signs reviewed and stable  Post vital signs: Reviewed  Last Vitals:  Vitals Value Taken Time  BP 131/32 03/15/20 1532  Temp 36.6 C 03/15/20 1521  Pulse 103 03/15/20 1526  Resp 19 03/15/20 1537  SpO2 97 % 03/15/20 1526  Vitals shown include unvalidated device data.  Last Pain:  Vitals:   03/15/20 1521  TempSrc:   PainSc: 0-No pain      Patients Stated Pain Goal: 2 (21/19/41 7408)  Complications: No complications documented.

## 2020-03-15 NOTE — Progress Notes (Addendum)
[facility] Progress Note   Subjective:  Seen in room - feels ok today. Going for AVF revision and TDC shortly. Had some CP during HD yesterday resulting in shortened treatment, no CP today.   Objective Vitals:   03/14/20 1552 03/14/20 2028 03/15/20 0357 03/15/20 0401  BP: (!) 148/69 (!) 155/61 138/69   Pulse: 81 80 72 77  Resp: 16 18    Temp: 98.4 F (36.9 C) 97.8 F (36.6 C) 98.5 F (36.9 C)   TempSrc: Oral Oral Oral   SpO2: 91% 99%  100%  Weight:  53 kg    Height:       Physical Exam General: Well appearing man, NAD Heart: RRR; no murmur Lungs: CTAB; no rales Abdomen: soft, non-tender Extremities: No LE edema Dialysis Access: RUE AVF edematous and warm, temp cath in L IJ  Additional Objective Labs: Basic Metabolic Panel: Recent Labs  Lab 03/11/20 0339 03/11/20 0339 03/12/20 0350 03/14/20 0700 03/15/20 0920  NA 135   < > 138 135 136  K 4.7   < > 4.5 4.9 4.6  CL 93*   < > 97* 94* 97*  CO2 28  --  29 29  --   GLUCOSE 94   < > 83 99 87  BUN 72*   < > 33* 57* 31*  CREATININE 9.66*   < > 6.88* 11.20* 8.50*  CALCIUM 10.2  --  9.8 10.1  --   PHOS  --   --   --  2.9  --    < > = values in this interval not displayed.   Liver Function Tests: Recent Labs  Lab 03/10/20 0144 03/11/20 0339 03/14/20 0700  AST 18 15  --   ALT 11 12  --   ALKPHOS 73 62  --   BILITOT 0.7 0.7  --   PROT 7.1 6.4*  --   ALBUMIN 3.6 3.3* 3.4*   CBC: Recent Labs  Lab 03/10/20 0144 03/11/20 0339 03/11/20 1533 03/11/20 1533 03/12/20 0350 03/12/20 0350 03/13/20 0411 03/13/20 0411 03/14/20 0340 03/15/20 0108 03/15/20 0920  WBC 3.6*   < > 5.0   < > 4.8   < > 4.6  --  4.7 4.5  --   NEUTROABS 1.2*  --   --   --   --   --   --   --   --   --   --   HGB 8.4*   < > 9.1*   < > 8.2*   < > 8.6*   < > 7.8* 7.3* 8.5*  HCT 27.3*   < > 29.1*   < > 26.6*   < > 27.9*   < > 24.9* 23.8* 25.0*  MCV 99.3   < > 96.0  --  96.4  --  96.5  --  96.9 98.3  --   PLT 82*   < > 132*   <  > 107*   < > 106*  --  123* 110*  --    < > = values in this interval not displayed.   Blood Culture    Component Value Date/Time   SDES BLOOD LEFT HAND 03/10/2020 0146   SPECREQUEST  03/10/2020 0146    AEROBIC BOTTLE ONLY Blood Culture results may not be optimal due to an excessive volume of blood received in culture bottles   CULT  03/10/2020 0146    NO GROWTH 5 DAYS Performed at [facility], [place]  [address], [place], [place] [address]    REPTSTATUS 03/15/2020 FINAL 03/10/2020 0146   Medications: . sodium chloride 10 mL/hr at 03/15/20 0947  . [MAR Hold] anticoagulant sodium citrate     . [MAR Hold] amLODipine  10 mg Oral Daily  . [MAR Hold] aspirin EC  81 mg Oral QHS  . [MAR Hold] atorvastatin  40 mg Oral Daily  . [MAR Hold] Chlorhexidine Gluconate Cloth  6 each Topical Daily  . [MAR Hold] ferric citrate  420 mg Oral TID with meals  . [MAR Hold] isosorbide mononitrate  30 mg Oral Daily  . [MAR Hold] metoprolol succinate  50 mg Oral Daily  . [MAR Hold] vancomycin  500 mg Intravenous Q M,W,F-HD    Dialysis Orders: Home HD NxStage MTTS EDW 50 kg  OP Labs 10/7: Hgb 9.8, K 7.1!, Ca 10.7, P 5.3, PTH 209  OP HD cultures sent from arm are growing Staph urealyticus, R to methicillin, S to vanc  Problem/Plan: 1. Infected RUE AVF versus hematoma: VVS consulted, plan for revision with [facility] placement today. BCx 10/14 negative. OP wound Cx grew Staph urealyticus + Corynebactrium - sensitive to Vanc which he has been getting here.  2. ESRD: On home HD (NxStage). Will need to change to in-center temporarily while catheter in use, until able to use AVF again (this is due to infectious risk). Renal navigator working to arrange in-center HD slot -- not finalized yet. For HD on MWF schedule for now - next tomorrow. 3. BP/volume: BP controlled, no edema on exam. 4. Anemia: Hgb 8.3 - Received Aranesp 200 at outpatient center 10/13. Will redose tomorrow. 5. Metabolic bone disease: CorrCa  high, VDRA on hold. Continue Auryxia as binder.  Veneta Penton, PA-C 03/15/2020, 10:17 AM  [facility]  Pt seen, examined and agree w A/P as above.  Kelly Splinter  MD 03/15/2020, 1:41 PM

## 2020-03-15 NOTE — Anesthesia Postprocedure Evaluation (Signed)
Anesthesia Post Note  Patient: Logan Martin  Procedure(s) Performed: RIGHT ARM ARTERIOVENOUS FISTULA REVISON (Right Arm Upper) INSERTION OF Left Internal Jugular DIALYSIS CATHETER (Left Neck)     Patient location during evaluation: PACU Anesthesia Type: General Level of consciousness: awake and alert Pain management: pain level controlled Vital Signs Assessment: post-procedure vital signs reviewed and stable Respiratory status: spontaneous breathing, nonlabored ventilation, respiratory function stable and patient connected to nasal cannula oxygen Cardiovascular status: blood pressure returned to baseline and stable Postop Assessment: no apparent nausea or vomiting Anesthetic complications: no Comments: Patient with significant intraop blood loss and Hgb 6.8 on istat at procedure end, decision made to transfuse 2U PRBC in recovery room. Delay 2/2 to blood antibodies. Surgeon aware, discussion with blood bank and ultimate decision to give O- blood. Patient stable throughout.    No complications documented.  Last Vitals:  Vitals:   03/15/20 1709 03/15/20 1715  BP:  126/89  Pulse:  (!) 101  Resp:  17  Temp:  36.5 C  SpO2: 100% 100%    Last Pain:  Vitals:   03/15/20 1715  TempSrc: Temporal  PainSc:                  March Rummage [place]

## 2020-03-15 NOTE — Progress Notes (Signed)
[facility] unable to accommodate patient at this time for outpatient HD. Referral sent to [facility]. Navigator will continue to follow.   Alphonzo Cruise, [place] Renal Navigator [phone]

## 2020-03-15 NOTE — Progress Notes (Signed)
PROGRESS NOTE    Logan Martin  IPJ:[id number] DOB: 03-20-1956 DOA: 03/10/2020 PCP: [name], NP  Brief Narrative:Logan Martin is a 64/M with medical history significant of ESRD on home hemodialysis (4 times per week-MTTF)), coronary artery disease, chronic thrombocytopenia, anemia of chronic disease, hyperlipidemia, chronic back pain, hepatitis C, PVD, bladder cancer presented  to emergency department for evaluation of right upper arm-AV fistula site, which was more swollen and had some drainage, raising concern for infection -Patient was afebrile, vascular surgery and nephrology consulted -started Vancomycin, blood cultures were negative, VVS nephrology following, temporary HD catheter placed, now plan for aVF revision and tunneled HD cath  Assessment & Plan:   Right arm AV fistula swelling  -Infection versus hematoma, ultrasound with fluid collection, no fever or leukocytosis, blood cultures remain negative, patient reported chills and nausea  few days prior to admission -Day 6 of IV vancomycin, per renal note, outpatient superficial cultures with staph urealyticus and corynebacterium species, its significance is unknown -Nephrology and vascular surgery following,  -Temporary HD catheter placed in IR 10/15 - plan for [facility] and AVF revision today -Per Renal/VVS  ESRD on home hemodialysis -Last HD 10/12 -Per renal  Anemia of chronic disease -Hb stable now, continue EPO with HD -CBC in a.m.  Hypertension -Continue metoprolol, Imdur  Chronic thrombocytopenia -stable, Monitor  CAD/peripheral vascular disease -Continue aspirin, statin  Chronic lower back pain -On oxycodone at baseline  DVT prophylaxis: SCDs due to thrombocytopenia Code Status: Full code Family Communication: No family at bedside Disposition Plan:  Status is: Inpatient  Remains inpatient appropriate because:Inpatient level of care appropriate due to severity of illness   Dispo: The patient is  from: Home              Anticipated d/c is to: Home              Anticipated d/c date is: > 3 days              Patient currently is not medically stable to d/c.  Consultants:   VVS, Renal, IR   Procedures:   Antimicrobials:    Subjective: -Feels well, no events overnight, no pain swelling fevers or chills  Objective: Vitals:   03/14/20 1552 03/14/20 2028 03/15/20 0357 03/15/20 0401  BP: (!) 148/69 (!) 155/61 138/69   Pulse: 81 80 72 77  Resp: 16 18    Temp: 98.4 F (36.9 C) 97.8 F (36.6 C) 98.5 F (36.9 C)   TempSrc: Oral Oral Oral   SpO2: 91% 99%  100%  Weight:  53 kg    Height:        Intake/Output Summary (Last 24 hours) at 03/15/2020 1352 Last data filed at 03/15/2020 1344 Gross per 24 hour  Intake 1790 ml  Output 613 ml  Net 1177 ml   Filed Weights   03/12/20 2038 03/14/20 1150 03/14/20 2028  Weight: 43.5 kg 47.7 kg 53 kg    Examination:  General: Pleasant male laying in bed, AAOx3, no distress CVS: S1-S2, regular rate rhythm Lungs: Clear bilaterally Abdomen: Soft, nontender, bowel sounds present Extremities: No edema, right arm AV fistula with large scab and hypopigmentation, no drainage noted  Psychiatry:Mood & affect appropriate.     Data Reviewed:   CBC: Recent Labs  Lab 03/10/20 0144 03/11/20 0339 03/11/20 1533 03/11/20 1533 03/12/20 0350 03/13/20 0411 03/14/20 0340 03/15/20 0108 03/15/20 0920  WBC 3.6*   < > 5.0  --  4.8 4.6 4.7 4.5  --  NEUTROABS 1.2*  --   --   --   --   --   --   --   --   HGB 8.4*   < > 9.1*   < > 8.2* 8.6* 7.8* 7.3* 8.5*  HCT 27.3*   < > 29.1*   < > 26.6* 27.9* 24.9* 23.8* 25.0*  MCV 99.3   < > 96.0  --  96.4 96.5 96.9 98.3  --   PLT 82*   < > 132*  --  107* 106* 123* 110*  --    < > = values in this interval not displayed.   Basic Metabolic Panel: Recent Labs  Lab 03/10/20 0144 03/10/20 1741 03/11/20 0339 03/12/20 0350 03/14/20 0700 03/15/20 0920  NA 138  --  135 138 135 136  K 4.4  --  4.7  4.5 4.9 4.6  CL 92*  --  93* 97* 94* 97*  CO2 32  --  28 29 29   --   GLUCOSE 90  --  94 83 99 87  BUN 57*  --  72* 33* 57* 31*  CREATININE 7.46*  --  9.66* 6.88* 11.20* 8.50*  CALCIUM 10.3  --  10.2 9.8 10.1  --   MG 2.1 1.9  --   --   --   --   PHOS  --   --   --   --  2.9  --    GFR: Estimated Creatinine Clearance: 6.6 mL/min (A) (by C-G formula based on SCr of 8.5 mg/dL (H)). Liver Function Tests: Recent Labs  Lab 03/10/20 0144 03/11/20 0339 03/14/20 0700  AST 18 15  --   ALT 11 12  --   ALKPHOS 73 62  --   BILITOT 0.7 0.7  --   PROT 7.1 6.4*  --   ALBUMIN 3.6 3.3* 3.4*   No results for input(s): LIPASE, AMYLASE in the last 168 hours. No results for input(s): AMMONIA in the last 168 hours. Coagulation Profile: Recent Labs  Lab 03/10/20 0144  INR 1.1   Cardiac Enzymes: No results for input(s): CKTOTAL, CKMB, CKMBINDEX, TROPONINI in the last 168 hours. BNP (last 3 results) No results for input(s): PROBNP in the last 8760 hours. HbA1C: No results for input(s): HGBA1C in the last 72 hours. CBG: No results for input(s): GLUCAP in the last 168 hours. Lipid Profile: No results for input(s): CHOL, HDL, LDLCALC, TRIG, CHOLHDL, LDLDIRECT in the last 72 hours. Thyroid Function Tests: No results for input(s): TSH, T4TOTAL, FREET4, T3FREE, THYROIDAB in the last 72 hours. Anemia Panel: No results for input(s): VITAMINB12, FOLATE, FERRITIN, TIBC, IRON, RETICCTPCT in the last 72 hours. Urine analysis:    Component Value Date/Time   COLORURINE STRAW (A) 03/28/2018 0300   APPEARANCEUR CLEAR 03/28/2018 0300   LABSPEC 1.000 (L) 03/28/2018 0300   PHURINE 6.0 03/28/2018 0300   GLUCOSEU NEGATIVE 03/28/2018 0300   HGBUR NEGATIVE 03/28/2018 0300   BILIRUBINUR NEGATIVE 03/28/2018 0300   KETONESUR NEGATIVE 03/28/2018 0300   PROTEINUR NEGATIVE 03/28/2018 0300   NITRITE NEGATIVE 03/28/2018 0300   LEUKOCYTESUR NEGATIVE 03/28/2018 0300   Sepsis  Labs: @LABRCNTIP (procalcitonin:4,lacticidven:4)  ) Recent Results (from the past 240 hour(s))  Culture, blood (Routine x 2)     Status: None   Collection Time: 03/10/20  1:30 AM   Specimen: BLOOD LEFT ARM  Result Value Ref Range Status   Specimen Description BLOOD LEFT ARM  Final   Special Requests   Final    BOTTLES  DRAWN AEROBIC AND ANAEROBIC Blood Culture adequate volume   Culture   Final    NO GROWTH 5 DAYS Performed at [facility], [place] [address], [place], [place] [address]    Report Status 03/15/2020 FINAL  Final  Culture, blood (Routine x 2)     Status: None   Collection Time: 03/10/20  1:46 AM   Specimen: BLOOD LEFT HAND  Result Value Ref Range Status   Specimen Description BLOOD LEFT HAND  Final   Special Requests   Final    AEROBIC BOTTLE ONLY Blood Culture results may not be optimal due to an excessive volume of blood received in culture bottles   Culture   Final    NO GROWTH 5 DAYS Performed at [facility], [place] [address], [place], [place] [address]    Report Status 03/15/2020 FINAL  Final  Respiratory Panel by RT PCR (Flu A&B, Covid) - Nasopharyngeal Swab     Status: None   Collection Time: 03/10/20 11:41 AM   Specimen: Nasopharyngeal Swab  Result Value Ref Range Status   SARS Coronavirus 2 by RT PCR NEGATIVE NEGATIVE Final    Comment: (NOTE) SARS-CoV-2 target nucleic acids are NOT DETECTED.  The SARS-CoV-2 RNA is generally detectable in upper respiratoy specimens during the acute phase of infection. The lowest concentration of SARS-CoV-2 viral copies this assay can detect is 131 copies/mL. A negative result does not preclude SARS-Cov-2 infection and should not be used as the sole basis for treatment or other patient management decisions. A negative result may occur with  improper specimen collection/handling, submission of specimen other than nasopharyngeal swab, presence of viral mutation(s) within the areas targeted by this assay, and  inadequate number of viral copies (<131 copies/mL). A negative result must be combined with clinical observations, patient history, and epidemiological information. The expected result is Negative.  Fact Sheet for Patients:  PinkCheek.be  Fact Sheet for Healthcare Providers:  GravelBags.it  This test is no t yet approved or cleared by the [place] FDA and  has been authorized for detection and/or diagnosis of SARS-CoV-2 by FDA under an Emergency Use Authorization (EUA). This EUA will remain  in effect (meaning this test can be used) for the duration of the COVID-19 declaration under Section 564(b)(1) of the Act, 21 U.S.C. section 360bbb-3(b)(1), unless the authorization is terminated or revoked sooner.     Influenza A by PCR NEGATIVE NEGATIVE Final   Influenza B by PCR NEGATIVE NEGATIVE Final    Comment: (NOTE) The Xpert Xpress SARS-CoV-2/FLU/RSV assay is intended as an aid in  the diagnosis of influenza from Nasopharyngeal swab specimens and  should not be used as a sole basis for treatment. Nasal washings and  aspirates are unacceptable for Xpert Xpress SARS-CoV-2/FLU/RSV  testing.  Fact Sheet for Patients: PinkCheek.be  Fact Sheet for Healthcare Providers: GravelBags.it  This test is not yet approved or cleared by the [place] FDA and  has been authorized for detection and/or diagnosis of SARS-CoV-2 by  FDA under an Emergency Use Authorization (EUA). This EUA will remain  in effect (meaning this test can be used) for the duration of the  Covid-19 declaration under Section 564(b)(1) of the Act, 21  U.S.C. section 360bbb-3(b)(1), unless the authorization is  terminated or revoked. Performed at [facility], [place] [address], [place],  [address]   Surgical pcr screen     Status: None   Collection Time: 03/15/20  6:53 AM   Specimen: Nasal  Mucosa; Nasal Swab  Result Value Ref Range Status   MRSA, PCR NEGATIVE NEGATIVE Final   Staphylococcus aureus NEGATIVE NEGATIVE Final    Comment: (NOTE) The Xpert SA Assay (FDA approved for NASAL specimens in patients 71 years of age and older), is one component of a comprehensive surveillance program. It is not intended to diagnose infection nor to guide or monitor treatment. Performed at [facility], [place] [address] [place], [place] [address]     Radiology Studies: DG Fluoro Guide CV Line-No Report  Result Date: 03/15/2020 Fluoroscopy was utilized by the requesting physician.  No radiographic interpretation.   Scheduled Meds: . sodium chloride   Intravenous Once  . [MAR Hold] amLODipine  10 mg Oral Daily  . [MAR Hold] aspirin EC  81 mg Oral QHS  . [MAR Hold] atorvastatin  40 mg Oral Daily  . [MAR Hold] Chlorhexidine Gluconate Cloth  6 each Topical Daily  . [MAR Hold] ferric citrate  420 mg Oral TID with meals  . [MAR Hold] isosorbide mononitrate  30 mg Oral Daily  . [MAR Hold] metoprolol succinate  50 mg Oral Daily  . [MAR Hold] vancomycin  500 mg Intravenous Q M,W,F-HD   Continuous Infusions: . sodium chloride 10 mL/hr at 03/15/20 0914  . [MAR Hold] anticoagulant sodium citrate    . anticoagulant sodium citrate       LOS: 5 days   Time spent: 39min  Domenic Polite, MD [facility]  03/15/2020, 1:52 PM

## 2020-03-15 NOTE — Progress Notes (Signed)
Renal Navigator requested temporary in-center seat at [facility] for patient while he awaits use of fistula. Renal Navigator will follow closely to see if they can accommodate patient at this time.  Alphonzo Cruise,  Renal Navigator [phone]

## 2020-03-16 ENCOUNTER — Encounter (HOSPITAL_COMMUNITY): Payer: Self-pay | Admitting: Vascular Surgery

## 2020-03-16 DIAGNOSIS — I739 Peripheral vascular disease, unspecified: Secondary | ICD-10-CM

## 2020-03-16 DIAGNOSIS — D696 Thrombocytopenia, unspecified: Secondary | ICD-10-CM

## 2020-03-16 LAB — CBC
HCT: 27.9 % — ABNORMAL LOW (ref 39.0–52.0)
Hemoglobin: 9.1 g/dL — ABNORMAL LOW (ref 13.0–17.0)
MCH: 30.7 pg (ref 26.0–34.0)
MCHC: 32.6 g/dL (ref 30.0–36.0)
MCV: 94.3 fL (ref 80.0–100.0)
Platelets: 128 10*3/uL — ABNORMAL LOW (ref 150–400)
RBC: 2.96 MIL/uL — ABNORMAL LOW (ref 4.22–5.81)
RDW: 17.7 % — ABNORMAL HIGH (ref 11.5–15.5)
WBC: 6.6 10*3/uL (ref 4.0–10.5)
nRBC: 0.3 % — ABNORMAL HIGH (ref 0.0–0.2)

## 2020-03-16 LAB — RENAL FUNCTION PANEL
Albumin: 3.8 g/dL (ref 3.5–5.0)
Albumin: 4 g/dL (ref 3.5–5.0)
Anion gap: 13 (ref 5–15)
Anion gap: 14 (ref 5–15)
BUN: 40 mg/dL — ABNORMAL HIGH (ref 8–23)
BUN: 12 mg/dL (ref 8–23)
CO2: 24 mmol/L (ref 22–32)
CO2: 27 mmol/L (ref 22–32)
Calcium: 9.5 mg/dL (ref 8.9–10.3)
Calcium: 8.6 mg/dL — ABNORMAL LOW (ref 8.9–10.3)
Chloride: 99 mmol/L (ref 98–111)
Chloride: 96 mmol/L — ABNORMAL LOW (ref 98–111)
Creatinine, Ser: 9.58 mg/dL — ABNORMAL HIGH (ref 0.61–1.24)
Creatinine, Ser: 3.99 mg/dL — ABNORMAL HIGH (ref 0.61–1.24)
GFR, Estimated: 5 mL/min — ABNORMAL LOW (ref >60)
GFR, Estimated: 15 mL/min — ABNORMAL LOW (ref >60)
Glucose, Bld: 95 mg/dL (ref 70–99)
Glucose, Bld: 110 mg/dL — ABNORMAL HIGH (ref 70–99)
Phosphorus: 4.5 mg/dL (ref 2.5–4.6)
Phosphorus: 2.3 mg/dL — ABNORMAL LOW (ref 2.5–4.6)
Potassium: 5.1 mmol/L (ref 3.5–5.1)
Potassium: 3.3 mmol/L — ABNORMAL LOW (ref 3.5–5.1)
Sodium: 136 mmol/L (ref 135–145)
Sodium: 137 mmol/L (ref 135–145)

## 2020-03-16 MED ORDER — HYDROMORPHONE HCL 1 MG/ML IJ SOLN
INTRAMUSCULAR | Status: AC
  Filled 2020-03-16: qty 1

## 2020-03-16 MED ORDER — VANCOMYCIN HCL IN DEXTROSE 500-5 MG/100ML-% IV SOLN
INTRAVENOUS | Status: AC
  Administered 2020-03-16: 500 mg via INTRAVENOUS
  Filled 2020-03-16: qty 100

## 2020-03-16 MED ORDER — ANTICOAGULANT SODIUM CITRATE 4% (200MG/5ML) IV SOLN
5.0000 mL | Freq: Once | Status: AC
  Administered 2020-03-16: 5 mL
  Filled 2020-03-16: qty 5

## 2020-03-16 NOTE — Addendum Note (Signed)
Addendum  created 03/16/20 0957 by Janene Harvey, CRNA   Flowsheet accepted, Intraprocedure Event edited, Intraprocedure Flowsheets edited, Intraprocedure Staff edited

## 2020-03-16 NOTE — Progress Notes (Signed)
Treatment ended about 20 miinutes early due to chest pain. Scheduled for 4 hours with 1 liter goal. Resolving once treatment ended. Alerted floor RN Renee.

## 2020-03-16 NOTE — Progress Notes (Addendum)
  Progress Note    03/16/2020 8:27 AM 1 Day Post-Op  Subjective: no complaints. Seen in HD. Catheter working well   Vitals:   03/15/20 2050 03/16/20 0439  BP: (!) 153/74 136/65  Pulse: 82 92  Resp: 18 17  Temp: 98.4 F (36.9 C) 98 F (36.7 C)  SpO2: 100% 100%   Physical Exam: Cardiac: regular Lungs: non labored Incisions: right upper extremity incision clean, dry and intact. No swelling or hematoma. ACE reapplied Extremities: 2+ radial pulse. Right hand warm. 5/5 grip strenght Neurologic: alert and oriented  CBC    Component Value Date/Time   WBC 6.6 03/16/2020 0755   RBC 2.96 (L) 03/16/2020 0755   HGB 9.1 (L) 03/16/2020 0755   HCT 27.9 (L) 03/16/2020 0755   PLT 128 (L) 03/16/2020 0755   MCV 94.3 03/16/2020 0755   MCH 30.7 03/16/2020 0755   MCHC 32.6 03/16/2020 0755   RDW 17.7 (H) 03/16/2020 0755   LYMPHSABS 1.5 03/10/2020 0144   MONOABS 0.5 03/10/2020 0144   EOSABS 0.5 03/10/2020 0144   BASOSABS 0.0 03/10/2020 0144    BMET    Component Value Date/Time   NA 136 03/16/2020 0756   K 5.1 03/16/2020 0756   CL 99 03/16/2020 0756   CO2 24 03/16/2020 0756   GLUCOSE 95 03/16/2020 0756   BUN 40 (H) 03/16/2020 0756   CREATININE 9.58 (H) 03/16/2020 0756   CREATININE 5.98 (H) 03/28/2017 1638   CALCIUM 9.5 03/16/2020 0756   GFRNONAA 5 (L) 03/16/2020 0756   GFRAA 9 (L) 03/04/2019 0840    INR    Component Value Date/Time   INR 1.1 03/10/2020 0144     Intake/Output Summary (Last 24 hours) at 03/16/2020 0827 Last data filed at 03/16/2020 0439 Gross per 24 hour  Intake 2630 ml  Output 2150 ml  Net 480 ml     Assessment/Plan:  64 y.o. male is s/p #1 left IJ tunneled hemodialysis catheter, #2 revision of right upper arm AV fistula with excision of ulcerated area and plication of fistula 1 Day Post-Op. Right upper arm without steal symptoms. Incision c/d/i. No swelling or hematoma. No steal symptoms. Okay for discharge from vascular stand point. Follow up in  2-3 weeks for incision check   Karoline Caldwell, PA-C [facility] [phone] 03/16/2020 8:27 AM   I have examined the patient, reviewed and agree with above.  Curt Jews, MD 03/16/2020 3:42 PM

## 2020-03-16 NOTE — Progress Notes (Signed)
Patient has been accepted at [facility] on a TTS schedule with a seat time of 6:30am. He needs to arrive at 6:10am. If he is discharged Thursday or Friday, we will plan his first in-center HD treatment (and next HD treatment to get him on schedule) on Saturday, 03/19/20. He will need to go to the clinic on Friday, 03/18/20, between 8:00am-4:00pm to complete intake paperwork. Navigator met with patient to discuss. He is disappointed with this clinic location, as he states it is very far from his home. Navigator asked him about transportation and he states he does not have any. He has Medicaid, so Navigator asked patient to call Medicaid now to enroll, and explained process. He also states he has Access [place], which can be used until [employer] is confirmed. Patient got on the phone and is on hold with Medicaid Transportation before Navigator left the room. Navigator will follow up at patient's discharge, but he states understanding of plan.  Alphonzo Cruise, [place] Renal Navigator [phone]

## 2020-03-16 NOTE — Progress Notes (Signed)
[facility] Progress Note   Subjective:  Seen on HD - 1L net UFG and tolerating. No new CP/dyspnea. Arm pain reasonably controlled after surgery yesterday. TDC in use today without issues.   Objective Vitals:   03/15/20 1709 03/15/20 1715 03/15/20 2050 03/16/20 0439  BP:  126/89 (!) 153/74 136/65  Pulse:  (!) 101 82 92  Resp:  17 18 17   Temp:  97.7 F (36.5 C) 98.4 F (36.9 C) 98 F (36.7 C)  TempSrc:  Temporal  Oral  SpO2: 100% 100% 100% 100%  Weight:   53 kg   Height:       Physical Exam General: Well appearing man, NAD Heart: RRR; no murmur Lungs: CTAB; no rales Abdomen: soft, non-tender Extremities: No LE edema Dialysis Access: TDC in L chest, bandaged RUE s/p AVF repair/revision  Additional Objective Labs: Basic Metabolic Panel: Recent Labs  Lab 03/12/20 0350 03/12/20 0350 03/14/20 0700 03/14/20 0700 03/15/20 0920 03/15/20 1331 03/16/20 0756  NA 138   < > 135   < > 136 141 136  K 4.5   < > 4.9   < > 4.6 4.7 5.1  CL 97*   < > 94*  --  97*  --  99  CO2 29  --  29  --   --   --  24  GLUCOSE 83   < > 99  --  87  --  95  BUN 33*   < > 57*  --  31*  --  40*  CREATININE 6.88*   < > 11.20*  --  8.50*  --  9.58*  CALCIUM 9.8  --  10.1  --   --   --  9.5  PHOS  --   --  2.9  --   --   --  4.5   < > = values in this interval not displayed.   Liver Function Tests: Recent Labs  Lab 03/10/20 0144 03/10/20 0144 03/11/20 0339 03/14/20 0700 03/16/20 0756  AST 18  --  15  --   --   ALT 11  --  12  --   --   ALKPHOS 73  --  62  --   --   BILITOT 0.7  --  0.7  --   --   PROT 7.1  --  6.4*  --   --   ALBUMIN 3.6   < > 3.3* 3.4* 3.8   < > = values in this interval not displayed.   CBC: Recent Labs  Lab 03/10/20 0144 03/11/20 0339 03/12/20 0350 03/12/20 0350 03/13/20 0411 03/13/20 0411 03/14/20 0340 03/14/20 0340 03/15/20 0108 03/15/20 0108 03/15/20 0920 03/15/20 1331 03/16/20 0755  WBC 3.6*   < > 4.8   < > 4.6   < > 4.7  --  4.5  --    --   --  6.6  NEUTROABS 1.2*  --   --   --   --   --   --   --   --   --   --   --   --   HGB 8.4*   < > 8.2*   < > 8.6*   < > 7.8*   < > 7.3*   < > 8.5* 6.8* 9.1*  HCT 27.3*   < > 26.6*   < > 27.9*   < > 24.9*   < > 23.8*   < > 25.0* 20.0* 27.9*  MCV 99.3   < >  96.4  --  96.5  --  96.9  --  98.3  --   --   --  94.3  PLT 82*   < > 107*   < > 106*   < > 123*  --  110*  --   --   --  128*   < > = values in this interval not displayed.   Studies/Results: DG CHEST PORT 1 VIEW  Result Date: 03/15/2020 CLINICAL DATA:  Dialysis catheter EXAM: PORTABLE CHEST 1 VIEW COMPARISON:  03/10/2020 FINDINGS: Right subclavian and brachiocephalic region stent again noted. Left-sided central venous catheter with tip projecting over the distal SVC region. No focal opacity or pleural effusion. Enlarged cardiomediastinal silhouette. No pneumothorax. Aortic atherosclerosis. IMPRESSION: Left-sided central venous catheter tip overlies the distal SVC region. No pneumothorax. Cardiomegaly. Electronically Signed   By: Donavan Foil M.D.   On: 03/15/2020 15:56   DG Fluoro Guide CV Line-No Report  Result Date: 03/15/2020 Fluoroscopy was utilized by the requesting physician.  No radiographic interpretation.   Medications: . anticoagulant sodium citrate     . sodium chloride   Intravenous Once  . amLODipine  10 mg Oral Daily  . aspirin EC  81 mg Oral QHS  . atorvastatin  40 mg Oral Daily  . Chlorhexidine Gluconate Cloth  6 each Topical Daily  . ferric citrate  420 mg Oral TID with meals  . isosorbide mononitrate  30 mg Oral Daily  . metoprolol succinate  50 mg Oral Daily  . vancomycin  500 mg Intravenous Q M,W,F-HD    Dialysis Orders: Home HD NxStage MTTS EDW 50 kg  OP Labs 10/7: Hgb 9.8, K 7.1!, Ca 10.7, P 5.3, PTH 209  OP HD cultures sent from arm are growing Staph urealyticus, R to methicillin, S to vanc  Problem/Plan: 1. Infected RUE AVF versus hematoma: VVS consulted, now s/p [facility] placement t+ AVF  revision on 10/19. BCx 10/14 negative. OP wound Cx grew Staph urealyticus + Corynebactrium - sensitive to Vanc which he has been getting here.  2. ESRD: On home HD (NxStage). Will need to change to in-center temporarily while catheter in use, until able to use AVF again (this is due to infectious risk). Renal navigator working to arrange in-center HD slot -- not finalized yet. For HD on MWF schedule for now - HD now. 3. BP/volume: BP controlled, no edema on exam. 4. Anemia: Hgb 9.1 - Received Aranesp 200 at outpatient center 10/13, re-dosing today. S/p 2U PRBCs 54/36. 5. Metabolic bone disease: CorrCa high, VDRA on hold. Continue Auryxia as binder.  Veneta Penton, PA-C 03/16/2020, 8:46 AM  [employer]

## 2020-03-16 NOTE — Progress Notes (Signed)
Renal Navigator requested update on referral for outpatient HD seat and awaiting reply.  Navigator will continue to follow closely.  Alphonzo Cruise, [place] Renal Navigator [phone]

## 2020-03-16 NOTE — Progress Notes (Signed)
Patient ID: Logan Martin, male   DOB: 1955/08/10, 64 y.o.   MRN: [id number]  PROGRESS NOTE    Logan Martin  YQM:[id number] DOB: 06-05-1955 DOA: 03/10/2020 PCP: [name], NP   Brief Narrative:  64/M with medical history significant ofESRD on home hemodialysis (4 times per week-MTTF)), coronary artery disease, chronic thrombocytopenia, anemia of chronic disease, hyperlipidemia, chronic back pain, hepatitis C, PVD, bladder cancer presented  to emergency department for evaluation of right upper arm-AV fistula site, which was more swollen and had some drainage, raising concern for infection -Patient was afebrile, vascular surgery and nephrology consulted -He was started on vancomycin.  Assessment & Plan:   Right arm AV fistula swelling -Infection versus hematoma, ultrasound with fluid collection, no fever or leukocytosis, blood cultures remain negative, patient reported chills and nausea  few days prior to admission -Day #7 of IV vancomycin. per renal note, outpatient superficial cultures with staph urealyticus and corynebacterium species, its significance is unknown.  Cultures from admission have been negative so far. -Nephrology and vascular surgery following -Temporary HD catheter placed in IR on 03/11/2020 -Status post left IJ tunneled HD catheter placement and revision of right upper arm AV fistula with excision of ulcerated area and plication of fistula by vascular surgery on 03/15/2020.  Wound care as per vascular surgery recommendations.  Vascular surgery has cleared the patient for discharge with outpatient follow-up with vascular surgery.  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  Dialysis as per nephrology schedule.  Patient will need arrangement for outpatient hemodialysis prior to discharge.  Anemia of chronic disease -Hemoglobin 6.8 on 03/15/2020.  Status post 1 unit packed red cells transfusion.  Hemoglobin stable this morning.   Monitor  Hypertension -Continue metoprolol, Imdur  Chronic thrombocytopenia -stable, Monitor  CAD/peripheral vascular disease -Continue aspirin, statin  Chronic lower back pain -On oxycodone at baseline  Generalized deconditioning -PT eval    DVT prophylaxis: SCDs Code Status: Full Family Communication: None at bedside Disposition Plan: Status is: Inpatient  Remains inpatient appropriate because:Inpatient level of care appropriate due to severity of illness.  Patient will need arrangement for outpatient hemodialysis prior to discharge.   Dispo: The patient is from: Home              Anticipated d/c is to: Home              Anticipated d/c date is: 2 days              Patient currently is not medically stable to d/c.   Consultants: Nephrology/IR/vascular surgery  Procedures:  Temporary HD catheter placed in IR on 03/11/2020 -left IJ tunneled HD catheter placement and revision of right upper arm AV fistula with excision of ulcerated area and plication of fistula by vascular surgery on 03/15/2020  Antimicrobials:  Anti-infectives (From admission, onward)   Start     Dose/Rate Route Frequency Ordered Stop   03/14/20 1200  vancomycin (VANCOCIN) IVPB 500 mg/100 ml premix        500 mg 100 mL/hr over 60 Minutes Intravenous Every M-W-F (Hemodialysis) 03/13/20 0855     03/11/20 1630  vancomycin (VANCOCIN) IVPB 500 mg/100 ml premix        500 mg 100 mL/hr over 60 Minutes Intravenous Every M-W-F (Hemodialysis) 03/11/20 1537 03/11/20 1826   03/11/20 0000  vancomycin variable dose per unstable renal function (pharmacist dosing)  Status:  Discontinued         Does not apply See admin instructions 03/10/20 1242 03/13/20 0855  03/10/20 1245  vancomycin (VANCOCIN) IVPB 1000 mg/200 mL premix        1,000 mg 200 mL/hr over 60 Minutes Intravenous  Once 03/10/20 1242 03/10/20 1513       Subjective: Patient seen and examined at bedside undergoing hemodialysis.  Denies any  overnight fever, vomiting, shortness of breath.  Objective: Vitals:   03/16/20 0830 03/16/20 0900 03/16/20 0930 03/16/20 1000  BP: (!) 148/79 (!) 144/75 139/80 (!) 141/72  Pulse: 82 83 80 85  Resp:    17  Temp:      TempSrc:      SpO2:      Weight:      Height:        Intake/Output Summary (Last 24 hours) at 03/16/2020 1035 Last data filed at 03/16/2020 1013 Gross per 24 hour  Intake 3820.53 ml  Output 2150 ml  Net 1670.53 ml   Filed Weights   03/14/20 2028 03/15/20 2050 03/16/20 0729  Weight: 53 kg 53 kg 49.1 kg    Examination:  General exam: Appears calm and comfortable.  Looks chronically ill.  Poor historian. Respiratory system: Bilateral decreased breath sounds at bases with some scattered crackles Cardiovascular system: S1 & S2 heard, Rate controlled Gastrointestinal system: Abdomen is nondistended, soft and nontender. Normal bowel sounds heard. Extremities: No cyanosis, clubbing; trace lower extremity edema present Central nervous system: Alert and oriented. No focal neurological deficits. Moving extremities  Skin: No obvious ecchymosis.  Right arm dressing present. Psychiatry: Flat affect.    Data Reviewed: I have personally reviewed following labs and imaging studies  CBC: Recent Labs  Lab 03/10/20 0144 03/11/20 0339 03/12/20 0350 03/12/20 0350 03/13/20 0411 03/13/20 0411 03/14/20 0340 03/15/20 0108 03/15/20 0920 03/15/20 1331 03/16/20 0755  WBC 3.6*   < > 4.8  --  4.6  --  4.7 4.5  --   --  6.6  NEUTROABS 1.2*  --   --   --   --   --   --   --   --   --   --   HGB 8.4*   < > 8.2*   < > 8.6*   < > 7.8* 7.3* 8.5* 6.8* 9.1*  HCT 27.3*   < > 26.6*   < > 27.9*   < > 24.9* 23.8* 25.0* 20.0* 27.9*  MCV 99.3   < > 96.4  --  96.5  --  96.9 98.3  --   --  94.3  PLT 82*   < > 107*  --  106*  --  123* 110*  --   --  128*   < > = values in this interval not displayed.   Basic Metabolic Panel: Recent Labs  Lab 03/10/20 0144 03/10/20 0144 03/10/20 1741  03/11/20 0339 03/11/20 0339 03/12/20 0350 03/14/20 0700 03/15/20 0920 03/15/20 1331 03/16/20 0756  NA 138   < >  --  135   < > 138 135 136 141 136  K 4.4   < >  --  4.7   < > 4.5 4.9 4.6 4.7 5.1  CL 92*   < >  --  93*  --  97* 94* 97*  --  99  CO2 32  --   --  28  --  29 29  --   --  24  GLUCOSE 90   < >  --  94  --  83 99 87  --  95  BUN 57*   < >  --  72*  --  33* 57* 31*  --  40*  CREATININE 7.46*   < >  --  9.66*  --  6.88* 11.20* 8.50*  --  9.58*  CALCIUM 10.3  --   --  10.2  --  9.8 10.1  --   --  9.5  MG 2.1  --  1.9  --   --   --   --   --   --   --   PHOS  --   --   --   --   --   --  2.9  --   --  4.5   < > = values in this interval not displayed.   GFR: Estimated Creatinine Clearance: 5.4 mL/min (A) (by C-G formula based on SCr of 9.58 mg/dL (H)). Liver Function Tests: Recent Labs  Lab 03/10/20 0144 03/11/20 0339 03/14/20 0700 03/16/20 0756  AST 18 15  --   --   ALT 11 12  --   --   ALKPHOS 73 62  --   --   BILITOT 0.7 0.7  --   --   PROT 7.1 6.4*  --   --   ALBUMIN 3.6 3.3* 3.4* 3.8   No results for input(s): LIPASE, AMYLASE in the last 168 hours. No results for input(s): AMMONIA in the last 168 hours. Coagulation Profile: Recent Labs  Lab 03/10/20 0144  INR 1.1   Cardiac Enzymes: No results for input(s): CKTOTAL, CKMB, CKMBINDEX, TROPONINI in the last 168 hours. BNP (last 3 results) No results for input(s): PROBNP in the last 8760 hours. HbA1C: No results for input(s): HGBA1C in the last 72 hours. CBG: No results for input(s): GLUCAP in the last 168 hours. Lipid Profile: No results for input(s): CHOL, HDL, LDLCALC, TRIG, CHOLHDL, LDLDIRECT in the last 72 hours. Thyroid Function Tests: No results for input(s): TSH, T4TOTAL, FREET4, T3FREE, THYROIDAB in the last 72 hours. Anemia Panel: No results for input(s): VITAMINB12, FOLATE, FERRITIN, TIBC, IRON, RETICCTPCT in the last 72 hours. Sepsis Labs: Recent Labs  Lab 03/10/20 0144 03/10/20 1741   LATICACIDVEN 1.1 2.2*    Recent Results (from the past 240 hour(s))  Culture, blood (Routine x 2)     Status: None   Collection Time: 03/10/20  1:30 AM   Specimen: BLOOD LEFT ARM  Result Value Ref Range Status   Specimen Description BLOOD LEFT ARM  Final   Special Requests   Final    BOTTLES DRAWN AEROBIC AND ANAEROBIC Blood Culture adequate volume   Culture   Final    NO GROWTH 5 DAYS Performed at [facility], [address] [place], [place] [address]    Report Status 03/15/2020 FINAL  Final  Culture, blood (Routine x 2)     Status: None   Collection Time: 03/10/20  1:46 AM   Specimen: BLOOD LEFT HAND  Result Value Ref Range Status   Specimen Description BLOOD LEFT HAND  Final   Special Requests   Final    AEROBIC BOTTLE ONLY Blood Culture results may not be optimal due to an excessive volume of blood received in culture bottles   Culture   Final    NO GROWTH 5 DAYS Performed at [facility], [place] [address] [place], [place] [address]    Report Status 03/15/2020 FINAL  Final  Respiratory Panel by RT PCR (Flu A&B, Covid) - Nasopharyngeal Swab     Status: None   Collection Time: 03/10/20 11:41  AM   Specimen: Nasopharyngeal Swab  Result Value Ref Range Status   SARS Coronavirus 2 by RT PCR NEGATIVE NEGATIVE Final    Comment: (NOTE) SARS-CoV-2 target nucleic acids are NOT DETECTED.  The SARS-CoV-2 RNA is generally detectable in upper respiratoy specimens during the acute phase of infection. The lowest concentration of SARS-CoV-2 viral copies this assay can detect is 131 copies/mL. A negative result does not preclude SARS-Cov-2 infection and should not be used as the sole basis for treatment or other patient management decisions. A negative result may occur with  improper specimen collection/handling, submission of specimen other than nasopharyngeal swab, presence of viral mutation(s) within the areas targeted by this assay, and inadequate number of viral  copies (<131 copies/mL). A negative result must be combined with clinical observations, patient history, and epidemiological information. The expected result is Negative.  Fact Sheet for Patients:  PinkCheek.be  Fact Sheet for Healthcare Providers:  GravelBags.it  This test is no t yet approved or cleared by the [place] FDA and  has been authorized for detection and/or diagnosis of SARS-CoV-2 by FDA under an Emergency Use Authorization (EUA). This EUA will remain  in effect (meaning this test can be used) for the duration of the COVID-19 declaration under Section 564(b)(1) of the Act, 21 U.S.C. section 360bbb-3(b)(1), unless the authorization is terminated or revoked sooner.     Influenza A by PCR NEGATIVE NEGATIVE Final   Influenza B by PCR NEGATIVE NEGATIVE Final    Comment: (NOTE) The Xpert Xpress SARS-CoV-2/FLU/RSV assay is intended as an aid in  the diagnosis of influenza from Nasopharyngeal swab specimens and  should not be used as a sole basis for treatment. Nasal washings and  aspirates are unacceptable for Xpert Xpress SARS-CoV-2/FLU/RSV  testing.  Fact Sheet for Patients: PinkCheek.be  Fact Sheet for Healthcare Providers: GravelBags.it  This test is not yet approved or cleared by the [place] FDA and  has been authorized for detection and/or diagnosis of SARS-CoV-2 by  FDA under an Emergency Use Authorization (EUA). This EUA will remain  in effect (meaning this test can be used) for the duration of the  Covid-19 declaration under Section 564(b)(1) of the Act, 21  U.S.C. section 360bbb-3(b)(1), unless the authorization is  terminated or revoked. Performed at [facility], [place] [address], [place], [place] [address]   Surgical pcr screen     Status: None   Collection Time: 03/15/20  6:53 AM   Specimen: Nasal Mucosa; Nasal Swab   Result Value Ref Range Status   MRSA, PCR NEGATIVE NEGATIVE Final   Staphylococcus aureus NEGATIVE NEGATIVE Final    Comment: (NOTE) The Xpert SA Assay (FDA approved for NASAL specimens in patients 35 years of age and older), is one component of a comprehensive surveillance program. It is not intended to diagnose infection nor to guide or monitor treatment. Performed at [facility], [place] [address] [place], [place] [address]          Radiology Studies: DG CHEST PORT 1 VIEW  Result Date: 03/15/2020 CLINICAL DATA:  Dialysis catheter EXAM: PORTABLE CHEST 1 VIEW COMPARISON:  03/10/2020 FINDINGS: Right subclavian and brachiocephalic region stent again noted. Left-sided central venous catheter with tip projecting over the distal SVC region. No focal opacity or pleural effusion. Enlarged cardiomediastinal silhouette. No pneumothorax. Aortic atherosclerosis. IMPRESSION: Left-sided central venous catheter tip overlies the distal SVC region. No pneumothorax. Cardiomegaly. Electronically Signed   By: Donavan Foil M.D.   On: 03/15/2020  15:56   DG Fluoro Guide CV Line-No Report  Result Date: 03/15/2020 Fluoroscopy was utilized by the requesting physician.  No radiographic interpretation.        Scheduled Meds: . sodium chloride   Intravenous Once  . amLODipine  10 mg Oral Daily  . aspirin EC  81 mg Oral QHS  . atorvastatin  40 mg Oral Daily  . Chlorhexidine Gluconate Cloth  6 each Topical Daily  . ferric citrate  420 mg Oral TID with meals  . isosorbide mononitrate  30 mg Oral Daily  . metoprolol succinate  50 mg Oral Daily  . vancomycin  500 mg Intravenous Q M,W,F-HD   Continuous Infusions: . anticoagulant sodium citrate            Aline August, MD [facility] 03/16/2020, 10:35 AM

## 2020-03-16 NOTE — Progress Notes (Signed)
Pharmacy Antibiotic Note  Logan Martin is a 64 y.o. male admitted on 03/10/2020 with cellulitis of dialysis fistula site.  Pharmacy has been consulted for vancomycin dosing. ESRD on home HD MTTS - inpatient dialyzing on MWF, last 10/20.  Plan: Vancomycin 500 mg IV qHD MWF Monitor clinical progress, c/s, abx plan/LOT Pre-HD vancomycin level as indicated F/u HD schedule/tolerance inpatient to enter maintenance doses   Height: 5\' 4"  (162.6 cm) Weight: 48.5 kg (106 lb 14.8 oz) IBW/kg (Calculated) : 59.2  Temp (24hrs), Avg:97.8 F (36.6 C), Min:97.6 F (36.4 C), Max:98.4 F (36.9 C)  Recent Labs  Lab 03/10/20 0144 03/10/20 1741 03/11/20 0339 03/12/20 0350 03/13/20 0411 03/14/20 0340 03/14/20 0700 03/15/20 0108 03/15/20 0920 03/16/20 0755 03/16/20 0756 03/16/20 1323  WBC 3.6*  --    < > 4.8 4.6 4.7  --  4.5  --  6.6  --   --   CREATININE 7.46*  --    < > 6.88*  --   --  11.20*  --  8.50*  --  9.58* 3.99*  LATICACIDVEN 1.1 2.2*  --   --   --   --   --   --   --   --   --   --    < > = values in this interval not displayed.    Estimated Creatinine Clearance: 12.8 mL/min (A) (by C-G formula based on SCr of 3.99 mg/dL (H)).    Allergies  Allergen Reactions  . Heparin Rash    Pork products   . Pork-Derived Products Rash  . Sulfa Antibiotics Rash     Thank you for involving pharmacy in this patient's care.  Renold Genta, PharmD, BCPS Clinical Pharmacist 03/16/2020 2:47 PM  **Pharmacist phone directory can be found on [email or web address] listed under [place]**

## 2020-03-17 LAB — CBC WITH DIFFERENTIAL/PLATELET
Abs Immature Granulocytes: 0.01 10*3/uL (ref 0.00–0.07)
Basophils Absolute: 0 10*3/uL (ref 0.0–0.1)
Basophils Relative: 1 %
Eosinophils Absolute: 0.8 10*3/uL — ABNORMAL HIGH (ref 0.0–0.5)
Eosinophils Relative: 15 %
HCT: 25.2 % — ABNORMAL LOW (ref 39.0–52.0)
Hemoglobin: 8.2 g/dL — ABNORMAL LOW (ref 13.0–17.0)
Immature Granulocytes: 0 %
Lymphocytes Relative: 19 %
Lymphs Abs: 1 10*3/uL (ref 0.7–4.0)
MCH: 31.7 pg (ref 26.0–34.0)
MCHC: 32.5 g/dL (ref 30.0–36.0)
MCV: 97.3 fL (ref 80.0–100.0)
Monocytes Absolute: 0.7 10*3/uL (ref 0.1–1.0)
Monocytes Relative: 13 %
Neutro Abs: 2.8 10*3/uL (ref 1.7–7.7)
Neutrophils Relative %: 52 %
Platelets: 97 10*3/uL — ABNORMAL LOW (ref 150–400)
RBC: 2.59 MIL/uL — ABNORMAL LOW (ref 4.22–5.81)
RDW: 18.2 % — ABNORMAL HIGH (ref 11.5–15.5)
WBC: 5.4 10*3/uL (ref 4.0–10.5)
nRBC: 0 % (ref 0.0–0.2)

## 2020-03-17 LAB — TYPE AND SCREEN
ABO/RH(D): O POS
Antibody Screen: POSITIVE
DAT, IgG: NEGATIVE
Unit division: 0
Unit division: 0

## 2020-03-17 LAB — BPAM RBC
Blood Product Expiration Date: 202111012359
Blood Product Expiration Date: 202111012359
ISSUE DATE / TIME: 202110191556
ISSUE DATE / TIME: 202110191556
Unit Type and Rh: 9500
Unit Type and Rh: 9500

## 2020-03-17 LAB — BASIC METABOLIC PANEL
Anion gap: 9 (ref 5–15)
BUN: 17 mg/dL (ref 8–23)
CO2: 29 mmol/L (ref 22–32)
Calcium: 9 mg/dL (ref 8.9–10.3)
Chloride: 97 mmol/L — ABNORMAL LOW (ref 98–111)
Creatinine, Ser: 5.51 mg/dL — ABNORMAL HIGH (ref 0.61–1.24)
GFR, Estimated: 10 mL/min — ABNORMAL LOW (ref >60)
Glucose, Bld: 87 mg/dL (ref 70–99)
Potassium: 3.5 mmol/L (ref 3.5–5.1)
Sodium: 135 mmol/L (ref 135–145)

## 2020-03-17 MED ORDER — AMLODIPINE BESYLATE 10 MG PO TABS
10.0000 mg | ORAL_TABLET | Freq: Every day | ORAL | 0 refills | Status: DC
Start: 2020-03-18 — End: 2020-09-27

## 2020-03-17 NOTE — Progress Notes (Signed)
PT Cancellation Note  Patient Details Name: Logan Martin MRN: [id number] DOB: 23-Jan-1956   Cancelled Treatment:    Reason Eval/Treat Not Completed: Other (comment).  Pt is declining PT and reports he is fine walking with his rollator.  Has son and grandson at home if help is needed.  DC order tomorrow if pt does not leave and is still feeling confident.   Ramond Dial 03/17/2020, 11:08 AM   Mee Hives, PT MS Acute Rehab Dept. Number: [place]

## 2020-03-17 NOTE — Progress Notes (Signed)
Discharge instructions (including medications) discussed with and copy provided to patient/caregiver 

## 2020-03-17 NOTE — Discharge Summary (Signed)
Physician Discharge Summary  Logan Martin XEN:[id number] DOB: 12/16/1955 DOA: 03/10/2020  PCP: Logan Beals, NP  Admit date: 03/10/2020 Discharge date: 03/17/2020  Admitted From: Home Disposition: Home  Recommendations for Outpatient Follow-up:  1. Follow up with PCP in 1 week with repeat CBC/BMP 2. Outpatient follow-up with vascular surgery.  Wound care as per vascular surgery recommendations 3. Outpatient follow-up with outpatient hemodialysis as scheduled 4. Follow up in ED if symptoms worsen or new appear   Home Health: No Equipment/Devices: None  Discharge Condition: Stable CODE STATUS: Full Diet recommendation: Heart healthy/renal hemodialysis diet with fluid restriction of up to 1200 cc a day  Brief/Interim Summary: 64/Mwith medical history significant ofESRD on home hemodialysis (4 times per week-MTTF)), coronary artery disease, chronic thrombocytopenia, anemia of chronic disease, hyperlipidemia, chronic back pain, hepatitis C, PVD, bladder cancer presented to emergency department for evaluation of right upper arm-AV fistula site, which was more swollen and had somedrainage, raising concern for infection -Patientwasafebrile, vascular surgery and nephrology consulted -He was started on vancomycin.  During the hospitalization, he underwent left IJ tunneled HD catheter placement and revision of right upper arm AV fistula with excision of ulcerated area and plication of fistula by vascular surgery on 03/15/2020. Vascular surgery has cleared the patient for discharge with outpatient follow-up with vascular surgery.  Nephrology has cleared the patient for discharge as well.  Outpatient hemodialysis has been arranged.  He will be discharged home today.  Outpatient follow-up with vascular surgery and hemodialysis and PCP.  Discharge Diagnoses:   Right arm AV fistula swelling -Infection versus hematoma, ultrasound with fluid collection, no fever or leukocytosis, blood  cultures remain negative,patient reportedchills and nausea few days prior to admission -Day #7 of IV vancomycin. per renal note, outpatient superficialcultures withstaph urealyticus andcorynebacterium species, its significance is unknown.  Cultures from admission have been negative so far.  Will not take any more antibiotics on discharge -Nephrology and vascular surgery following -Temporary HD catheter placed in IR on 03/11/2020 -Status post left IJ tunneled HD catheter placement and revision of right upper arm AV fistula with excision of ulcerated area and plication of fistula by vascular surgery on 03/15/2020.  Wound care as per vascular surgery recommendations.  Vascular surgery has cleared the patient for discharge with outpatient follow-up with vascular surgery. -Discharge patient home today.  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    Patient tolerated inpatient dialysis as per nephrology schedule. Nephrology has cleared the patient for discharge as well.  Outpatient hemodialysis has been arranged..  Anemia of chronic disease -Hemoglobin 6.8 on 03/15/2020.  Status post 1 unit packed red cells transfusion.  Hemoglobin stable at 8.2 morning.  Outpatient follow-up  Hypertension -Continue metoprolol, Imdur, amlodipine  Chronic thrombocytopenia -stable,outpatient follow-up  CAD/peripheral vascular disease -Continue aspirin, statin  Chronic lower back pain -On oxycodone at baseline.  Follow-up with PCP/pain management for refills.  Discharge Instructions  Discharge Instructions    Diet - low sodium heart healthy   Complete by: As directed    Fluid restriction of up to 1200 cc a day   Discharge wound care:   Complete by: As directed    As per vascular surgery recommendations   Increase activity slowly   Complete by: As directed      Allergies as of 03/17/2020      Reactions   Heparin Rash   Pork products    Pork-derived Products Rash   Sulfa  Antibiotics Rash      Medication List  STOP taking these medications   ibuprofen 800 MG tablet Commonly known as: ADVIL   PhosLo 667 MG capsule Generic drug: calcium acetate   Velphoro 500 MG chewable tablet Generic drug: sucroferric oxyhydroxide     TAKE these medications   amLODipine 10 MG tablet Commonly known as: NORVASC Take 1 tablet (10 mg total) by mouth daily. Start taking on: March 18, 2020   aspirin EC 81 MG tablet Take 81 mg by mouth at bedtime.   atorvastatin 40 MG tablet Commonly known as: LIPITOR Take 40 mg by mouth daily.   Auryxia 1 GM 210 MG(Fe) tablet Generic drug: ferric citrate Take 420 mg by mouth 3 (three) times daily.   calcitRIOL 0.5 MCG capsule Commonly known as: ROCALTROL Take 1.5 mcg by mouth daily.   isosorbide mononitrate 30 MG 24 hr tablet Commonly known as: IMDUR Take 30 mg by mouth daily.   lidocaine-prilocaine cream Commonly known as: EMLA Apply 1 application topically See admin instructions. Apply small amount to dialysis port (AVF) one hour before dialysis. Cover with occlusive dressing (saran wrap)   metoprolol succinate 50 MG 24 hr tablet Commonly known as: TOPROL-XL Take 50 mg by mouth daily.   Oxycodone HCl 10 MG Tabs Take 10 mg by mouth daily as needed (breakthrough pain). What changed: Another medication with the same name was removed. Continue taking this medication, and follow the directions you see here.   oxycodone 30 MG immediate release tablet Commonly known as: ROXICODONE Take 30 mg by mouth 3 (three) times daily. What changed: Another medication with the same name was removed. Continue taking this medication, and follow the directions you see here.            Discharge Care Instructions  (From admission, onward)         Start     Ordered   03/17/20 0000  Discharge wound care:       Comments: As per vascular surgery recommendations   03/17/20 0947          Follow-up Information    Angelia Mould, MD Follow up.   Specialties: Vascular Surgery, Cardiology Why: The office will contact the patient with follow up(sent) Contact information: [place] [address] [phone]        Logan Beals, NP. Schedule an appointment as soon as possible for a visit in 1 week(s).   Contact information: [address] [place] [address] [phone]              Allergies  Allergen Reactions  . Heparin Rash    Pork products   . Pork-Derived Products Rash  . Sulfa Antibiotics Rash    Consultations:  Nephrology/vascular surgery/IR   Procedures/Studies: DG Chest 2 View  Result Date: 03/10/2020 CLINICAL DATA:  Right upper extremity swelling.  Possible sepsis. EXAM: CHEST - 2 VIEW COMPARISON:  06/17/2018 FINDINGS: Mild cardiomegaly. Both lungs are clear. The visualized skeletal structures are unremarkable. IMPRESSION: Mild cardiomegaly without acute airspace disease. Electronically Signed   By: Ulyses Jarred M.D.   On: 03/10/2020 01:55   IR Fluoro Guide CV Line Left  Result Date: 03/11/2020 INDICATION: 64 year old male with history of end-stage renal disease and malfunctioning right upper extremity fistula. Presents for temporary hemodialysis access. EXAM: NON-TUNNELED CENTRAL VENOUS HEMODIALYSIS CATHETER PLACEMENT WITH ULTRASOUND AND FLUOROSCOPIC GUIDANCE COMPARISON:  None. MEDICATIONS: None FLUOROSCOPY TIME:  Two minutes, 0 seconds (26 mGy) COMPLICATIONS: None immediate. PROCEDURE: Informed written consent was obtained from the patient after a  discussion of the risks, benefits, and alternatives to treatment. Questions regarding the procedure were encouraged and answered. The left neck and chest were prepped with chlorhexidine in a sterile fashion, and a sterile drape was applied covering the operative field. Maximum barrier sterile technique with sterile gowns and gloves were used for the procedure. A timeout was performed prior to the  initiation of the procedure. After the overlying soft tissues were anesthetized, a small venotomy incision was created and a micropuncture kit was utilized to access the internal jugular vein. Real-time ultrasound guidance was utilized for vascular access including the acquisition of a permanent ultrasound image documenting patency of the accessed vessel. The microwire was utilized to measure appropriate catheter length. A J wire was advanced to the level of the IVC by using a 5 [place] Kumpe the catheter to navigate beyond the indwelling right innominate vein stent which terminates at the innominate confluence. Under fluoroscopic guidance, the venotomy was serially dilated, ultimately allowing placement of a 24 cm temporary Trialysis catheter with tip ultimately terminating within the superior aspect of the right atrium. Final catheter positioning was confirmed and documented with a spot radiographic image. The catheter aspirates and flushes normally. The catheter was flushed with appropriate volume sodium citrate dwells. The catheter exit site was secured with a 0-Prolene retention suture. A dressing was placed. The patient tolerated the procedure well without immediate post procedural complication. IMPRESSION: Successful placement of a left internal jugular approach 24 cm temporary dialysis catheter with tip terminating with in the superior aspect of the right atrium. The catheter is ready for immediate use. PLAN: This catheter may be converted to a tunneled dialysis catheter at a later date as indicated. Ruthann Cancer, MD [facility] Radiology Electronically Signed   By: Ruthann Cancer MD   On: 03/11/2020 13:04   IR US Guide Vasc Access Left  Result Date: 03/11/2020 INDICATION: 64 year old male with history of end-stage renal disease and malfunctioning right upper extremity fistula. Presents for temporary hemodialysis access. EXAM: NON-TUNNELED CENTRAL VENOUS  HEMODIALYSIS CATHETER PLACEMENT WITH ULTRASOUND AND FLUOROSCOPIC GUIDANCE COMPARISON:  None. MEDICATIONS: None FLUOROSCOPY TIME:  Two minutes, 0 seconds (26 mGy) COMPLICATIONS: None immediate. PROCEDURE: Informed written consent was obtained from the patient after a discussion of the risks, benefits, and alternatives to treatment. Questions regarding the procedure were encouraged and answered. The left neck and chest were prepped with chlorhexidine in a sterile fashion, and a sterile drape was applied covering the operative field. Maximum barrier sterile technique with sterile gowns and gloves were used for the procedure. A timeout was performed prior to the initiation of the procedure. After the overlying soft tissues were anesthetized, a small venotomy incision was created and a micropuncture kit was utilized to access the internal jugular vein. Real-time ultrasound guidance was utilized for vascular access including the acquisition of a permanent ultrasound image documenting patency of the accessed vessel. The microwire was utilized to measure appropriate catheter length. A J wire was advanced to the level of the IVC by using a 5 [place] Kumpe the catheter to navigate beyond the indwelling right innominate vein stent which terminates at the innominate confluence. Under fluoroscopic guidance, the venotomy was serially dilated, ultimately allowing placement of a 24 cm temporary Trialysis catheter with tip ultimately terminating within the superior aspect of the right atrium. Final catheter positioning was confirmed and documented with a spot radiographic image. The catheter aspirates and flushes normally. The catheter was flushed with appropriate volume sodium citrate dwells.  The catheter exit site was secured with a 0-Prolene retention suture. A dressing was placed. The patient tolerated the procedure well without immediate post procedural complication. IMPRESSION: Successful placement of a left internal jugular  approach 24 cm temporary dialysis catheter with tip terminating with in the superior aspect of the right atrium. The catheter is ready for immediate use. PLAN: This catheter may be converted to a tunneled dialysis catheter at a later date as indicated. Ruthann Cancer, MD [facility] Radiology Electronically Signed   By: Ruthann Cancer MD   On: 03/11/2020 13:04   DG CHEST PORT 1 VIEW  Result Date: 03/15/2020 CLINICAL DATA:  Dialysis catheter EXAM: PORTABLE CHEST 1 VIEW COMPARISON:  03/10/2020 FINDINGS: Right subclavian and brachiocephalic region stent again noted. Left-sided central venous catheter with tip projecting over the distal SVC region. No focal opacity or pleural effusion. Enlarged cardiomediastinal silhouette. No pneumothorax. Aortic atherosclerosis. IMPRESSION: Left-sided central venous catheter tip overlies the distal SVC region. No pneumothorax. Cardiomegaly. Electronically Signed   By: Donavan Foil M.D.   On: 03/15/2020 15:56   DG Fluoro Guide CV Line-No Report  Result Date: 03/15/2020 Fluoroscopy was utilized by the requesting physician.  No radiographic interpretation.   VAS US DUPLEX DIALYSIS ACCESS (AVF, AVG)  Result Date: 03/10/2020 DIALYSIS ACCESS Reason for Exam: Pain, swelling, concern for infection. Access Site: Right Upper Extremity. Comparison Study: 08/10/13 Performing Technologist: Sharion Dove RVS  Examination Guidelines: A complete evaluation includes B-mode imaging, spectral Doppler, color Doppler, and power Doppler as needed of all accessible portions of each vessel. Unilateral testing is considered an integral part of a complete examination. Limited examinations for reoccurring indications may be performed as noted.  Findings:    Summary: Well circumscribed heterogenous area adjacent to the graft. No perigraft fluid noted.  *See table(s) above for measurements and observations.  Diagnosing physician: Servando Snare MD  Electronically signed by Servando Snare MD on 03/10/2020 at 5:38:45 PM.   --------------------------------------------------------------------------------   Final        Subjective: Patient seen and examined at bedside.  Poor historian.  Denies any worsening shortness breath, nausea or vomiting.  No overnight fevers.  Feels okay to go home today.  Discharge Exam: Vitals:   03/17/20 0432 03/17/20 0837  BP: (!) 145/62 (!) 154/62  Pulse: 77 86  Resp: 18 16  Temp: 98.4 F (36.9 C) 98 F (36.7 C)  SpO2: 100% 95%    General: Pt is alert, awake, not in acute distress.  Looks chronically ill.  Poor historian Cardiovascular: rate controlled, S1/S2 + Respiratory: bilateral decreased breath sounds at bases with some scattered crackles Abdominal: Soft, NT, ND, bowel sounds + Extremities: Trace lower extremity edema; no cyanosis.  Right arm dressing present    The results of significant diagnostics from this hospitalization (including imaging, microbiology, ancillary and laboratory) are listed below for reference.     Microbiology: Recent Results (from the past 240 hour(s))  Culture, blood (Routine x 2)     Status: None   Collection Time: 03/10/20  1:30 AM   Specimen: BLOOD LEFT ARM  Result Value Ref Range Status   Specimen Description BLOOD LEFT ARM  Final   Special Requests   Final    BOTTLES DRAWN AEROBIC AND ANAEROBIC Blood Culture adequate volume   Culture   Final    NO GROWTH 5 DAYS Performed at [facility], [address] [place], [place] [address]    Report Status 03/15/2020 FINAL  Final  Culture, blood (Routine x 2)     Status: None   Collection Time: 03/10/20  1:46 AM   Specimen: BLOOD LEFT HAND  Result Value Ref Range Status   Specimen Description BLOOD LEFT HAND  Final   Special Requests   Final    AEROBIC BOTTLE ONLY Blood Culture results may not be optimal due to an excessive volume of blood received in culture bottles   Culture   Final    NO GROWTH 5  DAYS Performed at [facility], [place] [address] [place], [place] [address]    Report Status 03/15/2020 FINAL  Final  Respiratory Panel by RT PCR (Flu A&B, Covid) - Nasopharyngeal Swab     Status: None   Collection Time: 03/10/20 11:41 AM   Specimen: Nasopharyngeal Swab  Result Value Ref Range Status   SARS Coronavirus 2 by RT PCR NEGATIVE NEGATIVE Final    Comment: (NOTE) SARS-CoV-2 target nucleic acids are NOT DETECTED.  The SARS-CoV-2 RNA is generally detectable in upper respiratoy specimens during the acute phase of infection. The lowest concentration of SARS-CoV-2 viral copies this assay can detect is 131 copies/mL. A negative result does not preclude SARS-Cov-2 infection and should not be used as the sole basis for treatment or other patient management decisions. A negative result may occur with  improper specimen collection/handling, submission of specimen other than nasopharyngeal swab, presence of viral mutation(s) within the areas targeted by this assay, and inadequate number of viral copies (<131 copies/mL). A negative result must be combined with clinical observations, patient history, and epidemiological information. The expected result is Negative.  Fact Sheet for Patients:  PinkCheek.be  Fact Sheet for Healthcare Providers:  GravelBags.it  This test is no t yet approved or cleared by the [place] FDA and  has been authorized for detection and/or diagnosis of SARS-CoV-2 by FDA under an Emergency Use Authorization (EUA). This EUA will remain  in effect (meaning this test can be used) for the duration of the COVID-19 declaration under Section 564(b)(1) of the Act, 21 U.S.C. section 360bbb-3(b)(1), unless the authorization is terminated or revoked sooner.     Influenza A by PCR NEGATIVE NEGATIVE Final   Influenza B by PCR NEGATIVE NEGATIVE Final    Comment: (NOTE) The Xpert Xpress  SARS-CoV-2/FLU/RSV assay is intended as an aid in  the diagnosis of influenza from Nasopharyngeal swab specimens and  should not be used as a sole basis for treatment. Nasal washings and  aspirates are unacceptable for Xpert Xpress SARS-CoV-2/FLU/RSV  testing.  Fact Sheet for Patients: PinkCheek.be  Fact Sheet for Healthcare Providers: GravelBags.it  This test is not yet approved or cleared by the [place] FDA and  has been authorized for detection and/or diagnosis of SARS-CoV-2 by  FDA under an Emergency Use Authorization (EUA). This EUA will remain  in effect (meaning this test can be used) for the duration of the  Covid-19 declaration under Section 564(b)(1) of the Act, 21  U.S.C. section 360bbb-3(b)(1), unless the authorization is  terminated or revoked. Performed at [facility], [place] [address], [place], [place] [address]   Surgical pcr screen     Status: None   Collection Time: 03/15/20  6:53 AM   Specimen: Nasal Mucosa; Nasal Swab  Result Value Ref Range Status   MRSA, PCR NEGATIVE NEGATIVE Final   Staphylococcus aureus NEGATIVE NEGATIVE Final    Comment: (NOTE) The Xpert SA Assay (FDA approved for NASAL specimens in patients 52 years of age  and older), is one component of a comprehensive surveillance program. It is not intended to diagnose infection nor to guide or monitor treatment. Performed at [facility], [place] [address], [place], [place] [address]      Labs: BNP (last 3 results) No results for input(s): BNP in the last 8760 hours. Basic Metabolic Panel: Recent Labs  Lab 03/10/20 1741 03/11/20 0339 03/12/20 0350 03/12/20 0350 03/14/20 0700 03/14/20 0700 03/15/20 0920 03/15/20 1331 03/16/20 0756 03/16/20 1323 03/17/20 0205  NA  --    < > 138   < > 135   < > 136 141 136 137 135  K  --    < > 4.5   < > 4.9   < > 4.6 4.7 5.1 3.3* 3.5  CL  --    < > 97*   < > 94*  --  97*  --  99  96* 97*  CO2  --    < > 29  --  29  --   --   --  24 27 29   GLUCOSE  --    < > 83   < > 99  --  87  --  95 110* 87  BUN  --    < > 33*   < > 57*  --  31*  --  40* 12 17  CREATININE  --    < > 6.88*   < > 11.20*  --  8.50*  --  9.58* 3.99* 5.51*  CALCIUM  --    < > 9.8  --  10.1  --   --   --  9.5 8.6* 9.0  MG 1.9  --   --   --   --   --   --   --   --   --   --   PHOS  --   --   --   --  2.9  --   --   --  4.5 2.3*  --    < > = values in this interval not displayed.   Liver Function Tests: Recent Labs  Lab 03/11/20 0339 03/14/20 0700 03/16/20 0756 03/16/20 1323  AST 15  --   --   --   ALT 12  --   --   --   ALKPHOS 62  --   --   --   BILITOT 0.7  --   --   --   PROT 6.4*  --   --   --   ALBUMIN 3.3* 3.4* 3.8 4.0   No results for input(s): LIPASE, AMYLASE in the last 168 hours. No results for input(s): AMMONIA in the last 168 hours. CBC: Recent Labs  Lab 03/13/20 0411 03/13/20 0411 03/14/20 0340 03/14/20 0340 03/15/20 0108 03/15/20 0920 03/15/20 1331 03/16/20 0755 03/17/20 0205  WBC 4.6  --  4.7  --  4.5  --   --  6.6 5.4  NEUTROABS  --   --   --   --   --   --   --   --  2.8  HGB 8.6*   < > 7.8*   < > 7.3* 8.5* 6.8* 9.1* 8.2*  HCT 27.9*   < > 24.9*   < > 23.8* 25.0* 20.0* 27.9* 25.2*  MCV 96.5  --  96.9  --  98.3  --   --  94.3 97.3  PLT 106*  --  123*  --  110*  --   --  128* 97*   < > = values in this interval not displayed.   Cardiac Enzymes: No results for input(s): CKTOTAL, CKMB, CKMBINDEX, TROPONINI in the last 168 hours. BNP: Invalid input(s): POCBNP CBG: No results for input(s): GLUCAP in the last 168 hours. D-Dimer No results for input(s): DDIMER in the last 72 hours. Hgb A1c No results for input(s): HGBA1C in the last 72 hours. Lipid Profile No results for input(s): CHOL, HDL, LDLCALC, TRIG, CHOLHDL, LDLDIRECT in the last 72 hours. Thyroid function studies No results for input(s): TSH, T4TOTAL, T3FREE, THYROIDAB in the last 72 hours.  Invalid  input(s): FREET3 Anemia work up No results for input(s): VITAMINB12, FOLATE, FERRITIN, TIBC, IRON, RETICCTPCT in the last 72 hours. Urinalysis    Component Value Date/Time   COLORURINE STRAW (A) 03/28/2018 0300   APPEARANCEUR CLEAR 03/28/2018 0300   LABSPEC 1.000 (L) 03/28/2018 0300   PHURINE 6.0 03/28/2018 0300   GLUCOSEU NEGATIVE 03/28/2018 0300   HGBUR NEGATIVE 03/28/2018 0300   BILIRUBINUR NEGATIVE 03/28/2018 0300   KETONESUR NEGATIVE 03/28/2018 0300   PROTEINUR NEGATIVE 03/28/2018 0300   NITRITE NEGATIVE 03/28/2018 0300   LEUKOCYTESUR NEGATIVE 03/28/2018 0300   Sepsis Labs Invalid input(s): PROCALCITONIN,  WBC,  LACTICIDVEN Microbiology Recent Results (from the past 240 hour(s))  Culture, blood (Routine x 2)     Status: None   Collection Time: 03/10/20  1:30 AM   Specimen: BLOOD LEFT ARM  Result Value Ref Range Status   Specimen Description BLOOD LEFT ARM  Final   Special Requests   Final    BOTTLES DRAWN AEROBIC AND ANAEROBIC Blood Culture adequate volume   Culture   Final    NO GROWTH 5 DAYS Performed at [facility], [address] [place], [place] [address]    Report Status 03/15/2020 FINAL  Final  Culture, blood (Routine x 2)     Status: None   Collection Time: 03/10/20  1:46 AM   Specimen: BLOOD LEFT HAND  Result Value Ref Range Status   Specimen Description BLOOD LEFT HAND  Final   Special Requests   Final    AEROBIC BOTTLE ONLY Blood Culture results may not be optimal due to an excessive volume of blood received in culture bottles   Culture   Final    NO GROWTH 5 DAYS Performed at [facility], [place] [address] [place], [place] [address]    Report Status 03/15/2020 FINAL  Final  Respiratory Panel by RT PCR (Flu A&B, Covid) - Nasopharyngeal Swab     Status: None   Collection Time: 03/10/20 11:41 AM   Specimen: Nasopharyngeal Swab  Result Value Ref Range Status   SARS Coronavirus 2 by RT PCR NEGATIVE NEGATIVE Final    Comment:  (NOTE) SARS-CoV-2 target nucleic acids are NOT DETECTED.  The SARS-CoV-2 RNA is generally detectable in upper respiratoy specimens during the acute phase of infection. The lowest concentration of SARS-CoV-2 viral copies this assay can detect is 131 copies/mL. A negative result does not preclude SARS-Cov-2 infection and should not be used as the sole basis for treatment or other patient management decisions. A negative result may occur with  improper specimen collection/handling, submission of specimen other than nasopharyngeal swab, presence of viral mutation(s) within the areas targeted by this assay, and inadequate number of viral copies (<131 copies/mL). A negative result must be combined with clinical observations, patient history, and epidemiological information. The expected result is Negative.  Fact Sheet for Patients:  PinkCheek.be  Fact Sheet  for Healthcare Providers:  GravelBags.it  This test is no t yet approved or cleared by the [place] and  has been authorized for detection and/or diagnosis of SARS-CoV-2 by FDA under an Emergency Use Authorization (EUA). This EUA will remain  in effect (meaning this test can be used) for the duration of the COVID-19 declaration under Section 564(b)(1) of the Act, 21 U.S.C. section 360bbb-3(b)(1), unless the authorization is terminated or revoked sooner.     Influenza A by PCR NEGATIVE NEGATIVE Final   Influenza B by PCR NEGATIVE NEGATIVE Final    Comment: (NOTE) The Xpert Xpress SARS-CoV-2/FLU/RSV assay is intended as an aid in  the diagnosis of influenza from Nasopharyngeal swab specimens and  should not be used as a sole basis for treatment. Nasal washings and  aspirates are unacceptable for Xpert Xpress SARS-CoV-2/FLU/RSV  testing.  Fact Sheet for Patients: PinkCheek.be  Fact Sheet for Healthcare  Providers: GravelBags.it  This test is not yet approved or cleared by the [place] FDA and  has been authorized for detection and/or diagnosis of SARS-CoV-2 by  FDA under an Emergency Use Authorization (EUA). This EUA will remain  in effect (meaning this test can be used) for the duration of the  Covid-19 declaration under Section 564(b)(1) of the Act, 21  U.S.C. section 360bbb-3(b)(1), unless the authorization is  terminated or revoked. Performed at [facility], [place] [address] [place], [place] [address]   Surgical pcr screen     Status: None   Collection Time: 03/15/20  6:53 AM   Specimen: Nasal Mucosa; Nasal Swab  Result Value Ref Range Status   MRSA, PCR NEGATIVE NEGATIVE Final   Staphylococcus aureus NEGATIVE NEGATIVE Final    Comment: (NOTE) The Xpert SA Assay (FDA approved for NASAL specimens in patients 8 years of age and older), is one component of a comprehensive surveillance program. It is not intended to diagnose infection nor to guide or monitor treatment. Performed at [facility], [place] [address] [place], [place] [address]      Time coordinating discharge: 35 minutes  SIGNED:   Aline August, MD  [facility] 03/17/2020, 9:48 AM

## 2020-03-17 NOTE — Progress Notes (Signed)
Navigator followed up with patient at bedside to see if he was able to reach anyone at [employer]. He states he was on hold for 20 minutes and hung up at 5:00pm as instructed. Navigator suggested he call again today. He thinks he may still be enrolled with [employer] because he rode with them a few years ago. Navigator doubtful and called [employer] to check on his enrollment. He is not currently enrolled and will need to call Medicaid to enroll in transportation. He states understanding. He will use [employer] (already enrolled) until he can get Medicaid Transportation set up. Navigator scheduled his ride for Saturday (first clinic treatment). Pick up window between 5:00-5:30am from home to clinic and 10:45-11:15am from clinic to home after treatment. Patient appreciative.  Alphonzo Cruise, [place] [facility] Navigator [phone]

## 2020-03-17 NOTE — Plan of Care (Signed)
  Problem: Health Behavior/Discharge Planning: Goal: Ability to manage health-related needs will improve Outcome: Adequate for Discharge   

## 2020-03-17 NOTE — Progress Notes (Signed)
[facility] Progress Note   Subjective:  Seen in room - arm pain resolved. Feels ready for discharge after transportation sorted. Will be going to HD at [facility] clinic on TTS schedule while has [facility] - will start there on Saturday. No dyspnea.  Objective Vitals:   03/16/20 1657 03/16/20 2051 03/17/20 0432 03/17/20 0837  BP: (!) 149/58 (!) 131/54 (!) 145/62 (!) 154/62  Pulse: 78 82 77 86  Resp: 18 18 18 16   Temp: 97.8 F (36.6 C) 99.7 F (37.6 C) 98.4 F (36.9 C) 98 F (36.7 C)  TempSrc: Oral Oral Oral Oral  SpO2: 98%  100% 95%  Weight:  48.5 kg    Height:       Physical Exam General:Well appearing man, NAD Heart:RRR; no murmur Lungs:CTAB; no rales Abdomen:soft, non-tender Extremities:No LE edema Dialysis Access:TDC in L chest, bandaged RUE s/p AVF repair/revision  Additional Objective Labs: Basic Metabolic Panel: Recent Labs  Lab 03/14/20 0700 03/15/20 0920 03/16/20 0756 03/16/20 1323 03/17/20 0205  NA 135   < > 136 137 135  K 4.9   < > 5.1 3.3* 3.5  CL 94*   < > 99 96* 97*  CO2 29  --  24 27 29   GLUCOSE 99   < > 95 110* 87  BUN 57*   < > 40* 12 17  CREATININE 11.20*   < > 9.58* 3.99* 5.51*  CALCIUM 10.1  --  9.5 8.6* 9.0  PHOS 2.9  --  4.5 2.3*  --    < > = values in this interval not displayed.   Liver Function Tests: Recent Labs  Lab 03/11/20 0339 03/11/20 0339 03/14/20 0700 03/16/20 0756 03/16/20 1323  AST 15  --   --   --   --   ALT 12  --   --   --   --   ALKPHOS 62  --   --   --   --   BILITOT 0.7  --   --   --   --   PROT 6.4*  --   --   --   --   ALBUMIN 3.3*   < > 3.4* 3.8 4.0   < > = values in this interval not displayed.   CBC: Recent Labs  Lab 03/13/20 0411 03/13/20 0411 03/14/20 0340 03/14/20 0340 03/15/20 0108 03/15/20 0920 03/15/20 1331 03/16/20 0755 03/17/20 0205  WBC 4.6   < > 4.7   < > 4.5  --   --  6.6 5.4  NEUTROABS  --   --   --   --   --   --   --   --  2.8  HGB 8.6*   < > 7.8*   < > 7.3*   < > 6.8*  9.1* 8.2*  HCT 27.9*   < > 24.9*   < > 23.8*   < > 20.0* 27.9* 25.2*  MCV 96.5  --  96.9  --  98.3  --   --  94.3 97.3  PLT 106*   < > 123*   < > 110*  --   --  128* 97*   < > = values in this interval not displayed.   Blood Culture    Component Value Date/Time   SDES BLOOD LEFT HAND 03/10/2020 0146   SPECREQUEST  03/10/2020 0146    AEROBIC BOTTLE ONLY Blood Culture results may not be optimal due to an excessive volume of blood received in culture  bottles   CULT  03/10/2020 0146    NO GROWTH 5 DAYS Performed at [facility], [place] [address] [place], [place] [address]    REPTSTATUS 03/15/2020 FINAL 03/10/2020 0146    Medications: . anticoagulant sodium citrate     . amLODipine  10 mg Oral Daily  . aspirin EC  81 mg Oral QHS  . atorvastatin  40 mg Oral Daily  . Chlorhexidine Gluconate Cloth  6 each Topical Daily  . ferric citrate  420 mg Oral TID with meals  . isosorbide mononitrate  30 mg Oral Daily  . metoprolol succinate  50 mg Oral Daily  . vancomycin  500 mg Intravenous Q M,W,F-HD    Dialysis Orders: Home HD NxStage MTTS EDW 50 kg  OP Labs 10/7: Hgb 9.8, K 7.1!, Ca 10.7, P 5.3, PTH 209  OP HD cultures sent from arm are growing Staph urealyticus, R to methicillin, S to vanc  Problem/Plan: 1. 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 BCx 10/14 negative. OP wound Cx grew Staph urealyticus + Corynebactrium - sensitive to Vanc which he has been getting here. 2. ESRD: On home HD (NxStage). Changed to in-center temporarily while catheter in use, until able to use AVF again (this is due to infectious risk) -> will be at [facility] on TTS scxhedule - 6:30a on-time. To sign paperwork tomorrow and start Saturday. Finalizing transportation with SW. 3. BP/volume: BP controlled, no edema on exam. 4. Anemia: Hgb 8.2 -Received Aranesp 200 on 10/13 - to give next HD. S/p 2U PRBCs 07/37. 5. Metabolic bone disease: CorrCa high,  VDRA on hold. Continue Auryxia as binder.  Veneta Penton, PA-C 03/17/2020, 10:52 AM  [employer]

## 2020-03-17 NOTE — Discharge Instructions (Signed)
   [facility]  Discharge Instructions  AV Fistula or Graft Surgery for Dialysis Access  Please refer to the following instructions for your post-procedure care. Your surgeon or physician assistant will discuss any changes with you.  Activity  You may drive the day following your surgery, if you are comfortable and no longer taking prescription pain medication. Resume full activity as the soreness in your incision resolves.  Bathing/Showering  You may shower after you go home. Keep your incision dry for 48 hours. Do not soak in a bathtub, hot tub, or swim until the incision heals completely. You may not shower if you have a hemodialysis catheter.  Incision Care  Clean your incision with mild soap and water after 48 hours. Pat the area dry with a clean towel. You do not need a bandage unless otherwise instructed. Do not apply any ointments or creams to your incision. You may have skin glue on your incision. Do not peel it off. It will come off on its own in about one week. Your arm may swell a bit after surgery. To reduce swelling use pillows to elevate your arm so it is above your heart. Your doctor will tell you if you need to lightly wrap your arm with an ACE bandage.  Diet  Resume your normal diet. There are not special food restrictions following this procedure. In order to heal from your surgery, it is CRITICAL to get adequate nutrition. Your body requires vitamins, minerals, and protein. Vegetables are the best source of vitamins and minerals. Vegetables also provide the perfect balance of protein. Processed food has little nutritional value, so try to avoid this.  Medications  Resume taking all of your medications. If your incision is causing pain, you may take over-the counter pain relievers such as acetaminophen (Tylenol). If you were prescribed a stronger pain medication, please be aware these medications can cause nausea and constipation. Prevent  nausea by taking the medication with a snack or meal. Avoid constipation by drinking plenty of fluids and eating foods with high amount of fiber, such as fruits, vegetables, and grains.  Do not take Tylenol if you are taking prescription pain medications.  Follow up Your surgeon may want to see you in the office following your access surgery. If so, this will be arranged at the time of your surgery.  Please call us immediately for any of the following conditions:  . Increased pain, redness, drainage (pus) from your incision site . Fever of 101 degrees or higher . Severe or worsening pain at your incision site . Hand pain or numbness. .  Reduce your risk of vascular disease:  . Stop smoking. If you would like help, call [facility] at 1-800-QUIT-NOW 910-640-6610) or [place] at [phone]  . Manage your cholesterol . Maintain a desired weight . Control your diabetes . Keep your blood pressure down  Dialysis  It will take several weeks to several months for your new dialysis access to be ready for use. Your surgeon will determine when it is okay to use it. Your nephrologist will continue to direct your dialysis. You can continue to use your Permcath until your new access is ready for use.   Martin/21/2021 Logan Martin [id number] Logan Martin, Logan Martin  Surgeon(s): Early, Arvilla Meres, MD  Procedure(s): RIGHT ARM ARTERIOVENOUS FISTULA REVISON INSERTION OF Left Internal Jugular DIALYSIS CATHETER  If you have any questions, please call the office at [phone].

## 2020-03-17 NOTE — Care Management Important Message (Signed)
Important Message  Patient Details  Name: Logan Martin MRN: [id number] Date of Birth: Dec 02, 1955   Medicare Important Message Given:  Yes  IM'S MAILED TO [facility]   Memory Argue 03/17/2020, 11:52 AM

## 2020-03-18 ENCOUNTER — Telehealth: Payer: Self-pay | Admitting: Nephrology

## 2020-03-18 DIAGNOSIS — R52 Pain, unspecified: Secondary | ICD-10-CM | POA: Insufficient documentation

## 2020-03-18 DIAGNOSIS — Z87892 Personal history of anaphylaxis: Secondary | ICD-10-CM | POA: Insufficient documentation

## 2020-03-18 DIAGNOSIS — T7840XS Allergy, unspecified, sequela: Secondary | ICD-10-CM | POA: Insufficient documentation

## 2020-03-18 NOTE — Telephone Encounter (Signed)
Transition of care contact from inpatient facility  Date of Discharge: 03/17/20  Date of Contact: 03/18/20 Method of contact: phone Talked with: patient  Patient contact to discuss transition of care from recent inpatient hospitalization. Pateint was admitted to [facility] from  10/14-10/21/21 with the diagnosis of right AVF infection - s//p revision, abtx and short term [facility]  Medication changes were reviewed.  Reinforced stopping phoslo and calcitriol for now due to ^ corr Ca - we will resume at outpatient dialysis as indicated  Patient will follow up at outpatient dialysis on 03/19/20 - unable to go by today to sign papers due to transporation issues; SW will be sure he is ok to sign papers tomorrow -  Other follow up needs: none   Amalia Hailey, PA-C [facility] Pager:  [phone]

## 2020-03-29 ENCOUNTER — Other Ambulatory Visit: Payer: Self-pay

## 2020-03-29 ENCOUNTER — Ambulatory Visit (INDEPENDENT_AMBULATORY_CARE_PROVIDER_SITE_OTHER): Payer: Self-pay | Admitting: Physician Assistant

## 2020-03-29 ENCOUNTER — Telehealth: Payer: Self-pay

## 2020-03-29 VITALS — BP 163/91 | HR 102 | Temp 98.2°F | Resp 20 | Ht 64.0 in | Wt 110.7 lb

## 2020-03-29 DIAGNOSIS — N186 End stage renal disease: Secondary | ICD-10-CM

## 2020-03-29 DIAGNOSIS — Z992 Dependence on renal dialysis: Secondary | ICD-10-CM

## 2020-03-29 NOTE — Progress Notes (Signed)
  POST OPERATIVE OFFICE NOTE    CC:  F/u for surgery  HPI:  This is a 64 y.o. male who is s/p #1 left IJ tunneled hemodialysis catheter, #2 revision of right upper arm AV fistula with excision of ulcerated area and plication of fistula on 03/15/2020 by Dr. Donnetta Hutching.    Pt returns today for follow up with left neck and arm edema.  He denise SOB, difficulty swallowing or loss of motor and sensation in his left UE.  He states the anterior neck edema dis go down after HD some.  He is still able to perform ADLs without problems.     Allergies  Allergen Reactions  . Heparin Rash    Pork products   . Pork-Derived Products Rash  . Sulfa Antibiotics Rash    Current Outpatient Medications  Medication Sig Dispense Refill  . amLODipine (NORVASC) 10 MG tablet Take 1 tablet (10 mg total) by mouth daily. 30 tablet 0  . aspirin EC 81 MG tablet Take 81 mg by mouth at bedtime.     [name] atorvastatin (LIPITOR) 40 MG tablet Take 40 mg by mouth daily.    Lorin Picket 1 GM 210 MG(Fe) tablet Take 420 mg by mouth 3 (three) times daily.    . calcitRIOL (ROCALTROL) 0.5 MCG capsule Take 1.5 mcg by mouth daily.  3  . isosorbide mononitrate (IMDUR) 30 MG 24 hr tablet Take 30 mg by mouth daily.     [name] lidocaine-prilocaine (EMLA) cream Apply 1 application topically See admin instructions. Apply small amount to dialysis port (AVF) one hour before dialysis. Cover with occlusive dressing (saran wrap)  6  . metoprolol succinate (TOPROL-XL) 50 MG 24 hr tablet Take 50 mg by mouth daily.    [name] oxycodone (ROXICODONE) 30 MG immediate release tablet Take 30 mg by mouth 3 (three) times daily.     . Oxycodone HCl 10 MG TABS Take 10 mg by mouth daily as needed (breakthrough pain).   0   No current facility-administered medications for this visit.     ROS:  See HPI  Physical Exam:    Incision:  Healing well with minimal separation of the right arm incision.  No sign of infection. The left side of his neck and anterior neck have  soft mod-minimal edema.  Very minimal left arm edema.    Assessment/Plan:  This is a 64 y.o. 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 Oneida Alar on 06/22/2016.  He does have central venous occlusion symptoms, but he is currently not in dager.  His arm is minimally swollen, he is not having SOB and the catheter is working for HD without problems.      The right AV fistula may be accessed in 2 weeks and then once it is functioning he can have the [facility] removed.  Continue to use TDC on the left for now.  [name] PA-C [facility] [phone]  Clinic MD:  Carlis Abbott

## 2020-03-29 NOTE — Telephone Encounter (Signed)
Dialysis center called. Dr. Carolin Sicks noted some swelling and a dilated vein at [facility] site and wants patient to been today. He is post op [facility] and fistula placement from 03/15/20. Put him on the schedule to see PA today at 1300. Patient aware

## 2020-04-14 ENCOUNTER — Telehealth: Payer: Self-pay

## 2020-04-14 NOTE — Telephone Encounter (Signed)
Patient called to ask about accessing his fistula. Per PA note, it can be used currently. Advised patient if the fistula was functioning well and could be used, to call us back for cath removal. Patient verbalized understanding.

## 2020-04-18 ENCOUNTER — Telehealth: Payer: Self-pay

## 2020-04-18 NOTE — Telephone Encounter (Signed)
Spoke with Tiffany at [facility]. Patient's AV fistula has only been cannulated twice since approved to be accessed on last week. TDC is to remain in for now. Nurse stated she would contact patient and relay this information to him and to report to dialysis as planned on Wednesday.

## 2020-04-18 NOTE — Telephone Encounter (Signed)
Patient needs TDC taken out. Wednesday 11/24 works for him, left message with [employer].

## 2020-04-26 ENCOUNTER — Telehealth: Payer: Self-pay

## 2020-04-26 ENCOUNTER — Other Ambulatory Visit: Payer: Self-pay

## 2020-04-26 NOTE — Telephone Encounter (Signed)
Placed orders for [facility] removal

## 2020-04-26 NOTE — Telephone Encounter (Signed)
Patient called from HD center c/o swelling in L upper arm and neck and some sob. He did not sound sob on the phone and spoke in full sentences without catching his breath. He said dialysis helps the swelling a little bit. He would like the catheter out. Confirmed that it was okay with Larina Earthly, and scheduled patient for catheter removal tomorrow. Patient aware. Patient states he will go to the ED prior to the removal time if he has difficulty breathing.

## 2020-04-27 ENCOUNTER — Other Ambulatory Visit: Payer: Self-pay

## 2020-04-27 ENCOUNTER — Ambulatory Visit (HOSPITAL_COMMUNITY)
Admission: RE | Admit: 2020-04-27 | Discharge: 2020-04-27 | Disposition: A | Discharge status: Home / Self Care | Payer: Medicare Other | Source: Ambulatory Visit | Attending: Vascular Surgery | Admitting: Vascular Surgery

## 2020-04-27 DIAGNOSIS — N185 Chronic kidney disease, stage 5: Secondary | ICD-10-CM | POA: Diagnosis not present

## 2020-04-27 DIAGNOSIS — N186 End stage renal disease: Secondary | ICD-10-CM | POA: Insufficient documentation

## 2020-04-27 DIAGNOSIS — I120 Hypertensive chronic kidney disease with stage 5 chronic kidney disease or end stage renal disease: Secondary | ICD-10-CM | POA: Insufficient documentation

## 2020-04-27 DIAGNOSIS — Z452 Encounter for adjustment and management of vascular access device: Secondary | ICD-10-CM | POA: Diagnosis not present

## 2020-04-27 MED ORDER — LIDOCAINE HCL (PF) 2 % IJ SOLN
INTRAMUSCULAR | Status: AC
  Filled 2020-04-27: qty 10

## 2020-04-27 NOTE — Progress Notes (Signed)
[facility] SHORT STAY H&P  CC: ESRD   HPI: Logan Martin is a 64 y.o. male who has been on HD through  functioning Hemodialysis access in the right upper extremity. They are here for HD catheter removal. Pt. denies signs of steal syndrome.  Past Medical History:  Diagnosis Date  . Alcohol abuse    quit date in 1995  . Anemia   . Cancer (Logan Martin)    bladder  . Chronic low back pain   . 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 ESRD (end stage renal disease) (Logan Martin)    Home Dialysis  . 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 Started HD in [place] Martin, [place] Martin.  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  On NxStage home hemo since 2010. Using RUA AVF.    Logan Martin Hearing aid worn    B/L  . Hepatitis C    s/p treatment with [facility]avyret  . History of blood transfusion 2007 X 1  . History of kidney stones   . Hx of constipation   . Hypertension   . Left inguinal hernia   . Peripheral vascular disease ([place])   . Pulmonary embolism Logan Martin) Feb 2012   Treated with coumadin x 1 year  . Wears glasses     Family Hx Family History  Problem Relation Age of Onset  . Cancer Mother   . Diabetes Mother   . Hypertension Mother   . Deep vein thrombosis Daughter     Social HX Social History   Tobacco Use  . Smoking status: Former Smoker    Packs/day: 1.00    Years: 5.00    Pack years: 5.00    Types: Cigarettes    Quit date: 05/28/1993    Years since quitting: 26.9  . Smokeless tobacco: Never Used  Vaping Use  . Vaping Use: Never used  Substance Use Topics  . Alcohol use: Not Currently    Comment:  "nothing since 1995"  . Drug use: Not Currently    Types: Marijuana, Cocaine    Comment: denies 03/02/19    Allergies Allergies  Allergen Reactions  . Heparin Rash    Pork products   . Pork-Derived Products Rash  . Sulfa Antibiotics Rash    Medications Current Outpatient Medications  Medication Sig Dispense Refill  .  amLODipine (NORVASC) 10 MG tablet Take 1 tablet (10 mg total) by mouth daily. 30 tablet 0  . aspirin EC 81 MG tablet Take 81 mg by mouth at bedtime.     Logan Martin atorvastatin (LIPITOR) 40 MG tablet Take 40 mg by mouth daily.    Lorin Picket 1 GM 210 MG(Fe) tablet Take 420 mg by mouth 3 (three) times daily.    . calcitRIOL (ROCALTROL) 0.5 MCG capsule Take 1.5 mcg by mouth daily.  3  . isosorbide mononitrate (IMDUR) 30 MG 24 hr tablet Take 30 mg by mouth daily.     Logan Martin lidocaine-prilocaine (EMLA) cream Apply 1 application topically See admin instructions. Apply small amount to dialysis port (AVF) one hour before dialysis. Cover with occlusive dressing (saran wrap)  6  . metoprolol succinate (TOPROL-XL) 50 MG 24 hr tablet Take 50 mg by mouth daily.    Logan Martin oxycodone (ROXICODONE) 30 MG immediate release tablet Take 30 mg by mouth 3 (three) times daily.     . Oxycodone HCl 10 MG TABS Take 10 mg by mouth daily as needed (breakthrough  pain).   0   Current Facility-Administered Medications  Medication Dose Route Frequency Provider Last Rate Last Admin  . lidocaine HCl (PF) (XYLOCAINE) 2 % injection             Labs COAG Lab Results  Component Value Date   INR 1.1 03/10/2020   INR 1.07 11/21/2017   INR 1.10 12/06/2010   No results found for: PTT  PHYSICAL EXAM  Vitals:   04/27/20 0913  BP: (!) 160/91  Pulse: 91  Resp: 18  Temp: (!) 96.8 F (36 C)  SpO2: 100%    General:  WDWN in NAD HENT: WNL Eyes: Pupils equal Pulmonary: normal non-labored breathing  Cardiac: RRR, Skin: normal, no cyanosis, jaundice, pallor or bruising Vascular Exam/Pulses: 2+  pulses in right upper extremity. Extremities without ischemic changes, no Gangrene , no cellulitis; no open wounds;   There is a good thrill and good bruit in the right UE AV fistula. Hand grip is 5/5 and sensation in digits is intact;   Impression: This is a 63 y.o. male who has a functioning HD access.  Plan: Removal of Left IJ HD catheter Logan Martin 04/27/2020 9:57 AM  [facility] Catheter Removal Procedure Note  Diagnosis: ESRD with Functioning AVF/AVGG  Plan:  Remove left diatek catheter  Consent signed:  yes Time out completed:  yes Coumadin:  No. PT/INR (if applicable):   Other labs:   Procedure: 1.  Sterile prepping and draping over catheter area 2. 5 ml 2% lidocaine plain instilled at removal site. 3.  left catheter removed in its entirety with cuff in tact. 4.  Complications: none  5. Tip of catheter sent for culture:  no   Patient tolerated procedure well:  yes Pressure held, no bleeding noted, dressing applied Instructions given to the pt regarding wound care and bleeding.  Other:  Logan Martin 04/27/2020 9:57 AM

## 2020-04-27 NOTE — Progress Notes (Signed)
Dressing site from catheter removal clean dry and intact. Instructions provided by PA and pt states understanding with no further questions.

## 2020-05-04 ENCOUNTER — Encounter (HOSPITAL_COMMUNITY): Payer: Self-pay | Admitting: *Deleted

## 2020-05-04 ENCOUNTER — Emergency Department (HOSPITAL_COMMUNITY): Payer: Medicare Other

## 2020-05-04 ENCOUNTER — Inpatient Hospital Stay (HOSPITAL_COMMUNITY)
Admission: EM | Admit: 2020-05-04 | Discharge: 2020-05-07 | DRG: 286 | Disposition: A | Discharge status: Home / Self Care | Payer: Medicare Other | Attending: Internal Medicine | Admitting: Internal Medicine

## 2020-05-04 ENCOUNTER — Other Ambulatory Visit: Payer: Self-pay

## 2020-05-04 DIAGNOSIS — G8929 Other chronic pain: Secondary | ICD-10-CM | POA: Diagnosis present

## 2020-05-04 DIAGNOSIS — Z9115 Patient's noncompliance with renal dialysis: Secondary | ICD-10-CM

## 2020-05-04 DIAGNOSIS — I77810 Thoracic aortic ectasia: Secondary | ICD-10-CM | POA: Diagnosis present

## 2020-05-04 DIAGNOSIS — Z79899 Other long term (current) drug therapy: Secondary | ICD-10-CM

## 2020-05-04 DIAGNOSIS — I739 Peripheral vascular disease, unspecified: Secondary | ICD-10-CM | POA: Diagnosis present

## 2020-05-04 DIAGNOSIS — R0789 Other chest pain: Secondary | ICD-10-CM | POA: Diagnosis present

## 2020-05-04 DIAGNOSIS — Z9079 Acquired absence of other genital organ(s): Secondary | ICD-10-CM

## 2020-05-04 DIAGNOSIS — Z87442 Personal history of urinary calculi: Secondary | ICD-10-CM

## 2020-05-04 DIAGNOSIS — I160 Hypertensive urgency: Principal | ICD-10-CM | POA: Diagnosis present

## 2020-05-04 DIAGNOSIS — I429 Cardiomyopathy, unspecified: Secondary | ICD-10-CM | POA: Diagnosis present

## 2020-05-04 DIAGNOSIS — Z955 Presence of coronary angioplasty implant and graft: Secondary | ICD-10-CM

## 2020-05-04 DIAGNOSIS — N2581 Secondary hyperparathyroidism of renal origin: Secondary | ICD-10-CM | POA: Diagnosis present

## 2020-05-04 DIAGNOSIS — D509 Iron deficiency anemia, unspecified: Secondary | ICD-10-CM | POA: Diagnosis present

## 2020-05-04 DIAGNOSIS — Z882 Allergy status to sulfonamides status: Secondary | ICD-10-CM

## 2020-05-04 DIAGNOSIS — Z8551 Personal history of malignant neoplasm of bladder: Secondary | ICD-10-CM

## 2020-05-04 DIAGNOSIS — E8770 Fluid overload, unspecified: Secondary | ICD-10-CM

## 2020-05-04 DIAGNOSIS — Z20822 Contact with and (suspected) exposure to covid-19: Secondary | ICD-10-CM | POA: Diagnosis present

## 2020-05-04 DIAGNOSIS — I2510 Atherosclerotic heart disease of native coronary artery without angina pectoris: Secondary | ICD-10-CM | POA: Diagnosis present

## 2020-05-04 DIAGNOSIS — E7800 Pure hypercholesterolemia, unspecified: Secondary | ICD-10-CM | POA: Diagnosis present

## 2020-05-04 DIAGNOSIS — N186 End stage renal disease: Secondary | ICD-10-CM | POA: Diagnosis present

## 2020-05-04 DIAGNOSIS — R0602 Shortness of breath: Secondary | ICD-10-CM

## 2020-05-04 DIAGNOSIS — N13729 Vesicoureteral-reflux with reflux nephropathy without hydroureter, unspecified: Secondary | ICD-10-CM | POA: Diagnosis present

## 2020-05-04 DIAGNOSIS — Z8249 Family history of ischemic heart disease and other diseases of the circulatory system: Secondary | ICD-10-CM

## 2020-05-04 DIAGNOSIS — Z91014 Allergy to mammalian meats: Secondary | ICD-10-CM

## 2020-05-04 DIAGNOSIS — Z8619 Personal history of other infectious and parasitic diseases: Secondary | ICD-10-CM

## 2020-05-04 DIAGNOSIS — E785 Hyperlipidemia, unspecified: Secondary | ICD-10-CM | POA: Diagnosis present

## 2020-05-04 DIAGNOSIS — I214 Non-ST elevation (NSTEMI) myocardial infarction: Secondary | ICD-10-CM

## 2020-05-04 DIAGNOSIS — R778 Other specified abnormalities of plasma proteins: Secondary | ICD-10-CM | POA: Diagnosis present

## 2020-05-04 DIAGNOSIS — Z86711 Personal history of pulmonary embolism: Secondary | ICD-10-CM

## 2020-05-04 DIAGNOSIS — I132 Hypertensive heart and chronic kidney disease with heart failure and with stage 5 chronic kidney disease, or end stage renal disease: Secondary | ICD-10-CM | POA: Diagnosis present

## 2020-05-04 DIAGNOSIS — H9190 Unspecified hearing loss, unspecified ear: Secondary | ICD-10-CM | POA: Diagnosis present

## 2020-05-04 DIAGNOSIS — D631 Anemia in chronic kidney disease: Secondary | ICD-10-CM | POA: Diagnosis present

## 2020-05-04 DIAGNOSIS — D696 Thrombocytopenia, unspecified: Secondary | ICD-10-CM | POA: Diagnosis present

## 2020-05-04 DIAGNOSIS — M898X9 Other specified disorders of bone, unspecified site: Secondary | ICD-10-CM | POA: Diagnosis present

## 2020-05-04 DIAGNOSIS — I5043 Acute on chronic combined systolic (congestive) and diastolic (congestive) heart failure: Secondary | ICD-10-CM | POA: Diagnosis present

## 2020-05-04 DIAGNOSIS — M545 Low back pain, unspecified: Secondary | ICD-10-CM | POA: Diagnosis present

## 2020-05-04 DIAGNOSIS — Z888 Allergy status to other drugs, medicaments and biological substances status: Secondary | ICD-10-CM

## 2020-05-04 DIAGNOSIS — Z7982 Long term (current) use of aspirin: Secondary | ICD-10-CM

## 2020-05-04 DIAGNOSIS — Z87891 Personal history of nicotine dependence: Secondary | ICD-10-CM

## 2020-05-04 DIAGNOSIS — Z992 Dependence on renal dialysis: Secondary | ICD-10-CM

## 2020-05-04 DIAGNOSIS — F1411 Cocaine abuse, in remission: Secondary | ICD-10-CM | POA: Diagnosis present

## 2020-05-04 DIAGNOSIS — R9431 Abnormal electrocardiogram [ECG] [EKG]: Secondary | ICD-10-CM

## 2020-05-04 LAB — CBC
HCT: 37.7 % — ABNORMAL LOW (ref 39.0–52.0)
Hemoglobin: 11.5 g/dL — ABNORMAL LOW (ref 13.0–17.0)
MCH: 29.3 pg (ref 26.0–34.0)
MCHC: 30.5 g/dL (ref 30.0–36.0)
MCV: 95.9 fL (ref 80.0–100.0)
Platelets: 109 10*3/uL — ABNORMAL LOW (ref 150–400)
RBC: 3.93 MIL/uL — ABNORMAL LOW (ref 4.22–5.81)
RDW: 15.7 % — ABNORMAL HIGH (ref 11.5–15.5)
WBC: 4.5 10*3/uL (ref 4.0–10.5)
nRBC: 0 % (ref 0.0–0.2)

## 2020-05-04 LAB — BASIC METABOLIC PANEL
Anion gap: 14 (ref 5–15)
BUN: 54 mg/dL — ABNORMAL HIGH (ref 8–23)
CO2: 24 mmol/L (ref 22–32)
Calcium: 9.4 mg/dL (ref 8.9–10.3)
Chloride: 96 mmol/L — ABNORMAL LOW (ref 98–111)
Creatinine, Ser: 11.52 mg/dL — ABNORMAL HIGH (ref 0.61–1.24)
GFR, Estimated: 4 mL/min — ABNORMAL LOW (ref >60)
Glucose, Bld: 84 mg/dL (ref 70–99)
Potassium: 4.6 mmol/L (ref 3.5–5.1)
Sodium: 134 mmol/L — ABNORMAL LOW (ref 135–145)

## 2020-05-04 LAB — TROPONIN I (HIGH SENSITIVITY): Troponin I (High Sensitivity): 125 ng/L (ref <18)

## 2020-05-04 NOTE — ED Provider Notes (Signed)
[facility] EMERGENCY DEPARTMENT Provider Note   CSN: [id number] Arrival date & time: 05/04/20  1923     History Chief Complaint  Patient presents with  . Shortness of Breath    Logan Martin is a 64 y.o. male.  Patient presents to the emergency department with a chief complaint of shortness of breath.  He states his symptoms started yesterday.  He has history of end-stage renal disease, and is on dialysis.  He is dialyzed Monday, Tuesday, Thursday, and Friday.  His last dialysis was on Monday.  He does not associate his symptoms that he is having now with having missed dialysis yesterday.  He does report cough along with shortness of breath.  He endorses chest tightness.  He denies any fevers or chills.  Denies any successful treatments prior to arrival.  Denies any other associated symptoms.  The history is provided by the patient. No language interpreter was used.       Past Medical History:  Diagnosis Date  . Alcohol abuse    quit date in 1995  . Anemia   . Cancer ([place])    bladder  . Chronic low back pain   . Cocaine abuse in remission ([place])    Quit date in 1995  . ESRD (end stage renal disease) ([place])    Home Dialysis  . 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 Started HD in [place], [place].  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  On NxStage home hemo since 2010. Using RUA AVF.    [name] Hearing aid worn    B/L  . Hepatitis C    s/p treatment with Mavyret  . History of blood transfusion 2007 X 1  . History of kidney stones   . Hx of constipation   . Hypertension   . Left inguinal hernia   . Peripheral vascular disease ([place])   . Pulmonary embolism [facility]) Feb 2012   Treated with coumadin x 1 year  . Wears glasses     Patient Active Problem List   Diagnosis Date Noted  . Allergy, unspecified, sequela 03/18/2020  . Pain, unspecified 03/18/2020  . Personal history of anaphylaxis 03/18/2020  . Cellulitis  03/10/2020  . Chronic low back pain   . Peripheral vascular disease ([place])   . Other mechanical complication of surgically created arteriovenous fistula, initial encounter ([place]) 03/09/2020  . Unspecified open wound of right upper arm, subsequent encounter 03/09/2020  . Hypercalcemia 01/20/2020  . Other disorders of phosphorus metabolism 01/01/2020  . Severe mitral regurgitation 12/22/2018  . Hypercholesterolemia 12/22/2018  . History of bladder cancer 08/14/2018  . Personal history of pulmonary embolism 08/14/2018  . Red blood cell antibody positive, compatible PRBC difficult to obtain 08/14/2018  . Malignant neoplasm of overlapping sites of bladder ([place]) 07/20/2018  . Volume overload 06/18/2018  . Fluid overload 06/17/2018  . Elevated troponin 06/17/2018  . Hematuria 03/27/2018  . Hypokalemia 03/27/2018  . Thrombocytopenia ([place]) 03/27/2018  . Anemia in ESRD (end-stage renal disease) ([place]) 03/27/2018  . Bladder tumor 03/27/2018  . Encounter for adequacy testing for peritoneal dialysis ([place]) 01/03/2018  . 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 Compression of vein 11/12/2017  . Hypertension with heart disease 10/14/2017  . Pyelocystitis 10/13/2017  . 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 Cramp and spasm 02/20/2017  . Acidosis 02/18/2017  . Dependence on renal dialysis ([place]) 02/18/2017  . Hypertensive heart and chronic kidney disease with heart failure and  with stage 5 chronic kidney disease, or end stage renal disease ([place]) 02/18/2017  . Iron deficiency anemia, unspecified 02/18/2017  . Moderate protein-calorie malnutrition ([place]) 02/18/2017  . Other dietary vitamin B12 deficiency anemia 02/18/2017  . Other disorders of electrolyte and fluid balance, not elsewhere classified 02/18/2017  . Other disorders resulting from impaired renal tubular function 02/18/2017  . Personal history of nicotine dependence 02/18/2017  . Cardiomegaly 01/21/2017  . Congestive heart failure ([place]) 01/21/2017   . Pancytopenia ([place]) 06/14/2016  . Left arm pain 06/14/2016  . 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 Mechanical complication of other vascular device, implant, and graft 08/10/2013  . End stage renal disease ([place]) 08/10/2013  . ESRD (end stage renal disease) ([place]) 06/07/2013  . Nausea and vomiting 06/07/2013  . SOB (shortness of breath) 05/10/2013  . Chest discomfort 11/27/2012  . Shortness of breath 11/27/2012  . Chronic hepatitis C without hepatic coma ([place]) 02/04/2012  . Secondary hyperparathyroidism (of renal origin) 02/04/2012  . Anemia associated with chronic renal failure 02/04/2012  . CAD (coronary atherosclerotic disease) 02/04/2012  . Pulmonary embolism ([place]) 07/20/2010  . Chronic pain syndrome 06/08/2010  . Renal failure 04/26/2010  . Atherosclerosis of native coronary artery of native heart without angina pectoris 02/23/2009  . HEMORRHOIDS, INTERNAL 02/23/2009  . GERD 02/23/2009  . ESRD on dialysis ([place]) 02/16/2009  . Allergic rhinitis 05/06/2008    Past Surgical History:  Procedure Laterality Date  . A/V FISTULAGRAM N/A 11/22/2017   Procedure: A/V Fistulagram;  Surgeon: Serafina Mitchell, MD;  Location: [place] [facility];  Service: Cardiovascular;  Laterality: N/A;  rt. arm   . ABDOMINAL HERNIA REPAIR  2000    x 2  . AV FISTULA PLACEMENT Right 2007   upper arm  . AV FISTULA REPAIR Right ~ 03/2016 & 04/2016  . BLADDER AUGMENTATION  1975  . COLONOSCOPY    . CORONARY ANGIOPLASTY WITH STENT PLACEMENT  1999  . CYSTOSCOPY W/ URETERAL STENT PLACEMENT N/A 10/13/2017   Procedure: CYSTOSCOPY WITH IRRIGATION OF BLADDER PLACEMENT OF FOLEY;  Surgeon: Ceasar Mons, MD;  Location: [place];  Service: Urology;  Laterality: N/A;  . CYSTOSCOPY WITH FULGERATION N/A 03/27/2018   Procedure: [place] AND MUCOUS EVACUATION, BLADDER BIOPSY;  Surgeon: Franchot Gallo, MD;  Location: [facility];  Service: Urology;  Laterality: N/A;  .  ESOPHAGOGASTRODUODENOSCOPY    . EXTRACORPOREAL SHOCK WAVE LITHOTRIPSY    . FISTULA SUPERFICIALIZATION Right 4/0/1027   Procedure: PLICATION OF ANEURYSM OF ARTERIOVENOUS FISTULA;  Surgeon: Angelia Mould, MD;  Location: [place];  Service: Vascular;  Laterality: Right;  . HERNIA REPAIR    . INGUINAL HERNIA REPAIR Right 1975  . INGUINAL HERNIA REPAIR Left 03/04/2019   Procedure: LEFT OPEN INGUINAL HERNIA REPAIR WITH MESH;  Surgeon: Kinsinger, Arta Bruce, MD;  Location: [facility];  Service: General;  Laterality: Left;  GENERAL AND LMA  . INSERTION OF DIALYSIS CATHETER Left 03/15/2020   Procedure: INSERTION OF Left Internal Jugular DIALYSIS CATHETER;  Surgeon: [name], MD;  Location: [place];  Service: Vascular;  Laterality: Left;  . IR FLUORO GUIDE CV LINE LEFT  03/11/2020  . IR US GUIDE VASC ACCESS LEFT  03/11/2020  . PERIPHERAL VASCULAR BALLOON ANGIOPLASTY  11/22/2017   Procedure: PERIPHERAL VASCULAR BALLOON ANGIOPLASTY;  Surgeon: Serafina Mitchell, MD;  Location: [facility];  Service: Cardiovascular;;  rt. arm fistula  . PERIPHERAL VASCULAR CATHETERIZATION N/A 06/22/2016   Procedure: Left Arm Venography;  Surgeon: Elam Dutch, MD;  Location: [facility];  Service: Cardiovascular;  Laterality: N/A;  . PERIPHERAL VASCULAR CATHETERIZATION N/A 06/22/2016   Procedure: A/V Fistulagram - Right Arm;  Surgeon: Elam Dutch, MD;  Location: [facility];  Service: Cardiovascular;  Laterality: N/A;  . PERIPHERAL VASCULAR CATHETERIZATION Right 06/22/2016   Procedure: Peripheral Vascular Balloon Angioplasty;  Surgeon: Elam Dutch, MD;  Location: [facility];  Service: Cardiovascular;  Laterality: Right;  arm fistula  . REVISON OF ARTERIOVENOUS FISTULA Right 03/15/2020   Procedure: RIGHT ARM ARTERIOVENOUS FISTULA REVISON;  Surgeon: [name], MD;  Location: [facility];  Service: Vascular;  Laterality: Right;       Family History  Problem Relation Age of Onset  .  Cancer Mother   . Diabetes Mother   . Hypertension Mother   . Deep vein thrombosis Daughter     Social History   Tobacco Use  . Smoking status: Former Smoker    Packs/day: 1.00    Years: 5.00    Pack years: 5.00    Types: Cigarettes    Quit date: 05/28/1993    Years since quitting: 26.9  . Smokeless tobacco: Never Used  Vaping Use  . Vaping Use: Never used  Substance Use Topics  . Alcohol use: Not Currently    Comment:  "nothing since 1995"  . Drug use: Not Currently    Types: Marijuana, Cocaine    Comment: denies 03/02/19    Home Medications Prior to Admission medications   Medication Sig Start Date End Date Taking? Authorizing Provider  amLODipine (NORVASC) 10 MG tablet Take 1 tablet (10 mg total) by mouth daily. 03/18/20   Aline August, MD  aspirin EC 81 MG tablet Take 81 mg by mouth at bedtime.     [provider]  atorvastatin (LIPITOR) 40 MG tablet Take 40 mg by mouth daily. 01/20/20   [provider]  AURYXIA 1 GM 210 MG(Fe) tablet Take 420 mg by mouth 3 (three) times daily. 02/12/20   [provider]  calcitRIOL (ROCALTROL) 0.5 MCG capsule Take 1.5 mcg by mouth daily. 10/25/17   [provider]  isosorbide mononitrate (IMDUR) 30 MG 24 hr tablet Take 30 mg by mouth daily.  08/15/18 03/10/20  [provider]  lidocaine-prilocaine (EMLA) cream Apply 1 application topically See admin instructions. Apply small amount to dialysis port (AVF) one hour before dialysis. Cover with occlusive dressing (saran wrap) 11/14/17   [provider]  metoprolol succinate (TOPROL-XL) 50 MG 24 hr tablet Take 50 mg by mouth daily. 02/16/20   [provider]  oxycodone (ROXICODONE) 30 MG immediate release tablet Take 30 mg by mouth 3 (three) times daily.  08/26/18   [provider]  Oxycodone HCl 10 MG TABS Take 10 mg by mouth daily as needed (breakthrough pain).  05/22/16   [provider]    Allergies    Heparin,  Pork-derived products, and Sulfa antibiotics  Review of Systems   Review of Systems  All other systems reviewed and are negative.   Physical Exam Updated Vital Signs BP (!) 155/99   Pulse 91   Temp 98.9 F (37.2 C)   Resp 17   Ht 5\' 4"  (1.626 m)   Wt 50 kg   SpO2 98%   BMI 18.92 kg/m   Physical Exam Vitals and nursing note reviewed.  Constitutional:      Appearance: He is well-developed.  HENT:  Head: Normocephalic and atraumatic.  Eyes:     Conjunctiva/sclera: Conjunctivae normal.  Cardiovascular:     Rate and Rhythm: Regular rhythm. Tachycardia present.     Heart sounds: No murmur heard.   Pulmonary:     Effort: Respiratory distress (Moderate) present.     Breath sounds: Normal breath sounds.     Comments: Moderately increased work of breathing Abdominal:     Palpations: Abdomen is soft.     Tenderness: There is no abdominal tenderness.  Musculoskeletal:     Cervical back: Neck supple.  Skin:    General: Skin is warm and dry.  Neurological:     Mental Status: He is alert and oriented to person, place, and time.  Psychiatric:        Mood and Affect: Mood normal.        Behavior: Behavior normal.     ED Results / Procedures / Treatments   Labs (all labs ordered are listed, but only abnormal results are displayed) Labs Reviewed  BASIC METABOLIC PANEL - Abnormal; Notable for the following components:      Result Value   Sodium 134 (*)    Chloride 96 (*)    BUN 54 (*)    Creatinine, Ser 11.52 (*)    GFR, Estimated 4 (*)    All other components within normal limits  CBC - Abnormal; Notable for the following components:   RBC 3.93 (*)    Hemoglobin 11.5 (*)    HCT 37.7 (*)    RDW 15.7 (*)    Platelets 109 (*)    All other components within normal limits  TROPONIN I (HIGH SENSITIVITY) - Abnormal; Notable for the following components:   Troponin I (High Sensitivity) 125 (*)    All other components within normal limits  TROPONIN I (HIGH SENSITIVITY)     EKG None  Radiology DG Chest 2 View  Result Date: 05/04/2020 CLINICAL DATA:  Shortness of breath EXAM: CHEST - 2 VIEW COMPARISON:  03/15/2020 FINDINGS: Right subclavian/innominate stent unchanged. Small bilateral effusions. Cardiomegaly with vascular congestion and mild interstitial opacities suspicious for pulmonary edema. More confluent airspace disease at both lung bases. No pneumothorax. IMPRESSION: Cardiomegaly with vascular congestion, mild interstitial opacities suspicious for pulmonary edema. Small bilateral effusions. More confluent airspace disease at the bases is suspicious for superimposed pneumonia. Electronically Signed   By: Donavan Foil M.D.   On: 05/04/2020 20:57    Procedures .Critical Care Performed by: Montine Circle, PA-C Authorized by: Montine Circle, PA-C   Critical care provider statement:    Critical care time (minutes):  40   Critical care was necessary to treat or prevent imminent or life-threatening deterioration of the following conditions:  Respiratory failure   Critical care was time spent personally by me on the following activities:  Discussions with consultants, evaluation of patient's response to treatment, examination of patient, ordering and performing treatments and interventions, ordering and review of laboratory studies, ordering and review of radiographic studies, pulse oximetry, re-evaluation of patient's condition, obtaining history from patient or surrogate and review of old charts   (including critical care time)  Medications Ordered in ED Medications - No data to display  ED Course  I have reviewed the triage vital signs and the nursing notes.  Pertinent labs & imaging results that were available during my care of the patient were reviewed by me and considered in my medical decision making (see chart for details).    MDM Rules/Calculators/A&P  This patient complains of shortness of breath, this involves  an extensive number of treatment options, and is a complaint that carries with it a high risk of complications and morbidity.    Differential Dx Pulmonary edema, volume overload, pneumonia, Covid, PE, ACS  Pertinent Labs I reviewed, and interpreted labs, which included CBC, BMP, and troponin, notable for potassium of 4.6, troponin 125, the last that I can find in the record was from January 2020, and was 0.38.  Imaging Interpretation I ordered imaging studies which included chest x-ray shows vascular congestion, possible pulmonary edema, question opacities    Previous records obtained and reviewed show most recent admission on 10/14 of 1561 for complication of his vascular access   Critical Interventions  BiPAP  Reassessments After the interventions stated above, I reevaluated the patient and found patient stable for admission.  Consultants Dr. Charlies Silvers, who will have the patient to the dialysis list, anticipates dialysis in the morning. Dr. Doreene Nest, who is appreciated for admitting the patient.  Plan Admit    Final Clinical Impression(s) / ED Diagnoses Final diagnoses:  Hypervolemia, unspecified hypervolemia type  SOB (shortness of breath)  Chest tightness  Elevated troponin    Rx / DC Orders ED Discharge Orders    None       Carless, [place], PA-C 05/05/20 Rachel, Delice Bison, DO 05/05/20 [id number]

## 2020-05-04 NOTE — ED Triage Notes (Signed)
The pt is c/o sob since yesterday  He is dialysis and was dialyzed on Monday  Fistula rt arm

## 2020-05-04 NOTE — ED Notes (Signed)
Elevated tro acuity switched to  A 2

## 2020-05-05 ENCOUNTER — Encounter (HOSPITAL_COMMUNITY): Payer: Self-pay | Admitting: Family Medicine

## 2020-05-05 ENCOUNTER — Emergency Department (HOSPITAL_COMMUNITY): Payer: Medicare Other

## 2020-05-05 DIAGNOSIS — N186 End stage renal disease: Secondary | ICD-10-CM | POA: Diagnosis present

## 2020-05-05 DIAGNOSIS — N2581 Secondary hyperparathyroidism of renal origin: Secondary | ICD-10-CM | POA: Diagnosis present

## 2020-05-05 DIAGNOSIS — G8929 Other chronic pain: Secondary | ICD-10-CM | POA: Diagnosis present

## 2020-05-05 DIAGNOSIS — I5021 Acute systolic (congestive) heart failure: Secondary | ICD-10-CM

## 2020-05-05 DIAGNOSIS — I132 Hypertensive heart and chronic kidney disease with heart failure and with stage 5 chronic kidney disease, or end stage renal disease: Secondary | ICD-10-CM | POA: Diagnosis present

## 2020-05-05 DIAGNOSIS — M898X9 Other specified disorders of bone, unspecified site: Secondary | ICD-10-CM | POA: Diagnosis present

## 2020-05-05 DIAGNOSIS — R0789 Other chest pain: Secondary | ICD-10-CM | POA: Diagnosis present

## 2020-05-05 DIAGNOSIS — M545 Low back pain, unspecified: Secondary | ICD-10-CM | POA: Diagnosis present

## 2020-05-05 DIAGNOSIS — Z7982 Long term (current) use of aspirin: Secondary | ICD-10-CM | POA: Diagnosis not present

## 2020-05-05 DIAGNOSIS — D509 Iron deficiency anemia, unspecified: Secondary | ICD-10-CM | POA: Diagnosis present

## 2020-05-05 DIAGNOSIS — Z79899 Other long term (current) drug therapy: Secondary | ICD-10-CM | POA: Diagnosis not present

## 2020-05-05 DIAGNOSIS — H9190 Unspecified hearing loss, unspecified ear: Secondary | ICD-10-CM | POA: Diagnosis present

## 2020-05-05 DIAGNOSIS — D631 Anemia in chronic kidney disease: Secondary | ICD-10-CM | POA: Diagnosis present

## 2020-05-05 DIAGNOSIS — I739 Peripheral vascular disease, unspecified: Secondary | ICD-10-CM | POA: Diagnosis present

## 2020-05-05 DIAGNOSIS — F1411 Cocaine abuse, in remission: Secondary | ICD-10-CM | POA: Diagnosis present

## 2020-05-05 DIAGNOSIS — E7800 Pure hypercholesterolemia, unspecified: Secondary | ICD-10-CM | POA: Diagnosis present

## 2020-05-05 DIAGNOSIS — D696 Thrombocytopenia, unspecified: Secondary | ICD-10-CM | POA: Diagnosis present

## 2020-05-05 DIAGNOSIS — I5043 Acute on chronic combined systolic (congestive) and diastolic (congestive) heart failure: Secondary | ICD-10-CM | POA: Diagnosis present

## 2020-05-05 DIAGNOSIS — N13729 Vesicoureteral-reflux with reflux nephropathy without hydroureter, unspecified: Secondary | ICD-10-CM | POA: Diagnosis present

## 2020-05-05 DIAGNOSIS — Z992 Dependence on renal dialysis: Secondary | ICD-10-CM | POA: Diagnosis not present

## 2020-05-05 DIAGNOSIS — Z20822 Contact with and (suspected) exposure to covid-19: Secondary | ICD-10-CM | POA: Diagnosis present

## 2020-05-05 DIAGNOSIS — I2510 Atherosclerotic heart disease of native coronary artery without angina pectoris: Secondary | ICD-10-CM | POA: Diagnosis present

## 2020-05-05 DIAGNOSIS — I429 Cardiomyopathy, unspecified: Secondary | ICD-10-CM | POA: Diagnosis present

## 2020-05-05 DIAGNOSIS — R9431 Abnormal electrocardiogram [ECG] [EKG]: Secondary | ICD-10-CM

## 2020-05-05 DIAGNOSIS — I77810 Thoracic aortic ectasia: Secondary | ICD-10-CM | POA: Diagnosis present

## 2020-05-05 DIAGNOSIS — I160 Hypertensive urgency: Secondary | ICD-10-CM | POA: Diagnosis present

## 2020-05-05 DIAGNOSIS — E785 Hyperlipidemia, unspecified: Secondary | ICD-10-CM | POA: Diagnosis present

## 2020-05-05 DIAGNOSIS — J9601 Acute respiratory failure with hypoxia: Secondary | ICD-10-CM | POA: Diagnosis not present

## 2020-05-05 LAB — ECHOCARDIOGRAM COMPLETE
Height: 64 in
MV M vel: 6.98 m/s
MV Peak grad: 194.9 mmHg
Radius: 0.4 cm
S' Lateral: 4.3 cm
Weight: 1802.48 oz

## 2020-05-05 LAB — I-STAT ARTERIAL BLOOD GAS, ED
Acid-Base Excess: 4 mmol/L — ABNORMAL HIGH (ref 0.0–2.0)
Bicarbonate: 28.3 mmol/L — ABNORMAL HIGH (ref 20.0–28.0)
Calcium, Ion: 1.14 mmol/L — ABNORMAL LOW (ref 1.15–1.40)
HCT: 35 % — ABNORMAL LOW (ref 39.0–52.0)
Hemoglobin: 11.9 g/dL — ABNORMAL LOW (ref 13.0–17.0)
O2 Saturation: 97 %
Patient temperature: 96.7
Potassium: 4.7 mmol/L (ref 3.5–5.1)
Sodium: 136 mmol/L (ref 135–145)
TCO2: 30 mmol/L (ref 22–32)
pCO2 arterial: 37.2 mmHg (ref 32.0–48.0)
pH, Arterial: 7.486 — ABNORMAL HIGH (ref 7.350–7.450)
pO2, Arterial: 82 mmHg — ABNORMAL LOW (ref 83.0–108.0)

## 2020-05-05 LAB — CBC
HCT: 36.4 % — ABNORMAL LOW (ref 39.0–52.0)
Hemoglobin: 11.6 g/dL — ABNORMAL LOW (ref 13.0–17.0)
MCH: 30.2 pg (ref 26.0–34.0)
MCHC: 31.9 g/dL (ref 30.0–36.0)
MCV: 94.8 fL (ref 80.0–100.0)
Platelets: 84 10*3/uL — ABNORMAL LOW (ref 150–400)
RBC: 3.84 MIL/uL — ABNORMAL LOW (ref 4.22–5.81)
RDW: 15.9 % — ABNORMAL HIGH (ref 11.5–15.5)
WBC: 4.1 10*3/uL (ref 4.0–10.5)
nRBC: 0 % (ref 0.0–0.2)

## 2020-05-05 LAB — TROPONIN I (HIGH SENSITIVITY)
Troponin I (High Sensitivity): 139 ng/L (ref <18)
Troponin I (High Sensitivity): 135 ng/L (ref <18)
Troponin I (High Sensitivity): 149 ng/L (ref <18)
Troponin I (High Sensitivity): 129 ng/L (ref <18)
Troponin I (High Sensitivity): 140 ng/L (ref <18)

## 2020-05-05 LAB — BASIC METABOLIC PANEL
Anion gap: 16 — ABNORMAL HIGH (ref 5–15)
BUN: 51 mg/dL — ABNORMAL HIGH (ref 8–23)
CO2: 26 mmol/L (ref 22–32)
Calcium: 9.5 mg/dL (ref 8.9–10.3)
Chloride: 97 mmol/L — ABNORMAL LOW (ref 98–111)
Creatinine, Ser: 12.16 mg/dL — ABNORMAL HIGH (ref 0.61–1.24)
GFR, Estimated: 4 mL/min — ABNORMAL LOW (ref >60)
Glucose, Bld: 75 mg/dL (ref 70–99)
Potassium: 4.8 mmol/L (ref 3.5–5.1)
Sodium: 139 mmol/L (ref 135–145)

## 2020-05-05 LAB — RESP PANEL BY RT-PCR (FLU A&B, COVID) ARPGX2
Influenza A by PCR: NEGATIVE
Influenza B by PCR: NEGATIVE
SARS Coronavirus 2 by RT PCR: NEGATIVE

## 2020-05-05 LAB — BRAIN NATRIURETIC PEPTIDE: B Natriuretic Peptide: 3725.8 pg/mL — ABNORMAL HIGH (ref 0.0–100.0)

## 2020-05-05 LAB — SURGICAL PCR SCREEN
MRSA, PCR: NEGATIVE
Staphylococcus aureus: NEGATIVE

## 2020-05-05 MED ORDER — PENTAFLUOROPROP-TETRAFLUOROETH EX AERO
1.0000 "application " | INHALATION_SPRAY | CUTANEOUS | Status: DC | PRN
  Filled 2020-05-05: qty 116

## 2020-05-05 MED ORDER — HYDRALAZINE HCL 25 MG PO TABS
25.0000 mg | ORAL_TABLET | Freq: Three times a day (TID) | ORAL | Status: DC
  Administered 2020-05-05 – 2020-05-07 (×5): 25 mg via ORAL
  Filled 2020-05-05 (×5): qty 1

## 2020-05-05 MED ORDER — METOPROLOL SUCCINATE ER 50 MG PO TB24
50.0000 mg | ORAL_TABLET | Freq: Every day | ORAL | Status: DC
  Administered 2020-05-06 – 2020-05-07 (×2): 50 mg via ORAL
  Filled 2020-05-05: qty 1
  Filled 2020-05-05: qty 2
  Filled 2020-05-05: qty 1

## 2020-05-05 MED ORDER — FERRIC CITRATE 1 GM 210 MG(FE) PO TABS
420.0000 mg | ORAL_TABLET | Freq: Three times a day (TID) | ORAL | Status: DC
  Administered 2020-05-05 – 2020-05-07 (×3): 420 mg via ORAL
  Filled 2020-05-05 (×9): qty 2

## 2020-05-05 MED ORDER — ACETAMINOPHEN 325 MG PO TABS: 650.0000 mg | ORAL_TABLET | Freq: Four times a day (QID) | ORAL | Status: DC | PRN

## 2020-05-05 MED ORDER — CALCITRIOL 0.5 MCG PO CAPS
1.5000 ug | ORAL_CAPSULE | Freq: Every day | ORAL | Status: DC
  Administered 2020-05-07: 1.5 ug via ORAL
  Filled 2020-05-05 (×2): qty 3

## 2020-05-05 MED ORDER — ASPIRIN 81 MG PO CHEW
81.0000 mg | CHEWABLE_TABLET | ORAL | Status: AC
  Administered 2020-05-06: 81 mg via ORAL
  Filled 2020-05-05: qty 1

## 2020-05-05 MED ORDER — OXYCODONE HCL 5 MG PO TABS
ORAL_TABLET | ORAL | Status: AC
  Filled 2020-05-05: qty 1

## 2020-05-05 MED ORDER — LIDOCAINE HCL (PF) 1 % IJ SOLN: 5.0000 mL | INTRAMUSCULAR | Status: DC | PRN

## 2020-05-05 MED ORDER — AMLODIPINE BESYLATE 10 MG PO TABS
10.0000 mg | ORAL_TABLET | Freq: Every day | ORAL | Status: DC
  Administered 2020-05-05 – 2020-05-07 (×2): 10 mg via ORAL
  Filled 2020-05-05 (×3): qty 1

## 2020-05-05 MED ORDER — ASPIRIN EC 81 MG PO TBEC
81.0000 mg | DELAYED_RELEASE_TABLET | Freq: Every day | ORAL | Status: DC
  Administered 2020-05-05 – 2020-05-06 (×2): 81 mg via ORAL
  Filled 2020-05-05 (×2): qty 1

## 2020-05-05 MED ORDER — ACETAMINOPHEN 650 MG RE SUPP: 650.0000 mg | Freq: Four times a day (QID) | RECTAL | Status: DC | PRN

## 2020-05-05 MED ORDER — OXYCODONE HCL 5 MG PO TABS
5.0000 mg | ORAL_TABLET | Freq: Four times a day (QID) | ORAL | Status: DC | PRN
  Administered 2020-05-05 (×2): 5 mg via ORAL
  Filled 2020-05-05: qty 1

## 2020-05-05 MED ORDER — ATORVASTATIN CALCIUM 40 MG PO TABS
40.0000 mg | ORAL_TABLET | Freq: Every day | ORAL | Status: DC
  Administered 2020-05-07: 40 mg via ORAL
  Filled 2020-05-05 (×2): qty 1

## 2020-05-05 MED ORDER — CHLORHEXIDINE GLUCONATE CLOTH 2 % EX PADS
6.0000 | MEDICATED_PAD | Freq: Every day | CUTANEOUS | Status: DC
  Administered 2020-05-06: 6 via TOPICAL

## 2020-05-05 MED ORDER — SODIUM CHLORIDE 0.9 % IV SOLN: 100.0000 mL | INTRAVENOUS | Status: DC | PRN

## 2020-05-05 MED ORDER — LIDOCAINE-PRILOCAINE 2.5-2.5 % EX CREA
1.0000 "application " | TOPICAL_CREAM | CUTANEOUS | Status: DC | PRN
  Filled 2020-05-05: qty 5

## 2020-05-05 NOTE — Progress Notes (Signed)
Patient placed on Bipap V60 to prepare for transport to HD.

## 2020-05-05 NOTE — H&P (View-Only) (Signed)
CARDIOLOGY CONSULT NOTE  Patient ID: Logan Martin MRN: [id number] DOB/AGE: April 10, 1956 64 y.o.  Admit date: 05/04/2020 Attending physician: Modena Jansky, MD  Primary Physician:  [name], NP   Former cardiologist: Dr. Vear Clock Inpatient Cardiologist: Rex Kras, DO, [facility] Requesting Physician: Modena Jansky, MD   Chief complaint: Chest pain and shortness of breath Reason of consultation: Chest pain and elevated troponins  HPI:  Logan Martin is a 64 y.o. African-American male who presents with a chief complaint of " chest pain and shortness of breath." His past medical history and cardiovascular risk factors include: End-stage renal disease on hemodialysis, history of CAD status post PCI in [place], hypertension, hypercholesterolemia, history of PE, hepatitis C, anemia, former smoker, history of polysubstance abuse, and history of adenocarcinoma the bladder.  His predominant reason of coming to the hospital was shortness of breath. Patient states that in the past he would miss several hemodialysis session prior to becoming short of breath. However, this time he only missed one session of hemodialysis and has been experiencing significant dyspnea and chest discomfort.  Patient states that the chest pain is a pressure-like sensation, located substernally, intensity 8 out of 10, at times worse with effort related activities, does not resolve and usually self-limited. He has not used sublingual nitroglycerin tablets. He denies orthopnea, paroxysmal nocturnal dyspnea or lower extremity swelling.  Since hospitalization has been placed on BiPAP and his shortness of breath and chest pain have improved. High sensitive troponins have been essentially flat and last one was 149. EKG shows normal sinus rhythm without underlying injury pattern. Serum creatinine on admission was 12.16. And his blood pressures yesterday around midnight were 190/91.  ALLERGIES: Allergies  Allergen  Reactions  . Heparin Rash    Pork products   . Pork-Derived Products Rash  . Sulfa Antibiotics Rash    PAST MEDICAL HISTORY: Past Medical History:  Diagnosis Date  . Alcohol abuse    quit date in 1995  . Anemia   . Cancer ([place])    bladder  . Chronic low back pain   . Cocaine abuse in remission ([place])    Quit date in 1995  . ESRD (end stage renal disease) ([place])    Home Dialysis  . 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 Started HD in [place], [place].  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  On NxStage home hemo since 2010. Using RUA AVF.    [name] Hearing aid worn    B/L  . Hepatitis C    s/p treatment with [place]  . History of blood transfusion 2007 X 1  . History of kidney stones   . Hx of constipation   . Hypertension   . Left inguinal hernia   . Peripheral vascular disease ([place])   . Pulmonary embolism [facility]) Feb 2012   Treated with coumadin x 1 year  . Wears glasses     PAST SURGICAL HISTORY: Past Surgical History:  Procedure Laterality Date  . A/V FISTULAGRAM N/A 11/22/2017   Procedure: A/V Fistulagram;  Surgeon: Serafina Mitchell, MD;  Location: [place] [facility];  Service: Cardiovascular;  Laterality: N/A;  rt. arm   . ABDOMINAL HERNIA REPAIR  2000    x 2  . AV FISTULA PLACEMENT Right 2007   upper arm  . AV FISTULA REPAIR Right ~ 03/2016 & 04/2016  . BLADDER AUGMENTATION  1975  . COLONOSCOPY    . CORONARY ANGIOPLASTY WITH  [place]  . CYSTOSCOPY W/ URETERAL STENT PLACEMENT N/A 10/13/2017   Procedure: CYSTOSCOPY WITH IRRIGATION OF BLADDER PLACEMENT OF FOLEY;  Surgeon: Ceasar Mons, MD;  Location: [place];  Service: Urology;  Laterality: N/A;  . CYSTOSCOPY WITH FULGERATION N/A 03/27/2018   Procedure: [place] AND MUCOUS EVACUATION, BLADDER BIOPSY;  Surgeon: Franchot Gallo, MD;  Location: [facility];  Service: Urology;  Laterality: N/A;  . ESOPHAGOGASTRODUODENOSCOPY    .  EXTRACORPOREAL SHOCK WAVE LITHOTRIPSY    . FISTULA SUPERFICIALIZATION Right 05/31/9700   Procedure: PLICATION OF ANEURYSM OF ARTERIOVENOUS FISTULA;  Surgeon: Angelia Mould, MD;  Location: [place];  Service: Vascular;  Laterality: Right;  . HERNIA REPAIR    . INGUINAL HERNIA REPAIR Right 1975  . INGUINAL HERNIA REPAIR Left 03/04/2019   Procedure: LEFT OPEN INGUINAL HERNIA REPAIR WITH MESH;  Surgeon: Kinsinger, Arta Bruce, MD;  Location: [place];  Service: General;  Laterality: Left;  GENERAL AND LMA  . INSERTION OF DIALYSIS CATHETER Left 03/15/2020   Procedure: INSERTION OF Left Internal Jugular DIALYSIS CATHETER;  Surgeon: [name], MD;  Location: [place];  Service: Vascular;  Laterality: Left;  . IR FLUORO GUIDE CV LINE LEFT  03/11/2020  . IR US GUIDE VASC ACCESS LEFT  03/11/2020  . PERIPHERAL VASCULAR BALLOON ANGIOPLASTY  11/22/2017   Procedure: PERIPHERAL VASCULAR BALLOON ANGIOPLASTY;  Surgeon: Serafina Mitchell, MD;  Location: [place] [facility];  Service: Cardiovascular;;  rt. arm fistula  . PERIPHERAL VASCULAR CATHETERIZATION N/A 06/22/2016   Procedure: Left Arm Venography;  Surgeon: Elam Dutch, MD;  Location: [place] [facility];  Service: Cardiovascular;  Laterality: N/A;  . PERIPHERAL VASCULAR CATHETERIZATION N/A 06/22/2016   Procedure: A/V Fistulagram - Right Arm;  Surgeon: Elam Dutch, MD;  Location: [place] [facility];  Service: Cardiovascular;  Laterality: N/A;  . PERIPHERAL VASCULAR CATHETERIZATION Right 06/22/2016   Procedure: Peripheral Vascular Balloon Angioplasty;  Surgeon: Elam Dutch, MD;  Location: [place] [facility];  Service: Cardiovascular;  Laterality: Right;  arm fistula  . REVISON OF ARTERIOVENOUS FISTULA Right 03/15/2020   Procedure: RIGHT ARM ARTERIOVENOUS FISTULA REVISON;  Surgeon: [name], MD;  Location: [facility];  Service: Vascular;  Laterality: Right;    FAMILY HISTORY: The patient family history includes Cancer in his mother; Deep vein  thrombosis in his daughter; Diabetes in his mother; Hypertension in his mother.   SOCIAL HISTORY:  The patient  reports that he quit smoking about 26 years ago. His smoking use included cigarettes. He has a 5.00 pack-year smoking history. He has never used smokeless tobacco. He reports previous alcohol use. He reports previous drug use. Drugs: Marijuana and Cocaine.  MEDICATIONS: Current Outpatient Medications  Medication Instructions  . amLODipine (NORVASC) 10 mg, Oral, Daily  . aspirin EC 81 mg, Oral, Daily at bedtime  . atorvastatin (LIPITOR) 40 mg, Oral, Daily  . Auryxia 420 mg, Oral, 3 times daily  . calcitRIOL (ROCALTROL) 1.5 mcg, Oral, Daily  . Doxercalciferol (HECTOROL IV) 1 Dose. Dialysis med  . Iron Sucrose (VENOFER IV) 1 Dose. Dialysis med  . isosorbide mononitrate (IMDUR) 30 mg, Oral, Daily  . lidocaine-prilocaine (EMLA) cream 1 application, Topical, See admin instructions, Apply small amount to dialysis port (AVF) one hour before dialysis. Cover with occlusive dressing (saran wrap)  . Methoxy PEG-Epoetin Beta (MIRCERA IJ) 1 Dose. Dialysis med  . metoprolol succinate (TOPROL-XL) 50 mg, Oral, Daily  . oxyCODONE (OXY IR/ROXICODONE) 5 mg, Oral,  2 times daily PRN  . oxycodone (ROXICODONE) 30 mg, Oral, 3 times daily    Review of Systems  Constitutional: Negative for chills and fever.  HENT: Negative for hoarse voice and nosebleeds.   Eyes: Negative for discharge, double vision and pain.  Cardiovascular: Positive for chest pain and dyspnea on exertion. Negative for claudication, leg swelling, near-syncope, orthopnea, palpitations, paroxysmal nocturnal dyspnea and syncope.  Respiratory: Positive for shortness of breath. Negative for hemoptysis.   Musculoskeletal: Negative for muscle cramps and myalgias.  Gastrointestinal: Negative for abdominal pain, constipation, diarrhea, hematemesis, hematochezia, melena, nausea and vomiting.  Neurological: Negative for dizziness and  light-headedness.    PHYSICAL EXAM: Vitals with BMI 05/05/2020 05/05/2020 05/05/2020  Height - - -  Weight - - -  BMI - - -  Systolic 703 500 938  Diastolic 88 90 88  Pulse 182 105 102    No intake or output data in the 24 hours ending 05/05/20 1216  Net IO Since Admission: No IO data has been entered for this period [05/05/20 1216]  CONSTITUTIONAL: Appearing older than stated age, hemodynamically stable, currently in dialysis, on BiPAP, no acute distress.  SKIN: Skin is warm and dry. No rash noted. No cyanosis. No pallor. No jaundice HEAD: Normocephalic and atraumatic.  EYES: No scleral icterus MOUTH/THROAT: Dry oral membranes.  NECK: No JVD present. No thyromegaly noted. No carotid bruits  LYMPHATIC: No visible cervical adenopathy.  CHEST Normal respiratory effort. No intercostal retractions  LUNGS: Clear to auscultation in the upper lung fields with rales noted bilaterally.  CARDIOVASCULAR: Regular, positive S1-S2, tachycardia, soft holosystolic murmur heard at the apex, no gallops or rubs ABDOMINAL: Nonobese, soft, nontender, nondistended, positive bowel sounds in all 4 quadrants, no apparent ascites.  EXTREMITIES: No peripheral edema. Cool to touch bilaterally. Pulses difficult to appreciate. HEMATOLOGIC: No significant bruising NEUROLOGIC: Oriented to person, place, and time. Nonfocal. Normal muscle tone.  PSYCHIATRIC: Normal mood and affect. Normal behavior. Cooperative  RADIOLOGY: DG Chest 2 View  Result Date: 05/04/2020 CLINICAL DATA:  Shortness of breath EXAM: CHEST - 2 VIEW COMPARISON:  03/15/2020 FINDINGS: Right subclavian/innominate stent unchanged. Small bilateral effusions. Cardiomegaly with vascular congestion and mild interstitial opacities suspicious for pulmonary edema. More confluent airspace disease at both lung bases. No pneumothorax. IMPRESSION: Cardiomegaly with vascular congestion, mild interstitial opacities suspicious for pulmonary edema. Small bilateral  effusions. More confluent airspace disease at the bases is suspicious for superimposed pneumonia. Electronically Signed   By: Donavan Foil M.D.   On: 05/04/2020 20:57    LABORATORY DATA: Lab Results  Component Value Date   WBC 4.1 05/05/2020   HGB 11.6 (L) 05/05/2020   HCT 36.4 (L) 05/05/2020   MCV 94.8 05/05/2020   PLT 84 (L) 05/05/2020    Recent Labs  Lab 05/05/20 0441  NA 139  K 4.8  CL 97*  CO2 26  BUN 51*  CREATININE 12.16*  CALCIUM 9.5  GLUCOSE 75    Lipid Panel  No results found for: CHOL, TRIG, HDL, CHOLHDL, VLDL, LDLCALC  BNP (last 3 results) No results for input(s): BNP in the last 8760 hours.  HEMOGLOBIN A1C No results found for: HGBA1C, MPG  Cardiac Panel (last 3 results) Recent Labs    05/05/20 0159 05/05/20 0441 05/05/20 0651  TROPONINIHS 139* 135* 149*   Lab Results  Component Value Date   CKTOTAL 136 07/21/2010   CKMB 0.9 07/21/2010     TSH No results for input(s): TSH in the last 8760 hours.  CARDIAC DATABASE: EKG: 05/04/2020: Normal sinus rhythm, 94 bpm, right bundle branch block, consider old anteroseptal infarct, without underlying injury pattern.  Echocardiogram: Pending  Stress Echocardiogram at [facility] 2020-LV[facility], resting EF-40%.  There was some augmentation with exercise up to 45-50 percent.  Severe MR ( it is chronic), trivial AR, mild TR, trivial pericardial effusion.  IMPRESSION & RECOMMENDATIONS: ZENO HICKEL is a 64 y.o. African-American male whose past medical history and cardiovascular risk factors include: End-stage renal disease on hemodialysis, history of CAD status post PCI in [place], hypertension, hypercholesterolemia, history of PE, hepatitis C, anemia, former smoker, history of polysubstance abuse, and history of adenocarcinoma the bladder..  Impression: Shortness of breath: Multifactorial. Precordial pain suggestive of both typical and atypical features. End-stage renal disease on  hemodialysis Established CAD with prior coronary intervention with angina pectoris Elevated troponins most likely secondary to type II MI (supply demand ischemia) Hypercholesterolemia. Hypertension with end-stage renal disease on hemodialysis: Uncontrolled. History of pulmonary embolism. Former smoker. History of polysubstance abuse.  Plan: Cardiology was consulted during this hospitalization for his underlying dyspnea and precordial pain in the setting of elevated troponins. His symptoms on presentation are most likely secondary to his underlying uncontrolled hypertension, missed hemodialysis session leading to volume overload, and in the setting of underlying valvular heart disease / LVH it lead to pulmonary vascular congestion requiring him to be on BiPAP. Troponin leak is most likely secondary to supply demand ischemia.  However, patient has established CAD with prior coronary intervention and has multiple cardiovascular risk factors as noted above and poor outpatient compliance. Cannot completely exclude progressive CAD but acute coronary syndrome is less likely. Patient has missed hemodialysis in the past but that has not caused him to have precordial chest pain like this time. Discussed with the patient that he does need ischemic evaluation by either undergoing stress test or left heart catheterization. After discussing the pros and cons of each testing modality the shared decision was to proceed with left heart catheterization with possible intervention to rule out progressive CAD.  The procedure of left heart catheterization with possible intervention was explained to the patient in detail. The indication, alternatives, risks and benefits were reviewed.  Complications include but not limited to bleeding, infection, vascular injury, stroke, myocardial infection, arrhythmia, kidney injury, radiation-related injury in the case of prolonged fluoroscopy use, emergency cardiac surgery, and death.  The patient understands the risks of serious complication is 1-2 in 3474 with diagnostic cardiac cath and 1-2% or less with angioplasty/stenting.  The patient  voices understanding and provides verbal feedback and wishes to proceed with coronary angiography with possible PCI.  Recommend trending troponins until they peak.  We will add BNP to the morning labs.  Anticipate that his blood pressure will improve after hemodialysis but recommend better blood pressure control. Will start hydralazine 25 mg p.o. 3 times daily.  Echocardiogram pending.  Recommend weaning him off of BiPAP, will defer to primary team.  Plan of care discussed with attending physician and interventional cardiology.   Total encounter time 87 minutes. *Total Encounter Time as defined by the Centers for Medicare and Medicaid Services includes, in addition to the face-to-face time of a patient visit (documented in the note above) non-face-to-face time: obtaining and reviewing outside history, ordering and reviewing medications, tests or procedures, care coordination (communications with other health care professionals or caregivers) and documentation in the medical record.  Patient's questions and concerns were addressed to his satisfaction. He voices understanding of the instructions provided  during this encounter.   This note was created using a voice recognition software as a result there may be grammatical errors inadvertently enclosed that do not reflect the nature of this encounter. Every attempt is made to correct such errors.  Mechele Claude [facility]  Pager: [phone] Office: [phone] 05/05/2020, 12:16 PM

## 2020-05-05 NOTE — Procedures (Signed)
Patient was seen on dialysis and the procedure was supervised.  BFR 400  Via  AVF, BP is 172/90. On Bipap now.  Patient appears to be tolerating treatment well  Logan Martin Tanna Furry 05/05/2020

## 2020-05-05 NOTE — H&P (Signed)
History and Physical    Logan Martin PJK:[id number] DOB: Dec 05, 1955 DOA: 05/04/2020  PCP: [name], NP   Patient coming from:   Home  Chief Complaint: SOB  HPI: Logan Martin is a 64 y.o. male with medical history significant for ESRD, HTN, HLD, anemia, thrombocytopenia, hx of PE, hepatitis C who presents for evaluation of SOB. He has dialysis on Tu, Th and Saturday. He missed his dialysis session on Tuesday this week. He reports having increasing shortness of breath that began Tuesday night and worsened on Wednesday prompting him to come to the emergency room for evaluation he reports he does have a dry nonproductive cough.  He states he has some substernal chest discomfort and pressure but no sharp pain.  He states he is not had any palpitations.  He denies any fevers, chills, diarrhea, abdominal pain, urinary frequency or dysuria.  States he did have nausea and vomiting this morning which is now resolved.  He was placed on BiPAP in the emergency room with improvement in his respiratory status.  He is now breathing stably on BiPAP.  ER provider discussed with nephrology will see patient in the morning and arrange for dialysis.  Patient's initial troponin was elevated at 125 with repeat troponin elevated at 139.  No EKG changes.  Patient denies having any known sick contacts or COVID-19 exposures.  Review of Systems:  General: Denies weakness, fever, chills, weight loss, night sweats.  Denies dizziness.  Denies change in appetite HENT: Denies head trauma, headache, denies change in hearing, tinnitus.  Denies nasal congestion or bleeding.  Denies sore throat, sores in mouth.  Denies difficulty swallowing Eyes: Denies blurry vision, pain in eye, drainage.  Denies discoloration of eyes. Neck: Denies pain.  Denies swelling.  Denies pain with movement. Cardiovascular: Reports substernal chest pain. Denies palpitations.  Denies edema.  Denies orthopnea Respiratory: reports shortness of  breath, Reports dry cough.  Denies wheezing.  Denies sputum production Gastrointestinal: Denies abdominal pain, swelling.  Reports nausea, vomiting which has resolved. Denies diarrhea.  Denies melena.  Denies hematemesis. Musculoskeletal: Denies limitation of movement.  Denies deformity or swelling.  Denies pain.  Denies arthralgias or myalgias. Genitourinary: Denies pelvic pain.  Denies urinary frequency or hesitancy.  Denies dysuria.  Skin: Denies rash.  Denies petechiae, purpura, ecchymosis. Neurological: Denies headache.  Denies syncope.  Denies seizure activity.  Denies weakness or paresthesia.  Denies slurred speech, drooping face.  Denies visual change. Psychiatric: Denies depression, anxiety.  Denies suicidal thoughts or ideation.  Denies hallucinations.  Past Medical History:  Diagnosis Date  . Alcohol abuse    quit date in 1995  . Anemia   . Cancer ([place])    bladder  . Chronic low back pain   . Cocaine abuse in remission ([place])    Quit date in 1995  . ESRD (end stage renal disease) ([place])    Home Dialysis  . 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 Started HD in [place], [place].  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  On NxStage home hemo since 2010. Using RUA AVF.    [name] Hearing aid worn    B/L  . Hepatitis C    s/p treatment with [place]  . History of blood transfusion 2007 X 1  . History of kidney stones   . Hx of constipation   . Hypertension   . Left inguinal hernia   . Peripheral vascular disease ([place])   . Pulmonary embolism (  [facility]) Feb 2012   Treated with coumadin x 1 year  . Wears glasses     Past Surgical History:  Procedure Laterality Date  . A/V FISTULAGRAM N/A 11/22/2017   Procedure: A/V Fistulagram;  Surgeon: Serafina Mitchell, MD;  Location: [facility];  Service: Cardiovascular;  Laterality: N/A;  rt. arm   . ABDOMINAL HERNIA REPAIR  2000    x 2  . AV FISTULA PLACEMENT Right 2007   upper arm  . AV FISTULA  REPAIR Right ~ 03/2016 & 04/2016  . BLADDER AUGMENTATION  1975  . COLONOSCOPY    . CORONARY ANGIOPLASTY WITH STENT PLACEMENT  1999  . CYSTOSCOPY W/ URETERAL STENT PLACEMENT N/A 10/13/2017   Procedure: CYSTOSCOPY WITH IRRIGATION OF BLADDER PLACEMENT OF FOLEY;  Surgeon: Ceasar Mons, MD;  Location: [facility];  Service: Urology;  Laterality: N/A;  . CYSTOSCOPY WITH FULGERATION N/A 03/27/2018   Procedure: [place] AND MUCOUS EVACUATION, BLADDER BIOPSY;  Surgeon: Franchot Gallo, MD;  Location: [facility];  Service: Urology;  Laterality: N/A;  . ESOPHAGOGASTRODUODENOSCOPY    . EXTRACORPOREAL SHOCK WAVE LITHOTRIPSY    . FISTULA SUPERFICIALIZATION Right 01/28/2670   Procedure: PLICATION OF ANEURYSM OF ARTERIOVENOUS FISTULA;  Surgeon: Angelia Mould, MD;  Location: [facility];  Service: Vascular;  Laterality: Right;  . HERNIA REPAIR    . INGUINAL HERNIA REPAIR Right 1975  . INGUINAL HERNIA REPAIR Left 03/04/2019   Procedure: LEFT OPEN INGUINAL HERNIA REPAIR WITH MESH;  Surgeon: Kinsinger, Arta Bruce, MD;  Location: [place];  Service: General;  Laterality: Left;  GENERAL AND LMA  . INSERTION OF DIALYSIS CATHETER Left 03/15/2020   Procedure: INSERTION OF Left Internal Jugular DIALYSIS CATHETER;  Surgeon: [name], MD;  Location: [facility];  Service: Vascular;  Laterality: Left;  . IR FLUORO GUIDE CV LINE LEFT  03/11/2020  . IR US GUIDE VASC ACCESS LEFT  03/11/2020  . PERIPHERAL VASCULAR BALLOON ANGIOPLASTY  11/22/2017   Procedure: PERIPHERAL VASCULAR BALLOON ANGIOPLASTY;  Surgeon: Serafina Mitchell, MD;  Location: [facility];  Service: Cardiovascular;;  rt. arm fistula  . PERIPHERAL VASCULAR CATHETERIZATION N/A 06/22/2016   Procedure: Left Arm Venography;  Surgeon: Elam Dutch, MD;  Location: [facility];  Service: Cardiovascular;  Laterality: N/A;  . PERIPHERAL VASCULAR CATHETERIZATION N/A 06/22/2016   Procedure: A/V Fistulagram - Right Arm;  Surgeon: Elam Dutch, MD;  Location: [facility];  Service: Cardiovascular;  Laterality: N/A;  . PERIPHERAL VASCULAR CATHETERIZATION Right 06/22/2016   Procedure: Peripheral Vascular Balloon Angioplasty;  Surgeon: Elam Dutch, MD;  Location: [facility];  Service: Cardiovascular;  Laterality: Right;  arm fistula  . REVISON OF ARTERIOVENOUS FISTULA Right 03/15/2020   Procedure: RIGHT ARM ARTERIOVENOUS FISTULA REVISON;  Surgeon: [name], MD;  Location: [facility];  Service: Vascular;  Laterality: Right;    Social History  reports that he quit smoking about 26 years ago. His smoking use included cigarettes. He has a 5.00 pack-year smoking history. He has never used smokeless tobacco. He reports previous alcohol use. He reports previous drug use. Drugs: Marijuana and Cocaine.  Allergies  Allergen Reactions  . Heparin Rash    Pork products   . Pork-Derived Products Rash  . Sulfa Antibiotics Rash    Family History  Problem Relation Age of Onset  . Cancer Mother   . Diabetes Mother   . Hypertension Mother   . Deep vein thrombosis Daughter  Prior to Admission medications   Medication Sig Start Date End Date Taking? Authorizing Provider  Doxercalciferol (HECTOROL IV) 1 Dose. Dialysis med 04/16/20 04/15/21 Yes [provider]  Iron Sucrose (VENOFER IV) 1 Dose. Dialysis med 04/07/20 04/06/21 Yes [provider]  Methoxy PEG-Epoetin Beta (MIRCERA IJ) 1 Dose. Dialysis med 04/07/20 04/06/21 Yes [provider]  amLODipine (NORVASC) 10 MG tablet Take 1 tablet (10 mg total) by mouth daily. 03/18/20   Aline August, MD  aspirin EC 81 MG tablet Take 81 mg by mouth at bedtime.     [provider]  atorvastatin (LIPITOR) 40 MG tablet Take 40 mg by mouth daily. 01/20/20   [provider]  AURYXIA 1 GM 210 MG(Fe) tablet Take 420 mg by mouth 3 (three) times daily. 02/12/20   [provider]  calcitRIOL (ROCALTROL) 0.5 MCG capsule Take 1.5  mcg by mouth daily. 10/25/17   [provider]  isosorbide mononitrate (IMDUR) 30 MG 24 hr tablet Take 30 mg by mouth daily.  08/15/18 03/10/20  [provider]  lidocaine-prilocaine (EMLA) cream Apply 1 application topically See admin instructions. Apply small amount to dialysis port (AVF) one hour before dialysis. Cover with occlusive dressing (saran wrap) 11/14/17   [provider]  metoprolol succinate (TOPROL-XL) 50 MG 24 hr tablet Take 50 mg by mouth daily. 02/16/20   [provider]  oxyCODONE (OXY IR/ROXICODONE) 5 MG immediate release tablet Take 5 mg by mouth 2 (two) times daily as needed. 04/29/20   [provider]  oxycodone (ROXICODONE) 30 MG immediate release tablet Take 30 mg by mouth 3 (three) times daily.  08/26/18   [provider]    Physical Exam: Vitals:   05/05/20 0130 05/05/20 0200 05/05/20 0230 05/05/20 0310  BP: (!) 191/94 (!) 194/90 (!) 195/92   Pulse:  96    Resp: 11 20 (!) 23 (!) 25  Temp:  98.3 F (36.8 C)    TempSrc:  Axillary    SpO2:  97%  97%  Weight:      Height:        Constitutional: NAD, calm, comfortable Vitals:   05/05/20 0130 05/05/20 0200 05/05/20 0230 05/05/20 0310  BP: (!) 191/94 (!) 194/90 (!) 195/92   Pulse:  96    Resp: 11 20 (!) 23 (!) 25  Temp:  98.3 F (36.8 C)    TempSrc:  Axillary    SpO2:  97%  97%  Weight:      Height:       General: WDWN, Alert and oriented x3.  Resting comfortably on BiPAP Eyes: EOMI, PERRL, conjunctivae normal.  Sclera nonicteric HENT:  [place]/AT, external ears normal.  Nares patent without epistasis. No lesions of external mouth.  Neck: Soft, normal range of motion, supple, no masses, no thyromegaly.  Trachea midline Respiratory: Equal breath sounds with diffuse rales. no wheezing, no crackles. Normal respiratory effort. No accessory muscle use.  Cardiovascular: Regular rate and rhythm, no murmurs / rubs / gallops.    Abdomen: Soft, no tenderness, nondistended,  no rebound or guarding. No masses palpated. Bowel sounds normoactive Musculoskeletal: FROM. Has clubbing of digits. No cyanosis. No joint deformity upper and lower extremities.  Normal muscle tone.  Skin: Warm, dry, intact no rashes, lesions, ulcers. No induration Neurologic: CN 2-12 grossly intact.  Normal speech.  Sensation intact Psychiatric: Normal judgment and insight.  Normal mood.    Labs on Admission: I have personally reviewed following labs and imaging studies  CBC:  Recent Labs  Lab 05/04/20 2034 05/05/20 0053  WBC 4.5  --   HGB 11.5* 11.9*  HCT 37.7* 35.0*  MCV 95.9  --   PLT 109*  --     Basic Metabolic Panel: Recent Labs  Lab 05/04/20 2034 05/05/20 0053  NA 134* 136  K 4.6 4.7  CL 96*  --   CO2 24  --   GLUCOSE 84  --   BUN 54*  --   CREATININE 11.52*  --   CALCIUM 9.4  --     GFR: Estimated Creatinine Clearance: 4.6 mL/min (A) (by C-G formula based on SCr of 11.52 mg/dL (H)).  Liver Function Tests: No results for input(s): AST, ALT, ALKPHOS, BILITOT, PROT, ALBUMIN in the last 168 hours.  Urine analysis:    Component Value Date/Time   COLORURINE STRAW (A) 03/28/2018 0300   APPEARANCEUR CLEAR 03/28/2018 0300   LABSPEC 1.000 (L) 03/28/2018 0300   PHURINE 6.0 03/28/2018 0300   GLUCOSEU NEGATIVE 03/28/2018 0300   HGBUR NEGATIVE 03/28/2018 0300   BILIRUBINUR NEGATIVE 03/28/2018 0300   KETONESUR NEGATIVE 03/28/2018 0300   PROTEINUR NEGATIVE 03/28/2018 0300   NITRITE NEGATIVE 03/28/2018 0300   LEUKOCYTESUR NEGATIVE 03/28/2018 0300    Radiological Exams on Admission: DG Chest 2 View  Result Date: 05/04/2020 CLINICAL DATA:  Shortness of breath EXAM: CHEST - 2 VIEW COMPARISON:  03/15/2020 FINDINGS: Right subclavian/innominate stent unchanged. Small bilateral effusions. Cardiomegaly with vascular congestion and mild interstitial opacities suspicious for pulmonary edema. More confluent airspace disease at both lung bases. No pneumothorax. IMPRESSION:  Cardiomegaly with vascular congestion, mild interstitial opacities suspicious for pulmonary edema. Small bilateral effusions. More confluent airspace disease at the bases is suspicious for superimposed pneumonia. Electronically Signed   By: Donavan Foil M.D.   On: 05/04/2020 20:57    EKG: Independently reviewed.  EKG shows normal sinus rhythm with an intraventricular conduction block.  No acute ST elevation or depression.  QTc is prolonged at 564  Assessment/Plan Principal Problem:   ESRD on dialysis [facility]) Logan Martin will be admitted to medical telemetry floor.  He missed his dialysis session on Tuesday of this week resulting in volume overload with shortness of breath and edema. Nephrology has been consulted will see patient in the morning and arrange for dialysis  Active Problems:   CAD (coronary atherosclerotic disease) Patient with chest pressure and episode of nausea and vomiting on the morning of May 04, 2020.  Has known CAD.  Continue home medications of aspirin, atorvastatin, metoprolol    Chest discomfort Nitroglycerin be provided as needed for chest pain Troponin levels were elevated will be monitored No acute EKG changes    Elevated troponin Troponins elevated greater than 125.  Second troponin is 139.  We will check serial troponins through the night    Thrombocytopenia (HCC) Stable thrombocytopenia.  No bleeding    Anemia in ESRD (end-stage renal disease) (HCC) Stable anemia in setting of end-stage renal disease Continue iron as patient has history of iron deficiency anemia as well    Prolonged QT interval Avoid medications which can further prolong his QT interval.  Monitor on telemetry   DVT prophylaxis: SCDs for DVT prophylaxis with heparin allergy and low platelet level.  Code Status:   Full code Family Communication:  Diagnosis and plan discussed with patient.  He verbalized understanding agrees with plan.  Further recommendations to follow as clinically  indicated Disposition Plan:   Patient is from:  Home  Anticipated DC to:  Home  Anticipated DC date:  Anticipate 2 midnights in hospital to treat acute medical condition  Anticipated DC barriers: No barriers to discharge identified at this time  Consults called:  Nephrology. Was consulted by ER provider and will see pt in am.  Admission status:  Inpatient   Yevonne Aline Sophi Calligan MD [facility]  How to contact the [facility] Attending or Consulting provider [place] or covering provider during after hours [place], for this patient?   1. Check the care team in [facility] and look for a) attending/consulting [facility] provider listed and b) the [facility] team listed 2. Log into [email or web address] and use [place]'s universal password to access. If you do not have the password, please contact the hospital operator. 3. Locate the [facility] provider you are looking for under [facility] and page to a number that you can be directly reached. 4. If you still have difficulty reaching the provider, please page the [facility] (Director on Call) for the Hospitalists listed on amion for assistance.  05/05/2020, 3:25 AM

## 2020-05-05 NOTE — ED Notes (Signed)
Breakfast ordered 

## 2020-05-05 NOTE — Progress Notes (Signed)
PROGRESS NOTE   Logan Martin  ASN:[id number]    DOB: [birth year]    DOA: 05/04/2020  PCP: [name], NP   I have briefly reviewed patients previous medical records in [facility].  Chief Complaint  Patient presents with  . Shortness of Breath    Brief Narrative:  64 year old male, lives with his grandson, ambulates with the help of a walker, past medical history significant for ESRD on TTS HD, HTN, HLD, CAD s/p PCI, cardiomyopathy, anemia, thrombocytopenia, PE, hepatitis C, prior polysubstance abuse (alcohol, cocaine), PAD, hard of hearing, radical cystectomy with bilateral lymphadenectomy and radical prostatectomy in 2020 at [facility] for adenocarcinoma of the bladder presented to the ED with worsening dyspnea, some substernal chest discomfort and pressure, nonproductive cough, after missing hemodialysis on 05/03/2020.  In the ED noted to be tachycardic and in respiratory distress and placed on BiPAP.  Admitted for acute respiratory distress due to acute pulmonary edema/acute on chronic systolic CHF likely due to missed HD and for chest pain evaluation.  Nephrology and cardiology were consulted.   Assessment & Plan:  Principal Problem:   ESRD on dialysis [facility]) Active Problems:   CAD (coronary atherosclerotic disease)   Chest discomfort   Thrombocytopenia (HCC)   Anemia in ESRD (end-stage renal disease) (HCC)   Elevated troponin   Prolonged QT interval   Acute respiratory distress due to acute pulmonary edema/acute on chronic systolic CHF, likely precipitated by missed hemodialysis:  Patient with acute respiratory distress on admission.  ABG: pH 7.486, PCO2 37.2, PO2 82, HCO3 28.3 and oxygen saturation 97%.  Did not see true hypoxia documented  Chest x-ray personally reviewed and consistent with pulmonary edema.  Overnight in the ED placed on BiPAP continuously with improvement.  Nephrology consulted and patient undergoing hemodialysis today.  Wean off BiPAP and  oxygen as tolerated.  CAD s/p PCI, cardiomyopathy, chest pain, elevated troponin possibly from demand ischemia:  Echo stress test 08/01/2018 at [facility] showed LVEF 30%, indeterminate with abnormal resting study and no change with stress.  Trivial AR, severe MR, trivial PR, mild TR.  No valvular stenosis.  Follows with outpatient Cardiology/Dr. Woody Seller in [place].  HS troponins mildly elevated with flat trend, 125 > 139 > 135 > 149, not consistent with ACS.  Cardiology consulted.  I discussed in detail with Dr. Terri Skains who advised that his presentation is concerning for ischemia and recommends cardiac cath and the patient reportedly is agreeable.  Requested updated 2D echo.  Continue aspirin, statins and Toprol-XL.  ESRD on TTS HD  Nephrology consulted and supposed to have hemodialysis today and again tomorrow secondary to missed hemodialysis.  Essential hypertension  Uncontrolled BP likely precipitated by volume overload and acute respiratory distress.  Volume management across hemodialysis.  Continue prior home antihypertensives including amlodipine and Toprol-XL.  Monitor closely and adjust meds as needed.  Hyperlipidemia:  Continue statins.  Anemia in ESRD  Hemoglobin stable in the 11 g range.  No EPA indicated.  Thrombocytopenia  Appears chronic and fluctuating.    Follow CBC periodically.  No bleeding reported.  Prolonged QTC  QTC by EKG 12/8: 564 ms.  However not sure if this is accurate given IVCD and BBB morphology.  Minimize QT prolonging medications.   Body mass index is 19.34 kg/m.   DVT prophylaxis: SCDs Start: 05/05/20 0420     Code Status: Full Code Family Communication: None at bedside. Disposition:  Status is: Inpatient  Remains inpatient appropriate because:Inpatient level of care appropriate due to severity  of illness   Dispo: The patient is from: Home              Anticipated d/c is to: Home              Anticipated d/c date is: 2 days               Patient currently is not medically stable to d/c.        Consultants:   Nephrology Cardiology  Procedures:   Hemodialysis  Antimicrobials:    Anti-infectives (From admission, onward)   None        Subjective:  When seen this morning in ED.  Still on BiPAP.  Reported dyspnea significantly improved.  No further chest pain.  Unclear as to why he missed his dialysis.  No other complaints reported.  Objective:   Vitals:   05/05/20 1000 05/05/20 1030 05/05/20 1100 05/05/20 1130  BP: (!) 197/81 (!) 199/88 (!) 190/90 (!) 175/88  Pulse: (!) 113 (!) 102 (!) 105 (!) 110  Resp: 19 (!) 21 (!) 25 (!) 24  Temp:      TempSrc:      SpO2: 100% 98% 98% 97%  Weight:      Height:        General exam: Middle-age male, moderately built and thinly nourished lying comfortably propped up in bed without distress with BiPAP on. Respiratory system: Occasional fine basal crackles but otherwise clear to auscultation.  No increased work of breathing. Cardiovascular system: S1 & S2 heard, RRR.  JVD +.  Pansystolic murmur best heard at apex.  No gallops or pedal edema. Gastrointestinal system: Abdomen is nondistended, soft and nontender. No organomegaly or masses felt. Normal bowel sounds heard.  Midline laparotomy scar, healed with secondary intention Central nervous system: Alert and oriented. No focal neurological deficits. Extremities: Symmetric 5 x 5 power.  Right upper arm AV fistula with thrill. Skin: No rashes, lesions or ulcers Psychiatry: Judgement and insight appear normal. Mood & affect appropriate.     Data Reviewed:   I have personally reviewed following labs and imaging studies   CBC: Recent Labs  Lab 05/04/20 2034 05/05/20 0053 05/05/20 0441  WBC 4.5  --  4.1  HGB 11.5* 11.9* 11.6*  HCT 37.7* 35.0* 36.4*  MCV 95.9  --  94.8  PLT 109*  --  84*    Basic Metabolic Panel: Recent Labs  Lab 05/04/20 2034 05/05/20 0053 05/05/20 0441  NA 134* 136 139  K 4.6 4.7  4.8  CL 96*  --  97*  CO2 24  --  26  GLUCOSE 84  --  75  BUN 54*  --  51*  CREATININE 11.52*  --  12.16*  CALCIUM 9.4  --  9.5    Liver Function Tests: No results for input(s): AST, ALT, ALKPHOS, BILITOT, PROT, ALBUMIN in the last 168 hours.  CBG: No results for input(s): GLUCAP in the last 168 hours.  Microbiology Studies:   Recent Results (from the past 240 hour(s))  Resp Panel by RT-PCR (Flu A&B, Covid) Nasopharyngeal Swab     Status: None   Collection Time: 05/05/20 12:50 AM   Specimen: Nasopharyngeal Swab; Nasopharyngeal(NP) swabs in vial transport medium  Result Value Ref Range Status   SARS Coronavirus 2 by RT PCR NEGATIVE NEGATIVE Final    Comment: (NOTE) SARS-CoV-2 target nucleic acids are NOT DETECTED.  The SARS-CoV-2 RNA is generally detectable in upper respiratory specimens during the acute phase of infection. The lowest  concentration of SARS-CoV-2 viral copies this assay can detect is 138 copies/mL. A negative result does not preclude SARS-Cov-2 infection and should not be used as the sole basis for treatment or other patient management decisions. A negative result may occur with  improper specimen collection/handling, submission of specimen other than nasopharyngeal swab, presence of viral mutation(s) within the areas targeted by this assay, and inadequate number of viral copies(<138 copies/mL). A negative result must be combined with clinical observations, patient history, and epidemiological information. The expected result is Negative.  Fact Sheet for Patients:  EntrepreneurPulse.com.au  Fact Sheet for Healthcare Providers:  IncredibleEmployment.be  This test is no t yet approved or cleared by the [place] FDA and  has been authorized for detection and/or diagnosis of SARS-CoV-2 by FDA under an Emergency Use Authorization (EUA). This EUA will remain  in effect (meaning this test can be used) for the duration of  the COVID-19 declaration under Section 564(b)(1) of the Act, 21 U.S.C.section 360bbb-3(b)(1), unless the authorization is terminated  or revoked sooner.       Influenza A by PCR NEGATIVE NEGATIVE Final   Influenza B by PCR NEGATIVE NEGATIVE Final    Comment: (NOTE) The Xpert Xpress SARS-CoV-2/FLU/RSV plus assay is intended as an aid in the diagnosis of influenza from Nasopharyngeal swab specimens and should not be used as a sole basis for treatment. Nasal washings and aspirates are unacceptable for Xpert Xpress SARS-CoV-2/FLU/RSV testing.  Fact Sheet for Patients: EntrepreneurPulse.com.au  Fact Sheet for Healthcare Providers: IncredibleEmployment.be  This test is not yet approved or cleared by the [place] FDA and has been authorized for detection and/or diagnosis of SARS-CoV-2 by FDA under an Emergency Use Authorization (EUA). This EUA will remain in effect (meaning this test can be used) for the duration of the COVID-19 declaration under Section 564(b)(1) of the Act, 21 U.S.C. section 360bbb-3(b)(1), unless the authorization is terminated or revoked.  Performed at [facility], [place] [address], [place], [place] [address]      Radiology Studies:  DG Chest 2 View  Result Date: 05/04/2020 CLINICAL DATA:  Shortness of breath EXAM: CHEST - 2 VIEW COMPARISON:  03/15/2020 FINDINGS: Right subclavian/innominate stent unchanged. Small bilateral effusions. Cardiomegaly with vascular congestion and mild interstitial opacities suspicious for pulmonary edema. More confluent airspace disease at both lung bases. No pneumothorax. IMPRESSION: Cardiomegaly with vascular congestion, mild interstitial opacities suspicious for pulmonary edema. Small bilateral effusions. More confluent airspace disease at the bases is suspicious for superimposed pneumonia. Electronically Signed   By: Donavan Foil M.D.   On: 05/04/2020 20:57     Scheduled Meds:   .  amLODipine  10 mg Oral Daily  . aspirin EC  81 mg Oral QHS  . atorvastatin  40 mg Oral Daily  . calcitRIOL  1.5 mcg Oral Daily  . Chlorhexidine Gluconate Cloth  6 each Topical Q0600  . ferric citrate  420 mg Oral TID WC  . metoprolol succinate  50 mg Oral Daily  . oxyCODONE        Continuous Infusions:   . sodium chloride    . sodium chloride       LOS: 0 days     Vernell Leep, MD, [place], [facility]. [facility]    To contact the attending provider between 7A-7P or the covering provider during after hours 7P-7A, please log into the web site [email or web address] and access using universal [place] password for that web site. If you do not have the password, please  call the hospital operator.  05/05/2020, 12:34 PM

## 2020-05-05 NOTE — ED Notes (Signed)
Pt placed in ED room 45

## 2020-05-05 NOTE — Progress Notes (Signed)
  Echocardiogram 2D Echocardiogram has been performed.  Logan Martin 05/05/2020, 3:28 PM

## 2020-05-05 NOTE — Consult Note (Signed)
CARDIOLOGY CONSULT NOTE  Patient ID: Logan Martin MRN: [id number] DOB/AGE: 02/23/1956 64 y.o.  Admit date: 05/04/2020 Attending physician: Modena Jansky, MD  Primary Physician:  [name], NP   Former cardiologist: Dr. Vear Clock Inpatient Cardiologist: Rex Kras, DO, [facility] Requesting Physician: Modena Jansky, MD   Chief complaint: Chest pain and shortness of breath Reason of consultation: Chest pain and elevated troponins  HPI:  Logan Martin is a 64 y.o. African-American male who presents with a chief complaint of " chest pain and shortness of breath." His past medical history and cardiovascular risk factors include: End-stage renal disease on hemodialysis, history of CAD status post PCI in [place], hypertension, hypercholesterolemia, history of PE, hepatitis C, anemia, former smoker, history of polysubstance abuse, and history of adenocarcinoma the bladder.  His predominant reason of coming to the hospital was shortness of breath. Patient states that in the past he would miss several hemodialysis session prior to becoming short of breath. However, this time he only missed one session of hemodialysis and has been experiencing significant dyspnea and chest discomfort.  Patient states that the chest pain is a pressure-like sensation, located substernally, intensity 8 out of 10, at times worse with effort related activities, does not resolve and usually self-limited. He has not used sublingual nitroglycerin tablets. He denies orthopnea, paroxysmal nocturnal dyspnea or lower extremity swelling.  Since hospitalization has been placed on BiPAP and his shortness of breath and chest pain have improved. High sensitive troponins have been essentially flat and last one was 149. EKG shows normal sinus rhythm without underlying injury pattern. Serum creatinine on admission was 12.16. And his blood pressures yesterday around midnight were 190/91.  ALLERGIES: Allergies  Allergen  Reactions  . Heparin Rash    Pork products   . Pork-Derived Products Rash  . Sulfa Antibiotics Rash    PAST MEDICAL HISTORY: Past Medical History:  Diagnosis Date  . Alcohol abuse    quit date in 1995  . Anemia   . Cancer ([place])    bladder  . Chronic low back pain   . Cocaine abuse in remission ([place])    Quit date in 1995  . ESRD (end stage renal disease) ([place])    Home Dialysis  . 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 Started HD in [place], [place].  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  On NxStage home hemo since 2010. Using RUA AVF.    [name] Hearing aid worn    B/L  . Hepatitis C    s/p treatment with [place]  . History of blood transfusion 2007 X 1  . History of kidney stones   . Hx of constipation   . Hypertension   . Left inguinal hernia   . Peripheral vascular disease ([place])   . Pulmonary embolism [facility]) Feb 2012   Treated with coumadin x 1 year  . Wears glasses     PAST SURGICAL HISTORY: Past Surgical History:  Procedure Laterality Date  . A/V FISTULAGRAM N/A 11/22/2017   Procedure: A/V Fistulagram;  Surgeon: Serafina Mitchell, MD;  Location: [place] [facility];  Service: Cardiovascular;  Laterality: N/A;  rt. arm   . ABDOMINAL HERNIA REPAIR  2000    x 2  . AV FISTULA PLACEMENT Right 2007   upper arm  . AV FISTULA REPAIR Right ~ 03/2016 & 04/2016  . BLADDER AUGMENTATION  1975  . COLONOSCOPY    . CORONARY ANGIOPLASTY WITH  [place]  . CYSTOSCOPY W/ URETERAL STENT PLACEMENT N/A 10/13/2017   Procedure: CYSTOSCOPY WITH IRRIGATION OF BLADDER PLACEMENT OF FOLEY;  Surgeon: Ceasar Mons, MD;  Location: [place];  Service: Urology;  Laterality: N/A;  . CYSTOSCOPY WITH FULGERATION N/A 03/27/2018   Procedure: [place] AND MUCOUS EVACUATION, BLADDER BIOPSY;  Surgeon: Franchot Gallo, MD;  Location: [facility];  Service: Urology;  Laterality: N/A;  . ESOPHAGOGASTRODUODENOSCOPY    .  EXTRACORPOREAL SHOCK WAVE LITHOTRIPSY    . FISTULA SUPERFICIALIZATION Right 05/29/4578   Procedure: PLICATION OF ANEURYSM OF ARTERIOVENOUS FISTULA;  Surgeon: Angelia Mould, MD;  Location: [place];  Service: Vascular;  Laterality: Right;  . HERNIA REPAIR    . INGUINAL HERNIA REPAIR Right 1975  . INGUINAL HERNIA REPAIR Left 03/04/2019   Procedure: LEFT OPEN INGUINAL HERNIA REPAIR WITH MESH;  Surgeon: Kinsinger, Arta Bruce, MD;  Location: [place];  Service: General;  Laterality: Left;  GENERAL AND LMA  . INSERTION OF DIALYSIS CATHETER Left 03/15/2020   Procedure: INSERTION OF Left Internal Jugular DIALYSIS CATHETER;  Surgeon: [name], MD;  Location: [place];  Service: Vascular;  Laterality: Left;  . IR FLUORO GUIDE CV LINE LEFT  03/11/2020  . IR US GUIDE VASC ACCESS LEFT  03/11/2020  . PERIPHERAL VASCULAR BALLOON ANGIOPLASTY  11/22/2017   Procedure: PERIPHERAL VASCULAR BALLOON ANGIOPLASTY;  Surgeon: Serafina Mitchell, MD;  Location: [place] [facility];  Service: Cardiovascular;;  rt. arm fistula  . PERIPHERAL VASCULAR CATHETERIZATION N/A 06/22/2016   Procedure: Left Arm Venography;  Surgeon: Elam Dutch, MD;  Location: [place] [facility];  Service: Cardiovascular;  Laterality: N/A;  . PERIPHERAL VASCULAR CATHETERIZATION N/A 06/22/2016   Procedure: A/V Fistulagram - Right Arm;  Surgeon: Elam Dutch, MD;  Location: [place] [facility];  Service: Cardiovascular;  Laterality: N/A;  . PERIPHERAL VASCULAR CATHETERIZATION Right 06/22/2016   Procedure: Peripheral Vascular Balloon Angioplasty;  Surgeon: Elam Dutch, MD;  Location: [place] [facility];  Service: Cardiovascular;  Laterality: Right;  arm fistula  . REVISON OF ARTERIOVENOUS FISTULA Right 03/15/2020   Procedure: RIGHT ARM ARTERIOVENOUS FISTULA REVISON;  Surgeon: [name], MD;  Location: [facility];  Service: Vascular;  Laterality: Right;    FAMILY HISTORY: The patient family history includes Cancer in his mother; Deep vein  thrombosis in his daughter; Diabetes in his mother; Hypertension in his mother.   SOCIAL HISTORY:  The patient  reports that he quit smoking about 26 years ago. His smoking use included cigarettes. He has a 5.00 pack-year smoking history. He has never used smokeless tobacco. He reports previous alcohol use. He reports previous drug use. Drugs: Marijuana and Cocaine.  MEDICATIONS: Current Outpatient Medications  Medication Instructions  . amLODipine (NORVASC) 10 mg, Oral, Daily  . aspirin EC 81 mg, Oral, Daily at bedtime  . atorvastatin (LIPITOR) 40 mg, Oral, Daily  . Auryxia 420 mg, Oral, 3 times daily  . calcitRIOL (ROCALTROL) 1.5 mcg, Oral, Daily  . Doxercalciferol (HECTOROL IV) 1 Dose. Dialysis med  . Iron Sucrose (VENOFER IV) 1 Dose. Dialysis med  . isosorbide mononitrate (IMDUR) 30 mg, Oral, Daily  . lidocaine-prilocaine (EMLA) cream 1 application, Topical, See admin instructions, Apply small amount to dialysis port (AVF) one hour before dialysis. Cover with occlusive dressing (saran wrap)  . Methoxy PEG-Epoetin Beta (MIRCERA IJ) 1 Dose. Dialysis med  . metoprolol succinate (TOPROL-XL) 50 mg, Oral, Daily  . oxyCODONE (OXY IR/ROXICODONE) 5 mg, Oral,  2 times daily PRN  . oxycodone (ROXICODONE) 30 mg, Oral, 3 times daily    Review of Systems  Constitutional: Negative for chills and fever.  HENT: Negative for hoarse voice and nosebleeds.   Eyes: Negative for discharge, double vision and pain.  Cardiovascular: Positive for chest pain and dyspnea on exertion. Negative for claudication, leg swelling, near-syncope, orthopnea, palpitations, paroxysmal nocturnal dyspnea and syncope.  Respiratory: Positive for shortness of breath. Negative for hemoptysis.   Musculoskeletal: Negative for muscle cramps and myalgias.  Gastrointestinal: Negative for abdominal pain, constipation, diarrhea, hematemesis, hematochezia, melena, nausea and vomiting.  Neurological: Negative for dizziness and  light-headedness.    PHYSICAL EXAM: Vitals with BMI 05/05/2020 05/05/2020 05/05/2020  Height - - -  Weight - - -  BMI - - -  Systolic 332 951 884  Diastolic 88 90 88  Pulse 166 105 102    No intake or output data in the 24 hours ending 05/05/20 1216  Net IO Since Admission: No IO data has been entered for this period [05/05/20 1216]  CONSTITUTIONAL: Appearing older than stated age, hemodynamically stable, currently in dialysis, on BiPAP, no acute distress.  SKIN: Skin is warm and dry. No rash noted. No cyanosis. No pallor. No jaundice HEAD: Normocephalic and atraumatic.  EYES: No scleral icterus MOUTH/THROAT: Dry oral membranes.  NECK: No JVD present. No thyromegaly noted. No carotid bruits  LYMPHATIC: No visible cervical adenopathy.  CHEST Normal respiratory effort. No intercostal retractions  LUNGS: Clear to auscultation in the upper lung fields with rales noted bilaterally.  CARDIOVASCULAR: Regular, positive S1-S2, tachycardia, soft holosystolic murmur heard at the apex, no gallops or rubs ABDOMINAL: Nonobese, soft, nontender, nondistended, positive bowel sounds in all 4 quadrants, no apparent ascites.  EXTREMITIES: No peripheral edema. Cool to touch bilaterally. Pulses difficult to appreciate. HEMATOLOGIC: No significant bruising NEUROLOGIC: Oriented to person, place, and time. Nonfocal. Normal muscle tone.  PSYCHIATRIC: Normal mood and affect. Normal behavior. Cooperative  RADIOLOGY: DG Chest 2 View  Result Date: 05/04/2020 CLINICAL DATA:  Shortness of breath EXAM: CHEST - 2 VIEW COMPARISON:  03/15/2020 FINDINGS: Right subclavian/innominate stent unchanged. Small bilateral effusions. Cardiomegaly with vascular congestion and mild interstitial opacities suspicious for pulmonary edema. More confluent airspace disease at both lung bases. No pneumothorax. IMPRESSION: Cardiomegaly with vascular congestion, mild interstitial opacities suspicious for pulmonary edema. Small bilateral  effusions. More confluent airspace disease at the bases is suspicious for superimposed pneumonia. Electronically Signed   By: Donavan Foil M.D.   On: 05/04/2020 20:57    LABORATORY DATA: Lab Results  Component Value Date   WBC 4.1 05/05/2020   HGB 11.6 (L) 05/05/2020   HCT 36.4 (L) 05/05/2020   MCV 94.8 05/05/2020   PLT 84 (L) 05/05/2020    Recent Labs  Lab 05/05/20 0441  NA 139  K 4.8  CL 97*  CO2 26  BUN 51*  CREATININE 12.16*  CALCIUM 9.5  GLUCOSE 75    Lipid Panel  No results found for: CHOL, TRIG, HDL, CHOLHDL, VLDL, LDLCALC  BNP (last 3 results) No results for input(s): BNP in the last 8760 hours.  HEMOGLOBIN A1C No results found for: HGBA1C, MPG  Cardiac Panel (last 3 results) Recent Labs    05/05/20 0159 05/05/20 0441 05/05/20 0651  TROPONINIHS 139* 135* 149*   Lab Results  Component Value Date   CKTOTAL 136 07/21/2010   CKMB 0.9 07/21/2010     TSH No results for input(s): TSH in the last 8760 hours.  CARDIAC DATABASE: EKG: 05/04/2020: Normal sinus rhythm, 94 bpm, right bundle branch block, consider old anteroseptal infarct, without underlying injury pattern.  Echocardiogram: Pending  Stress Echocardiogram [facility], resting EF-40%.  There was some augmentation with exercise up to 45-50 percent.  Severe MR ( it is chronic), trivial AR, mild TR, trivial pericardial effusion.  IMPRESSION & RECOMMENDATIONS: ADONNIS SALCEDA is a 64 y.o. African-American male whose past medical history and cardiovascular risk factors include: End-stage renal disease on hemodialysis, history of CAD status post PCI in [place], hypertension, hypercholesterolemia, history of PE, hepatitis C, anemia, former smoker, history of polysubstance abuse, and history of adenocarcinoma the bladder..  Impression: Shortness of breath: Multifactorial. Precordial pain suggestive of both typical and atypical features. End-stage renal disease on  hemodialysis Established CAD with prior coronary intervention with angina pectoris Elevated troponins most likely secondary to type II MI (supply demand ischemia) Hypercholesterolemia. Hypertension with end-stage renal disease on hemodialysis: Uncontrolled. History of pulmonary embolism. Former smoker. History of polysubstance abuse.  Plan: Cardiology was consulted during this hospitalization for his underlying dyspnea and precordial pain in the setting of elevated troponins. His symptoms on presentation are most likely secondary to his underlying uncontrolled hypertension, missed hemodialysis session leading to volume overload, and in the setting of underlying valvular heart disease / LVH it lead to pulmonary vascular congestion requiring him to be on BiPAP. Troponin leak is most likely secondary to supply demand ischemia.  However, patient has established CAD with prior coronary intervention and has multiple cardiovascular risk factors as noted above and poor outpatient compliance. Cannot completely exclude progressive CAD but acute coronary syndrome is less likely. Patient has missed hemodialysis in the past but that has not caused him to have precordial chest pain like this time. Discussed with the patient that he does need ischemic evaluation by either undergoing stress test or left heart catheterization. After discussing the pros and cons of each testing modality the shared decision was to proceed with left heart catheterization with possible intervention to rule out progressive CAD.  The procedure of left heart catheterization with possible intervention was explained to the patient in detail. The indication, alternatives, risks and benefits were reviewed.  Complications include but not limited to bleeding, infection, vascular injury, stroke, myocardial infection, arrhythmia, kidney injury, radiation-related injury in the case of prolonged fluoroscopy use, emergency cardiac surgery, and death.  The patient understands the risks of serious complication is 1-2 in 0258 with diagnostic cardiac cath and 1-2% or less with angioplasty/stenting.  The patient  voices understanding and provides verbal feedback and wishes to proceed with coronary angiography with possible PCI.  Recommend trending troponins until they peak.  We will add BNP to the morning labs.  Anticipate that his blood pressure will improve after hemodialysis but recommend better blood pressure control. Will start hydralazine 25 mg p.o. 3 times daily.  Echocardiogram pending.  Recommend weaning him off of BiPAP, will defer to primary team.  Plan of care discussed with attending physician and interventional cardiology.   Total encounter time 87 minutes. *Total Encounter Time as defined by the Centers for Medicare and Medicaid Services includes, in addition to the face-to-face time of a patient visit (documented in the note above) non-face-to-face time: obtaining and reviewing outside history, ordering and reviewing medications, tests or procedures, care coordination (communications with other health care professionals or caregivers) and documentation in the medical record.  Patient's questions and concerns were addressed to his satisfaction. He voices understanding of the instructions provided  during this encounter.   This note was created using a voice recognition software as a result there may be grammatical errors inadvertently enclosed that do not reflect the nature of this encounter. Every attempt is made to correct such errors.  Mechele Claude [facility]  Pager: [phone] Office: [phone] 05/05/2020, 12:16 PM

## 2020-05-05 NOTE — Progress Notes (Signed)
Patient transported on Bipap from ED to HD without complications.  Assigned RT notified.

## 2020-05-05 NOTE — Progress Notes (Signed)
Patient in no respiratory distress at this time, bipap not needed. Will continue to monitor patient.

## 2020-05-05 NOTE — Consult Note (Addendum)
[facility] Renal Consultation Note  Indication for Consultation:  Management of ESRD/hemodialysis; anemia, hypertension/volume and secondary hyperparathyroidism  HPI: Logan Martin is a 64 y.o. male with ESRD on home HD in recent past (Nx Stage),  but was in temporary outpatient dialysis after needing PermCath after having AV fistula revision.  Also history of with HTN, Hep C, PVD. 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 Home dialysis since 2009.    Patient now admitted with volume overload (having missed outpatient hemodialysis,last HD was 04/26/20 at [facility] ) chest discomfort, elevated troponin, prolonged QT interval.   Labs in the ER sodium 134, K4.6, BUN 54, creatinine 11.52, WBC 4.5, Hgb 11.5 troponin I 25 Covid testing negative Chest x-ray cardiomegaly less congestion changes suspicious for pulmonary edema, small bilateral pleural effusions  Seen currently on hemodialysis after almost 3 L UF now off BiPAP feels better dyspnea and chest pain resolving he denies any weakness chills fever night sweats dizziness abdominal pain nausea vomiting. Evidently missed outpatient dialysis because he thought he would be doing home hemodialysis and not approved yet for home hemo after recent PermCath use in center.      Past Medical History:  Diagnosis Date  . Alcohol abuse    quit date in 1995  . Anemia   . Cancer ([place])    bladder  . Chronic low back pain   . Cocaine abuse in remission ([place])    Quit date in 1995  . ESRD (end stage renal disease) ([place])    Home Dialysis  . 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 Started HD in [place], [place].  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  On NxStage home hemo since 2010. Using RUA AVF.    [name] Hearing aid worn    B/L  . Hepatitis C    s/p treatment with Mavyret  . History of blood transfusion 2007 X 1  . History of kidney stones   . Hx of constipation   .  Hypertension   . Left inguinal hernia   . Peripheral vascular disease ([place])   . Pulmonary embolism [facility]) Feb 2012   Treated with coumadin x 1 year  . Wears glasses     Past Surgical History:  Procedure Laterality Date  . A/V FISTULAGRAM N/A 11/22/2017   Procedure: A/V Fistulagram;  Surgeon: Serafina Mitchell, MD;  Location: [facility];  Service: Cardiovascular;  Laterality: N/A;  rt. arm   . ABDOMINAL HERNIA REPAIR  2000    x 2  . AV FISTULA PLACEMENT Right 2007   upper arm  . AV FISTULA REPAIR Right ~ 03/2016 & 04/2016  . BLADDER AUGMENTATION  1975  . COLONOSCOPY    . CORONARY ANGIOPLASTY WITH STENT PLACEMENT  1999  . CYSTOSCOPY W/ URETERAL STENT PLACEMENT N/A 10/13/2017   Procedure: CYSTOSCOPY WITH IRRIGATION OF BLADDER PLACEMENT OF FOLEY;  Surgeon: Ceasar Mons, MD;  Location: [facility];  Service: Urology;  Laterality: N/A;  . CYSTOSCOPY WITH FULGERATION N/A 03/27/2018   Procedure: [place] AND MUCOUS EVACUATION, BLADDER BIOPSY;  Surgeon: Franchot Gallo, MD;  Location: [facility];  Service: Urology;  Laterality: N/A;  . ESOPHAGOGASTRODUODENOSCOPY    . EXTRACORPOREAL SHOCK WAVE LITHOTRIPSY    . FISTULA SUPERFICIALIZATION Right 08/30/8097   Procedure: PLICATION OF ANEURYSM OF ARTERIOVENOUS FISTULA;  Surgeon: Angelia Mould, MD;  Location: [facility];  Service: Vascular;  Laterality: Right;  . HERNIA  REPAIR    . INGUINAL HERNIA REPAIR Right 1975  . INGUINAL HERNIA REPAIR Left 03/04/2019   Procedure: LEFT OPEN INGUINAL HERNIA REPAIR WITH MESH;  Surgeon: Kinsinger, Arta Bruce, MD;  Location: [facility];  Service: General;  Laterality: Left;  GENERAL AND LMA  . INSERTION OF DIALYSIS CATHETER Left 03/15/2020   Procedure: INSERTION OF Left Internal Jugular DIALYSIS CATHETER;  Surgeon: [name], MD;  Location: [facility];  Service: Vascular;  Laterality: Left;  . IR FLUORO GUIDE CV LINE LEFT  03/11/2020  . IR US GUIDE VASC ACCESS LEFT  03/11/2020  .  PERIPHERAL VASCULAR BALLOON ANGIOPLASTY  11/22/2017   Procedure: PERIPHERAL VASCULAR BALLOON ANGIOPLASTY;  Surgeon: Serafina Mitchell, MD;  Location: [facility];  Service: Cardiovascular;;  rt. arm fistula  . PERIPHERAL VASCULAR CATHETERIZATION N/A 06/22/2016   Procedure: Left Arm Venography;  Surgeon: Elam Dutch, MD;  Location: [facility];  Service: Cardiovascular;  Laterality: N/A;  . PERIPHERAL VASCULAR CATHETERIZATION N/A 06/22/2016   Procedure: A/V Fistulagram - Right Arm;  Surgeon: Elam Dutch, MD;  Location: [facility];  Service: Cardiovascular;  Laterality: N/A;  . PERIPHERAL VASCULAR CATHETERIZATION Right 06/22/2016   Procedure: Peripheral Vascular Balloon Angioplasty;  Surgeon: Elam Dutch, MD;  Location: [facility];  Service: Cardiovascular;  Laterality: Right;  arm fistula  . REVISON OF ARTERIOVENOUS FISTULA Right 03/15/2020   Procedure: RIGHT ARM ARTERIOVENOUS FISTULA REVISON;  Surgeon: [name], MD;  [facility];  Service: Vascular;  Laterality: Right;      Family History  Problem Relation Age of Onset  . Cancer Mother   . Diabetes Mother   . Hypertension Mother   . Deep vein thrombosis Daughter       reports that he quit smoking about 26 years ago. His smoking use included cigarettes. He has a 5.00 pack-year smoking history. He has never used smokeless tobacco. He reports previous alcohol use. He reports previous drug use. Drugs: Marijuana and Cocaine.   Allergies  Allergen Reactions  . Heparin Rash    Pork products   . Pork-Derived Products Rash  . Sulfa Antibiotics Rash    Prior to Admission medications   Medication Sig Start Date End Date Taking? Authorizing Provider  Doxercalciferol (HECTOROL IV) 1 Dose. Dialysis med 04/16/20 04/15/21 Yes [provider]  Iron Sucrose (VENOFER IV) 1 Dose. Dialysis med 04/07/20 04/06/21 Yes [provider]  Methoxy PEG-Epoetin Beta (MIRCERA IJ) 1 Dose. Dialysis  med 04/07/20 04/06/21 Yes [provider]  amLODipine (NORVASC) 10 MG tablet Take 1 tablet (10 mg total) by mouth daily. 03/18/20   Aline August, MD  aspirin EC 81 MG tablet Take 81 mg by mouth at bedtime.     [provider]  atorvastatin (LIPITOR) 40 MG tablet Take 40 mg by mouth daily. 01/20/20   [provider]  AURYXIA 1 GM 210 MG(Fe) tablet Take 420 mg by mouth 3 (three) times daily. 02/12/20   [provider]  calcitRIOL (ROCALTROL) 0.5 MCG capsule Take 1.5 mcg by mouth daily. 10/25/17   [provider]  isosorbide mononitrate (IMDUR) 30 MG 24 hr tablet Take 30 mg by mouth daily.  08/15/18 03/10/20  [provider]  lidocaine-prilocaine (EMLA) cream Apply 1 application topically See admin instructions. Apply small amount to dialysis port (AVF) one hour before dialysis. Cover with occlusive dressing (saran wrap) 11/14/17   [provider]  metoprolol succinate (TOPROL-XL) 50 MG 24  hr tablet Take 50 mg by mouth daily. 02/16/20   [provider]  oxyCODONE (OXY IR/ROXICODONE) 5 MG immediate release tablet Take 5 mg by mouth 2 (two) times daily as needed. 04/29/20   [provider]  oxycodone (ROXICODONE) 30 MG immediate release tablet Take 30 mg by mouth 3 (three) times daily.  08/26/18   [provider]     Anti-infectives (From admission, onward)   None      Results for orders placed or performed during the hospital encounter of 05/04/20 (from the past 48 hour(s))  Basic metabolic panel     Status: Abnormal   Collection Time: 05/04/20  8:34 PM  Result Value Ref Range   Sodium 134 (L) 135 - 145 mmol/L   Potassium 4.6 3.5 - 5.1 mmol/L   Chloride 96 (L) 98 - 111 mmol/L   CO2 24 22 - 32 mmol/L   Glucose, Bld 84 70 - 99 mg/dL    Comment: Glucose reference range applies only to samples taken after fasting for at least 8 hours.   BUN 54 (H) 8 - 23 mg/dL   Creatinine, Ser 11.52 (H) 0.61 - 1.24 mg/dL    Calcium 9.4 8.9 - 10.3 mg/dL   GFR, Estimated 4 (L) >60 mL/min    Comment: (NOTE) Calculated using the CKD-EPI Creatinine Equation (2021)    Anion gap 14 5 - 15    Comment: Performed at [facility] [address], [place], [place] [address]  CBC     Status: Abnormal   Collection Time: 05/04/20  8:34 PM  Result Value Ref Range   WBC 4.5 4.0 - 10.5 K/uL   RBC 3.93 (L) 4.22 - 5.81 MIL/uL   Hemoglobin 11.5 (L) 13.0 - 17.0 g/dL   HCT 37.7 (L) 39.0 - 52.0 %   MCV 95.9 80.0 - 100.0 fL   MCH 29.3 26.0 - 34.0 pg   MCHC 30.5 30.0 - 36.0 g/dL   RDW 15.7 (H) 11.5 - 15.5 %   Platelets 109 (L) 150 - 400 K/uL    Comment: REPEATED TO VERIFY PLATELET COUNT CONFIRMED BY SMEAR SPECIMEN CHECKED FOR CLOTS Immature Platelet Fraction may be clinically indicated, consider ordering this additional test [id number]    nRBC 0.0 0.0 - 0.2 %    Comment: Performed at [facility], [place] [address], [place], [place] [address]  Troponin I (High Sensitivity)     Status: Abnormal   Collection Time: 05/04/20  8:34 PM  Result Value Ref Range   Troponin I (High Sensitivity) 125 (HH) <18 ng/L    Comment: CRITICAL RESULT CALLED TO, READ BACK BY AND VERIFIED WITH: CHRIS CHRISCO RN [phone] M GARRETT (NOTE) Elevated high sensitivity troponin I (hsTnI) values and significant  changes across serial measurements may suggest ACS but many other  chronic and acute conditions are known to elevate hsTnI results.  Refer to the Links section for chest pain algorithms and additional  guidance. Performed at [facility], [place] [address] [place], [place] [address]   Resp Panel by RT-PCR (Flu A&B, Covid) Nasopharyngeal Swab     Status: None   Collection Time: 05/05/20 12:50 AM   Specimen: Nasopharyngeal Swab; Nasopharyngeal(NP) swabs in vial transport medium  Result Value Ref Range   SARS Coronavirus 2 by RT PCR NEGATIVE NEGATIVE    Comment: (NOTE) SARS-CoV-2 target nucleic acids are NOT  DETECTED.  The SARS-CoV-2 RNA is generally detectable in upper respiratory specimens during the acute  phase of infection. The lowest concentration of SARS-CoV-2 viral copies this assay can detect is 138 copies/mL. A negative result does not preclude SARS-Cov-2 infection and should not be used as the sole basis for treatment or other patient management decisions. A negative result may occur with  improper specimen collection/handling, submission of specimen other than nasopharyngeal swab, presence of viral mutation(s) within the areas targeted by this assay, and inadequate number of viral copies(<138 copies/mL). A negative result must be combined with clinical observations, patient history, and epidemiological information. The expected result is Negative.  Fact Sheet for Patients:  EntrepreneurPulse.com.au  Fact Sheet for Healthcare Providers:  IncredibleEmployment.be  This test is no t yet approved or cleared by the [place] FDA and  has been authorized for detection and/or diagnosis of SARS-CoV-2 by FDA under an Emergency Use Authorization (EUA). This EUA will remain  in effect (meaning this test can be used) for the duration of the COVID-19 declaration under Section 564(b)(1) of the Act, 21 U.S.C.section 360bbb-3(b)(1), unless the authorization is terminated  or revoked sooner.       Influenza A by PCR NEGATIVE NEGATIVE   Influenza B by PCR NEGATIVE NEGATIVE    Comment: (NOTE) The Xpert Xpress SARS-CoV-2/FLU/RSV plus assay is intended as an aid in the diagnosis of influenza from Nasopharyngeal swab specimens and should not be used as a sole basis for treatment. Nasal washings and aspirates are unacceptable for Xpert Xpress SARS-CoV-2/FLU/RSV testing.  Fact Sheet for Patients: EntrepreneurPulse.com.au  Fact Sheet for Healthcare Providers: IncredibleEmployment.be  This test is not yet approved or  cleared by the [place] FDA and has been authorized for detection and/or diagnosis of SARS-CoV-2 by FDA under an Emergency Use Authorization (EUA). This EUA will remain in effect (meaning this test can be used) for the duration of the COVID-19 declaration under Section 564(b)(1) of the Act, 21 U.S.C. section 360bbb-3(b)(1), unless the authorization is terminated or revoked.  Performed at [facility], [place] [address], [place], [place] [address]   I-Stat arterial blood gas, ED     Status: Abnormal   Collection Time: 05/05/20 12:53 AM  Result Value Ref Range   pH, Arterial 7.486 (H) 7.350 - 7.450   pCO2 arterial 37.2 32.0 - 48.0 mmHg   pO2, Arterial 82 (L) 83.0 - 108.0 mmHg   Bicarbonate 28.3 (H) 20.0 - 28.0 mmol/L   TCO2 30 22 - 32 mmol/L   O2 Saturation 97.0 %   Acid-Base Excess 4.0 (H) 0.0 - 2.0 mmol/L   Sodium 136 135 - 145 mmol/L   Potassium 4.7 3.5 - 5.1 mmol/L   Calcium, Ion 1.14 (L) 1.15 - 1.40 mmol/L   HCT 35.0 (L) 39.0 - 52.0 %   Hemoglobin 11.9 (L) 13.0 - 17.0 g/dL   Patient temperature 96.7 F    Collection site Radial    Drawn by RT    Sample type ARTERIAL   Troponin I (High Sensitivity)     Status: Abnormal   Collection Time: 05/05/20  1:59 AM  Result Value Ref Range   Troponin I (High Sensitivity) 139 (HH) <18 ng/L    Comment: CRITICAL VALUE NOTED.  VALUE IS CONSISTENT WITH PREVIOUSLY REPORTED AND CALLED VALUE. (NOTE) Elevated high sensitivity troponin I (hsTnI) values and significant  changes across serial measurements may suggest ACS but many other  chronic and acute conditions are known to elevate hsTnI results.  Refer to the Links section for chest pain algorithms and additional  guidance. Performed at [facility]  [facility], [place] [address] [place], [place] [address]   Basic metabolic panel     Status: Abnormal   Collection Time: 05/05/20  4:41 AM  Result Value Ref Range   Sodium 139 135 - 145 mmol/L   Potassium 4.8 3.5 - 5.1 mmol/L   Chloride  97 (L) 98 - 111 mmol/L   CO2 26 22 - 32 mmol/L   Glucose, Bld 75 70 - 99 mg/dL    Comment: Glucose reference range applies only to samples taken after fasting for at least 8 hours.   BUN 51 (H) 8 - 23 mg/dL   Creatinine, Ser 12.16 (H) 0.61 - 1.24 mg/dL   Calcium 9.5 8.9 - 10.3 mg/dL   GFR, Estimated 4 (L) >60 mL/min    Comment: (NOTE) Calculated using the CKD-EPI Creatinine Equation (2021)    Anion gap 16 (H) 5 - 15    Comment: Performed at [facility] [address], [place], [place] [address]  CBC     Status: Abnormal   Collection Time: 05/05/20  4:41 AM  Result Value Ref Range   WBC 4.1 4.0 - 10.5 K/uL   RBC 3.84 (L) 4.22 - 5.81 MIL/uL   Hemoglobin 11.6 (L) 13.0 - 17.0 g/dL   HCT 36.4 (L) 39.0 - 52.0 %   MCV 94.8 80.0 - 100.0 fL   MCH 30.2 26.0 - 34.0 pg   MCHC 31.9 30.0 - 36.0 g/dL   RDW 15.9 (H) 11.5 - 15.5 %   Platelets 84 (L) 150 - 400 K/uL    Comment: REPEATED TO VERIFY SPECIMEN CHECKED FOR CLOTS Immature Platelet Fraction may be clinically indicated, consider ordering this additional test [id number] CONSISTENT WITH PREVIOUS RESULT    nRBC 0.0 0.0 - 0.2 %    Comment: Performed at [facility], [place] [address] [place], [place] [address]  Troponin I (High Sensitivity)     Status: Abnormal   Collection Time: 05/05/20  4:41 AM  Result Value Ref Range   Troponin I (High Sensitivity) 135 (HH) <18 ng/L    Comment: CRITICAL VALUE NOTED.  VALUE IS CONSISTENT WITH PREVIOUSLY REPORTED AND CALLED VALUE. (NOTE) Elevated high sensitivity troponin I (hsTnI) values and significant  changes across serial measurements may suggest ACS but many other  chronic and acute conditions are known to elevate hsTnI results.  Refer to the Links section for chest pain algorithms and additional  guidance. Performed at [facility], [place] [address], [place], [place] [address]   Troponin I (High Sensitivity)     Status: Abnormal   Collection Time: 05/05/20  6:51 AM  Result  Value Ref Range   Troponin I (High Sensitivity) 149 (HH) <18 ng/L    Comment: CRITICAL VALUE NOTED.  VALUE IS CONSISTENT WITH PREVIOUSLY REPORTED AND CALLED VALUE. (NOTE) Elevated high sensitivity troponin I (hsTnI) values and significant  changes across serial measurements may suggest ACS but many other  chronic and acute conditions are known to elevate hsTnI results.  Refer to the Links section for chest pain algorithms and additional  guidance. Performed at [facility], [place] [address] [place], [place] [address]      ROS: See HPI  Physical Exam: Vitals:   05/05/20 1030 05/05/20 1100  BP: (!) 199/88 (!) 190/90  Pulse: (!) 102 (!) 105  Resp: (!) 21 (!) 25  Temp:    SpO2: 98% 98%     General: Alert thin chronically ill-appearing male in no  acute distress, currently on nasal cannula oxygen off BiPAP HEENT: [place], EOMI, PERRLA, nonicteric  Neck: Supple no JVD appreciated Heart: RRR no murmur rub or gallop appreciated Lungs: Faint Rales, nonlabored breathing currently on nasal cannula oxygen Abdomen: Bowel sounds normoactive, nontender, nondistended no ascites Extremities: No pedal edema appreciated. Skin: Warm and dry no acute rash or wounds appreciated Neuro: Alert oriented x3 no acute focal deficits appreciated no asterixis Dialysis Access: AV fistula patent on hemodialysis  Dialysis Orders: Center: [facility]  TTS EDW 48.5  , K 2. Ca 2  HD  Time 4hr Heparin None . Access  RUA AVF Hec 2 mcg IV No ESA    Assessment/Plan 1. Dyspnea/volume overload-respiratory distress required BiPAP in the ER, elevated troponin with chest discomfort probably demand ischemia with missing hemodialysis/ hd tomor   2. ESRD -HD schedule TTS, hemotoday and again tomorrow second to missed dialysis/ no HD since 11/30 /21  Hd tomorrow  3. Hypertension/volume  -hypotension secondary to volume overload, tolerating UF on dialysis today plan dialysis tomorrow to 4. Anemia  -Hgb over 10 does  not need ESA 5. Metabolic bone disease -   Hec  On hd , binders with meals  6. Nutrition - renal , renal vit  7. Ho CAD  With stenting - + CE, dmit wu. Possible  In . ischemic demand  Vol ^  In ESRD   Ernest Haber, PA-C [facility] [phone] 05/05/2020, 11:44 AM    Nephrology attending: Seen and examined in HD unit, chart reviewed. I agree with above.  Nonadherence with outpatient dialysis treatment admitted with pulmonary edema, respiratory distress.  On BiPAP.  Ultrafiltration during dialysis today and may need another session tomorrow if he is not able to wean to room air today. Plan for echo by primary team.  Katheran James, MD [facility].

## 2020-05-05 NOTE — Progress Notes (Signed)
Patient placed back on Bipap due to increased RR and HR.

## 2020-05-06 ENCOUNTER — Encounter (HOSPITAL_COMMUNITY)
Admission: EM | Disposition: A | Discharge status: Home / Self Care | Payer: Self-pay | Source: Home / Self Care | Attending: Internal Medicine

## 2020-05-06 DIAGNOSIS — I214 Non-ST elevation (NSTEMI) myocardial infarction: Secondary | ICD-10-CM

## 2020-05-06 DIAGNOSIS — J9601 Acute respiratory failure with hypoxia: Secondary | ICD-10-CM

## 2020-05-06 DIAGNOSIS — D631 Anemia in chronic kidney disease: Secondary | ICD-10-CM

## 2020-05-06 HISTORY — PX: LEFT HEART CATH AND CORONARY ANGIOGRAPHY: CATH118249

## 2020-05-06 LAB — BASIC METABOLIC PANEL
Anion gap: 17 — ABNORMAL HIGH (ref 5–15)
BUN: 30 mg/dL — ABNORMAL HIGH (ref 8–23)
CO2: 26 mmol/L (ref 22–32)
Calcium: 9.4 mg/dL (ref 8.9–10.3)
Chloride: 90 mmol/L — ABNORMAL LOW (ref 98–111)
Creatinine, Ser: 8.19 mg/dL — ABNORMAL HIGH (ref 0.61–1.24)
GFR, Estimated: 7 mL/min — ABNORMAL LOW (ref >60)
Glucose, Bld: 96 mg/dL (ref 70–99)
Potassium: 4.3 mmol/L (ref 3.5–5.1)
Sodium: 133 mmol/L — ABNORMAL LOW (ref 135–145)

## 2020-05-06 LAB — CBC
HCT: 38.8 % — ABNORMAL LOW (ref 39.0–52.0)
Hemoglobin: 12.2 g/dL — ABNORMAL LOW (ref 13.0–17.0)
MCH: 29.5 pg (ref 26.0–34.0)
MCHC: 31.4 g/dL (ref 30.0–36.0)
MCV: 93.9 fL (ref 80.0–100.0)
Platelets: 88 10*3/uL — ABNORMAL LOW (ref 150–400)
RBC: 4.13 MIL/uL — ABNORMAL LOW (ref 4.22–5.81)
RDW: 15.8 % — ABNORMAL HIGH (ref 11.5–15.5)
WBC: 4.3 10*3/uL (ref 4.0–10.5)
nRBC: 0 % (ref 0.0–0.2)

## 2020-05-06 LAB — MAGNESIUM: Magnesium: 2 mg/dL (ref 1.7–2.4)

## 2020-05-06 LAB — POTASSIUM: Potassium: 4.2 mmol/L (ref 3.5–5.1)

## 2020-05-06 LAB — TROPONIN I (HIGH SENSITIVITY): Troponin I (High Sensitivity): 150 ng/L (ref <18)

## 2020-05-06 SURGERY — LEFT HEART CATH AND CORONARY ANGIOGRAPHY
Anesthesia: LOCAL

## 2020-05-06 MED ORDER — IOHEXOL 350 MG/ML SOLN
INTRAVENOUS | Status: DC | PRN
  Administered 2020-05-06: 60 mL

## 2020-05-06 MED ORDER — NITROGLYCERIN IN D5W 200-5 MCG/ML-% IV SOLN
INTRAVENOUS | Status: AC | PRN
  Administered 2020-05-06: 40 ug/min via INTRAVENOUS

## 2020-05-06 MED ORDER — MELATONIN 5 MG PO TABS
5.0000 mg | ORAL_TABLET | Freq: Every evening | ORAL | Status: DC | PRN
  Administered 2020-05-06 (×2): 5 mg via ORAL
  Filled 2020-05-06 (×2): qty 1

## 2020-05-06 MED ORDER — SODIUM CHLORIDE 0.9% FLUSH: 3.0000 mL | INTRAVENOUS | Status: DC | PRN

## 2020-05-06 MED ORDER — SODIUM CHLORIDE 0.9 % IV SOLN: INTRAVENOUS | Status: DC

## 2020-05-06 MED ORDER — SODIUM CHLORIDE 0.9% FLUSH
3.0000 mL | INTRAVENOUS | Status: DC | PRN
Start: 2020-05-06 — End: 2020-05-07

## 2020-05-06 MED ORDER — SODIUM CHLORIDE 0.9 % IV SOLN: 250.0000 mL | INTRAVENOUS | Status: DC | PRN

## 2020-05-06 MED ORDER — MIDAZOLAM HCL 2 MG/2ML IJ SOLN
INTRAMUSCULAR | Status: AC
  Filled 2020-05-06: qty 2

## 2020-05-06 MED ORDER — ACETAMINOPHEN 325 MG PO TABS: 650.0000 mg | ORAL_TABLET | ORAL | Status: DC | PRN

## 2020-05-06 MED ORDER — FENTANYL CITRATE (PF) 100 MCG/2ML IJ SOLN
INTRAMUSCULAR | Status: AC
  Filled 2020-05-06: qty 2

## 2020-05-06 MED ORDER — LIDOCAINE HCL (PF) 1 % IJ SOLN
INTRAMUSCULAR | Status: AC
  Filled 2020-05-06: qty 30

## 2020-05-06 MED ORDER — ACETAMINOPHEN 650 MG RE SUPP: 650.0000 mg | Freq: Four times a day (QID) | RECTAL | Status: DC | PRN

## 2020-05-06 MED ORDER — SODIUM CHLORIDE 0.9% FLUSH
3.0000 mL | Freq: Two times a day (BID) | INTRAVENOUS | Status: DC
  Administered 2020-05-06: 3 mL via INTRAVENOUS

## 2020-05-06 MED ORDER — NITROGLYCERIN IN D5W 200-5 MCG/ML-% IV SOLN
INTRAVENOUS | Status: AC
  Filled 2020-05-06: qty 250

## 2020-05-06 MED ORDER — SODIUM CHLORIDE 0.9% FLUSH
3.0000 mL | Freq: Two times a day (BID) | INTRAVENOUS | Status: DC
  Administered 2020-05-06 (×2): 3 mL via INTRAVENOUS

## 2020-05-06 MED ORDER — ACETAMINOPHEN 325 MG PO TABS: 650.0000 mg | ORAL_TABLET | Freq: Four times a day (QID) | ORAL | Status: DC | PRN

## 2020-05-06 MED ORDER — FENTANYL CITRATE (PF) 100 MCG/2ML IJ SOLN
INTRAMUSCULAR | Status: DC | PRN
  Administered 2020-05-06: 50 ug via INTRAVENOUS

## 2020-05-06 MED ORDER — LABETALOL HCL 5 MG/ML IV SOLN: 10.0000 mg | INTRAVENOUS | Status: AC | PRN

## 2020-05-06 MED ORDER — MIDAZOLAM HCL 2 MG/2ML IJ SOLN
INTRAMUSCULAR | Status: DC | PRN
  Administered 2020-05-06: 1 mg via INTRAVENOUS

## 2020-05-06 MED ORDER — HYDRALAZINE HCL 20 MG/ML IJ SOLN: 10.0000 mg | INTRAMUSCULAR | Status: AC | PRN

## 2020-05-06 MED ORDER — LIDOCAINE HCL (PF) 1 % IJ SOLN
INTRAMUSCULAR | Status: DC | PRN
  Administered 2020-05-06: 10 mL

## 2020-05-06 SURGICAL SUPPLY — 16 items
CATH INFINITI 5FR AL1 (CATHETERS) ×1 IMPLANT
CATH INFINITI 5FR MULTPACK ANG (CATHETERS) ×1 IMPLANT
CATH LAUNCHER 5F 3DRIGHT (CATHETERS) IMPLANT
CATHETER LAUNCHER 5F 3DRIGHT (CATHETERS) ×2
CLOSURE PERCLOSE PROSTYLE (VASCULAR PRODUCTS) ×1 IMPLANT
KIT HEART LEFT (KITS) ×2 IMPLANT
KIT MICROPUNCTURE NIT STIFF (SHEATH) ×1 IMPLANT
PACK CARDIAC CATHETERIZATION (CUSTOM PROCEDURE TRAY) ×2 IMPLANT
SHEATH PINNACLE 5F 10CM (SHEATH) ×1 IMPLANT
SHEATH PROBE COVER 6X72 (BAG) ×1 IMPLANT
SYR MEDRAD MARK 7 150ML (SYRINGE) ×2 IMPLANT
TRANSDUCER W/STOPCOCK (MISCELLANEOUS) ×2 IMPLANT
TUBING CIL FLEX 10 FLL-RA (TUBING) ×2 IMPLANT
TUBING CONTRAST HIGH PRESS 48 (TUBING) ×1 IMPLANT
WIRE EMERALD 3MM-J .035X150CM (WIRE) ×1 IMPLANT
WIRE HI TORQ VERSACORE J 260CM (WIRE) ×3 IMPLANT

## 2020-05-06 NOTE — Progress Notes (Signed)
PROGRESS NOTE   Logan Martin  RSW:[id number]    DOB: May 16, 1956    DOA: 05/04/2020  PCP: [name], NP   I have briefly reviewed patients previous medical records in [facility].  Chief Complaint  Patient presents with  . Shortness of Breath    Brief Narrative:  64 year old male, lives with his grandson, ambulates with the help of a walker, past medical history significant for ESRD on TTS HD, HTN, HLD, CAD s/p PCI, cardiomyopathy, anemia, thrombocytopenia, PE, hepatitis C, prior polysubstance abuse (alcohol, cocaine), PAD, hard of hearing, radical cystectomy with bilateral lymphadenectomy and radical prostatectomy in 2020 at [facility] for adenocarcinoma of the bladder presented to the ED with worsening dyspnea, some substernal chest discomfort and pressure, nonproductive cough, after missing hemodialysis on 05/03/2020.  In the ED noted to be tachycardic and in respiratory distress and placed on BiPAP.  Admitted for acute respiratory distress due to acute pulmonary edema/acute on chronic systolic CHF likely due to missed HD and for chest pain evaluation.  Nephrology and cardiology were consulted.  S/p HD 12/9.  S/p cardiac cath 12/10.  Remains volume overloaded, next HD planned for 12/11.   Assessment & Plan:  Principal Problem:   ESRD on dialysis [facility]) Active Problems:   CAD (coronary atherosclerotic disease)   Chest discomfort   Thrombocytopenia (HCC)   Anemia in ESRD (end-stage renal disease) (HCC)   Elevated troponin   Prolonged QT interval   Non-ST elevation (NSTEMI) myocardial infarction ([place])   Acute respiratory distress due to acute pulmonary edema/acute on chronic diastolic CHF, likely precipitated by missed hemodialysis:  Patient with acute respiratory distress on admission.  ABG: pH 7.486, PCO2 37.2, PO2 82, HCO3 28.3 and oxygen saturation 97%.  Did not see true hypoxia documented  Chest x-ray personally reviewed and consistent with pulmonary edema.  Overnight  in the ED placed on BiPAP continuously with improvement.  Nephrology consulted and patient underwent HD 12/9.  Weaned off BiPAP and currently on nasal cannula oxygen 2 L/min and saturating in the low 90s.  Symptomatic of orthopnea.  Next HD planned for 12/11.  CAD s/p PCI, cardiomyopathy, chest pain, elevated troponin possibly from demand ischemia:  Echo stress test 08/01/2018 at [facility] showed LVEF 30%, indeterminate with abnormal resting study and no change with stress.  Trivial AR, severe MR, trivial PR, mild TR.  No valvular stenosis.  Follows with outpatient Cardiology/Dr. Woody Seller in [place].  HS troponins mildly elevated with flat trend, 125 > 139 > 135 > 149, not consistent with ACS.  Cardiology consulted.  I discussed in detail with Dr. Terri Skains on 12/9 who advised that his presentation is concerning for ischemia and recommended cardiac cath  Cardiac cath 12/10 shows nonobstructive CAD.  Presentation likely due to hypertensive urgency.  2D echo 12/9: LVEF has improved to 50%, moderately severe MR, moderate TR, no aortic stenosis.  Continue aspirin, statins and Toprol-XL.  Aortic root dilatation  2D echo 12/9 shows aortic root dilatation of 40 mm.  Outpatient follow-up.  ESRD on TTS HD  Nephrology consulted and supposed to have hemodialysis today and again tomorrow secondary to missed hemodialysis.  As per nephrology follow-up, net UF 3.6 L with HD yesterday, may be EDW needs to be lowered.  Next HD planned for 12/11.  Essential hypertension  Uncontrolled BP likely precipitated by volume overload and acute respiratory distress.  Volume management across hemodialysis.  Continue prior home antihypertensives including amlodipine and Toprol-XL.  Hydralazine added by cardiology.  Better controlled.  Hyperlipidemia:  Continue statins.  Anemia in ESRD  Hemoglobin up to 12 today.  No EPA indicated.  Thrombocytopenia  Appears chronic and fluctuating.    Follow CBC  periodically.  No bleeding reported.  Prolonged QTC  QTC by EKG 12/8: 564 ms.  However not sure if this is accurate given IVCD and BBB morphology.  Minimize QT prolonging medications.   Body mass index is 17.79 kg/m.   DVT prophylaxis: SCD's Start: 05/06/20 1431 SCDs Start: 05/05/20 0420     Code Status: Full Code Family Communication: None at bedside. Disposition:  Status is: Inpatient  Remains inpatient appropriate because:Inpatient level of care appropriate due to severity of illness   Dispo: The patient is from: Home              Anticipated d/c is to: Home              Anticipated d/c date is: 2 days              Patient currently is not medically stable to d/c.        Consultants:   Nephrology Cardiology  Procedures:   Hemodialysis Cardiac cath 05/06/2020:  Impression: Non-obstructive coronary artery disease Patent renal arteries Presentation likely due to hypertensive urgency   Antimicrobials:    Anti-infectives (From admission, onward)   None        Subjective:  Patient seen this morning prior to procedure.  Overall feels better than on admission but still reports dyspnea and precordial chest discomfort on prolonged laying down in bed.  Objective:   Vitals:   05/06/20 1406 05/06/20 1441 05/06/20 1510 05/06/20 1622  BP: (!) 155/89 (!) 166/69 (!) 162/90 (!) 152/91  Pulse: 99 80    Resp: 14 14    Temp:  98.2 F (36.8 C)    TempSrc:  Oral    SpO2: 94% 94%    Weight:      Height:        General exam: Middle-age male, moderately built and thinly nourished lying comfortably propped up in bed.  Looks much improved compared to yesterday. Respiratory system: Occasional basal crackles but otherwise clear to auscultation.  No increased work of breathing. Cardiovascular system: S1 & S2 heard, RRR.  JVD +.  Pansystolic murmur best heard at apex.  No gallops or pedal edema.  Telemetry personally reviewed: Sinus rhythm.  Brief nonsustained SVT at 9:05  PM on 12/9. Gastrointestinal system: Abdomen is nondistended, soft and nontender. No organomegaly or masses felt. Normal bowel sounds heard.  Midline laparotomy scar, healed with secondary intention Central nervous system: Alert and oriented. No focal neurological deficits. Extremities: Symmetric 5 x 5 power.  Right upper arm AV fistula with thrill. Skin: No rashes, lesions or ulcers Psychiatry: Judgement and insight appear normal. Mood & affect appropriate.     Data Reviewed:   I have personally reviewed following labs and imaging studies   CBC: Recent Labs  Lab 05/04/20 2034 05/05/20 0053 05/05/20 0441 05/06/20 0545  WBC 4.5  --  4.1 4.3  HGB 11.5* 11.9* 11.6* 12.2*  HCT 37.7* 35.0* 36.4* 38.8*  MCV 95.9  --  94.8 93.9  PLT 109*  --  84* 88*    Basic Metabolic Panel: Recent Labs  Lab 05/04/20 2034 05/05/20 0053 05/05/20 0441 05/06/20 0157 05/06/20 0545  NA 134* 136 139  --  133*  K 4.6 4.7 4.8 4.2 4.3  CL 96*  --  97*  --  90*  CO2 24  --  26  --  26  GLUCOSE 84  --  75  --  96  BUN 54*  --  51*  --  30*  CREATININE 11.52*  --  12.16*  --  8.19*  CALCIUM 9.4  --  9.5  --  9.4  MG  --   --   --  2.0  --     Liver Function Tests: No results for input(s): AST, ALT, ALKPHOS, BILITOT, PROT, ALBUMIN in the last 168 hours.  CBG: No results for input(s): GLUCAP in the last 168 hours.  Microbiology Studies:   Recent Results (from the past 240 hour(s))  Resp Panel by RT-PCR (Flu A&B, Covid) Nasopharyngeal Swab     Status: None   Collection Time: 05/05/20 12:50 AM   Specimen: Nasopharyngeal Swab; Nasopharyngeal(NP) swabs in vial transport medium  Result Value Ref Range Status   SARS Coronavirus 2 by RT PCR NEGATIVE NEGATIVE Final    Comment: (NOTE) SARS-CoV-2 target nucleic acids are NOT DETECTED.  The SARS-CoV-2 RNA is generally detectable in upper respiratory specimens during the acute phase of infection. The lowest concentration of SARS-CoV-2 viral copies  this assay can detect is 138 copies/mL. A negative result does not preclude SARS-Cov-2 infection and should not be used as the sole basis for treatment or other patient management decisions. A negative result may occur with  improper specimen collection/handling, submission of specimen other than nasopharyngeal swab, presence of viral mutation(s) within the areas targeted by this assay, and inadequate number of viral copies(<138 copies/mL). A negative result must be combined with clinical observations, patient history, and epidemiological information. The expected result is Negative.  Fact Sheet for Patients:  EntrepreneurPulse.com.au  Fact Sheet for Healthcare Providers:  IncredibleEmployment.be  This test is no t yet approved or cleared by the [place] FDA and  has been authorized for detection and/or diagnosis of SARS-CoV-2 by FDA under an Emergency Use Authorization (EUA). This EUA will remain  in effect (meaning this test can be used) for the duration of the COVID-19 declaration under Section 564(b)(1) of the Act, 21 U.S.C.section 360bbb-3(b)(1), unless the authorization is terminated  or revoked sooner.       Influenza A by PCR NEGATIVE NEGATIVE Final   Influenza B by PCR NEGATIVE NEGATIVE Final    Comment: (NOTE) The Xpert Xpress SARS-CoV-2/FLU/RSV plus assay is intended as an aid in the diagnosis of influenza from Nasopharyngeal swab specimens and should not be used as a sole basis for treatment. Nasal washings and aspirates are unacceptable for Xpert Xpress SARS-CoV-2/FLU/RSV testing.  Fact Sheet for Patients: EntrepreneurPulse.com.au  Fact Sheet for Healthcare Providers: IncredibleEmployment.be  This test is not yet approved or cleared by the [place] FDA and has been authorized for detection and/or diagnosis of SARS-CoV-2 by FDA under an Emergency Use Authorization (EUA). This EUA  will remain in effect (meaning this test can be used) for the duration of the COVID-19 declaration under Section 564(b)(1) of the Act, 21 U.S.C. section 360bbb-3(b)(1), unless the authorization is terminated or revoked.  Performed at [facility], [place] [address], [place], [place] [address]   Surgical pcr screen     Status: None   Collection Time: 05/05/20  7:41 PM   Specimen: Nasal Mucosa; Nasal Swab  Result Value Ref Range Status   MRSA, PCR NEGATIVE NEGATIVE Final   Staphylococcus aureus NEGATIVE NEGATIVE Final    Comment: (NOTE) The Xpert SA Assay (FDA approved for NASAL specimens in patients 34 years of age  and older), is one component of a comprehensive surveillance program. It is not intended to diagnose infection nor to guide or monitor treatment. Performed at [facility], [place] [address], [place], [place] [address]      Radiology Studies:  DG Chest 2 View  Result Date: 05/04/2020 CLINICAL DATA:  Shortness of breath EXAM: CHEST - 2 VIEW COMPARISON:  03/15/2020 FINDINGS: Right subclavian/innominate stent unchanged. Small bilateral effusions. Cardiomegaly with vascular congestion and mild interstitial opacities suspicious for pulmonary edema. More confluent airspace disease at both lung bases. No pneumothorax. IMPRESSION: Cardiomegaly with vascular congestion, mild interstitial opacities suspicious for pulmonary edema. Small bilateral effusions. More confluent airspace disease at the bases is suspicious for superimposed pneumonia. Electronically Signed   By: Donavan Foil M.D.   On: 05/04/2020 20:57   CARDIAC CATHETERIZATION  Result Date: 05/06/2020 LM: Normal LAD: Distal LAD myocardial bridge LCx: Normal Ramus: Normal RCA: Prox RCA patent stent. No ISR         Distal RCA 40% stenosis Bilateral renal arteries: Patent with no stenosis LVEDP 29 mmHg Impression: Non-obstructive coronary artery disease Patent renal arteries Presentation likely due to hypertensive  urgency  ECHOCARDIOGRAM COMPLETE  Result Date: 05/05/2020    ECHOCARDIOGRAM REPORT   Patient Name:   Logan Martin Date of Exam: 05/05/2020 Medical Rec #:  [id number]      Height:       64.0 in Accession #:    [id number]     Weight:       110.2 lb Date of Birth:  May 18, 1956      BSA:          1.519 m Patient Age:    89 years       BP:           200/87 mmHg Patient Gender: M              HR:           95 bpm. Exam Location:  Inpatient Procedure: 2D Echo, Color Doppler and Cardiac Doppler Indications:    CHF-Acute Systolic 884.16 / S06.30  History:        Patient has prior history of Echocardiogram examinations, most                 recent 11/27/2012. COPD; Risk Factors:Hypertension, Diabetes and                 ETOH.  Sonographer:    Bernadene Person RDCS Referring Phys: [place]  1. Compared to echo report from 2014, LV function and MR are not significantly changed.  2. Left ventricular ejection fraction, by estimation, is 50%. The left ventricle has mildly decreased function. The left ventricle has no regional wall motion abnormalities. The left ventricular internal cavity size was severely dilated. There is mild left ventricular hypertrophy. Indeterminate diastolic filling due to E-A fusion.  3. Right ventricular systolic function is low normal. The right ventricular size is normal. There is mildly elevated pulmonary artery systolic pressure.  4. Left atrial size was severely dilated.  5. A small pericardial effusion is present.  6. MR is eccentric, directed posterior into LA At least moderately severe. Consider TEE to further define. . The mitral valve is abnormal. Moderate to severe mitral valve regurgitation.  7. Tricuspid valve regurgitation is moderate.  8. The aortic valve is tricuspid. Aortic valve regurgitation is not visualized. Mild aortic valve sclerosis is present, with no evidence of aortic valve stenosis.  9. Aortic dilatation noted. There is mild dilatation of the aortic  root, measuring 40 mm. 10. The inferior vena cava is normal in size with greater than 50% respiratory variability, suggesting right atrial pressure of 3 mmHg. FINDINGS  Left Ventricle: Left ventricular ejection fraction, by estimation, is 50%. The left ventricle has mildly decreased function. The left ventricle has no regional wall motion abnormalities. The left ventricular internal cavity size was severely dilated. There is mild left ventricular hypertrophy. Indeterminate diastolic filling due to E-A fusion. Right Ventricle: The right ventricular size is normal. Right vetricular wall thickness was not assessed. Right ventricular systolic function is low normal. There is mildly elevated pulmonary artery systolic pressure. The tricuspid regurgitant velocity is  2.92 m/s, and with an assumed right atrial pressure of 3 mmHg, the estimated right ventricular systolic pressure is 40.9 mmHg. Left Atrium: Left atrial size was severely dilated. Right Atrium: Right atrial size was normal in size. Pericardium: A small pericardial effusion is present. Mitral Valve: MR is eccentric, directed posterior into LA At least moderately severe. Consider TEE to further define. The mitral valve is abnormal. There is mild thickening of the mitral valve leaflet(s). Mild mitral annular calcification. Moderate to severe mitral valve regurgitation. Tricuspid Valve: The tricuspid valve is normal in structure. Tricuspid valve regurgitation is moderate. Aortic Valve: The aortic valve is tricuspid. Aortic valve regurgitation is not visualized. Mild aortic valve sclerosis is present, with no evidence of aortic valve stenosis. Pulmonic Valve: The pulmonic valve was grossly normal. Pulmonic valve regurgitation is mild. Aorta: Aortic dilatation noted. There is mild dilatation of the aortic root, measuring 40 mm. Venous: The inferior vena cava is normal in size with greater than 50% respiratory variability, suggesting right atrial pressure of 3 mmHg.  IAS/Shunts: No atrial level shunt detected by color flow Doppler.  LEFT VENTRICLE PLAX 2D LVIDd:         5.90 cm LVIDs:         4.30 cm LV PW:         1.20 cm LV IVS:        1.00 cm LVOT diam:     2.10 cm LV SV:         67 LV SV Index:   44 LVOT Area:     3.46 cm  RIGHT VENTRICLE TAPSE (M-mode): 1.4 cm LEFT ATRIUM              Index       RIGHT ATRIUM           Index LA diam:        4.70 cm  3.09 cm/m  RA Area:     10.40 cm LA Vol (A2C):   102.0 ml 67.16 ml/m RA Volume:   19.80 ml  13.04 ml/m LA Vol (A4C):   104.0 ml 68.48 ml/m LA Biplane Vol: 103.0 ml 67.82 ml/m  AORTIC VALVE LVOT Vmax:   130.00 cm/s LVOT Vmean:  81.700 cm/s LVOT VTI:    0.194 m  AORTA Ao Root diam: 3.40 cm Ao Asc diam:  3.00 cm MR Peak grad:    194.9 mmHg  TRICUSPID VALVE MR Mean grad:    121.0 mmHg  TR Peak grad:   34.1 mmHg MR Vmax:         698.00 cm/s TR Vmax:        292.00 cm/s MR Vmean:        511.0 cm/s MR PISA:         1.01  cm    SHUNTS MR PISA Eff ROA: 5 mm       Systemic VTI:  0.19 m MR PISA Radius:  0.40 cm     Systemic Diam: 2.10 cm Dorris Carnes MD Electronically signed by Dorris Carnes MD Signature Date/Time: 05/05/2020/7:54:09 PM    Final      Scheduled Meds:   . amLODipine  10 mg Oral Daily  . aspirin EC  81 mg Oral QHS  . atorvastatin  40 mg Oral Daily  . calcitRIOL  1.5 mcg Oral Daily  . Chlorhexidine Gluconate Cloth  6 each Topical Q0600  . ferric citrate  420 mg Oral TID WC  . hydrALAZINE  25 mg Oral TID  . metoprolol succinate  50 mg Oral Daily  . sodium chloride flush  3 mL Intravenous Q12H    Continuous Infusions:   . sodium chloride       LOS: 1 day     Vernell Leep, MD, [place], [facility]. [facility]    To contact the attending provider between 7A-7P or the covering provider during after hours 7P-7A, please log into the web site [email or web address] and access using universal [place] password for that web site. If you do not have the password, please call the hospital operator.  05/06/2020,  4:25 PM

## 2020-05-06 NOTE — Progress Notes (Signed)
Notified by telemetry patient run 13 beat of SVT, upon assessment  patient sitting on the side of the bed and in NAD, complaints only of  Left chest pain that is building up when lying down and when breathing, relieved when he sits on the side of the bed. Checked O2 saturation 96-97% on RA. MD Rathore made aware. Orders given and implented.

## 2020-05-06 NOTE — Plan of Care (Signed)
  Problem: Clinical Measurements: Goal: Cardiovascular complication will be avoided Outcome: Progressing   Problem: Fluid Volume: Goal: Compliance with measures to maintain balanced fluid volume will improve Outcome: Progressing   Problem: Clinical Measurements: Goal: Complications related to the disease process, condition or treatment will be avoided or minimized Outcome: Progressing

## 2020-05-06 NOTE — Progress Notes (Addendum)
[facility] Progress Note   Subjective:   Patient seen and examined at bedside.  Denies current CP, palpitations, SOB, abdominal pain, n/v/d, weakness and fatigue.  Admits to non productive cough.  Reports substernal chest pain/heaviness, feels like someone sitting on his chest when he lies on his back for a prolonged period of time.  Reports it does not occur in any other position. Plan for L heart cath today.   Objective Vitals:   05/05/20 2342 05/06/20 0159 05/06/20 0602 05/06/20 1006  BP:   (!) 130/94 (!) 142/69  Pulse:  87 87 73  Resp:  17 18 17   Temp:  97.6 F (36.4 C) 98 F (36.7 C) 98.2 F (36.8 C)  TempSrc:  Oral Oral Oral  SpO2: 97% 96% 98% 96%  Weight:   47 kg   Height:       Physical Exam General:WDWN male in NAD Heart:RRR, no mrg Lungs: mostly CTAB, coarse rhonchi on R improved with cough, nml WOB Abdomen:soft, NTND Extremities:no LE edema Dialysis Access: AVF   [facility] Weights   05/04/20 2027 05/05/20 0855 05/06/20 0602  Weight: 50 kg 51.1 kg 47 kg    Intake/Output Summary (Last 24 hours) at 05/06/2020 1250 Last data filed at 05/06/2020 1148 Gross per 24 hour  Intake 475.97 ml  Output --  Net 475.97 ml    Additional Objective Labs: Basic Metabolic Panel: Recent Labs  Lab 05/04/20 2034 05/05/20 0053 05/05/20 0441 05/06/20 0157 05/06/20 0545  NA 134* 136 139  --  133*  K 4.6 4.7 4.8 4.2 4.3  CL 96*  --  97*  --  90*  CO2 24  --  26  --  26  GLUCOSE 84  --  75  --  96  BUN 54*  --  51*  --  30*  CREATININE 11.52*  --  12.16*  --  8.19*  CALCIUM 9.4  --  9.5  --  9.4   CBC: Recent Labs  Lab 05/04/20 2034 05/05/20 0053 05/05/20 0441 05/06/20 0545  WBC 4.5  --  4.1 4.3  HGB 11.5* 11.9* 11.6* 12.2*  HCT 37.7* 35.0* 36.4* 38.8*  MCV 95.9  --  94.8 93.9  PLT 109*  --  84* 88*   Studies/Results: DG Chest 2 View  Result Date: 05/04/2020 CLINICAL DATA:  Shortness of breath EXAM: CHEST - 2 VIEW COMPARISON:  03/15/2020  FINDINGS: Right subclavian/innominate stent unchanged. Small bilateral effusions. Cardiomegaly with vascular congestion and mild interstitial opacities suspicious for pulmonary edema. More confluent airspace disease at both lung bases. No pneumothorax. IMPRESSION: Cardiomegaly with vascular congestion, mild interstitial opacities suspicious for pulmonary edema. Small bilateral effusions. More confluent airspace disease at the bases is suspicious for superimposed pneumonia. Electronically Signed   By: Donavan Foil M.D.   On: 05/04/2020 20:57   ECHOCARDIOGRAM COMPLETE  Result Date: 05/05/2020    ECHOCARDIOGRAM REPORT   Patient Name:   Logan Martin Date of Exam: 05/05/2020 Medical Rec #:  [id number]      Height:       64.0 in Accession #:    [id number]     Weight:       110.2 lb Date of Birth:  04-27-1956      BSA:          1.519 m Patient Age:    64 years       BP:           200/87 mmHg Patient Gender: M  HR:           95 bpm. Exam Location:  Inpatient Procedure: 2D Echo, Color Doppler and Cardiac Doppler Indications:    CHF-Acute Systolic 254.27 / C62.37  History:        Patient has prior history of Echocardiogram examinations, most                 recent 11/27/2012. COPD; Risk Factors:Hypertension, Diabetes and                 ETOH.  Sonographer:    Bernadene Person RDCS Referring Phys: Cherry  1. Compared to echo report from 2014, LV function and MR are not significantly changed.  2. Left ventricular ejection fraction, by estimation, is 50%. The left ventricle has mildly decreased function. The left ventricle has no regional wall motion abnormalities. The left ventricular internal cavity size was severely dilated. There is mild left ventricular hypertrophy. Indeterminate diastolic filling due to E-A fusion.  3. Right ventricular systolic function is low normal. The right ventricular size is normal. There is mildly elevated pulmonary artery systolic pressure.  4. Left atrial  size was severely dilated.  5. A small pericardial effusion is present.  6. MR is eccentric, directed posterior into LA At least moderately severe. Consider TEE to further define. . The mitral valve is abnormal. Moderate to severe mitral valve regurgitation.  7. Tricuspid valve regurgitation is moderate.  8. The aortic valve is tricuspid. Aortic valve regurgitation is not visualized. Mild aortic valve sclerosis is present, with no evidence of aortic valve stenosis.  9. Aortic dilatation noted. There is mild dilatation of the aortic root, measuring 40 mm. 10. The inferior vena cava is normal in size with greater than 50% respiratory variability, suggesting right atrial pressure of 3 mmHg. FINDINGS  Left Ventricle: Left ventricular ejection fraction, by estimation, is 50%. The left ventricle has mildly decreased function. The left ventricle has no regional wall motion abnormalities. The left ventricular internal cavity size was severely dilated. There is mild left ventricular hypertrophy. Indeterminate diastolic filling due to E-A fusion. Right Ventricle: The right ventricular size is normal. Right vetricular wall thickness was not assessed. Right ventricular systolic function is low normal. There is mildly elevated pulmonary artery systolic pressure. The tricuspid regurgitant velocity is  2.92 m/s, and with an assumed right atrial pressure of 3 mmHg, the estimated right ventricular systolic pressure is 62.8 mmHg. Left Atrium: Left atrial size was severely dilated. Right Atrium: Right atrial size was normal in size. Pericardium: A small pericardial effusion is present. Mitral Valve: MR is eccentric, directed posterior into LA At least moderately severe. Consider TEE to further define. The mitral valve is abnormal. There is mild thickening of the mitral valve leaflet(s). Mild mitral annular calcification. Moderate to severe mitral valve regurgitation. Tricuspid Valve: The tricuspid valve is normal in structure.  Tricuspid valve regurgitation is moderate. Aortic Valve: The aortic valve is tricuspid. Aortic valve regurgitation is not visualized. Mild aortic valve sclerosis is present, with no evidence of aortic valve stenosis. Pulmonic Valve: The pulmonic valve was grossly normal. Pulmonic valve regurgitation is mild. Aorta: Aortic dilatation noted. There is mild dilatation of the aortic root, measuring 40 mm. Venous: The inferior vena cava is normal in size with greater than 50% respiratory variability, suggesting right atrial pressure of 3 mmHg. IAS/Shunts: No atrial level shunt detected by color flow Doppler.  LEFT VENTRICLE PLAX 2D LVIDd:         5.90  cm LVIDs:         4.30 cm LV PW:         1.20 cm LV IVS:        1.00 cm LVOT diam:     2.10 cm LV SV:         67 LV SV Index:   44 LVOT Area:     3.46 cm  RIGHT VENTRICLE TAPSE (M-mode): 1.4 cm LEFT ATRIUM              Index       RIGHT ATRIUM           Index LA diam:        4.70 cm  3.09 cm/m  RA Area:     10.40 cm LA Vol (A2C):   102.0 ml 67.16 ml/m RA Volume:   19.80 ml  13.04 ml/m LA Vol (A4C):   104.0 ml 68.48 ml/m LA Biplane Vol: 103.0 ml 67.82 ml/m  AORTIC VALVE LVOT Vmax:   130.00 cm/s LVOT Vmean:  81.700 cm/s LVOT VTI:    0.194 m  AORTA Ao Root diam: 3.40 cm Ao Asc diam:  3.00 cm MR Peak grad:    194.9 mmHg  TRICUSPID VALVE MR Mean grad:    121.0 mmHg  TR Peak grad:   34.1 mmHg MR Vmax:         698.00 cm/s TR Vmax:        292.00 cm/s MR Vmean:        511.0 cm/s MR PISA:         1.01 cm    SHUNTS MR PISA Eff ROA: 5 mm       Systemic VTI:  0.19 m MR PISA Radius:  0.40 cm     Systemic Diam: 2.10 cm Dorris Carnes MD Electronically signed by Dorris Carnes MD Signature Date/Time: 05/05/2020/7:54:09 PM    Final     Medications: . sodium chloride    . sodium chloride 10 mL/hr at 05/06/20 1148   . amLODipine  10 mg Oral Daily  . aspirin EC  81 mg Oral QHS  . atorvastatin  40 mg Oral Daily  . calcitRIOL  1.5 mcg Oral Daily  . Chlorhexidine Gluconate Cloth  6 each  Topical Q0600  . ferric citrate  420 mg Oral TID WC  . hydrALAZINE  25 mg Oral TID  . metoprolol succinate  50 mg Oral Daily  . sodium chloride flush  3 mL Intravenous Q12H    Dialysis Orders: [facility]  TTS EDW 48.5  , K 2. Ca 2  HD  Time 4hr Heparin None . Access  RUA AVF Hec 2 mcg IV No ESA   Assessment/Plan: 1. Dyspnea/volume overload-respiratory distress required BiPAP in the ER.  Improved post HD, now on RA.  Net UF 3.6L removed with HD yesterday. Per weights under EDW, likely needs to be lowered on d/c.  Get standing weights to better assess. 2. Chest pain/NSTEMI/Unstable angina - Hx CAD w/PCI/stenting. plan for L heart cath today. Per cardiology.  3. ESRD - on HD TTS.  Extra HD not needed today.  Plan for HD tomorrow per regular schedule.  Will write orders incase remains inpatient.  Can return to OP schedule id d/c.   4. Hypertension  - BP mildly elevated today.  Continue amlodipine 10mg  qd, toprol 50mg  qd, hydralazine 25mg  TID.  5. Anemia  - Hgb 12.2. No indication for ESA.  6. Metabolic bone disease -  Ca in  goal.  Continue VDRA and binders.  7. Nutrition - Renal diet w/fluid restrictions. Protein supplements.      Jen Mow, PA-C [facility] 05/06/2020,12:50 PM  LOS: 1 day   Nephrology attending:  Seen and examined personally and I agree with above. In room air, clinically improved. Cath today. Plan for next HD tomorrow and like dc home.   Katheran James, MD [facility]

## 2020-05-06 NOTE — Interval H&P Note (Signed)
History and Physical Interval Note:  05/06/2020 11:55 AM  Logan Martin  has presented today for surgery, with the diagnosis of nonstemi.  The various methods of treatment have been discussed with the patient and family. After consideration of risks, benefits and other options for treatment, the patient has consented to  Procedure(s): LEFT HEART CATH AND CORONARY ANGIOGRAPHY (N/A) as a surgical intervention.  The patient's history has been reviewed, patient examined, no change in status, stable for surgery.  I have reviewed the patient's chart and labs.  Questions were answered to the patient's satisfaction.    2016 Appropriate Use Criteria for Coronary Revascularization in Patients With Acute Coronary Syndrome NSTEMI/UA High Risk (TIMI Score 5-7) NSTEMI/Unstable angina, stabilized patient at high risk Link Here: sistemancia.com Indication:  Revascularization by PCI or CABG of 1 or more arteries in a patient with NSTEMI or unstable angina with Stabilization after presentation High risk for clinical events  A (7) Indication: 16; Score 7   [place]

## 2020-05-06 NOTE — Plan of Care (Signed)
  Problem: Education: Goal: Knowledge of General Education information will improve Description Including pain rating scale, medication(s)/side effects and non-pharmacologic comfort measures Outcome: Progressing   

## 2020-05-06 NOTE — Progress Notes (Addendum)
C/O chest pain 10/10 while lying down in bed.  Had him sit up since his bed rest was up, then chest pain went away Dr. Carolin Sicks made aware.  Received page back from Dr. Carolin Sicks, he will have HD tomorrow.  Idolina Primer, RN

## 2020-05-07 DIAGNOSIS — N186 End stage renal disease: Secondary | ICD-10-CM

## 2020-05-07 DIAGNOSIS — Z992 Dependence on renal dialysis: Secondary | ICD-10-CM

## 2020-05-07 LAB — RENAL FUNCTION PANEL
Albumin: 3.4 g/dL — ABNORMAL LOW (ref 3.5–5.0)
Anion gap: 16 — ABNORMAL HIGH (ref 5–15)
BUN: 40 mg/dL — ABNORMAL HIGH (ref 8–23)
CO2: 25 mmol/L (ref 22–32)
Calcium: 9.4 mg/dL (ref 8.9–10.3)
Chloride: 93 mmol/L — ABNORMAL LOW (ref 98–111)
Creatinine, Ser: 10.7 mg/dL — ABNORMAL HIGH (ref 0.61–1.24)
GFR, Estimated: 5 mL/min — ABNORMAL LOW (ref >60)
Glucose, Bld: 111 mg/dL — ABNORMAL HIGH (ref 70–99)
Phosphorus: 5.7 mg/dL — ABNORMAL HIGH (ref 2.5–4.6)
Potassium: 4.3 mmol/L (ref 3.5–5.1)
Sodium: 134 mmol/L — ABNORMAL LOW (ref 135–145)

## 2020-05-07 LAB — CBC
HCT: 36.8 % — ABNORMAL LOW (ref 39.0–52.0)
Hemoglobin: 12.1 g/dL — ABNORMAL LOW (ref 13.0–17.0)
MCH: 30.7 pg (ref 26.0–34.0)
MCHC: 32.9 g/dL (ref 30.0–36.0)
MCV: 93.4 fL (ref 80.0–100.0)
Platelets: 105 10*3/uL — ABNORMAL LOW (ref 150–400)
RBC: 3.94 MIL/uL — ABNORMAL LOW (ref 4.22–5.81)
RDW: 15.9 % — ABNORMAL HIGH (ref 11.5–15.5)
WBC: 4.9 10*3/uL (ref 4.0–10.5)
nRBC: 0 % (ref 0.0–0.2)

## 2020-05-07 MED ORDER — METOPROLOL SUCCINATE ER 50 MG PO TB24: 50.0000 mg | ORAL_TABLET | Freq: Every day | ORAL | 0 refills | Status: DC

## 2020-05-07 MED ORDER — HYDRALAZINE HCL 25 MG PO TABS: 25.0000 mg | ORAL_TABLET | Freq: Three times a day (TID) | ORAL | 0 refills | Status: AC

## 2020-05-07 NOTE — Discharge Summary (Signed)
Physician Discharge Summary  Patient ID: CURTEZ BRALLIER MRN: [id number] DOB/AGE: [birth year] 64 y.o.  Admit date: 05/04/2020 Discharge date: 05/07/2020  Admission Diagnoses:  Discharge Diagnoses:  Principal Problem:   ESRD on dialysis [facility]) Active Problems:   CAD (coronary atherosclerotic disease)   Chest discomfort   Thrombocytopenia (HCC)   Anemia in ESRD (end-stage renal disease) (HCC)   Elevated troponin   Prolonged QT interval   Non-ST elevation (NSTEMI) myocardial infarction [facility])   Discharged Condition: stable  Hospital Course: Patient is a 64 year old male African-American male, lives with his grandson, ambulates with the help of a walker, with past medical history significant for ESRD on TTS HD, HTN, HLD, CAD s/p PCI, cardiomyopathy, anemia, thrombocytopenia, PE, hepatitis C, prior polysubstance abuse (alcohol, cocaine), PAD, hard of hearing, radical cystectomy with bilateral lymphadenectomy and radical prostatectomy in 2020 at [facility].  Patient presented with worsening dyspnea, some substernal chest discomfort and pressure, nonproductive cough, after missing hemodialysis on 05/03/2020.  In the ED noted to be tachycardic and in respiratory distress and placed on BiPAP.  Patient was admitted with acute respiratory distress due to acute pulmonary edema/acute on chronic systolic CHF likely due to missed HD and for chest pain evaluation.  Nephrology and cardiology teams were consulted to assist with patient's management.  Patient underwent cardiac catheterization on 05/06/2020 that was nonrevealing.  Patient was dialyzed during the hospital stay.  Symptoms have resolved.  Patient will be discharged back home after hemodialysis today.    Acute respiratory distress due to acute pulmonary edema/acute on chronic diastolic CHF, likely precipitated by missed hemodialysis:  Patient was admitted with acute respiratory distress.   ABG: pH 7.486, PCO2 37.2, PO2 82, HCO3 28.3 and oxygen saturation  97%.  Did not see true hypoxia documented  Chest x-ray was consistent with pulmonary edema.  Patient was initially on BiPAP continuously.    Nephrology team was consulted during the hospital stay, and patient underwent hemodialysis.  First hemodialysis was on 05/05/2020.  Patient will be discharged back on today after repeat hemodialysis.  Shortness of breath has resolved.  Patient is off BiPAP.  Patient will continue outpatient hemodialysis schedule.    CAD s/p PCI, cardiomyopathy, chest pain, elevated troponin possibly from demand ischemia:  Echo stress test 08/01/2018 at [facility] showed LVEF 30%, indeterminate with abnormal resting study and no change with stress.  Trivial AR, severe MR, trivial PR, mild TR.  No valvular stenosis.  Follows with outpatient Cardiology, Dr. Woody Seller, in [place].  Patient's troponin was mildly elevated with flat trend, 125 > 139 > 135 > 149, not consistent with ACS.  Cardiology team was consulted, and cardiac catheterization was recommended.    Cardiac cath done on 05/06/2020 did not show any obstructive coronary artery disease lesions. Presentation likely due to hypertensive urgency.  2D echo 05/05/20: LVEF has improved to 50%, moderately severe MR, moderate TR, no aortic stenosis.  Aortic root dilatation  2D echo 05/05/20 revealed aortic root dilatation of 40 mm.    For outpatient follow-up.  ESRD on TTS HD Nephrology team directed patient's care during the hospital stay.  Patient underwent 2 sessions of hemodialysis during the hospital stay.  Respiratory symptoms are resolved.  Patient will be discharged back, after hemodialysis today.  Patient will continue outpatient hemodialysis schedule.    Essential hypertension  Uncontrolled BP.  Blood pressure has improved significantly.  Primary care provider to continue to monitor.  Patient's blood pressure, and manage accordingly.   Hyperlipidemia:  Continue statins.  Anemia  in ESRD  Hemoglobin up to  12 today (at goal).  Thrombocytopenia Chronic and likely related to hep C.   Prolonged QTC  QTC seems chronically prolonged.  Continue to avoid QTC prolonging medications.  Optimize electrolytes.  Continue to monitor closely.  Body mass index is 17.79 kg/m.  Consults: cardiology and nephrology  Significant Diagnostic Studies: Cardiac catheterization  Discharge Exam: Blood pressure (!) 158/111, pulse 83, temperature (!) 97.5 F (36.4 C), temperature source Oral, resp. rate 20, height 5\' 4"  (1.626 m), weight 47.9 kg, SpO2 96 %.   Disposition: Discharge disposition: 01-Home or Self Care  Discharge Instructions    Diet - low sodium heart healthy   Complete by: As directed    Renal diet   Increase activity slowly   Complete by: As directed      Allergies as of 05/07/2020      Reactions   Heparin Rash   Pork products    Pork-derived Products Rash   Sulfa Antibiotics Rash      Medication List    TAKE these medications   amLODipine 10 MG tablet Commonly known as: NORVASC Take 1 tablet (10 mg total) by mouth daily.   aspirin EC 81 MG tablet Take 81 mg by mouth at bedtime.   atorvastatin 40 MG tablet Commonly known as: LIPITOR Take 40 mg by mouth daily.   Auryxia 1 GM 210 MG(Fe) tablet Generic drug: ferric citrate Take 420 mg by mouth 3 (three) times daily.   calcitRIOL 0.5 MCG capsule Commonly known as: ROCALTROL Take 2.5 mcg by mouth daily.   HECTOROL IV 1 Dose. Dialysis med   hydrALAZINE 25 MG tablet Commonly known as: APRESOLINE Take 1 tablet (25 mg total) by mouth 3 (three) times daily.   isosorbide mononitrate 30 MG 24 hr tablet Commonly known as: IMDUR Take 30 mg by mouth daily.   lidocaine-prilocaine cream Commonly known as: EMLA Apply 1 application topically See admin instructions. Apply small amount to dialysis port (AVF) one hour before dialysis. Cover with occlusive dressing (saran wrap)   metoprolol succinate 50 MG 24 hr  tablet Commonly known as: TOPROL-XL Take 1 tablet (50 mg total) by mouth daily. Take with or immediately following a meal. What changed: additional instructions   MIRCERA IJ 1 Dose. Dialysis med   oxyCODONE 5 MG immediate release tablet Commonly known as: Oxy IR/ROXICODONE Take 5 mg by mouth 2 (two) times daily as needed for moderate pain. What changed: Another medication with the same name was removed. Continue taking this medication, and follow the directions you see here.   VENOFER IV 1 Dose. Dialysis med        Signed: Bonnell Public 05/07/2020, 1:27 PM

## 2020-05-07 NOTE — Progress Notes (Signed)
   05/07/20 1557  Vitals  BP (!) 149/71  MAP (mmHg) 92  BP Location Left Arm  BP Method Automatic  Patient Position (if appropriate) Lying  Pulse Rate 88  Pulse Rate Source Monitor  ECG Heart Rate 86  Resp 15  During Hemodialysis Assessment  Intra-Hemodialysis Comments Tx completed  Post-Hemodialysis Assessment  Rinseback Volume (mL) 250 mL  KECN 236 V  Dialyzer Clearance Lightly streaked  Duration of HD Treatment -hour(s) 4 hour(s)  Hemodialysis Intake (mL) 700 mL  UF Total -Machine (mL) 3173 mL  Net UF (mL) 2473 mL  Tolerated HD Treatment Yes  Post-Hemodialysis Comments tx complete-pt stable  AVG/AVF Arterial Site Held (minutes) 10 minutes  AVG/AVF Venous Site Held (minutes) 10 minutes  Fistula / Graft Right Upper arm Arteriovenous fistula  No Placement Date or Time found.   Orientation: Right  Access Location: Upper arm  Access Type: Arteriovenous fistula  Site Condition No complications  Fistula / Graft Assessment Present;Thrill;Bruit  Status Deaccessed

## 2020-05-07 NOTE — Progress Notes (Addendum)
Subjective: awoken from sleep, no cos, for hd today, noted  Card cath.yest nonobstructive cad , patent renal arteries  Objective Vital signs in last 24 hours: Vitals:   05/06/20 1708 05/06/20 2014 05/06/20 2155 05/07/20 0432  BP: (!) 168/85 (!) 141/60 (!) 141/60   Pulse:      Resp:  16  18  Temp:  98.3 F (36.8 C)  98.8 F (37.1 C)  TempSrc:  Oral  Oral  SpO2:      Weight:    47.4 kg  Height:       Weight change: -3.7 kg  Physical Exam General:alert in NAD Heart:RRR, no mrg Lungs  CTA,  nml WOB Abdomen:soft, NTND Extremities:no LE edema Dialysis Access:  LUA AVF +  [facility]. Ca 2 HD Time 4hrHeparin None. AccessRUA AVF Hec 15mcg IV No ESA  Problem/Plan: 1. Dyspnea/volume overload-respiratory distress required BiPAP in the ER.(Missed dialysis txs,last HD  04/26/20) Dialysis Improved post HD, now on RA.  Net UF 3.6L removed with HD 12/09 , for HD today, Per weights under EDW, likely needs to be lowered on d/c.  Get standing weights to better assess. 2. Chest pain/NSTEMI/Unstable angina - Hx CAD w/PCI/stenting.  Cardiac cath 04/1020 nonobstructive disease normal renal arteries, plan . Per cardiology. CP related to hypertension and missed HD with volume overload 3. ESRD - on HD TTS.    Now on schedule for HD today .  4. Hypertension - BP mildly elevated today.  Probably related to missed HD Continue amlodipine 10mg  qd, toprol 50mg  qd, hydralazine 25mg  TID.  5. Anemia - Hgb 12.2. No indication for ESA.  6. Metabolic bone disease -Ca in goal.  Continue VDRA and binders.  7. Nutrition -Renal diet w/fluid restrictions. Protein supplements.    Ernest Haber, PA-C [facility] Beeper [phone] 05/07/2020,8:10 AM  LOS: 2 days   Labs: Basic Metabolic Panel: Recent Labs  Lab 05/04/20 2034 05/05/20 0053 05/05/20 0441 05/06/20 0157 05/06/20 0545  NA 134* 136 139  --  133*  K 4.6 4.7 4.8 4.2 4.3  CL 96*   --  97*  --  90*  CO2 24  --  26  --  26  GLUCOSE 84  --  75  --  96  BUN 54*  --  51*  --  30*  CREATININE 11.52*  --  12.16*  --  8.19*  CALCIUM 9.4  --  9.5  --  9.4   Liver Function Tests: No results for input(s): AST, ALT, ALKPHOS, BILITOT, PROT, ALBUMIN in the last 168 hours. No results for input(s): LIPASE, AMYLASE in the last 168 hours. No results for input(s): AMMONIA in the last 168 hours. CBC: Recent Labs  Lab 05/04/20 2034 05/05/20 0053 05/05/20 0441 05/06/20 0545  WBC 4.5  --  4.1 4.3  HGB 11.5* 11.9* 11.6* 12.2*  HCT 37.7* 35.0* 36.4* 38.8*  MCV 95.9  --  94.8 93.9  PLT 109*  --  84* 88*   Cardiac Enzymes: No results for input(s): CKTOTAL, CKMB, CKMBINDEX, TROPONINI in the last 168 hours. CBG: No results for input(s): GLUCAP in the last 168 hours.  Studies/Results: CARDIAC CATHETERIZATION  Result Date: 05/06/2020 LM: Normal LAD: Distal LAD myocardial bridge LCx: Normal Ramus: Normal RCA: Prox RCA patent stent. No ISR         Distal RCA 40% stenosis Bilateral renal arteries: Patent with no stenosis LVEDP 29 mmHg Impression: Non-obstructive coronary artery disease Patent renal  arteries Presentation likely due to hypertensive urgency  ECHOCARDIOGRAM COMPLETE  Result Date: 05/05/2020    ECHOCARDIOGRAM REPORT   Patient Name:   Logan Martin Date of Exam: 05/05/2020 Medical Rec #:  [id number]      Height:       64.0 in Accession #:    [id number]     Weight:       110.2 lb Date of Birth:  Jun 14, 1955      BSA:          1.519 m Patient Age:    64 years       BP:           200/87 mmHg Patient Gender: M              HR:           95 bpm. Exam Location:  Inpatient Procedure: 2D Echo, Color Doppler and Cardiac Doppler Indications:    CHF-Acute Systolic 585.27 / P82.42  History:        Patient has prior history of Echocardiogram examinations, most                 recent 11/27/2012. COPD; Risk Factors:Hypertension, Diabetes and                 ETOH.  Sonographer:    Bernadene Person  RDCS Referring Phys: [place]  1. Compared to echo report from 2014, LV function and MR are not significantly changed.  2. Left ventricular ejection fraction, by estimation, is 50%. The left ventricle has mildly decreased function. The left ventricle has no regional wall motion abnormalities. The left ventricular internal cavity size was severely dilated. There is mild left ventricular hypertrophy. Indeterminate diastolic filling due to E-A fusion.  3. Right ventricular systolic function is low normal. The right ventricular size is normal. There is mildly elevated pulmonary artery systolic pressure.  4. Left atrial size was severely dilated.  5. A small pericardial effusion is present.  6. MR is eccentric, directed posterior into LA At least moderately severe. Consider TEE to further define. . The mitral valve is abnormal. Moderate to severe mitral valve regurgitation.  7. Tricuspid valve regurgitation is moderate.  8. The aortic valve is tricuspid. Aortic valve regurgitation is not visualized. Mild aortic valve sclerosis is present, with no evidence of aortic valve stenosis.  9. Aortic dilatation noted. There is mild dilatation of the aortic root, measuring 40 mm. 10. The inferior vena cava is normal in size with greater than 50% respiratory variability, suggesting right atrial pressure of 3 mmHg. FINDINGS  Left Ventricle: Left ventricular ejection fraction, by estimation, is 50%. The left ventricle has mildly decreased function. The left ventricle has no regional wall motion abnormalities. The left ventricular internal cavity size was severely dilated. There is mild left ventricular hypertrophy. Indeterminate diastolic filling due to E-A fusion. Right Ventricle: The right ventricular size is normal. Right vetricular wall thickness was not assessed. Right ventricular systolic function is low normal. There is mildly elevated pulmonary artery systolic pressure. The tricuspid regurgitant  velocity is  2.92 m/s, and with an assumed right atrial pressure of 3 mmHg, the estimated right ventricular systolic pressure is 35.3 mmHg. Left Atrium: Left atrial size was severely dilated. Right Atrium: Right atrial size was normal in size. Pericardium: A small pericardial effusion is present. Mitral Valve: MR is eccentric, directed posterior into LA At least moderately severe. Consider TEE to further define. The mitral valve is abnormal.  There is mild thickening of the mitral valve leaflet(s). Mild mitral annular calcification. Moderate to severe mitral valve regurgitation. Tricuspid Valve: The tricuspid valve is normal in structure. Tricuspid valve regurgitation is moderate. Aortic Valve: The aortic valve is tricuspid. Aortic valve regurgitation is not visualized. Mild aortic valve sclerosis is present, with no evidence of aortic valve stenosis. Pulmonic Valve: The pulmonic valve was grossly normal. Pulmonic valve regurgitation is mild. Aorta: Aortic dilatation noted. There is mild dilatation of the aortic root, measuring 40 mm. Venous: The inferior vena cava is normal in size with greater than 50% respiratory variability, suggesting right atrial pressure of 3 mmHg. IAS/Shunts: No atrial level shunt detected by color flow Doppler.  LEFT VENTRICLE PLAX 2D LVIDd:         5.90 cm LVIDs:         4.30 cm LV PW:         1.20 cm LV IVS:        1.00 cm LVOT diam:     2.10 cm LV SV:         67 LV SV Index:   44 LVOT Area:     3.46 cm  RIGHT VENTRICLE TAPSE (M-mode): 1.4 cm LEFT ATRIUM              Index       RIGHT ATRIUM           Index LA diam:        4.70 cm  3.09 cm/m  RA Area:     10.40 cm LA Vol (A2C):   102.0 ml 67.16 ml/m RA Volume:   19.80 ml  13.04 ml/m LA Vol (A4C):   104.0 ml 68.48 ml/m LA Biplane Vol: 103.0 ml 67.82 ml/m  AORTIC VALVE LVOT Vmax:   130.00 cm/s LVOT Vmean:  81.700 cm/s LVOT VTI:    0.194 m  AORTA Ao Root diam: 3.40 cm Ao Asc diam:  3.00 cm MR Peak grad:    194.9 mmHg  TRICUSPID VALVE  MR Mean grad:    121.0 mmHg  TR Peak grad:   34.1 mmHg MR Vmax:         698.00 cm/s TR Vmax:        292.00 cm/s MR Vmean:        511.0 cm/s MR PISA:         1.01 cm    SHUNTS MR PISA Eff ROA: 5 mm       Systemic VTI:  0.19 m MR PISA Radius:  0.40 cm     Systemic Diam: 2.10 cm Dorris Carnes MD Electronically signed by Dorris Carnes MD Signature Date/Time: 05/05/2020/7:54:09 PM    Final    Medications: . sodium chloride     . amLODipine  10 mg Oral Daily  . aspirin EC  81 mg Oral QHS  . atorvastatin  40 mg Oral Daily  . calcitRIOL  1.5 mcg Oral Daily  . Chlorhexidine Gluconate Cloth  6 each Topical Q0600  . ferric citrate  420 mg Oral TID WC  . hydrALAZINE  25 mg Oral TID  . metoprolol succinate  50 mg Oral Daily  . sodium chloride flush  3 mL Intravenous Q12H    Nephrology attending: Patient was seen and examined personally and I agree with assessment and plan as outlined above. Cardiac cath unremarkable.  Symptoms improved after dialysis.  Plan for HD today.  Katheran James, MD [facility].

## 2020-05-08 ENCOUNTER — Telehealth: Payer: Self-pay | Admitting: Nephrology

## 2020-05-08 NOTE — Telephone Encounter (Signed)
Transition of Care Contact from [place]   Date of Discharge: 05/07/20 Date of Contact: 05/08/20 Method of contact: phone Talked to patient   Patient contacted to discuss transition of care form recent hospitaliztion. Patient was admitted to [facility] from 12/8 to 05/07/20 with the discharge diagnosis of chest pain likely due to hypertensive urgency.  S/p cardiac cath on 12/10 with no obstructive lesions.     Medication changes were reviewed.   Patient will follow up with is outpatient dialysis center 05/10/20.   Other follow up needs include - patient would like to return to home therapies when able.  Concerned about transportation to dialysis - advised to call transport company and sent message to SW at outpatient HD.   Jen Mow, PA-C [facility] Pager: [phone]

## 2020-05-09 ENCOUNTER — Encounter (HOSPITAL_COMMUNITY): Payer: Self-pay | Admitting: Cardiology

## 2020-06-10 DIAGNOSIS — K7689 Other specified diseases of liver: Secondary | ICD-10-CM | POA: Insufficient documentation

## 2020-08-19 ENCOUNTER — Other Ambulatory Visit: Payer: Self-pay

## 2020-08-19 ENCOUNTER — Emergency Department (HOSPITAL_COMMUNITY): Payer: Medicare HMO

## 2020-08-19 ENCOUNTER — Inpatient Hospital Stay (HOSPITAL_COMMUNITY)
Admission: EM | Admit: 2020-08-19 | Discharge: 2020-08-31 | DRG: 799 | Discharge status: Against Medical Advice | Payer: Medicare HMO | Attending: Internal Medicine | Admitting: Internal Medicine

## 2020-08-19 DIAGNOSIS — R000 Tachycardia, unspecified: Secondary | ICD-10-CM | POA: Diagnosis not present

## 2020-08-19 DIAGNOSIS — Z79899 Other long term (current) drug therapy: Secondary | ICD-10-CM

## 2020-08-19 DIAGNOSIS — Z809 Family history of malignant neoplasm, unspecified: Secondary | ICD-10-CM

## 2020-08-19 DIAGNOSIS — K2970 Gastritis, unspecified, without bleeding: Secondary | ICD-10-CM | POA: Diagnosis present

## 2020-08-19 DIAGNOSIS — E440 Moderate protein-calorie malnutrition: Secondary | ICD-10-CM | POA: Diagnosis present

## 2020-08-19 DIAGNOSIS — I700 Atherosclerosis of aorta: Secondary | ICD-10-CM | POA: Diagnosis present

## 2020-08-19 DIAGNOSIS — I8500 Esophageal varices without bleeding: Secondary | ICD-10-CM | POA: Diagnosis present

## 2020-08-19 DIAGNOSIS — R109 Unspecified abdominal pain: Secondary | ICD-10-CM

## 2020-08-19 DIAGNOSIS — Z8719 Personal history of other diseases of the digestive system: Secondary | ICD-10-CM

## 2020-08-19 DIAGNOSIS — Z4659 Encounter for fitting and adjustment of other gastrointestinal appliance and device: Secondary | ICD-10-CM

## 2020-08-19 DIAGNOSIS — Z419 Encounter for procedure for purposes other than remedying health state, unspecified: Secondary | ICD-10-CM

## 2020-08-19 DIAGNOSIS — D735 Infarction of spleen: Secondary | ICD-10-CM | POA: Diagnosis not present

## 2020-08-19 DIAGNOSIS — Z681 Body mass index (BMI) 19 or less, adult: Secondary | ICD-10-CM

## 2020-08-19 DIAGNOSIS — H9190 Unspecified hearing loss, unspecified ear: Secondary | ICD-10-CM | POA: Diagnosis present

## 2020-08-19 DIAGNOSIS — R54 Age-related physical debility: Secondary | ICD-10-CM | POA: Diagnosis not present

## 2020-08-19 DIAGNOSIS — K920 Hematemesis: Secondary | ICD-10-CM | POA: Diagnosis not present

## 2020-08-19 DIAGNOSIS — J9601 Acute respiratory failure with hypoxia: Secondary | ICD-10-CM

## 2020-08-19 DIAGNOSIS — K635 Polyp of colon: Secondary | ICD-10-CM | POA: Diagnosis present

## 2020-08-19 DIAGNOSIS — D631 Anemia in chronic kidney disease: Secondary | ICD-10-CM | POA: Diagnosis present

## 2020-08-19 DIAGNOSIS — R079 Chest pain, unspecified: Secondary | ICD-10-CM

## 2020-08-19 DIAGNOSIS — Z5329 Procedure and treatment not carried out because of patient's decision for other reasons: Secondary | ICD-10-CM | POA: Diagnosis not present

## 2020-08-19 DIAGNOSIS — K4090 Unilateral inguinal hernia, without obstruction or gangrene, not specified as recurrent: Secondary | ICD-10-CM | POA: Diagnosis present

## 2020-08-19 DIAGNOSIS — I252 Old myocardial infarction: Secondary | ICD-10-CM

## 2020-08-19 DIAGNOSIS — I739 Peripheral vascular disease, unspecified: Secondary | ICD-10-CM | POA: Diagnosis present

## 2020-08-19 DIAGNOSIS — N2581 Secondary hyperparathyroidism of renal origin: Secondary | ICD-10-CM | POA: Diagnosis present

## 2020-08-19 DIAGNOSIS — Z8249 Family history of ischemic heart disease and other diseases of the circulatory system: Secondary | ICD-10-CM

## 2020-08-19 DIAGNOSIS — S3609XA Other injury of spleen, initial encounter: Secondary | ICD-10-CM | POA: Diagnosis present

## 2020-08-19 DIAGNOSIS — Z9115 Patient's noncompliance with renal dialysis: Secondary | ICD-10-CM

## 2020-08-19 DIAGNOSIS — Y828 Other medical devices associated with adverse incidents: Secondary | ICD-10-CM | POA: Diagnosis present

## 2020-08-19 DIAGNOSIS — K226 Gastro-esophageal laceration-hemorrhage syndrome: Secondary | ICD-10-CM | POA: Diagnosis present

## 2020-08-19 DIAGNOSIS — K449 Diaphragmatic hernia without obstruction or gangrene: Secondary | ICD-10-CM | POA: Diagnosis present

## 2020-08-19 DIAGNOSIS — N189 Chronic kidney disease, unspecified: Secondary | ICD-10-CM | POA: Diagnosis present

## 2020-08-19 DIAGNOSIS — D62 Acute posthemorrhagic anemia: Secondary | ICD-10-CM | POA: Diagnosis present

## 2020-08-19 DIAGNOSIS — Z9079 Acquired absence of other genital organ(s): Secondary | ICD-10-CM

## 2020-08-19 DIAGNOSIS — D696 Thrombocytopenia, unspecified: Secondary | ICD-10-CM | POA: Diagnosis present

## 2020-08-19 DIAGNOSIS — R443 Hallucinations, unspecified: Secondary | ICD-10-CM | POA: Diagnosis not present

## 2020-08-19 DIAGNOSIS — J439 Emphysema, unspecified: Secondary | ICD-10-CM | POA: Diagnosis present

## 2020-08-19 DIAGNOSIS — Z992 Dependence on renal dialysis: Secondary | ICD-10-CM

## 2020-08-19 DIAGNOSIS — J9600 Acute respiratory failure, unspecified whether with hypoxia or hypercapnia: Secondary | ICD-10-CM

## 2020-08-19 DIAGNOSIS — Z20822 Contact with and (suspected) exposure to covid-19: Secondary | ICD-10-CM | POA: Diagnosis present

## 2020-08-19 DIAGNOSIS — Z7982 Long term (current) use of aspirin: Secondary | ICD-10-CM

## 2020-08-19 DIAGNOSIS — N13729 Vesicoureteral-reflux with reflux nephropathy without hydroureter, unspecified: Secondary | ICD-10-CM | POA: Diagnosis present

## 2020-08-19 DIAGNOSIS — K257 Chronic gastric ulcer without hemorrhage or perforation: Secondary | ICD-10-CM | POA: Diagnosis present

## 2020-08-19 DIAGNOSIS — K648 Other hemorrhoids: Secondary | ICD-10-CM | POA: Diagnosis present

## 2020-08-19 DIAGNOSIS — K660 Peritoneal adhesions (postprocedural) (postinfection): Secondary | ICD-10-CM | POA: Diagnosis present

## 2020-08-19 DIAGNOSIS — K3189 Other diseases of stomach and duodenum: Secondary | ICD-10-CM | POA: Diagnosis present

## 2020-08-19 DIAGNOSIS — M545 Low back pain, unspecified: Secondary | ICD-10-CM | POA: Diagnosis present

## 2020-08-19 DIAGNOSIS — K05219 Aggressive periodontitis, localized, unspecified severity: Secondary | ICD-10-CM

## 2020-08-19 DIAGNOSIS — L8990 Pressure ulcer of unspecified site, unspecified stage: Secondary | ICD-10-CM | POA: Insufficient documentation

## 2020-08-19 DIAGNOSIS — Z87891 Personal history of nicotine dependence: Secondary | ICD-10-CM

## 2020-08-19 DIAGNOSIS — T68XXXA Hypothermia, initial encounter: Secondary | ICD-10-CM | POA: Diagnosis not present

## 2020-08-19 DIAGNOSIS — Z833 Family history of diabetes mellitus: Secondary | ICD-10-CM

## 2020-08-19 DIAGNOSIS — Z888 Allergy status to other drugs, medicaments and biological substances status: Secondary | ICD-10-CM

## 2020-08-19 DIAGNOSIS — G8929 Other chronic pain: Secondary | ICD-10-CM | POA: Diagnosis present

## 2020-08-19 DIAGNOSIS — E872 Acidosis: Secondary | ICD-10-CM | POA: Diagnosis present

## 2020-08-19 DIAGNOSIS — E875 Hyperkalemia: Secondary | ICD-10-CM | POA: Diagnosis present

## 2020-08-19 DIAGNOSIS — I82210 Acute embolism and thrombosis of superior vena cava: Secondary | ICD-10-CM | POA: Diagnosis present

## 2020-08-19 DIAGNOSIS — R0989 Other specified symptoms and signs involving the circulatory and respiratory systems: Secondary | ICD-10-CM

## 2020-08-19 DIAGNOSIS — L89312 Pressure ulcer of right buttock, stage 2: Secondary | ICD-10-CM | POA: Diagnosis present

## 2020-08-19 DIAGNOSIS — I953 Hypotension of hemodialysis: Secondary | ICD-10-CM | POA: Diagnosis not present

## 2020-08-19 DIAGNOSIS — Z882 Allergy status to sulfonamides status: Secondary | ICD-10-CM

## 2020-08-19 DIAGNOSIS — E785 Hyperlipidemia, unspecified: Secondary | ICD-10-CM | POA: Diagnosis present

## 2020-08-19 DIAGNOSIS — R110 Nausea: Secondary | ICD-10-CM

## 2020-08-19 DIAGNOSIS — I120 Hypertensive chronic kidney disease with stage 5 chronic kidney disease or end stage renal disease: Secondary | ICD-10-CM | POA: Diagnosis present

## 2020-08-19 DIAGNOSIS — E871 Hypo-osmolality and hyponatremia: Secondary | ICD-10-CM | POA: Diagnosis not present

## 2020-08-19 DIAGNOSIS — K5733 Diverticulitis of large intestine without perforation or abscess with bleeding: Secondary | ICD-10-CM | POA: Diagnosis present

## 2020-08-19 DIAGNOSIS — F1011 Alcohol abuse, in remission: Secondary | ICD-10-CM | POA: Diagnosis present

## 2020-08-19 DIAGNOSIS — I2510 Atherosclerotic heart disease of native coronary artery without angina pectoris: Secondary | ICD-10-CM | POA: Diagnosis present

## 2020-08-19 DIAGNOSIS — Z8551 Personal history of malignant neoplasm of bladder: Secondary | ICD-10-CM

## 2020-08-19 DIAGNOSIS — J9690 Respiratory failure, unspecified, unspecified whether with hypoxia or hypercapnia: Secondary | ICD-10-CM

## 2020-08-19 DIAGNOSIS — K7689 Other specified diseases of liver: Secondary | ICD-10-CM | POA: Diagnosis present

## 2020-08-19 DIAGNOSIS — K921 Melena: Secondary | ICD-10-CM | POA: Diagnosis present

## 2020-08-19 DIAGNOSIS — K661 Hemoperitoneum: Secondary | ICD-10-CM | POA: Diagnosis present

## 2020-08-19 DIAGNOSIS — Z9109 Other allergy status, other than to drugs and biological substances: Secondary | ICD-10-CM

## 2020-08-19 DIAGNOSIS — K05319 Chronic periodontitis, localized, unspecified severity: Secondary | ICD-10-CM | POA: Diagnosis present

## 2020-08-19 DIAGNOSIS — T82868A Thrombosis of vascular prosthetic devices, implants and grafts, initial encounter: Secondary | ICD-10-CM | POA: Diagnosis present

## 2020-08-19 DIAGNOSIS — K766 Portal hypertension: Secondary | ICD-10-CM | POA: Diagnosis present

## 2020-08-19 DIAGNOSIS — F1411 Cocaine abuse, in remission: Secondary | ICD-10-CM | POA: Diagnosis present

## 2020-08-19 DIAGNOSIS — R34 Anuria and oliguria: Secondary | ICD-10-CM | POA: Diagnosis not present

## 2020-08-19 DIAGNOSIS — D61818 Other pancytopenia: Secondary | ICD-10-CM | POA: Diagnosis present

## 2020-08-19 DIAGNOSIS — R0683 Snoring: Secondary | ICD-10-CM | POA: Diagnosis present

## 2020-08-19 DIAGNOSIS — I701 Atherosclerosis of renal artery: Secondary | ICD-10-CM | POA: Diagnosis present

## 2020-08-19 DIAGNOSIS — J810 Acute pulmonary edema: Secondary | ICD-10-CM | POA: Diagnosis present

## 2020-08-19 DIAGNOSIS — F05 Delirium due to known physiological condition: Secondary | ICD-10-CM | POA: Diagnosis not present

## 2020-08-19 DIAGNOSIS — Z86711 Personal history of pulmonary embolism: Secondary | ICD-10-CM

## 2020-08-19 DIAGNOSIS — B1920 Unspecified viral hepatitis C without hepatic coma: Secondary | ICD-10-CM | POA: Diagnosis present

## 2020-08-19 DIAGNOSIS — R0789 Other chest pain: Secondary | ICD-10-CM | POA: Diagnosis present

## 2020-08-19 DIAGNOSIS — S35291A Minor laceration of branches of celiac and mesenteric artery, initial encounter: Secondary | ICD-10-CM | POA: Diagnosis present

## 2020-08-19 DIAGNOSIS — K122 Cellulitis and abscess of mouth: Secondary | ICD-10-CM | POA: Diagnosis present

## 2020-08-19 DIAGNOSIS — E8770 Fluid overload, unspecified: Secondary | ICD-10-CM | POA: Diagnosis present

## 2020-08-19 DIAGNOSIS — K219 Gastro-esophageal reflux disease without esophagitis: Secondary | ICD-10-CM | POA: Diagnosis present

## 2020-08-19 DIAGNOSIS — Z23 Encounter for immunization: Secondary | ICD-10-CM

## 2020-08-19 DIAGNOSIS — J811 Chronic pulmonary edema: Secondary | ICD-10-CM

## 2020-08-19 DIAGNOSIS — N186 End stage renal disease: Secondary | ICD-10-CM

## 2020-08-19 DIAGNOSIS — R578 Other shock: Secondary | ICD-10-CM | POA: Diagnosis not present

## 2020-08-19 DIAGNOSIS — Z87442 Personal history of urinary calculi: Secondary | ICD-10-CM

## 2020-08-19 LAB — TROPONIN I (HIGH SENSITIVITY)
Troponin I (High Sensitivity): 59 ng/L — ABNORMAL HIGH (ref <18)
Troponin I (High Sensitivity): 60 ng/L — ABNORMAL HIGH (ref <18)

## 2020-08-19 LAB — CBC
HCT: 25.2 % — ABNORMAL LOW (ref 39.0–52.0)
Hemoglobin: 7.9 g/dL — ABNORMAL LOW (ref 13.0–17.0)
MCH: 32.5 pg (ref 26.0–34.0)
MCHC: 31.3 g/dL (ref 30.0–36.0)
MCV: 103.7 fL — ABNORMAL HIGH (ref 80.0–100.0)
Platelets: 124 10*3/uL — ABNORMAL LOW (ref 150–400)
RBC: 2.43 MIL/uL — ABNORMAL LOW (ref 4.22–5.81)
RDW: 15.9 % — ABNORMAL HIGH (ref 11.5–15.5)
WBC: 5.3 10*3/uL (ref 4.0–10.5)
nRBC: 0 % (ref 0.0–0.2)

## 2020-08-19 LAB — BASIC METABOLIC PANEL
Anion gap: 13 (ref 5–15)
BUN: 89 mg/dL — ABNORMAL HIGH (ref 8–23)
CO2: 29 mmol/L (ref 22–32)
Calcium: 9.2 mg/dL (ref 8.9–10.3)
Chloride: 96 mmol/L — ABNORMAL LOW (ref 98–111)
Creatinine, Ser: 9.27 mg/dL — ABNORMAL HIGH (ref 0.61–1.24)
GFR, Estimated: 6 mL/min — ABNORMAL LOW (ref >60)
Glucose, Bld: 90 mg/dL (ref 70–99)
Potassium: 5.2 mmol/L — ABNORMAL HIGH (ref 3.5–5.1)
Sodium: 138 mmol/L (ref 135–145)

## 2020-08-19 LAB — PROTIME-INR
INR: 1.1 (ref 0.8–1.2)
Prothrombin Time: 13.8 seconds (ref 11.4–15.2)

## 2020-08-19 LAB — MAGNESIUM: Magnesium: 1.9 mg/dL (ref 1.7–2.4)

## 2020-08-19 MED ORDER — ADENOSINE 6 MG/2ML IV SOLN
6.0000 mg | Freq: Once | INTRAVENOUS | Status: AC
  Administered 2020-08-20: 6 mg via INTRAVENOUS
  Filled 2020-08-19: qty 2

## 2020-08-19 MED ORDER — LACTATED RINGERS IV SOLN: INTRAVENOUS | Status: DC

## 2020-08-19 MED ORDER — SODIUM CHLORIDE 0.9 % IV SOLN
3.0000 g | Freq: Once | INTRAVENOUS | Status: DC
  Filled 2020-08-19: qty 8

## 2020-08-19 MED ORDER — SODIUM ZIRCONIUM CYCLOSILICATE 10 G PO PACK: 10.0000 g | PACK | Freq: Once | ORAL | Status: DC

## 2020-08-19 NOTE — ED Notes (Signed)
Patient transported to X-ray 

## 2020-08-19 NOTE — ED Triage Notes (Signed)
Pt arrives to ED BIB [facility] due to CP and SHOB. Per EMS Pt missed dialysis today due to feeling weak and having back pain. Pt does dialysis Mon, Wed, Fri.   BP 130/60 HR 130 R 18 O2 96% CBG 118

## 2020-08-19 NOTE — Code Documentation (Signed)
22Lforearm not patent, secondary peripheral IV attempted to be placed at this time.

## 2020-08-20 ENCOUNTER — Inpatient Hospital Stay (HOSPITAL_COMMUNITY): Payer: Medicare HMO

## 2020-08-20 DIAGNOSIS — R000 Tachycardia, unspecified: Secondary | ICD-10-CM | POA: Diagnosis present

## 2020-08-20 DIAGNOSIS — D62 Acute posthemorrhagic anemia: Secondary | ICD-10-CM | POA: Diagnosis present

## 2020-08-20 DIAGNOSIS — I82210 Acute embolism and thrombosis of superior vena cava: Secondary | ICD-10-CM | POA: Diagnosis present

## 2020-08-20 DIAGNOSIS — D631 Anemia in chronic kidney disease: Secondary | ICD-10-CM

## 2020-08-20 DIAGNOSIS — K921 Melena: Secondary | ICD-10-CM | POA: Diagnosis present

## 2020-08-20 DIAGNOSIS — N189 Chronic kidney disease, unspecified: Secondary | ICD-10-CM

## 2020-08-20 DIAGNOSIS — D61818 Other pancytopenia: Secondary | ICD-10-CM

## 2020-08-20 DIAGNOSIS — R579 Shock, unspecified: Secondary | ICD-10-CM | POA: Diagnosis not present

## 2020-08-20 DIAGNOSIS — K2970 Gastritis, unspecified, without bleeding: Secondary | ICD-10-CM | POA: Diagnosis not present

## 2020-08-20 DIAGNOSIS — Z992 Dependence on renal dialysis: Secondary | ICD-10-CM | POA: Diagnosis not present

## 2020-08-20 DIAGNOSIS — R079 Chest pain, unspecified: Secondary | ICD-10-CM | POA: Diagnosis not present

## 2020-08-20 DIAGNOSIS — J810 Acute pulmonary edema: Secondary | ICD-10-CM | POA: Diagnosis present

## 2020-08-20 DIAGNOSIS — R578 Other shock: Secondary | ICD-10-CM | POA: Diagnosis not present

## 2020-08-20 DIAGNOSIS — I739 Peripheral vascular disease, unspecified: Secondary | ICD-10-CM

## 2020-08-20 DIAGNOSIS — I8510 Secondary esophageal varices without bleeding: Secondary | ICD-10-CM | POA: Diagnosis not present

## 2020-08-20 DIAGNOSIS — I2510 Atherosclerotic heart disease of native coronary artery without angina pectoris: Secondary | ICD-10-CM | POA: Diagnosis not present

## 2020-08-20 DIAGNOSIS — S35291A Minor laceration of branches of celiac and mesenteric artery, initial encounter: Secondary | ICD-10-CM | POA: Diagnosis present

## 2020-08-20 DIAGNOSIS — R109 Unspecified abdominal pain: Secondary | ICD-10-CM | POA: Diagnosis not present

## 2020-08-20 DIAGNOSIS — K766 Portal hypertension: Secondary | ICD-10-CM | POA: Diagnosis present

## 2020-08-20 DIAGNOSIS — S3609XA Other injury of spleen, initial encounter: Secondary | ICD-10-CM | POA: Diagnosis present

## 2020-08-20 DIAGNOSIS — K635 Polyp of colon: Secondary | ICD-10-CM | POA: Diagnosis not present

## 2020-08-20 DIAGNOSIS — D735 Infarction of spleen: Secondary | ICD-10-CM | POA: Diagnosis present

## 2020-08-20 DIAGNOSIS — K226 Gastro-esophageal laceration-hemorrhage syndrome: Secondary | ICD-10-CM | POA: Diagnosis present

## 2020-08-20 DIAGNOSIS — J9601 Acute respiratory failure with hypoxia: Secondary | ICD-10-CM | POA: Diagnosis not present

## 2020-08-20 DIAGNOSIS — E872 Acidosis: Secondary | ICD-10-CM | POA: Diagnosis present

## 2020-08-20 DIAGNOSIS — K122 Cellulitis and abscess of mouth: Secondary | ICD-10-CM | POA: Diagnosis present

## 2020-08-20 DIAGNOSIS — Z681 Body mass index (BMI) 19 or less, adult: Secondary | ICD-10-CM | POA: Diagnosis not present

## 2020-08-20 DIAGNOSIS — Y828 Other medical devices associated with adverse incidents: Secondary | ICD-10-CM | POA: Diagnosis present

## 2020-08-20 DIAGNOSIS — E440 Moderate protein-calorie malnutrition: Secondary | ICD-10-CM | POA: Diagnosis present

## 2020-08-20 DIAGNOSIS — T82868A Thrombosis of vascular prosthetic devices, implants and grafts, initial encounter: Secondary | ICD-10-CM | POA: Diagnosis present

## 2020-08-20 DIAGNOSIS — K922 Gastrointestinal hemorrhage, unspecified: Secondary | ICD-10-CM | POA: Diagnosis not present

## 2020-08-20 DIAGNOSIS — R1010 Upper abdominal pain, unspecified: Secondary | ICD-10-CM | POA: Diagnosis not present

## 2020-08-20 DIAGNOSIS — K5733 Diverticulitis of large intestine without perforation or abscess with bleeding: Secondary | ICD-10-CM | POA: Diagnosis present

## 2020-08-20 DIAGNOSIS — Z20822 Contact with and (suspected) exposure to covid-19: Secondary | ICD-10-CM | POA: Diagnosis present

## 2020-08-20 DIAGNOSIS — I120 Hypertensive chronic kidney disease with stage 5 chronic kidney disease or end stage renal disease: Secondary | ICD-10-CM | POA: Diagnosis present

## 2020-08-20 DIAGNOSIS — K661 Hemoperitoneum: Secondary | ICD-10-CM | POA: Diagnosis present

## 2020-08-20 DIAGNOSIS — J9600 Acute respiratory failure, unspecified whether with hypoxia or hypercapnia: Secondary | ICD-10-CM | POA: Diagnosis not present

## 2020-08-20 DIAGNOSIS — F05 Delirium due to known physiological condition: Secondary | ICD-10-CM | POA: Diagnosis not present

## 2020-08-20 DIAGNOSIS — N186 End stage renal disease: Secondary | ICD-10-CM | POA: Diagnosis present

## 2020-08-20 DIAGNOSIS — Z23 Encounter for immunization: Secondary | ICD-10-CM | POA: Diagnosis present

## 2020-08-20 HISTORY — PX: IR US GUIDE VASC ACCESS LEFT: IMG2389

## 2020-08-20 HISTORY — PX: IR US GUIDE VASC ACCESS RIGHT: IMG2390

## 2020-08-20 HISTORY — PX: IR VENO/JUGULAR RIGHT: IMG2275

## 2020-08-20 HISTORY — PX: IR FLUORO GUIDE CV LINE LEFT: IMG2282

## 2020-08-20 LAB — COMPREHENSIVE METABOLIC PANEL
ALT: 12 U/L (ref 0–44)
AST: 18 U/L (ref 15–41)
Albumin: 3.4 g/dL — ABNORMAL LOW (ref 3.5–5.0)
Alkaline Phosphatase: 74 U/L (ref 38–126)
Anion gap: 16 — ABNORMAL HIGH (ref 5–15)
BUN: 96 mg/dL — ABNORMAL HIGH (ref 8–23)
CO2: 23 mmol/L (ref 22–32)
Calcium: 9.5 mg/dL (ref 8.9–10.3)
Chloride: 97 mmol/L — ABNORMAL LOW (ref 98–111)
Creatinine, Ser: 9.76 mg/dL — ABNORMAL HIGH (ref 0.61–1.24)
GFR, Estimated: 5 mL/min — ABNORMAL LOW (ref >60)
Glucose, Bld: 93 mg/dL (ref 70–99)
Potassium: 5 mmol/L (ref 3.5–5.1)
Sodium: 136 mmol/L (ref 135–145)
Total Bilirubin: 1.1 mg/dL (ref 0.3–1.2)
Total Protein: 6.2 g/dL — ABNORMAL LOW (ref 6.5–8.1)

## 2020-08-20 LAB — HEMOGLOBIN AND HEMATOCRIT, BLOOD
HCT: 25.2 % — ABNORMAL LOW (ref 39.0–52.0)
Hemoglobin: 8.5 g/dL — ABNORMAL LOW (ref 13.0–17.0)

## 2020-08-20 LAB — BASIC METABOLIC PANEL
Anion gap: 6 (ref 5–15)
BUN: 30 mg/dL — ABNORMAL HIGH (ref 8–23)
CO2: 29 mmol/L (ref 22–32)
Calcium: 9 mg/dL (ref 8.9–10.3)
Chloride: 99 mmol/L (ref 98–111)
Creatinine, Ser: 4.61 mg/dL — ABNORMAL HIGH (ref 0.61–1.24)
GFR, Estimated: 13 mL/min — ABNORMAL LOW (ref >60)
Glucose, Bld: 95 mg/dL (ref 70–99)
Potassium: 3.3 mmol/L — ABNORMAL LOW (ref 3.5–5.1)
Sodium: 134 mmol/L — ABNORMAL LOW (ref 135–145)

## 2020-08-20 LAB — IRON AND TIBC
Iron: 61 ug/dL (ref 45–182)
Saturation Ratios: 29 % (ref 17.9–39.5)
TIBC: 209 ug/dL — ABNORMAL LOW (ref 250–450)
UIBC: 148 ug/dL

## 2020-08-20 LAB — CBC
HCT: 19.9 % — ABNORMAL LOW (ref 39.0–52.0)
Hemoglobin: 6.4 g/dL — CL (ref 13.0–17.0)
MCH: 32.7 pg (ref 26.0–34.0)
MCHC: 32.2 g/dL (ref 30.0–36.0)
MCV: 101.5 fL — ABNORMAL HIGH (ref 80.0–100.0)
Platelets: 167 10*3/uL (ref 150–400)
RBC: 1.96 MIL/uL — ABNORMAL LOW (ref 4.22–5.81)
RDW: 16.1 % — ABNORMAL HIGH (ref 11.5–15.5)
WBC: 4.8 10*3/uL (ref 4.0–10.5)
nRBC: 0 % (ref 0.0–0.2)

## 2020-08-20 LAB — PREPARE RBC (CROSSMATCH)

## 2020-08-20 LAB — SARS CORONAVIRUS 2 (TAT 6-24 HRS): SARS Coronavirus 2: NEGATIVE

## 2020-08-20 LAB — OCCULT BLOOD X 1 CARD TO LAB, STOOL: Fecal Occult Bld: POSITIVE — AB

## 2020-08-20 LAB — HEPATITIS B SURFACE ANTIGEN: Hepatitis B Surface Ag: NONREACTIVE

## 2020-08-20 LAB — FERRITIN: Ferritin: 1386 ng/mL — ABNORMAL HIGH (ref 24–336)

## 2020-08-20 LAB — RETICULOCYTES
Immature Retic Fract: 15 % (ref 2.3–15.9)
RBC.: 1.94 MIL/uL — ABNORMAL LOW (ref 4.22–5.81)
Retic Count, Absolute: 31.6 10*3/uL (ref 19.0–186.0)
Retic Ct Pct: 1.6 % (ref 0.4–3.1)

## 2020-08-20 LAB — LACTIC ACID, PLASMA
Lactic Acid, Venous: 0.7 mmol/L (ref 0.5–1.9)
Lactic Acid, Venous: 1.6 mmol/L (ref 0.5–1.9)

## 2020-08-20 LAB — VITAMIN B12: Vitamin B-12: 272 pg/mL (ref 180–914)

## 2020-08-20 LAB — FOLATE: Folate: 13.4 ng/mL (ref >5.9)

## 2020-08-20 MED ORDER — DARBEPOETIN ALFA 60 MCG/0.3ML IJ SOSY
PREFILLED_SYRINGE | INTRAMUSCULAR | Status: AC
  Filled 2020-08-20: qty 0.3

## 2020-08-20 MED ORDER — IOHEXOL 300 MG/ML  SOLN
50.0000 mL | Freq: Once | INTRAMUSCULAR | Status: AC | PRN
  Administered 2020-08-20: 15 mL via INTRAVENOUS

## 2020-08-20 MED ORDER — FERRIC CITRATE 1 GM 210 MG(FE) PO TABS
420.0000 mg | ORAL_TABLET | Freq: Three times a day (TID) | ORAL | Status: DC
Start: 2020-08-20 — End: 2020-08-23
  Administered 2020-08-20 – 2020-08-23 (×6): 420 mg via ORAL
  Filled 2020-08-20 (×11): qty 2

## 2020-08-20 MED ORDER — METHOCARBAMOL 500 MG PO TABS: 750.0000 mg | ORAL_TABLET | Freq: Three times a day (TID) | ORAL | Status: DC | PRN

## 2020-08-20 MED ORDER — SODIUM CHLORIDE 0.9% IV SOLUTION: Freq: Once | INTRAVENOUS | Status: AC

## 2020-08-20 MED ORDER — SODIUM ZIRCONIUM CYCLOSILICATE 5 G PO PACK
5.0000 g | PACK | Freq: Every day | ORAL | Status: DC
  Administered 2020-08-21: 5 g via ORAL
  Filled 2020-08-20 (×2): qty 1

## 2020-08-20 MED ORDER — LIDOCAINE HCL 1 % IJ SOLN
INTRAMUSCULAR | Status: DC | PRN
  Administered 2020-08-20: 10 mL

## 2020-08-20 MED ORDER — ACETAMINOPHEN 325 MG PO TABS: 650.0000 mg | ORAL_TABLET | Freq: Four times a day (QID) | ORAL | Status: DC | PRN

## 2020-08-20 MED ORDER — CHLORHEXIDINE GLUCONATE 0.12 % MT SOLN
15.0000 mL | Freq: Four times a day (QID) | OROMUCOSAL | Status: DC
  Administered 2020-08-20 – 2020-08-23 (×6): 15 mL via OROMUCOSAL
  Filled 2020-08-20 (×7): qty 15

## 2020-08-20 MED ORDER — SODIUM CHLORIDE 0.9% IV SOLUTION: Freq: Once | INTRAVENOUS | Status: DC

## 2020-08-20 MED ORDER — TRAZODONE HCL 50 MG PO TABS
150.0000 mg | ORAL_TABLET | Freq: Every day | ORAL | Status: DC
  Administered 2020-08-20 – 2020-08-22 (×3): 150 mg via ORAL
  Filled 2020-08-20 (×3): qty 1

## 2020-08-20 MED ORDER — ATORVASTATIN CALCIUM 40 MG PO TABS
40.0000 mg | ORAL_TABLET | Freq: Every day | ORAL | Status: DC
  Administered 2020-08-21 – 2020-08-23 (×2): 40 mg via ORAL
  Filled 2020-08-20 (×3): qty 1

## 2020-08-20 MED ORDER — PANTOPRAZOLE SODIUM 40 MG IV SOLR
40.0000 mg | Freq: Two times a day (BID) | INTRAVENOUS | Status: DC
  Administered 2020-08-23 – 2020-08-31 (×15): 40 mg via INTRAVENOUS
  Filled 2020-08-20 (×15): qty 40

## 2020-08-20 MED ORDER — HYDROXYZINE HCL 25 MG PO TABS
50.0000 mg | ORAL_TABLET | Freq: Every day | ORAL | Status: DC
  Administered 2020-08-21: 50 mg via ORAL
  Filled 2020-08-20 (×4): qty 2

## 2020-08-20 MED ORDER — ADENOSINE 6 MG/2ML IV SOLN
INTRAVENOUS | Status: AC | PRN
  Administered 2020-08-20: 6 mg via INTRAVENOUS

## 2020-08-20 MED ORDER — LEVALBUTEROL HCL 0.63 MG/3ML IN NEBU: 0.6300 mg | INHALATION_SOLUTION | Freq: Four times a day (QID) | RESPIRATORY_TRACT | Status: DC | PRN

## 2020-08-20 MED ORDER — ONDANSETRON HCL 4 MG PO TABS: 4.0000 mg | ORAL_TABLET | Freq: Four times a day (QID) | ORAL | Status: DC | PRN

## 2020-08-20 MED ORDER — HEPARIN SOD (PORK) LOCK FLUSH 100 UNIT/ML IV SOLN
INTRAVENOUS | Status: AC
  Filled 2020-08-20: qty 5

## 2020-08-20 MED ORDER — ARGATROBAN 50 MG/50ML IV SOLN
1.0000 ug/kg/min | INTRAVENOUS | Status: DC
  Filled 2020-08-20: qty 50

## 2020-08-20 MED ORDER — CHLORHEXIDINE GLUCONATE CLOTH 2 % EX PADS
6.0000 | MEDICATED_PAD | Freq: Every day | CUTANEOUS | Status: DC
  Administered 2020-08-20 – 2020-08-23 (×2): 6 via TOPICAL

## 2020-08-20 MED ORDER — IOHEXOL 350 MG/ML SOLN
100.0000 mL | Freq: Once | INTRAVENOUS | Status: AC | PRN
  Administered 2020-08-20: 100 mL via INTRAVENOUS

## 2020-08-20 MED ORDER — DARBEPOETIN ALFA 60 MCG/0.3ML IJ SOSY
60.0000 ug | PREFILLED_SYRINGE | Freq: Once | INTRAMUSCULAR | Status: AC
  Administered 2020-08-20: 60 ug via INTRAVENOUS
  Filled 2020-08-20: qty 0.3

## 2020-08-20 MED ORDER — AMOXICILLIN-POT CLAVULANATE 500-125 MG PO TABS
1.0000 | ORAL_TABLET | Freq: Every day | ORAL | Status: DC
  Administered 2020-08-20 (×2): 500 mg via ORAL
  Filled 2020-08-20 (×3): qty 1

## 2020-08-20 MED ORDER — SODIUM CHLORIDE 0.9 % IV SOLN
8.0000 mg/h | INTRAVENOUS | Status: AC
  Administered 2020-08-20 – 2020-08-22 (×4): 8 mg/h via INTRAVENOUS
  Filled 2020-08-20 (×7): qty 80

## 2020-08-20 MED ORDER — ACETAMINOPHEN 650 MG RE SUPP
650.0000 mg | Freq: Four times a day (QID) | RECTAL | Status: DC | PRN
Start: 2020-08-20 — End: 2020-08-23

## 2020-08-20 MED ORDER — ONDANSETRON HCL 4 MG/2ML IJ SOLN
4.0000 mg | Freq: Four times a day (QID) | INTRAMUSCULAR | Status: DC | PRN
  Administered 2020-08-22 – 2020-08-27 (×2): 4 mg via INTRAVENOUS
  Filled 2020-08-20 (×2): qty 2

## 2020-08-20 MED ORDER — SODIUM CHLORIDE 0.9 % IV SOLN
80.0000 mg | Freq: Once | INTRAVENOUS | Status: AC
  Administered 2020-08-20: 80 mg via INTRAVENOUS
  Filled 2020-08-20: qty 80

## 2020-08-20 MED ORDER — LABETALOL HCL 100 MG PO TABS
100.0000 mg | ORAL_TABLET | Freq: Three times a day (TID) | ORAL | Status: DC
  Filled 2020-08-20: qty 1

## 2020-08-20 MED ORDER — LIDOCAINE HCL 1 % IJ SOLN
INTRAMUSCULAR | Status: AC
  Filled 2020-08-20: qty 20

## 2020-08-20 NOTE — Progress Notes (Signed)
PROGRESS NOTE                                                                             PROGRESS NOTE                                                                                                                                                                                                             Patient Demographics:    Logan Martin, is a 65 y.o. male, DOB - 1956-01-08, KDT:[id number]  Outpatient Primary MD for the patient is [name], NP    LOS - 0  Admit date - 08/19/2020    Chief Complaint  Patient presents with  . Chest Pain       Brief Narrative    This is a no charge note as patient was seen and admitted earlier today, patient was seen and examined, chart, imaging and labs were reviewed.  FATE CASTER is a 65 y.o. male, with PMH of ESRD, HTN, HLD, CAD, history of adenocarcinoma of the bladder, anemia, thrombocytopenia, hx of PE, hepatitis C who presented to the ER on 08/19/2020 with general malaise, tachycardia.  Patient states that this morning he woke up and felt weak, overall poorly with some dyspnea, and some slight chest discomfort.  He has had similar symptoms in the past as he has had issues with volume overload, CAD and PE.  He was last treated here in December 2021 for dyspnea and substernal chest pain.  He had a heart cath performed that was nonrevealing.  However with his symptoms today, he was concerned he might have Covid even though he has been fully vaccinated.  Patient recently traveled from outside state visiting family.  He took a Science writer and got back on Thursday.  While visiting family he was doing inside her hemodialysis, last had hemodialysis on Wednesday.  It sounds like he had clotting in the tubing while there and the blood was unable to be rinsed back.  Typically at home he does home hemodialysis 4 times a week.  However did not do dialysis today due to feeling weak.  He has a history of PE many years ago  and has been on aspirin daily.  He was previously treated with anticoagulation for about a year but cannot tell me what he took, but he says it is a pill.  He has allergies to heparin products.  Due to his symptoms, he called EMS for further evaluation.  ED course: -Vitals on admission:, Heart rate 135, respiratory rate 19, blood pressure 113/55, maintaining sats on room air -Labs on initial presentation: Sodium 138, potassium 5.2, chloride 96, bicarb 29, glucose 90, BUN 89, creatinine 9.27, troponin 59, WBC 5.3, hemoglobin 7.9, platelets 124 -Imaging obtained on admission: Chest x-ray showed mild cardiomegaly, no interstitial edema -In the ED patient was given adenosine.  Adenosine did not reveal A. fib or a flutter.showed sinus tachycardia, but patient remained sinus tachycardic.  CTA of the chest was ordered however did not have adequate access so unable to be obtained.  ER provider also ordered Unasyn and Lokelma.  However patient's IV access stopped working and could not get access to give.  The hospitalist service was contacted for further evaluation and management.    Subjective:    [name] today had couple episodes of melena this morning, he reports dyspnea, denies any chest pain, dizziness or lightheadedness.      Assessment  & Plan :    Active Problems:   Atherosclerosis of native coronary artery of native heart without angina pectoris   ESRD on dialysis (HCC)   Anemia associated with chronic renal failure   Pancytopenia (HCC)   Peripheral vascular disease (HCC)   Tachycardia   Oral infection  Symptomatic anemia -Most likely acute blood loss anemia, with anemia of chronic kidney disease -Secondary to GI bleed, given he is having significant melena. -Tachycardia related to anemia, very low clinical suspicion for PE, no further work-up for PE indicated at this point, no indication for anticoagulation especially with GI bleed.  Acute blood loss anemia/GI bleed -Patient  will be transfused 1 unit PRBC currently, will give another unit during hemodialysis. -Noted on Protonix drip. -We will obtain CTA abdomen pelvis to evaluate source of GI bleed. -GI Consulted, further work-up pending CTA results.   ESRD on hemodialysis -Renal consulted, for hemodialysis today  Hyperkalemia -Secondary to ESRD -Continue home Lokelma  Oral infection -On physical exam, appears to have gum line abscess.  Appears to be very minimal, no indication for abscess, for now continue with Augmentin, and and chlorhexidine mouthwash.  Essential hypertension -Hold medication given GI bleed.     SpO2: 100 % O2 Flow Rate (L/min): 2 L/min  Recent Labs  Lab 08/19/20 1855 08/19/20 1916 08/19/20 2125 08/20/20 0021 08/20/20 0404  WBC  --   --  5.3  --  4.8  PLT  --   --  124*  --  167  AST  --   --   --   --  18  ALT  --   --   --   --  12  ALKPHOS  --   --   --   --  74  BILITOT  --   --   --   --  1.1  ALBUMIN  --   --   --   --  3.4*  INR 1.1  --   --   --   --   LATICACIDVEN  --   --   --  0.7 1.6  SARSCOV2NAA  --  NEGATIVE  --   --   --        ABG     Component Value Date/Time   PHART 7.486 (H) 05/05/2020 0053   PCO2ART 37.2 05/05/2020 0053   PO2ART 82 (L) 05/05/2020 0053   HCO3 28.3 (H) 05/05/2020 0053   TCO2 30 05/05/2020 0053   ACIDBASEDEF 1.0 03/15/2020 1331   O2SAT 97.0 05/05/2020 0053      Condition - Extremely Guarded  Family Communication  :  full  Code Status :  Full  Consults  :  GI,renal  Procedures  :    Disposition Plan  :    Status is: Inpatient  Remains inpatient appropriate because:IV treatments appropriate due to intensity of illness or inability to take PO   Dispo: The patient is from: Home              Anticipated d/c is to: Home              Patient currently is not medically stable to d/c.   Difficult to place patient No      DVT Prophylaxis  :   SCDs   Lab Results  Component Value Date   PLT 167 08/20/2020     Diet :  Diet Order            Diet NPO time specified  Diet effective now                  Inpatient Medications  Scheduled Meds: . sodium chloride   Intravenous Once  . sodium chloride   Intravenous Once  . amoxicillin-clavulanate  1 tablet Oral Daily  . atorvastatin  40 mg Oral Daily  . Chlorhexidine Gluconate Cloth  6 each Topical Q0600  . darbepoetin (ARANESP) injection - DIALYSIS  60 mcg Intravenous Once  . ferric citrate  420 mg Oral TID  . heparin lock flush      . hydrOXYzine  50 mg Oral Q0600  . labetalol  100 mg Oral Q8H  . lidocaine      . [START ON 08/23/2020] pantoprazole  40 mg Intravenous Q12H  . sodium zirconium cyclosilicate  10 g Oral Once  . sodium zirconium cyclosilicate  5 g Oral Daily   Continuous Infusions: . pantoprozole (PROTONIX) infusion    . pantoprazole (PROTONIX) 80 mg IVPB 80 mg (08/20/20 1236)   PRN Meds:.acetaminophen **OR** acetaminophen, levalbuterol, lidocaine, ondansetron **OR** ondansetron (ZOFRAN) IV  Antibiotics  :    Anti-infectives (From admission, onward)   Start     Dose/Rate Route Frequency Ordered Stop   08/20/20 0415  amoxicillin-clavulanate (AUGMENTIN) 500-125 MG per tablet 500 mg        1 tablet Oral Daily 08/20/20 0329     08/19/20 2345  Ampicillin-Sulbactam (UNASYN) 3 g in sodium chloride 0.9 % 100 mL IVPB  Status:  Discontinued        3 g 200 mL/hr over 30 Minutes Intravenous  Once 08/19/20 2333 08/20/20 0329        Emeline Gins Elgergawy M.D on 08/20/2020 at 12:53 PM  To page go to [email or web address]   [facility] -  Office  [phone]    Objective:   Vitals:   08/20/20 1000 08/20/20 1219 08/20/20 1223 08/20/20 1245  BP: 128/81 (!) 147/103 (!) 151/80 (!) 126/98  Pulse: (!) 102 82 80 99  Resp: 15 18 20 20   Temp:  97.8 F (36.6 C) 98.2 F (36.8 C) 98.6 F (37 C)  TempSrc:  Oral Oral Oral  SpO2: 96% 100% 100% 100%  Weight:      Height:        Wt Readings from Last 3 Encounters:  08/20/20 49.1  kg  05/07/20 45.1 kg  03/29/20 50.2 kg    No intake or output data in the 24 hours ending 08/20/20 1253   Physical Exam  Awake Alert, No new F.N deficits, Normal affect Symmetrical Chest wall movement, Good air movement bilaterally, CTAB Tachycardic,No Gallops,Rubs or new Murmurs, No Parasternal Heave +ve B.Sounds, Abd Soft, No tenderness, No rebound - guarding or rigidity. No Cyanosis, Clubbing or edema, No new Rash or bruise      Data Review:    CBC Recent Labs  Lab 08/19/20 2125 08/20/20 0404  WBC 5.3 4.8  HGB 7.9* 6.4*  HCT 25.2* 19.9*  PLT 124* 167  MCV 103.7* 101.5*  MCH 32.5 32.7  MCHC 31.3 32.2  RDW 15.9* 16.1*    Recent Labs  Lab 08/19/20 1855 08/19/20 1916 08/20/20 0021 08/20/20 0404  NA 138  --   --  136  K 5.2*  --   --  5.0  CL 96*  --   --  97*  CO2 29  --   --  23  GLUCOSE 90  --   --  93  BUN 89*  --   --  96*  CREATININE 9.27*  --   --  9.76*  CALCIUM 9.2  --   --  9.5  AST  --   --   --  18  ALT  --   --   --  12  ALKPHOS  --   --   --  74  BILITOT  --   --   --  1.1  ALBUMIN  --   --   --  3.4*  MG  --  1.9  --   --   LATICACIDVEN  --   --  0.7 1.6  INR 1.1  --   --   --     ------------------------------------------------------------------------------------------------------------------ No results for input(s): CHOL, HDL, LDLCALC, TRIG, CHOLHDL, LDLDIRECT in the last 72 hours.  No results found for: HGBA1C ------------------------------------------------------------------------------------------------------------------ No results for input(s): TSH, T4TOTAL, T3FREE, THYROIDAB in the last 72 hours.  Invalid input(s): FREET3  Cardiac Enzymes No results for input(s): CKMB, TROPONINI, MYOGLOBIN in the last 168 hours.  Invalid input(s): CK ------------------------------------------------------------------------------------------------------------------    Component Value Date/Time   BNP 3,725.8 (H) 05/05/2020 1602    Micro  Results Recent Results (from the past 240 hour(s))  SARS CORONAVIRUS 2 (TAT 6-24 HRS) Nasopharyngeal Nasopharyngeal Swab     Status: None   Collection Time: 08/19/20  7:16 PM   Specimen: Nasopharyngeal Swab  Result Value Ref Range Status   SARS Coronavirus 2 NEGATIVE NEGATIVE Final    Comment: (NOTE) SARS-CoV-2 target nucleic acids are NOT DETECTED.  The SARS-CoV-2 RNA is generally detectable in upper and lower respiratory specimens during the acute phase of infection. Negative results do not preclude SARS-CoV-2 infection, do not rule out co-infections with other pathogens, and should not be used as the sole basis for treatment or other patient management decisions. Negative results must be combined with clinical observations, patient history, and epidemiological information. The expected result is Negative.  Fact Sheet for Patients: SugarRoll.be  Fact Sheet for Healthcare Providers: https://www.woods-mathews.com/  This test is not yet approved or cleared by the [place] FDA and  has been authorized for detection and/or diagnosis  of SARS-CoV-2 by FDA under an Emergency Use Authorization (EUA). This EUA will remain  in effect (meaning this test can be used) for the duration of the COVID-19 declaration under Se ction 564(b)(1) of the Act, 21 U.S.C. section 360bbb-3(b)(1), unless the authorization is terminated or revoked sooner.  Performed at [facility], [place] [address] [place], [place] [address]     Radiology Reports DG Chest 2 View  Result Date: 08/19/2020 CLINICAL DATA:  Chest pain shortness of breath EXAM: CHEST - 2 VIEW COMPARISON:  Chest radiograph May 04, 2020 FINDINGS: Right subclavian/innominate stent, unchanged. Mild cardiac enlargement. Central vascular congestion. Mild basilar predominant interstitial opacities. The visualized skeletal structures are unremarkable. IMPRESSION: Mild cardiac enlargement with  central vascular congestion and basilar predominant interstitial opacities, likely mild edema. Electronically Signed   By: Dahlia Bailiff MD   On: 08/19/2020 20:18

## 2020-08-20 NOTE — ED Notes (Signed)
Critical Care at bedside in regards to access, pt refusing at this time stating " I will want until I consult with Nephrology, I take all responsbility but I do not want to be stuck anymore."

## 2020-08-20 NOTE — ED Notes (Signed)
Logan Fuse, MD made aware of pt having no peripheral access at this time and made aware of pt request to place IV in lower extremities due to multiple attempts to obtain access.

## 2020-08-20 NOTE — Progress Notes (Signed)
ANTICOAGULATION CONSULT NOTE - Initial Consult  Pharmacy Consult for Argatroban  Indication: Rule out PE  Allergies  Allergen Reactions  . Heparin Rash    Pork products   . Pork-Derived Products Rash  . Sulfa Antibiotics Rash    Patient Measurements: Height: 5\' 4"  (162.6 cm) Weight: 45.4 kg (100 lb) IBW/kg (Calculated) : 59.2  Vital Signs: Temp: 99.3 F (37.4 C) (03/25 1850) Temp Source: Oral (03/25 1850) BP: 129/89 (03/26 0129) Pulse Rate: 126 (03/26 0129)  Labs: Recent Labs    08/19/20 1855 08/19/20 2125 08/19/20 2200  HGB  --  7.9*  --   HCT  --  25.2*  --   PLT  --  124*  --   LABPROT 13.8  --   --   INR 1.1  --   --   CREATININE 9.27*  --   --   TROPONINIHS 59*  --  60*    Estimated Creatinine Clearance: 5.2 mL/min (A) (by C-G formula based on SCr of 9.27 mg/dL (H)).   Medical History: Past Medical History:  Diagnosis Date  . Alcohol abuse    quit date in 1995  . Anemia   . Cancer ([place])    bladder  . Chronic low back pain   . Cocaine abuse in remission ([place])    Quit date in 1995  . ESRD (end stage renal disease) ([place])    Home Dialysis  . 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 Started HD in [place], [place].  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  On NxStage home hemo since 2010. Using RUA AVF.    [name] Hearing aid worn    B/L  . Hepatitis C    s/p treatment with Mavyret  . History of blood transfusion 2007 X 1  . History of kidney stones   . Hx of constipation   . Hypertension   . Left inguinal hernia   . Peripheral vascular disease ([place])   . Pulmonary embolism [facility]) Feb 2012   Treated with coumadin x 1 year  . Wears glasses     Assessment: 65 y/o M presents to the ED with chest pain and weakness, also some shortness of breath prior to ED arrival. Recent air travel. Pt noted to have history of PE, NOT on any current anti-coagulation.Pt has ESRD on HD. Heparin allergy noted. Noted Hgb of 7.9, pt  denies any active bleeding. Pt is supposed to get CT angio when they are able to place an adequate IV site-current IV doesn't meet CT requirements.   Goal of Therapy:  aPTT 50-90 secs seconds Monitor platelets by anticoagulation protocol: Yes   Plan:  Start Argatroban at 1 mcg/kg/min Check aPTT 4 hours after initiation Monitor closely for bleeding F/U ability to get CT Angio  Narda Bonds, PharmD, [place] Pharmacist Phone: [phone]

## 2020-08-20 NOTE — ED Notes (Signed)
IV team at bedside per RN with team pt refusing to allow RN to place ultrasound guided IV in any other site expect restricted extremity and lower extremity, but has concerning risk factors for lower extremity IV placement. Charge Nurse Susanne Borders, MD made aware of current situation in regards to pt request regarding peripheral IV. Pt provided update to plan of care at this time.

## 2020-08-20 NOTE — ED Notes (Signed)
CT unable to perform scan due to 20G located in IJV, provider to be made aware.

## 2020-08-20 NOTE — Progress Notes (Signed)
Pt left the floor with blood transfusion and protonix gtt infusing; Pre vitals and 15 min post vitals documented; Post vitals taken in dialysis; MD requested that second bag of blood be transfused in dialysis; Dialysis RN transfused. Will continue to monitor.

## 2020-08-20 NOTE — Consult Note (Signed)
CONSULT NOTE FOR [facility]  Reason for Consult: Hematochezia Referring Physician: [facility]  Logan Martin HPI: This is a 65 year old male with a PMH of ESRD on dialysis, HCV, bladder cancer, anemia, colonic adenomas (02/2009 Dr. Henrene Pastor), and PE admitted for complaints of feeling weak.  His symptoms started in the AM.  At that time he recalls feeling weak with some associated chest discomfort and SOB.  With these symptoms he was concerned about COVID and he presented with further evaluation.  Dr. Charolette Child evaluated the patient with a cardiac catherization 05/06/2020 only to find minimal RCA disease.  The patient does recall in the past having similar symptoms and it was associated with heart failure or PE.  In the ER he was noted to be tachycardic and administration of adenosine only showed sinus tachycardia.  Because of losing IV access further imaging for a PE was not possible.  He was also noted today to have hematochezia, "so they say", but he states that he did not witness the event.  The patient does not report any issues with abdominal pain, nausea, vomiting, diarrhea, or constipation.  Past Medical History:  Diagnosis Date  . Alcohol abuse    quit date in 1995  . Anemia   . Cancer ([place])    bladder  . Chronic low back pain   . Cocaine abuse in remission ([place])    Quit date in 1995  . ESRD (end stage renal disease) ([place])    Home Dialysis  . 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 Started HD in [place], [place].  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  On NxStage home hemo since 2010. Using RUA AVF.    [name] Hearing aid worn    B/L  . Hepatitis C    s/p treatment with Mavyret  . History of blood transfusion 2007 X 1  . History of kidney stones   . Hx of constipation   . Hypertension   . Left inguinal hernia   . Peripheral vascular disease ([place])   . Pulmonary embolism [facility]) Feb 2012   Treated with coumadin x 1 year  . Wears  glasses     Past Surgical History:  Procedure Laterality Date  . A/V FISTULAGRAM N/A 11/22/2017   Procedure: A/V Fistulagram;  Surgeon: Serafina Mitchell, MD;  Location: [facility];  Service: Cardiovascular;  Laterality: N/A;  rt. arm   . ABDOMINAL HERNIA REPAIR  2000    x 2  . AV FISTULA PLACEMENT Right 2007   upper arm  . AV FISTULA REPAIR Right ~ 03/2016 & 04/2016  . BLADDER AUGMENTATION  1975  . COLONOSCOPY    . CORONARY ANGIOPLASTY WITH STENT PLACEMENT  1999  . CYSTOSCOPY W/ URETERAL STENT PLACEMENT N/A 10/13/2017   Procedure: CYSTOSCOPY WITH IRRIGATION OF BLADDER PLACEMENT OF FOLEY;  Surgeon: Ceasar Mons, MD;  Location: [facility];  Service: Urology;  Laterality: N/A;  . CYSTOSCOPY WITH FULGERATION N/A 03/27/2018   Procedure: [place] AND MUCOUS EVACUATION, BLADDER BIOPSY;  Surgeon: Franchot Gallo, MD;  Location: [facility];  Service: Urology;  Laterality: N/A;  . ESOPHAGOGASTRODUODENOSCOPY    . EXTRACORPOREAL SHOCK WAVE LITHOTRIPSY    . FISTULA SUPERFICIALIZATION Right 06/02/1094   Procedure: PLICATION OF ANEURYSM OF ARTERIOVENOUS FISTULA;  Surgeon: Angelia Mould, MD;  Location: [facility];  Service: Vascular;  Laterality: Right;  . HERNIA REPAIR    . INGUINAL HERNIA REPAIR Right 1975  .  INGUINAL HERNIA REPAIR Left 03/04/2019   Procedure: LEFT OPEN INGUINAL HERNIA REPAIR WITH MESH;  Surgeon: Kinsinger, Arta Bruce, MD;  Location: [facility];  Service: General;  Laterality: Left;  GENERAL AND LMA  . INSERTION OF DIALYSIS CATHETER Left 03/15/2020   Procedure: INSERTION OF Left Internal Jugular DIALYSIS CATHETER;  Surgeon: [name], MD;  Location: [facility];  Service: Vascular;  Laterality: Left;  . IR FLUORO GUIDE CV LINE LEFT  03/11/2020  . IR US GUIDE VASC ACCESS LEFT  03/11/2020  . LEFT HEART CATH AND CORONARY ANGIOGRAPHY N/A 05/06/2020   Procedure: LEFT HEART CATH AND CORONARY ANGIOGRAPHY;  Surgeon: Nigel Mormon, MD;  Location: [facility];  Service: Cardiovascular;  Laterality: N/A;  . PERIPHERAL VASCULAR BALLOON ANGIOPLASTY  11/22/2017   Procedure: PERIPHERAL VASCULAR BALLOON ANGIOPLASTY;  Surgeon: Serafina Mitchell, MD;  Location: [facility];  Service: Cardiovascular;;  rt. arm fistula  . PERIPHERAL VASCULAR CATHETERIZATION N/A 06/22/2016   Procedure: Left Arm Venography;  Surgeon: Elam Dutch, MD;  Location: [facility];  Service: Cardiovascular;  Laterality: N/A;  . PERIPHERAL VASCULAR CATHETERIZATION N/A 06/22/2016   Procedure: A/V Fistulagram - Right Arm;  Surgeon: Elam Dutch, MD;  Location: [facility];  Service: Cardiovascular;  Laterality: N/A;  . PERIPHERAL VASCULAR CATHETERIZATION Right 06/22/2016   Procedure: Peripheral Vascular Balloon Angioplasty;  Surgeon: Elam Dutch, MD;  Location: [facility];  Service: Cardiovascular;  Laterality: Right;  arm fistula  . REVISON OF ARTERIOVENOUS FISTULA Right 03/15/2020   Procedure: RIGHT ARM ARTERIOVENOUS FISTULA REVISON;  Surgeon: [name], MD;  Location: [facility];  Service: Vascular;  Laterality: Right;    Family History  Problem Relation Age of Onset  . Cancer Mother   . Diabetes Mother   . Hypertension Mother   . Deep vein thrombosis Daughter     Social History:  reports that he quit smoking about 27 years ago. His smoking use included cigarettes. He has a 5.00 pack-year smoking history. He has never used smokeless tobacco. He reports previous alcohol use. He reports previous drug use. Drugs: Marijuana and Cocaine.  Allergies:  Allergies  Allergen Reactions  . Heparin Rash    Pork products   . Pork-Derived Products Rash  . Sulfa Antibiotics Rash    Medications:  Scheduled: . sodium chloride   Intravenous Once  . sodium chloride   Intravenous Once  . amoxicillin-clavulanate  1 tablet Oral Daily  . atorvastatin  40 mg Oral Daily  . Chlorhexidine Gluconate Cloth  6 each Topical Q0600  . darbepoetin (ARANESP)  injection - DIALYSIS  60 mcg Intravenous Once  . ferric citrate  420 mg Oral TID  . heparin lock flush      . hydrOXYzine  50 mg Oral Q0600  . labetalol  100 mg Oral Q8H  . lidocaine      . [START ON 08/23/2020] pantoprazole  40 mg Intravenous Q12H  . sodium zirconium cyclosilicate  10 g Oral Once  . sodium zirconium cyclosilicate  5 g Oral Daily   Continuous: . pantoprozole (PROTONIX) infusion    . pantoprazole (PROTONIX) 80 mg IVPB 80 mg (08/20/20 1236)    Results for orders placed or performed during the hospital encounter of 08/19/20 (from the past 24 hour(s))  Basic metabolic panel     Status: Abnormal   Collection Time: 08/19/20  6:55 PM  Result Value Ref Range   Sodium 138 135 -  145 mmol/L   Potassium 5.2 (H) 3.5 - 5.1 mmol/L   Chloride 96 (L) 98 - 111 mmol/L   CO2 29 22 - 32 mmol/L   Glucose, Bld 90 70 - 99 mg/dL   BUN 89 (H) 8 - 23 mg/dL   Creatinine, Ser 9.27 (H) 0.61 - 1.24 mg/dL   Calcium 9.2 8.9 - 10.3 mg/dL   GFR, Estimated 6 (L) >60 mL/min   Anion gap 13 5 - 15  Troponin I (High Sensitivity)     Status: Abnormal   Collection Time: 08/19/20  6:55 PM  Result Value Ref Range   Troponin I (High Sensitivity) 59 (H) <18 ng/L  Protime-INR (order if Patient is taking Coumadin / Warfarin)     Status: None   Collection Time: 08/19/20  6:55 PM  Result Value Ref Range   Prothrombin Time 13.8 11.4 - 15.2 seconds   INR 1.1 0.8 - 1.2  Magnesium     Status: None   Collection Time: 08/19/20  7:16 PM  Result Value Ref Range   Magnesium 1.9 1.7 - 2.4 mg/dL  SARS CORONAVIRUS 2 (TAT 6-24 HRS) Nasopharyngeal Nasopharyngeal Swab     Status: None   Collection Time: 08/19/20  7:16 PM   Specimen: Nasopharyngeal Swab  Result Value Ref Range   SARS Coronavirus 2 NEGATIVE NEGATIVE  CBC     Status: Abnormal   Collection Time: 08/19/20  9:25 PM  Result Value Ref Range   WBC 5.3 4.0 - 10.5 K/uL   RBC 2.43 (L) 4.22 - 5.81 MIL/uL   Hemoglobin 7.9 (L) 13.0 - 17.0 g/dL   HCT 25.2 (L)  39.0 - 52.0 %   MCV 103.7 (H) 80.0 - 100.0 fL   MCH 32.5 26.0 - 34.0 pg   MCHC 31.3 30.0 - 36.0 g/dL   RDW 15.9 (H) 11.5 - 15.5 %   Platelets 124 (L) 150 - 400 K/uL   nRBC 0.0 0.0 - 0.2 %  Troponin I (High Sensitivity)     Status: Abnormal   Collection Time: 08/19/20 10:00 PM  Result Value Ref Range   Troponin I (High Sensitivity) 60 (H) <18 ng/L  Vitamin B12     Status: None   Collection Time: 08/20/20 12:21 AM  Result Value Ref Range   Vitamin B-12 272 180 - 914 pg/mL  Folate     Status: None   Collection Time: 08/20/20 12:21 AM  Result Value Ref Range   Folate 13.4 >5.9 ng/mL  Iron and TIBC     Status: Abnormal   Collection Time: 08/20/20 12:21 AM  Result Value Ref Range   Iron 61 45 - 182 ug/dL   TIBC 209 (L) 250 - 450 ug/dL   Saturation Ratios 29 17.9 - 39.5 %   UIBC 148 ug/dL  Ferritin     Status: Abnormal   Collection Time: 08/20/20 12:21 AM  Result Value Ref Range   Ferritin 1,386 (H) 24 - 336 ng/mL  Lactic acid, plasma     Status: None   Collection Time: 08/20/20 12:21 AM  Result Value Ref Range   Lactic Acid, Venous 0.7 0.5 - 1.9 mmol/L  Type and screen     Status: None (Preliminary result)   Collection Time: 08/20/20 12:50 AM  Result Value Ref Range   ABO/RH(D) O POS    Antibody Screen POS    Sample Expiration 08/23/2020,2359    Antibody Identification NO CLINICALLY SIGNIFICANT ANTIBODY IDENTIFIED    DAT, IgG NEG  Unit Number [id number]    Blood Component Type RED CELLS,LR    Unit division 00    Status of Unit ALLOCATED    Donor AG Type NEGATIVE FOR C ANTIGEN    Transfusion Status OK TO TRANSFUSE    Crossmatch Result COMPATIBLE    Unit Number [id number]    Blood Component Type RED CELLS,LR    Unit division 00    Status of Unit ALLOCATED    Transfusion Status OK TO TRANSFUSE    Crossmatch Result      COMPATIBLE Performed at [facility], [place] [address], [place], [place] [address]    Unit Number [id number]    Blood Component Type  RBC LR PHER1    Unit division 00    Status of Unit ALLOCATED    Transfusion Status OK TO TRANSFUSE    Crossmatch Result COMPATIBLE   Prepare RBC (crossmatch)     Status: None   Collection Time: 08/20/20  1:27 AM  Result Value Ref Range   Order Confirmation      ORDER PROCESSED BY BLOOD BANK Performed at [facility], [place] [address] [place], [place] [address]   Reticulocytes     Status: Abnormal   Collection Time: 08/20/20  4:04 AM  Result Value Ref Range   Retic Ct Pct 1.6 0.4 - 3.1 %   RBC. 1.94 (L) 4.22 - 5.81 MIL/uL   Retic Count, Absolute 31.6 19.0 - 186.0 K/uL   Immature Retic Fract 15.0 2.3 - 15.9 %  Lactic acid, plasma     Status: None   Collection Time: 08/20/20  4:04 AM  Result Value Ref Range   Lactic Acid, Venous 1.6 0.5 - 1.9 mmol/L  Comprehensive metabolic panel     Status: Abnormal   Collection Time: 08/20/20  4:04 AM  Result Value Ref Range   Sodium 136 135 - 145 mmol/L   Potassium 5.0 3.5 - 5.1 mmol/L   Chloride 97 (L) 98 - 111 mmol/L   CO2 23 22 - 32 mmol/L   Glucose, Bld 93 70 - 99 mg/dL   BUN 96 (H) 8 - 23 mg/dL   Creatinine, Ser 9.76 (H) 0.61 - 1.24 mg/dL   Calcium 9.5 8.9 - 10.3 mg/dL   Total Protein 6.2 (L) 6.5 - 8.1 g/dL   Albumin 3.4 (L) 3.5 - 5.0 g/dL   AST 18 15 - 41 U/L   ALT 12 0 - 44 U/L   Alkaline Phosphatase 74 38 - 126 U/L   Total Bilirubin 1.1 0.3 - 1.2 mg/dL   GFR, Estimated 5 (L) >60 mL/min   Anion gap 16 (H) 5 - 15  CBC     Status: Abnormal   Collection Time: 08/20/20  4:04 AM  Result Value Ref Range   WBC 4.8 4.0 - 10.5 K/uL   RBC 1.96 (L) 4.22 - 5.81 MIL/uL   Hemoglobin 6.4 (LL) 13.0 - 17.0 g/dL   HCT 19.9 (L) 39.0 - 52.0 %   MCV 101.5 (H) 80.0 - 100.0 fL   MCH 32.7 26.0 - 34.0 pg   MCHC 32.2 30.0 - 36.0 g/dL   RDW 16.1 (H) 11.5 - 15.5 %   Platelets 167 150 - 400 K/uL   nRBC 0.0 0.0 - 0.2 %  Occult blood card to lab, stool     Status: Abnormal   Collection Time: 08/20/20  8:44 AM  Result Value Ref Range   Fecal  Occult Bld POSITIVE (A) NEGATIVE  DG Chest 2 View  Result Date: 08/19/2020 CLINICAL DATA:  Chest pain shortness of breath EXAM: CHEST - 2 VIEW COMPARISON:  Chest radiograph May 04, 2020 FINDINGS: Right subclavian/innominate stent, unchanged. Mild cardiac enlargement. Central vascular congestion. Mild basilar predominant interstitial opacities. The visualized skeletal structures are unremarkable. IMPRESSION: Mild cardiac enlargement with central vascular congestion and basilar predominant interstitial opacities, likely mild edema. Electronically Signed   By: Dahlia Bailiff MD   On: 08/19/2020 20:18    ROS:  As stated above in the HPI otherwise negative.  Blood pressure (!) 155/74, pulse (!) 117, temperature 98.8 F (37.1 C), temperature source Oral, resp. rate 15, height 5\' 4"  (1.626 m), weight 49.1 kg, SpO2 100 %.    PE: Gen: NAD, Alert and Oriented HEENT:  [place]/AT, EOMI Neck: Supple, no LAD, left IJ Lungs: CTA Bilaterally CV: RRR without M/G/R ABD: Soft, NTND, +BS Ext: No C/C/E  Assessment/Plan: 1) Hematochezia. 2) Anemia. 3) ESRD.   His admission HGB was at 7.9 g/dL.  In December last year the HGB was 11-12 g/dL.  He remains hemodynamically stable.  The source of the bleeding is not clear, but the presentation can be consistent with a diverticular bleed.  He was noted to have diverticula with his 2010 colonoscopy with Dr. Henrene Pastor.  With his hemodynamic stability, it is unlikely that he has a rapid upper GI bleed.  Plan: 1) CT angio. 2) Agree with blood transfusion. 3) Pending the results of the CT angio an EGD and/or colonoscopy can be performed. Sheyenne Konz D 08/20/2020, 10:23 AM

## 2020-08-20 NOTE — Procedures (Signed)
Interventional Radiology Procedure:   Indications: Poor venous access with ESRD and GI bleeding  Procedure: Placement of left jugular central line.   Central venograms  Findings: Bilateral IJ veins are patent by [place].  Wire would not advance from right IJ and venogram confirmed an occlusion in lower right IJ at the venous stent.  Left jugular access and venogram demonstrates a high grade stenosis at left innominate and SVC due to the stent. Able to cannulate the SVC from left IJ approach and placed a dual lumen PowerPICC over a wire.  Catheter length - 23 cm and tip at SVC/RA junction.   Complications: None     EBL:  10 ml  Plan: Central line is ready to use.    Ziana Heyliger R. Anselm Pancoast, MD  Pager: [phone]

## 2020-08-20 NOTE — Progress Notes (Signed)
Informed of patient's clinical situation. Eval for ST however no access at the present moment. Per charting, he wants to wait for nephrology's input to discuss options. Not sure what options we can offer him since he needs central access at the moment given lack of IV access. Assuming central access is temporary and since the benefits of access outweigh risks from a vascular standpoint (at this time), would be okay for it from a nephrology perspective. Unfortunately, utilizing his dialysis access would not be an option.  In regards to his tachy, I wonder if his anemia is a contributing factor since he was not able to rinse his blood back on Wed per H&P. Hgb 12.1 05/07/20 and now 7.9 08/19/20. Certainly PE is a differential diagnosis given his PMH of PE, possible can do a V/Q scan to start with? Nonetheless, patient needs access of some sorts especially when it comes to potential therapeutic options (I.e. blood transfusion, anticoagulation for PE, etc) therefore I strongly recommend that he has a central line placed.  Tentatively planning for HD today. Full consult to follow.  Gean Quint, MD [facility]

## 2020-08-20 NOTE — Progress Notes (Signed)
Asked to see patient for central venous access  Patient stated he has been stuck so many times and is refusing a line at present  He will wait until he sees nephrology to talk about his options

## 2020-08-20 NOTE — Consult Note (Signed)
[facility] Renal Consultation Note    Indication for Consultation:  Management of ESRD/hemodialysis; anemia, hypertension/volume and secondary hyperparathyroidism  HPI: Logan Martin is a 65 y.o. 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 On HD  > 20 years. Home HD since 2009. PMH also significant for HTN, Hep C, hearing impairment, PVD, Hx bladder ca, Hx PE. He is admitted for evaluation of tachycardia.  He presented to the ED last evening with cp, sob, fatigue. HR 110s-130s since arrival.Sinus tach on EKG. Mild pulm edema on CXR. Labs this am: Na 136, K 5.0. CO2 23, BUN 96 Cr 9.76, Hgb 6.4. Hgb trending down 12.8 on  06/30/20 >--7.9 >6.4.     Usual dialysis Nx Stage MTTF  Dialysis via RUE AVF. He has been out of town for the past month and his last dialysis was Wednesday in [place]. Sounds like the circuit clotted and he did not get his blood rinsed back.   Seen and examined at bedside. He endorses worsening fatigue and weakness over the past week. He also endorses dark stools.  This am he feels much better than when he came in. Still weak, but denies f,c, cp, sob at rest, abd pain, n/v,d. Unfortunately,  there has been difficulty obtaining IV access.  Plan for IR to help place access.   Past Medical History:  Diagnosis Date  . Alcohol abuse    quit date in 1995  . Anemia   . Cancer ([place])    bladder  . Chronic low back pain   . Cocaine abuse in remission ([place])    Quit date in 1995  . ESRD (end stage renal disease) ([place])    Home Dialysis  . 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 Started HD in [place], [place].  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  On NxStage home hemo since 2010. Using RUA AVF.    [name] Hearing aid worn    B/L  . Hepatitis C    s/p treatment with Mavyret  . History of blood transfusion 2007 X 1  . History of kidney stones   . Hx of constipation   . Hypertension   . Left inguinal hernia    . Peripheral vascular disease ([place])   . Pulmonary embolism [facility]) Feb 2012   Treated with coumadin x 1 year  . Wears glasses    Past Surgical History:  Procedure Laterality Date  . A/V FISTULAGRAM N/A 11/22/2017   Procedure: A/V Fistulagram;  Surgeon: Serafina Mitchell, MD;  Location: [facility];  Service: Cardiovascular;  Laterality: N/A;  rt. arm   . ABDOMINAL HERNIA REPAIR  2000    x 2  . AV FISTULA PLACEMENT Right 2007   upper arm  . AV FISTULA REPAIR Right ~ 03/2016 & 04/2016  . BLADDER AUGMENTATION  1975  . COLONOSCOPY    . CORONARY ANGIOPLASTY WITH STENT PLACEMENT  1999  . CYSTOSCOPY W/ URETERAL STENT PLACEMENT N/A 10/13/2017   Procedure: CYSTOSCOPY WITH IRRIGATION OF BLADDER PLACEMENT OF FOLEY;  Surgeon: Ceasar Mons, MD;  Location: [facility];  Service: Urology;  Laterality: N/A;  . CYSTOSCOPY WITH FULGERATION N/A 03/27/2018   Procedure: [place] AND MUCOUS EVACUATION, BLADDER BIOPSY;  Surgeon: Franchot Gallo, MD;  Location: [facility];  Service: Urology;  Laterality: N/A;  . ESOPHAGOGASTRODUODENOSCOPY    . EXTRACORPOREAL SHOCK WAVE LITHOTRIPSY    . FISTULA SUPERFICIALIZATION Right 12/01/2019  Procedure: PLICATION OF ANEURYSM OF ARTERIOVENOUS FISTULA;  Surgeon: Angelia Mould, MD;  Location: [facility];  Service: Vascular;  Laterality: Right;  . HERNIA REPAIR    . INGUINAL HERNIA REPAIR Right 1975  . INGUINAL HERNIA REPAIR Left 03/04/2019   Procedure: LEFT OPEN INGUINAL HERNIA REPAIR WITH MESH;  Surgeon: Kinsinger, Arta Bruce, MD;  Location: [facility];  Service: General;  Laterality: Left;  GENERAL AND LMA  . INSERTION OF DIALYSIS CATHETER Left 03/15/2020   Procedure: INSERTION OF Left Internal Jugular DIALYSIS CATHETER;  Surgeon: [name], MD;  Location: [facility];  Service: Vascular;  Laterality: Left;  . IR FLUORO GUIDE CV LINE LEFT  03/11/2020  . IR US GUIDE VASC ACCESS LEFT  03/11/2020  . LEFT HEART CATH AND CORONARY ANGIOGRAPHY N/A  05/06/2020   Procedure: LEFT HEART CATH AND CORONARY ANGIOGRAPHY;  Surgeon: Nigel Mormon, MD;  Location: [facility];  Service: Cardiovascular;  Laterality: N/A;  . PERIPHERAL VASCULAR BALLOON ANGIOPLASTY  11/22/2017   Procedure: PERIPHERAL VASCULAR BALLOON ANGIOPLASTY;  Surgeon: Serafina Mitchell, MD;  Location: [facility];  Service: Cardiovascular;;  rt. arm fistula  . PERIPHERAL VASCULAR CATHETERIZATION N/A 06/22/2016   Procedure: Left Arm Venography;  Surgeon: Elam Dutch, MD;  Location: [facility];  Service: Cardiovascular;  Laterality: N/A;  . PERIPHERAL VASCULAR CATHETERIZATION N/A 06/22/2016   Procedure: A/V Fistulagram - Right Arm;  Surgeon: Elam Dutch, MD;  Location: [facility];  Service: Cardiovascular;  Laterality: N/A;  . PERIPHERAL VASCULAR CATHETERIZATION Right 06/22/2016   Procedure: Peripheral Vascular Balloon Angioplasty;  Surgeon: Elam Dutch, MD;  Location: [facility];  Service: Cardiovascular;  Laterality: Right;  arm fistula  . REVISON OF ARTERIOVENOUS FISTULA Right 03/15/2020   Procedure: RIGHT ARM ARTERIOVENOUS FISTULA REVISON;  Surgeon: [name], MD;  Location: [facility];  Service: Vascular;  Laterality: Right;   Family History  Problem Relation Age of Onset  . Cancer Mother   . Diabetes Mother   . Hypertension Mother   . Deep vein thrombosis Daughter    Social History:  reports that he quit smoking about 27 years ago. His smoking use included cigarettes. He has a 5.00 pack-year smoking history. He has never used smokeless tobacco. He reports previous alcohol use. He reports previous drug use. Drugs: Marijuana and Cocaine. Allergies  Allergen Reactions  . Heparin Rash    Pork products   . Pork-Derived Products Rash  . Sulfa Antibiotics Rash   Prior to Admission medications   Medication Sig Start Date End Date Taking? Authorizing Provider  amLODipine (NORVASC) 10 MG tablet Take 1 tablet (10 mg total) by  mouth daily. 03/18/20  Yes Aline August, MD  aspirin EC 81 MG tablet Take 81 mg by mouth at bedtime.    Yes [provider]  atorvastatin (LIPITOR) 40 MG tablet Take 40 mg by mouth daily. 01/20/20  Yes [provider]  AURYXIA 1 GM 210 MG(Fe) tablet Take 420 mg by mouth 3 (three) times daily. 02/12/20  Yes [provider]  hydrALAZINE (APRESOLINE) 25 MG tablet Take 1 tablet (25 mg total) by mouth 3 (three) times daily. 05/07/20  Yes Bonnell Public, MD  hydrOXYzine (VISTARIL) 50 MG capsule Take 50 mg by mouth daily at 6 (six) AM. 08/17/20  Yes [provider]  isosorbide mononitrate (IMDUR) 30 MG 24 hr tablet Take 30 mg by mouth daily.  08/15/18 08/19/20 Yes [provider]  labetalol (NORMODYNE) 100 MG tablet Take 100 mg by mouth daily. 08/03/20  Yes [provider]  lidocaine-prilocaine (EMLA) cream Apply 1 application topically See admin instructions. Apply small amount to dialysis port (AVF) one hour before dialysis. Cover with occlusive dressing (saran wrap) 11/14/17  Yes [provider]  LOKELMA 5 g packet Take 1 packet by mouth daily. 08/03/20  Yes [provider]  methocarbamol (ROBAXIN) 750 MG tablet Take 750 mg by mouth every 8 (eight) hours as needed for muscle spasms. 08/12/20  Yes [provider]  oxyCODONE (OXY IR/ROXICODONE) 5 MG immediate release tablet Take 5 mg by mouth 2 (two) times daily as needed for moderate pain. 04/29/20  Yes [provider]  sucralfate (CARAFATE) 1 g tablet Take 1 g by mouth daily. 08/12/20  Yes [provider]  traZODone (DESYREL) 150 MG tablet Take 150 mg by mouth daily as needed. 08/17/20  Yes [provider]  Vitamin D, Ergocalciferol, (DRISDOL) 1.25 MG (50000 UNIT) CAPS capsule Take 50,000 Units by mouth once a week. 08/03/20  Yes [provider]   Current Facility-Administered Medications  Medication Dose Route Frequency Provider Last Rate Last  Admin  . 0.9 %  sodium chloride infusion (Manually program via Guardrails IV Fluids)   Intravenous Once Doran Heater, DO      . acetaminophen (TYLENOL) tablet 650 mg  650 mg Oral Q6H PRN Doran Heater, DO       Or  . acetaminophen (TYLENOL) suppository 650 mg  650 mg Rectal Q6H PRN MacNeil, Richard G, DO      . amoxicillin-clavulanate (AUGMENTIN) 500-125 MG per tablet 500 mg  1 tablet Oral Daily MacNeil, Richard G, DO   500 mg at 08/20/20 0357  . atorvastatin (LIPITOR) tablet 40 mg  40 mg Oral Daily MacNeil, Richard G, DO      . Chlorhexidine Gluconate Cloth 2 % PADS 6 each  6 each Topical Q0600 Gean Quint, MD   6 each at 08/20/20 [phone]  . ferric citrate (AURYXIA) tablet 420 mg  420 mg Oral TID Doran Heater, DO      . hydrOXYzine (ATARAX/VISTARIL) tablet 50 mg  50 mg Oral Q0600 MacNeil, Richard G, DO      . labetalol (NORMODYNE) tablet 100 mg  100 mg Oral Q8H MacNeil, Richard G, DO      . levalbuterol (XOPENEX) nebulizer solution 0.63 mg  0.63 mg Nebulization Q6H PRN MacNeil, Richard G, DO      . ondansetron (ZOFRAN) tablet 4 mg  4 mg Oral Q6H PRN MacNeil, Richard G, DO       Or  . ondansetron (ZOFRAN) injection 4 mg  4 mg Intravenous Q6H PRN MacNeil, Richard G, DO      . pantoprazole (PROTONIX) 80 mg in sodium chloride 0.9 % 100 mL (0.8 mg/mL) infusion  8 mg/hr Intravenous Continuous Elgergawy, Silver Huguenin, MD      . pantoprazole (PROTONIX) 80 mg in sodium chloride 0.9 % 100 mL IVPB  80 mg Intravenous Once Elgergawy, Silver Huguenin, MD      . Derrill Memo ON 08/23/2020] pantoprazole (PROTONIX) injection 40 mg  40 mg Intravenous Q12H Elgergawy, Dawood S, MD      . sodium zirconium cyclosilicate (LOKELMA) packet 10 g  10 g Oral Once MacNeil, Richard G, DO      . sodium zirconium cyclosilicate (LOKELMA) packet 5 g  5 g Oral Daily MacNeil, Richard G, DO         ROS:  As per HPI otherwise negative.  Physical Exam: Vitals:   08/20/20 0700 08/20/20 0747 08/20/20 0800 08/20/20 0900  BP: 120/65  130/67 (!) 145/74 (!) 155/74  Pulse:  (!) 117    Resp: 19 18 (!) 24 15  Temp:      TempSrc:      SpO2:  100%    Weight:      Height:         General: Non-toxic appearing, nad  Head: NCAT sclera not icteric MMM Neck: Supple. No JVD appreciated  Lungs: CTA bilaterally without wheezes, rales, or rhonchi. Breathing is unlabored. Heart: Tachy, regular s1, s2  Abdomen: soft, non-tender  Lower extremities:without edema or ischemic changes, no open wounds  Neuro: A & O  X 3. Moves all extremities spontaneously. Psych:  Responds to questions appropriately with a normal affect. Dialysis Access: RUE AVF +bruit   Labs: Basic Metabolic Panel: Recent Labs  Lab 08/19/20 1855 08/20/20 0404  NA 138 136  K 5.2* 5.0  CL 96* 97*  CO2 29 23  GLUCOSE 90 93  BUN 89* 96*  CREATININE 9.27* 9.76*  CALCIUM 9.2 9.5   Liver Function Tests: Recent Labs  Lab 08/20/20 0404  AST 18  ALT 12  ALKPHOS 74  BILITOT 1.1  PROT 6.2*  ALBUMIN 3.4*   No results for input(s): LIPASE, AMYLASE in the last 168 hours. No results for input(s): AMMONIA in the last 168 hours. CBC: Recent Labs  Lab 08/19/20 2125 08/20/20 0404  WBC 5.3 4.8  HGB 7.9* 6.4*  HCT 25.2* 19.9*  MCV 103.7* 101.5*  PLT 124* 167   Cardiac Enzymes: No results for input(s): CKTOTAL, CKMB, CKMBINDEX, TROPONINI in the last 168 hours. CBG: No results for input(s): GLUCAP in the last 168 hours. Iron Studies:  Recent Labs    08/20/20 0021  IRON 61  TIBC 209*  FERRITIN 1,386*   Studies/Results: DG Chest 2 View  Result Date: 08/19/2020 CLINICAL DATA:  Chest pain shortness of breath EXAM: CHEST - 2 VIEW COMPARISON:  Chest radiograph May 04, 2020 FINDINGS: Right subclavian/innominate stent, unchanged. Mild cardiac enlargement. Central vascular congestion. Mild basilar predominant interstitial opacities. The visualized skeletal structures are unremarkable. IMPRESSION: Mild cardiac enlargement with central vascular congestion and  basilar predominant interstitial opacities, likely mild edema. Electronically Signed   By: Dahlia Bailiff MD   On: 08/19/2020 20:18    Dialysis Orders:  Nx Stage MTTF EDW 46.5kg   Assessment/Plan: 1. Tachycardia. Hx of PE. W/u per primary -plan for CTA once access obtained. Anticoagulation per pharmacy  2. ESRD -  Home HD. Last dialysis 3/23. For dialysis today off schedule. K+ 5.0 On Lokelma daily as outpatient.  3. Anemia --  Hgb 7.9>6.4. Tsat 29% Endorses dark stools but is on Fe based binder. Check FOBT  .For transfusion once access in place.  Resume ESA with HD today. Follow trends.  Try to keep Hgb > 8.  4. Hypertension/volume  - BP controlled. Some volume on CXR. UF to dry weight as tolerated  5. Metabolic bone disease -  Continue Auryxia, calcitriol, Sensipar when taking po.  6. Oral abscess - noted on admit. Per primary    Lynnda Child PA-C [facility] 08/20/2020, 9:50 AM

## 2020-08-20 NOTE — Plan of Care (Signed)
  Unfortunately patient lost IV access prior to getting antibiotics, blood transfusion, and starting anticoagulation for concern for PE.  Multiple attempts to reestablish IV access were attempted however unsuccessful.  I contacted critical care team to help with obtaining access and even evaluated patient bedside.  However patient adamantly refusing any further attempts.  He is requesting to go over further options with nephrology and would like to get the medications while he is on hemodialysis and using his fistula.  I discussed with him the importance of starting anticoagulation right away, receiving blood, obtaining CTA of the chest, and appropriate IV antibiotics.  Despite my recommendations and discussing with him the possible complications of delaying treatment including death, respiratory failure, cardiac failure, spread of infection, the patient refused.  He says he understands the complications of waiting until evaluated by nephrology.  I contacted nephrology, Dr. Candiss Norse, to explain the current situation and he ensure a member of the nephrology team evaluate patient early in the morning with tentative plans to perform hemodialysis once staffing comes available.  Additionally will talk to patient about the possibilities for obtaining access.

## 2020-08-20 NOTE — ED Provider Notes (Signed)
[facility] EMERGENCY DEPARTMENT Provider Note   CSN: [id number] Arrival date & time: 08/19/20  1838     History Chief Complaint  Patient presents with  . Chest Pain    Logan Martin is a 65 y.o. male.  HPI     65 year old with history of ESRD on HD, CAD, CHF prior history of PE not on any anticoagulation comes in with chief complaint of chest pain and weakness.  Patient reports that he started having some chest discomfort and shortness of breath earlier today.  He missed his hemodialysis because of weakness.  The weakness is generalized and began today.  Yesterday patient had no complaints.  With the chest pain he denies any cough.  Shortness of breath is present constantly.  Patient denies any nausea, vomiting, fevers, chills.  Patient does indicate that he had a recent flight that he took -totaling about 3-1/2 or 4 hours.  Review of system is positive for oral swelling, toothache.  Past Medical History:  Diagnosis Date  . Alcohol abuse    quit date in 1995  . Anemia   . Cancer ([place])    bladder  . Chronic low back pain   . Cocaine abuse in remission ([place])    Quit date in 1995  . ESRD (end stage renal disease) ([place])    Home Dialysis  . 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 Started HD in [place], [place].  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  On NxStage home hemo since 2010. Using RUA AVF.    [name] Hearing aid worn    B/L  . Hepatitis C    s/p treatment with Mavyret  . History of blood transfusion 2007 X 1  . History of kidney stones   . Hx of constipation   . Hypertension   . Left inguinal hernia   . Peripheral vascular disease ([place])   . Pulmonary embolism [facility]) Feb 2012   Treated with coumadin x 1 year  . Wears glasses     Patient Active Problem List   Diagnosis Date Noted  . Tachycardia 08/20/2020  . Non-ST elevation (NSTEMI) myocardial infarction ([place])   . Prolonged QT interval 05/05/2020   . Allergy, unspecified, sequela 03/18/2020  . Pain, unspecified 03/18/2020  . Personal history of anaphylaxis 03/18/2020  . Cellulitis 03/10/2020  . Chronic low back pain   . Peripheral vascular disease ([place])   . Other mechanical complication of surgically created arteriovenous fistula, initial encounter ([place]) 03/09/2020  . Unspecified open wound of right upper arm, subsequent encounter 03/09/2020  . Hypercalcemia 01/20/2020  . Other disorders of phosphorus metabolism 01/01/2020  . Severe mitral regurgitation 12/22/2018  . Hypercholesterolemia 12/22/2018  . History of bladder cancer 08/14/2018  . Personal history of pulmonary embolism 08/14/2018  . Red blood cell antibody positive, compatible PRBC difficult to obtain 08/14/2018  . Malignant neoplasm of overlapping sites of bladder ([place]) 07/20/2018  . Volume overload 06/18/2018  . Fluid overload 06/17/2018  . Elevated troponin 06/17/2018  . Hematuria 03/27/2018  . Hypokalemia 03/27/2018  . Thrombocytopenia ([place]) 03/27/2018  . Anemia in ESRD (end-stage renal disease) ([place]) 03/27/2018  . Bladder tumor 03/27/2018  . Encounter for adequacy testing for peritoneal dialysis ([place]) 01/03/2018  . 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 Compression of vein 11/12/2017  . Hypertension with heart disease 10/14/2017  . Pyelocystitis 10/13/2017  . 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 Cramp and spasm 02/20/2017  . Acidosis  02/18/2017  . Dependence on renal dialysis ([place]) 02/18/2017  . Hypertensive heart and chronic kidney disease with heart failure and with stage 5 chronic kidney disease, or end stage renal disease ([place]) 02/18/2017  . Iron deficiency anemia, unspecified 02/18/2017  . Moderate protein-calorie malnutrition ([place]) 02/18/2017  . Other dietary vitamin B12 deficiency anemia 02/18/2017  . Other disorders of electrolyte and fluid balance, not elsewhere classified 02/18/2017  . Other disorders resulting from impaired renal tubular function  02/18/2017  . Personal history of nicotine dependence 02/18/2017  . Cardiomegaly 01/21/2017  . Congestive heart failure ([place]) 01/21/2017  . Pancytopenia ([place]) 06/14/2016  . Left arm pain 06/14/2016  . 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 Mechanical complication of other vascular device, implant, and graft 08/10/2013  . End stage renal disease ([place]) 08/10/2013  . ESRD (end stage renal disease) ([place]) 06/07/2013  . Nausea and vomiting 06/07/2013  . SOB (shortness of breath) 05/10/2013  . Chest discomfort 11/27/2012  . Shortness of breath 11/27/2012  . Chronic hepatitis C without hepatic coma ([place]) 02/04/2012  . Secondary hyperparathyroidism (of renal origin) 02/04/2012  . Anemia associated with chronic renal failure 02/04/2012  . CAD (coronary atherosclerotic disease) 02/04/2012  . Pulmonary embolism ([place]) 07/20/2010  . Chronic pain syndrome 06/08/2010  . Renal failure 04/26/2010  . Atherosclerosis of native coronary artery of native heart without angina pectoris 02/23/2009  . HEMORRHOIDS, INTERNAL 02/23/2009  . GERD 02/23/2009  . ESRD on dialysis ([place]) 02/16/2009  . Allergic rhinitis 05/06/2008    Past Surgical History:  Procedure Laterality Date  . A/V FISTULAGRAM N/A 11/22/2017   Procedure: A/V Fistulagram;  Surgeon: Serafina Mitchell, MD;  Location: [place] [facility];  Service: Cardiovascular;  Laterality: N/A;  rt. arm   . ABDOMINAL HERNIA REPAIR  2000    x 2  . AV FISTULA PLACEMENT Right 2007   upper arm  . AV FISTULA REPAIR Right ~ 03/2016 & 04/2016  . BLADDER AUGMENTATION  1975  . COLONOSCOPY    . CORONARY ANGIOPLASTY WITH STENT PLACEMENT  1999  . CYSTOSCOPY W/ URETERAL STENT PLACEMENT N/A 10/13/2017   Procedure: CYSTOSCOPY WITH IRRIGATION OF BLADDER PLACEMENT OF FOLEY;  Surgeon: Ceasar Mons, MD;  Location: [place];  Service: Urology;  Laterality: N/A;  . CYSTOSCOPY WITH FULGERATION N/A 03/27/2018   Procedure: [place] AND  MUCOUS EVACUATION, BLADDER BIOPSY;  Surgeon: Franchot Gallo, MD;  Location: [facility];  Service: Urology;  Laterality: N/A;  . ESOPHAGOGASTRODUODENOSCOPY    . EXTRACORPOREAL SHOCK WAVE LITHOTRIPSY    . FISTULA SUPERFICIALIZATION Right 08/27/7060   Procedure: PLICATION OF ANEURYSM OF ARTERIOVENOUS FISTULA;  Surgeon: Angelia Mould, MD;  Location: [facility];  Service: Vascular;  Laterality: Right;  . HERNIA REPAIR    . INGUINAL HERNIA REPAIR Right 1975  . INGUINAL HERNIA REPAIR Left 03/04/2019   Procedure: LEFT OPEN INGUINAL HERNIA REPAIR WITH MESH;  Surgeon: Kinsinger, Arta Bruce, MD;  Location: [place];  Service: General;  Laterality: Left;  GENERAL AND LMA  . INSERTION OF DIALYSIS CATHETER Left 03/15/2020   Procedure: INSERTION OF Left Internal Jugular DIALYSIS CATHETER;  Surgeon: [name], MD;  Location: [place];  Service: Vascular;  Laterality: Left;  . IR FLUORO GUIDE CV LINE LEFT  03/11/2020  . IR US GUIDE VASC ACCESS LEFT  03/11/2020  . LEFT HEART CATH AND CORONARY ANGIOGRAPHY N/A 05/06/2020   Procedure: LEFT HEART CATH AND CORONARY ANGIOGRAPHY;  Surgeon: Nigel Mormon, MD;  Location:  [facility] [place] INVASIVE CV LAB;  Service: Cardiovascular;  Laterality: N/A;  . PERIPHERAL VASCULAR BALLOON ANGIOPLASTY  11/22/2017   Procedure: PERIPHERAL VASCULAR BALLOON ANGIOPLASTY;  Surgeon: Serafina Mitchell, MD;  Location: [facility] [facility];  Service: Cardiovascular;;  rt. arm fistula  . PERIPHERAL VASCULAR CATHETERIZATION N/A 06/22/2016   Procedure: Left Arm Venography;  Surgeon: Elam Dutch, MD;  Location: [place] [facility];  Service: Cardiovascular;  Laterality: N/A;  . PERIPHERAL VASCULAR CATHETERIZATION N/A 06/22/2016   Procedure: A/V Fistulagram - Right Arm;  Surgeon: Elam Dutch, MD;  Location: [place] [facility];  Service: Cardiovascular;  Laterality: N/A;  . PERIPHERAL VASCULAR CATHETERIZATION Right 06/22/2016   Procedure: Peripheral Vascular Balloon Angioplasty;  Surgeon: Elam Dutch, MD;  Location: [place] [facility];  Service: Cardiovascular;  Laterality: Right;  arm fistula  . REVISON OF ARTERIOVENOUS FISTULA Right 03/15/2020   Procedure: RIGHT ARM ARTERIOVENOUS FISTULA REVISON;  Surgeon: [name], MD;  Location: [facility];  Service: Vascular;  Laterality: Right;       Family History  Problem Relation Age of Onset  . Cancer Mother   . Diabetes Mother   . Hypertension Mother   . Deep vein thrombosis Daughter     Social History   Tobacco Use  . Smoking status: Former Smoker    Packs/day: 1.00    Years: 5.00    Pack years: 5.00    Types: Cigarettes    Quit date: 05/28/1993    Years since quitting: 27.2  . Smokeless tobacco: Never Used  Vaping Use  . Vaping Use: Never used  Substance Use Topics  . Alcohol use: Not Currently    Comment:  "nothing since 1995"  . Drug use: Not Currently    Types: Marijuana, Cocaine    Comment: denies 03/02/19    Home Medications Prior to Admission medications   Medication Sig Start Date End Date Taking? Authorizing Provider  amLODipine (NORVASC) 10 MG tablet Take 1 tablet (10 mg total) by mouth daily. 03/18/20  Yes Aline August, MD  aspirin EC 81 MG tablet Take 81 mg by mouth at bedtime.    Yes [provider]  atorvastatin (LIPITOR) 40 MG tablet Take 40 mg by mouth daily. 01/20/20  Yes [provider]  AURYXIA 1 GM 210 MG(Fe) tablet Take 420 mg by mouth 3 (three) times daily. 02/12/20  Yes [provider]  hydrALAZINE (APRESOLINE) 25 MG tablet Take 1 tablet (25 mg total) by mouth 3 (three) times daily. 05/07/20  Yes Bonnell Public, MD  hydrOXYzine (VISTARIL) 50 MG capsule Take 50 mg by mouth daily at 6 (six) AM. 08/17/20  Yes [provider]  isosorbide mononitrate (IMDUR) 30 MG 24 hr tablet Take 30 mg by mouth daily.  08/15/18 08/19/20 Yes [provider]  labetalol (NORMODYNE) 100 MG tablet Take 100 mg by mouth daily. 08/03/20  Yes [provider]   lidocaine-prilocaine (EMLA) cream Apply 1 application topically See admin instructions. Apply small amount to dialysis port (AVF) one hour before dialysis. Cover with occlusive dressing (saran wrap) 11/14/17  Yes [provider]  LOKELMA 5 g packet Take 1 packet by mouth daily. 08/03/20  Yes [provider]  methocarbamol (ROBAXIN) 750 MG tablet Take 750 mg by mouth every 8 (eight) hours as needed for muscle spasms. 08/12/20  Yes [provider]  oxyCODONE (OXY IR/ROXICODONE) 5 MG immediate release tablet Take 5 mg by mouth 2 (two) times daily as needed for moderate  pain. 04/29/20  Yes [provider]  sucralfate (CARAFATE) 1 g tablet Take 1 g by mouth daily. 08/12/20  Yes [provider]  traZODone (DESYREL) 150 MG tablet Take 150 mg by mouth daily as needed. 08/17/20  Yes [provider]  Vitamin D, Ergocalciferol, (DRISDOL) 1.25 MG (50000 UNIT) CAPS capsule Take 50,000 Units by mouth once a week. 08/03/20  Yes [provider]  metoprolol succinate (TOPROL-XL) 50 MG 24 hr tablet Take 1 tablet (50 mg total) by mouth daily. Take with or immediately following a meal. Patient not taking: No sig reported 05/07/20   Dana Allan I, MD    Allergies    Heparin, Pork-derived products, and Sulfa antibiotics  Review of Systems   Review of Systems  Constitutional: Positive for activity change.  Respiratory: Positive for shortness of breath.   Cardiovascular: Positive for chest pain.  Gastrointestinal: Negative for nausea and vomiting.  Allergic/Immunologic: Negative for immunocompromised state.  Hematological: Does not bruise/bleed easily.  All other systems reviewed and are negative.   Physical Exam Updated Vital Signs BP 120/64   Pulse (!) 130   Temp 99.3 F (37.4 C) (Oral)   Resp (!) 21   Ht 5\' 4"  (1.626 m)   Wt 45.4 kg   SpO2 100%   BMI 17.16 kg/m   Physical Exam Vitals and nursing note reviewed.  Constitutional:       Appearance: He is well-developed.  HENT:     Head: Normocephalic and atraumatic.  Eyes:     Conjunctiva/sclera: Conjunctivae normal.     Pupils: Pupils are equal, round, and reactive to light.  Neck:     Comments: Gingival swelling, tooth # 7, with fluctance Cardiovascular:     Rate and Rhythm: Tachycardia present.  Pulmonary:     Effort: Pulmonary effort is normal.     Breath sounds: Examination of the right-lower field reveals rales. Examination of the left-lower field reveals rales. Rales present.  Abdominal:     General: Bowel sounds are normal. There is no distension.     Palpations: Abdomen is soft. There is no mass.     Tenderness: There is no abdominal tenderness. There is no guarding or rebound.  Musculoskeletal:        General: No deformity.     Cervical back: Normal range of motion and neck supple.  Skin:    General: Skin is warm.  Neurological:     Mental Status: He is alert and oriented to person, place, and time.     ED Results / Procedures / Treatments   Labs (all labs ordered are listed, but only abnormal results are displayed) Labs Reviewed  BASIC METABOLIC PANEL - Abnormal; Notable for the following components:      Result Value   Potassium 5.2 (*)    Chloride 96 (*)    BUN 89 (*)    Creatinine, Ser 9.27 (*)    GFR, Estimated 6 (*)    All other components within normal limits  CBC - Abnormal; Notable for the following components:   RBC 2.43 (*)    Hemoglobin 7.9 (*)    HCT 25.2 (*)    MCV 103.7 (*)    RDW 15.9 (*)    Platelets 124 (*)    All other components within normal limits  TROPONIN I (HIGH SENSITIVITY) - Abnormal; Notable for the following components:   Troponin I (High Sensitivity) 59 (*)    All other components within normal limits  TROPONIN I (HIGH  SENSITIVITY) - Abnormal; Notable for the following components:   Troponin I (High Sensitivity) 60 (*)    All other components within normal limits  SARS CORONAVIRUS 2 (TAT 6-24 HRS)   CULTURE, BLOOD (ROUTINE X 2)  CULTURE, BLOOD (ROUTINE X 2)  RESP PANEL BY RT-PCR (FLU A&B, COVID) ARPGX2  URINE CULTURE  PROTIME-INR  MAGNESIUM  VITAMIN B12  FOLATE  IRON AND TIBC  FERRITIN  RETICULOCYTES  LACTIC ACID, PLASMA  LACTIC ACID, PLASMA  URINALYSIS, ROUTINE W REFLEX MICROSCOPIC  POC OCCULT BLOOD, ED  TYPE AND SCREEN    EKG EKG Interpretation  Date/Time:  Friday August 19 2020 23:29:20 EDT Ventricular Rate:  130 PR Interval:    QRS Duration: 135 QT Interval:  399 QTC Calculation: 587 R Axis:   -99 Text Interpretation: Sinus or ectopic atrial tachycardia RBBB and LAFB ST elevation, consider inferior injury When compared with ECG of EARLIER SAME DATE No significant change was found Confirmed by Delora Fuel ([id number]) on 08/20/2020 12:20:48 AM   Radiology DG Chest 2 View  Result Date: 08/19/2020 CLINICAL DATA:  Chest pain shortness of breath EXAM: CHEST - 2 VIEW COMPARISON:  Chest radiograph May 04, 2020 FINDINGS: Right subclavian/innominate stent, unchanged. Mild cardiac enlargement. Central vascular congestion. Mild basilar predominant interstitial opacities. The visualized skeletal structures are unremarkable. IMPRESSION: Mild cardiac enlargement with central vascular congestion and basilar predominant interstitial opacities, likely mild edema. Electronically Signed   By: Dahlia Bailiff MD   On: 08/19/2020 20:18    Procedures Angiocath insertion  Date/Time: 08/20/2020 12:43 AM Performed by: Varney Biles, MD Authorized by: Varney Biles, MD  Consent: Verbal consent obtained. Risks and benefits: risks, benefits and alternatives were discussed Consent given by: patient Patient understanding: patient states understanding of the procedure being performed Patient identity confirmed: arm band Time out: Immediately prior to procedure a "time out" was called to verify the correct patient, procedure, equipment, support staff and site/side marked as  required. Preparation: Patient was prepped and draped in the usual sterile fashion. Local anesthesia used: no  Anesthesia: Local anesthesia used: no  Sedation: Patient sedated: no  Patient tolerance: patient tolerated the procedure well with no immediate complications Comments: Long 20-gauge Angiocath was placed in the left IJ  .Cardioversion  Date/Time: 08/20/2020 12:44 AM Performed by: Varney Biles, MD Authorized by: Varney Biles, MD   Consent:    Consent obtained:  Verbal   Consent given by:  Patient   Risks discussed:  Induced arrhythmia and pain   Alternatives discussed:  Rate-control medication Universal protocol:    Procedure explained and questions answered to patient or proxy's satisfaction: yes     Patient identity confirmed:  Arm band Pre-procedure details:    Cardioversion basis:  Elective   Electrode placement:  Anterior-posterior Patient sedated: No Post-procedure details:    Patient status:  Awake   Patient tolerance of procedure:  Tolerated well, no immediate complications Comments:     Chemical cardioversion was attempted with 6 mg of adenosine   .Critical Care Performed by: Varney Biles, MD Authorized by: Varney Biles, MD   Critical care provider statement:    Critical care time (minutes):  45   Critical care was necessary to treat or prevent imminent or life-threatening deterioration of the following conditions:  Circulatory failure   Critical care was time spent personally by me on the following activities:  Discussions with consultants, evaluation of patient's response to treatment, examination of patient, ordering and performing treatments and interventions, ordering and review of  laboratory studies, ordering and review of radiographic studies, pulse oximetry, re-evaluation of patient's condition, obtaining history from patient or surrogate and review of old charts     Medications Ordered in ED Medications  sodium zirconium  cyclosilicate (LOKELMA) packet 10 g (has no administration in time range)  lactated ringers infusion (has no administration in time range)  Ampicillin-Sulbactam (UNASYN) 3 g in sodium chloride 0.9 % 100 mL IVPB (has no administration in time range)  adenosine (ADENOCARD) 6 MG/2ML injection 6 mg (has no administration in time range)  adenosine (ADENOCARD) 6 MG/2ML injection (6 mg Intravenous Given 08/20/20 0005)    ED Course  I have reviewed the triage vital signs and the nursing notes.  Pertinent labs & imaging results that were available during my care of the patient were reviewed by me and considered in my medical decision making (see chart for details).    MDM Rules/Calculators/A&P                          65 year old with history of ESRD on HD, CAD, CHF, prior history of PE comes in a chief complaint of chest pain, shortness of breath, weakness.  -He is noted to be tachycardic. Initially we considered a flutter/fib, PSVT, sinus tachycardia in the differential.  Given that patient is high risk for infection and has other constitutional symptoms, we proceeded with general work-up to make sure the underlying cause was not infectious or metabolic in nature.  -Infection work-up is negative besides patient having a gingival abscess.  Patient has refused drainage of it.  He prefers getting antibiotics and he will see his dentist on Monday.  He was made aware that the preferred treatment of gingival abscess is drainage and not antibiotics.  He will inform us if his swelling is getting worse.  Currently is not having any airway issues and there is no evidence of deep space infection of his neck.  No CT ordered.  IV Unasyn given.  -For the tachycardia, once the metabolic work-up was reassuring we proceeded with adenosine to ensure there is no underlying A. Fib/flutter.  It was clearly noted that patient is in sinus tachycardia.  -Hemoglobin is 7.9.  Patient has refused digital rectal exam.  He will  notify us when he has next BM.  He denies any new dark stools.  He always has dark stools because of his medications.  He suspects that his low hemoglobin is because of mishap at dialysis where they did not " return" all his blood.  Anemia panel has been sent along with type and screen.  -With the missed hemodialysis, patient has mild hyper K and mild pulmonary vascular congestion on chest x-ray.  He can get his dialysis after being admitted to the hospital.  -At the time of admission, we have added CT PE to his work-up given that he had recent air travel and was complaining of chest pain and shortness of breath prior to ED arrival.  Hospitalist staff will follow up on the results.  Final Clinical Impression(s) / ED Diagnoses Final diagnoses:  Gingival abscess  Sinus tachycardia  Pulmonary congestion  ESRD (end stage renal disease) on dialysis [facility])    Rx / DC Orders ED Discharge Orders    None       Varney Biles, MD 08/20/20 [phone]

## 2020-08-20 NOTE — H&P (Signed)
History and Physical  Patient Name: Logan Martin     MWN:[id number]    DOB: 11/06/1955    DOA: 08/19/2020 PCP: [name], NP  Patient coming from: Home  Chief Complaint: tachycardia, generalized malaise    HPI: Logan Martin is a 65 y.o. male, with PMH of ESRD, HTN, HLD, CAD, history of adenocarcinoma of the bladder, anemia, thrombocytopenia, hx of PE, hepatitis C who presented to the ER on 08/19/2020 with general malaise, tachycardia.  Patient states that this morning he woke up and felt weak, overall poorly with some dyspnea, and some slight chest discomfort.  He has had similar symptoms in the past as he has had issues with volume overload, CAD and PE.  He was last treated here in December 2021 for dyspnea and substernal chest pain.  He had a heart cath performed that was nonrevealing.  However with his symptoms today, he was concerned he might have Covid even though he has been fully vaccinated.  Patient recently traveled from outside state visiting family.  He took a Science writer and got back on Thursday.  While visiting family he was doing inside her hemodialysis, last had hemodialysis on Wednesday.  It sounds like he had clotting in the tubing while there and the blood was unable to be rinsed back.  Typically at home he does home hemodialysis 4 times a week.  However did not do dialysis today due to feeling weak.  He has a history of PE many years ago and has been on aspirin daily.  He was previously treated with anticoagulation for about a year but cannot tell me what he took, but he says it is a pill.  He has allergies to heparin products.  Due to his symptoms, he called EMS for further evaluation.    ED course: -Vitals on admission:, Heart rate 135, respiratory rate 19, blood pressure 113/55, maintaining sats on room air -Labs on initial presentation: Sodium 138, potassium 5.2, chloride 96, bicarb 29, glucose 90, BUN 89, creatinine 9.27, troponin 59, WBC 5.3, hemoglobin 7.9, platelets  124 -Imaging obtained on admission: Chest x-ray showed mild cardiomegaly, no interstitial edema -In the ED patient was given adenosine.  Adenosine did not reveal A. fib or a flutter.showed sinus tachycardia, but patient remained sinus tachycardic.  CTA of the chest was ordered however did not have adequate access so unable to be obtained.  ER provider also ordered Unasyn and Lokelma.  However patient's IV access stopped working and could not get access to give.  The hospitalist service was contacted for further evaluation and management.     ROS: A complete and thorough 12 point review of systems obtained, negative listed in HPI.     Past Medical History:  Diagnosis Date  . Alcohol abuse    quit date in 1995  . Anemia   . Cancer ([place])    bladder  . Chronic low back pain   . Cocaine abuse in remission ([place])    Quit date in 1995  . ESRD (end stage renal disease) ([place])    Home Dialysis  . 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 Started HD in [place], [place].  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  On NxStage home hemo since 2010. Using RUA AVF.    [name] Hearing aid worn    B/L  . Hepatitis C    s/p treatment with Mavyret  . History of blood transfusion 2007 X 1  . History  of kidney stones   . Hx of constipation   . Hypertension   . Left inguinal hernia   . Peripheral vascular disease ([place])   . Pulmonary embolism [facility]) Feb 2012   Treated with coumadin x 1 year  . Wears glasses     Past Surgical History:  Procedure Laterality Date  . A/V FISTULAGRAM N/A 11/22/2017   Procedure: A/V Fistulagram;  Surgeon: Serafina Mitchell, MD;  Location: [place] [facility];  Service: Cardiovascular;  Laterality: N/A;  rt. arm   . ABDOMINAL HERNIA REPAIR  2000    x 2  . AV FISTULA PLACEMENT Right 2007   upper arm  . AV FISTULA REPAIR Right ~ 03/2016 & 04/2016  . BLADDER AUGMENTATION  1975  . COLONOSCOPY    . CORONARY ANGIOPLASTY WITH STENT PLACEMENT   1999  . CYSTOSCOPY W/ URETERAL STENT PLACEMENT N/A 10/13/2017   Procedure: CYSTOSCOPY WITH IRRIGATION OF BLADDER PLACEMENT OF FOLEY;  Surgeon: Ceasar Mons, MD;  Location: [place];  Service: Urology;  Laterality: N/A;  . CYSTOSCOPY WITH FULGERATION N/A 03/27/2018   Procedure: [place] AND MUCOUS EVACUATION, BLADDER BIOPSY;  Surgeon: Franchot Gallo, MD;  Location: [facility];  Service: Urology;  Laterality: N/A;  . ESOPHAGOGASTRODUODENOSCOPY    . EXTRACORPOREAL SHOCK WAVE LITHOTRIPSY    . FISTULA SUPERFICIALIZATION Right 08/31/5033   Procedure: PLICATION OF ANEURYSM OF ARTERIOVENOUS FISTULA;  Surgeon: Angelia Mould, MD;  Location: [place];  Service: Vascular;  Laterality: Right;  . HERNIA REPAIR    . INGUINAL HERNIA REPAIR Right 1975  . INGUINAL HERNIA REPAIR Left 03/04/2019   Procedure: LEFT OPEN INGUINAL HERNIA REPAIR WITH MESH;  Surgeon: Kinsinger, Arta Bruce, MD;  Location: [place];  Service: General;  Laterality: Left;  GENERAL AND LMA  . INSERTION OF DIALYSIS CATHETER Left 03/15/2020   Procedure: INSERTION OF Left Internal Jugular DIALYSIS CATHETER;  Surgeon: [name], MD;  Location: [place];  Service: Vascular;  Laterality: Left;  . IR FLUORO GUIDE CV LINE LEFT  03/11/2020  . IR US GUIDE VASC ACCESS LEFT  03/11/2020  . LEFT HEART CATH AND CORONARY ANGIOGRAPHY N/A 05/06/2020   Procedure: LEFT HEART CATH AND CORONARY ANGIOGRAPHY;  Surgeon: Nigel Mormon, MD;  Location: [place] [facility];  Service: Cardiovascular;  Laterality: N/A;  . PERIPHERAL VASCULAR BALLOON ANGIOPLASTY  11/22/2017   Procedure: PERIPHERAL VASCULAR BALLOON ANGIOPLASTY;  Surgeon: Serafina Mitchell, MD;  Location: [place] [facility];  Service: Cardiovascular;;  rt. arm fistula  . PERIPHERAL VASCULAR CATHETERIZATION N/A 06/22/2016   Procedure: Left Arm Venography;  Surgeon: Elam Dutch, MD;  Location: [place] [facility];  Service: Cardiovascular;  Laterality: N/A;  . PERIPHERAL  VASCULAR CATHETERIZATION N/A 06/22/2016   Procedure: A/V Fistulagram - Right Arm;  Surgeon: Elam Dutch, MD;  Location: [place] [facility];  Service: Cardiovascular;  Laterality: N/A;  . PERIPHERAL VASCULAR CATHETERIZATION Right 06/22/2016   Procedure: Peripheral Vascular Balloon Angioplasty;  Surgeon: Elam Dutch, MD;  Location: [place] [facility];  Service: Cardiovascular;  Laterality: Right;  arm fistula  . REVISON OF ARTERIOVENOUS FISTULA Right 03/15/2020   Procedure: RIGHT ARM ARTERIOVENOUS FISTULA REVISON;  Surgeon: [name], MD;  [facility];  Service: Vascular;  Laterality: Right;    Social History: Patient lives at home.  The patient walks without assistance.  Former smoker.  Allergies  Allergen Reactions  . Heparin Rash    Pork products   . Pork-Derived  Products Rash  . Sulfa Antibiotics Rash    Family history: family history includes Cancer in his mother; Deep vein thrombosis in his daughter; Diabetes in his mother; Hypertension in his mother.  Prior to Admission medications   Medication Sig Start Date End Date Taking? Authorizing Provider  amLODipine (NORVASC) 10 MG tablet Take 1 tablet (10 mg total) by mouth daily. 03/18/20  Yes Aline August, MD  aspirin EC 81 MG tablet Take 81 mg by mouth at bedtime.    Yes [provider]  atorvastatin (LIPITOR) 40 MG tablet Take 40 mg by mouth daily. 01/20/20  Yes [provider]  AURYXIA 1 GM 210 MG(Fe) tablet Take 420 mg by mouth 3 (three) times daily. 02/12/20  Yes [provider]  hydrALAZINE (APRESOLINE) 25 MG tablet Take 1 tablet (25 mg total) by mouth 3 (three) times daily. 05/07/20  Yes Bonnell Public, MD  hydrOXYzine (VISTARIL) 50 MG capsule Take 50 mg by mouth daily at 6 (six) AM. 08/17/20  Yes [provider]  isosorbide mononitrate (IMDUR) 30 MG 24 hr tablet Take 30 mg by mouth daily.  08/15/18 08/19/20 Yes [provider]  labetalol (NORMODYNE) 100 MG tablet Take  100 mg by mouth daily. 08/03/20  Yes [provider]  lidocaine-prilocaine (EMLA) cream Apply 1 application topically See admin instructions. Apply small amount to dialysis port (AVF) one hour before dialysis. Cover with occlusive dressing (saran wrap) 11/14/17  Yes [provider]  LOKELMA 5 g packet Take 1 packet by mouth daily. 08/03/20  Yes [provider]  methocarbamol (ROBAXIN) 750 MG tablet Take 750 mg by mouth every 8 (eight) hours as needed for muscle spasms. 08/12/20  Yes [provider]  oxyCODONE (OXY IR/ROXICODONE) 5 MG immediate release tablet Take 5 mg by mouth 2 (two) times daily as needed for moderate pain. 04/29/20  Yes [provider]  sucralfate (CARAFATE) 1 g tablet Take 1 g by mouth daily. 08/12/20  Yes [provider]  traZODone (DESYREL) 150 MG tablet Take 150 mg by mouth daily as needed. 08/17/20  Yes [provider]  Vitamin D, Ergocalciferol, (DRISDOL) 1.25 MG (50000 UNIT) CAPS capsule Take 50,000 Units by mouth once a week. 08/03/20  Yes [provider]  metoprolol succinate (TOPROL-XL) 50 MG 24 hr tablet Take 1 tablet (50 mg total) by mouth daily. Take with or immediately following a meal. Patient not taking: No sig reported 05/07/20   Bonnell Public, MD       Physical Exam: BP 120/64   Pulse (!) 130   Temp 99.3 F (37.4 C) (Oral)   Resp (!) 21   Ht 5\' 4"  (1.626 m)   Wt 45.4 kg   SpO2 100%   BMI 17.16 kg/m   General appearance:  Frail-appearing adult male, alert and in no acute distress .   Eyes: Anicteric, conjunctiva pink, lids and lashes normal. PERRL.    ENT:  Appears to have developing abscess in the lower jaw gumline Neck: No neck masses.  Trachea midline.  No thyromegaly/tenderness. Lymph: No cervical or supraclavicular lymphadenopathy. Cardiac:  Tachycardic, no murmurs appreciated.  No LE edema. [name] Respiratory: Normal respiratory rate and rhythm.  Diminished breath sounds  bilaterally, no wheezing Abdomen: Abdomen soft.  No tenderness with palpation. No ascites, distension, hepatosplenomegaly.   MSK: No deformities or effusions of the large joints of the upper or lower extremities bilaterally.  No cyanosis or clubbing. Neuro: Cranial nerves 2 through 12 grossly intact.  Sensation intact to light touch. Speech is fluent.  [name]    Psych: Sensorium intact and responding to questions, attention normal.  Behavior appropriate.  Judgment and insight appear normal.    Labs on Admission:  I have personally reviewed following labs and imaging studies: CBC: Recent Labs  Lab 08/19/20 2125  WBC 5.3  HGB 7.9*  HCT 25.2*  MCV 103.7*  PLT 595*   Basic Metabolic Panel: Recent Labs  Lab 08/19/20 1855 08/19/20 1916  NA 138  --   K 5.2*  --   CL 96*  --   CO2 29  --   GLUCOSE 90  --   BUN 89*  --   CREATININE 9.27*  --   CALCIUM 9.2  --   MG  --  1.9   GFR: Estimated Creatinine Clearance: 5.2 mL/min (A) (by C-G formula based on SCr of 9.27 mg/dL (H)).  Liver Function Tests: No results for input(s): AST, ALT, ALKPHOS, BILITOT, PROT, ALBUMIN in the last 168 hours. No results for input(s): LIPASE, AMYLASE in the last 168 hours. No results for input(s): AMMONIA in the last 168 hours. Coagulation Profile: Recent Labs  Lab 08/19/20 1855  INR 1.1   Cardiac Enzymes: No results for input(s): CKTOTAL, CKMB, CKMBINDEX, TROPONINI in the last 168 hours. BNP (last 3 results) No results for input(s): PROBNP in the last 8760 hours. HbA1C: No results for input(s): HGBA1C in the last 72 hours. CBG: No results for input(s): GLUCAP in the last 168 hours. Lipid Profile: No results for input(s): CHOL, HDL, LDLCALC, TRIG, CHOLHDL, LDLDIRECT in the last 72 hours. Thyroid Function Tests: No results for input(s): TSH, T4TOTAL, FREET4, T3FREE, THYROIDAB in the last 72 hours. Anemia Panel: No results for input(s): VITAMINB12, FOLATE, FERRITIN, TIBC, IRON, RETICCTPCT in the  last 72 hours.   No results found for this or any previous visit (from the past 240 hour(s)).         Radiological Exams on Admission: Personally reviewed imaging which shows: Cardiomegaly with cardiovascular congestion, mild edema DG Chest 2 View  Result Date: 08/19/2020 CLINICAL DATA:  Chest pain shortness of breath EXAM: CHEST - 2 VIEW COMPARISON:  Chest radiograph May 04, 2020 FINDINGS: Right subclavian/innominate stent, unchanged. Mild cardiac enlargement. Central vascular congestion. Mild basilar predominant interstitial opacities. The visualized skeletal structures are unremarkable. IMPRESSION: Mild cardiac enlargement with central vascular congestion and basilar predominant interstitial opacities, likely mild edema. Electronically Signed   By: Dahlia Bailiff MD   On: 08/19/2020 20:18    EKG: Independently reviewed.  Sinus tachycardia     Assessment/Plan   1.  Tachycardia -On admission heart rate in the 130s.  Received adenosine in the ED with no underlying A. fib or a flutter revealed, sinus tachycardia.  Remains sinus tachycardic -Concern for PE given history of PE and recent travel.  Additionally concern for symptomatic anemia given his CAD and hemoglobin 7.9 admission -Allergic to heparin and given ESRD, options limited.  Contacted pharmacy, recommended argatroban.  Pharmacy consulted to help with dosing -Plan to get CTA of the chest once adequate access obtained -Ordered 1 unit of PRBC -Restart home labetalol -IV team consulted given inability obtain access.  If unable to be obtained, possibly will need power line placement by IR -Admitted to progressive care unit for close cardiac monitoring  2.  Symptomatic anemia -On admission hemoglobin 7.9.  Most recent hemoglobin was 12.1 in December 2021 -History of anemia of chronic disease, ESRD, and iron deficiency -Patient said recently  his tubing clogged at dialysis and he was unable to get flushed back -Anemia labs  ordered -Patient refused fecal Hemoccult blood testing in the ED -Ordered 1 unit of PRBCs given history of CAD and symptoms -Plan to check H&H 2 hours after transfusion finishes   3.  ESRD on hemodialysis -Patient typically does home hemodialysis 4 times a week however traveled out of town and was doing in center HD until coming back home a few days ago.  Last had hemodialysis on Wednesday -No acute indication for hemodialysis at this time -Plan to contact nephrology in the morning as he will likely need hemodialysis Saturday for clearance and possibly volume -Continue home Lokelma -Daily weights  4.  Hyperkalemia -Secondary to ESRD -On admission potassium 5.2 -Continue home [place] -Plan to contact nephrology in the morning for hemodialysis  5.  Oral infection -On physical exam, appears to have gum line abscess.  Patient refused I&D in the ED -Started on Unasyn, plan to continue.  Pharmacy consulted to help with dosing  6.  Essential hypertension -Will continue labetalol given tachycardia, but will hold other home antihypertensives for now given being normotensive and concern for PE.     DVT prophylaxis: Argatroban Code Status: Full Family Communication: Patient only Disposition Plan: Anticipate discharge home when medically optimized Consults called: none Admission status: Progressive care unit     Medical decision making: Patient seen at 12:42 AM on 08/20/2020.  The patient was discussed with ER provider.  What exists of the patient's chart was reviewed in depth and summarized above.  Clinical condition: Watcher.        Doran Heater [facility] Please page though [place] or Epic secure chat:  For password, contact charge nurse

## 2020-08-20 NOTE — Progress Notes (Signed)
Dear Doctor:  This patient has been identified as a candidate for central line for the following reason (s): IV therapy over 48 hours and poor veins/poor circulatory system (CHF, COPD, emphysema, diabetes, steroid use, IV drug abuse, etc.) If you agree, please write an order for the indicated device.   Thank you for supporting the early vascular access assessment program.

## 2020-08-20 NOTE — Sepsis Progress Note (Signed)
Antibiotics unable to be given at this time due to no IV access. Team working on access now.

## 2020-08-20 NOTE — Progress Notes (Signed)
Following for Code Sepsis  

## 2020-08-21 DIAGNOSIS — D62 Acute posthemorrhagic anemia: Secondary | ICD-10-CM | POA: Diagnosis not present

## 2020-08-21 DIAGNOSIS — K922 Gastrointestinal hemorrhage, unspecified: Secondary | ICD-10-CM | POA: Diagnosis not present

## 2020-08-21 DIAGNOSIS — N186 End stage renal disease: Secondary | ICD-10-CM | POA: Diagnosis not present

## 2020-08-21 DIAGNOSIS — K122 Cellulitis and abscess of mouth: Secondary | ICD-10-CM | POA: Diagnosis not present

## 2020-08-21 LAB — CBC
HCT: 21.9 % — ABNORMAL LOW (ref 39.0–52.0)
Hemoglobin: 7.6 g/dL — ABNORMAL LOW (ref 13.0–17.0)
MCH: 32.2 pg (ref 26.0–34.0)
MCHC: 34.7 g/dL (ref 30.0–36.0)
MCV: 92.8 fL (ref 80.0–100.0)
Platelets: 136 10*3/uL — ABNORMAL LOW (ref 150–400)
RBC: 2.36 MIL/uL — ABNORMAL LOW (ref 4.22–5.81)
RDW: 19.3 % — ABNORMAL HIGH (ref 11.5–15.5)
WBC: 3.5 10*3/uL — ABNORMAL LOW (ref 4.0–10.5)
nRBC: 0 % (ref 0.0–0.2)

## 2020-08-21 LAB — COMPREHENSIVE METABOLIC PANEL
ALT: 11 U/L (ref 0–44)
AST: 17 U/L (ref 15–41)
Albumin: 3 g/dL — ABNORMAL LOW (ref 3.5–5.0)
Alkaline Phosphatase: 69 U/L (ref 38–126)
Anion gap: 10 (ref 5–15)
BUN: 34 mg/dL — ABNORMAL HIGH (ref 8–23)
CO2: 28 mmol/L (ref 22–32)
Calcium: 9.2 mg/dL (ref 8.9–10.3)
Chloride: 97 mmol/L — ABNORMAL LOW (ref 98–111)
Creatinine, Ser: 5.82 mg/dL — ABNORMAL HIGH (ref 0.61–1.24)
GFR, Estimated: 10 mL/min — ABNORMAL LOW (ref >60)
Glucose, Bld: 80 mg/dL (ref 70–99)
Potassium: 3.7 mmol/L (ref 3.5–5.1)
Sodium: 135 mmol/L (ref 135–145)
Total Bilirubin: 1 mg/dL (ref 0.3–1.2)
Total Protein: 5.5 g/dL — ABNORMAL LOW (ref 6.5–8.1)

## 2020-08-21 LAB — HEMOGLOBIN AND HEMATOCRIT, BLOOD
HCT: 27.9 % — ABNORMAL LOW (ref 39.0–52.0)
Hemoglobin: 9.5 g/dL — ABNORMAL LOW (ref 13.0–17.0)

## 2020-08-21 LAB — PREPARE RBC (CROSSMATCH)

## 2020-08-21 MED ORDER — SODIUM CHLORIDE 0.9% IV SOLUTION: Freq: Once | INTRAVENOUS | Status: AC

## 2020-08-21 MED ORDER — OXYCODONE HCL 5 MG PO TABS
5.0000 mg | ORAL_TABLET | Freq: Two times a day (BID) | ORAL | Status: DC | PRN
  Administered 2020-08-21 – 2020-08-22 (×3): 5 mg via ORAL
  Filled 2020-08-21 (×3): qty 1

## 2020-08-21 MED ORDER — PEG 3350-KCL-NA BICARB-NACL 420 G PO SOLR
4000.0000 mL | Freq: Once | ORAL | Status: AC
  Administered 2020-08-21: 4000 mL via ORAL
  Filled 2020-08-21: qty 4000

## 2020-08-21 NOTE — Anesthesia Preprocedure Evaluation (Addendum)
Anesthesia Evaluation  Patient identified by MRN, date of birth, ID band Patient awake    Reviewed: Allergy & Precautions, H&P , NPO status , Patient's Chart, lab work & pertinent test results, reviewed documented beta blocker date and time   Airway Mallampati: II  TM Distance: >3 FB Neck ROM: Full    Dental  (+) Teeth Intact, Dental Advisory Given   Pulmonary neg pulmonary ROS, former smoker,  Quit smoking 1995,  Pack year history     + decreased breath sounds      Cardiovascular hypertension, Pt. on home beta blockers and Pt. on medications + CAD, + Past MI, + Peripheral Vascular Disease and +CHF  + Valvular Problems/Murmurs MR  Rhythm:Regular Rate:Normal  TTE 2021 EF 50%, RV function low normal, mod to severe MR  LHC 2021 LM: Normal LAD: Distal LAD myocardial bridge LCx: Normal Ramus: Normal RCA: Prox RCA patent stent. No ISR         Distal RCA 40% stenosis    Neuro/Psych negative neurological ROS  negative psych ROS   GI/Hepatic GERD  Medicated and Controlled,(+)     substance abuse  alcohol use and cocaine use, Hepatitis -, CQuit drugs and alcohol in 1995  HCV- treated   Endo/Other  negative endocrine ROS  Renal/GU ESRF and DialysisRenal diseaseESRD secondary to reflux nephropathy. Started HD in [place], [place].  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  On NxStage home hemo since 2010. Using RUA AVF.     Hx bladder ca negative genitourinary   Musculoskeletal negative musculoskeletal ROS (+) Osteoarthritis,  Chronic LBP- on oxy 66m   Abdominal   Peds  Hematology  (+) Blood dyscrasia, anemia ,   Anesthesia Other Findings Colonoscopy for melena, Hgb 9.5  Reproductive/Obstetrics negative OB ROS                            Anesthesia Physical  Anesthesia Plan  ASA: III  Anesthesia Plan: MAC   Post-op Pain Management:    Induction: Intravenous  PONV  Risk Score and Plan: 1 and Treatment may vary due to age or medical condition and Propofol infusion  Airway Management Planned: Simple Face Mask and Natural Airway  Additional Equipment: None  Intra-op Plan:   Post-operative Plan:   Informed Consent: I have reviewed the patients History and Physical, chart, labs and discussed the procedure including the risks, benefits and alternatives for the proposed anesthesia with the patient or authorized representative who has indicated his/her understanding and acceptance.     Dental advisory given  Plan Discussed with: CRNA  Anesthesia Plan Comments: (Allergy to heparin- gets alteplase for HD cath lock)        Anesthesia Quick Evaluation

## 2020-08-21 NOTE — Progress Notes (Signed)
Subjective: No acute events.  Objective: Vital signs in last 24 hours: Temp:  [97.6 F (36.4 C)-98.7 F (37.1 C)] 97.9 F (36.6 C) (03/27 0320) Pulse Rate:  [80-118] 91 (03/26 1758) Resp:  [11-26] 12 (03/27 0320) BP: (126-179)/(46-140) 138/67 (03/27 0320) SpO2:  [96 %-100 %] 100 % (03/27 0320) Weight:  [48.3 kg-50.1 kg] 48.5 kg (03/27 0011) Last BM Date: 08/20/20  Intake/Output from previous day: 03/26 0701 - 03/27 0700 In: 1435 [P.O.:120; I.V.:500; Blood:815] Out: -  Intake/Output this shift: No intake/output data recorded.  General appearance: snoring, soundly sleeping Resp: clear to auscultation bilaterally Cardio: regular rate and rhythm GI: soft, non-tender; bowel sounds normal; no masses,  no organomegaly Extremities: extremities normal, atraumatic, no cyanosis or edema  Lab Results: Recent Labs    08/19/20 2125 08/20/20 0404 08/20/20 1956 08/21/20 0409  WBC 5.3 4.8  --  3.5*  HGB 7.9* 6.4* 8.5* 7.6*  HCT 25.2* 19.9* 25.2* 21.9*  PLT 124* 167  --  136*   BMET Recent Labs    08/20/20 0404 08/20/20 1956 08/21/20 0409  NA 136 134* 135  K 5.0 3.3* 3.7  CL 97* 99 97*  CO2 23 29 28   GLUCOSE 93 95 80  BUN 96* 30* 34*  CREATININE 9.76* 4.61* 5.82*  CALCIUM 9.5 9.0 9.2   LFT Recent Labs    08/21/20 0409  PROT 5.5*  ALBUMIN 3.0*  AST 17  ALT 11  ALKPHOS 69  BILITOT 1.0   PT/INR Recent Labs    08/19/20 1855  LABPROT 13.8  INR 1.1   Hepatitis Panel Recent Labs    08/20/20 1956  HEPBSAG NON REACTIVE   C-Diff No results for input(s): CDIFFTOX in the last 72 hours. Fecal Lactopherrin No results for input(s): FECLLACTOFRN in the last 72 hours.  Studies/Results: DG Chest 2 View  Result Date: 08/19/2020 CLINICAL DATA:  Chest pain shortness of breath EXAM: CHEST - 2 VIEW COMPARISON:  Chest radiograph May 04, 2020 FINDINGS: Right subclavian/innominate stent, unchanged. Mild cardiac enlargement. Central vascular congestion. Mild basilar  predominant interstitial opacities. The visualized skeletal structures are unremarkable. IMPRESSION: Mild cardiac enlargement with central vascular congestion and basilar predominant interstitial opacities, likely mild edema. Electronically Signed   By: Dahlia Bailiff MD   On: 08/19/2020 20:18   IR Veno/Jugular Right  Result Date: 08/20/2020 INDICATION: 65 year old with end-stage renal disease, hematochezia, anemia and poor venous access. Patient needs a central venous catheter. EXAM: PLACEMENT OF CENTRAL LINE WITH ULTRASOUND AND FLUOROSCOPIC GUIDANCE CENTRAL VENOGRAM COMPARISON:  None. MEDICATIONS: None ANESTHESIA/SEDATION: None FLUOROSCOPY TIME:  3 minutes, 48 seconds, 56.2 mGy) COMPLICATIONS: None immediate. PROCEDURE: The procedure, risks, benefits, and alternatives were explained to the patient. Questions regarding the procedure were encouraged and answered. The patient understands and consents to the procedure. Right side of the neck was evaluated with ultrasound. Ultrasound confirmed a patent right internal jugular vein. Ultrasound image was saved for documentation. Right side of the neck was prepped and draped in sterile fashion. Maximal barrier sterile technique was utilized including caps, mask, sterile gowns, sterile gloves, sterile drape, hand hygiene and skin antiseptic. Skin was anesthetized with 1% lidocaine. 21 gauge needle was directed into the right internal jugular vein but a wire would not advance centrally. Micropuncture dilator set was placed. Contrast was injected for a venogram. 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 Microcatheter was removed with manual compression. Dressing was placed. Ultrasound confirmed a patent left internal jugular vein. Ultrasound image was saved  for documentation. Left side of the neck was prepped and draped in sterile fashion. Maximal barrier sterile technique was utilized including caps, mask, sterile gowns, sterile gloves,  sterile drape, hand hygiene and skin antiseptic. Skin was anesthetized using 1% lidocaine. Small incision was made. 21 gauge needle was directed into the left internal jugular vein with ultrasound guidance. Micropuncture dilator set was placed. Wire would not easily advance into the SVC due to the right innominate stent. Venogram was performed to confirm patency. Kumpe catheter and Glidewire were used to cannulate the SVC from the left jugular approach. A Platinum Plus wire was placed and the catheter was removed. Peel-away sheath was placed. A dual lumen Power PICC line was cut to 23 cm and advanced over the wire into the SVC. Wire was removed. Both lumens aspirated and flushed well. Catheter was sutured to the skin. Dressing was placed. 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 Wire would not advance into the SVC from right jugular approach. 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 Central venogram from the left jugular approach confirmed patency of the left innominate vein but there is significant narrowing at the junction of the left innominate vein and SVC related to the right innominate vein stent. Eventually a Kumpe catheter and Glidewire were able to cannulate the SVC from a left jugular approach. The central venous catheter was positioned near the superior cavoatrial junction. IMPRESSION: Successful placement of a central venous catheter using ultrasound and fluoroscopic guidance. Catheter was placed from a left jugular approach. 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 Right jugular vein is patent in the neck but the lower portion of the right jugular vein is occluded from the right innominate vein stent. Patient is not a candidate for future right jugular central venous access. Electronically Signed    By: Markus Daft M.D.   On: 08/20/2020 15:15   IR Fluoro Guide CV Line Left  Result Date: 08/20/2020 INDICATION: 65 year old with end-stage renal disease, hematochezia, anemia and poor venous access. Patient needs a central venous catheter. EXAM: PLACEMENT OF CENTRAL LINE WITH ULTRASOUND AND FLUOROSCOPIC GUIDANCE CENTRAL VENOGRAM COMPARISON:  None. MEDICATIONS: None ANESTHESIA/SEDATION: None FLUOROSCOPY TIME:  3 minutes, 48 seconds, 80.9 mGy) COMPLICATIONS: None immediate. PROCEDURE: The procedure, risks, benefits, and alternatives were explained to the patient. Questions regarding the procedure were encouraged and answered. The patient understands and consents to the procedure. Right side of the neck was evaluated with ultrasound. Ultrasound confirmed a patent right internal jugular vein. Ultrasound image was saved for documentation. Right side of the neck was prepped and draped in sterile fashion. Maximal barrier sterile technique was utilized including caps, mask, sterile gowns, sterile gloves, sterile drape, hand hygiene and skin antiseptic. Skin was anesthetized with 1% lidocaine. 21 gauge needle was directed into the right internal jugular vein but a wire would not advance centrally. Micropuncture dilator set was placed. Contrast was injected for a venogram. 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 Microcatheter was removed with manual compression. Dressing was placed. Ultrasound confirmed a patent left internal jugular vein. Ultrasound image was saved for documentation. Left side of the neck was prepped and draped in sterile fashion. Maximal barrier sterile technique was utilized including caps, mask, sterile gowns, sterile gloves, sterile drape, hand hygiene and skin antiseptic. Skin was anesthetized  using 1% lidocaine. Small incision was made. 21 gauge needle was directed into the left internal jugular vein with ultrasound guidance. Micropuncture dilator set was placed.  Wire would not easily advance into the SVC due to the right innominate stent. Venogram was performed to confirm patency. Kumpe catheter and Glidewire were used to cannulate the SVC from the left jugular approach. A Platinum Plus wire was placed and the catheter was removed. Peel-away sheath was placed. A dual lumen Power PICC line was cut to 23 cm and advanced over the wire into the SVC. Wire was removed. Both lumens aspirated and flushed well. Catheter was sutured to the skin. Dressing was placed. 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 Wire would not advance into the SVC from right jugular approach. 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 Central venogram from the left jugular approach confirmed patency of the left innominate vein but there is significant narrowing at the junction of the left innominate vein and SVC related to the right innominate vein stent. Eventually a Kumpe catheter and Glidewire were able to cannulate the SVC from a left jugular approach. The central venous catheter was positioned near the superior cavoatrial junction. IMPRESSION: Successful placement of a central venous catheter using ultrasound and fluoroscopic guidance. Catheter was placed from a left jugular approach. 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 Right jugular vein is patent in the neck but the lower portion of the right jugular vein is occluded from the right innominate vein stent. Patient is not a candidate for future right jugular central venous access. Electronically Signed   By: Markus Daft M.D.   On: 08/20/2020 15:15   IR US Guide Vasc Access Left  Result Date: 08/20/2020 INDICATION: 65 year old with end-stage renal disease, hematochezia, anemia and poor venous access. Patient needs a central venous catheter.  EXAM: PLACEMENT OF CENTRAL LINE WITH ULTRASOUND AND FLUOROSCOPIC GUIDANCE CENTRAL VENOGRAM COMPARISON:  None. MEDICATIONS: None ANESTHESIA/SEDATION: None FLUOROSCOPY TIME:  3 minutes, 48 seconds, 95.6 mGy) COMPLICATIONS: None immediate. PROCEDURE: The procedure, risks, benefits, and alternatives were explained to the patient. Questions regarding the procedure were encouraged and answered. The patient understands and consents to the procedure. Right side of the neck was evaluated with ultrasound. Ultrasound confirmed a patent right internal jugular vein. Ultrasound image was saved for documentation. Right side of the neck was prepped and draped in sterile fashion. Maximal barrier sterile technique was utilized including caps, mask, sterile gowns, sterile gloves, sterile drape, hand hygiene and skin antiseptic. Skin was anesthetized with 1% lidocaine. 21 gauge needle was directed into the right internal jugular vein but a wire would not advance centrally. Micropuncture dilator set was placed. Contrast was injected for a venogram. 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 Microcatheter was removed with manual compression. Dressing was placed. Ultrasound confirmed a patent left internal jugular vein. Ultrasound image was saved for documentation. Left side of the neck was prepped and draped in sterile fashion. Maximal barrier sterile technique was utilized including caps, mask, sterile gowns, sterile gloves, sterile drape, hand hygiene and skin antiseptic. Skin was anesthetized using 1% lidocaine. Small incision was made. 21 gauge needle was directed into the left internal jugular vein with ultrasound guidance. Micropuncture dilator set was placed. Wire would not easily advance into the SVC due to the  right innominate stent. Venogram was performed to confirm patency. Kumpe catheter and Glidewire were used to cannulate the SVC from the left jugular approach. A Platinum Plus wire was placed  and the catheter was removed. Peel-away sheath was placed. A dual lumen Power PICC line was cut to 23 cm and advanced over the wire into the SVC. Wire was removed. Both lumens aspirated and flushed well. Catheter was sutured to the skin. Dressing was placed. 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 Wire would not advance into the SVC from right jugular approach. 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 Central venogram from the left jugular approach confirmed patency of the left innominate vein but there is significant narrowing at the junction of the left innominate vein and SVC related to the right innominate vein stent. Eventually a Kumpe catheter and Glidewire were able to cannulate the SVC from a left jugular approach. The central venous catheter was positioned near the superior cavoatrial junction. IMPRESSION: Successful placement of a central venous catheter using ultrasound and fluoroscopic guidance. Catheter was placed from a left jugular approach. 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 Right jugular vein is patent in the neck but the lower portion of the right jugular vein is occluded from the right innominate vein stent. Patient is not a candidate for future right jugular central venous access. Electronically Signed   By: Markus Daft M.D.   On: 08/20/2020 15:15   IR US Guide Vasc Access Right  Result Date: 08/20/2020 INDICATION: 65 year old with end-stage renal disease, hematochezia, anemia and poor venous access. Patient needs a central venous catheter. EXAM: PLACEMENT OF CENTRAL LINE WITH ULTRASOUND AND FLUOROSCOPIC GUIDANCE CENTRAL VENOGRAM COMPARISON:  None. MEDICATIONS: None ANESTHESIA/SEDATION: None FLUOROSCOPY TIME:  3 minutes, 48 seconds, 00.9 mGy) COMPLICATIONS: None immediate.  PROCEDURE: The procedure, risks, benefits, and alternatives were explained to the patient. Questions regarding the procedure were encouraged and answered. The patient understands and consents to the procedure. Right side of the neck was evaluated with ultrasound. Ultrasound confirmed a patent right internal jugular vein. Ultrasound image was saved for documentation. Right side of the neck was prepped and draped in sterile fashion. Maximal barrier sterile technique was utilized including caps, mask, sterile gowns, sterile gloves, sterile drape, hand hygiene and skin antiseptic. Skin was anesthetized with 1% lidocaine. 21 gauge needle was directed into the right internal jugular vein but a wire would not advance centrally. Micropuncture dilator set was placed. Contrast was injected for a venogram. 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 Microcatheter was removed with manual compression. Dressing was placed. Ultrasound confirmed a patent left internal jugular vein. Ultrasound image was saved for documentation. Left side of the neck was prepped and draped in sterile fashion. Maximal barrier sterile technique was utilized including caps, mask, sterile gowns, sterile gloves, sterile drape, hand hygiene and skin antiseptic. Skin was anesthetized using 1% lidocaine. Small incision was made. 21 gauge needle was directed into the left internal jugular vein with ultrasound guidance. Micropuncture dilator set was placed. Wire would not easily advance into the SVC due to the right innominate stent. Venogram was performed to confirm patency. Kumpe catheter and Glidewire were used to cannulate the SVC from the left jugular approach. A Platinum Plus wire was placed and the catheter was removed. Peel-away sheath  was placed. A dual lumen Power PICC line was cut to 23 cm and advanced over the wire into the SVC. Wire was removed. Both lumens aspirated and flushed well. Catheter was sutured to the skin.  Dressing was placed. 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 Wire would not advance into the SVC from right jugular approach. 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 Central venogram from the left jugular approach confirmed patency of the left innominate vein but there is significant narrowing at the junction of the left innominate vein and SVC related to the right innominate vein stent. Eventually a Kumpe catheter and Glidewire were able to cannulate the SVC from a left jugular approach. The central venous catheter was positioned near the superior cavoatrial junction. IMPRESSION: Successful placement of a central venous catheter using ultrasound and fluoroscopic guidance. Catheter was placed from a left jugular approach. 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 Right jugular vein is patent in the neck but the lower portion of the right jugular vein is occluded from the right innominate vein stent. Patient is not a candidate for future right jugular central venous access. Electronically Signed   By: Markus Daft M.D.   On: 08/20/2020 15:15   CT Angio Abd/Pel w/ and/or w/o  Result Date: 08/21/2020 CLINICAL DATA:  Melena, gastrointestinal bleeding EXAM: CTA ABDOMEN AND PELVIS WITHOUT AND WITH CONTRAST TECHNIQUE: Multidetector CT imaging of the abdomen and pelvis was performed using the standard protocol during bolus administration of intravenous contrast. Multiplanar reconstructed images and MIPs were obtained and reviewed to evaluate the vascular anatomy. CONTRAST:  167mL OMNIPAQUE IOHEXOL 350 MG/ML SOLN COMPARISON:  03/27/2018 FINDINGS: VASCULAR Aorta: Normal caliber aorta without aneurysm, dissection, vasculitis or significant stenosis. Diffuse atherosclerosis. Celiac: Patent without evidence of aneurysm,  dissection, vasculitis or significant stenosis. SMA: The SMA is patent at its origin without focal stenosis, aneurysm, or dissection. Distal branches of the SMA supplying the bowel in the right lower quadrant demonstrates significant atheromatous plaque and multifocal high-grade stenoses. Renals: There is high-grade stenosis at the origin of the left renal artery. Moderate stenosis is seen within the origin of the right renal artery. No dissection, aneurysm, or fibromuscular dysplasia. IMA: Patent without evidence of aneurysm, dissection, vasculitis or significant stenosis. Significant atherosclerosis. Inflow: There is significant atheromatous plaque within the bilateral common iliac, external iliac, and internal iliac arteries. No high-grade stenosis. Proximal Outflow: There is extensive atherosclerosis throughout the visualized portions of the common femoral and superficial femoral arteries. No high-grade stenosis. Veins: No obvious venous abnormality within the limitations of this arterial phase study. Review of the MIP images confirms the above findings. NON-VASCULAR Lower chest: No acute pleural or parenchymal lung disease. Scattered emphysematous changes are noted. Hepatobiliary: Numerous cysts are seen throughout the liver. No other focal abnormalities. The gallbladder is unremarkable. Pancreas: Unremarkable. No pancreatic ductal dilatation or surrounding inflammatory changes. Spleen: Normal in size without focal abnormality. Adrenals/Urinary Tract: Extensive bilateral renal cortical atrophy with diffuse cystic changes consistent with history of end-stage renal disease. Adrenals are unremarkable. The bladder is decompressed. Stomach/Bowel: No bowel obstruction or ileus. No bowel wall thickening or inflammatory change. No evidence of contrast accumulation within the bowel lumen to suggest active gastrointestinal hemorrhage. Lymphatic: No pathologic adenopathy. Reproductive: Surgical clips are seen within the  prostate bed. Other: No free fluid  or free gas.  No abdominal wall hernia. Musculoskeletal: Diffuse changes of renal osteodystrophy. No acute bony abnormalities. Reconstructed images demonstrate no additional findings. IMPRESSION: VASCULAR 1. No evidence of active gastrointestinal hemorrhage. 2. Extensive atherosclerosis of the aorta and its branches. Focal areas of high-grade stenosis within distal branches of the SMA in the right lower quadrant. High-grade bilateral renal artery stenosis, left greater than right. NON-VASCULAR 1. Numerous bilateral hepatic cysts unchanged. 2. Bilateral renal cortical atrophy with diffuse cystic change, consistent with known end-stage renal disease. 3. No acute intra-abdominal or intrapelvic process. 4. Aortic Atherosclerosis (ICD10-I70.0) and Emphysema (ICD10-J43.9). Electronically Signed   By: Randa Ngo M.D.   On: 08/21/2020 00:02    Medications:  Scheduled: . sodium chloride   Intravenous Once  . amoxicillin-clavulanate  1 tablet Oral Daily  . atorvastatin  40 mg Oral Daily  . chlorhexidine  15 mL Mouth/Throat QID  . Chlorhexidine Gluconate Cloth  6 each Topical Q0600  . ferric citrate  420 mg Oral TID  . hydrOXYzine  50 mg Oral Q0600  . [START ON 08/23/2020] pantoprazole  40 mg Intravenous Q12H  . sodium zirconium cyclosilicate  10 g Oral Once  . sodium zirconium cyclosilicate  5 g Oral Daily  . traZODone  150 mg Oral QHS   Continuous: . pantoprozole (PROTONIX) infusion 8 mg/hr (08/20/20 1300)    Assessment/Plan: 1) Hematochezia. 2) Anemia.   The patient did not have any acute events over night.  NO further documentaiton of any bleeding.  He did received blood transfusions.  The CTA was negative for any overt bleeding source, but he does have severe atherosclerosis.  Plan: 1) Colonoscopy tomorrow. 2) Follow HGB and transfuse as necessary.  LOS: 1 day   HUNG,PATRICK D 08/21/2020, 7:27 AM

## 2020-08-21 NOTE — Progress Notes (Signed)
  [facility] Progress Note   Subjective:  Dialysis yesterday net UF 2.5L -s/o early Seen in room - no new complaints. No cp, sob.  For colonoscopy today    Objective Vitals:   08/21/20 0322 08/21/20 0403 08/21/20 0433 08/21/20 0751  BP: 120/70 118/72 116/80 124/74  Pulse: (!) 115 (!) 115 (!) 115 (!) 125  Resp: 20 20 20 20   Temp: (!) 97.1 F (36.2 C) (!) 97.3 F (36.3 C) 97.6 F (36.4 C) (!) 97.4 F (36.3 C)  TempSrc:    Oral  SpO2:    99%  Weight:      Height:         Additional Objective Labs: Basic Metabolic Panel: Recent Labs  Lab 08/20/20 0404 08/20/20 1956 08/21/20 0409  NA 136 134* 135  K 5.0 3.3* 3.7  CL 97* 99 97*  CO2 23 29 28   GLUCOSE 93 95 80  BUN 96* 30* 34*  CREATININE 9.76* 4.61* 5.82*  CALCIUM 9.5 9.0 9.2   CBC: Recent Labs  Lab 08/19/20 2125 08/20/20 0404 08/20/20 1956 08/21/20 0409  WBC 5.3 4.8  --  3.5*  HGB 7.9* 6.4* 8.5* 7.6*  HCT 25.2* 19.9* 25.2* 21.9*  MCV 103.7* 101.5*  --  92.8  PLT 124* 167  --  136*   Blood Culture    Component Value Date/Time   SDES BLOOD SITE NOT SPECIFIED 08/20/2020 0404   SPECREQUEST AEROBIC BOTTLE ONLY Blood Culture adequate volume 08/20/2020 0404   CULT  08/20/2020 0404    NO GROWTH < 12 HOURS Performed at [facility], [place] [address], [place], [place] [address]    REPTSTATUS PENDING 08/20/2020 0404     Physical Exam General: Non-toxic appearing, nad  Heart: Tachy, regular  Lungs: Clear, no rales, no wheeze  Abdomen: soft non-tender  Extremities: No LE edema  Dialysis Access: RUE AVF +bruit   Medications: . pantoprozole (PROTONIX) infusion 8 mg/hr (08/20/20 1300)   . sodium chloride   Intravenous Once  . sodium chloride   Intravenous Once  . amoxicillin-clavulanate  1 tablet Oral Daily  . atorvastatin  40 mg Oral Daily  . chlorhexidine  15 mL Mouth/Throat QID  . Chlorhexidine Gluconate Cloth  6 each Topical Q0600  . ferric citrate  420 mg Oral TID  .  hydrOXYzine  50 mg Oral Q0600  . [START ON 08/23/2020] pantoprazole  40 mg Intravenous Q12H  . polyethylene glycol-electrolytes  4,000 mL Oral Once  . sodium zirconium cyclosilicate  10 g Oral Once  . sodium zirconium cyclosilicate  5 g Oral Daily  . traZODone  150 mg Oral QHS    Dialysis Orders:  Nx Stage MTTF EDW 46.5kg   Assessment/Plan: 1. Tachycardia. Hx of PE. Low clinical suspicion for PE at this point. Felt 2/2 to anemia.  w/u per primary 2. Melena/GIB -  s/p 2 u prbcs 3/26. On Protonix For colonoscopy today  3. ESRD -  Home HD. Will continue MWF while here. Next HD 3/28. No heparin.  K+ 3.7  4. Anemia --  As above. Hgb 7.6 s/p transfusion. Tsat 29%.Ferritin 1386.  Received Aranesp 60 with HD 3/26.  5. Hypertension/volume  - BP controlled. . UF to dry weight as tolerated  6. Metabolic bone disease -  Continue Auryxia, calcitriol, Sensipar when taking po.  7. Oral abscess - noted on admit. Per primary   Lynnda Child PA-C [facility] 08/21/2020,9:27 AM

## 2020-08-21 NOTE — Progress Notes (Signed)
PROGRESS NOTE                                                                             PROGRESS NOTE                                                                                                                                                                                                             Patient Demographics:    Logan Martin, is a 65 y.o. male, DOB - 12/30/1955, XNT:[id number]  Outpatient Primary MD for the patient is [name], NP    LOS - 1  Admit date - 08/19/2020    Chief Complaint  Patient presents with  . Chest Pain       Brief Narrative     Logan Martin is a 65 y.o. male, with PMH of ESRD, HTN, HLD, CAD, history of adenocarcinoma of the bladder, anemia, thrombocytopenia, hx of PE, hepatitis C who presented to the ER on 08/19/2020 with general malaise, tachycardia.  His work-up was significant for anemia, as well he was noted to have melena, CTA abdomen pelvis with no source of GI bleed, he did require 2 units PRBC transfusion, seen by GI, with plan for colonoscopy tomorrow.     Subjective:    [name] today with no further episodes of bowel movements over last 24 hours, reports dyspnea and dizziness has improved, he denies any chest pain.       Assessment  & Plan :    Active Problems:   Atherosclerosis of native coronary artery of native heart without angina pectoris   ESRD on dialysis (HCC)   Anemia associated with chronic renal failure   Pancytopenia (HCC)   Peripheral vascular disease (HCC)   Tachycardia   Oral infection  Symptomatic anemia/acute blood loss anemia -With underlining anemia of chronic kidney disease -Secondary to GI bleed, given he is having significant melena. -Required 2 units of packed RBC transfusion, his hemoglobin has dropped 1 g overnight as well, so we will give another unit PRBC . -H&H  closely and transfuse as needed -Procrit and IV iron per renal  Acute blood loss  anemia/GI bleed -Required units PRBC transfusion. -CTA abdomen pelvis with no evidence of GI bleed, plan for colonoscopy, continue with Protonix drip, clear liquid diet for now.  ESRD on hemodialysis -Renal consulted  Hyperkalemia -Secondary to ESRD -He is on rescheduled Lokelma, I will discontinue given potassium 3.7 today.  Oral infection -This appears to be resolved this morning with good oral hygiene, it was small underlying infection, discontinue Augmentin, continue with chlorhexidine mouthwash and good oral hygiene.    Essential hypertension -Hold medication given GI bleed.     SpO2: 100 % O2 Flow Rate (L/min): 2 L/min  Recent Labs  Lab 08/19/20 1855 08/19/20 1916 08/19/20 2125 08/20/20 0021 08/20/20 0404 08/21/20 0409  WBC  --   --  5.3  --  4.8 3.5*  PLT  --   --  124*  --  167 136*  AST  --   --   --   --  18 17  ALT  --   --   --   --  12 11  ALKPHOS  --   --   --   --  74 69  BILITOT  --   --   --   --  1.1 1.0  ALBUMIN  --   --   --   --  3.4* 3.0*  INR 1.1  --   --   --   --   --   LATICACIDVEN  --   --   --  0.7 1.6  --   SARSCOV2NAA  --  NEGATIVE  --   --   --   --        ABG     Component Value Date/Time   PHART 7.486 (H) 05/05/2020 0053   PCO2ART 37.2 05/05/2020 0053   PO2ART 82 (L) 05/05/2020 0053   HCO3 28.3 (H) 05/05/2020 0053   TCO2 30 05/05/2020 0053   ACIDBASEDEF 1.0 03/15/2020 1331   O2SAT 97.0 05/05/2020 0053      Condition - Extremely Guarded  Family Communication  :  full  Code Status :  Full  Consults  :  GI,renal  Procedures  :    Disposition Plan  :    Status is: Inpatient  Remains inpatient appropriate because:IV treatments appropriate due to intensity of illness or inability to take PO   Dispo: The patient is from: Home              Anticipated d/c is to: Home              Patient currently is not medically stable to d/c.   Difficult to place patient No      DVT Prophylaxis  :   SCDs   Lab Results   Component Value Date   PLT 136 (L) 08/21/2020    Diet :  Diet Order            Diet NPO time specified  Diet effective midnight           Diet clear liquid Room service appropriate? Yes; Fluid consistency: Thin  Diet effective now                  Inpatient Medications  Scheduled Meds: . sodium chloride   Intravenous Once  . atorvastatin  40 mg Oral Daily  . chlorhexidine  15 mL Mouth/Throat QID  . Chlorhexidine Gluconate Cloth  6  each Topical [id number]  . ferric citrate  420 mg Oral TID  . hydrOXYzine  50 mg Oral Q0600  . [START ON 08/23/2020] pantoprazole  40 mg Intravenous Q12H  . polyethylene glycol-electrolytes  4,000 mL Oral Once  . sodium zirconium cyclosilicate  10 g Oral Once  . sodium zirconium cyclosilicate  5 g Oral Daily  . traZODone  150 mg Oral QHS   Continuous Infusions: . pantoprozole (PROTONIX) infusion 8 mg/hr (08/20/20 1300)   PRN Meds:.acetaminophen **OR** acetaminophen, levalbuterol, lidocaine, methocarbamol, ondansetron **OR** ondansetron (ZOFRAN) IV  Antibiotics  :    Anti-infectives (From admission, onward)   Start     Dose/Rate Route Frequency Ordered Stop   08/20/20 0415  amoxicillin-clavulanate (AUGMENTIN) 500-125 MG per tablet 500 mg  Status:  Discontinued        1 tablet Oral Daily 08/20/20 0329 08/21/20 1258   08/19/20 2345  Ampicillin-Sulbactam (UNASYN) 3 g in sodium chloride 0.9 % 100 mL IVPB  Status:  Discontinued        3 g 200 mL/hr over 30 Minutes Intravenous  Once 08/19/20 2333 08/20/20 0329        Emeline Gins Elgergawy M.D on 08/21/2020 at 12:59 PM  To page go to [email or web address]   [facility] -  Office  [phone]    Objective:   Vitals:   08/21/20 0433 08/21/20 0751 08/21/20 1115 08/21/20 1137  BP: 116/80 124/74 (!) 146/75 134/72  Pulse: (!) 115 (!) 125 99 (!) 114  Resp: 20 20 18  (!) 24  Temp: 97.6 F (36.4 C) (!) 97.4 F (36.3 C) 97.8 F (36.6 C) 97.8 F (36.6 C)  TempSrc:  Oral Oral Oral  SpO2:  99% 100%  100%  Weight:      Height:        Wt Readings from Last 3 Encounters:  08/21/20 48.5 kg  05/07/20 45.1 kg  03/29/20 50.2 kg     Intake/Output Summary (Last 24 hours) at 08/21/2020 1259 Last data filed at 08/21/2020 0850 Gross per 24 hour  Intake 1913 ml  Output -  Net 1913 ml     Physical Exam  Awake Alert, Oriented X 3, No new F.N deficits, Normal affect Symmetrical Chest wall movement, Good air movement bilaterally, CTAB RRR,No Gallops,Rubs or new Murmurs, No Parasternal Heave +ve B.Sounds, Abd Soft, No tenderness, No rebound - guarding or rigidity. No Cyanosis, Clubbing or edema, No new Rash or bruise       Data Review:    CBC Recent Labs  Lab 08/19/20 2125 08/20/20 0404 08/20/20 1956 08/21/20 0409  WBC 5.3 4.8  --  3.5*  HGB 7.9* 6.4* 8.5* 7.6*  HCT 25.2* 19.9* 25.2* 21.9*  PLT 124* 167  --  136*  MCV 103.7* 101.5*  --  92.8  MCH 32.5 32.7  --  32.2  MCHC 31.3 32.2  --  34.7  RDW 15.9* 16.1*  --  19.3*    Recent Labs  Lab 08/19/20 1855 08/19/20 1916 08/20/20 0021 08/20/20 0404 08/20/20 1956 08/21/20 0409  NA 138  --   --  136 134* 135  K 5.2*  --   --  5.0 3.3* 3.7  CL 96*  --   --  97* 99 97*  CO2 29  --   --  23 29 28   GLUCOSE 90  --   --  93 95 80  BUN 89*  --   --  96* 30* 34*  CREATININE 9.27*  --   --  9.76* 4.61* 5.82*  CALCIUM 9.2  --   --  9.5 9.0 9.2  AST  --   --   --  18  --  17  ALT  --   --   --  12  --  11  ALKPHOS  --   --   --  74  --  69  BILITOT  --   --   --  1.1  --  1.0  ALBUMIN  --   --   --  3.4*  --  3.0*  MG  --  1.9  --   --   --   --   LATICACIDVEN  --   --  0.7 1.6  --   --   INR 1.1  --   --   --   --   --     ------------------------------------------------------------------------------------------------------------------ No results for input(s): CHOL, HDL, LDLCALC, TRIG, CHOLHDL, LDLDIRECT in the last 72 hours.  No results found for:  HGBA1C ------------------------------------------------------------------------------------------------------------------ No results for input(s): TSH, T4TOTAL, T3FREE, THYROIDAB in the last 72 hours.  Invalid input(s): FREET3  Cardiac Enzymes No results for input(s): CKMB, TROPONINI, MYOGLOBIN in the last 168 hours.  Invalid input(s): CK ------------------------------------------------------------------------------------------------------------------    Component Value Date/Time   BNP 3,725.8 (H) 05/05/2020 1602    Micro Results Recent Results (from the past 240 hour(s))  SARS CORONAVIRUS 2 (TAT 6-24 HRS) Nasopharyngeal Nasopharyngeal Swab     Status: None   Collection Time: 08/19/20  7:16 PM   Specimen: Nasopharyngeal Swab  Result Value Ref Range Status   SARS Coronavirus 2 NEGATIVE NEGATIVE Final    Comment: (NOTE) SARS-CoV-2 target nucleic acids are NOT DETECTED.  The SARS-CoV-2 RNA is generally detectable in upper and lower respiratory specimens during the acute phase of infection. Negative results do not preclude SARS-CoV-2 infection, do not rule out co-infections with other pathogens, and should not be used as the sole basis for treatment or other patient management decisions. Negative results must be combined with clinical observations, patient history, and epidemiological information. The expected result is Negative.  Fact Sheet for Patients: SugarRoll.be  Fact Sheet for Healthcare Providers: https://www.woods-mathews.com/  This test is not yet approved or cleared by the [place] FDA and  has been authorized for detection and/or diagnosis of SARS-CoV-2 by FDA under an Emergency Use Authorization (EUA). This EUA will remain  in effect (meaning this test can be used) for the duration of the COVID-19 declaration under Se ction 564(b)(1) of the Act, 21 U.S.C. section 360bbb-3(b)(1), unless the authorization is terminated  or revoked sooner.  Performed at [facility], [place] [address] [place], [place] [address]   Blood culture (routine x 2)     Status: None (Preliminary result)   Collection Time: 08/20/20 12:21 AM   Specimen: BLOOD  Result Value Ref Range Status   Specimen Description BLOOD SITE NOT SPECIFIED  Final   Special Requests   Final    BOTTLES DRAWN AEROBIC AND ANAEROBIC Blood Culture adequate volume   Culture   Final    NO GROWTH < 24 HOURS Performed at [facility], [place] [address], [place], [place] [address]    Report Status PENDING  Incomplete  Blood culture (routine x 2)     Status: None (Preliminary result)   Collection Time: 08/20/20  4:04 AM   Specimen: BLOOD  Result Value Ref Range Status   Specimen Description BLOOD SITE NOT SPECIFIED  Final   Special  Requests AEROBIC BOTTLE ONLY Blood Culture adequate volume  Final   Culture   Final    NO GROWTH < 12 HOURS Performed at [facility], [place] [address], [place], [place] [address]    Report Status PENDING  Incomplete    Radiology Reports DG Chest 2 View  Result Date: 08/19/2020 CLINICAL DATA:  Chest pain shortness of breath EXAM: CHEST - 2 VIEW COMPARISON:  Chest radiograph May 04, 2020 FINDINGS: Right subclavian/innominate stent, unchanged. Mild cardiac enlargement. Central vascular congestion. Mild basilar predominant interstitial opacities. The visualized skeletal structures are unremarkable. IMPRESSION: Mild cardiac enlargement with central vascular congestion and basilar predominant interstitial opacities, likely mild edema. Electronically Signed   By: Dahlia Bailiff MD   On: 08/19/2020 20:18   IR Veno/Jugular Right  Result Date: 08/20/2020 INDICATION: 65 year old with end-stage renal disease, hematochezia, anemia and poor venous access. Patient needs a central venous catheter. EXAM: PLACEMENT OF CENTRAL LINE WITH ULTRASOUND AND FLUOROSCOPIC GUIDANCE CENTRAL VENOGRAM COMPARISON:  None. MEDICATIONS: None  ANESTHESIA/SEDATION: None FLUOROSCOPY TIME:  3 minutes, 48 seconds, 19.1 mGy) COMPLICATIONS: None immediate. PROCEDURE: The procedure, risks, benefits, and alternatives were explained to the patient. Questions regarding the procedure were encouraged and answered. The patient understands and consents to the procedure. Right side of the neck was evaluated with ultrasound. Ultrasound confirmed a patent right internal jugular vein. Ultrasound image was saved for documentation. Right side of the neck was prepped and draped in sterile fashion. Maximal barrier sterile technique was utilized including caps, mask, sterile gowns, sterile gloves, sterile drape, hand hygiene and skin antiseptic. Skin was anesthetized with 1% lidocaine. 21 gauge needle was directed into the right internal jugular vein but a wire would not advance centrally. Micropuncture dilator set was placed. Contrast was injected for a venogram. 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 Microcatheter was removed with manual compression. Dressing was placed. Ultrasound confirmed a patent left internal jugular vein. Ultrasound image was saved for documentation. Left side of the neck was prepped and draped in sterile fashion. Maximal barrier sterile technique was utilized including caps, mask, sterile gowns, sterile gloves, sterile drape, hand hygiene and skin antiseptic. Skin was anesthetized using 1% lidocaine. Small incision was made. 21 gauge needle was directed into the left internal jugular vein with ultrasound guidance. Micropuncture dilator set was placed. Wire would not easily advance into the SVC due to the right innominate stent. Venogram was performed to confirm patency. Kumpe catheter and Glidewire were used to cannulate the SVC from the left jugular approach. A Platinum Plus wire was placed and the catheter was removed. Peel-away sheath was placed. A dual lumen Power PICC line was cut to 23 cm and advanced over the  wire into the SVC. Wire was removed. Both lumens aspirated and flushed well. Catheter was sutured to the skin. Dressing was placed. 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 Wire would not advance into the SVC from right jugular approach. 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 Central venogram from the left jugular approach confirmed patency of the left innominate vein but there is significant narrowing at the junction of the left innominate vein and SVC related to the right innominate vein stent. Eventually a Kumpe catheter and Glidewire were able to cannulate the SVC from a left jugular approach. The central venous catheter was positioned near the superior cavoatrial junction. IMPRESSION: Successful placement  of a central venous catheter using ultrasound and fluoroscopic guidance. Catheter was placed from a left jugular approach. 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 Right jugular vein is patent in the neck but the lower portion of the right jugular vein is occluded from the right innominate vein stent. Patient is not a candidate for future right jugular central venous access. Electronically Signed   By: Markus Daft M.D.   On: 08/20/2020 15:15   IR Fluoro Guide CV Line Left  Result Date: 08/20/2020 INDICATION: 65 year old with end-stage renal disease, hematochezia, anemia and poor venous access. Patient needs a central venous catheter. EXAM: PLACEMENT OF CENTRAL LINE WITH ULTRASOUND AND FLUOROSCOPIC GUIDANCE CENTRAL VENOGRAM COMPARISON:  None. MEDICATIONS: None ANESTHESIA/SEDATION: None FLUOROSCOPY TIME:  3 minutes, 48 seconds, 63.8 mGy) COMPLICATIONS: None immediate. PROCEDURE: The procedure, risks, benefits, and alternatives were explained to the patient. Questions regarding the procedure were  encouraged and answered. The patient understands and consents to the procedure. Right side of the neck was evaluated with ultrasound. Ultrasound confirmed a patent right internal jugular vein. Ultrasound image was saved for documentation. Right side of the neck was prepped and draped in sterile fashion. Maximal barrier sterile technique was utilized including caps, mask, sterile gowns, sterile gloves, sterile drape, hand hygiene and skin antiseptic. Skin was anesthetized with 1% lidocaine. 21 gauge needle was directed into the right internal jugular vein but a wire would not advance centrally. Micropuncture dilator set was placed. Contrast was injected for a venogram. 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 Microcatheter was removed with manual compression. Dressing was placed. Ultrasound confirmed a patent left internal jugular vein. Ultrasound image was saved for documentation. Left side of the neck was prepped and draped in sterile fashion. Maximal barrier sterile technique was utilized including caps, mask, sterile gowns, sterile gloves, sterile drape, hand hygiene and skin antiseptic. Skin was anesthetized using 1% lidocaine. Small incision was made. 21 gauge needle was directed into the left internal jugular vein with ultrasound guidance. Micropuncture dilator set was placed. Wire would not easily advance into the SVC due to the right innominate stent. Venogram was performed to confirm patency. Kumpe catheter and Glidewire were used to cannulate the SVC from the left jugular approach. A Platinum Plus wire was placed and the catheter was removed. Peel-away sheath was placed. A dual lumen Power PICC line was cut to 23 cm and advanced over the wire into the SVC. Wire was removed. Both lumens aspirated and flushed well. Catheter was sutured to the skin. Dressing was placed. 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 Wire would not advance into the SVC from right  jugular approach. 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 Central venogram from the left jugular approach confirmed patency of the left innominate vein but there is significant narrowing at the junction of the left innominate vein and SVC related to the right innominate vein stent. Eventually a Kumpe catheter and Glidewire were able to cannulate the SVC from a left jugular approach. The central venous catheter was positioned near the superior cavoatrial junction. IMPRESSION: Successful placement of a central venous catheter using ultrasound and fluoroscopic guidance. Catheter was placed from a left jugular approach. 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 Right jugular vein is patent in the neck but the lower portion of the right jugular vein is occluded from the right innominate vein stent. Patient is not a candidate for future right jugular central venous access. Electronically Signed   By: Markus Daft M.D.   On: 08/20/2020 15:15   IR US Guide Vasc Access Left  Result Date: 08/20/2020 INDICATION: 65 year old with end-stage renal disease, hematochezia, anemia and poor venous access. Patient needs a central venous catheter. EXAM: PLACEMENT OF CENTRAL LINE WITH ULTRASOUND AND FLUOROSCOPIC GUIDANCE CENTRAL VENOGRAM COMPARISON:  None. MEDICATIONS: None ANESTHESIA/SEDATION: None FLUOROSCOPY TIME:  3 minutes, 48 seconds, 53.9 mGy) COMPLICATIONS: None immediate. PROCEDURE: The procedure, risks, benefits, and alternatives were explained to the patient. Questions regarding the procedure were encouraged and answered. The patient understands and consents to the procedure. Right side of the neck was evaluated with ultrasound. Ultrasound confirmed a patent right internal jugular vein. Ultrasound image was saved for documentation. Right  side of the neck was prepped and draped in sterile fashion. Maximal barrier sterile technique was utilized including caps, mask, sterile gowns, sterile gloves, sterile drape, hand hygiene and skin antiseptic. Skin was anesthetized with 1% lidocaine. 21 gauge needle was directed into the right internal jugular vein but a wire would not advance centrally. Micropuncture dilator set was placed. Contrast was injected for a venogram. 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 Microcatheter was removed with manual compression. Dressing was placed. Ultrasound confirmed a patent left internal jugular vein. Ultrasound image was saved for documentation. Left side of the neck was prepped and draped in sterile fashion. Maximal barrier sterile technique was utilized including caps, mask, sterile gowns, sterile gloves, sterile drape, hand hygiene and skin antiseptic. Skin was anesthetized using 1% lidocaine. Small incision was made. 21 gauge needle was directed into the left internal jugular vein with ultrasound guidance. Micropuncture dilator set was placed. Wire would not easily advance into the SVC due to the right innominate stent. Venogram was performed to confirm patency. Kumpe catheter and Glidewire were used to cannulate the SVC from the left jugular approach. A Platinum Plus wire was placed and the catheter was removed. Peel-away sheath was placed. A dual lumen Power PICC line was cut to 23 cm and advanced over the wire into the SVC. Wire was removed. Both lumens aspirated and flushed well. Catheter was sutured to the skin. Dressing was placed. 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 Wire would not advance into the SVC from right jugular approach. 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 Central venogram from the left jugular approach confirmed patency of the left  innominate vein but there is significant narrowing at the junction of the left innominate vein and SVC related to the right innominate vein stent. Eventually a Kumpe catheter and Glidewire were able to cannulate the SVC from a left jugular approach. The central venous catheter was positioned near the superior cavoatrial junction. IMPRESSION: Successful placement of a central venous catheter using ultrasound and fluoroscopic guidance. Catheter was placed from a left jugular approach. 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 Right jugular vein is patent in the neck but the lower portion of the right jugular vein is occluded from the right innominate vein stent. Patient is not a candidate for future right jugular central  venous access. Electronically Signed   By: Markus Daft M.D.   On: 08/20/2020 15:15   IR US Guide Vasc Access Right  Result Date: 08/20/2020 INDICATION: 65 year old with end-stage renal disease, hematochezia, anemia and poor venous access. Patient needs a central venous catheter. EXAM: PLACEMENT OF CENTRAL LINE WITH ULTRASOUND AND FLUOROSCOPIC GUIDANCE CENTRAL VENOGRAM COMPARISON:  None. MEDICATIONS: None ANESTHESIA/SEDATION: None FLUOROSCOPY TIME:  3 minutes, 48 seconds, 78.6 mGy) COMPLICATIONS: None immediate. PROCEDURE: The procedure, risks, benefits, and alternatives were explained to the patient. Questions regarding the procedure were encouraged and answered. The patient understands and consents to the procedure. Right side of the neck was evaluated with ultrasound. Ultrasound confirmed a patent right internal jugular vein. Ultrasound image was saved for documentation. Right side of the neck was prepped and draped in sterile fashion. Maximal barrier sterile technique was utilized including caps, mask, sterile gowns, sterile gloves, sterile drape, hand hygiene and skin antiseptic. Skin was anesthetized with 1%  lidocaine. 21 gauge needle was directed into the right internal jugular vein but a wire would not advance centrally. Micropuncture dilator set was placed. Contrast was injected for a venogram. 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 Microcatheter was removed with manual compression. Dressing was placed. Ultrasound confirmed a patent left internal jugular vein. Ultrasound image was saved for documentation. Left side of the neck was prepped and draped in sterile fashion. Maximal barrier sterile technique was utilized including caps, mask, sterile gowns, sterile gloves, sterile drape, hand hygiene and skin antiseptic. Skin was anesthetized using 1% lidocaine. Small incision was made. 21 gauge needle was directed into the left internal jugular vein with ultrasound guidance. Micropuncture dilator set was placed. Wire would not easily advance into the SVC due to the right innominate stent. Venogram was performed to confirm patency. Kumpe catheter and Glidewire were used to cannulate the SVC from the left jugular approach. A Platinum Plus wire was placed and the catheter was removed. Peel-away sheath was placed. A dual lumen Power PICC line was cut to 23 cm and advanced over the wire into the SVC. Wire was removed. Both lumens aspirated and flushed well. Catheter was sutured to the skin. Dressing was placed. 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 Wire would not advance into the SVC from right jugular approach. 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 Central venogram from the left jugular approach confirmed patency of the left innominate vein but there is significant narrowing at the junction of the left innominate vein and SVC related to the right innominate vein stent. Eventually a Kumpe catheter and Glidewire were able to cannulate the SVC from a left jugular  approach. The central venous catheter was positioned near the superior cavoatrial junction. IMPRESSION: Successful placement of a central venous catheter using ultrasound and fluoroscopic guidance. Catheter was placed from a left jugular approach. 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 Right jugular vein is patent in the neck but the lower portion of the right jugular vein is occluded from the right innominate vein stent. Patient is not a candidate for future right jugular central venous access. Electronically Signed   By: Markus Daft M.D.   On: 08/20/2020 15:15   CT Angio Abd/Pel w/ and/or w/o  Result Date: 08/21/2020 CLINICAL DATA:  Melena, gastrointestinal bleeding EXAM: CTA ABDOMEN  AND PELVIS WITHOUT AND WITH CONTRAST TECHNIQUE: Multidetector CT imaging of the abdomen and pelvis was performed using the standard protocol during bolus administration of intravenous contrast. Multiplanar reconstructed images and MIPs were obtained and reviewed to evaluate the vascular anatomy. CONTRAST:  154mL OMNIPAQUE IOHEXOL 350 MG/ML SOLN COMPARISON:  03/27/2018 FINDINGS: VASCULAR Aorta: Normal caliber aorta without aneurysm, dissection, vasculitis or significant stenosis. Diffuse atherosclerosis. Celiac: Patent without evidence of aneurysm, dissection, vasculitis or significant stenosis. SMA: The SMA is patent at its origin without focal stenosis, aneurysm, or dissection. Distal branches of the SMA supplying the bowel in the right lower quadrant demonstrates significant atheromatous plaque and multifocal high-grade stenoses. Renals: There is high-grade stenosis at the origin of the left renal artery. Moderate stenosis is seen within the origin of the right renal artery. No dissection, aneurysm, or fibromuscular dysplasia. IMA: Patent without evidence of aneurysm, dissection, vasculitis or significant stenosis. Significant atherosclerosis.  Inflow: There is significant atheromatous plaque within the bilateral common iliac, external iliac, and internal iliac arteries. No high-grade stenosis. Proximal Outflow: There is extensive atherosclerosis throughout the visualized portions of the common femoral and superficial femoral arteries. No high-grade stenosis. Veins: No obvious venous abnormality within the limitations of this arterial phase study. Review of the MIP images confirms the above findings. NON-VASCULAR Lower chest: No acute pleural or parenchymal lung disease. Scattered emphysematous changes are noted. Hepatobiliary: Numerous cysts are seen throughout the liver. No other focal abnormalities. The gallbladder is unremarkable. Pancreas: Unremarkable. No pancreatic ductal dilatation or surrounding inflammatory changes. Spleen: Normal in size without focal abnormality. Adrenals/Urinary Tract: Extensive bilateral renal cortical atrophy with diffuse cystic changes consistent with history of end-stage renal disease. Adrenals are unremarkable. The bladder is decompressed. Stomach/Bowel: No bowel obstruction or ileus. No bowel wall thickening or inflammatory change. No evidence of contrast accumulation within the bowel lumen to suggest active gastrointestinal hemorrhage. Lymphatic: No pathologic adenopathy. Reproductive: Surgical clips are seen within the prostate bed. Other: No free fluid or free gas.  No abdominal wall hernia. Musculoskeletal: Diffuse changes of renal osteodystrophy. No acute bony abnormalities. Reconstructed images demonstrate no additional findings. IMPRESSION: VASCULAR 1. No evidence of active gastrointestinal hemorrhage. 2. Extensive atherosclerosis of the aorta and its branches. Focal areas of high-grade stenosis within distal branches of the SMA in the right lower quadrant. High-grade bilateral renal artery stenosis, left greater than right. NON-VASCULAR 1. Numerous bilateral hepatic cysts unchanged. 2. Bilateral renal cortical  atrophy with diffuse cystic change, consistent with known end-stage renal disease. 3. No acute intra-abdominal or intrapelvic process. 4. Aortic Atherosclerosis (ICD10-I70.0) and Emphysema (ICD10-J43.9). Electronically Signed   By: Randa Ngo M.D.   On: 08/21/2020 00:02

## 2020-08-21 NOTE — Plan of Care (Signed)

## 2020-08-21 NOTE — Progress Notes (Signed)
   08/21/20 1137  Assess: MEWS Score  MEWS RR 1

## 2020-08-21 NOTE — H&P (View-Only) (Signed)
Subjective: No acute events.  Objective: Vital signs in last 24 hours: Temp:  [97.6 F (36.4 C)-98.7 F (37.1 C)] 97.9 F (36.6 C) (03/27 0320) Pulse Rate:  [80-118] 91 (03/26 1758) Resp:  [11-26] 12 (03/27 0320) BP: (126-179)/(46-140) 138/67 (03/27 0320) SpO2:  [96 %-100 %] 100 % (03/27 0320) Weight:  [48.3 kg-50.1 kg] 48.5 kg (03/27 0011) Last BM Date: 08/20/20  Intake/Output from previous day: 03/26 0701 - 03/27 0700 In: 1435 [P.O.:120; I.V.:500; Blood:815] Out: -  Intake/Output this shift: No intake/output data recorded.  General appearance: snoring, soundly sleeping Resp: clear to auscultation bilaterally Cardio: regular rate and rhythm GI: soft, non-tender; bowel sounds normal; no masses,  no organomegaly Extremities: extremities normal, atraumatic, no cyanosis or edema  Lab Results: Recent Labs    08/19/20 2125 08/20/20 0404 08/20/20 1956 08/21/20 0409  WBC 5.3 4.8  --  3.5*  HGB 7.9* 6.4* 8.5* 7.6*  HCT 25.2* 19.9* 25.2* 21.9*  PLT 124* 167  --  136*   BMET Recent Labs    08/20/20 0404 08/20/20 1956 08/21/20 0409  NA 136 134* 135  K 5.0 3.3* 3.7  CL 97* 99 97*  CO2 23 29 28   GLUCOSE 93 95 80  BUN 96* 30* 34*  CREATININE 9.76* 4.61* 5.82*  CALCIUM 9.5 9.0 9.2   LFT Recent Labs    08/21/20 0409  PROT 5.5*  ALBUMIN 3.0*  AST 17  ALT 11  ALKPHOS 69  BILITOT 1.0   PT/INR Recent Labs    08/19/20 1855  LABPROT 13.8  INR 1.1   Hepatitis Panel Recent Labs    08/20/20 1956  HEPBSAG NON REACTIVE   C-Diff No results for input(s): CDIFFTOX in the last 72 hours. Fecal Lactopherrin No results for input(s): FECLLACTOFRN in the last 72 hours.  Studies/Results: DG Chest 2 View  Result Date: 08/19/2020 CLINICAL DATA:  Chest pain shortness of breath EXAM: CHEST - 2 VIEW COMPARISON:  Chest radiograph May 04, 2020 FINDINGS: Right subclavian/innominate stent, unchanged. Mild cardiac enlargement. Central vascular congestion. Mild basilar  predominant interstitial opacities. The visualized skeletal structures are unremarkable. IMPRESSION: Mild cardiac enlargement with central vascular congestion and basilar predominant interstitial opacities, likely mild edema. Electronically Signed   By: Dahlia Bailiff MD   On: 08/19/2020 20:18   IR Veno/Jugular Right  Result Date: 08/20/2020 INDICATION: 65 year old with end-stage renal disease, hematochezia, anemia and poor venous access. Patient needs a central venous catheter. EXAM: PLACEMENT OF CENTRAL LINE WITH ULTRASOUND AND FLUOROSCOPIC GUIDANCE CENTRAL VENOGRAM COMPARISON:  None. MEDICATIONS: None ANESTHESIA/SEDATION: None FLUOROSCOPY TIME:  3 minutes, 48 seconds, 40.8 mGy) COMPLICATIONS: None immediate. PROCEDURE: The procedure, risks, benefits, and alternatives were explained to the patient. Questions regarding the procedure were encouraged and answered. The patient understands and consents to the procedure. Right side of the neck was evaluated with ultrasound. Ultrasound confirmed a patent right internal jugular vein. Ultrasound image was saved for documentation. Right side of the neck was prepped and draped in sterile fashion. Maximal barrier sterile technique was utilized including caps, mask, sterile gowns, sterile gloves, sterile drape, hand hygiene and skin antiseptic. Skin was anesthetized with 1% lidocaine. 21 gauge needle was directed into the right internal jugular vein but a wire would not advance centrally. Micropuncture dilator set was placed. Contrast was injected for a venogram. 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 Microcatheter was removed with manual compression. Dressing was placed. Ultrasound confirmed a patent left internal jugular vein. Ultrasound image was saved  for documentation. Left side of the neck was prepped and draped in sterile fashion. Maximal barrier sterile technique was utilized including caps, mask, sterile gowns, sterile gloves,  sterile drape, hand hygiene and skin antiseptic. Skin was anesthetized using 1% lidocaine. Small incision was made. 21 gauge needle was directed into the left internal jugular vein with ultrasound guidance. Micropuncture dilator set was placed. Wire would not easily advance into the SVC due to the right innominate stent. Venogram was performed to confirm patency. Kumpe catheter and Glidewire were used to cannulate the SVC from the left jugular approach. A Platinum Plus wire was placed and the catheter was removed. Peel-away sheath was placed. A dual lumen Power PICC line was cut to 23 cm and advanced over the wire into the SVC. Wire was removed. Both lumens aspirated and flushed well. Catheter was sutured to the skin. Dressing was placed. 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 Wire would not advance into the SVC from right jugular approach. 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 Central venogram from the left jugular approach confirmed patency of the left innominate vein but there is significant narrowing at the junction of the left innominate vein and SVC related to the right innominate vein stent. Eventually a Kumpe catheter and Glidewire were able to cannulate the SVC from a left jugular approach. The central venous catheter was positioned near the superior cavoatrial junction. IMPRESSION: Successful placement of a central venous catheter using ultrasound and fluoroscopic guidance. Catheter was placed from a left jugular approach. 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 Right jugular vein is patent in the neck but the lower portion of the right jugular vein is occluded from the right innominate vein stent. Patient is not a candidate for future right jugular central venous access. Electronically Signed    By: Markus Daft M.D.   On: 08/20/2020 15:15   IR Fluoro Guide CV Line Left  Result Date: 08/20/2020 INDICATION: 65 year old with end-stage renal disease, hematochezia, anemia and poor venous access. Patient needs a central venous catheter. EXAM: PLACEMENT OF CENTRAL LINE WITH ULTRASOUND AND FLUOROSCOPIC GUIDANCE CENTRAL VENOGRAM COMPARISON:  None. MEDICATIONS: None ANESTHESIA/SEDATION: None FLUOROSCOPY TIME:  3 minutes, 48 seconds, 53.6 mGy) COMPLICATIONS: None immediate. PROCEDURE: The procedure, risks, benefits, and alternatives were explained to the patient. Questions regarding the procedure were encouraged and answered. The patient understands and consents to the procedure. Right side of the neck was evaluated with ultrasound. Ultrasound confirmed a patent right internal jugular vein. Ultrasound image was saved for documentation. Right side of the neck was prepped and draped in sterile fashion. Maximal barrier sterile technique was utilized including caps, mask, sterile gowns, sterile gloves, sterile drape, hand hygiene and skin antiseptic. Skin was anesthetized with 1% lidocaine. 21 gauge needle was directed into the right internal jugular vein but a wire would not advance centrally. Micropuncture dilator set was placed. Contrast was injected for a venogram. 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 Microcatheter was removed with manual compression. Dressing was placed. Ultrasound confirmed a patent left internal jugular vein. Ultrasound image was saved for documentation. Left side of the neck was prepped and draped in sterile fashion. Maximal barrier sterile technique was utilized including caps, mask, sterile gowns, sterile gloves, sterile drape, hand hygiene and skin antiseptic. Skin was anesthetized  using 1% lidocaine. Small incision was made. 21 gauge needle was directed into the left internal jugular vein with ultrasound guidance. Micropuncture dilator set was placed.  Wire would not easily advance into the SVC due to the right innominate stent. Venogram was performed to confirm patency. Kumpe catheter and Glidewire were used to cannulate the SVC from the left jugular approach. A Platinum Plus wire was placed and the catheter was removed. Peel-away sheath was placed. A dual lumen Power PICC line was cut to 23 cm and advanced over the wire into the SVC. Wire was removed. Both lumens aspirated and flushed well. Catheter was sutured to the skin. Dressing was placed. 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 Wire would not advance into the SVC from right jugular approach. 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 Central venogram from the left jugular approach confirmed patency of the left innominate vein but there is significant narrowing at the junction of the left innominate vein and SVC related to the right innominate vein stent. Eventually a Kumpe catheter and Glidewire were able to cannulate the SVC from a left jugular approach. The central venous catheter was positioned near the superior cavoatrial junction. IMPRESSION: Successful placement of a central venous catheter using ultrasound and fluoroscopic guidance. Catheter was placed from a left jugular approach. 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 Right jugular vein is patent in the neck but the lower portion of the right jugular vein is occluded from the right innominate vein stent. Patient is not a candidate for future right jugular central venous access. Electronically Signed   By: Markus Daft M.D.   On: 08/20/2020 15:15   IR US Guide Vasc Access Left  Result Date: 08/20/2020 INDICATION: 65 year old with end-stage renal disease, hematochezia, anemia and poor venous access. Patient needs a central venous catheter.  EXAM: PLACEMENT OF CENTRAL LINE WITH ULTRASOUND AND FLUOROSCOPIC GUIDANCE CENTRAL VENOGRAM COMPARISON:  None. MEDICATIONS: None ANESTHESIA/SEDATION: None FLUOROSCOPY TIME:  3 minutes, 48 seconds, 84.6 mGy) COMPLICATIONS: None immediate. PROCEDURE: The procedure, risks, benefits, and alternatives were explained to the patient. Questions regarding the procedure were encouraged and answered. The patient understands and consents to the procedure. Right side of the neck was evaluated with ultrasound. Ultrasound confirmed a patent right internal jugular vein. Ultrasound image was saved for documentation. Right side of the neck was prepped and draped in sterile fashion. Maximal barrier sterile technique was utilized including caps, mask, sterile gowns, sterile gloves, sterile drape, hand hygiene and skin antiseptic. Skin was anesthetized with 1% lidocaine. 21 gauge needle was directed into the right internal jugular vein but a wire would not advance centrally. Micropuncture dilator set was placed. Contrast was injected for a venogram. 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 Microcatheter was removed with manual compression. Dressing was placed. Ultrasound confirmed a patent left internal jugular vein. Ultrasound image was saved for documentation. Left side of the neck was prepped and draped in sterile fashion. Maximal barrier sterile technique was utilized including caps, mask, sterile gowns, sterile gloves, sterile drape, hand hygiene and skin antiseptic. Skin was anesthetized using 1% lidocaine. Small incision was made. 21 gauge needle was directed into the left internal jugular vein with ultrasound guidance. Micropuncture dilator set was placed. Wire would not easily advance into the SVC due to the  right innominate stent. Venogram was performed to confirm patency. Kumpe catheter and Glidewire were used to cannulate the SVC from the left jugular approach. A Platinum Plus wire was placed  and the catheter was removed. Peel-away sheath was placed. A dual lumen Power PICC line was cut to 23 cm and advanced over the wire into the SVC. Wire was removed. Both lumens aspirated and flushed well. Catheter was sutured to the skin. Dressing was placed. 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 Wire would not advance into the SVC from right jugular approach. 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 Central venogram from the left jugular approach confirmed patency of the left innominate vein but there is significant narrowing at the junction of the left innominate vein and SVC related to the right innominate vein stent. Eventually a Kumpe catheter and Glidewire were able to cannulate the SVC from a left jugular approach. The central venous catheter was positioned near the superior cavoatrial junction. IMPRESSION: Successful placement of a central venous catheter using ultrasound and fluoroscopic guidance. Catheter was placed from a left jugular approach. 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 Right jugular vein is patent in the neck but the lower portion of the right jugular vein is occluded from the right innominate vein stent. Patient is not a candidate for future right jugular central venous access. Electronically Signed   By: Markus Daft M.D.   On: 08/20/2020 15:15   IR US Guide Vasc Access Right  Result Date: 08/20/2020 INDICATION: 65 year old with end-stage renal disease, hematochezia, anemia and poor venous access. Patient needs a central venous catheter. EXAM: PLACEMENT OF CENTRAL LINE WITH ULTRASOUND AND FLUOROSCOPIC GUIDANCE CENTRAL VENOGRAM COMPARISON:  None. MEDICATIONS: None ANESTHESIA/SEDATION: None FLUOROSCOPY TIME:  3 minutes, 48 seconds, 84.6 mGy) COMPLICATIONS: None immediate.  PROCEDURE: The procedure, risks, benefits, and alternatives were explained to the patient. Questions regarding the procedure were encouraged and answered. The patient understands and consents to the procedure. Right side of the neck was evaluated with ultrasound. Ultrasound confirmed a patent right internal jugular vein. Ultrasound image was saved for documentation. Right side of the neck was prepped and draped in sterile fashion. Maximal barrier sterile technique was utilized including caps, mask, sterile gowns, sterile gloves, sterile drape, hand hygiene and skin antiseptic. Skin was anesthetized with 1% lidocaine. 21 gauge needle was directed into the right internal jugular vein but a wire would not advance centrally. Micropuncture dilator set was placed. Contrast was injected for a venogram. 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 Microcatheter was removed with manual compression. Dressing was placed. Ultrasound confirmed a patent left internal jugular vein. Ultrasound image was saved for documentation. Left side of the neck was prepped and draped in sterile fashion. Maximal barrier sterile technique was utilized including caps, mask, sterile gowns, sterile gloves, sterile drape, hand hygiene and skin antiseptic. Skin was anesthetized using 1% lidocaine. Small incision was made. 21 gauge needle was directed into the left internal jugular vein with ultrasound guidance. Micropuncture dilator set was placed. Wire would not easily advance into the SVC due to the right innominate stent. Venogram was performed to confirm patency. Kumpe catheter and Glidewire were used to cannulate the SVC from the left jugular approach. A Platinum Plus wire was placed and the catheter was removed. Peel-away sheath  was placed. A dual lumen Power PICC line was cut to 23 cm and advanced over the wire into the SVC. Wire was removed. Both lumens aspirated and flushed well. Catheter was sutured to the skin.  Dressing was placed. 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 Wire would not advance into the SVC from right jugular approach. 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 Central venogram from the left jugular approach confirmed patency of the left innominate vein but there is significant narrowing at the junction of the left innominate vein and SVC related to the right innominate vein stent. Eventually a Kumpe catheter and Glidewire were able to cannulate the SVC from a left jugular approach. The central venous catheter was positioned near the superior cavoatrial junction. IMPRESSION: Successful placement of a central venous catheter using ultrasound and fluoroscopic guidance. Catheter was placed from a left jugular approach. 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 Right jugular vein is patent in the neck but the lower portion of the right jugular vein is occluded from the right innominate vein stent. Patient is not a candidate for future right jugular central venous access. Electronically Signed   By: Markus Daft M.D.   On: 08/20/2020 15:15   CT Angio Abd/Pel w/ and/or w/o  Result Date: 08/21/2020 CLINICAL DATA:  Melena, gastrointestinal bleeding EXAM: CTA ABDOMEN AND PELVIS WITHOUT AND WITH CONTRAST TECHNIQUE: Multidetector CT imaging of the abdomen and pelvis was performed using the standard protocol during bolus administration of intravenous contrast. Multiplanar reconstructed images and MIPs were obtained and reviewed to evaluate the vascular anatomy. CONTRAST:  169mL OMNIPAQUE IOHEXOL 350 MG/ML SOLN COMPARISON:  03/27/2018 FINDINGS: VASCULAR Aorta: Normal caliber aorta without aneurysm, dissection, vasculitis or significant stenosis. Diffuse atherosclerosis. Celiac: Patent without evidence of aneurysm,  dissection, vasculitis or significant stenosis. SMA: The SMA is patent at its origin without focal stenosis, aneurysm, or dissection. Distal branches of the SMA supplying the bowel in the right lower quadrant demonstrates significant atheromatous plaque and multifocal high-grade stenoses. Renals: There is high-grade stenosis at the origin of the left renal artery. Moderate stenosis is seen within the origin of the right renal artery. No dissection, aneurysm, or fibromuscular dysplasia. IMA: Patent without evidence of aneurysm, dissection, vasculitis or significant stenosis. Significant atherosclerosis. Inflow: There is significant atheromatous plaque within the bilateral common iliac, external iliac, and internal iliac arteries. No high-grade stenosis. Proximal Outflow: There is extensive atherosclerosis throughout the visualized portions of the common femoral and superficial femoral arteries. No high-grade stenosis. Veins: No obvious venous abnormality within the limitations of this arterial phase study. Review of the MIP images confirms the above findings. NON-VASCULAR Lower chest: No acute pleural or parenchymal lung disease. Scattered emphysematous changes are noted. Hepatobiliary: Numerous cysts are seen throughout the liver. No other focal abnormalities. The gallbladder is unremarkable. Pancreas: Unremarkable. No pancreatic ductal dilatation or surrounding inflammatory changes. Spleen: Normal in size without focal abnormality. Adrenals/Urinary Tract: Extensive bilateral renal cortical atrophy with diffuse cystic changes consistent with history of end-stage renal disease. Adrenals are unremarkable. The bladder is decompressed. Stomach/Bowel: No bowel obstruction or ileus. No bowel wall thickening or inflammatory change. No evidence of contrast accumulation within the bowel lumen to suggest active gastrointestinal hemorrhage. Lymphatic: No pathologic adenopathy. Reproductive: Surgical clips are seen within the  prostate bed. Other: No free fluid  or free gas.  No abdominal wall hernia. Musculoskeletal: Diffuse changes of renal osteodystrophy. No acute bony abnormalities. Reconstructed images demonstrate no additional findings. IMPRESSION: VASCULAR 1. No evidence of active gastrointestinal hemorrhage. 2. Extensive atherosclerosis of the aorta and its branches. Focal areas of high-grade stenosis within distal branches of the SMA in the right lower quadrant. High-grade bilateral renal artery stenosis, left greater than right. NON-VASCULAR 1. Numerous bilateral hepatic cysts unchanged. 2. Bilateral renal cortical atrophy with diffuse cystic change, consistent with known end-stage renal disease. 3. No acute intra-abdominal or intrapelvic process. 4. Aortic Atherosclerosis (ICD10-I70.0) and Emphysema (ICD10-J43.9). Electronically Signed   By: Randa Ngo M.D.   On: 08/21/2020 00:02    Medications:  Scheduled: . sodium chloride   Intravenous Once  . amoxicillin-clavulanate  1 tablet Oral Daily  . atorvastatin  40 mg Oral Daily  . chlorhexidine  15 mL Mouth/Throat QID  . Chlorhexidine Gluconate Cloth  6 each Topical Q0600  . ferric citrate  420 mg Oral TID  . hydrOXYzine  50 mg Oral Q0600  . [START ON 08/23/2020] pantoprazole  40 mg Intravenous Q12H  . sodium zirconium cyclosilicate  10 g Oral Once  . sodium zirconium cyclosilicate  5 g Oral Daily  . traZODone  150 mg Oral QHS   Continuous: . pantoprozole (PROTONIX) infusion 8 mg/hr (08/20/20 1300)    Assessment/Plan: 1) Hematochezia. 2) Anemia.   The patient did not have any acute events over night.  NO further documentaiton of any bleeding.  He did received blood transfusions.  The CTA was negative for any overt bleeding source, but he does have severe atherosclerosis.  Plan: 1) Colonoscopy tomorrow. 2) Follow HGB and transfuse as necessary.  LOS: 1 day   Portia Wisdom D 08/21/2020, 7:27 AM

## 2020-08-21 NOTE — Progress Notes (Signed)
Notified the covering provider that the patient's heart rate has been sustaining at 115. He remained asymptomatic. Awaiting for Md to respond.

## 2020-08-22 ENCOUNTER — Inpatient Hospital Stay (HOSPITAL_COMMUNITY): Payer: Medicare HMO

## 2020-08-22 ENCOUNTER — Inpatient Hospital Stay (HOSPITAL_COMMUNITY): Payer: Medicare HMO | Admitting: Anesthesiology

## 2020-08-22 ENCOUNTER — Encounter (HOSPITAL_COMMUNITY): Payer: Self-pay | Admitting: Internal Medicine

## 2020-08-22 ENCOUNTER — Encounter (HOSPITAL_COMMUNITY)
Admission: EM | Discharge status: Against Medical Advice | Payer: Self-pay | Source: Home / Self Care | Attending: Internal Medicine

## 2020-08-22 DIAGNOSIS — K921 Melena: Secondary | ICD-10-CM

## 2020-08-22 DIAGNOSIS — K635 Polyp of colon: Secondary | ICD-10-CM | POA: Diagnosis not present

## 2020-08-22 DIAGNOSIS — N186 End stage renal disease: Secondary | ICD-10-CM | POA: Diagnosis not present

## 2020-08-22 DIAGNOSIS — R079 Chest pain, unspecified: Secondary | ICD-10-CM

## 2020-08-22 DIAGNOSIS — K122 Cellulitis and abscess of mouth: Secondary | ICD-10-CM | POA: Diagnosis not present

## 2020-08-22 DIAGNOSIS — N189 Chronic kidney disease, unspecified: Secondary | ICD-10-CM | POA: Diagnosis not present

## 2020-08-22 HISTORY — PX: COLONOSCOPY WITH PROPOFOL: SHX5780

## 2020-08-22 HISTORY — PX: POLYPECTOMY: SHX5525

## 2020-08-22 LAB — CBC
HCT: 26.1 % — ABNORMAL LOW (ref 39.0–52.0)
Hemoglobin: 9 g/dL — ABNORMAL LOW (ref 13.0–17.0)
MCH: 32 pg (ref 26.0–34.0)
MCHC: 34.5 g/dL (ref 30.0–36.0)
MCV: 92.9 fL (ref 80.0–100.0)
Platelets: 123 10*3/uL — ABNORMAL LOW (ref 150–400)
RBC: 2.81 MIL/uL — ABNORMAL LOW (ref 4.22–5.81)
RDW: 17.9 % — ABNORMAL HIGH (ref 11.5–15.5)
WBC: 3.8 10*3/uL — ABNORMAL LOW (ref 4.0–10.5)
nRBC: 0 % (ref 0.0–0.2)

## 2020-08-22 LAB — BASIC METABOLIC PANEL
Anion gap: 11 (ref 5–15)
BUN: 38 mg/dL — ABNORMAL HIGH (ref 8–23)
CO2: 26 mmol/L (ref 22–32)
Calcium: 9.3 mg/dL (ref 8.9–10.3)
Chloride: 94 mmol/L — ABNORMAL LOW (ref 98–111)
Creatinine, Ser: 7.98 mg/dL — ABNORMAL HIGH (ref 0.61–1.24)
GFR, Estimated: 7 mL/min — ABNORMAL LOW (ref >60)
Glucose, Bld: 78 mg/dL (ref 70–99)
Potassium: 4.1 mmol/L (ref 3.5–5.1)
Sodium: 131 mmol/L — ABNORMAL LOW (ref 135–145)

## 2020-08-22 LAB — TROPONIN I (HIGH SENSITIVITY)
Troponin I (High Sensitivity): 39 ng/L — ABNORMAL HIGH (ref <18)
Troponin I (High Sensitivity): 38 ng/L — ABNORMAL HIGH (ref <18)

## 2020-08-22 LAB — HEPATITIS B SURFACE ANTIBODY, QUANTITATIVE: Hep B S AB Quant (Post): 132.7 m[IU]/mL (ref >9.9)

## 2020-08-22 SURGERY — COLONOSCOPY WITH PROPOFOL
Anesthesia: General

## 2020-08-22 MED ORDER — PROPOFOL 500 MG/50ML IV EMUL
INTRAVENOUS | Status: DC | PRN
  Administered 2020-08-22: 75 ug/kg/min via INTRAVENOUS

## 2020-08-22 MED ORDER — SUCCINYLCHOLINE CHLORIDE 200 MG/10ML IV SOSY
PREFILLED_SYRINGE | INTRAVENOUS | Status: DC | PRN
  Administered 2020-08-22: 80 mg via INTRAVENOUS

## 2020-08-22 MED ORDER — PROPOFOL 10 MG/ML IV BOLUS
INTRAVENOUS | Status: DC | PRN
  Administered 2020-08-22: 50 mg via INTRAVENOUS
  Administered 2020-08-22: 20 mg via INTRAVENOUS

## 2020-08-22 MED ORDER — FUROSEMIDE 10 MG/ML IJ SOLN: 40.0000 mg | INTRAMUSCULAR | Status: DC

## 2020-08-22 MED ORDER — NITROGLYCERIN 0.4 MG SL SUBL
0.4000 mg | SUBLINGUAL_TABLET | SUBLINGUAL | Status: DC | PRN
  Administered 2020-08-22 (×2): 0.4 mg via SUBLINGUAL
  Filled 2020-08-22 (×3): qty 1

## 2020-08-22 MED ORDER — FENTANYL CITRATE (PF) 100 MCG/2ML IJ SOLN
INTRAMUSCULAR | Status: AC
  Administered 2020-08-22: 25 ug via INTRAVENOUS
  Filled 2020-08-22: qty 2

## 2020-08-22 MED ORDER — FENTANYL CITRATE (PF) 100 MCG/2ML IJ SOLN: 25.0000 ug | INTRAMUSCULAR | Status: DC | PRN

## 2020-08-22 MED ORDER — SODIUM CHLORIDE 0.9 % IV SOLN: INTRAVENOUS | Status: DC

## 2020-08-22 SURGICAL SUPPLY — 21 items

## 2020-08-22 NOTE — Anesthesia Procedure Notes (Signed)
Procedure Name: Intubation Date/Time: 08/22/2020 10:36 AM Performed by: Darletta Moll, CRNA Pre-anesthesia Checklist: Patient identified, Emergency Drugs available, Suction available and Patient being monitored Patient Re-evaluated:Patient Re-evaluated prior to induction Oxygen Delivery Method: Circle system utilized Preoxygenation: Pre-oxygenation with 100% oxygen Induction Type: IV induction and Rapid sequence Laryngoscope Size: Mac and 3 Grade View: Grade I Tube type: Oral Tube size: 8.0 mm Number of attempts: 1 Airway Equipment and Method: Stylet and Oral airway Placement Confirmation: ETT inserted through vocal cords under direct vision,  positive ETCO2 and breath sounds checked- equal and bilateral Secured at: 22 cm Tube secured with: Tape Dental Injury: Teeth and Oropharynx as per pre-operative assessment  Comments: DLx1 by Hassan Rowan CRNA placed in esophagus. DLx1 Dr. Therisa Doyne successful intubation

## 2020-08-22 NOTE — Interval H&P Note (Signed)
History and Physical Interval Note:  08/22/2020 10:06 AM  Logan Martin  has presented today for surgery, with the diagnosis of Hematochezia.  The various methods of treatment have been discussed with the patient and family. After consideration of risks, benefits and other options for treatment, the patient has consented to  Procedure(s): COLONOSCOPY WITH PROPOFOL (N/A) as a surgical intervention.  The patient's history has been reviewed, patient examined, no change in status, stable for surgery.  I have reviewed the patient's chart and labs.  Questions were answered to the patient's satisfaction.     Jackquline Denmark

## 2020-08-22 NOTE — Anesthesia Procedure Notes (Signed)
Procedure Name: MAC Date/Time: 08/22/2020 10:12 AM Performed by: Darletta Moll, CRNA Pre-anesthesia Checklist: Patient identified, Emergency Drugs available, Suction available and Patient being monitored Patient Re-evaluated:Patient Re-evaluated prior to induction Oxygen Delivery Method: Nasal cannula

## 2020-08-22 NOTE — Transfer of Care (Signed)
Immediate Anesthesia Transfer of Care Note  Patient: Logan Martin  Procedure(s) Performed: COLONOSCOPY WITH PROPOFOL (N/A ) POLYPECTOMY  Patient Location: PACU  Anesthesia Type:General  Level of Consciousness: drowsy and patient cooperative  Airway & Oxygen Therapy: Patient Spontanous Breathing and Patient connected to nasal cannula oxygen  Post-op Assessment: Report given to RN, Post -op Vital signs reviewed and stable and Patient moving all extremities X 4  Post vital signs: Reviewed and stable  Last Vitals:  Vitals Value Taken Time  BP 116/66 08/22/20 1126  Temp    Pulse 82 08/22/20 1129  Resp 16 08/22/20 1129  SpO2 99 % 08/22/20 1129  Vitals shown include unvalidated device data.  Last Pain:  Vitals:   08/22/20 0930  TempSrc: Oral  PainSc: 0-No pain         Complications: No complications documented.

## 2020-08-22 NOTE — Progress Notes (Signed)
   08/22/20 2111  Assess: MEWS Score  Temp (!) 97.5 F (36.4 C)  BP 102/78  Pulse Rate (!) 102  Resp 12  SpO2 100 %  O2 Device Nasal Cannula  O2 Flow Rate (L/min) 4 L/min  Assess: MEWS Score  MEWS Temp 0  MEWS Systolic 0  MEWS Pulse 1  MEWS RR 1  MEWS LOC 0  MEWS Score 2  MEWS Score Color Yellow  Assess: if the MEWS score is Yellow or Red  Were vital signs taken at a resting state? Yes  Focused Assessment No change from prior assessment  Early Detection of Sepsis Score *See Row Information* Medium  MEWS guidelines implemented *See Row Information* No, previously yellow, continue vital signs every 4 hours  Treat  MEWS Interventions Administered scheduled meds/treatments  Pain Scale 0-10  Pain Score Asleep  Notify: Charge Nurse/RN  Name of Charge Nurse/RN Notified Santiago Glad RN  Date Charge Nurse/RN Notified 08/22/20  Time Charge Nurse/RN Notified 2120  Document  Patient Outcome Not stable and remains on department  Progress note created (see row info) Yes

## 2020-08-22 NOTE — OR PostOp (Incomplete)
PACU TO INPATIENT HANDOFF REPORT  Name/Age/Gender Logan Martin 65 y.o. male  Code Status    Code Status Orders  (From admission, onward)         Start     Ordered   08/20/20 0101  Full code  Continuous        08/20/20 0105        Code Status History    Date Active Date Inactive Code Status Order ID Comments User Context   05/05/2020 0419 05/07/2020 2308 Full Code [id number]  Chotiner, Yevonne Aline, MD ED   03/10/2020 1313 03/17/2020 1644 Full Code [id number]  Mckinley Jewel, MD ED   06/17/2018 2105 06/18/2018 2012 Full Code [id number]  Ivor Costa, MD ED   03/27/2018 2039 03/28/2018 2014 Full Code [id number]  Franchot Gallo, MD Inpatient   11/21/2017 2039 11/22/2017 2224 Full Code [id number]  Bethena Roys, MD ED   10/14/2017 0044 10/15/2017 1409 Full Code [id number]  [place], Zortman, DO Inpatient   06/22/2016 1220 06/22/2016 1558 Full Code [id number]  Elam Dutch, MD Inpatient   06/14/2016 1946 06/17/2016 [place] Full Code [id number]  Jule Ser, DO ED   06/07/2013 0236 06/09/2013 1638 Full Code [id number]  Elgergawy, Silver Huguenin, MD Inpatient   05/10/2013 0607 05/10/2013 1704 Full Code [id number]  Theressa Millard, MD Inpatient   11/27/2012 1052 11/28/2012 1725 Full Code [id number]  Olga Millers, MD Inpatient   Advance Care Planning Activity      Home/SNF/Other {Discharge Destination:18313::"Home"}  Chief Complaint Pulmonary congestion [R09.89] Sinus tachycardia [R00.0] Tachycardia [R00.0] ESRD (end stage renal disease) on dialysis ([place]) [N18.6, Z99.2] Gingival abscess [K05.219]  Level of Care/Admitting Diagnosis ED Disposition    ED Disposition Condition [facility] Area: [place] [[id number]]  Level of Care: Progressive [102]  Admit to Progressive based on following criteria: CARDIOVASCULAR & THORACIC of moderate stability with acute coronary syndrome symptoms/low risk myocardial infarction/hypertensive urgency/arrhythmias/heart  failure potentially compromising stability and stable post cardiovascular intervention patients.  May admit patient to [facility] or [facility] if equivalent level of care is available:: No  Covid Evaluation: Symptomatic Person Under Investigation (PUI)  Diagnosis: Tachycardia [967591]  Admitting Physician: Doran Heater [[id number]]  Attending Physician: Doran Heater [[id number]]  Estimated length of stay: 3 - 4 days  Certification:: I certify this patient will need inpatient services for at least 2 midnights       Medical History Past Medical History:  Diagnosis Date  . Alcohol abuse    quit date in 1995  . Anemia   . Cancer ([place])    bladder  . Chronic low back pain   . Cocaine abuse in remission ([place])    Quit date in 1995  . ESRD (end stage renal disease) ([place])    Home Dialysis  . 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 Started HD in [place], [place].  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  On NxStage home hemo since 2010. Using RUA AVF.    [name] Hearing aid worn    B/L  . Hepatitis C    s/p treatment with Mavyret  . History of blood transfusion 2007 X 1  . History of kidney stones   . Hx of constipation   . Hypertension   . Left inguinal hernia   . Peripheral vascular disease ([place])   . Pulmonary embolism [facility]) Feb 2012   Treated with coumadin x 1 year  .  Wears glasses     Allergies Allergies  Allergen Reactions  . Heparin Rash    Pork products   . Pork-Derived Products Rash  . Sulfa Antibiotics Rash    IV Location/Drains/Wounds Patient Lines/Drains/Airways Status    Active Line/Drains/Airways    Name Placement date Placement time Site Days   PICC Double Lumen 08/20/20 PICC Left  23 cm 0 cm 08/20/20  1132  - 2   Fistula / Graft Right Upper arm -  -  Upper arm  -   Fistula / Graft Right Upper arm Arteriovenous fistula -  -  Upper arm  -          Labs/Imaging Results for orders placed or performed during  the hospital encounter of 08/19/20 (from the past 48 hour(s))  Hepatitis B surface antigen     Status: None   Collection Time: 08/20/20  7:56 PM  Result Value Ref Range   Hepatitis B Surface Ag NON REACTIVE NON REACTIVE    Comment: Performed at [facility] [address], [place], [place] [address]  Basic metabolic panel     Status: Abnormal   Collection Time: 08/20/20  7:56 PM  Result Value Ref Range   Sodium 134 (L) 135 - 145 mmol/L   Potassium 3.3 (L) 3.5 - 5.1 mmol/L   Chloride 99 98 - 111 mmol/L   CO2 29 22 - 32 mmol/L   Glucose, Bld 95 70 - 99 mg/dL    Comment: Glucose reference range applies only to samples taken after fasting for at least 8 hours.   BUN 30 (H) 8 - 23 mg/dL   Creatinine, Ser 4.61 (H) 0.61 - 1.24 mg/dL    Comment: DELTA CHECK NOTED DIALYSIS    Calcium 9.0 8.9 - 10.3 mg/dL   GFR, Estimated 13 (L) >60 mL/min    Comment: (NOTE) Calculated using the CKD-EPI Creatinine Equation (2021)    Anion gap 6 5 - 15    Comment: Performed at [facility] [address] [place], [place] [address]  Hemoglobin and hematocrit, blood     Status: Abnormal   Collection Time: 08/20/20  7:56 PM  Result Value Ref Range   Hemoglobin 8.5 (L) 13.0 - 17.0 g/dL    Comment: REPEATED TO VERIFY POST TRANSFUSION SPECIMEN    HCT 25.2 (L) 39.0 - 52.0 %    Comment: Performed at [facility] [address], [place], [place] [address]  CBC     Status: Abnormal   Collection Time: 08/21/20  4:09 AM  Result Value Ref Range   WBC 3.5 (L) 4.0 - 10.5 K/uL   RBC 2.36 (L) 4.22 - 5.81 MIL/uL   Hemoglobin 7.6 (L) 13.0 - 17.0 g/dL   HCT 21.9 (L) 39.0 - 52.0 %   MCV 92.8 80.0 - 100.0 fL    Comment: POST TRANSFUSION SPECIMEN REPEATED TO VERIFY    MCH 32.2 26.0 - 34.0 pg   MCHC 34.7 30.0 - 36.0 g/dL   RDW 19.3 (H) 11.5 - 15.5 %   Platelets 136 (L) 150 - 400 K/uL   nRBC 0.0 0.0 - 0.2 %    Comment: Performed at [facility] [address], [place], [place] [address]   Comprehensive metabolic panel     Status: Abnormal   Collection Time: 08/21/20  4:09 AM  Result Value Ref Range   Sodium 135 135 - 145 mmol/L   Potassium 3.7 3.5 - 5.1 mmol/L  Chloride 97 (L) 98 - 111 mmol/L   CO2 28 22 - 32 mmol/L   Glucose, Bld 80 70 - 99 mg/dL    Comment: Glucose reference range applies only to samples taken after fasting for at least 8 hours.   BUN 34 (H) 8 - 23 mg/dL   Creatinine, Ser 5.82 (H) 0.61 - 1.24 mg/dL   Calcium 9.2 8.9 - 10.3 mg/dL   Total Protein 5.5 (L) 6.5 - 8.1 g/dL   Albumin 3.0 (L) 3.5 - 5.0 g/dL   AST 17 15 - 41 U/L   ALT 11 0 - 44 U/L   Alkaline Phosphatase 69 38 - 126 U/L   Total Bilirubin 1.0 0.3 - 1.2 mg/dL   GFR, Estimated 10 (L) >60 mL/min    Comment: (NOTE) Calculated using the CKD-EPI Creatinine Equation (2021)    Anion gap 10 5 - 15    Comment: Performed at [facility], [place] [address], [place], [place] [address]  Prepare RBC (crossmatch)     Status: None   Collection Time: 08/21/20  8:01 AM  Result Value Ref Range   Order Confirmation      ORDER PROCESSED BY BLOOD BANK Performed at [facility], [place] [address], [place], [place] [address]   Hemoglobin and hematocrit, blood     Status: Abnormal   Collection Time: 08/21/20  6:19 PM  Result Value Ref Range   Hemoglobin 9.5 (L) 13.0 - 17.0 g/dL    Comment: REPEATED TO VERIFY POST TRANSFUSION SPECIMEN    HCT 27.9 (L) 39.0 - 52.0 %    Comment: Performed at [facility] [address], [place], [place] [address]  CBC     Status: Abnormal   Collection Time: 08/22/20  5:00 AM  Result Value Ref Range   WBC 3.8 (L) 4.0 - 10.5 K/uL   RBC 2.81 (L) 4.22 - 5.81 MIL/uL   Hemoglobin 9.0 (L) 13.0 - 17.0 g/dL   HCT 26.1 (L) 39.0 - 52.0 %   MCV 92.9 80.0 - 100.0 fL   MCH 32.0 26.0 - 34.0 pg   MCHC 34.5 30.0 - 36.0 g/dL   RDW 17.9 (H) 11.5 - 15.5 %   Platelets 123 (L) 150 - 400 K/uL   nRBC 0.0 0.0 - 0.2 %    Comment: Performed at [facility] [address], [place], [place] [address]  Basic metabolic panel     Status: Abnormal   Collection Time: 08/22/20  5:00 AM  Result Value Ref Range   Sodium 131 (L) 135 - 145 mmol/L   Potassium 4.1 3.5 - 5.1 mmol/L   Chloride 94 (L) 98 - 111 mmol/L   CO2 26 22 - 32 mmol/L   Glucose, Bld 78 70 - 99 mg/dL    Comment: Glucose reference range applies only to samples taken after fasting for at least 8 hours.   BUN 38 (H) 8 - 23 mg/dL   Creatinine, Ser 7.98 (H) 0.61 - 1.24 mg/dL   Calcium 9.3 8.9 - 10.3 mg/dL   GFR, Estimated 7 (L) >60 mL/min    Comment: (NOTE) Calculated using the CKD-EPI Creatinine Equation (2021)    Anion gap 11 5 - 15    Comment: Performed at [facility] [address], [place], [place] [address]   DG CHEST PORT 1 VIEW  Result Date: 08/22/2020 CLINICAL DATA:  Hypoxia EXAM: PORTABLE CHEST 1 VIEW COMPARISON:  August 19, 2020 FINDINGS: Endotracheal  tube tip is 3.4 cm above the carina. Central catheter tip is at the cavoatrial junction. No pneumothorax. The lungs are clear. Heart is mildly enlarged with pulmonary vascularity normal. No adenopathy. There is a right innominate/subclavian region stent, unchanged in position. IMPRESSION: Tube and catheter positions as described without pneumothorax. No edema or airspace opacity. Stable cardiac prominence. Electronically Signed   By: Lowella Grip III M.D.   On: 08/22/2020 11:03   CT Angio Abd/Pel w/ and/or w/o  Result Date: 08/21/2020 CLINICAL DATA:  Melena, gastrointestinal bleeding EXAM: CTA ABDOMEN AND PELVIS WITHOUT AND WITH CONTRAST TECHNIQUE: Multidetector CT imaging of the abdomen and pelvis was performed using the standard protocol during bolus administration of intravenous contrast. Multiplanar reconstructed images and MIPs were obtained and reviewed to evaluate the vascular anatomy. CONTRAST:  128mL OMNIPAQUE IOHEXOL 350 MG/ML SOLN COMPARISON:  03/27/2018 FINDINGS: VASCULAR Aorta: Normal caliber aorta without aneurysm,  dissection, vasculitis or significant stenosis. Diffuse atherosclerosis. Celiac: Patent without evidence of aneurysm, dissection, vasculitis or significant stenosis. SMA: The SMA is patent at its origin without focal stenosis, aneurysm, or dissection. Distal branches of the SMA supplying the bowel in the right lower quadrant demonstrates significant atheromatous plaque and multifocal high-grade stenoses. Renals: There is high-grade stenosis at the origin of the left renal artery. Moderate stenosis is seen within the origin of the right renal artery. No dissection, aneurysm, or fibromuscular dysplasia. IMA: Patent without evidence of aneurysm, dissection, vasculitis or significant stenosis. Significant atherosclerosis. Inflow: There is significant atheromatous plaque within the bilateral common iliac, external iliac, and internal iliac arteries. No high-grade stenosis. Proximal Outflow: There is extensive atherosclerosis throughout the visualized portions of the common femoral and superficial femoral arteries. No high-grade stenosis. Veins: No obvious venous abnormality within the limitations of this arterial phase study. Review of the MIP images confirms the above findings. NON-VASCULAR Lower chest: No acute pleural or parenchymal lung disease. Scattered emphysematous changes are noted. Hepatobiliary: Numerous cysts are seen throughout the liver. No other focal abnormalities. The gallbladder is unremarkable. Pancreas: Unremarkable. No pancreatic ductal dilatation or surrounding inflammatory changes. Spleen: Normal in size without focal abnormality. Adrenals/Urinary Tract: Extensive bilateral renal cortical atrophy with diffuse cystic changes consistent with history of end-stage renal disease. Adrenals are unremarkable. The bladder is decompressed. Stomach/Bowel: No bowel obstruction or ileus. No bowel wall thickening or inflammatory change. No evidence of contrast accumulation within the bowel lumen to suggest  active gastrointestinal hemorrhage. Lymphatic: No pathologic adenopathy. Reproductive: Surgical clips are seen within the prostate bed. Other: No free fluid or free gas.  No abdominal wall hernia. Musculoskeletal: Diffuse changes of renal osteodystrophy. No acute bony abnormalities. Reconstructed images demonstrate no additional findings. IMPRESSION: VASCULAR 1. No evidence of active gastrointestinal hemorrhage. 2. Extensive atherosclerosis of the aorta and its branches. Focal areas of high-grade stenosis within distal branches of the SMA in the right lower quadrant. High-grade bilateral renal artery stenosis, left greater than right. NON-VASCULAR 1. Numerous bilateral hepatic cysts unchanged. 2. Bilateral renal cortical atrophy with diffuse cystic change, consistent with known end-stage renal disease. 3. No acute intra-abdominal or intrapelvic process. 4. Aortic Atherosclerosis (ICD10-I70.0) and Emphysema (ICD10-J43.9). Electronically Signed   By: Randa Ngo M.D.   On: 08/21/2020 00:02    Pending Labs   Vitals/Pain Today's Vitals   08/22/20 1150 08/22/20 1155 08/22/20 1210 08/22/20 1220  BP:  127/68 118/67   Pulse: 80 74 73 76  Resp: 17 13 12 13   Temp:      TempSrc:  SpO2: 100% 100% 100% 100%  Weight:      Height:      PainSc: 5  5  Asleep     Isolation Precautions @ISOLATION @  Administered Medications Periop Administered Meds from 08/22/2020 0921 to 08/22/2020 1227      Date/Time Order Dose Route Action Action by Comments    08/22/2020 1128 0.9 %  sodium chloride infusion   Intravenous Anesthesia Volume Adjustment Muqtasid, Samantha T, CRNA     08/22/2020 1003 0.9 %  sodium chloride infusion   Intravenous New Bag/Given Muqtasid, Ivar Drape, CRNA     08/22/2020 0921 0.9 %  sodium chloride infusion (Manually program via Guardrails IV Fluids)   Intravenous [facility] Hold Transfer Provider, Automatic     08/22/2020 0921 acetaminophen (TYLENOL) suppository 650 mg   Rectal MAR Hold  Transfer Provider, Automatic     08/22/2020 0921 acetaminophen (TYLENOL) tablet 650 mg   Oral MAR Hold Transfer Provider, Automatic     09/05/2020 1000 atorvastatin (LIPITOR) tablet 40 mg   Oral Automatically Held Wellsite geologist, Automatic     09/04/2020 1000 atorvastatin (LIPITOR) tablet 40 mg   Oral Automatically Held Wellsite geologist, Automatic     09/03/2020 1000 atorvastatin (LIPITOR) tablet 40 mg   Oral Automatically Held Wellsite geologist, Automatic     09/02/2020 1000 atorvastatin (LIPITOR) tablet 40 mg   Oral Automatically Held Wellsite geologist, Automatic     09/01/2020 1000 atorvastatin (LIPITOR) tablet 40 mg   Oral Automatically Held Wellsite geologist, Automatic     08/31/2020 1000 atorvastatin (LIPITOR) tablet 40 mg   Oral Automatically Held Wellsite geologist, Automatic     08/30/2020 1000 atorvastatin (LIPITOR) tablet 40 mg   Oral Automatically Held Wellsite geologist, Automatic     08/29/2020 1000 atorvastatin (LIPITOR) tablet 40 mg   Oral Automatically Held Wellsite geologist, Automatic     08/28/2020 1000 atorvastatin (LIPITOR) tablet 40 mg   Oral Automatically Held Wellsite geologist, Automatic     08/27/2020 1000 atorvastatin (LIPITOR) tablet 40 mg   Oral Automatically Held Wellsite geologist, Automatic     08/26/2020 1000 atorvastatin (LIPITOR) tablet 40 mg   Oral Automatically Held Wellsite geologist, Automatic     08/25/2020 1000 atorvastatin (LIPITOR) tablet 40 mg   Oral Automatically Held Wellsite geologist, Automatic     08/24/2020 1000 atorvastatin (LIPITOR) tablet 40 mg   Oral Automatically Held Wellsite geologist, Automatic     08/23/2020 1000 atorvastatin (LIPITOR) tablet 40 mg   Oral Automatically Held Wellsite geologist, Automatic     08/22/2020 1000 atorvastatin (LIPITOR) tablet 40 mg   Oral Automatically Held Wellsite geologist, Automatic     08/22/2020 0921 atorvastatin (LIPITOR) tablet 40 mg   Oral MAR Hold Transfer Provider, Automatic     09/05/2020 2200 chlorhexidine  (PERIDEX) 0.12 % solution 15 mL   Mouth/Throat Automatically Held Transfer Provider, Automatic     09/05/2020 1800 chlorhexidine (PERIDEX) 0.12 % solution 15 mL   Mouth/Throat Automatically Held Transfer Provider, Automatic     09/05/2020 1400 chlorhexidine (PERIDEX) 0.12 % solution 15 mL   Mouth/Throat Automatically Held Transfer Provider, Automatic     09/05/2020 1000 chlorhexidine (PERIDEX) 0.12 % solution 15 mL   Mouth/Throat Automatically Held Transfer Provider, Automatic     09/04/2020 2200 chlorhexidine (PERIDEX) 0.12 % solution 15 mL   Mouth/Throat Automatically Held Transfer Provider, Automatic     09/04/2020 1800 chlorhexidine (PERIDEX) 0.12 % solution 15 mL   Mouth/Throat Automatically Charity fundraiser, Automatic     09/04/2020 1400  chlorhexidine (PERIDEX) 0.12 % solution 15 mL   Mouth/Throat Automatically Charity fundraiser, Automatic     09/04/2020 1000 chlorhexidine (PERIDEX) 0.12 % solution 15 mL   Mouth/Throat Automatically Charity fundraiser, Automatic     09/03/2020 2200 chlorhexidine (PERIDEX) 0.12 % solution 15 mL   Mouth/Throat Automatically Charity fundraiser, Automatic     09/03/2020 1800 chlorhexidine (PERIDEX) 0.12 % solution 15 mL   Mouth/Throat Automatically Charity fundraiser, Automatic     09/03/2020 1400 chlorhexidine (PERIDEX) 0.12 % solution 15 mL   Mouth/Throat Automatically Charity fundraiser, Automatic     09/03/2020 1000 chlorhexidine (PERIDEX) 0.12 % solution 15 mL   Mouth/Throat Automatically Charity fundraiser, Automatic     09/02/2020 2200 chlorhexidine (PERIDEX) 0.12 % solution 15 mL   Mouth/Throat Automatically Charity fundraiser, Automatic     09/02/2020 1800 chlorhexidine (PERIDEX) 0.12 % solution 15 mL   Mouth/Throat Automatically Charity fundraiser, Automatic     09/02/2020 1400 chlorhexidine (PERIDEX) 0.12 % solution 15 mL   Mouth/Throat Automatically Charity fundraiser, Automatic     09/02/2020 1000 chlorhexidine  (PERIDEX) 0.12 % solution 15 mL   Mouth/Throat Automatically Charity fundraiser, Automatic     09/01/2020 2200 chlorhexidine (PERIDEX) 0.12 % solution 15 mL   Mouth/Throat Automatically Charity fundraiser, Automatic     09/01/2020 1800 chlorhexidine (PERIDEX) 0.12 % solution 15 mL   Mouth/Throat Automatically Charity fundraiser, Automatic     09/01/2020 1400 chlorhexidine (PERIDEX) 0.12 % solution 15 mL   Mouth/Throat Automatically Charity fundraiser, Automatic     09/01/2020 1000 chlorhexidine (PERIDEX) 0.12 % solution 15 mL   Mouth/Throat Automatically Charity fundraiser, Automatic     08/31/2020 2200 chlorhexidine (PERIDEX) 0.12 % solution 15 mL   Mouth/Throat Automatically Charity fundraiser, Automatic     08/31/2020 1800 chlorhexidine (PERIDEX) 0.12 % solution 15 mL   Mouth/Throat Automatically Charity fundraiser, Automatic     08/31/2020 1400 chlorhexidine (PERIDEX) 0.12 % solution 15 mL   Mouth/Throat Automatically Charity fundraiser, Automatic     08/31/2020 1000 chlorhexidine (PERIDEX) 0.12 % solution 15 mL   Mouth/Throat Automatically Charity fundraiser, Automatic     08/30/2020 2200 chlorhexidine (PERIDEX) 0.12 % solution 15 mL   Mouth/Throat Automatically Charity fundraiser, Automatic     08/30/2020 1800 chlorhexidine (PERIDEX) 0.12 % solution 15 mL   Mouth/Throat Automatically Charity fundraiser, Automatic     08/30/2020 1400 chlorhexidine (PERIDEX) 0.12 % solution 15 mL   Mouth/Throat Automatically Charity fundraiser, Automatic     08/30/2020 1000 chlorhexidine (PERIDEX) 0.12 % solution 15 mL   Mouth/Throat Automatically Charity fundraiser, Automatic     08/29/2020 2200 chlorhexidine (PERIDEX) 0.12 % solution 15 mL   Mouth/Throat Automatically Charity fundraiser, Automatic     08/29/2020 1800 chlorhexidine (PERIDEX) 0.12 % solution 15 mL   Mouth/Throat Automatically Charity fundraiser, Automatic     08/29/2020 1400 chlorhexidine  (PERIDEX) 0.12 % solution 15 mL   Mouth/Throat Automatically Charity fundraiser, Automatic     08/29/2020 1000 chlorhexidine (PERIDEX) 0.12 % solution 15 mL   Mouth/Throat Automatically Charity fundraiser, Automatic     08/28/2020 2200 chlorhexidine (PERIDEX) 0.12 % solution 15 mL   Mouth/Throat Automatically Held Wellsite geologist, Automatic     08/28/2020 1800 chlorhexidine (PERIDEX) 0.12 % solution 15 mL   Mouth/Throat Automatically Held Transfer Provider, Automatic     08/28/2020 1400 chlorhexidine (PERIDEX) 0.12 % solution 15 mL   Mouth/Throat  Automatically Held Wellsite geologist, Automatic     08/28/2020 1000 chlorhexidine (PERIDEX) 0.12 % solution 15 mL   Mouth/Throat Automatically Charity fundraiser, Automatic     08/27/2020 2200 chlorhexidine (PERIDEX) 0.12 % solution 15 mL   Mouth/Throat Automatically Charity fundraiser, Automatic     08/27/2020 1800 chlorhexidine (PERIDEX) 0.12 % solution 15 mL   Mouth/Throat Automatically Charity fundraiser, Automatic     08/27/2020 1400 chlorhexidine (PERIDEX) 0.12 % solution 15 mL   Mouth/Throat Automatically Charity fundraiser, Automatic     08/27/2020 1000 chlorhexidine (PERIDEX) 0.12 % solution 15 mL   Mouth/Throat Automatically Charity fundraiser, Automatic     08/26/2020 2200 chlorhexidine (PERIDEX) 0.12 % solution 15 mL   Mouth/Throat Automatically Charity fundraiser, Automatic     08/26/2020 1800 chlorhexidine (PERIDEX) 0.12 % solution 15 mL   Mouth/Throat Automatically Charity fundraiser, Automatic     08/26/2020 1400 chlorhexidine (PERIDEX) 0.12 % solution 15 mL   Mouth/Throat Automatically Charity fundraiser, Automatic     08/26/2020 1000 chlorhexidine (PERIDEX) 0.12 % solution 15 mL   Mouth/Throat Automatically Charity fundraiser, Automatic     08/25/2020 2200 chlorhexidine (PERIDEX) 0.12 % solution 15 mL   Mouth/Throat Automatically Charity fundraiser, Automatic     08/25/2020 1800 chlorhexidine  (PERIDEX) 0.12 % solution 15 mL   Mouth/Throat Automatically Charity fundraiser, Automatic     08/25/2020 1400 chlorhexidine (PERIDEX) 0.12 % solution 15 mL   Mouth/Throat Automatically Charity fundraiser, Automatic     08/25/2020 1000 chlorhexidine (PERIDEX) 0.12 % solution 15 mL   Mouth/Throat Automatically Charity fundraiser, Automatic     08/24/2020 2200 chlorhexidine (PERIDEX) 0.12 % solution 15 mL   Mouth/Throat Automatically Charity fundraiser, Automatic     08/24/2020 1800 chlorhexidine (PERIDEX) 0.12 % solution 15 mL   Mouth/Throat Automatically Charity fundraiser, Automatic     08/24/2020 1400 chlorhexidine (PERIDEX) 0.12 % solution 15 mL   Mouth/Throat Automatically Charity fundraiser, Automatic     08/24/2020 1000 chlorhexidine (PERIDEX) 0.12 % solution 15 mL   Mouth/Throat Automatically Charity fundraiser, Automatic     08/23/2020 2200 chlorhexidine (PERIDEX) 0.12 % solution 15 mL   Mouth/Throat Automatically Charity fundraiser, Automatic     08/23/2020 1800 chlorhexidine (PERIDEX) 0.12 % solution 15 mL   Mouth/Throat Automatically Charity fundraiser, Automatic     08/23/2020 1400 chlorhexidine (PERIDEX) 0.12 % solution 15 mL   Mouth/Throat Automatically Charity fundraiser, Automatic     08/23/2020 1000 chlorhexidine (PERIDEX) 0.12 % solution 15 mL   Mouth/Throat Automatically Charity fundraiser, Automatic     08/22/2020 2200 chlorhexidine (PERIDEX) 0.12 % solution 15 mL   Mouth/Throat Automatically Charity fundraiser, Automatic     08/22/2020 1800 chlorhexidine (PERIDEX) 0.12 % solution 15 mL   Mouth/Throat Automatically Charity fundraiser, Automatic     08/22/2020 1400 chlorhexidine (PERIDEX) 0.12 % solution 15 mL   Mouth/Throat Automatically Charity fundraiser, Automatic     08/22/2020 1000 chlorhexidine (PERIDEX) 0.12 % solution 15 mL   Mouth/Throat Automatically Charity fundraiser, Automatic     08/22/2020 0921 chlorhexidine  (PERIDEX) 0.12 % solution 15 mL   Mouth/Throat MAR Hold Transfer Provider, Automatic     09/05/2020 0600 Chlorhexidine Gluconate Cloth 2 % PADS 6 each   Topical Automatically Held Transfer Provider, Automatic     09/04/2020 0600 Chlorhexidine Gluconate Cloth 2 % PADS 6 each   Topical Automatically Charity fundraiser, Automatic  09/03/2020 0600 Chlorhexidine Gluconate Cloth 2 % PADS 6 each   Topical Automatically Charity fundraiser, Automatic     09/02/2020 0600 Chlorhexidine Gluconate Cloth 2 % PADS 6 each   Topical Automatically Charity fundraiser, Automatic     09/01/2020 0600 Chlorhexidine Gluconate Cloth 2 % PADS 6 each   Topical Automatically Charity fundraiser, Automatic     08/31/2020 0600 Chlorhexidine Gluconate Cloth 2 % PADS 6 each   Topical Automatically Charity fundraiser, Automatic     08/30/2020 0600 Chlorhexidine Gluconate Cloth 2 % PADS 6 each   Topical Automatically Charity fundraiser, Automatic     08/29/2020 0600 Chlorhexidine Gluconate Cloth 2 % PADS 6 each   Topical Automatically Charity fundraiser, Automatic     08/28/2020 0600 Chlorhexidine Gluconate Cloth 2 % PADS 6 each   Topical Automatically Charity fundraiser, Automatic     08/27/2020 0600 Chlorhexidine Gluconate Cloth 2 % PADS 6 each   Topical Automatically Charity fundraiser, Automatic     08/26/2020 0600 Chlorhexidine Gluconate Cloth 2 % PADS 6 each   Topical Automatically Charity fundraiser, Automatic     08/25/2020 0600 Chlorhexidine Gluconate Cloth 2 % PADS 6 each   Topical Automatically Charity fundraiser, Automatic     08/24/2020 0600 Chlorhexidine Gluconate Cloth 2 % PADS 6 each   Topical Automatically Charity fundraiser, Automatic     08/23/2020 0600 Chlorhexidine Gluconate Cloth 2 % PADS 6 each   Topical Automatically Held Transfer Provider, Automatic     08/22/2020 1610 Chlorhexidine Gluconate Cloth 2 % PADS 6 each   Topical MAR Hold Transfer Provider, Automatic      08/22/2020 1153 fentaNYL (SUBLIMAZE) injection 25-50 mcg 25 mcg Intravenous Given Langley Gauss, RN     09/05/2020 2200 ferric citrate (AURYXIA) tablet 420 mg   Oral Automatically Held Wellsite geologist, Automatic     09/05/2020 1600 ferric citrate (AURYXIA) tablet 420 mg   Oral Automatically Held Wellsite geologist, Automatic     09/05/2020 1000 ferric citrate (AURYXIA) tablet 420 mg   Oral Automatically Held Wellsite geologist, Automatic     09/04/2020 2200 ferric citrate (AURYXIA) tablet 420 mg   Oral Automatically Held Wellsite geologist, Automatic     09/04/2020 1600 ferric citrate (AURYXIA) tablet 420 mg   Oral Automatically Held Wellsite geologist, Automatic     09/04/2020 1000 ferric citrate (AURYXIA) tablet 420 mg   Oral Automatically Held Wellsite geologist, Automatic     09/03/2020 2200 ferric citrate (AURYXIA) tablet 420 mg   Oral Automatically Held Wellsite geologist, Automatic     09/03/2020 1600 ferric citrate (AURYXIA) tablet 420 mg   Oral Automatically Held Wellsite geologist, Automatic     09/03/2020 1000 ferric citrate (AURYXIA) tablet 420 mg   Oral Automatically Held Wellsite geologist, Automatic     09/02/2020 2200 ferric citrate (AURYXIA) tablet 420 mg   Oral Automatically Held Transfer Provider, Automatic     09/02/2020 1600 ferric citrate (AURYXIA) tablet 420 mg   Oral Automatically Held Wellsite geologist, Automatic     09/02/2020 1000 ferric citrate (AURYXIA) tablet 420 mg   Oral Automatically Held Transfer Provider, Automatic     09/01/2020 2200 ferric citrate (AURYXIA) tablet 420 mg   Oral Automatically Held Transfer Provider, Automatic     09/01/2020 1600 ferric citrate (AURYXIA) tablet 420 mg   Oral Automatically Held Transfer Provider, Automatic     09/01/2020 1000 ferric citrate (AURYXIA) tablet 420 mg   Oral Automatically Held Transfer  Provider, Automatic     08/31/2020 2200 ferric citrate (AURYXIA) tablet 420 mg   Oral Automatically Held Wellsite geologist, Automatic      08/31/2020 1600 ferric citrate (AURYXIA) tablet 420 mg   Oral Automatically Held Wellsite geologist, Automatic     08/31/2020 1000 ferric citrate (AURYXIA) tablet 420 mg   Oral Automatically Held Wellsite geologist, Automatic     08/30/2020 2200 ferric citrate (AURYXIA) tablet 420 mg   Oral Automatically Held Wellsite geologist, Automatic     08/30/2020 1600 ferric citrate (AURYXIA) tablet 420 mg   Oral Automatically Held Wellsite geologist, Automatic     08/30/2020 1000 ferric citrate (AURYXIA) tablet 420 mg   Oral Automatically Held Wellsite geologist, Automatic     08/29/2020 2200 ferric citrate (AURYXIA) tablet 420 mg   Oral Automatically Held Wellsite geologist, Automatic     08/29/2020 1600 ferric citrate (AURYXIA) tablet 420 mg   Oral Automatically Held Wellsite geologist, Automatic     08/29/2020 1000 ferric citrate (AURYXIA) tablet 420 mg   Oral Automatically Held Wellsite geologist, Automatic     08/28/2020 2200 ferric citrate (AURYXIA) tablet 420 mg   Oral Automatically Held Wellsite geologist, Automatic     08/28/2020 1600 ferric citrate (AURYXIA) tablet 420 mg   Oral Automatically Held Wellsite geologist, Automatic     08/28/2020 1000 ferric citrate (AURYXIA) tablet 420 mg   Oral Automatically Held Wellsite geologist, Automatic     08/27/2020 2200 ferric citrate (AURYXIA) tablet 420 mg   Oral Automatically Held Transfer Provider, Automatic     08/27/2020 1600 ferric citrate (AURYXIA) tablet 420 mg   Oral Automatically Held Wellsite geologist, Automatic     08/27/2020 1000 ferric citrate (AURYXIA) tablet 420 mg   Oral Automatically Held Wellsite geologist, Automatic     08/26/2020 2200 ferric citrate (AURYXIA) tablet 420 mg   Oral Automatically Held Transfer Provider, Automatic     08/26/2020 1600 ferric citrate (AURYXIA) tablet 420 mg   Oral Automatically Held Wellsite geologist, Automatic     08/26/2020 1000 ferric citrate (AURYXIA) tablet 420 mg   Oral Automatically Held Wellsite geologist,  Automatic     08/25/2020 2200 ferric citrate (AURYXIA) tablet 420 mg   Oral Automatically Held Transfer Provider, Automatic     08/25/2020 1600 ferric citrate (AURYXIA) tablet 420 mg   Oral Automatically Held Wellsite geologist, Automatic     08/25/2020 1000 ferric citrate (AURYXIA) tablet 420 mg   Oral Automatically Held Transfer Provider, Automatic     08/24/2020 2200 ferric citrate (AURYXIA) tablet 420 mg   Oral Automatically Held Transfer Provider, Automatic     08/24/2020 1600 ferric citrate (AURYXIA) tablet 420 mg   Oral Automatically Held Wellsite geologist, Automatic     08/24/2020 1000 ferric citrate (AURYXIA) tablet 420 mg   Oral Automatically Held Wellsite geologist, Automatic     08/23/2020 2200 ferric citrate (AURYXIA) tablet 420 mg   Oral Automatically Held Transfer Provider, Automatic     08/23/2020 1600 ferric citrate (AURYXIA) tablet 420 mg   Oral Automatically Held Transfer Provider, Automatic     08/23/2020 1000 ferric citrate (AURYXIA) tablet 420 mg   Oral Automatically Held Transfer Provider, Automatic     08/22/2020 2200 ferric citrate (AURYXIA) tablet 420 mg   Oral Automatically Held Transfer Provider, Automatic     08/22/2020 1600 ferric citrate (AURYXIA) tablet 420 mg   Oral Automatically Held Transfer Provider, Automatic     08/22/2020 1000 ferric citrate (AURYXIA) tablet 420 mg   Oral Automatically  Held Wellsite geologist, Automatic     08/22/2020 [phone] ferric citrate (AURYXIA) tablet 420 mg   Oral MAR Hold Transfer Provider, Automatic     09/05/2020 0600 hydrOXYzine (ATARAX/VISTARIL) tablet 50 mg   Oral Automatically Held Wellsite geologist, Automatic     09/04/2020 0600 hydrOXYzine (ATARAX/VISTARIL) tablet 50 mg   Oral Automatically Held Wellsite geologist, Automatic     09/03/2020 0600 hydrOXYzine (ATARAX/VISTARIL) tablet 50 mg   Oral Automatically Held Wellsite geologist, Automatic     09/02/2020 0600 hydrOXYzine (ATARAX/VISTARIL) tablet 50 mg   Oral Automatically Held  Wellsite geologist, Automatic     09/01/2020 0600 hydrOXYzine (ATARAX/VISTARIL) tablet 50 mg   Oral Automatically Held Wellsite geologist, Automatic     08/31/2020 0600 hydrOXYzine (ATARAX/VISTARIL) tablet 50 mg   Oral Automatically Held Wellsite geologist, Automatic     08/30/2020 0600 hydrOXYzine (ATARAX/VISTARIL) tablet 50 mg   Oral Automatically Held Wellsite geologist, Automatic     08/29/2020 0600 hydrOXYzine (ATARAX/VISTARIL) tablet 50 mg   Oral Automatically Held Wellsite geologist, Automatic     08/28/2020 0600 hydrOXYzine (ATARAX/VISTARIL) tablet 50 mg   Oral Automatically Held Wellsite geologist, Automatic     08/27/2020 0600 hydrOXYzine (ATARAX/VISTARIL) tablet 50 mg   Oral Automatically Held Wellsite geologist, Automatic     08/26/2020 0600 hydrOXYzine (ATARAX/VISTARIL) tablet 50 mg   Oral Automatically Held Wellsite geologist, Automatic     08/25/2020 0600 hydrOXYzine (ATARAX/VISTARIL) tablet 50 mg   Oral Automatically Held Wellsite geologist, Automatic     08/24/2020 0600 hydrOXYzine (ATARAX/VISTARIL) tablet 50 mg   Oral Automatically Held Wellsite geologist, Automatic     08/23/2020 0600 hydrOXYzine (ATARAX/VISTARIL) tablet 50 mg   Oral Automatically Held Wellsite geologist, Automatic     08/22/2020 0921 hydrOXYzine (ATARAX/VISTARIL) tablet 50 mg   Oral MAR Hold Transfer Provider, Automatic     08/22/2020 0921 levalbuterol (XOPENEX) nebulizer solution 0.63 mg   Nebulization MAR Hold Transfer Provider, Automatic     08/22/2020 0921 methocarbamol (ROBAXIN) tablet 750 mg   Oral MAR Hold Transfer Provider, Automatic     08/22/2020 0921 ondansetron (ZOFRAN) injection 4 mg   Intravenous MAR Hold Transfer Provider, Automatic     08/22/2020 0921 ondansetron (ZOFRAN) tablet 4 mg   Oral MAR Hold Transfer Provider, Automatic     08/22/2020 0921 oxyCODONE (Oxy IR/ROXICODONE) immediate release tablet 5 mg   Oral MAR Hold Transfer Provider, Automatic     08/22/2020 1043 pantoprazole (PROTONIX) 80 mg in  sodium chloride 0.9 % 100 mL (0.8 mg/mL) infusion 8 mg/hr Intravenous Infusion Verify Theron Arista, RN     08/22/2020 1003 pantoprazole (PROTONIX) 80 mg in sodium chloride 0.9 % 100 mL (0.8 mg/mL) infusion 8 mg/hr Intravenous Continued from Pre-op Muqtasid, Samantha T, CRNA     08/31/2020 2200 pantoprazole (PROTONIX) injection 40 mg   Intravenous Automatically Held Wellsite geologist, Automatic     08/31/2020 1000 pantoprazole (PROTONIX) injection 40 mg   Intravenous Automatically Held Wellsite geologist, Automatic     08/30/2020 2200 pantoprazole (PROTONIX) injection 40 mg   Intravenous Automatically Held Wellsite geologist, Automatic     08/30/2020 1000 pantoprazole (PROTONIX) injection 40 mg   Intravenous Automatically Held Wellsite geologist, Automatic     08/29/2020 2200 pantoprazole (PROTONIX) injection 40 mg   Intravenous Automatically Held Wellsite geologist, Automatic     08/29/2020 1000 pantoprazole (PROTONIX) injection 40 mg   Intravenous Automatically Held Wellsite geologist, Automatic     08/28/2020 2200 pantoprazole (PROTONIX) injection 40 mg   Intravenous Automatically Charity fundraiser,  Automatic     08/28/2020 1000 pantoprazole (PROTONIX) injection 40 mg   Intravenous Automatically Held Wellsite geologist, Automatic     08/27/2020 2200 pantoprazole (PROTONIX) injection 40 mg   Intravenous Automatically Held Wellsite geologist, Automatic     08/27/2020 1000 pantoprazole (PROTONIX) injection 40 mg   Intravenous Automatically Held Wellsite geologist, Automatic     08/26/2020 2200 pantoprazole (PROTONIX) injection 40 mg   Intravenous Automatically Held Wellsite geologist, Automatic     08/26/2020 1000 pantoprazole (PROTONIX) injection 40 mg   Intravenous Automatically Held Wellsite geologist, Automatic     08/25/2020 2200 pantoprazole (PROTONIX) injection 40 mg   Intravenous Automatically Held Wellsite geologist, Automatic     08/25/2020 1000 pantoprazole (PROTONIX) injection 40 mg   Intravenous  Automatically Held Wellsite geologist, Automatic     08/24/2020 2200 pantoprazole (PROTONIX) injection 40 mg   Intravenous Automatically Held Wellsite geologist, Automatic     08/24/2020 1000 pantoprazole (PROTONIX) injection 40 mg   Intravenous Automatically Held Wellsite geologist, Automatic     08/23/2020 2045 pantoprazole (PROTONIX) injection 40 mg   Intravenous Automatically Held Wellsite geologist, Automatic     08/22/2020 0921 pantoprazole (PROTONIX) injection 40 mg   Intravenous MAR Hold Transfer Provider, Automatic     08/22/2020 1034 propofol (DIPRIVAN) 10 mg/mL bolus/IV push 50 mg Intravenous Given Muqtasid, Samantha T, CRNA     08/22/2020 1011 propofol (DIPRIVAN) 10 mg/mL bolus/IV push 20 mg Intravenous Given Muqtasid, Samantha T, CRNA     08/22/2020 1106 propofol (DIPRIVAN) 500 MG/50ML infusion 0 mcg/kg/min Intravenous Stopped Muqtasid, Samantha T, CRNA     08/22/2020 1053 propofol (DIPRIVAN) 500 MG/50ML infusion 75 mcg/kg/min Intravenous Restarted Muqtasid, Ivar Drape, CRNA     08/22/2020 1036 propofol (DIPRIVAN) 500 MG/50ML infusion 0 mcg/kg/min Intravenous Stopped Muqtasid, Samantha T, CRNA     08/22/2020 1007 propofol (DIPRIVAN) 500 MG/50ML infusion 75 mcg/kg/min Intravenous New Bag/Given Muqtasid, Ivar Drape, CRNA     08/22/2020 1034 succinylcholine (ANECTINE) syringe 80 mg Intravenous Given Muqtasid, Ivar Drape, CRNA     09/05/2020 2200 traZODone (DESYREL) tablet 150 mg   Oral Automatically Charity fundraiser, Automatic     09/04/2020 2200 traZODone (DESYREL) tablet 150 mg   Oral Automatically Held Wellsite geologist, Automatic     09/03/2020 2200 traZODone (DESYREL) tablet 150 mg   Oral Automatically Held Wellsite geologist, Automatic     09/02/2020 2200 traZODone (DESYREL) tablet 150 mg   Oral Automatically Held Wellsite geologist, Automatic     09/01/2020 2200 traZODone (DESYREL) tablet 150 mg   Oral Automatically Held Wellsite geologist, Automatic     08/31/2020 2200 traZODone  (DESYREL) tablet 150 mg   Oral Automatically Held Wellsite geologist, Automatic     08/30/2020 2200 traZODone (DESYREL) tablet 150 mg   Oral Automatically Held Wellsite geologist, Automatic     08/29/2020 2200 traZODone (DESYREL) tablet 150 mg   Oral Automatically Held Wellsite geologist, Automatic     08/28/2020 2200 traZODone (DESYREL) tablet 150 mg   Oral Automatically Held Wellsite geologist, Automatic     08/27/2020 2200 traZODone (DESYREL) tablet 150 mg   Oral Automatically Held Wellsite geologist, Automatic     08/26/2020 2200 traZODone (DESYREL) tablet 150 mg   Oral Automatically Held Wellsite geologist, Automatic     08/25/2020 2200 traZODone (DESYREL) tablet 150 mg   Oral Automatically Held Wellsite geologist, Automatic     08/24/2020 2200 traZODone (DESYREL) tablet 150 mg   Oral Automatically Held Wellsite geologist, Automatic     08/23/2020 2200 traZODone (  DESYREL) tablet 150 mg   Oral Automatically Held Wellsite geologist, Automatic     08/22/2020 2200 traZODone (DESYREL) tablet 150 mg   Oral Automatically Held Wellsite geologist, Automatic     08/22/2020 0921 traZODone (DESYREL) tablet 150 mg   Oral MAR Hold Transfer Provider, Automatic       Mobility {Mobility:[id number]}

## 2020-08-22 NOTE — Anesthesia Postprocedure Evaluation (Signed)
Anesthesia Post Note  Patient: [name]  Procedure(s) Performed: COLONOSCOPY WITH PROPOFOL (N/A ) POLYPECTOMY     Patient location during evaluation: PACU Anesthesia Type: General Level of consciousness: awake and alert Pain management: pain level controlled Vital Signs Assessment: post-procedure vital signs reviewed and stable Respiratory status: spontaneous breathing, nonlabored ventilation, respiratory function stable and patient connected to nasal cannula oxygen Cardiovascular status: blood pressure returned to baseline and stable Postop Assessment: no apparent nausea or vomiting Anesthetic complications: no   No complications documented.  Last Vitals:  Vitals:   08/22/20 1220 08/22/20 1230  BP:    Pulse: 76   Resp: 13   Temp:  (!) 36.3 C  SpO2: 100%     Last Pain:  Vitals:   08/22/20 1210  TempSrc:   PainSc: Asleep                 Ryenn Howeth,W. EDMOND

## 2020-08-22 NOTE — Op Note (Signed)
[facility] Patient Name: Logan Martin Procedure Date : 08/22/2020 MRN: [id number] Attending MD: Jackquline Denmark , MD Date of Birth: Oct 07, 1955 CSN: [id number] Age: 65 Admit Type: Inpatient Procedure:                Colonoscopy Indications:              Hematochezia Providers:                Jackquline Denmark, MD, Particia Nearing, RN, Ladona Ridgel,                            Technician Referring MD:              Medicines:                Monitored Anesthesia Care Complications:            Respiratory failure - required intubation by                            anesthesia d/t flash pulm edema. The first attempt                            resulted into esophageal intubation. Estimated Blood Loss:     Estimated blood loss: none. Procedure:                Pre-Anesthesia Assessment:                           - Prior to the procedure, a History and Physical                            was performed, and patient medications and                            allergies were reviewed. The patient's tolerance of                            previous anesthesia was also reviewed. The risks                            and benefits of the procedure and the sedation                            options and risks were discussed with the patient.                            All questions were answered, and informed consent                            was obtained. Prior Anticoagulants: The patient has                            taken no previous anticoagulant or antiplatelet                            agents. ASA Grade Assessment:  III - A patient with                            severe systemic disease. After reviewing the risks                            and benefits, the patient was deemed in                            satisfactory condition to undergo the procedure.                           After obtaining informed consent, the colonoscope                            was passed under direct vision.  Throughout the                            procedure, the patient's blood pressure, pulse, and                            oxygen saturations were monitored continuously. The                            [id number]-H190DL ([id number] Olympus pediatric colonscope                            was introduced through the anus and advanced to the                            the cecum, identified by appendiceal orifice and                            ileocecal valve. The colonoscopy was technically                            difficult and complex due to a redundant colon. The                            patient tolerated the procedure poorly due to the                            patient's comorbid medical conditions. The quality                            of the bowel preparation was adequate to identify                            polyps. The ileocecal valve, appendiceal orifice,                            and rectum were photographed. Scope In: 10:15:58 AM Scope Out: 10:32:57 AM Scope Withdrawal Time: 0 hours 11 minutes 4 seconds  Total Procedure Duration: 0  hours 16 minutes 59 seconds  Findings:      A 6 mm polyp was found in the proximal transverse colon. The polyp was       sessile. The polyp was removed with a cold snare. Resection and       retrieval were complete.      A 6 mm polyp was found in the mid descending colon. The polyp was       sessile. The polyp was removed with a cold snare. Resection and       retrieval were complete.      Multiple medium-mouthed diverticula were found in the sigmoid colon and       ascending colon.      Non-bleeding internal hemorrhoids were found during retroflexion. The       hemorrhoids were small. Healed posterior anal fissure.      Patient had respiratory failure intraprocedure requiring intubation.       Chest x-ray was clear. Procedure was completed using a EGD scope (as       only sigmoid colon was left to be evaluated). The colonoscopy was        completed. Impression:               - One 6 mm polyp in the proximal transverse colon,                            removed with a cold snare. Resected and retrieved.                           - One 6 mm polyp in the mid descending colon,                            removed with a cold snare. Resected and retrieved.                           - Diverticulosis in the sigmoid colon and in the                            ascending colon.                           - Non-bleeding internal hemorrhoids. Recommendation:           - Return patient to PACU for ongoing care.                           - Await pathology results.                           - Immediate postoperative care per anesthesia                           - Trend CBC. Transfuse as needed. Watch for delayed                            complications including aspiration.                           - The  findings and recommendations were discussed                            with the patient's family. Procedure Code(s):        --- Professional ---                           [phone], Colonoscopy, flexible; with removal of                            tumor(s), polyp(s), or other lesion(s) by snare                            technique Diagnosis Code(s):        --- Professional ---                           K63.5, Polyp of colon                           K64.8, Other hemorrhoids                           K92.1, Melena (includes Hematochezia)                           K57.30, Diverticulosis of large intestine without                            perforation or abscess without bleeding CPT copyright 2019 [employer]. All rights reserved. The codes documented in this report are preliminary and upon coder review may  be revised to meet current compliance requirements. Jackquline Denmark, MD 08/22/2020 11:11:32 AM This report has been signed electronically. Number of Addenda: 0

## 2020-08-22 NOTE — Progress Notes (Signed)
  [facility] Progress Note   Subjective:  Sleeping today without complaint. Colonoscopy today.   Objective Vitals:   08/21/20 1419 08/21/20 1541 08/21/20 2019 08/22/20 0328  BP: (!) 143/83 (!) 146/67 (!) 143/75 (!) 157/76  Pulse: 100 86    Resp: 12 18 20    Temp: 97.6 F (36.4 C) (!) 97.1 F (36.2 C) 98.5 F (36.9 C) 97.6 F (36.4 C)  TempSrc: Oral Oral Oral Axillary  SpO2: 99% 99% 99% 100%  Weight:    50.3 kg  Height:         Additional Objective Labs: Basic Metabolic Panel: Recent Labs  Lab 08/20/20 1956 08/21/20 0409 08/22/20 0500  NA 134* 135 131*  K 3.3* 3.7 4.1  CL 99 97* 94*  CO2 29 28 26   GLUCOSE 95 80 78  BUN 30* 34* 38*  CREATININE 4.61* 5.82* 7.98*  CALCIUM 9.0 9.2 9.3   CBC: Recent Labs  Lab 08/19/20 2125 08/20/20 0404 08/20/20 1956 08/21/20 0409 08/21/20 1819 08/22/20 0500  WBC 5.3 4.8  --  3.5*  --  3.8*  HGB 7.9* 6.4*   < > 7.6* 9.5* 9.0*  HCT 25.2* 19.9*   < > 21.9* 27.9* 26.1*  MCV 103.7* 101.5*  --  92.8  --  92.9  PLT 124* 167  --  136*  --  123*   < > = values in this interval not displayed.   Blood Culture    Component Value Date/Time   SDES BLOOD SITE NOT SPECIFIED 08/20/2020 0404   SPECREQUEST AEROBIC BOTTLE ONLY Blood Culture adequate volume 08/20/2020 0404   CULT  08/20/2020 0404    NO GROWTH 2 DAYS Performed at [facility], [place] [address] [place], [place] [address]    REPTSTATUS PENDING 08/20/2020 0404     Physical Exam General: Non-toxic appearing, nad  Heart: normal rate Lungs: bilateral chest rise, no iwob Abdomen: soft non-distended  Extremities: No LE edema  Dialysis Access: RUE AVF +bruit   Medications: . pantoprozole (PROTONIX) infusion 8 mg/hr (08/22/20 0113)   . sodium chloride   Intravenous Once  . atorvastatin  40 mg Oral Daily  . chlorhexidine  15 mL Mouth/Throat QID  . Chlorhexidine Gluconate Cloth  6 each Topical Q0600  . ferric citrate  420 mg Oral TID  . hydrOXYzine   50 mg Oral Q0600  . [START ON 08/23/2020] pantoprazole  40 mg Intravenous Q12H  . sodium zirconium cyclosilicate  10 g Oral Once  . traZODone  150 mg Oral QHS    Dialysis Orders:  Nx Stage MTTF EDW 46.5kg   Assessment/Plan: 1. Tachycardia. Hx of PE. Low clinical suspicion for PE at this point. Felt 2/2 to anemia. Overall stable. w/u per primary 2. Melena/GIB -  s/p 2 u prbcs 3/26. On Protonix For colonoscopy today  3. ESRD -  Home HD. Will continue MWF while here. Next HD today if permitted around procedure. No heparin.  K+ 3.7  4. Anemia --  Hgb 9 today; w/u GI bleed.  Received Aranesp 60 with HD 3/26.  5. Hypertension/volume  - BP controlled. . UF to dry weight as tolerated  6. Metabolic bone disease -  Continue Auryxia, calcitriol, Sensipar when taking po.  7. Oral abscess - now off Augmentin; oral hygiene    [place] 08/22/2020,8:43 AM

## 2020-08-22 NOTE — Progress Notes (Signed)
Patient reminded to continue to drink bowel prep. Less than half consumed at midnight. Explained to patient the need for clear stool before procedure.

## 2020-08-22 NOTE — Progress Notes (Signed)
PROGRESS NOTE                                                                             PROGRESS NOTE                                                                                                                                                                                                             Patient Demographics:    Logan Martin, is a 65 y.o. male, DOB - 1955/12/25, ERD:[id number]  Outpatient Primary MD for the patient is [name], NP    LOS - 2  Admit date - 08/19/2020    Chief Complaint  Patient presents with  . Chest Pain       Brief Narrative     Logan Martin is a 65 y.o. male, with PMH of ESRD, HTN, HLD, CAD, history of adenocarcinoma of the bladder, anemia, thrombocytopenia, hx of PE, hepatitis C who presented to the ER on 08/19/2020 with general malaise, tachycardia.  His work-up was significant for anemia, as well he was noted to have melena, CTA abdomen pelvis with no source of GI bleed, he is required total of 3 units PRBC transfusion so far, seen by GI, went for colonoscopy 3/28, which was significant for polyp and diverticulosis.       Subjective:    Logan Martin today this morning with no active complaints, had a bowel movement with bowel prep, evaluated again when he came back from colonoscopy, complaining of chest pain.      Assessment  & Plan :    Active Problems:   Atherosclerosis of native coronary artery of native heart without angina pectoris   ESRD on dialysis (HCC)   Anemia associated with chronic renal failure   Pancytopenia (HCC)   Peripheral vascular disease (HCC)   Tachycardia   Oral infection  Symptomatic anemia/acute blood loss anemia -With underlining anemia of chronic kidney disease -Secondary to GI bleed, given he is having significant melena. -Required 3 units PRBC transfusion so far, hemoglobin with response, it  is 9 this morning . -H&H closely and transfuse as  needed -Procrit and IV iron per renal  Acute blood loss anemia/GI bleed -Required 3 units PRBC transfusion. -CTA abdomen pelvis with no evidence of GI bleed, went for colonoscopy this morning, significant for polyp and diverticulosis and ascending colon. -Continue with Protonix, hemoglobin appears to be stable over last 12 hours.  Chest pain -Patient required intubation for colonoscopy given flash pulmonary edema during procedure, he currently presents with chest pain, EKG nonacute, will cycle troponins trauma, obtain DG esophagus with water-soluble contrast as well.  ESRD on hemodialysis -Renal consulted  Hyperkalemia -Secondary to ESRD -He is on rescheduled Lokelma, I will discontinue given potassium 3.7 today.  Oral infection -This appears to be resolved this morning with good oral hygiene, it was small underlying infection, discontinue Augmentin, continue with chlorhexidine mouthwash and good oral hygiene.    Essential hypertension -Hold medication given GI bleed.     SpO2: 100 % O2 Flow Rate (L/min): 2 L/min  Recent Labs  Lab 08/19/20 1855 08/19/20 1916 08/19/20 2125 08/20/20 0021 08/20/20 0404 08/21/20 0409 08/22/20 0500  WBC  --   --  5.3  --  4.8 3.5* 3.8*  PLT  --   --  124*  --  167 136* 123*  AST  --   --   --   --  18 17  --   ALT  --   --   --   --  12 11  --   ALKPHOS  --   --   --   --  74 69  --   BILITOT  --   --   --   --  1.1 1.0  --   ALBUMIN  --   --   --   --  3.4* 3.0*  --   INR 1.1  --   --   --   --   --   --   LATICACIDVEN  --   --   --  0.7 1.6  --   --   SARSCOV2NAA  --  NEGATIVE  --   --   --   --   --        ABG     Component Value Date/Time   PHART 7.486 (H) 05/05/2020 0053   PCO2ART 37.2 05/05/2020 0053   PO2ART 82 (L) 05/05/2020 0053   HCO3 28.3 (H) 05/05/2020 0053   TCO2 30 05/05/2020 0053   ACIDBASEDEF 1.0 03/15/2020 1331   O2SAT 97.0 05/05/2020 0053      Condition - Extremely Guarded  Family Communication  :   full  Code Status :  Full  Consults  :  GI,renal  Procedures  :   Right IJ line placement by IR 3/27 Colonoscopy 3/28  Disposition Plan  :    Status is: Inpatient  Remains inpatient appropriate because:IV treatments appropriate due to intensity of illness or inability to take PO   Dispo: The patient is from: Home              Anticipated d/c is to: Home              Patient currently is not medically stable to d/c.   Difficult to place patient No      DVT Prophylaxis  :   SCDs   Lab Results  Component Value Date   PLT 123 (L) 08/22/2020    Diet :  Diet Order  Diet NPO time specified  Diet effective now                  Inpatient Medications  Scheduled Meds: . sodium chloride   Intravenous Once  . atorvastatin  40 mg Oral Daily  . chlorhexidine  15 mL Mouth/Throat QID  . Chlorhexidine Gluconate Cloth  6 each Topical Q0600  . ferric citrate  420 mg Oral TID  . hydrOXYzine  50 mg Oral Q0600  . [START ON 08/23/2020] pantoprazole  40 mg Intravenous Q12H  . traZODone  150 mg Oral QHS   Continuous Infusions: . pantoprozole (PROTONIX) infusion 8 mg/hr (08/22/20 1043)   PRN Meds:.acetaminophen **OR** acetaminophen, levalbuterol, lidocaine, methocarbamol, nitroGLYCERIN, ondansetron **OR** ondansetron (ZOFRAN) IV, oxyCODONE  Antibiotics  :    Anti-infectives (From admission, onward)   Start     Dose/Rate Route Frequency Ordered Stop   08/20/20 0415  amoxicillin-clavulanate (AUGMENTIN) 500-125 MG per tablet 500 mg  Status:  Discontinued        1 tablet Oral Daily 08/20/20 0329 08/21/20 1258   08/19/20 2345  Ampicillin-Sulbactam (UNASYN) 3 g in sodium chloride 0.9 % 100 mL IVPB  Status:  Discontinued        3 g 200 mL/hr over 30 Minutes Intravenous  Once 08/19/20 2333 08/20/20 0329        Emeline Gins Evaline Waltman M.D on 08/22/2020 at 2:03 PM  To page go to [email or web address]   [facility] -  Office  [phone]    Objective:   Vitals:    08/22/20 1155 08/22/20 1210 08/22/20 1220 08/22/20 1230  BP: 127/68 118/67    Pulse: 74 73 76   Resp: 13 12 13    Temp:    (!) 97.4 F (36.3 C)  TempSrc:      SpO2: 100% 100% 100%   Weight:      Height:        Wt Readings from Last 3 Encounters:  08/22/20 50.3 kg  05/07/20 45.1 kg  03/29/20 50.2 kg     Intake/Output Summary (Last 24 hours) at 08/22/2020 1403 Last data filed at 08/22/2020 1128 Gross per 24 hour  Intake 4213.71 ml  Output 100 ml  Net 4113.71 ml     Physical Exam  Awake Alert, Oriented X 3, No new F.N deficits, Normal affect Symmetrical Chest wall movement, Good air movement bilaterally, CTAB RRR,No Gallops,Rubs or new Murmurs, No Parasternal Heave +ve B.Sounds, Abd Soft, No tenderness, No rebound - guarding or rigidity. No Cyanosis, Clubbing or edema, No new Rash or bruise        Data Review:    CBC Recent Labs  Lab 08/19/20 2125 08/20/20 0404 08/20/20 1956 08/21/20 0409 08/21/20 1819 08/22/20 0500  WBC 5.3 4.8  --  3.5*  --  3.8*  HGB 7.9* 6.4* 8.5* 7.6* 9.5* 9.0*  HCT 25.2* 19.9* 25.2* 21.9* 27.9* 26.1*  PLT 124* 167  --  136*  --  123*  MCV 103.7* 101.5*  --  92.8  --  92.9  MCH 32.5 32.7  --  32.2  --  32.0  MCHC 31.3 32.2  --  34.7  --  34.5  RDW 15.9* 16.1*  --  19.3*  --  17.9*    Recent Labs  Lab 08/19/20 1855 08/19/20 1916 08/20/20 0021 08/20/20 0404 08/20/20 1956 08/21/20 0409 08/22/20 0500  NA 138  --   --  136 134* 135 131*  K 5.2*  --   --  5.0 3.3* 3.7 4.1  CL 96*  --   --  97* 99 97* 94*  CO2 29  --   --  23 29 28 26   GLUCOSE 90  --   --  93 95 80 78  BUN 89*  --   --  96* 30* 34* 38*  CREATININE 9.27*  --   --  9.76* 4.61* 5.82* 7.98*  CALCIUM 9.2  --   --  9.5 9.0 9.2 9.3  AST  --   --   --  18  --  17  --   ALT  --   --   --  12  --  11  --   ALKPHOS  --   --   --  74  --  69  --   BILITOT  --   --   --  1.1  --  1.0  --   ALBUMIN  --   --   --  3.4*  --  3.0*  --   MG  --  1.9  --   --   --   --   --    LATICACIDVEN  --   --  0.7 1.6  --   --   --   INR 1.1  --   --   --   --   --   --     ------------------------------------------------------------------------------------------------------------------ No results for input(s): CHOL, HDL, LDLCALC, TRIG, CHOLHDL, LDLDIRECT in the last 72 hours.  No results found for: HGBA1C ------------------------------------------------------------------------------------------------------------------ No results for input(s): TSH, T4TOTAL, T3FREE, THYROIDAB in the last 72 hours.  Invalid input(s): FREET3  Cardiac Enzymes No results for input(s): CKMB, TROPONINI, MYOGLOBIN in the last 168 hours.  Invalid input(s): CK ------------------------------------------------------------------------------------------------------------------    Component Value Date/Time   BNP 3,725.8 (H) 05/05/2020 1602    Micro Results Recent Results (from the past 240 hour(s))  SARS CORONAVIRUS 2 (TAT 6-24 HRS) Nasopharyngeal Nasopharyngeal Swab     Status: None   Collection Time: 08/19/20  7:16 PM   Specimen: Nasopharyngeal Swab  Result Value Ref Range Status   SARS Coronavirus 2 NEGATIVE NEGATIVE Final    Comment: (NOTE) SARS-CoV-2 target nucleic acids are NOT DETECTED.  The SARS-CoV-2 RNA is generally detectable in upper and lower respiratory specimens during the acute phase of infection. Negative results do not preclude SARS-CoV-2 infection, do not rule out co-infections with other pathogens, and should not be used as the sole basis for treatment or other patient management decisions. Negative results must be combined with clinical observations, patient history, and epidemiological information. The expected result is Negative.  Fact Sheet for Patients: SugarRoll.be  Fact Sheet for Healthcare Providers: https://www.woods-mathews.com/  This test is not yet approved or cleared by the [place] FDA and  has been  authorized for detection and/or diagnosis of SARS-CoV-2 by FDA under an Emergency Use Authorization (EUA). This EUA will remain  in effect (meaning this test can be used) for the duration of the COVID-19 declaration under Se ction 564(b)(1) of the Act, 21 U.S.C. section 360bbb-3(b)(1), unless the authorization is terminated or revoked sooner.  Performed at [facility], [place] [address], [place], [place] [address]   Blood culture (routine x 2)     Status: None (Preliminary result)   Collection Time: 08/20/20 12:21 AM   Specimen: BLOOD  Result Value Ref Range Status   Specimen Description BLOOD SITE NOT SPECIFIED  Final   Special Requests   Final    BOTTLES DRAWN AEROBIC  AND ANAEROBIC Blood Culture adequate volume   Culture   Final    NO GROWTH 2 DAYS Performed at [facility], [place] [address], [place], [place] [address]    Report Status PENDING  Incomplete  Blood culture (routine x 2)     Status: None (Preliminary result)   Collection Time: 08/20/20  4:04 AM   Specimen: BLOOD  Result Value Ref Range Status   Specimen Description BLOOD SITE NOT SPECIFIED  Final   Special Requests AEROBIC BOTTLE ONLY Blood Culture adequate volume  Final   Culture   Final    NO GROWTH 2 DAYS Performed at [facility], [place] [address] [place], [place] [address]    Report Status PENDING  Incomplete    Radiology Reports DG Chest 2 View  Result Date: 08/19/2020 CLINICAL DATA:  Chest pain shortness of breath EXAM: CHEST - 2 VIEW COMPARISON:  Chest radiograph May 04, 2020 FINDINGS: Right subclavian/innominate stent, unchanged. Mild cardiac enlargement. Central vascular congestion. Mild basilar predominant interstitial opacities. The visualized skeletal structures are unremarkable. IMPRESSION: Mild cardiac enlargement with central vascular congestion and basilar predominant interstitial opacities, likely mild edema. Electronically Signed   By: Dahlia Bailiff MD   On: 08/19/2020 20:18    IR Veno/Jugular Right  Result Date: 08/20/2020 INDICATION: 65 year old with end-stage renal disease, hematochezia, anemia and poor venous access. Patient needs a central venous catheter. EXAM: PLACEMENT OF CENTRAL LINE WITH ULTRASOUND AND FLUOROSCOPIC GUIDANCE CENTRAL VENOGRAM COMPARISON:  None. MEDICATIONS: None ANESTHESIA/SEDATION: None FLUOROSCOPY TIME:  3 minutes, 48 seconds, 41.6 mGy) COMPLICATIONS: None immediate. PROCEDURE: The procedure, risks, benefits, and alternatives were explained to the patient. Questions regarding the procedure were encouraged and answered. The patient understands and consents to the procedure. Right side of the neck was evaluated with ultrasound. Ultrasound confirmed a patent right internal jugular vein. Ultrasound image was saved for documentation. Right side of the neck was prepped and draped in sterile fashion. Maximal barrier sterile technique was utilized including caps, mask, sterile gowns, sterile gloves, sterile drape, hand hygiene and skin antiseptic. Skin was anesthetized with 1% lidocaine. 21 gauge needle was directed into the right internal jugular vein but a wire would not advance centrally. Micropuncture dilator set was placed. Contrast was injected for a venogram. 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 Microcatheter was removed with manual compression. Dressing was placed. Ultrasound confirmed a patent left internal jugular vein. Ultrasound image was saved for documentation. Left side of the neck was prepped and draped in sterile fashion. Maximal barrier sterile technique was utilized including caps, mask, sterile gowns, sterile gloves, sterile drape, hand hygiene and skin antiseptic. Skin was anesthetized using 1% lidocaine. Small incision was made. 21 gauge needle was directed into the left internal jugular vein with ultrasound guidance. Micropuncture dilator set was placed. Wire would not easily advance into the SVC due to the  right innominate stent. Venogram was performed to confirm patency. Kumpe catheter and Glidewire were used to cannulate the SVC from the left jugular approach. A Platinum Plus wire was placed and the catheter was removed. Peel-away sheath was placed. A dual lumen Power PICC line was cut to 23 cm and advanced over the wire into the SVC. Wire was removed. Both lumens aspirated and flushed well. Catheter was sutured to the skin. Dressing was placed. 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 Wire would not advance into the SVC from right jugular approach. Ultrasound demonstrated occlusion of the  lower right internal jugular vein with collateral flow in the right shoulder region and near the midline. Central venogram from the left jugular approach confirmed patency of the left innominate vein but there is significant narrowing at the junction of the left innominate vein and SVC related to the right innominate vein stent. Eventually a Kumpe catheter and Glidewire were able to cannulate the SVC from a left jugular approach. The central venous catheter was positioned near the superior cavoatrial junction. IMPRESSION: Successful placement of a central venous catheter using ultrasound and fluoroscopic guidance. Catheter was placed from a left jugular approach. 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 Right jugular vein is patent in the neck but the lower portion of the right jugular vein is occluded from the right innominate vein stent. Patient is not a candidate for future right jugular central venous access. Electronically Signed   By: Markus Daft M.D.   On: 08/20/2020 15:15   IR Fluoro Guide CV Line Left  Result Date: 08/20/2020 INDICATION: 65 year old with end-stage renal disease, hematochezia, anemia and poor venous access. Patient needs a central venous catheter. EXAM: PLACEMENT OF CENTRAL LINE WITH ULTRASOUND AND  FLUOROSCOPIC GUIDANCE CENTRAL VENOGRAM COMPARISON:  None. MEDICATIONS: None ANESTHESIA/SEDATION: None FLUOROSCOPY TIME:  3 minutes, 48 seconds, 35.0 mGy) COMPLICATIONS: None immediate. PROCEDURE: The procedure, risks, benefits, and alternatives were explained to the patient. Questions regarding the procedure were encouraged and answered. The patient understands and consents to the procedure. Right side of the neck was evaluated with ultrasound. Ultrasound confirmed a patent right internal jugular vein. Ultrasound image was saved for documentation. Right side of the neck was prepped and draped in sterile fashion. Maximal barrier sterile technique was utilized including caps, mask, sterile gowns, sterile gloves, sterile drape, hand hygiene and skin antiseptic. Skin was anesthetized with 1% lidocaine. 21 gauge needle was directed into the right internal jugular vein but a wire would not advance centrally. Micropuncture dilator set was placed. Contrast was injected for a venogram. 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 Microcatheter was removed with manual compression. Dressing was placed. Ultrasound confirmed a patent left internal jugular vein. Ultrasound image was saved for documentation. Left side of the neck was prepped and draped in sterile fashion. Maximal barrier sterile technique was utilized including caps, mask, sterile gowns, sterile gloves, sterile drape, hand hygiene and skin antiseptic. Skin was anesthetized using 1% lidocaine. Small incision was made. 21 gauge needle was directed into the left internal jugular vein with ultrasound guidance. Micropuncture dilator set was placed. Wire would not easily advance into the SVC due to the right innominate stent. Venogram was performed to confirm patency. Kumpe catheter and Glidewire were used to cannulate the SVC from the left jugular approach. A Platinum Plus wire was placed and the catheter was removed. Peel-away sheath was  placed. A dual lumen Power PICC line was cut to 23 cm and advanced over the wire into the SVC. Wire was removed. Both lumens aspirated and flushed well. Catheter was sutured to the skin. Dressing was placed. 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 Wire would not advance into the SVC from right jugular approach. 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 Central venogram from the left jugular approach confirmed patency of the left innominate vein but there is significant narrowing at  the junction of the left innominate vein and SVC related to the right innominate vein stent. Eventually a Kumpe catheter and Glidewire were able to cannulate the SVC from a left jugular approach. The central venous catheter was positioned near the superior cavoatrial junction. IMPRESSION: Successful placement of a central venous catheter using ultrasound and fluoroscopic guidance. Catheter was placed from a left jugular approach. 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 Right jugular vein is patent in the neck but the lower portion of the right jugular vein is occluded from the right innominate vein stent. Patient is not a candidate for future right jugular central venous access. Electronically Signed   By: Markus Daft M.D.   On: 08/20/2020 15:15   IR US Guide Vasc Access Left  Result Date: 08/20/2020 INDICATION: 65 year old with end-stage renal disease, hematochezia, anemia and poor venous access. Patient needs a central venous catheter. EXAM: PLACEMENT OF CENTRAL LINE WITH ULTRASOUND AND FLUOROSCOPIC GUIDANCE CENTRAL VENOGRAM COMPARISON:  None. MEDICATIONS: None ANESTHESIA/SEDATION: None FLUOROSCOPY TIME:  3 minutes, 48 seconds, 89.3 mGy) COMPLICATIONS: None immediate. PROCEDURE: The procedure, risks, benefits, and alternatives  were explained to the patient. Questions regarding the procedure were encouraged and answered. The patient understands and consents to the procedure. Right side of the neck was evaluated with ultrasound. Ultrasound confirmed a patent right internal jugular vein. Ultrasound image was saved for documentation. Right side of the neck was prepped and draped in sterile fashion. Maximal barrier sterile technique was utilized including caps, mask, sterile gowns, sterile gloves, sterile drape, hand hygiene and skin antiseptic. Skin was anesthetized with 1% lidocaine. 21 gauge needle was directed into the right internal jugular vein but a wire would not advance centrally. Micropuncture dilator set was placed. Contrast was injected for a venogram. 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 Microcatheter was removed with manual compression. Dressing was placed. Ultrasound confirmed a patent left internal jugular vein. Ultrasound image was saved for documentation. Left side of the neck was prepped and draped in sterile fashion. Maximal barrier sterile technique was utilized including caps, mask, sterile gowns, sterile gloves, sterile drape, hand hygiene and skin antiseptic. Skin was anesthetized using 1% lidocaine. Small incision was made. 21 gauge needle was directed into the left internal jugular vein with ultrasound guidance. Micropuncture dilator set was placed. Wire would not easily advance into the SVC due to the right innominate stent. Venogram was performed to confirm patency. Kumpe catheter and Glidewire were used to cannulate the SVC from the left jugular approach. A Platinum Plus wire was placed and the catheter was removed. Peel-away sheath was placed. A dual lumen Power PICC line was cut to 23 cm and advanced over the wire into the SVC. Wire was removed. Both lumens aspirated and flushed well. Catheter was sutured to the skin. Dressing was placed. FINDINGS: Patient has a stent in the  right innominate vein. Wire would not advance into the SVC from right jugular approach. 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 Central venogram from the left jugular approach confirmed patency of the left innominate vein but there is significant narrowing at the junction of the left innominate vein and SVC related to the right innominate vein stent. Eventually a Kumpe catheter and Glidewire were able to cannulate the SVC from a left jugular approach. The central venous catheter  was positioned near the superior cavoatrial junction. IMPRESSION: Successful placement of a central venous catheter using ultrasound and fluoroscopic guidance. Catheter was placed from a left jugular approach. 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 Right jugular vein is patent in the neck but the lower portion of the right jugular vein is occluded from the right innominate vein stent. Patient is not a candidate for future right jugular central venous access. Electronically Signed   By: Markus Daft M.D.   On: 08/20/2020 15:15   IR US Guide Vasc Access Right  Result Date: 08/20/2020 INDICATION: 65 year old with end-stage renal disease, hematochezia, anemia and poor venous access. Patient needs a central venous catheter. EXAM: PLACEMENT OF CENTRAL LINE WITH ULTRASOUND AND FLUOROSCOPIC GUIDANCE CENTRAL VENOGRAM COMPARISON:  None. MEDICATIONS: None ANESTHESIA/SEDATION: None FLUOROSCOPY TIME:  3 minutes, 48 seconds, 19.6 mGy) COMPLICATIONS: None immediate. PROCEDURE: The procedure, risks, benefits, and alternatives were explained to the patient. Questions regarding the procedure were encouraged and answered. The patient understands and consents to the procedure. Right side of the neck was evaluated with ultrasound. Ultrasound confirmed a patent right  internal jugular vein. Ultrasound image was saved for documentation. Right side of the neck was prepped and draped in sterile fashion. Maximal barrier sterile technique was utilized including caps, mask, sterile gowns, sterile gloves, sterile drape, hand hygiene and skin antiseptic. Skin was anesthetized with 1% lidocaine. 21 gauge needle was directed into the right internal jugular vein but a wire would not advance centrally. Micropuncture dilator set was placed. Contrast was injected for a venogram. 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 Microcatheter was removed with manual compression. Dressing was placed. Ultrasound confirmed a patent left internal jugular vein. Ultrasound image was saved for documentation. Left side of the neck was prepped and draped in sterile fashion. Maximal barrier sterile technique was utilized including caps, mask, sterile gowns, sterile gloves, sterile drape, hand hygiene and skin antiseptic. Skin was anesthetized using 1% lidocaine. Small incision was made. 21 gauge needle was directed into the left internal jugular vein with ultrasound guidance. Micropuncture dilator set was placed. Wire would not easily advance into the SVC due to the right innominate stent. Venogram was performed to confirm patency. Kumpe catheter and Glidewire were used to cannulate the SVC from the left jugular approach. A Platinum Plus wire was placed and the catheter was removed. Peel-away sheath was placed. A dual lumen Power PICC line was cut to 23 cm and advanced over the wire into the SVC. Wire was removed. Both lumens aspirated and flushed well. Catheter was sutured to the skin. Dressing was placed. 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 Wire would not advance into the SVC from right jugular approach. 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 Central  venogram from the left jugular approach confirmed patency of the left innominate vein but there is significant narrowing at the junction of the left innominate vein and SVC related to the right innominate vein stent. Eventually a Kumpe catheter and Glidewire were able to cannulate the SVC from a left jugular approach. The central venous catheter was positioned near the superior cavoatrial junction. IMPRESSION: Successful placement of a central venous catheter using ultrasound and fluoroscopic guidance. Catheter was placed from a left jugular approach. 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 Right jugular vein is patent in the neck but the lower portion of the right jugular vein is occluded from the right innominate vein stent. Patient is not a candidate for future right jugular central venous access. Electronically Signed   By: Markus Daft M.D.   On: 08/20/2020 15:15   DG CHEST PORT 1 VIEW  Result Date: 08/22/2020 CLINICAL DATA:  Hypoxia EXAM: PORTABLE CHEST 1 VIEW COMPARISON:  August 19, 2020 FINDINGS: Endotracheal tube tip is 3.4 cm above the carina. Central catheter tip is at the cavoatrial junction. No pneumothorax. The lungs are clear. Heart is mildly enlarged with pulmonary vascularity normal. No adenopathy. There is a right innominate/subclavian region stent, unchanged in position. IMPRESSION: Tube and catheter positions as described without pneumothorax. No edema or airspace opacity. Stable cardiac prominence. Electronically Signed   By: Lowella Grip III M.D.   On: 08/22/2020 11:03   CT Angio Abd/Pel w/ and/or w/o  Result Date: 08/21/2020 CLINICAL DATA:  Melena, gastrointestinal bleeding EXAM: CTA ABDOMEN AND PELVIS WITHOUT AND WITH CONTRAST TECHNIQUE: Multidetector CT imaging of the abdomen and pelvis was performed using the standard protocol during bolus administration of intravenous contrast. Multiplanar  reconstructed images and MIPs were obtained and reviewed to evaluate the vascular anatomy. CONTRAST:  159mL OMNIPAQUE IOHEXOL 350 MG/ML SOLN COMPARISON:  03/27/2018 FINDINGS: VASCULAR Aorta: Normal caliber aorta without aneurysm, dissection, vasculitis or significant stenosis. Diffuse atherosclerosis. Celiac: Patent without evidence of aneurysm, dissection, vasculitis or significant stenosis. SMA: The SMA is patent at its origin without focal stenosis, aneurysm, or dissection. Distal branches of the SMA supplying the bowel in the right lower quadrant demonstrates significant atheromatous plaque and multifocal high-grade stenoses. Renals: There is high-grade stenosis at the origin of the left renal artery. Moderate stenosis is seen within the origin of the right renal artery. No dissection, aneurysm, or fibromuscular dysplasia. IMA: Patent without evidence of aneurysm, dissection, vasculitis or significant stenosis. Significant atherosclerosis. Inflow: There is significant atheromatous plaque within the bilateral common iliac, external iliac, and internal iliac arteries. No high-grade stenosis. Proximal Outflow: There is extensive atherosclerosis throughout the visualized portions of the common femoral and superficial femoral arteries. No high-grade stenosis. Veins: No obvious venous abnormality within the limitations of this arterial phase study. Review of the MIP images confirms the above findings. NON-VASCULAR Lower chest: No acute pleural or parenchymal lung disease. Scattered emphysematous changes are noted. Hepatobiliary: Numerous cysts are seen throughout the liver. No other focal abnormalities. The gallbladder is unremarkable. Pancreas: Unremarkable. No pancreatic ductal dilatation or surrounding inflammatory changes. Spleen: Normal in size without focal abnormality. Adrenals/Urinary Tract: Extensive bilateral renal cortical atrophy with diffuse cystic changes consistent with history of end-stage renal  disease. Adrenals are unremarkable. The bladder is decompressed. Stomach/Bowel: No bowel obstruction or ileus. No bowel wall thickening or inflammatory change. No evidence of contrast accumulation within the bowel lumen to suggest active gastrointestinal hemorrhage. Lymphatic: No pathologic adenopathy. Reproductive: Surgical clips are seen within the prostate bed. Other: No free fluid or free gas.  No abdominal wall hernia. Musculoskeletal: Diffuse changes of renal osteodystrophy. No acute bony abnormalities. Reconstructed images demonstrate no additional findings. IMPRESSION: VASCULAR 1. No evidence of active gastrointestinal hemorrhage. 2. Extensive atherosclerosis of the aorta and its branches. Focal areas of high-grade stenosis within distal branches of the SMA in the right lower quadrant. High-grade bilateral renal artery stenosis, left greater than right. NON-VASCULAR 1. Numerous bilateral hepatic cysts unchanged. 2. Bilateral  renal cortical atrophy with diffuse cystic change, consistent with known end-stage renal disease. 3. No acute intra-abdominal or intrapelvic process. 4. Aortic Atherosclerosis (ICD10-I70.0) and Emphysema (ICD10-J43.9). Electronically Signed   By: Randa Ngo M.D.   On: 08/21/2020 00:02

## 2020-08-23 ENCOUNTER — Encounter (HOSPITAL_COMMUNITY): Payer: Self-pay | Admitting: Gastroenterology

## 2020-08-23 ENCOUNTER — Inpatient Hospital Stay (HOSPITAL_COMMUNITY): Payer: Medicare HMO | Admitting: Anesthesiology

## 2020-08-23 ENCOUNTER — Encounter (HOSPITAL_COMMUNITY)
Admission: EM | Discharge status: Against Medical Advice | Payer: Self-pay | Source: Home / Self Care | Attending: Internal Medicine

## 2020-08-23 ENCOUNTER — Inpatient Hospital Stay (HOSPITAL_COMMUNITY): Payer: Medicare HMO

## 2020-08-23 DIAGNOSIS — Z992 Dependence on renal dialysis: Secondary | ICD-10-CM | POA: Diagnosis not present

## 2020-08-23 DIAGNOSIS — R109 Unspecified abdominal pain: Secondary | ICD-10-CM

## 2020-08-23 DIAGNOSIS — R579 Shock, unspecified: Secondary | ICD-10-CM | POA: Diagnosis not present

## 2020-08-23 DIAGNOSIS — N186 End stage renal disease: Secondary | ICD-10-CM | POA: Diagnosis not present

## 2020-08-23 DIAGNOSIS — R1010 Upper abdominal pain, unspecified: Secondary | ICD-10-CM | POA: Diagnosis not present

## 2020-08-23 DIAGNOSIS — J9600 Acute respiratory failure, unspecified whether with hypoxia or hypercapnia: Secondary | ICD-10-CM

## 2020-08-23 DIAGNOSIS — J9601 Acute respiratory failure with hypoxia: Secondary | ICD-10-CM

## 2020-08-23 HISTORY — PX: LAPAROTOMY: SHX154

## 2020-08-23 HISTORY — PX: SPLENECTOMY, TOTAL: SHX788

## 2020-08-23 HISTORY — PX: APPLICATION OF WOUND VAC: SHX5189

## 2020-08-23 LAB — BLOOD GAS, ARTERIAL
Acid-base deficit: 7.6 mmol/L — ABNORMAL HIGH (ref 0.0–2.0)
Bicarbonate: 18.3 mmol/L — ABNORMAL LOW (ref 20.0–28.0)
Drawn by: 53527
FIO2: 100
O2 Saturation: 59 %
Patient temperature: 35
pCO2 arterial: 39 mmHg (ref 32.0–48.0)
pH, Arterial: 7.28 — ABNORMAL LOW (ref 7.350–7.450)
pO2, Arterial: 31.8 mmHg — CL (ref 83.0–108.0)

## 2020-08-23 LAB — POCT I-STAT 7, (LYTES, BLD GAS, ICA,H+H)
Acid-base deficit: 10 mmol/L — ABNORMAL HIGH (ref 0.0–2.0)
Acid-base deficit: 8 mmol/L — ABNORMAL HIGH (ref 0.0–2.0)
Acid-base deficit: 5 mmol/L — ABNORMAL HIGH (ref 0.0–2.0)
Bicarbonate: 16.8 mmol/L — ABNORMAL LOW (ref 20.0–28.0)
Bicarbonate: 17.9 mmol/L — ABNORMAL LOW (ref 20.0–28.0)
Bicarbonate: 18.6 mmol/L — ABNORMAL LOW (ref 20.0–28.0)
Calcium, Ion: 0.37 mmol/L — CL (ref 1.15–1.40)
Calcium, Ion: 0.82 mmol/L — CL (ref 1.15–1.40)
Calcium, Ion: 0.86 mmol/L — CL (ref 1.15–1.40)
HCT: 24 % — ABNORMAL LOW (ref 39.0–52.0)
HCT: 26 % — ABNORMAL LOW (ref 39.0–52.0)
HCT: 27 % — ABNORMAL LOW (ref 39.0–52.0)
Hemoglobin: 8.2 g/dL — ABNORMAL LOW (ref 13.0–17.0)
Hemoglobin: 8.8 g/dL — ABNORMAL LOW (ref 13.0–17.0)
Hemoglobin: 9.2 g/dL — ABNORMAL LOW (ref 13.0–17.0)
O2 Saturation: 100 %
O2 Saturation: 100 %
O2 Saturation: 100 %
Patient temperature: 96.9
Potassium: 3.8 mmol/L (ref 3.5–5.1)
Potassium: 5 mmol/L (ref 3.5–5.1)
Potassium: 4 mmol/L (ref 3.5–5.1)
Sodium: 137 mmol/L (ref 135–145)
Sodium: 137 mmol/L (ref 135–145)
Sodium: 138 mmol/L (ref 135–145)
TCO2: 18 mmol/L — ABNORMAL LOW (ref 22–32)
TCO2: 19 mmol/L — ABNORMAL LOW (ref 22–32)
TCO2: 19 mmol/L — ABNORMAL LOW (ref 22–32)
pCO2 arterial: 37.5 mmHg (ref 32.0–48.0)
pCO2 arterial: 39.7 mmHg (ref 32.0–48.0)
pCO2 arterial: 27.7 mmHg — ABNORMAL LOW (ref 32.0–48.0)
pH, Arterial: 7.235 — ABNORMAL LOW (ref 7.350–7.450)
pH, Arterial: 7.287 — ABNORMAL LOW (ref 7.350–7.450)
pH, Arterial: 7.43 (ref 7.350–7.450)
pO2, Arterial: 515 mmHg — ABNORMAL HIGH (ref 83.0–108.0)
pO2, Arterial: 562 mmHg — ABNORMAL HIGH (ref 83.0–108.0)
pO2, Arterial: 570 mmHg — ABNORMAL HIGH (ref 83.0–108.0)

## 2020-08-23 LAB — CBC
HCT: 20.2 % — ABNORMAL LOW (ref 39.0–52.0)
HCT: 22.9 % — ABNORMAL LOW (ref 39.0–52.0)
HCT: 29.1 % — ABNORMAL LOW (ref 39.0–52.0)
HCT: 27.5 % — ABNORMAL LOW (ref 39.0–52.0)
Hemoglobin: 6.9 g/dL — CL (ref 13.0–17.0)
Hemoglobin: 8 g/dL — ABNORMAL LOW (ref 13.0–17.0)
Hemoglobin: 10.5 g/dL — ABNORMAL LOW (ref 13.0–17.0)
Hemoglobin: 9.7 g/dL — ABNORMAL LOW (ref 13.0–17.0)
MCH: 32.2 pg (ref 26.0–34.0)
MCH: 31.7 pg (ref 26.0–34.0)
MCH: 30.8 pg (ref 26.0–34.0)
MCH: 30.3 pg (ref 26.0–34.0)
MCHC: 34.2 g/dL (ref 30.0–36.0)
MCHC: 34.9 g/dL (ref 30.0–36.0)
MCHC: 36.1 g/dL — ABNORMAL HIGH (ref 30.0–36.0)
MCHC: 35.3 g/dL (ref 30.0–36.0)
MCV: 94.4 fL (ref 80.0–100.0)
MCV: 90.9 fL (ref 80.0–100.0)
MCV: 85.3 fL (ref 80.0–100.0)
MCV: 85.9 fL (ref 80.0–100.0)
Platelets: 149 10*3/uL — ABNORMAL LOW (ref 150–400)
Platelets: 130 10*3/uL — ABNORMAL LOW (ref 150–400)
Platelets: 84 10*3/uL — ABNORMAL LOW (ref 150–400)
Platelets: 78 10*3/uL — ABNORMAL LOW (ref 150–400)
RBC: 2.14 MIL/uL — ABNORMAL LOW (ref 4.22–5.81)
RBC: 2.52 MIL/uL — ABNORMAL LOW (ref 4.22–5.81)
RBC: 3.41 MIL/uL — ABNORMAL LOW (ref 4.22–5.81)
RBC: 3.2 MIL/uL — ABNORMAL LOW (ref 4.22–5.81)
RDW: 17.2 % — ABNORMAL HIGH (ref 11.5–15.5)
RDW: 16.7 % — ABNORMAL HIGH (ref 11.5–15.5)
RDW: 14 % (ref 11.5–15.5)
RDW: 14.1 % (ref 11.5–15.5)
WBC: 6.3 10*3/uL (ref 4.0–10.5)
WBC: 5.8 10*3/uL (ref 4.0–10.5)
WBC: 9.2 10*3/uL (ref 4.0–10.5)
WBC: 2.4 10*3/uL — ABNORMAL LOW (ref 4.0–10.5)
nRBC: 0 % (ref 0.0–0.2)
nRBC: 0 % (ref 0.0–0.2)
nRBC: 0.3 % — ABNORMAL HIGH (ref 0.0–0.2)
nRBC: 1.7 % — ABNORMAL HIGH (ref 0.0–0.2)

## 2020-08-23 LAB — CBC WITH DIFFERENTIAL/PLATELET
Abs Immature Granulocytes: 0.03 10*3/uL (ref 0.00–0.07)
Basophils Absolute: 0 10*3/uL (ref 0.0–0.1)
Basophils Relative: 0 %
Eosinophils Absolute: 0 10*3/uL (ref 0.0–0.5)
Eosinophils Relative: 0 %
HCT: 14.1 % — ABNORMAL LOW (ref 39.0–52.0)
Hemoglobin: 4.6 g/dL — CL (ref 13.0–17.0)
Immature Granulocytes: 1 %
Lymphocytes Relative: 21 %
Lymphs Abs: 0.9 10*3/uL (ref 0.7–4.0)
MCH: 31.1 pg (ref 26.0–34.0)
MCHC: 32.6 g/dL (ref 30.0–36.0)
MCV: 95.3 fL (ref 80.0–100.0)
Monocytes Absolute: 0.5 10*3/uL (ref 0.1–1.0)
Monocytes Relative: 10 %
Neutro Abs: 3.1 10*3/uL (ref 1.7–7.7)
Neutrophils Relative %: 68 %
Platelets: 103 10*3/uL — ABNORMAL LOW (ref 150–400)
RBC: 1.48 MIL/uL — ABNORMAL LOW (ref 4.22–5.81)
RDW: 17.5 % — ABNORMAL HIGH (ref 11.5–15.5)
WBC: 4.5 10*3/uL (ref 4.0–10.5)
nRBC: 0 % (ref 0.0–0.2)

## 2020-08-23 LAB — DIC (DISSEMINATED INTRAVASCULAR COAGULATION)PANEL
D-Dimer, Quant: 2.17 ug/mL-FEU — ABNORMAL HIGH (ref 0.00–0.50)
Fibrinogen: 255 mg/dL (ref 210–475)
INR: 1.4 — ABNORMAL HIGH (ref 0.8–1.2)
Platelets: 78 10*3/uL — ABNORMAL LOW (ref 150–400)
Prothrombin Time: 16.9 seconds — ABNORMAL HIGH (ref 11.4–15.2)
Smear Review: NONE SEEN
aPTT: 34 seconds (ref 24–36)

## 2020-08-23 LAB — BASIC METABOLIC PANEL
Anion gap: 14 (ref 5–15)
Anion gap: 14 (ref 5–15)
BUN: 47 mg/dL — ABNORMAL HIGH (ref 8–23)
BUN: 32 mg/dL — ABNORMAL HIGH (ref 8–23)
CO2: 21 mmol/L — ABNORMAL LOW (ref 22–32)
CO2: 21 mmol/L — ABNORMAL LOW (ref 22–32)
Calcium: 9.2 mg/dL (ref 8.9–10.3)
Calcium: 8.1 mg/dL — ABNORMAL LOW (ref 8.9–10.3)
Chloride: 95 mmol/L — ABNORMAL LOW (ref 98–111)
Chloride: 103 mmol/L (ref 98–111)
Creatinine, Ser: 9.81 mg/dL — ABNORMAL HIGH (ref 0.61–1.24)
Creatinine, Ser: 6.52 mg/dL — ABNORMAL HIGH (ref 0.61–1.24)
GFR, Estimated: 5 mL/min — ABNORMAL LOW (ref >60)
GFR, Estimated: 9 mL/min — ABNORMAL LOW (ref >60)
Glucose, Bld: 88 mg/dL (ref 70–99)
Glucose, Bld: 207 mg/dL — ABNORMAL HIGH (ref 70–99)
Potassium: 4.4 mmol/L (ref 3.5–5.1)
Potassium: 3.7 mmol/L (ref 3.5–5.1)
Sodium: 130 mmol/L — ABNORMAL LOW (ref 135–145)
Sodium: 138 mmol/L (ref 135–145)

## 2020-08-23 LAB — HEMOGLOBIN AND HEMATOCRIT, BLOOD
HCT: 15.6 % — ABNORMAL LOW (ref 39.0–52.0)
HCT: 26.3 % — ABNORMAL LOW (ref 39.0–52.0)
Hemoglobin: 5 g/dL — CL (ref 13.0–17.0)
Hemoglobin: 9.5 g/dL — ABNORMAL LOW (ref 13.0–17.0)

## 2020-08-23 LAB — HEPATIC FUNCTION PANEL
ALT: 13 U/L (ref 0–44)
AST: 28 U/L (ref 15–41)
Albumin: 3 g/dL — ABNORMAL LOW (ref 3.5–5.0)
Alkaline Phosphatase: 76 U/L (ref 38–126)
Bilirubin, Direct: 0.1 mg/dL (ref 0.0–0.2)
Indirect Bilirubin: 0.7 mg/dL (ref 0.3–0.9)
Total Bilirubin: 0.8 mg/dL (ref 0.3–1.2)
Total Protein: 5.8 g/dL — ABNORMAL LOW (ref 6.5–8.1)

## 2020-08-23 LAB — LIPASE, BLOOD: Lipase: 26 U/L (ref 11–51)

## 2020-08-23 LAB — TROPONIN I (HIGH SENSITIVITY)
Troponin I (High Sensitivity): 117 ng/L (ref <18)
Troponin I (High Sensitivity): 97 ng/L — ABNORMAL HIGH (ref <18)

## 2020-08-23 LAB — PROTIME-INR
INR: 1.5 — ABNORMAL HIGH (ref 0.8–1.2)
INR: 1.4 — ABNORMAL HIGH (ref 0.8–1.2)
Prothrombin Time: 17.2 seconds — ABNORMAL HIGH (ref 11.4–15.2)
Prothrombin Time: 16.5 seconds — ABNORMAL HIGH (ref 11.4–15.2)

## 2020-08-23 LAB — GLUCOSE, CAPILLARY
Glucose-Capillary: 132 mg/dL — ABNORMAL HIGH (ref 70–99)
Glucose-Capillary: 133 mg/dL — ABNORMAL HIGH (ref 70–99)

## 2020-08-23 LAB — SURGICAL PATHOLOGY

## 2020-08-23 LAB — PREPARE RBC (CROSSMATCH)

## 2020-08-23 LAB — LACTIC ACID, PLASMA
Lactic Acid, Venous: 6.3 mmol/L (ref 0.5–1.9)
Lactic Acid, Venous: 6.3 mmol/L (ref 0.5–1.9)

## 2020-08-23 SURGERY — LAPAROTOMY, EXPLORATORY
Anesthesia: General | Site: Abdomen

## 2020-08-23 MED ORDER — ROCURONIUM BROMIDE 10 MG/ML (PF) SYRINGE
PREFILLED_SYRINGE | INTRAVENOUS | Status: AC
  Administered 2020-08-23: 80 mg
  Filled 2020-08-23: qty 10

## 2020-08-23 MED ORDER — METHYLPREDNISOLONE SODIUM SUCC 125 MG IJ SOLR
INTRAMUSCULAR | Status: AC
  Administered 2020-08-23: 125 mg via INTRAVENOUS
  Filled 2020-08-23: qty 2

## 2020-08-23 MED ORDER — SODIUM CHLORIDE 0.9 % IV SOLN: INTRAVENOUS | Status: DC | PRN

## 2020-08-23 MED ORDER — MIDAZOLAM HCL 2 MG/2ML IJ SOLN
INTRAMUSCULAR | Status: DC | PRN
  Administered 2020-08-23: 2 mg via INTRAVENOUS

## 2020-08-23 MED ORDER — METHYLPREDNISOLONE SODIUM SUCC 125 MG IJ SOLR: 100.0000 mg | Freq: Once | INTRAMUSCULAR | Status: DC

## 2020-08-23 MED ORDER — FENTANYL CITRATE (PF) 100 MCG/2ML IJ SOLN
INTRAMUSCULAR | Status: AC
  Administered 2020-08-23: 50 ug via INTRAVENOUS
  Filled 2020-08-23: qty 2

## 2020-08-23 MED ORDER — SODIUM BICARBONATE 8.4 % IV SOLN
INTRAVENOUS | Status: AC
  Administered 2020-08-23: 100 meq via INTRAVENOUS
  Filled 2020-08-23: qty 100

## 2020-08-23 MED ORDER — FENTANYL CITRATE (PF) 100 MCG/2ML IJ SOLN
INTRAMUSCULAR | Status: AC
  Filled 2020-08-23: qty 2

## 2020-08-23 MED ORDER — DIPHENHYDRAMINE HCL 50 MG/ML IJ SOLN: 25.0000 mg | Freq: Once | INTRAMUSCULAR | Status: DC

## 2020-08-23 MED ORDER — FENTANYL CITRATE (PF) 100 MCG/2ML IJ SOLN: 12.5000 ug | Freq: Once | INTRAMUSCULAR | Status: DC

## 2020-08-23 MED ORDER — SODIUM CHLORIDE 0.9% IV SOLUTION: Freq: Once | INTRAVENOUS | Status: AC

## 2020-08-23 MED ORDER — CHLORHEXIDINE GLUCONATE CLOTH 2 % EX PADS
6.0000 | MEDICATED_PAD | Freq: Every day | CUTANEOUS | Status: DC
  Administered 2020-08-25: 6 via TOPICAL

## 2020-08-23 MED ORDER — ACETAMINOPHEN 10 MG/ML IV SOLN
1000.0000 mg | Freq: Four times a day (QID) | INTRAVENOUS | Status: AC
  Administered 2020-08-24 (×3): 1000 mg via INTRAVENOUS
  Filled 2020-08-23 (×4): qty 100

## 2020-08-23 MED ORDER — FENTANYL CITRATE (PF) 100 MCG/2ML IJ SOLN: 50.0000 ug | Freq: Once | INTRAMUSCULAR | Status: DC

## 2020-08-23 MED ORDER — LACTATED RINGERS IV SOLN: INTRAVENOUS | Status: DC | PRN

## 2020-08-23 MED ORDER — PHENYLEPHRINE HCL-NACL 10-0.9 MG/250ML-% IV SOLN
INTRAVENOUS | Status: AC
  Filled 2020-08-23: qty 250

## 2020-08-23 MED ORDER — DIPHENHYDRAMINE HCL 50 MG/ML IJ SOLN
INTRAMUSCULAR | Status: DC | PRN
  Administered 2020-08-23: 25 mg via INTRAVENOUS

## 2020-08-23 MED ORDER — PROPOFOL 1000 MG/100ML IV EMUL
INTRAVENOUS | Status: AC
  Filled 2020-08-23: qty 100

## 2020-08-23 MED ORDER — MIDAZOLAM HCL 2 MG/2ML IJ SOLN
2.0000 mg | INTRAMUSCULAR | Status: DC | PRN
  Administered 2020-08-23: 2 mg via INTRAVENOUS
  Filled 2020-08-23 (×2): qty 2

## 2020-08-23 MED ORDER — EPINEPHRINE HCL 5 MG/250ML IV SOLN IN NS
0.5000 ug/min | INTRAVENOUS | Status: DC
  Filled 2020-08-23: qty 250

## 2020-08-23 MED ORDER — ORAL CARE MOUTH RINSE
15.0000 mL | OROMUCOSAL | Status: DC
  Administered 2020-08-23: 15 mL via OROMUCOSAL

## 2020-08-23 MED ORDER — HEMOSTATIC AGENTS (NO CHARGE) OPTIME
TOPICAL | Status: DC | PRN
  Administered 2020-08-23: 1 via TOPICAL

## 2020-08-23 MED ORDER — VANCOMYCIN HCL 1000 MG/200ML IV SOLN
1000.0000 mg | Freq: Once | INTRAVENOUS | Status: DC
  Administered 2020-08-23: 1000 mg via INTRAVENOUS
  Filled 2020-08-23 (×2): qty 200

## 2020-08-23 MED ORDER — ROCURONIUM BROMIDE 50 MG/5ML IV SOLN
80.0000 mg | Freq: Once | INTRAVENOUS | Status: DC
  Filled 2020-08-23: qty 8

## 2020-08-23 MED ORDER — CHLORHEXIDINE GLUCONATE 0.12% ORAL RINSE (MEDLINE KIT)
15.0000 mL | Freq: Two times a day (BID) | OROMUCOSAL | Status: DC
  Administered 2020-08-23 – 2020-08-29 (×12): 15 mL via OROMUCOSAL

## 2020-08-23 MED ORDER — DOPAMINE-DEXTROSE 3.2-5 MG/ML-% IV SOLN
0.0000 ug/kg/min | INTRAVENOUS | Status: DC
  Filled 2020-08-23: qty 250

## 2020-08-23 MED ORDER — MIDAZOLAM HCL 2 MG/2ML IJ SOLN
INTRAMUSCULAR | Status: AC
  Filled 2020-08-23: qty 2

## 2020-08-23 MED ORDER — SODIUM CHLORIDE 0.9 % IV SOLN
INTRAVENOUS | Status: DC | PRN
  Administered 2020-08-23 – 2020-08-25 (×2): 1000 mL via INTRAVENOUS

## 2020-08-23 MED ORDER — EPINEPHRINE HCL 5 MG/250ML IV SOLN IN NS
INTRAVENOUS | Status: AC
  Administered 2020-08-23: 10 ug/min via INTRAVENOUS
  Filled 2020-08-23: qty 250

## 2020-08-23 MED ORDER — FENTANYL BOLUS VIA INFUSION
50.0000 ug | INTRAVENOUS | Status: DC | PRN
Start: 2020-08-23 — End: 2020-08-27
  Administered 2020-08-24: 50 ug via INTRAVENOUS
  Administered 2020-08-24: 100 ug via INTRAVENOUS
  Administered 2020-08-24 – 2020-08-26 (×6): 50 ug via INTRAVENOUS
  Filled 2020-08-23: qty 100

## 2020-08-23 MED ORDER — EPINEPHRINE 1 MG/10ML IJ SOSY
PREFILLED_SYRINGE | INTRAMUSCULAR | Status: AC
  Filled 2020-08-23: qty 10

## 2020-08-23 MED ORDER — MIDAZOLAM HCL 2 MG/2ML IJ SOLN
2.0000 mg | INTRAMUSCULAR | Status: DC | PRN
  Administered 2020-08-24: 2 mg via INTRAVENOUS
  Administered 2020-08-24: 1 mg via INTRAVENOUS
  Filled 2020-08-23: qty 2

## 2020-08-23 MED ORDER — MIDAZOLAM HCL 2 MG/2ML IJ SOLN
INTRAMUSCULAR | Status: AC
  Administered 2020-08-23: 2 mg via INTRAVENOUS
  Filled 2020-08-23: qty 2

## 2020-08-23 MED ORDER — VASOPRESSIN 20 UNIT/ML IV SOLN
INTRAVENOUS | Status: AC
  Filled 2020-08-23: qty 1

## 2020-08-23 MED ORDER — IOHEXOL 300 MG/ML  SOLN
100.0000 mL | Freq: Once | INTRAMUSCULAR | Status: AC | PRN
  Administered 2020-08-23: 100 mL via INTRAVENOUS

## 2020-08-23 MED ORDER — VANCOMYCIN HCL 1000 MG/200ML IV SOLN: 1000.0000 mg | Freq: Once | INTRAVENOUS | Status: DC

## 2020-08-23 MED ORDER — KETAMINE HCL 50 MG/5ML IJ SOSY
PREFILLED_SYRINGE | INTRAMUSCULAR | Status: AC
  Filled 2020-08-23: qty 5

## 2020-08-23 MED ORDER — NOREPINEPHRINE 4 MG/250ML-% IV SOLN
0.0000 ug/min | INTRAVENOUS | Status: DC
  Administered 2020-08-23: 2 ug/min via INTRAVENOUS
  Administered 2020-08-23: 10 ug/min via INTRAVENOUS
  Administered 2020-08-24: 8 ug/min via INTRAVENOUS
  Administered 2020-08-24: 14 ug/min via INTRAVENOUS
  Administered 2020-08-24: 18 ug/min via INTRAVENOUS
  Administered 2020-08-24: 12 ug/min via INTRAVENOUS
  Administered 2020-08-25: 3 ug/min via INTRAVENOUS
  Administered 2020-08-25: 6 ug/min via INTRAVENOUS
  Filled 2020-08-23 (×8): qty 250

## 2020-08-23 MED ORDER — VASOPRESSIN 20 UNIT/ML IV SOLN
INTRAVENOUS | Status: DC | PRN
  Administered 2020-08-23: 2 [IU] via INTRAVENOUS

## 2020-08-23 MED ORDER — PIPERACILLIN-TAZOBACTAM 3.375 G IVPB: 3.3750 g | Freq: Two times a day (BID) | INTRAVENOUS | Status: DC

## 2020-08-23 MED ORDER — PIPERACILLIN-TAZOBACTAM IN DEX 2-0.25 GM/50ML IV SOLN
2.2500 g | Freq: Three times a day (TID) | INTRAVENOUS | Status: DC
  Filled 2020-08-23 (×2): qty 50

## 2020-08-23 MED ORDER — VANCOMYCIN HCL 500 MG/100ML IV SOLN
500.0000 mg | INTRAVENOUS | Status: DC
  Filled 2020-08-23 (×2): qty 100

## 2020-08-23 MED ORDER — PIPERACILLIN-TAZOBACTAM IN DEX 2-0.25 GM/50ML IV SOLN
2.2500 g | Freq: Three times a day (TID) | INTRAVENOUS | Status: DC
  Administered 2020-08-23 – 2020-08-27 (×11): 2.25 g via INTRAVENOUS
  Filled 2020-08-23 (×13): qty 50

## 2020-08-23 MED ORDER — SODIUM BICARBONATE 8.4 % IV SOLN: 100.0000 meq | Freq: Once | INTRAVENOUS | Status: AC

## 2020-08-23 MED ORDER — METHYLPREDNISOLONE SODIUM SUCC 125 MG IJ SOLR: 125.0000 mg | INTRAMUSCULAR | Status: AC

## 2020-08-23 MED ORDER — POLYETHYLENE GLYCOL 3350 17 G PO PACK: 17.0000 g | PACK | Freq: Every day | ORAL | Status: DC

## 2020-08-23 MED ORDER — PHENYLEPHRINE 40 MCG/ML (10ML) SYRINGE FOR IV PUSH (FOR BLOOD PRESSURE SUPPORT)
PREFILLED_SYRINGE | INTRAVENOUS | Status: AC
  Filled 2020-08-23: qty 40

## 2020-08-23 MED ORDER — PROPOFOL 500 MG/50ML IV EMUL
INTRAVENOUS | Status: DC | PRN
  Administered 2020-08-23: 50 ug/kg/min via INTRAVENOUS

## 2020-08-23 MED ORDER — FAMOTIDINE IN NACL 20-0.9 MG/50ML-% IV SOLN: 20.0000 mg | Freq: Once | INTRAVENOUS | Status: DC

## 2020-08-23 MED ORDER — CEFAZOLIN SODIUM-DEXTROSE 1-4 GM/50ML-% IV SOLN
INTRAVENOUS | Status: DC | PRN
  Administered 2020-08-23: 1 g via INTRAVENOUS

## 2020-08-23 MED ORDER — ETOMIDATE 2 MG/ML IV SOLN
INTRAVENOUS | Status: AC
  Administered 2020-08-23: 20 mg via INTRAVENOUS
  Filled 2020-08-23: qty 20

## 2020-08-23 MED ORDER — ETOMIDATE 2 MG/ML IV SOLN: 20.0000 mg | Freq: Once | INTRAVENOUS | Status: AC

## 2020-08-23 MED ORDER — ROCURONIUM BROMIDE 10 MG/ML (PF) SYRINGE
PREFILLED_SYRINGE | INTRAVENOUS | Status: DC | PRN
  Administered 2020-08-23: 50 mg via INTRAVENOUS

## 2020-08-23 MED ORDER — FENTANYL 2500MCG IN NS 250ML (10MCG/ML) PREMIX INFUSION
0.0000 ug/h | INTRAVENOUS | Status: DC
  Administered 2020-08-23: 100 ug/h via INTRAVENOUS
  Administered 2020-08-24 (×2): 400 ug/h via INTRAVENOUS
  Administered 2020-08-24: 300 ug/h via INTRAVENOUS
  Administered 2020-08-25: 400 ug/h via INTRAVENOUS
  Administered 2020-08-25: 350 ug/h via INTRAVENOUS
  Administered 2020-08-25: 250 ug/h via INTRAVENOUS
  Administered 2020-08-26: 150 ug/h via INTRAVENOUS
  Administered 2020-08-26: 200 ug/h via INTRAVENOUS
  Filled 2020-08-23 (×9): qty 250

## 2020-08-23 MED ORDER — VANCOMYCIN HCL 500 MG/100ML IV SOLN
500.0000 mg | INTRAVENOUS | Status: DC
  Filled 2020-08-23: qty 100

## 2020-08-23 MED ORDER — ORAL CARE MOUTH RINSE
15.0000 mL | OROMUCOSAL | Status: DC
  Administered 2020-08-23 – 2020-08-29 (×51): 15 mL via OROMUCOSAL

## 2020-08-23 MED ORDER — DOCUSATE SODIUM 50 MG/5ML PO LIQD: 100.0000 mg | Freq: Two times a day (BID) | ORAL | Status: DC

## 2020-08-23 MED ORDER — SODIUM CHLORIDE 0.9 % IV BOLUS: 250.0000 mL | Freq: Once | INTRAVENOUS | Status: DC

## 2020-08-23 MED ORDER — SODIUM CHLORIDE 0.9% IV SOLUTION: Freq: Once | INTRAVENOUS | Status: DC

## 2020-08-23 MED ORDER — FENTANYL CITRATE (PF) 100 MCG/2ML IJ SOLN: 50.0000 ug | Freq: Once | INTRAMUSCULAR | Status: AC

## 2020-08-23 MED ORDER — CALCIUM CHLORIDE 10 % IV SOLN
INTRAVENOUS | Status: DC | PRN
  Administered 2020-08-23: 400 mg via INTRAVENOUS
  Administered 2020-08-23 (×2): 300 mg via INTRAVENOUS

## 2020-08-23 MED ORDER — LACTATED RINGERS IV BOLUS
1000.0000 mL | Freq: Once | INTRAVENOUS | Status: AC
  Administered 2020-08-23: 1000 mL via INTRAVENOUS

## 2020-08-23 MED ORDER — 0.9 % SODIUM CHLORIDE (POUR BTL) OPTIME
TOPICAL | Status: DC | PRN
  Administered 2020-08-23 (×2): 1000 mL

## 2020-08-23 MED ORDER — MIDAZOLAM HCL 2 MG/2ML IJ SOLN: 2.0000 mg | Freq: Once | INTRAMUSCULAR | Status: AC

## 2020-08-23 SURGICAL SUPPLY — 40 items
APL PRP STRL LF DISP 70% ISPRP (MISCELLANEOUS) ×1
BLADE CLIPPER SURG (BLADE) IMPLANT
CANISTER SUCT 3000ML PPV (MISCELLANEOUS) IMPLANT
CANISTER WOUNDNEG PRESSURE 500 (CANNISTER) ×2 IMPLANT
CHLORAPREP W/TINT 26 (MISCELLANEOUS) ×2 IMPLANT
COVER SURGICAL LIGHT HANDLE (MISCELLANEOUS) ×2 IMPLANT
COVER WAND RF STERILE (DRAPES) IMPLANT
DRAPE LAPAROSCOPIC ABDOMINAL (DRAPES) ×2 IMPLANT
DRAPE WARM FLUID 44X44 (DRAPES) ×2 IMPLANT
ELECT BLADE 6.5 EXT (BLADE) ×2 IMPLANT
ELECT CAUTERY BLADE 6.4 (BLADE) ×2 IMPLANT
ELECT REM PT RETURN 9FT ADLT (ELECTROSURGICAL) ×2
ELECTRODE REM PT RTRN 9FT ADLT (ELECTROSURGICAL) ×1 IMPLANT
GOWN STRL REUS W/ TWL LRG LVL3 (GOWN DISPOSABLE) ×1 IMPLANT
GOWN STRL REUS W/TWL 2XL LVL3 (GOWN DISPOSABLE) ×2 IMPLANT
GOWN STRL REUS W/TWL LRG LVL3 (GOWN DISPOSABLE) ×2
HANDLE SUCTION POOLE (INSTRUMENTS) ×1 IMPLANT
HEMOSTAT SNOW SURGICEL 2X4 (HEMOSTASIS) ×2 IMPLANT
KIT BASIN OR (CUSTOM PROCEDURE TRAY) ×2 IMPLANT
KIT TURNOVER KIT B (KITS) ×2 IMPLANT
LIGASURE IMPACT 36 18CM CVD LR (INSTRUMENTS) ×2 IMPLANT
NS IRRIG 1000ML POUR BTL (IV SOLUTION) ×4 IMPLANT
PACK GENERAL/GYN (CUSTOM PROCEDURE TRAY) ×2 IMPLANT
PAD ARMBOARD 7.5X6 YLW CONV (MISCELLANEOUS) ×2 IMPLANT
PENCIL SMOKE EVACUATOR (MISCELLANEOUS) ×2 IMPLANT
SPONGE ABD ABTHERA ADVANCE (MISCELLANEOUS) ×2 IMPLANT
SPONGE LAP 18X18 RF (DISPOSABLE) ×10 IMPLANT
STAPLER VISISTAT 35W (STAPLE) ×2 IMPLANT
SUCTION POOLE HANDLE (INSTRUMENTS) ×2
SUT PDS AB 1 TP1 96 (SUTURE) IMPLANT
SUT SILK 2 0 SH CR/8 (SUTURE) ×2 IMPLANT
SUT SILK 2 0 TIES 10X30 (SUTURE) ×2 IMPLANT
SUT SILK 2 0SH CR/8 30 (SUTURE) ×4 IMPLANT
SUT SILK 3 0 SH CR/8 (SUTURE) ×2 IMPLANT
SUT SILK 3 0 TIES 10X30 (SUTURE) ×2 IMPLANT
SUT SILK 3 0SH CR/8 30 (SUTURE) ×2 IMPLANT
SUT VIC AB 3-0 SH 18 (SUTURE) IMPLANT
TOWEL GREEN STERILE (TOWEL DISPOSABLE) ×2 IMPLANT
TRAY FOLEY MTR SLVR 16FR STAT (SET/KITS/TRAYS/PACK) IMPLANT
YANKAUER SUCT BULB TIP NO VENT (SUCTIONS) IMPLANT

## 2020-08-23 NOTE — Op Note (Signed)
NAME: BLAISE, GRIESHABER MEDICAL RECORD NO: [id number] ACCOUNT NO: [id number] DATE OF BIRTH: December 28, 1955 FACILITY: [facility] LOCATION: [facility] PHYSICIAN: Leighton Ruff. Redmond Pulling, MD  Operative Report   DATE OF PROCEDURE: 08/23/2020  PREOPERATIVE DIAGNOSIS:  Splenic rupture, hemoperitoneum, hemorrhagic shock.  POSTOPERATIVE DIAGNOSIS:  Splenic rupture, hemoperitoneum, hemorrhagic shock.  PROCEDURES:   1.  Exploratory laparotomy. 2.  Splenectomy. 3.  Application of ABThera wound VAC.  SURGEON:  Leighton Ruff. Redmond Pulling, MD  ASSISTANT SURGEON:  Georganna Skeans, M.D.  Pensions consultant was needed due to the acute severe critical illness of the patient, prior laparotomy and the anticipated difficulty of the case.  ANESTHESIA:  General.  ESTIMATED BLOOD LOSS:  2.5 liters  BLOOD ADMINISTERED:  6 units of PRBCs, 6 units of FFP, 1 pack of platelets.  SPECIMENS:  Spleen and pieces.  FINDINGS:  The patient had his spleen essentially had ruptured, it was in pieces.  The capsule had been what appeared to be completely avulsed.  There was a large amount of hemoperitoneum.  Two lap pads were placed in the left upper quadrant. Large amount of contusion to distal transverse colon wall and its mesentery.  INDICATIONS FOR PROCEDURE:  The patient is a gentleman who was admitted a few days ago with malaise and chest discomfort.  He was found to have anemia and requiring transfusion due to ongoing anemia.  He underwent colonoscopy on 03/28 where  diverticulitis and polyps were noted, but no obvious bleeding identified.  He this morning, complained of acute abdominal pain and trouble breathing.  He then developed shock with tachycardia and hypotension and hypothermia and tachypnea.  He was started  on vasopressors and transferred emergently to the ICU.  He was found to be acutely anemic and blood transfusion was ordered.  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  He underwent a stat CT, which  demonstrated splenic  rupture with significant hemoperitoneum.  We were emergently consulted.  CCM had intubated the patient, they have placed a femoral arterial line, fluids and he was on several pressors including epinephrine and vasopressor.  Massive transfusion protocol was  initiated by me and he was given medications for anticipated potential blood reaction given his antibodies, but we felt that given his significant hemoperitoneum and hemorrhagic shock that he needed emergent blood transfusion despite his antibody issues.   He was given Solu-Medrol and Benadryl and Tylenol and Pepcid.  I contacted the granddaughter and who he had indicated was his power of attorney and discussed the situation with her.  I informed her that he needed an emergency laparotomy and  splenectomy.  She consented.  We discussed the risks and benefits including but not limited to bleeding, infection, need for additional procedures, injury to surrounding structures, blood clot formation, perioperative cardiac and pulmonary events, death,  hernia formation, potential for susceptibility to infections long-term and the need for post-splenectomy vaccines should he survive this hospitalization.  She gave consent.  DESCRIPTION OF PROCEDURE:  The patient was taken emergently to OR 1 at [facility].  He was placed supine on the operating room table.  Endotracheal tube was connected to the anesthesia circuit.  He had a dual lumen PICC line in his left neck.   He had a prior central line attempt via the right neck, but they could not thread the catheter, the other day, I emergently placed a left femoral central venous catheter and the [place] rapid infuser was connected and started giving ongoing blood  products.  At  this point, his abdomen was prepped and draped in the usual standard surgical fashion with ChloraPrep.  He received Ancef.  Surgical timeout was performed.  Upper midline incision was made with the scalpel from the  xiphoid all the way down  to slightly below the umbilicus.  Fascia was incised.  The abdominal cavity was entered.  There was a large amount of hemoperitoneum in the abdomen.   Lap pads were placed in the left upper quadrant.  We continued to  open the midline incision.  There were some adhesions around the umbilicus, which were taken down with [place].  I felt in the left upper quadrant and the spleen was in pieces.  It was shattered.  Old clot was evacuated as  well.  There was still a portion of the spleen connected to the hilum.  It was serially clamped with 2 Kellys and then [place] were used to cut the specimen side.  We placed multiple 2-0 silk ligatures around these Kellys for hemostasis.  A small serosal  tear had been made in the greater curvature of the stomach and this was repaired with 3 interrupted 2-0 silks by Dr. Grandville Silos.  This was not an unexpected occurrence given the emergent nature of the procedure.  There was some additional bleeding from  what appeared to be the splenic artery.  This was managed with 2 additional figure-of-eight 2-0 silk ligatures.  At this point, there was just some general ooze in the left upper quadrant.  The splenic capsule was still attached in pieces to portion of  the diaphragm.  This was a little bit debrided with LigaSure device as well as with Bovie electrocautery, but there appeared to be no significant spleen left in the abdomen.  The left upper quadrant was irrigated.  There appeared to be some contusion  along the distal transverse colon in its wall and its mesentery.  The patient had received 6 units of PRBCs, 6 units of FFP and 1 pack of platelets and calcium.  We had ordered cryoprecipitate.  His GI bleed, which was his initial event had not been localized so  therefore, I did not close his abdomen.  As I was concerned that he might have ongoing gastrointestinal bleeding and the main goal today was to take care of his splenic rupture.  Therefore, I decided  to leave his abdomen open.  The patient was still  acidotic, so another reason not to close his abdomen.  An ABThera wound VAC device was placed in the appropriate fashion with appropriate seal.  We did leave two laparotomy pads in the left upper quadrant underneath after placing some SNoW in the left  upper quadrant.  There was no frank pulsatile bleeding at that time.  There was just a little bit of ooze from the diaphragm.  All needle, instrument and sponge counts were correct x2 with the exception of the two lap pads that were left in the left  upper quadrant for packing purposes.  He was taken back to the Intensive Care Unit, intubated in critical condition.   PUS D: 08/23/2020 5:31:35 pm T: 08/23/2020 6:50:00 pm  JOB: [id number]/ [id number]

## 2020-08-23 NOTE — Consult Note (Addendum)
[facility] Surgery Consult Note  Logan Martin 1956/03/27  [id number].    Requesting MD: Waldron Labs, MD  Chief Complaint/Reason for Consult: acute abdominal pain 24 hours after colonoscopy, GIB.  HPI:  Logan Martin is a 65 y/o M with MMP including ESRD on HD, HTN, HLD,CAD, PVD, Hx adenocarcinoma of the bladder,anemia, thrombocytopenia, hx of PE, hepatitis C who presented to the ED 08/19/20 with a cc malaise and chest discomfort. Workup significant for sinus tachycardia (135 bpm), hgb 7.9, platelets 124. Was admitted for further workup of tachycardia and hgb continued to drift downward necessitating transfusion. He was reported to have blood per rectum - red and black.  GI was consulted with suspicion for diverticular bleed and VT angio was ordered (negative). He underwent colonoscopy 3/28 where diverticulosis and polyps were noted but no bleeding identified. He had flash pulmonary edema during the procedure requiring intubation. Esophagus was intubated initially prior to successful endotracheal intubation. He was extubated post-procedure. This morning the patient expressed acute abdominal pain and diaphoresis along with shock (tachycardia, hypotension, hypothermia, tachypnea) requiring pressor support. CCS has been asked to evaluate due to concern for possible intra-abdominal perforation.  ROS: Review of Systems  Constitutional: Positive for diaphoresis and malaise/fatigue. Negative for chills and fever.  HENT: Positive for hearing loss.   Eyes: Negative.   Respiratory: Negative.   Cardiovascular: Negative.   Gastrointestinal: Positive for abdominal pain, blood in stool and nausea.  Genitourinary: Negative.   Musculoskeletal: Negative.   Neurological: Negative.   Endo/Heme/Allergies: Negative.   Psychiatric/Behavioral: Negative.     Family History  Problem Relation Age of Onset  . Cancer Mother   . Diabetes Mother   . Hypertension Mother   . Deep vein thrombosis Daughter      Past Medical History:  Diagnosis Date  . Alcohol abuse    quit date in 1995  . Anemia   . Cancer ([place])    bladder  . Chronic low back pain   . Cocaine abuse in remission ([place])    Quit date in 1995  . ESRD (end stage renal disease) ([place])    Home Dialysis  . 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 Started HD in [place], [place].  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  On NxStage home hemo since 2010. Using RUA AVF.    [name] Hearing aid worn    B/L  . Hepatitis C    s/p treatment with Mavyret  . History of blood transfusion 2007 X 1  . History of kidney stones   . Hx of constipation   . Hypertension   . Left inguinal hernia   . Peripheral vascular disease ([place])   . Pulmonary embolism [facility]) Feb 2012   Treated with coumadin x 1 year  . Wears glasses     Past Surgical History:  Procedure Laterality Date  . A/V FISTULAGRAM N/A 11/22/2017   Procedure: A/V Fistulagram;  Surgeon: Serafina Mitchell, MD;  Location: [place] [facility];  Service: Cardiovascular;  Laterality: N/A;  rt. arm   . ABDOMINAL HERNIA REPAIR  2000    x 2  . AV FISTULA PLACEMENT Right 2007   upper arm  . AV FISTULA REPAIR Right ~ 03/2016 & 04/2016  . BLADDER AUGMENTATION  1975  . COLONOSCOPY    . COLONOSCOPY WITH PROPOFOL N/A 08/22/2020   Procedure: COLONOSCOPY WITH PROPOFOL;  Surgeon: Jackquline Denmark, MD;  Location: [facility] ENDOSCOPY;  Service: Endoscopy;  Laterality: N/A;  .  CORONARY ANGIOPLASTY WITH STENT PLACEMENT  1999  . CYSTOSCOPY W/ URETERAL STENT PLACEMENT N/A 10/13/2017   Procedure: CYSTOSCOPY WITH IRRIGATION OF BLADDER PLACEMENT OF FOLEY;  Surgeon: Ceasar Mons, MD;  Location: [place];  Service: Urology;  Laterality: N/A;  . CYSTOSCOPY WITH FULGERATION N/A 03/27/2018   Procedure: [place] AND MUCOUS EVACUATION, BLADDER BIOPSY;  Surgeon: Franchot Gallo, MD;  Location: [facility];  Service: Urology;  Laterality: N/A;  .  ESOPHAGOGASTRODUODENOSCOPY    . EXTRACORPOREAL SHOCK WAVE LITHOTRIPSY    . FISTULA SUPERFICIALIZATION Right 01/31/6212   Procedure: PLICATION OF ANEURYSM OF ARTERIOVENOUS FISTULA;  Surgeon: Angelia Mould, MD;  Location: [place];  Service: Vascular;  Laterality: Right;  . HERNIA REPAIR    . INGUINAL HERNIA REPAIR Right 1975  . INGUINAL HERNIA REPAIR Left 03/04/2019   Procedure: LEFT OPEN INGUINAL HERNIA REPAIR WITH MESH;  Surgeon: Kinsinger, Arta Bruce, MD;  Location: [place];  Service: General;  Laterality: Left;  GENERAL AND LMA  . INSERTION OF DIALYSIS CATHETER Left 03/15/2020   Procedure: INSERTION OF Left Internal Jugular DIALYSIS CATHETER;  Surgeon: [name], MD;  Location: [place];  Service: Vascular;  Laterality: Left;  . IR FLUORO GUIDE CV LINE LEFT  03/11/2020  . IR FLUORO GUIDE CV LINE LEFT  08/20/2020  . IR US GUIDE VASC ACCESS LEFT  03/11/2020  . IR US GUIDE VASC ACCESS LEFT  08/20/2020  . IR US GUIDE VASC ACCESS RIGHT  08/20/2020  . IR VENO/JUGULAR RIGHT  08/20/2020  . LEFT HEART CATH AND CORONARY ANGIOGRAPHY N/A 05/06/2020   Procedure: LEFT HEART CATH AND CORONARY ANGIOGRAPHY;  Surgeon: Nigel Mormon, MD;  Location: [place] [facility];  Service: Cardiovascular;  Laterality: N/A;  . PERIPHERAL VASCULAR BALLOON ANGIOPLASTY  11/22/2017   Procedure: PERIPHERAL VASCULAR BALLOON ANGIOPLASTY;  Surgeon: Serafina Mitchell, MD;  Location: [place] [facility];  Service: Cardiovascular;;  rt. arm fistula  . PERIPHERAL VASCULAR CATHETERIZATION N/A 06/22/2016   Procedure: Left Arm Venography;  Surgeon: Elam Dutch, MD;  Location: [place] [facility];  Service: Cardiovascular;  Laterality: N/A;  . PERIPHERAL VASCULAR CATHETERIZATION N/A 06/22/2016   Procedure: A/V Fistulagram - Right Arm;  Surgeon: Elam Dutch, MD;  Location: [place] [facility];  Service: Cardiovascular;  Laterality: N/A;  . PERIPHERAL VASCULAR CATHETERIZATION Right 06/22/2016   Procedure: Peripheral Vascular  Balloon Angioplasty;  Surgeon: Elam Dutch, MD;  Location: [place] [facility];  Service: Cardiovascular;  Laterality: Right;  arm fistula  . POLYPECTOMY  08/22/2020   Procedure: POLYPECTOMY;  Surgeon: Jackquline Denmark, MD;  Location[facility] ENDOSCOPY;  Service: Endoscopy;;  . REVISON OF ARTERIOVENOUS FISTULA Right 03/15/2020   Procedure: RIGHT ARM ARTERIOVENOUS FISTULA REVISON;  Surgeon: [name], MD;  Location: [place];  Service: Vascular;  Laterality: Right;    Social History:  reports that he quit smoking about 27 years ago. His smoking use included cigarettes. He has a 5.00 pack-year smoking history. He has never used smokeless tobacco. He reports previous alcohol use. He reports previous drug use. Drugs: Marijuana and Cocaine.  Allergies:  Allergies  Allergen Reactions  . Heparin Rash    Pork products   . Pork-Derived Products Rash  . Sulfa Antibiotics Rash    Medications Prior to Admission  Medication Sig Dispense Refill  . amLODipine (NORVASC) 10 MG tablet Take 1 tablet (10 mg total) by mouth daily. 30 tablet 0  . aspirin EC 81 MG tablet Take 81  mg by mouth at bedtime.     [name] atorvastatin (LIPITOR) 40 MG tablet Take 40 mg by mouth daily.    Lorin Picket 1 GM 210 MG(Fe) tablet Take 420 mg by mouth 3 (three) times daily.    . hydrALAZINE (APRESOLINE) 25 MG tablet Take 1 tablet (25 mg total) by mouth 3 (three) times daily. 90 tablet 0  . hydrOXYzine (VISTARIL) 50 MG capsule Take 50 mg by mouth daily at 6 (six) AM.    . isosorbide mononitrate (IMDUR) 30 MG 24 hr tablet Take 30 mg by mouth daily.     [name] labetalol (NORMODYNE) 100 MG tablet Take 100 mg by mouth daily.    [name] lidocaine-prilocaine (EMLA) cream Apply 1 application topically See admin instructions. Apply small amount to dialysis port (AVF) one hour before dialysis. Cover with occlusive dressing (saran wrap)  6  . LOKELMA 5 g packet Take 1 packet by mouth daily.    . methocarbamol (ROBAXIN) 750 MG tablet Take 750 mg by mouth every  8 (eight) hours as needed for muscle spasms.    [name] oxyCODONE (OXY IR/ROXICODONE) 5 MG immediate release tablet Take 5 mg by mouth 2 (two) times daily as needed for moderate pain.    [name] sucralfate (CARAFATE) 1 g tablet Take 1 g by mouth daily.    . traZODone (DESYREL) 150 MG tablet Take 150 mg by mouth daily as needed.    . Vitamin D, Ergocalciferol, (DRISDOL) 1.25 MG (50000 UNIT) CAPS capsule Take 50,000 Units by mouth once a week.      Blood pressure 106/77, pulse 89, temperature (!) 96.9 F (36.1 C), temperature source Rectal, resp. rate (!) 36, height 5\' 4"  (1.626 m), weight 49.4 kg, SpO2 100 %. Physical Exam: Constitutional: chronically ill appearing male in acute distress Eyes: Moist conjunctiva; no lid lag; anicteric; PERRL Neck: Trachea midline; no thyromegaly Lungs: slightly labored on NRB, CTAB CV: tachycardia; no palpable thrills; no pitting edema GI: globally tender on Abd exam, decreased breath sounds, no palpable hepatosplenomegaly MSK: symmetrical no clubbing/cyanosis Psychiatric: Appropriate affect; alert and oriented x3 Lymphatic: No palpable cervical or axillary lymphadenopathy  Results for orders placed or performed during the hospital encounter of 08/19/20 (from the past 48 hour(s))  Hemoglobin and hematocrit, blood     Status: Abnormal   Collection Time: 08/21/20  6:19 PM  Result Value Ref Range   Hemoglobin 9.5 (L) 13.0 - 17.0 g/dL    Comment: REPEATED TO VERIFY POST TRANSFUSION SPECIMEN    HCT 27.9 (L) 39.0 - 52.0 %    Comment: Performed at [facility], [place] [address] [place], [place] [address]  CBC     Status: Abnormal   Collection Time: 08/22/20  5:00 AM  Result Value Ref Range   WBC 3.8 (L) 4.0 - 10.5 K/uL   RBC 2.81 (L) 4.22 - 5.81 MIL/uL   Hemoglobin 9.0 (L) 13.0 - 17.0 g/dL   HCT 26.1 (L) 39.0 - 52.0 %   MCV 92.9 80.0 - 100.0 fL   MCH 32.0 26.0 - 34.0 pg   MCHC 34.5 30.0 - 36.0 g/dL   RDW 17.9 (H) 11.5 - 15.5 %   Platelets 123 (L) 150 - 400  K/uL   nRBC 0.0 0.0 - 0.2 %    Comment: Performed at [facility] [address] [place], [place] [address]  Basic metabolic panel     Status: Abnormal   Collection Time: 08/22/20  5:00 AM  Result Value Ref  Range   Sodium 131 (L) 135 - 145 mmol/L   Potassium 4.1 3.5 - 5.1 mmol/L   Chloride 94 (L) 98 - 111 mmol/L   CO2 26 22 - 32 mmol/L   Glucose, Bld 78 70 - 99 mg/dL    Comment: Glucose reference range applies only to samples taken after fasting for at least 8 hours.   BUN 38 (H) 8 - 23 mg/dL   Creatinine, Ser 7.98 (H) 0.61 - 1.24 mg/dL   Calcium 9.3 8.9 - 10.3 mg/dL   GFR, Estimated 7 (L) >60 mL/min    Comment: (NOTE) Calculated using the CKD-EPI Creatinine Equation (2021)    Anion gap 11 5 - 15    Comment: Performed at [facility] [address] [place], [place] [address]  Surgical pathology     Status: None   Collection Time: 08/22/20 10:27 AM  Result Value Ref Range   SURGICAL PATHOLOGY      SURGICAL PATHOLOGY CASE: [id number] PATIENT: [name] Surgical Pathology Report     Clinical History: Hematochezia (cm)     FINAL MICROSCOPIC DIAGNOSIS:  A. COLON, PROXIMAL TRANSVERSE, POLYPECTOMY: - Tubular adenoma. - Negative for high grade dysplasia.  B. COLON, DESCENDING, POLYPECTOMY: - Tubular adenoma. - Negative for high grade dysplasia.   GROSS DESCRIPTION:  A: Received in formalin is a tan, soft tissue fragment that is submitted in toto. Size: 0.3 cm, 1 block submitted.  B: Received in formalin is a tan, soft tissue fragment that is submitted in toto. Size: 0.6 cm, 1 block submitted.  [facility] 08/14/2020)    Final Diagnosis performed by Gillie Manners, MD.   Electronically signed 08/23/2020 Technical and / or Professional components performed at [facility]. [facility], [place] [address], [place], [place] [address].  Immunohistochemistry Technical component (if applicable) was performed at [facility]. [address], [place], [place], [place] [address].   IMMUNOHISTOCHEMISTRY DISCLAIMER (if applicable): Some of these immunohistochemical stains may have been developed and the performance characteristics determine by [facility]. Some may not have been cleared or approved by the U.S. Food and Drug Administration. The FDA has determined that such clearance or approval is not necessary. This test is used for clinical purposes. It should not be regarded as investigational or for research. This laboratory is certified under the [place] (CLIA-88) as qualified to perform high complexity clinical laboratory testing.  The controls stained appropriately.   Troponin I (High Sensitivity)     Status: Abnormal   Collection Time: 08/22/20  1:58 PM  Result Value Ref Range   Troponin I (High Sensitivity) 39 (H) <18 ng/L    Comment: (NOTE) Elevated high sensitivity troponin I (hsTnI) values and significant  changes across serial measurements may suggest ACS but many other  chronic and acute conditions are known to elevate hsTnI results.  Refer to the "Links" section for chest pain algorithms and additional  guidance. Performed at [facility], [place] [address], [place], [place] [address]   Troponin I (High Sensitivity)     Status: Abnormal   Collection Time: 08/22/20  3:40 PM  Result Value Ref Range   Troponin I (High Sensitivity) 38 (H) <18 ng/L    Comment: (NOTE) Elevated high sensitivity troponin I (hsTnI) values and significant  changes across serial measurements may suggest ACS but many other  chronic and acute conditions are known to elevate hsTnI results.  Refer to the "Links" section for  chest pain algorithms and additional  guidance. Performed at [facility], [place] [address], [place], [place] [address]   CBC     Status: Abnormal   Collection Time: 08/23/20  3:00 AM  Result Value Ref Range   WBC 6.3 4.0 - 10.5 K/uL   RBC  2.14 (L) 4.22 - 5.81 MIL/uL   Hemoglobin 6.9 (LL) 13.0 - 17.0 g/dL    Comment: REPEATED TO VERIFY THIS CRITICAL RESULT HAS VERIFIED AND BEEN CALLED TO L.SANCHEZ,RN BY MELISSA BROGDON ON 03 29 2022 AT 0337, AND HAS BEEN READ BACK.     HCT 20.2 (L) 39.0 - 52.0 %   MCV 94.4 80.0 - 100.0 fL   MCH 32.2 26.0 - 34.0 pg   MCHC 34.2 30.0 - 36.0 g/dL   RDW 17.2 (H) 11.5 - 15.5 %   Platelets 149 (L) 150 - 400 K/uL   nRBC 0.0 0.0 - 0.2 %    Comment: Performed at [facility] [address], [place], [place] [address]  Basic metabolic panel     Status: Abnormal   Collection Time: 08/23/20  3:00 AM  Result Value Ref Range   Sodium 130 (L) 135 - 145 mmol/L   Potassium 4.4 3.5 - 5.1 mmol/L   Chloride 95 (L) 98 - 111 mmol/L   CO2 21 (L) 22 - 32 mmol/L   Glucose, Bld 88 70 - 99 mg/dL    Comment: Glucose reference range applies only to samples taken after fasting for at least 8 hours.   BUN 47 (H) 8 - 23 mg/dL   Creatinine, Ser 9.81 (H) 0.61 - 1.24 mg/dL   Calcium 9.2 8.9 - 10.3 mg/dL   GFR, Estimated 5 (L) >60 mL/min    Comment: (NOTE) Calculated using the CKD-EPI Creatinine Equation (2021)    Anion gap 14 5 - 15    Comment: Performed at [facility] [address], [place], [place] [address]  Prepare RBC (crossmatch)     Status: None   Collection Time: 08/23/20  3:49 AM  Result Value Ref Range   Order Confirmation      ORDER PROCESSED BY BLOOD BANK Performed at [facility], [place] [address] [place], [place] [address]   CBC     Status: Abnormal   Collection Time: 08/23/20 10:20 AM  Result Value Ref Range   WBC 5.8 4.0 - 10.5 K/uL   RBC 2.52 (L) 4.22 - 5.81 MIL/uL   Hemoglobin 8.0 (L) 13.0 - 17.0 g/dL   HCT 22.9 (L) 39.0 - 52.0 %   MCV 90.9 80.0 - 100.0 fL   MCH 31.7 26.0 - 34.0 pg   MCHC 34.9 30.0 - 36.0 g/dL   RDW 16.7 (H) 11.5 - 15.5 %   Platelets 130 (L) 150 - 400 K/uL    Comment: REPEATED TO VERIFY   nRBC 0.0 0.0 - 0.2 %    Comment: Performed at [facility], [place] [address], [place], [place] [address]   DG Chest Port 1 View  Result Date: 08/23/2020 CLINICAL DATA:  Shortness of breath. EXAM: PORTABLE CHEST 1 VIEW COMPARISON:  Single-view of the chest 08/22/2020. FINDINGS: Left IJ central venous catheter and vascular stent along the right subclavian vessels are unchanged. Lungs are clear. No pneumothorax or pleural effusion. Heart size is normal. Aortic atherosclerosis noted. IMPRESSION: No acute disease. Aortic Atherosclerosis (ICD10-I70.0). Electronically Signed   By: Inge Rise M.D.   On: 08/23/2020 14:26  DG Chest Port 1 View  Result Date: 08/22/2020 CLINICAL DATA:  Chest EXAM: PORTABLE CHEST 1 VIEW COMPARISON:  Earlier same day FINDINGS: No new consolidation or edema. No pleural effusion. No pneumothorax. Stable cardiomediastinal contours with cardiomegaly. Right vascular stent. Left IJ central line tip overlies cavoatrial junction. IMPRESSION: No acute process in the chest. Electronically Signed   By: Macy Mis M.D.   On: 08/22/2020 15:07   DG CHEST PORT 1 VIEW  Result Date: 08/22/2020 CLINICAL DATA:  Hypoxia EXAM: PORTABLE CHEST 1 VIEW COMPARISON:  August 19, 2020 FINDINGS: Endotracheal tube tip is 3.4 cm above the carina. Central catheter tip is at the cavoatrial junction. No pneumothorax. The lungs are clear. Heart is mildly enlarged with pulmonary vascularity normal. No adenopathy. There is a right innominate/subclavian region stent, unchanged in position. IMPRESSION: Tube and catheter positions as described without pneumothorax. No edema or airspace opacity. Stable cardiac prominence. Electronically Signed   By: Lowella Grip III M.D.   On: 08/22/2020 11:03   DG Abd Portable 1V  Result Date: 08/23/2020 CLINICAL DATA:  Abdominal pain. EXAM: PORTABLE ABDOMEN - 1 VIEW COMPARISON:  March 10, 2012. FINDINGS: The bowel gas pattern is normal. Residual contrast is seen throughout the small bowel. Surgical staples are noted in  the pelvis. No radio-opaque calculi or other significant radiographic abnormality are seen. IMPRESSION: No evidence of bowel obstruction or ileus. Electronically Signed   By: Marijo Conception M.D.   On: 08/23/2020 14:26   DG ESOPHAGUS W SINGLE CM (SOL OR THIN BA)  Result Date: 08/22/2020 CLINICAL DATA:  Chest pain following recent colonoscopy with esophageal intubation. Evaluate for esophageal leak. Water-soluble study requested. EXAM: ESOPHOGRAM/BARIUM SWALLOW TECHNIQUE: Single contrast examination was performed using Omnipaque 300 by mouth. FLUOROSCOPY TIME:  Fluoroscopy Time: 1 minutes and 18 seconds of low-dose pulsed fluoroscopy Radiation Exposure Index (if provided by the fluoroscopic device): 15.4 mGy Number of Acquired Spot Images: 1 COMPARISON:  Chest radiographs same date.  Chest CT 06/17/2018. FINDINGS: The patient has somewhat limited mobility. These emanation was performed in the AP semi erect and RPO supine positions. The patient swallowed the contrast without difficulty. There is mild esophageal dysmotility. No evidence of esophageal leak, mass or ulceration. There is no laryngeal penetration or aspiration. Right subclavian/brachiocephalic vascular stent and left IJ central venous catheter are noted. IMPRESSION: No evidence of esophageal leak or other injury. Mild esophageal dysmotility. Electronically Signed   By: Richardean Sale M.D.   On: 08/22/2020 16:12   Assessment/Plan ESRD on HD HTN HLD CAD PVD 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 Lovena Neighbours), 08/11/2018 Cystoprostatectomy + resection bladder augmentation + enterolysis ([facility], Dr. Zenia Resides) [place] with mesh (02/2019 Dr. Kieth Brightly)  thrombocytopenia PMH PE Hepatitis C   Acute on chronic Anemia GI Bleed requiring transfusion - CTA abdomen pelvis 3/28 with no evidence of GIB - s/p colonoscopy 3/28 Dr. Lyndel Safe; two colon polyps removed, diverticulosis noted, no bleeding; required intubated for procedure due to flash  pulm edema - initial intubation attempt was in the esophagus. Esophogram negative for esophageal injury/leak. - per pt and hospitalist pt developed acute abd pain today, diaphoresis, tachycardia, hypotension. Started on levo with improvement in pressures. Global tenderness on abd exam. KUB/CXR without evidence of pneumoperitoneum, ileus, or obstruction. Going for STAT CT chest/abdomen/pelvis.   Addendum (1535): CT shows acute splenic rupture (not present on scan 3 days prior), patient with hgb of 4.6 now, lactate 6.3, MTP activated, on 2 pressors, was just intubated. Recommend  proceeding emergently to the operating room for exploratory laparotomy, splenectomy by Dr. Greer Pickerel.  Emergency consent was obtained from the patients granddaughter, Amelia Jo. His daughter Erline Hau in on the way to the hospital.  Jill Alexanders, [facility] Surgery Please see Amion for pager number during day hours 7:00am-4:30pm 08/23/2020, 2:29 PM

## 2020-08-23 NOTE — Procedures (Signed)
Arterial Catheter Insertion Procedure Note  AARRON WIERZBICKI  [id number]  04/18/1956  Date:08/23/20  Time:4:06 PM    Provider Performing: Kennieth Rad    Procedure: Insertion of Arterial Line [phone]) with US guidance ([id number])   Indication(s) Blood pressure monitoring and/or need for frequent ABGs  Consent Unable to obtain consent due to emergent nature of procedure.  Anesthesia currently intubated and sedated   Time Out Verified patient identification, verified procedure, site/side was marked, verified correct patient position, special equipment/implants available, medications/allergies/relevant history reviewed, required imaging and test results available.   Sterile Technique Maximal sterile technique including full sterile barrier drape, hand hygiene, sterile gown, sterile gloves, mask, hair covering, sterile ultrasound probe cover (if used).   Procedure Description Area of catheter insertion was cleaned with chlorhexidine and draped in sterile fashion. With real-time ultrasound guidance an arterial catheter was placed into the right femoral artery.  Appropriate arterial tracings confirmed on monitor.    LUE with old grafts, no pulsatile flow RUE with AVF  Complications/Tolerance None; patient tolerated the procedure well.   EBL Minimal   Specimen(s) None     Kennieth Rad, ACNP [facility] 08/23/2020, 4:06 PM

## 2020-08-23 NOTE — Brief Op Note (Signed)
08/23/2020  5:20 PM  PATIENT:  [name]  65 y.o. male  PRE-OPERATIVE DIAGNOSIS:  SPLEEN RUPTURE; hemopert  POST-OPERATIVE DIAGNOSIS:  SPLEEN RUPTURE  PROCEDURE:  Procedure(s): EXPLORATORY LAPAROTOMY (N/A) SPLENECTOMY (N/A) APPLICATION OF ABDOMINAL WOUND VAC (N/A)  SURGEON:  Surgeon(s) and Role:    Greer Pickerel, MD - Primary    * Georganna Skeans, MD - Assisting  PHYSICIAN ASSISTANT:   ASSISTANTS: Georganna Skeans MD FACS   ANESTHESIA:   general  EBL:  2500 mL   BLOOD ADMINISTERED:6u CC PRBC, 6u FFP and 1pack PLTS  DRAINS: Nasogastric Tube   LOCAL MEDICATIONS USED:  NONE  SPECIMEN:  Source of Specimen:  spleen in pieces  DISPOSITION OF SPECIMEN:  PATHOLOGY  COUNTS:  YES  TOURNIQUET:  * No tourniquets in log *  DICTATION: .Other Dictation: Dictation Number [id number]  PLAN OF CARE: already inpatient  PATIENT DISPOSITION:  ICU - intubated and critically ill.   Delay start of Pharmacological VTE agent (>24hrs) due to surgical blood loss or risk of bleeding: yes

## 2020-08-23 NOTE — Progress Notes (Signed)
Signed and held orders released per protocol which inadvertently discontinued certain orders. Spoke with the pharmacists and made sure Vanc and Zosyn were reordered. Also spoke to [facility] MD and made them aware. CCS also on floor at 2130 and made them aware.

## 2020-08-23 NOTE — Significant Event (Signed)
Rapid Response Event Note   Reason for Call :  hypotension  Initial Focused Assessment:  Patient lying in bed in trendelenburg He is alert and oriented complaining of difficulty breathing. Abdominal pain. Lung sounds decreased bases Heart tones regular. Pt cool to the touch, finger/toes/ears cold  BP 68/52  ST 115-120  RR 36-44  Unable to obtain O2 sats. Rectal temp 95 O2 via NRB mask  Interventions:  NS bolus Levophed started at 11mc increased to 20 mcg.  BP variable.  See VS flowsheet. ABG drawn Labs drawn by RN Patient remains alert, his main complaint it that he has difficulty breathing.  CT chest/abdomen/pelvis done  Transferred to ICU Plan of Care:     Event Summary:   MD Notified: Dr Waldron Labs at bedside Call Time: [place] Time: [id number] End Time: 1500  Raliegh Ip, RN

## 2020-08-23 NOTE — Progress Notes (Signed)
Around 2015 pts BP became suddenly unstable while pt was resting in bed not being stimulated. Pts BP running SBP 140s with no pressors running suddenly trended down to SBP in the 70s. Pressors titrated back on and up with little effect (IV pump not associated so pressor titration not reflected in Epic. But max dose of Levo was 40 mcg and Epi 15 mg). Pushed "ELINK button" in room and spoke to [place], Therapist, sports and Dr Oletta Darter about above events.  Stat labs obtained and sent. CVP 8.  Called and spoke to on call CCS MD above events and stat labs sent.   Pts BP slowly stabilized over about an hour. CCS MD at bedside around 2130 and made aware of wound vac canster output along with bloody oral secretions.

## 2020-08-23 NOTE — Progress Notes (Addendum)
Daily Rounding Note  08/23/2020, 8:42 AM  LOS: 3 days   SUBJECTIVE:   Chief complaint: GI bleed.    C/o nausea, non-bloody emesis.  Stool yellow,  Liquid/watery last PM.  No abd pain.  Severe back pain (acute on chronic).   Malaise continues.   Just shown photo of his stool on day of presentation, it was melena not hematochezia.    OBJECTIVE:         Vital signs in last 24 hours:    Temp:  [97.1 F (36.2 C)-98.5 F (36.9 C)] 97.4 F (36.3 C) (03/29 0641) Pulse Rate:  [73-103] 96 (03/29 0641) Resp:  [0-18] 13 (03/29 0641) BP: (80-166)/(35-84) 126/63 (03/29 0641) SpO2:  [100 %] 100 % (03/29 0641) Weight:  [49.4 kg] 49.4 kg (03/29 0300) Last BM Date: 08/21/20 Filed Weights   08/21/20 0011 08/22/20 0328 08/23/20 0300  Weight: 48.5 kg 50.3 kg 49.4 kg   General: looks chronically unwell   Heart: RRR Chest: no labored breathing or cough Abdomen: soft, NT, ND.  Active BS  Extremities: no CCE Neuro/Psych:  Alert. Oriented x 3.    Intake/Output from previous day: 03/28 0701 - 03/29 0700 In: 1070 [P.O.:480; I.V.:300; Blood:290] Out: -464   Intake/Output this shift: Total I/O In: 180.3 [I.V.:180.3] Out: -   Lab Results: Recent Labs    08/21/20 0409 08/21/20 1819 08/22/20 0500 08/23/20 0300  WBC 3.5*  --  3.8* 6.3  HGB 7.6* 9.5* 9.0* 6.9*  HCT 21.9* 27.9* 26.1* 20.2*  PLT 136*  --  123* 149*   BMET Recent Labs    08/21/20 0409 08/22/20 0500 08/23/20 0300  NA 135 131* 130*  K 3.7 4.1 4.4  CL 97* 94* 95*  CO2 28 26 21*  GLUCOSE 80 78 88  BUN 34* 38* 47*  CREATININE 5.82* 7.98* 9.81*  CALCIUM 9.2 9.3 9.2   LFT Recent Labs    08/21/20 0409  PROT 5.5*  ALBUMIN 3.0*  AST 17  ALT 11  ALKPHOS 69  BILITOT 1.0   PT/INR No results for input(s): LABPROT, INR in the last 72 hours. Hepatitis Panel Recent Labs    08/20/20 1956  HEPBSAG NON REACTIVE    Studies/Results: DG Chest Port 1  View  Result Date: 08/22/2020 CLINICAL DATA:  Chest EXAM: PORTABLE CHEST 1 VIEW COMPARISON:  Earlier same day FINDINGS: No new consolidation or edema. No pleural effusion. No pneumothorax. Stable cardiomediastinal contours with cardiomegaly. Right vascular stent. Left IJ central line tip overlies cavoatrial junction. IMPRESSION: No acute process in the chest. Electronically Signed   By: Macy Mis M.D.   On: 08/22/2020 15:07   DG CHEST PORT 1 VIEW  Result Date: 08/22/2020 CLINICAL DATA:  Hypoxia EXAM: PORTABLE CHEST 1 VIEW COMPARISON:  August 19, 2020 FINDINGS: Endotracheal tube tip is 3.4 cm above the carina. Central catheter tip is at the cavoatrial junction. No pneumothorax. The lungs are clear. Heart is mildly enlarged with pulmonary vascularity normal. No adenopathy. There is a right innominate/subclavian region stent, unchanged in position. IMPRESSION: Tube and catheter positions as described without pneumothorax. No edema or airspace opacity. Stable cardiac prominence. Electronically Signed   By: Lowella Grip III M.D.   On: 08/22/2020 11:03   DG ESOPHAGUS W SINGLE CM (SOL OR THIN BA)  Result Date: 08/22/2020 CLINICAL DATA:  Chest pain following recent colonoscopy with esophageal intubation. Evaluate for esophageal leak. Water-soluble study requested. EXAM: ESOPHOGRAM/BARIUM SWALLOW TECHNIQUE: Single contrast  examination was performed using Omnipaque 300 by mouth. FLUOROSCOPY TIME:  Fluoroscopy Time: 1 minutes and 18 seconds of low-dose pulsed fluoroscopy Radiation Exposure Index (if provided by the fluoroscopic device): 15.4 mGy Number of Acquired Spot Images: 1 COMPARISON:  Chest radiographs same date.  Chest CT 06/17/2018. FINDINGS: The patient has somewhat limited mobility. These emanation was performed in the AP semi erect and RPO supine positions. The patient swallowed the contrast without difficulty. There is mild esophageal dysmotility. No evidence of esophageal leak, mass or  ulceration. There is no laryngeal penetration or aspiration. Right subclavian/brachiocephalic vascular stent and left IJ central venous catheter are noted. IMPRESSION: No evidence of esophageal leak or other injury. Mild esophageal dysmotility. Electronically Signed   By: Richardean Sale M.D.   On: 08/22/2020 16:12   Scheduled Meds: . sodium chloride   Intravenous Once  . atorvastatin  40 mg Oral Daily  . chlorhexidine  15 mL Mouth/Throat QID  . Chlorhexidine Gluconate Cloth  6 each Topical Q0600  . ferric citrate  420 mg Oral TID  . hydrOXYzine  50 mg Oral Q0600  . pantoprazole  40 mg Intravenous Q12H  . traZODone  150 mg Oral QHS   Continuous Infusions: . pantoprozole (PROTONIX) infusion 8 mg/hr (08/23/20 0820)   PRN Meds:.acetaminophen **OR** acetaminophen, levalbuterol, lidocaine, methocarbamol, nitroGLYCERIN, ondansetron **OR** ondansetron (ZOFRAN) IV, oxyCODONE    ASSESMENT:   *   Painless hematochezia.  Resolved.   3/28 colonoscopy: 2 polyps removed.  Sigmoid and ascending tics, int hemorrhoids.  No active bleeding.  No ischemic colitis.  Suspect divertic bleed.      *   Resp failure, flash Pulm edema.  Intubated for Colonoscopy.  Intubation initially complicated by esophageal intubation.  CXR post colonscopy re-assuring, no ptx or mediastinal air  *   Anemia. ABL on chronic.   On Auryxia, CSA at home.  Low TIBC, normal iron/iron sat/B12/Folate, above normal ferritin.  Hgb 7.9 >> 6.4 >> 9.9 >> 6.9.  4th PRBC given this AM.   *   Nausea, non-bloody emesis.  PPI drip in place.    *   ESRD, on home HD pt.    *   Hx PE.  Not on [facility]  *  Acute on chronic back pain.  Oxycodone prn now and at home.     *   Thrombocytopenia.    *   Oral infection.  Brief Augmentin discontinued.    *   Hepatitis C.  Undetectable virus RNA in 03/2017. Cysts on liver, no cirrhosis, per 08/20/20 CT angio.    LFTs ok except albumin low at 3.  INR normal.    *   Hyponatremia.    *   PVD.  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        PLAN   *   Await colon polyp path reports.  Trend Hgb  *   Diet clear.  *   ? Need for EGD given nausea, non-bloody emesis?     Logan Martin  08/23/2020, 8:42 AM Phone [phone]   Attending physician's note   I have taken an interval history, reviewed the chart and examined the patient. I agree with the Advanced Practitioner's note, impression and recommendations.   Pt with multiple comorbidities  N/V with H/O melena Acute on chronic anemia ESRD on HD H/O hematochezia- neg colon 3/28 except for small colonic polyps, div. Had  resp compromise time of colon req intubation Now in shock ?septic, being transferred to ICU  Plan: Supportive care Transfuse to Hb>7 CT AP EGD once stable. IV protonix. Will follow along   Carmell Austria, MD [facility] [phone]

## 2020-08-23 NOTE — Transfer of Care (Signed)
Immediate Anesthesia Transfer of Care Note  Patient: Logan Martin  Procedure(s) Performed: EXPLORATORY LAPAROTOMY (N/A Abdomen) SPLENECTOMY (N/A Abdomen) APPLICATION OF ABDOMINAL WOUND VAC (N/A Abdomen)  Patient Location: ICU  Anesthesia Type:General  Level of Consciousness: Patient remains intubated per anesthesia plan  Airway & Oxygen Therapy: Patient remains intubated per anesthesia plan and Patient placed on Ventilator (see vital sign flow sheet for setting)  Post-op Assessment: Report given to RN and Post -op Vital signs reviewed and stable  Post vital signs: Reviewed and stable  Last Vitals:  Vitals Value Taken Time  BP    Temp    Pulse 94 08/23/20 1745  Resp 32 08/23/20 1745  SpO2 78 % 08/23/20 1745  Vitals shown include unvalidated device data.  Last Pain:  Vitals:   08/23/20 1312  TempSrc: Rectal  PainSc:          Complications: No complications documented.

## 2020-08-23 NOTE — Progress Notes (Signed)
Patient ID: Logan Martin, male   DOB: 1955-12-03, 65 y.o.   MRN: [id number] Patient with abrupt hypotension requiring pressors.  Labs all still pending.  They are now back and hb is fine.  By the time I saw him he was better with some volume.  His vac has filled one cannister but it is thin and abdomen  Otherwise soft.  Came out of OR with lactate of 6 and I just think still needs resuscitation.  No need for surgery again at this point.

## 2020-08-23 NOTE — OR Nursing (Signed)
2 18x18 laps left in LUQ of abdomen as intentional packing by Dr. Redmond Pulling. Confirmed by x-ray/radiologist.

## 2020-08-23 NOTE — Progress Notes (Signed)
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  Wean down as tolerated ABG reviewed, bring down FiO2, switch to 6 cc/kg tidal volume Deep sedation  Follow chest x-ray, labs  The patient is critically ill with multiple organ system failure and requires high complexity decision making for assessment and support, frequent evaluation and titration of therapies, advanced monitoring, review of radiographic studies and interpretation of complex data.   Critical Care Time devoted to patient care services, exclusive of separately billable procedures, described in this note is 35 minutes.   Marshell Garfinkel MD [facility] See Amion for pager  If no response to pager , please call [phone] until 7pm After 7:00 pm call Elink  [phone] 08/23/2020, 6:01 PM

## 2020-08-23 NOTE — Progress Notes (Signed)
Central Venous Catheter Insertion Procedure Note  CESARE SUMLIN  [id number]  09-20-1955  Date:08/23/20  Time:4:28 PM   Provider Performing:Ebunoluwa Gernert Redmond Pulling   Procedure: Insertion of Non-tunneled Central Venous Catheter(36556) without US guidance  Indication(s) Difficult access  Hemorrhagic shock  Consent Unable to obtain consent due to emergent nature of procedure.  Anesthesia General  Timeout Verified patient identification, verified procedure, site/side was marked, verified correct patient position, special equipment/implants available, medications/allergies/relevant history reviewed, required imaging and test results available.  Sterile Technique Maximal sterile technique including full sterile barrier drape, hand hygiene,  sterile gloves, mask, hair covering,   Procedure Description Area of catheter insertion was cleaned with chlorhexidine and draped in sterile fashion.  Without real-time ultrasound guidance a 8.5 Fr single lumen central venous catheter was placed into the left femoral vein on the 3rd attempt. Nonpulsatile blood flow and easy flushing noted in all ports.  The catheter was sutured in place and sterile dressing applied.  Complications/Tolerance None; patient tolerated the procedure well.   Chest x-ray is not ordered for femoral cannulation.  EBL Minimal  Specimen(s) None  Leighton Ruff. Redmond Pulling, MD, FACS General, Bariatric, & Minimally Invasive Surgery [facility], [place]

## 2020-08-23 NOTE — Progress Notes (Signed)
Patient brought to the floor as rapid response with CCM at the bedside during admission. Upon arrival patient had complaints of abdominal pain and increasing shortness of breath. Critical Hgb called, 1 unit PRBC transfused, and massive transfusion protocol activated. Multiple vasopressors started and arterial line place by CCM. General surgery also at the beside and once stabilized the patient was transported to OR. Daughters in the waiting room updated and escorted to the [place] upon request.

## 2020-08-23 NOTE — Progress Notes (Signed)
[facility] Progress Note   Subjective:  States he is having a lot of back pain today which is a chronic problem.  No more bloody stools.  Colonoscopy yesterday with several polyps removed.  He says that dialysis is hard to tolerate and he prefers to do dialysis at home.   Objective Vitals:   08/23/20 0611 08/23/20 0641 08/23/20 0815 08/23/20 0842  BP: 134/63 126/63 (!) 123/50 (!) 145/57  Pulse: 97 96 89   Resp: 12 13 12 16   Temp: (!) 97.1 F (36.2 C) (!) 97.4 F (36.3 C) (!) 97.3 F (36.3 C) (!) 97.4 F (36.3 C)  TempSrc: Axillary Oral Oral Oral  SpO2: 100% 100% 94% 100%  Weight:      Height:         Additional Objective Labs: Basic Metabolic Panel: Recent Labs  Lab 08/21/20 0409 08/22/20 0500 08/23/20 0300  NA 135 131* 130*  K 3.7 4.1 4.4  CL 97* 94* 95*  CO2 28 26 21*  GLUCOSE 80 78 88  BUN 34* 38* 47*  CREATININE 5.82* 7.98* 9.81*  CALCIUM 9.2 9.3 9.2   CBC: Recent Labs  Lab 08/19/20 2125 08/20/20 0404 08/20/20 1956 08/21/20 0409 08/21/20 1819 08/22/20 0500 08/23/20 0300  WBC 5.3 4.8  --  3.5*  --  3.8* 6.3  HGB 7.9* 6.4*   < > 7.6* 9.5* 9.0* 6.9*  HCT 25.2* 19.9*   < > 21.9* 27.9* 26.1* 20.2*  MCV 103.7* 101.5*  --  92.8  --  92.9 94.4  PLT 124* 167  --  136*  --  123* 149*   < > = values in this interval not displayed.   Blood Culture    Component Value Date/Time   SDES BLOOD SITE NOT SPECIFIED 08/20/2020 0404   SPECREQUEST AEROBIC BOTTLE ONLY Blood Culture adequate volume 08/20/2020 0404   CULT  08/20/2020 0404    NO GROWTH 3 DAYS Performed at [facility], [place] [address] [place], [place] [address]    REPTSTATUS PENDING 08/20/2020 0404     Physical Exam General: Non-toxic appearing, nad  Heart: normal rate Lungs: bilateral chest rise, no iwob Abdomen: soft non-distended  Extremities: No LE edema  Dialysis Access: RUE AVF +bruit   Medications:  . sodium chloride   Intravenous Once  . atorvastatin  40 mg  Oral Daily  . chlorhexidine  15 mL Mouth/Throat QID  . Chlorhexidine Gluconate Cloth  6 each Topical Q0600  . ferric citrate  420 mg Oral TID  . hydrOXYzine  50 mg Oral Q0600  . pantoprazole  40 mg Intravenous Q12H  . traZODone  150 mg Oral QHS    Dialysis Orders:  Nx Stage MTTF EDW 46.5kg   Assessment/Plan: 1. Tachycardia. Hx of PE.  Heart rate now improved.  Unlikely PE 2. Melena/GIB -  s/p 2 units of PRBCs.  Felt to be painless hematochezia by GI.  No active bleeding or signs of ischemic colitis.  Felt to be a diverticular bleed.  Hemoglobin 6.9.  Continue transfusions as needed, receiving 1 today. 3. ESRD -  Home HD. Will continue MWF while here. 4. Anemia --transfusions as above.  Received Aranesp 60 with HD 3/26.  5. Hypertension/volume  - BP controlled. . UF to dry weight as tolerated  6. Metabolic bone disease -  Continue Auryxia, calcitriol, Sensipar when taking po.  7. Oral abscess - now off Augmentin; oral hygiene    [facility] 08/23/2020,10:02 AM

## 2020-08-23 NOTE — Progress Notes (Signed)
Massive Blood Transfusion Protocol initiated 08/23/2020 at 15:40  per order Dr. Greer Pickerel . Blood Bank initiating now .Pt. has splenic rupture, HGB of 4.6 after initial transfusion 3/29 am for HGB of 6.9. Post first transfusion HGB responded to 8.0, and repeat HGB  Is 4.6 with hypotension and massive pressor need. Surgery is at bedside, orders have been placed. Family( granddaughter)  is being consented now for splenectomy to save the patient's life.    Magdalen Spatz, MSN, AGACNP-BC [place] for personal pager PCCM on call pager [phone] 08/23/2020 3:43 PM

## 2020-08-23 NOTE — Consult Note (Addendum)
NAME:  Logan Martin, MRN:  [id number], DOB:  [birth year], LOS: 3 ADMISSION DATE:  08/19/2020, CONSULTATION DATE:  08/23/2020 REFERRING MD:  Dr. Waldron Labs, CHIEF COMPLAINT:  hypotension  History of Present Illness:  65 year old male with prior history of ESRD, HTN, HLD, CAD, history of adenocarcinoma of the bladder, anemia, thrombocytopenia, hx of PE, hepatitis C presenting on 3/25 with progressive fatigue and generalized weakness with dark stools for 1 week.   Work-up significant for anemia and melanotic stools.  CTA abdomen pelvis showed no active source of GI bleeding.  It did note focal areas of high-grade stenosis within distal branches of the SMA in the right lower quadrant. Due to poor IV access, a central line was placed by IR.  Nephrology and [facility] consulted.  Underwent endoscopy on 3/28 which showed diverticulosis, 2 polyps were removed and sent for biopsy but showed no evidence of GI bleed.  Patient has required ongoing transfusion throughout hospitalization for ongoing anemia of unclear source.  On 3/29 patient acutely complained of worsening abdominal and chronic back pain, shortness of breath, was diaphoretic, and progressively hypotensive with SBP into the  40's.  Also noted to be hypothermic with rectal temp not reading.  Rapid response initiated.  A small fluid bolus started and then Levaquin started.  X-ray and KUB ordered.  Surgery and PCCM consulted urgently.  Patient remains awake although very hard of hearing complaining of diffuse abdominal pain and trying to use the bedpan with no results.    Addendum: on chart review, patient recently traveling.  Had iHD out of town, possibly Wednesday and was not able to get blood returned back.  Hgb of 7.9 on admit.    Pertinent  Medical History  ESRD (on home HD MTTF), HTN, HLD, CAD, history of adenocarcinoma of the bladder, anemia, thrombocytopenia, hx of PE, hepatitis C  Hard of hearing   Significant Hospital Events: Including  procedures, antibiotic start and stop dates in addition to other pertinent events   . 3/25 admitted to [facility], nephrology following  . 3/26 L IJ DL CVL placed by IR (of note, unable to pass via right IJ due to occlusion and lower right IJ at venous stent) . 3/27 [facility] consulted . 3/28 colonoscopy by Dr. Lyndel Safe with two polyps removed, required intubation during procedure for flash pulmonary edema; extubated post procedure . 3/29 transfused 4th unit PRBC this am.  During afternoon, acute decompensation/ hypotension on floor -> RRT, vasopresors started, stat CT abd/ pelvis and surgical consult -> to OR with ruptured spleen  Interim History / Subjective:  Currently on NE 20 mcg/min via L IJ DL CVL  Objective   Blood pressure 106/77, pulse 89, temperature (!) 96.9 F (36.1 C), temperature source Rectal, resp. rate (!) 36, height 5\' 4"  (1.626 m), weight 49.4 kg, SpO2 100 %.        Intake/Output Summary (Last 24 hours) at 08/23/2020 1413 Last data filed at 08/23/2020 5701 Gross per 24 hour  Intake 1180.29 ml  Output -564 ml  Net 1744.29 ml   Filed Weights   08/21/20 0011 08/22/20 0328 08/23/20 0300  Weight: 48.5 kg 50.3 kg 49.4 kg    Examination: General:  Ill appearing adult male lying in bed in distress HEENT: MM pale/moist, pupils 3/reactive Neuro: Awake, oriented to person, place, time, MAE CV: rr, ST, L DL IJ CVL, RUE AVF PULM:  Tachypneic in the 40's, on NRB, lungs clear/ diminished GI: soft, hypobs, diffuse tenderness Extremities: cool/dry, no edema  Skin: no rashes   Labs/imaging that I havepersonally reviewed  (right click and "Reselect all SmartList Selections" daily)  3/25 SARS/ flu >> neg 3/26 BCx >>  3/25 amoxicillin >> 3/26 3/29 zosyn >>  3/29 vanc >>  3/26 L IJ CVL>> 3/29 ETT >> 3/29 R femoral Aline >>  3/26 CTA abd/ pelvis >> VASCULAR 1. No evidence of active gastrointestinal hemorrhage. 2. Extensive atherosclerosis of the aorta and its branches. Focal  areas of high-grade stenosis within distal branches of the SMA in the right lower quadrant. High-grade bilateral renal artery stenosis, left greater than right NON-VASCULAR 1. Numerous bilateral hepatic cysts unchanged. 2. Bilateral renal cortical atrophy with diffuse cystic change, consistent with known end-stage renal disease. 3. No acute intra-abdominal or intrapelvic process. 4. Aortic Atherosclerosis and Emphysema   3/28 colonoscopy >> One 6 mm polyp in the proximal transverse colon, removed with a cold snare. Resected and retrieved.  One 6 mm polyp in the mid descending colon, removed with a cold snare. Resected and retrieved.  Diverticulosis in the sigmoid colon and in the ascending colon.  Non-bleeding internal hemorrhoids.  Resolved Hospital Problem list    Assessment & Plan:   Shock- hemorraghic secondary to splenic rupture - stat tx to ICU  - NE via L IJ CVL for MAP goal > 65, adding epi and Neo  - stat CTA abd/ pelvis-> showing new acute splenic rupture with acute large volume hemoperitoneum and moderate subcapsular splenic hematoma.   - Hgb 4.6.  Will send for repeat/ verification although given clinical appearance, this is likely correct.  Will stat transfuse, however patient has multiple antibodies with only 1 unit of PRBC available currently and working on more available units but will take time.  Given gravity of illness, will pre-mediate with solumedrol, pepcid and benadryl and will have secured airway and on epi gtt given transfusion benefits out weight risk at this point.  MTP activated given ongoing hemodynamic instability  - Surgery at bedside -> going emergently to OR  - KUB neg for bowel obstruction or ileus, CXR non acute process - stat CBC/ CMET/ coags/ troponin / lactate  - zosyn/ vanc per pharmacy - will place aline for BP accuracy    Acute hypoxic respiratory distress - CXR with no acute process - will intubate for hemodynamic instability and airway protection   - full MV support/ VAP bundle/ CXR / ABG  ESRD on HD - no acute indications for emergent iHD.  - Nephrology following  - repeat labs post-op   ABLA/ symptomatic anemia - acute change secondary to splenic rupture - ABLA prior to likely related to anemia of chronic disease, no other source of bleeding identified prior to 3/29 events  by imaging/ colonoscopy  - see above.  MTP initiated and patient pre-medicated given multiple antibodies - on chart review, patient recently traveling.  Had iHD out of town, possibly Wednesday and was not able to get blood returned back.  Hgb of 7.9 on admit.  Baseline ranges from 8- 12's.    Best practice (right click and "Reselect all SmartList Selections" daily)  Diet:  NPO Pain/Anxiety/Delirium protocol (if indicated): Yes (RASS goal -1,-2) VAP protocol (if indicated): Yes DVT prophylaxis: Contraindicated GI prophylaxis: H2B Glucose control:  SSI Yes Central venous access:  Yes, and it is still needed Arterial line:  Yes, and it is still needed Foley:  N/A Mobility:  bed rest  PT consulted: N/A Last date of multidisciplinary goals of care discussion [ongoing] Code Status:  full code Disposition: tx to ICU  Pending family update 3/29  Labs   CBC: Recent Labs  Lab 08/20/20 0404 08/20/20 1956 08/21/20 0409 08/21/20 1819 08/22/20 0500 08/23/20 0300 08/23/20 1020  WBC 4.8  --  3.5*  --  3.8* 6.3 5.8  HGB 6.4*   < > 7.6* 9.5* 9.0* 6.9* 8.0*  HCT 19.9*   < > 21.9* 27.9* 26.1* 20.2* 22.9*  MCV 101.5*  --  92.8  --  92.9 94.4 90.9  PLT 167  --  136*  --  123* 149* 130*   < > = values in this interval not displayed.    Basic Metabolic Panel: Recent Labs  Lab 08/19/20 1916 08/20/20 0404 08/20/20 1956 08/21/20 0409 08/22/20 0500 08/23/20 0300  NA  --  136 134* 135 131* 130*  K  --  5.0 3.3* 3.7 4.1 4.4  CL  --  97* 99 97* 94* 95*  CO2  --  23 29 28 26  21*  GLUCOSE  --  93 95 80 78 88  BUN  --  96* 30* 34* 38* 47*  CREATININE  --   9.76* 4.61* 5.82* 7.98* 9.81*  CALCIUM  --  9.5 9.0 9.2 9.3 9.2  MG 1.9  --   --   --   --   --    GFR: Estimated Creatinine Clearance: 5.3 mL/min (A) (by C-G formula based on SCr of 9.81 mg/dL (H)). Recent Labs  Lab 08/20/20 0021 08/20/20 0404 08/21/20 0409 08/22/20 0500 08/23/20 0300 08/23/20 1020  WBC  --  4.8 3.5* 3.8* 6.3 5.8  LATICACIDVEN 0.7 1.6  --   --   --   --     Liver Function Tests: Recent Labs  Lab 08/20/20 0404 08/21/20 0409  AST 18 17  ALT 12 11  ALKPHOS 74 69  BILITOT 1.1 1.0  PROT 6.2* 5.5*  ALBUMIN 3.4* 3.0*   No results for input(s): LIPASE, AMYLASE in the last 168 hours. No results for input(s): AMMONIA in the last 168 hours.  ABG    Component Value Date/Time   PHART 7.486 (H) 05/05/2020 0053   PCO2ART 37.2 05/05/2020 0053   PO2ART 82 (L) 05/05/2020 0053   HCO3 28.3 (H) 05/05/2020 0053   TCO2 30 05/05/2020 0053   ACIDBASEDEF 1.0 03/15/2020 1331   O2SAT 97.0 05/05/2020 0053     Coagulation Profile: Recent Labs  Lab 08/19/20 1855  INR 1.1    Cardiac Enzymes: No results for input(s): CKTOTAL, CKMB, CKMBINDEX, TROPONINI in the last 168 hours.  HbA1C: No results found for: HGBA1C  CBG: No results for input(s): GLUCAP in the last 168 hours.  Review of Systems:   Unable   Past Medical History:  He,  has a past medical history of Alcohol abuse, Anemia, Cancer ([place]), Chronic low back pain, Cocaine abuse in remission ([place]), ESRD (end stage renal disease) ([place]), ESRD on dialysis (02/16/2009), Hearing aid worn, Hepatitis C, History of blood transfusion (2007 X 1), History of kidney stones, constipation, Hypertension, Left inguinal hernia, Peripheral vascular disease ([place]), Pulmonary embolism [facility]) (Feb 2012), and Wears glasses.   Surgical History:   Past Surgical History:  Procedure Laterality Date  . A/V FISTULAGRAM N/A 11/22/2017   Procedure: A/V Fistulagram;  Surgeon: Serafina Mitchell, MD;  Location: [place] CV LAB;  Service:  Cardiovascular;  Laterality: N/A;  rt. arm   . ABDOMINAL HERNIA REPAIR  2000    x 2  . AV FISTULA PLACEMENT Right 2007  upper arm  . AV FISTULA REPAIR Right ~ 03/2016 & 04/2016  . BLADDER AUGMENTATION  1975  . COLONOSCOPY    . COLONOSCOPY WITH PROPOFOL N/A 08/22/2020   Procedure: COLONOSCOPY WITH PROPOFOL;  Surgeon: Jackquline Denmark, MD;  Location[facility] ENDOSCOPY;  Service: Endoscopy;  Laterality: N/A;  . CORONARY ANGIOPLASTY WITH STENT PLACEMENT  1999  . CYSTOSCOPY W/ URETERAL STENT PLACEMENT N/A 10/13/2017   Procedure: CYSTOSCOPY WITH IRRIGATION OF BLADDER PLACEMENT OF FOLEY;  Surgeon: Ceasar Mons, MD;  Location: [place];  Service: Urology;  Laterality: N/A;  . CYSTOSCOPY WITH FULGERATION N/A 03/27/2018   Procedure: [place] AND MUCOUS EVACUATION, BLADDER BIOPSY;  Surgeon: Franchot Gallo, MD;  Location: [facility];  Service: Urology;  Laterality: N/A;  . ESOPHAGOGASTRODUODENOSCOPY    . EXTRACORPOREAL SHOCK WAVE LITHOTRIPSY    . FISTULA SUPERFICIALIZATION Right 11/02/3417   Procedure: PLICATION OF ANEURYSM OF ARTERIOVENOUS FISTULA;  Surgeon: Angelia Mould, MD;  Location: [place];  Service: Vascular;  Laterality: Right;  . HERNIA REPAIR    . INGUINAL HERNIA REPAIR Right 1975  . INGUINAL HERNIA REPAIR Left 03/04/2019   Procedure: LEFT OPEN INGUINAL HERNIA REPAIR WITH MESH;  Surgeon: Kinsinger, Arta Bruce, MD;  Location: [facility];  Service: General;  Laterality: Left;  GENERAL AND LMA  . INSERTION OF DIALYSIS CATHETER Left 03/15/2020   Procedure: INSERTION OF Left Internal Jugular DIALYSIS CATHETER;  Surgeon: [name], MD;  Location: [place];  Service: Vascular;  Laterality: Left;  . IR FLUORO GUIDE CV LINE LEFT  03/11/2020  . IR FLUORO GUIDE CV LINE LEFT  08/20/2020  . IR US GUIDE VASC ACCESS LEFT  03/11/2020  . IR US GUIDE VASC ACCESS LEFT  08/20/2020  . IR US GUIDE VASC ACCESS RIGHT  08/20/2020  . IR VENO/JUGULAR RIGHT  08/20/2020  . LEFT HEART CATH AND  CORONARY ANGIOGRAPHY N/A 05/06/2020   Procedure: LEFT HEART CATH AND CORONARY ANGIOGRAPHY;  Surgeon: Nigel Mormon, MD;  Location: [place] CV LAB;  Service: Cardiovascular;  Laterality: N/A;  . PERIPHERAL VASCULAR BALLOON ANGIOPLASTY  11/22/2017   Procedure: PERIPHERAL VASCULAR BALLOON ANGIOPLASTY;  Surgeon: Serafina Mitchell, MD;  Location: [place] [facility] LAB;  Service: Cardiovascular;;  rt. arm fistula  . PERIPHERAL VASCULAR CATHETERIZATION N/A 06/22/2016   Procedure: Left Arm Venography;  Surgeon: Elam Dutch, MD;  Location: [place];  Service: Cardiovascular;  Laterality: N/A;  . PERIPHERAL VASCULAR CATHETERIZATION N/A 06/22/2016   Procedure: A/V Fistulagram - Right Arm;  Surgeon: Elam Dutch, MD;  Location: [place] [facility] LAB;  Service: Cardiovascular;  Laterality: N/A;  . PERIPHERAL VASCULAR CATHETERIZATION Right 06/22/2016   Procedure: Peripheral Vascular Balloon Angioplasty;  Surgeon: Elam Dutch, MD;  Location: [place] [facility] LAB;  Service: Cardiovascular;  Laterality: Right;  arm fistula  . POLYPECTOMY  08/22/2020   Procedure: POLYPECTOMY;  Surgeon: Jackquline Denmark, MD;  Location: [facility] ENDOSCOPY;  Service: Endoscopy;;  . REVISON OF ARTERIOVENOUS FISTULA Right 03/15/2020   Procedure: RIGHT ARM ARTERIOVENOUS FISTULA REVISON;  Surgeon: [name], MD;  Location: [place];  Service: Vascular;  Laterality: Right;     Social History:   reports that he quit smoking about 27 years ago. His smoking use included cigarettes. He has a 5.00 pack-year smoking history. He has never used smokeless tobacco. He reports previous alcohol use. He reports previous drug use. Drugs: Marijuana and Cocaine.   Family History:  His family history includes Cancer in his mother;  Deep vein thrombosis in his daughter; Diabetes in his mother; Hypertension in his mother.   Allergies Allergies  Allergen Reactions  . Heparin Rash    Pork products   . Pork-Derived Products Rash  . Sulfa  Antibiotics Rash     Home Medications  Prior to Admission medications   Medication Sig Start Date End Date Taking? Authorizing Provider  amLODipine (NORVASC) 10 MG tablet Take 1 tablet (10 mg total) by mouth daily. 03/18/20  Yes Aline August, MD  aspirin EC 81 MG tablet Take 81 mg by mouth at bedtime.    Yes [provider]  atorvastatin (LIPITOR) 40 MG tablet Take 40 mg by mouth daily. 01/20/20  Yes [provider]  AURYXIA 1 GM 210 MG(Fe) tablet Take 420 mg by mouth 3 (three) times daily. 02/12/20  Yes [provider]  hydrALAZINE (APRESOLINE) 25 MG tablet Take 1 tablet (25 mg total) by mouth 3 (three) times daily. 05/07/20  Yes Bonnell Public, MD  hydrOXYzine (VISTARIL) 50 MG capsule Take 50 mg by mouth daily at 6 (six) AM. 08/17/20  Yes [provider]  isosorbide mononitrate (IMDUR) 30 MG 24 hr tablet Take 30 mg by mouth daily.  08/15/18 08/19/20 Yes [provider]  labetalol (NORMODYNE) 100 MG tablet Take 100 mg by mouth daily. 08/03/20  Yes [provider]  lidocaine-prilocaine (EMLA) cream Apply 1 application topically See admin instructions. Apply small amount to dialysis port (AVF) one hour before dialysis. Cover with occlusive dressing (saran wrap) 11/14/17  Yes [provider]  LOKELMA 5 g packet Take 1 packet by mouth daily. 08/03/20  Yes [provider]  methocarbamol (ROBAXIN) 750 MG tablet Take 750 mg by mouth every 8 (eight) hours as needed for muscle spasms. 08/12/20  Yes [provider]  oxyCODONE (OXY IR/ROXICODONE) 5 MG immediate release tablet Take 5 mg by mouth 2 (two) times daily as needed for moderate pain. 04/29/20  Yes [provider]  sucralfate (CARAFATE) 1 g tablet Take 1 g by mouth daily. 08/12/20  Yes [provider]  traZODone (DESYREL) 150 MG tablet Take 150 mg by mouth daily as needed. 08/17/20  Yes [provider]  Vitamin D, Ergocalciferol, (DRISDOL) 1.25 MG  (50000 UNIT) CAPS capsule Take 50,000 Units by mouth once a week. 08/03/20  Yes [provider]     Critical care time: 90 mins     Kennieth Rad, ACNP [facility] 08/23/2020, 4:25 PM   See Amion for pager If no response to pager, please call PCCM pager  After 7:00 pm call Elink

## 2020-08-23 NOTE — Progress Notes (Signed)
Pt called nurses station c/o ABD pain and sweating profusely. Vital signs obtained and MD notified. BP 96/66, HR 122, 100% on 4L [employer]. Rectal temp 96.52F. MD at bedside to evaluate pt. Rapid and SWOT nurse called to bedside. See rapid response charting. Received transfer orders, report given to ICU. Pt transferred off unit to CT with rapid response RN.

## 2020-08-23 NOTE — Progress Notes (Signed)
eLink Physician-Brief Progress Note Patient Name: Logan Martin DOB: 1955/09/16 MRN: [id number]   Date of Service  08/23/2020  HPI/Events of Note  Hypotension - SBP dropped to 70's. BP now 126/50 with MAP = 67. Last Hbg = 10.5. Patient s/p splenectomy.  eICU Interventions  Plan: 1. H/H STAT. 2. Monitor CVP now and Q 4 hours.  3. Bedside nurse to notify surgery about the sudden hypotension.      Intervention Category Major Interventions: Hypotension - evaluation and management  Monai Hindes [place] 08/23/2020, 8:30 PM

## 2020-08-23 NOTE — Progress Notes (Signed)
Pharmacy Antibiotic Note  Logan Martin is a 65 y.o. male admitted on 08/19/2020 with sepsis.  Pharmacy has been consulted for piperacillin/tazobactam dosing. Of note patient had small oral abscess at admission but was only on one day of amoxicillin/clavulaunate and deemed to have resolved.   Temp 96.9, HR 110s, BP dropped requiring pressors, WBC wnl.   Plan: Pip/tazo 3.375g Q 12hr EI  Monitor cultures, clinical status, renal fx Narrow abx as able and f/u duration   Height: 5\' 4"  (162.6 cm) Weight: 49.4 kg (108 lb 14.5 oz) (Patient was too weak to stand) IBW/kg (Calculated) : 59.2  Temp (24hrs), Avg:97.6 F (36.4 C), Min:96.9 F (36.1 C), Max:98.5 F (36.9 C)  Recent Labs  Lab 08/20/20 0021 08/20/20 0404 08/20/20 1956 08/21/20 0409 08/22/20 0500 08/23/20 0300 08/23/20 1020  WBC  --  4.8  --  3.5* 3.8* 6.3 5.8  CREATININE  --  9.76* 4.61* 5.82* 7.98* 9.81*  --   LATICACIDVEN 0.7 1.6  --   --   --   --   --     Estimated Creatinine Clearance: 5.3 mL/min (A) (by C-G formula based on SCr of 9.81 mg/dL (H)).    Allergies  Allergen Reactions  . Heparin Rash    Pork products   . Pork-Derived Products Rash  . Sulfa Antibiotics Rash    Antimicrobials this admission: piptazo 3/29 >>  Amox/clav 3/26  Microbiology results: 3/26 BCx: ngtd   Thank you for allowing pharmacy to be a part of this patient's care.  Benetta Spar, PharmD, BCPS, BCCP Clinical Pharmacist  Please check AMION for all [place] phone numbers After 10:00 PM, call [place] [phone]

## 2020-08-23 NOTE — Procedures (Signed)
Intubation Procedure Note  Logan Martin  [id number]  1956-01-07  Date:08/23/20  Time:5:54 PM   Provider Performing:Makeshia Seat    Procedure: Intubation (31500)  Indication(s) Respiratory Failure  Consent Unable to obtain consent due to emergent nature of procedure.   Anesthesia Etomidate, Versed, Fentanyl and Rocuronium   Time Out Verified patient identification, verified procedure, site/side was marked, verified correct patient position, special equipment/implants available, medications/allergies/relevant history reviewed, required imaging and test results available.   Sterile Technique Usual hand hygeine, masks, and gloves were used   Procedure Description Patient positioned in bed supine.  Sedation given as noted above.  Patient was intubated with endotracheal tube using Glidescope.  View was Grade 1 full glottis .  Number of attempts was 1.  Colorimetric CO2 detector was consistent with tracheal placement.   Complications/Tolerance None; patient tolerated the procedure well. Chest X-ray is ordered to verify placement.   EBL Minimal   Specimen(s) None  Logan Garfinkel MD [facility] See Amion for pager  If no response to pager , please call [phone] until 7pm After 7:00 pm call Elink  [phone] 08/23/2020, 5:54 PM

## 2020-08-23 NOTE — Anesthesia Preprocedure Evaluation (Signed)
Anesthesia Evaluation  Patient identified by MRN, date of birth, ID band  Reviewed: Allergy & Precautions, H&P , NPO status , Patient's Chart, lab work & pertinent test results, reviewed documented beta blocker date and time , Unable to perform ROS - Chart review onlyPreop documentation limited or incomplete due to emergent nature of procedure.  Airway Mallampati: Intubated       Dental   Pulmonary neg pulmonary ROS, former smoker,  Quit smoking 1995,  Pack year history     + decreased breath sounds  + intubated    Cardiovascular hypertension, Pt. on home beta blockers and Pt. on medications + CAD, + Past MI, + Peripheral Vascular Disease and +CHF  + Valvular Problems/Murmurs MR   Echo 04/2020 1. Compared to echo report from 2014, LV function and MR are not significantly changed.  2. Left ventricular ejection fraction, by estimation, is 50%. The left ventricle has mildly decreased function. The left ventricle has no regional wall motion abnormalities. The left ventricular internal cavity size was severely dilated. There is mild left ventricular hypertrophy. Indeterminate diastolic filling due to E-A fusion.  3. Right ventricular systolic function is low normal. The right ventricular size is normal. There is mildly elevated pulmonary artery systolic pressure.  4. Left atrial size was severely dilated.  5. A small pericardial effusion is present.  6. MR is eccentric, directed posterior into LA At least moderately severe. Consider TEE to further define. . The mitral valve is abnormal. Moderate to severe mitral valve regurgitation.  7. Tricuspid valve regurgitation is moderate.  8. The aortic valve is tricuspid. Aortic valve regurgitation is not visualized. Mild aortic valve sclerosis is present, with no evidence of aortic valve stenosis.  9. Aortic dilatation noted. There is mild dilatation of the aortic root, measuring 40 mm.  10. The  inferior vena cava is normal in size with greater than 50% respiratory variability, suggesting right atrial pressure of 3 mmHg.     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 Started HD in [place], [place].  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  On NxStage home hemo since 2010. Using RUA AVF.     Hx bladder ca negative genitourinary   Musculoskeletal negative musculoskeletal ROS (+) Chronic LBP- on oxy 30mg    Abdominal   Peds  Hematology  (+) Blood dyscrasia, anemia ,   Anesthesia Other Findings Colonoscopy for melena, Hgb 9.5  Reproductive/Obstetrics negative OB ROS                             Anesthesia Physical  Anesthesia Plan  ASA: IV and emergent  Anesthesia Plan: General   Post-op Pain Management:    Induction: Intravenous  PONV Risk Score and Plan: 1 and Treatment may vary due to age or medical condition and Ondansetron  Airway Management Planned: Oral ETT  Additional Equipment: None  Intra-op Plan:   Post-operative Plan: Post-operative intubation/ventilation  Informed Consent: I have reviewed the patients History and Physical, chart, labs and discussed the procedure including the risks, benefits and alternatives for the proposed anesthesia with the patient or authorized representative who has indicated his/her understanding and acceptance.  Dental advisory given  Plan Discussed with: CRNA  Anesthesia Plan Comments: (Allergy to heparin- gets alteplase for HD cath lock)        Anesthesia Quick Evaluation

## 2020-08-23 NOTE — Progress Notes (Signed)
Date and time results received: 08/23/20   03:37   Test: Hgb Critical Value: 6.9  Name of Provider Notified: Opyd  Orders Received? Yes

## 2020-08-23 NOTE — Progress Notes (Signed)
Pharmacy Antibiotic Note  Logan Martin is a 65 y.o. male admitted on 08/19/2020 with sepsis.  Earlier today, pharmacy was consulted for piperacillin/tazobactam dosing. Of note, patient had small oral abscess at admission but was only on one day of amoxicillin/clavulaunate and deemed to have resolved. Pharmacy is now consulted for vancomycin dosing for sepsis.  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  Plan: Zosyn 2.25 IV Q 8 hrs Vancomycin 1 gm IV X 1, followed by vancomycin 500 mg IV with each HD Monitor WBC, temp, clinical improvement, cultures  Height: 5\' 4"  (162.6 cm) Weight: 49.4 kg (108 lb 14.5 oz) (Patient was too weak to stand) IBW/kg (Calculated) : 59.2  Temp (24hrs), Avg:97.6 F (36.4 C), Min:96.9 F (36.1 C), Max:98.5 F (36.9 C)  Recent Labs  Lab 08/20/20 0021 08/20/20 0404 08/20/20 1956 08/21/20 0409 08/22/20 0500 08/23/20 0300 08/23/20 1020 08/23/20 1331 08/23/20 1400  WBC  --  4.8  --  3.5* 3.8* 6.3 5.8 4.5  --   CREATININE  --  9.76* 4.61* 5.82* 7.98* 9.81*  --   --   --   LATICACIDVEN 0.7 1.6  --   --   --   --   --   --  6.3*    Estimated Creatinine Clearance: 5.3 mL/min (A) (by C-G formula based on SCr of 9.81 mg/dL (H)).    Allergies  Allergen Reactions  . Heparin Rash    Pork products   . Pork-Derived Products Rash  . Sulfa Antibiotics Rash    Antimicrobials this admission: Zosyn 3/29 >>  Augmentin 3/26 Vancomycin >>  Microbiology results: 3/26 BCx X 2: NGTD 3/26 HBsAG: negative  Thank you for allowing pharmacy to be a part of this patient's care.  Gillermina Hu, PharmD, BCPS, [facility] Clinical Pharmacist

## 2020-08-23 NOTE — Progress Notes (Signed)
PROGRESS NOTE                                                                             PROGRESS NOTE                                                                                                                                                                                                             Patient Demographics:    Logan Martin, is a 65 y.o. male, DOB - September 25, 1955, BOF:[id number]  Outpatient Primary MD for the patient is [name], NP    LOS - 3  Admit date - 08/19/2020    Chief Complaint  Patient presents with  . Chest Pain       Brief Narrative     Logan Martin is a 65 y.o. male, with PMH of ESRD, HTN, HLD, CAD, history of adenocarcinoma of the bladder, anemia, thrombocytopenia, hx of PE, hepatitis C who presented to the ER on 08/19/2020 with general malaise, tachycardia.  His work-up was significant for anemia, as well he was noted to have melena, CTA abdomen pelvis with no source of GI bleed, patient went for colonoscopy 3/28, which was significant for polyps, and diverticulosis, but no evidence of GI bleed, patient remains with significant anemia requiring blood transfusions, for he was transfused 4 units PRBC, this afternoon patient with significant abdominal pain, hypotensive, where PCCM were consulted and he is transferred to ICU.    Subjective:    Logan Martin today with complaints of abdominal pain, lightheadedness, shortness of breath and reported feeling sweaty    Assessment  & Plan :    Active Problems:   Atherosclerosis of native coronary artery of native heart without angina pectoris   ESRD on dialysis ([place])   Anemia associated with chronic renal failure   Pancytopenia (HCC)   Peripheral vascular disease (HCC)   Tachycardia   Oral infection   Shock -Patient with significant abdominal pain, he is hypotensive, with significant abdominal pain, so PCCM were consulted, he is currently requiring Levophed  pressor support, imaging including abdominal x-ray, CT chest abdomen pelvis with IV contrast were ordered and he will be started empirically on antibiotics.  Symptomatic anemia/acute blood loss anemia -With underlining anemia of chronic kidney disease -Secondary to GI bleed, given he is having significant melena. -Juliane this morning is 6.9, he received his fourth unit PRBC transfusion, repeat CBC globin at 9 . -H&H closely and transfuse as needed -Procrit and IV iron per renal  Acute blood loss anemia/GI bleed -Required 4 units PRBC transfusion. -CTA abdomen pelvis with no evidence of GI bleed, went for colonoscopy this morning, significant for polyp and diverticulosis and ascending colon. -Continue with Protonix  Chest pain -Patient required intubation for colonoscopy given flash pulmonary edema during procedure. -complained  of chest pain 3/28 , EKG nonacute, troponins non-ACS pattern. - DG esophagus with water-soluble contrast with no evidence of esophageal leak or trauma.  ESRD on hemodialysis -Renal consulted, HD per renal   Hyperkalemia -Secondary to ESRD -He is on rescheduled Lokelma, I will discontinue given potassium 3.7 today.  Oral infection -This appears to be resolved this morning with good oral hygiene, it was small underlying infection, discontinue Augmentin, continue with chlorhexidine mouthwash and good oral hygiene.    Essential hypertension -Hold medication given GI bleed.     SpO2: 100 % O2 Flow Rate (L/min): 4 L/min  Recent Labs  Lab 08/19/20 1855 08/19/20 1916 08/19/20 2125 08/20/20 0021 08/20/20 0404 08/21/20 0409 08/22/20 0500 08/23/20 0300 08/23/20 1020  WBC  --   --    < >  --  4.8 3.5* 3.8* 6.3 5.8  PLT  --   --    < >  --  167 136* 123* 149* 130*  AST  --   --   --   --  18 17  --   --   --   ALT  --   --   --   --  12 11  --   --   --   ALKPHOS  --   --   --   --  74 69  --   --   --   BILITOT  --   --   --   --  1.1 1.0  --   --    --   ALBUMIN  --   --   --   --  3.4* 3.0*  --   --   --   INR 1.1  --   --   --   --   --   --   --   --   LATICACIDVEN  --   --   --  0.7 1.6  --   --   --   --   SARSCOV2NAA  --  NEGATIVE  --   --   --   --   --   --   --    < > = values in this interval not displayed.       ABG     Component Value Date/Time   PHART 7.486 (H) 05/05/2020 0053   PCO2ART 37.2 05/05/2020 0053   PO2ART 82 (L) 05/05/2020 0053   HCO3 28.3 (H) 05/05/2020 0053   TCO2 30 05/05/2020 0053   ACIDBASEDEF 1.0 03/15/2020 1331   O2SAT 97.0 05/05/2020 0053      Condition - Extremely Guarded  Family Communication  :  full  Code Status :  Full  Consults  :  GI,renal  Procedures  :  Right IJ line placement by IR 3/27 Colonoscopy 3/28  Disposition Plan  :    Status is: Inpatient  Remains inpatient appropriate because:IV treatments appropriate due to intensity of illness or inability to take PO   Dispo: The patient is from: Home              Anticipated d/c is to: Home              Patient currently is not medically stable to d/c.   Difficult to place patient No      DVT Prophylaxis  :   SCDs   Lab Results  Component Value Date   PLT 130 (L) 08/23/2020    Diet :  Diet Order            Diet clear liquid Room service appropriate? Yes; Fluid consistency: Thin  Diet effective now                  Inpatient Medications  Scheduled Meds: . sodium chloride   Intravenous Once  . atorvastatin  40 mg Oral Daily  . chlorhexidine  15 mL Mouth/Throat QID  . Chlorhexidine Gluconate Cloth  6 each Topical Q0600  . fentaNYL      . fentaNYL (SUBLIMAZE) injection  12.5 mcg Intravenous Once  . ferric citrate  420 mg Oral TID  . hydrOXYzine  50 mg Oral Q0600  . pantoprazole  40 mg Intravenous Q12H  . traZODone  150 mg Oral QHS   Continuous Infusions: . DOPamine    . norepinephrine (LEVOPHED) Adult infusion 20 mcg/min (08/23/20 1400)  . sodium chloride     PRN Meds:.acetaminophen **OR**  acetaminophen, levalbuterol, lidocaine, methocarbamol, nitroGLYCERIN, ondansetron **OR** ondansetron (ZOFRAN) IV, oxyCODONE  Antibiotics  :    Anti-infectives (From admission, onward)   Start     Dose/Rate Route Frequency Ordered Stop   08/20/20 0415  amoxicillin-clavulanate (AUGMENTIN) 500-125 MG per tablet 500 mg  Status:  Discontinued        1 tablet Oral Daily 08/20/20 0329 08/21/20 1258   08/19/20 2345  Ampicillin-Sulbactam (UNASYN) 3 g in sodium chloride 0.9 % 100 mL IVPB  Status:  Discontinued        3 g 200 mL/hr over 30 Minutes Intravenous  Once 08/19/20 2333 08/20/20 0329        Emeline Gins Deena Shaub M.D on 08/23/2020 at 2:26 PM  To page go to [email or web address]   [facility] -  Office  [phone]    Objective:   Vitals:   08/23/20 1354 08/23/20 1357 08/23/20 1358 08/23/20 1359  BP: 90/74 (!) 73/44 (!) 85/45 106/77  Pulse:      Resp:  (!) 30 (!) 30 (!) 36  Temp:      TempSrc:      SpO2:      Weight:      Height:        Wt Readings from Last 3 Encounters:  08/23/20 49.4 kg  05/07/20 45.1 kg  03/29/20 50.2 kg     Intake/Output Summary (Last 24 hours) at 08/23/2020 1426 Last data filed at 08/23/2020 0842 Gross per 24 hour  Intake 1180.29 ml  Output -564 ml  Net 1744.29 ml     Physical Exam  Awake Alert,ill-appearing, in distress due to abdominal  Symmetrical Chest wall movement, Good air movement bilaterally, tachypneic, no wheezing or crackles Tachycardic, no rubs or parasternal heaves Abdomen significantly tender to palpation, with guarding and rigidity . No Cyanosis, Clubbing or edema, No new Rash  or bruise     Data Review:    CBC Recent Labs  Lab 08/20/20 0404 08/20/20 1956 08/21/20 0409 08/21/20 1819 08/22/20 0500 08/23/20 0300 08/23/20 1020  WBC 4.8  --  3.5*  --  3.8* 6.3 5.8  HGB 6.4*   < > 7.6* 9.5* 9.0* 6.9* 8.0*  HCT 19.9*   < > 21.9* 27.9* 26.1* 20.2* 22.9*  PLT 167  --  136*  --  123* 149* 130*  MCV 101.5*  --  92.8  --   92.9 94.4 90.9  MCH 32.7  --  32.2  --  32.0 32.2 31.7  MCHC 32.2  --  34.7  --  34.5 34.2 34.9  RDW 16.1*  --  19.3*  --  17.9* 17.2* 16.7*   < > = values in this interval not displayed.    Recent Labs  Lab 08/19/20 1855 08/19/20 1916 08/20/20 0021 08/20/20 0404 08/20/20 1956 08/21/20 0409 08/22/20 0500 08/23/20 0300  NA 138  --   --  136 134* 135 131* 130*  K 5.2*  --   --  5.0 3.3* 3.7 4.1 4.4  CL 96*  --   --  97* 99 97* 94* 95*  CO2 29  --   --  23 29 28 26  21*  GLUCOSE 90  --   --  93 95 80 78 88  BUN 89*  --   --  96* 30* 34* 38* 47*  CREATININE 9.27*  --   --  9.76* 4.61* 5.82* 7.98* 9.81*  CALCIUM 9.2  --   --  9.5 9.0 9.2 9.3 9.2  AST  --   --   --  18  --  17  --   --   ALT  --   --   --  12  --  11  --   --   ALKPHOS  --   --   --  74  --  69  --   --   BILITOT  --   --   --  1.1  --  1.0  --   --   ALBUMIN  --   --   --  3.4*  --  3.0*  --   --   MG  --  1.9  --   --   --   --   --   --   LATICACIDVEN  --   --  0.7 1.6  --   --   --   --   INR 1.1  --   --   --   --   --   --   --     ------------------------------------------------------------------------------------------------------------------ No results for input(s): CHOL, HDL, LDLCALC, TRIG, CHOLHDL, LDLDIRECT in the last 72 hours.  No results found for: HGBA1C ------------------------------------------------------------------------------------------------------------------ No results for input(s): TSH, T4TOTAL, T3FREE, THYROIDAB in the last 72 hours.  Invalid input(s): FREET3  Cardiac Enzymes No results for input(s): CKMB, TROPONINI, MYOGLOBIN in the last 168 hours.  Invalid input(s): CK ------------------------------------------------------------------------------------------------------------------    Component Value Date/Time   BNP 3,725.8 (H) 05/05/2020 1602    Micro Results Recent Results (from the past 240 hour(s))  SARS CORONAVIRUS 2 (TAT 6-24 HRS) Nasopharyngeal Nasopharyngeal Swab      Status: None   Collection Time: 08/19/20  7:16 PM   Specimen: Nasopharyngeal Swab  Result Value Ref Range Status   SARS Coronavirus 2 NEGATIVE NEGATIVE Final    Comment: (NOTE) SARS-CoV-2 target nucleic acids  are NOT DETECTED.  The SARS-CoV-2 RNA is generally detectable in upper and lower respiratory specimens during the acute phase of infection. Negative results do not preclude SARS-CoV-2 infection, do not rule out co-infections with other pathogens, and should not be used as the sole basis for treatment or other patient management decisions. Negative results must be combined with clinical observations, patient history, and epidemiological information. The expected result is Negative.  Fact Sheet for Patients: SugarRoll.be  Fact Sheet for Healthcare Providers: https://www.woods-mathews.com/  This test is not yet approved or cleared by the [place] FDA and  has been authorized for detection and/or diagnosis of SARS-CoV-2 by FDA under an Emergency Use Authorization (EUA). This EUA will remain  in effect (meaning this test can be used) for the duration of the COVID-19 declaration under Se ction 564(b)(1) of the Act, 21 U.S.C. section 360bbb-3(b)(1), unless the authorization is terminated or revoked sooner.  Performed at [facility], [place] [address], [place], [place] [address]   Blood culture (routine x 2)     Status: None (Preliminary result)   Collection Time: 08/20/20 12:21 AM   Specimen: BLOOD  Result Value Ref Range Status   Specimen Description BLOOD SITE NOT SPECIFIED  Final   Special Requests   Final    BOTTLES DRAWN AEROBIC AND ANAEROBIC Blood Culture adequate volume   Culture   Final    NO GROWTH 3 DAYS Performed at [facility], [address], [place], [place] [address]    Report Status PENDING  Incomplete  Blood culture (routine x 2)     Status: None (Preliminary result)   Collection Time: 08/20/20   4:04 AM   Specimen: BLOOD  Result Value Ref Range Status   Specimen Description BLOOD SITE NOT SPECIFIED  Final   Special Requests AEROBIC BOTTLE ONLY Blood Culture adequate volume  Final   Culture   Final    NO GROWTH 3 DAYS Performed at [facility], [address], [place], [place] [address]    Report Status PENDING  Incomplete    Radiology Reports DG Chest 2 View  Result Date: 08/19/2020 CLINICAL DATA:  Chest pain shortness of breath EXAM: CHEST - 2 VIEW COMPARISON:  Chest radiograph May 04, 2020 FINDINGS: Right subclavian/innominate stent, unchanged. Mild cardiac enlargement. Central vascular congestion. Mild basilar predominant interstitial opacities. The visualized skeletal structures are unremarkable. IMPRESSION: Mild cardiac enlargement with central vascular congestion and basilar predominant interstitial opacities, likely mild edema. Electronically Signed   By: Dahlia Bailiff MD   On: 08/19/2020 20:18   IR Veno/Jugular Right  Result Date: 08/20/2020 INDICATION: 65 year old with end-stage renal disease, hematochezia, anemia and poor venous access. Patient needs a central venous catheter. EXAM: PLACEMENT OF CENTRAL LINE WITH ULTRASOUND AND FLUOROSCOPIC GUIDANCE CENTRAL VENOGRAM COMPARISON:  None. MEDICATIONS: None ANESTHESIA/SEDATION: None FLUOROSCOPY TIME:  3 minutes, 48 seconds, 46.8 mGy) COMPLICATIONS: None immediate. PROCEDURE: The procedure, risks, benefits, and alternatives were explained to the patient. Questions regarding the procedure were encouraged and answered. The patient understands and consents to the procedure. Right side of the neck was evaluated with ultrasound. Ultrasound confirmed a patent right internal jugular vein. Ultrasound image was saved for documentation. Right side of the neck was prepped and draped in sterile fashion. Maximal barrier sterile technique was utilized including caps, mask, sterile gowns, sterile gloves, sterile drape, hand hygiene and  skin antiseptic. Skin was anesthetized with 1% lidocaine. 21 gauge needle was directed into the right internal jugular vein but  a wire would not advance centrally. Micropuncture dilator set was placed. Contrast was injected for a venogram. 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 Microcatheter was removed with manual compression. Dressing was placed. Ultrasound confirmed a patent left internal jugular vein. Ultrasound image was saved for documentation. Left side of the neck was prepped and draped in sterile fashion. Maximal barrier sterile technique was utilized including caps, mask, sterile gowns, sterile gloves, sterile drape, hand hygiene and skin antiseptic. Skin was anesthetized using 1% lidocaine. Small incision was made. 21 gauge needle was directed into the left internal jugular vein with ultrasound guidance. Micropuncture dilator set was placed. Wire would not easily advance into the SVC due to the right innominate stent. Venogram was performed to confirm patency. Kumpe catheter and Glidewire were used to cannulate the SVC from the left jugular approach. A Platinum Plus wire was placed and the catheter was removed. Peel-away sheath was placed. A dual lumen Power PICC line was cut to 23 cm and advanced over the wire into the SVC. Wire was removed. Both lumens aspirated and flushed well. Catheter was sutured to the skin. Dressing was placed. 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 Wire would not advance into the SVC from right jugular approach. 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 Central venogram from the left jugular approach confirmed patency of the left innominate vein but there is significant narrowing at the junction of the left innominate vein and SVC related to the right innominate vein stent. Eventually a Kumpe catheter and Glidewire were  able to cannulate the SVC from a left jugular approach. The central venous catheter was positioned near the superior cavoatrial junction. IMPRESSION: Successful placement of a central venous catheter using ultrasound and fluoroscopic guidance. Catheter was placed from a left jugular approach. 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 Right jugular vein is patent in the neck but the lower portion of the right jugular vein is occluded from the right innominate vein stent. Patient is not a candidate for future right jugular central venous access. Electronically Signed   By: Markus Daft M.D.   On: 08/20/2020 15:15   IR Fluoro Guide CV Line Left  Result Date: 08/20/2020 INDICATION: 65 year old with end-stage renal disease, hematochezia, anemia and poor venous access. Patient needs a central venous catheter. EXAM: PLACEMENT OF CENTRAL LINE WITH ULTRASOUND AND FLUOROSCOPIC GUIDANCE CENTRAL VENOGRAM COMPARISON:  None. MEDICATIONS: None ANESTHESIA/SEDATION: None FLUOROSCOPY TIME:  3 minutes, 48 seconds, 95.6 mGy) COMPLICATIONS: None immediate. PROCEDURE: The procedure, risks, benefits, and alternatives were explained to the patient. Questions regarding the procedure were encouraged and answered. The patient understands and consents to the procedure. Right side of the neck was evaluated with ultrasound. Ultrasound confirmed a patent right internal jugular vein. Ultrasound image was saved for documentation. Right side of the neck was prepped and draped in sterile fashion. Maximal barrier sterile technique was utilized including caps, mask, sterile gowns, sterile gloves, sterile drape, hand hygiene and skin antiseptic. Skin was anesthetized with 1% lidocaine. 21 gauge needle was directed into the right internal jugular vein but a wire would not advance centrally. Micropuncture dilator set was placed. Contrast was injected for a venogram. Total  occlusion of the lower right internal jugular vein was identified. Microcatheter was removed with manual compression. Dressing was placed.  Ultrasound confirmed a patent left internal jugular vein. Ultrasound image was saved for documentation. Left side of the neck was prepped and draped in sterile fashion. Maximal barrier sterile technique was utilized including caps, mask, sterile gowns, sterile gloves, sterile drape, hand hygiene and skin antiseptic. Skin was anesthetized using 1% lidocaine. Small incision was made. 21 gauge needle was directed into the left internal jugular vein with ultrasound guidance. Micropuncture dilator set was placed. Wire would not easily advance into the SVC due to the right innominate stent. Venogram was performed to confirm patency. Kumpe catheter and Glidewire were used to cannulate the SVC from the left jugular approach. A Platinum Plus wire was placed and the catheter was removed. Peel-away sheath was placed. A dual lumen Power PICC line was cut to 23 cm and advanced over the wire into the SVC. Wire was removed. Both lumens aspirated and flushed well. Catheter was sutured to the skin. Dressing was placed. 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 Wire would not advance into the SVC from right jugular approach. 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 Central venogram from the left jugular approach confirmed patency of the left innominate vein but there is significant narrowing at the junction of the left innominate vein and SVC related to the right innominate vein stent. Eventually a Kumpe catheter and Glidewire were able to cannulate the SVC from a left jugular approach. The central venous catheter was positioned near the superior cavoatrial junction. IMPRESSION: Successful placement of a central venous catheter using ultrasound and fluoroscopic guidance.  Catheter was placed from a left jugular approach. 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 Right jugular vein is patent in the neck but the lower portion of the right jugular vein is occluded from the right innominate vein stent. Patient is not a candidate for future right jugular central venous access. Electronically Signed   By: Markus Daft M.D.   On: 08/20/2020 15:15   IR US Guide Vasc Access Left  Result Date: 08/20/2020 INDICATION: 65 year old with end-stage renal disease, hematochezia, anemia and poor venous access. Patient needs a central venous catheter. EXAM: PLACEMENT OF CENTRAL LINE WITH ULTRASOUND AND FLUOROSCOPIC GUIDANCE CENTRAL VENOGRAM COMPARISON:  None. MEDICATIONS: None ANESTHESIA/SEDATION: None FLUOROSCOPY TIME:  3 minutes, 48 seconds, 75.4 mGy) COMPLICATIONS: None immediate. PROCEDURE: The procedure, risks, benefits, and alternatives were explained to the patient. Questions regarding the procedure were encouraged and answered. The patient understands and consents to the procedure. Right side of the neck was evaluated with ultrasound. Ultrasound confirmed a patent right internal jugular vein. Ultrasound image was saved for documentation. Right side of the neck was prepped and draped in sterile fashion. Maximal barrier sterile technique was utilized including caps, mask, sterile gowns, sterile gloves, sterile drape, hand hygiene and skin antiseptic. Skin was anesthetized with 1% lidocaine. 21 gauge needle was directed into the right internal jugular vein but a wire would not advance centrally. Micropuncture dilator set was placed. Contrast was injected for a venogram. 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 Microcatheter was removed with manual compression. Dressing was placed. Ultrasound confirmed a patent left internal jugular vein. Ultrasound image was saved for documentation.  Left side of the neck was prepped and draped in sterile fashion. Maximal barrier sterile technique was utilized including caps, mask, sterile gowns,  sterile gloves, sterile drape, hand hygiene and skin antiseptic. Skin was anesthetized using 1% lidocaine. Small incision was made. 21 gauge needle was directed into the left internal jugular vein with ultrasound guidance. Micropuncture dilator set was placed. Wire would not easily advance into the SVC due to the right innominate stent. Venogram was performed to confirm patency. Kumpe catheter and Glidewire were used to cannulate the SVC from the left jugular approach. A Platinum Plus wire was placed and the catheter was removed. Peel-away sheath was placed. A dual lumen Power PICC line was cut to 23 cm and advanced over the wire into the SVC. Wire was removed. Both lumens aspirated and flushed well. Catheter was sutured to the skin. Dressing was placed. 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 Wire would not advance into the SVC from right jugular approach. 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 Central venogram from the left jugular approach confirmed patency of the left innominate vein but there is significant narrowing at the junction of the left innominate vein and SVC related to the right innominate vein stent. Eventually a Kumpe catheter and Glidewire were able to cannulate the SVC from a left jugular approach. The central venous catheter was positioned near the superior cavoatrial junction. IMPRESSION: Successful placement of a central venous catheter using ultrasound and fluoroscopic guidance. Catheter was placed from a left jugular approach. 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 Right jugular vein is patent in the neck but the lower portion of the  right jugular vein is occluded from the right innominate vein stent. Patient is not a candidate for future right jugular central venous access. Electronically Signed   By: Markus Daft M.D.   On: 08/20/2020 15:15   IR US Guide Vasc Access Right  Result Date: 08/20/2020 INDICATION: 65 year old with end-stage renal disease, hematochezia, anemia and poor venous access. Patient needs a central venous catheter. EXAM: PLACEMENT OF CENTRAL LINE WITH ULTRASOUND AND FLUOROSCOPIC GUIDANCE CENTRAL VENOGRAM COMPARISON:  None. MEDICATIONS: None ANESTHESIA/SEDATION: None FLUOROSCOPY TIME:  3 minutes, 48 seconds, 62.3 mGy) COMPLICATIONS: None immediate. PROCEDURE: The procedure, risks, benefits, and alternatives were explained to the patient. Questions regarding the procedure were encouraged and answered. The patient understands and consents to the procedure. Right side of the neck was evaluated with ultrasound. Ultrasound confirmed a patent right internal jugular vein. Ultrasound image was saved for documentation. Right side of the neck was prepped and draped in sterile fashion. Maximal barrier sterile technique was utilized including caps, mask, sterile gowns, sterile gloves, sterile drape, hand hygiene and skin antiseptic. Skin was anesthetized with 1% lidocaine. 21 gauge needle was directed into the right internal jugular vein but a wire would not advance centrally. Micropuncture dilator set was placed. Contrast was injected for a venogram. 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 Microcatheter was removed with manual compression. Dressing was placed. Ultrasound confirmed a patent left internal jugular vein. Ultrasound image was saved for documentation. Left side of the neck was prepped and draped in sterile fashion. Maximal barrier sterile technique was utilized including caps, mask, sterile gowns, sterile gloves, sterile drape, hand hygiene and skin antiseptic. Skin was anesthetized using 1%  lidocaine. Small incision was made. 21 gauge needle was directed into the left internal jugular vein with ultrasound guidance. Micropuncture dilator set was  placed. Wire would not easily advance into the SVC due to the right innominate stent. Venogram was performed to confirm patency. Kumpe catheter and Glidewire were used to cannulate the SVC from the left jugular approach. A Platinum Plus wire was placed and the catheter was removed. Peel-away sheath was placed. A dual lumen Power PICC line was cut to 23 cm and advanced over the wire into the SVC. Wire was removed. Both lumens aspirated and flushed well. Catheter was sutured to the skin. Dressing was placed. 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 Wire would not advance into the SVC from right jugular approach. 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 Central venogram from the left jugular approach confirmed patency of the left innominate vein but there is significant narrowing at the junction of the left innominate vein and SVC related to the right innominate vein stent. Eventually a Kumpe catheter and Glidewire were able to cannulate the SVC from a left jugular approach. The central venous catheter was positioned near the superior cavoatrial junction. IMPRESSION: Successful placement of a central venous catheter using ultrasound and fluoroscopic guidance. Catheter was placed from a left jugular approach. 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 Right jugular vein is patent in the neck but the lower portion of the right jugular vein is occluded from the right innominate vein stent. Patient is not a candidate for future right jugular central venous access. Electronically Signed   By: Markus Daft M.D.   On: 08/20/2020 15:15   DG Chest Port 1 View  Result  Date: 08/22/2020 CLINICAL DATA:  Chest EXAM: PORTABLE CHEST 1 VIEW COMPARISON:  Earlier same day FINDINGS: No new consolidation or edema. No pleural effusion. No pneumothorax. Stable cardiomediastinal contours with cardiomegaly. Right vascular stent. Left IJ central line tip overlies cavoatrial junction. IMPRESSION: No acute process in the chest. Electronically Signed   By: Macy Mis M.D.   On: 08/22/2020 15:07   DG CHEST PORT 1 VIEW  Result Date: 08/22/2020 CLINICAL DATA:  Hypoxia EXAM: PORTABLE CHEST 1 VIEW COMPARISON:  August 19, 2020 FINDINGS: Endotracheal tube tip is 3.4 cm above the carina. Central catheter tip is at the cavoatrial junction. No pneumothorax. The lungs are clear. Heart is mildly enlarged with pulmonary vascularity normal. No adenopathy. There is a right innominate/subclavian region stent, unchanged in position. IMPRESSION: Tube and catheter positions as described without pneumothorax. No edema or airspace opacity. Stable cardiac prominence. Electronically Signed   By: Lowella Grip III M.D.   On: 08/22/2020 11:03   CT Angio Abd/Pel w/ and/or w/o  Result Date: 08/21/2020 CLINICAL DATA:  Melena, gastrointestinal bleeding EXAM: CTA ABDOMEN AND PELVIS WITHOUT AND WITH CONTRAST TECHNIQUE: Multidetector CT imaging of the abdomen and pelvis was performed using the standard protocol during bolus administration of intravenous contrast. Multiplanar reconstructed images and MIPs were obtained and reviewed to evaluate the vascular anatomy. CONTRAST:  132mL OMNIPAQUE IOHEXOL 350 MG/ML SOLN COMPARISON:  03/27/2018 FINDINGS: VASCULAR Aorta: Normal caliber aorta without aneurysm, dissection, vasculitis or significant stenosis. Diffuse atherosclerosis. Celiac: Patent without evidence of aneurysm, dissection, vasculitis or significant stenosis. SMA: The SMA is patent at its origin without focal stenosis, aneurysm, or dissection. Distal branches of the SMA supplying the bowel in the right lower  quadrant demonstrates significant atheromatous plaque and multifocal high-grade stenoses. Renals: There  is high-grade stenosis at the origin of the left renal artery. Moderate stenosis is seen within the origin of the right renal artery. No dissection, aneurysm, or fibromuscular dysplasia. IMA: Patent without evidence of aneurysm, dissection, vasculitis or significant stenosis. Significant atherosclerosis. Inflow: There is significant atheromatous plaque within the bilateral common iliac, external iliac, and internal iliac arteries. No high-grade stenosis. Proximal Outflow: There is extensive atherosclerosis throughout the visualized portions of the common femoral and superficial femoral arteries. No high-grade stenosis. Veins: No obvious venous abnormality within the limitations of this arterial phase study. Review of the MIP images confirms the above findings. NON-VASCULAR Lower chest: No acute pleural or parenchymal lung disease. Scattered emphysematous changes are noted. Hepatobiliary: Numerous cysts are seen throughout the liver. No other focal abnormalities. The gallbladder is unremarkable. Pancreas: Unremarkable. No pancreatic ductal dilatation or surrounding inflammatory changes. Spleen: Normal in size without focal abnormality. Adrenals/Urinary Tract: Extensive bilateral renal cortical atrophy with diffuse cystic changes consistent with history of end-stage renal disease. Adrenals are unremarkable. The bladder is decompressed. Stomach/Bowel: No bowel obstruction or ileus. No bowel wall thickening or inflammatory change. No evidence of contrast accumulation within the bowel lumen to suggest active gastrointestinal hemorrhage. Lymphatic: No pathologic adenopathy. Reproductive: Surgical clips are seen within the prostate bed. Other: No free fluid or free gas.  No abdominal wall hernia. Musculoskeletal: Diffuse changes of renal osteodystrophy. No acute bony abnormalities. Reconstructed images demonstrate no  additional findings. IMPRESSION: VASCULAR 1. No evidence of active gastrointestinal hemorrhage. 2. Extensive atherosclerosis of the aorta and its branches. Focal areas of high-grade stenosis within distal branches of the SMA in the right lower quadrant. High-grade bilateral renal artery stenosis, left greater than right. NON-VASCULAR 1. Numerous bilateral hepatic cysts unchanged. 2. Bilateral renal cortical atrophy with diffuse cystic change, consistent with known end-stage renal disease. 3. No acute intra-abdominal or intrapelvic process. 4. Aortic Atherosclerosis (ICD10-I70.0) and Emphysema (ICD10-J43.9). Electronically Signed   By: Randa Ngo M.D.   On: 08/21/2020 00:02   DG ESOPHAGUS W SINGLE CM (SOL OR THIN BA)  Result Date: 08/22/2020 CLINICAL DATA:  Chest pain following recent colonoscopy with esophageal intubation. Evaluate for esophageal leak. Water-soluble study requested. EXAM: ESOPHOGRAM/BARIUM SWALLOW TECHNIQUE: Single contrast examination was performed using Omnipaque 300 by mouth. FLUOROSCOPY TIME:  Fluoroscopy Time: 1 minutes and 18 seconds of low-dose pulsed fluoroscopy Radiation Exposure Index (if provided by the fluoroscopic device): 15.4 mGy Number of Acquired Spot Images: 1 COMPARISON:  Chest radiographs same date.  Chest CT 06/17/2018. FINDINGS: The patient has somewhat limited mobility. These emanation was performed in the AP semi erect and RPO supine positions. The patient swallowed the contrast without difficulty. There is mild esophageal dysmotility. No evidence of esophageal leak, mass or ulceration. There is no laryngeal penetration or aspiration. Right subclavian/brachiocephalic vascular stent and left IJ central venous catheter are noted. IMPRESSION: No evidence of esophageal leak or other injury. Mild esophageal dysmotility. Electronically Signed   By: Richardean Sale M.D.   On: 08/22/2020 16:12

## 2020-08-24 ENCOUNTER — Encounter (HOSPITAL_COMMUNITY)
Admission: EM | Discharge status: Against Medical Advice | Payer: Self-pay | Source: Home / Self Care | Attending: Internal Medicine

## 2020-08-24 ENCOUNTER — Encounter (HOSPITAL_COMMUNITY): Payer: Self-pay | Admitting: General Surgery

## 2020-08-24 ENCOUNTER — Encounter (HOSPITAL_COMMUNITY): Payer: Self-pay | Admitting: Certified Registered"

## 2020-08-24 ENCOUNTER — Inpatient Hospital Stay (HOSPITAL_COMMUNITY): Payer: Medicare HMO

## 2020-08-24 DIAGNOSIS — R578 Other shock: Secondary | ICD-10-CM

## 2020-08-24 DIAGNOSIS — J9601 Acute respiratory failure with hypoxia: Secondary | ICD-10-CM

## 2020-08-24 DIAGNOSIS — N186 End stage renal disease: Secondary | ICD-10-CM

## 2020-08-24 DIAGNOSIS — K2970 Gastritis, unspecified, without bleeding: Secondary | ICD-10-CM | POA: Diagnosis not present

## 2020-08-24 DIAGNOSIS — D62 Acute posthemorrhagic anemia: Secondary | ICD-10-CM | POA: Diagnosis not present

## 2020-08-24 DIAGNOSIS — I8510 Secondary esophageal varices without bleeding: Secondary | ICD-10-CM

## 2020-08-24 DIAGNOSIS — F19120 Other psychoactive substance abuse with intoxication, uncomplicated: Secondary | ICD-10-CM | POA: Insufficient documentation

## 2020-08-24 HISTORY — PX: ESOPHAGOGASTRODUODENOSCOPY: SHX5428

## 2020-08-24 LAB — POCT I-STAT 7, (LYTES, BLD GAS, ICA,H+H)
Acid-Base Excess: 4 mmol/L — ABNORMAL HIGH (ref 0.0–2.0)
Acid-Base Excess: 3 mmol/L — ABNORMAL HIGH (ref 0.0–2.0)
Acid-Base Excess: 2 mmol/L (ref 0.0–2.0)
Bicarbonate: 24.7 mmol/L (ref 20.0–28.0)
Bicarbonate: 26.6 mmol/L (ref 20.0–28.0)
Bicarbonate: 25.9 mmol/L (ref 20.0–28.0)
Calcium, Ion: 0.99 mmol/L — ABNORMAL LOW (ref 1.15–1.40)
Calcium, Ion: 1.02 mmol/L — ABNORMAL LOW (ref 1.15–1.40)
Calcium, Ion: 1.12 mmol/L — ABNORMAL LOW (ref 1.15–1.40)
HCT: 26 % — ABNORMAL LOW (ref 39.0–52.0)
HCT: 26 % — ABNORMAL LOW (ref 39.0–52.0)
HCT: 26 % — ABNORMAL LOW (ref 39.0–52.0)
Hemoglobin: 8.8 g/dL — ABNORMAL LOW (ref 13.0–17.0)
Hemoglobin: 8.8 g/dL — ABNORMAL LOW (ref 13.0–17.0)
Hemoglobin: 8.8 g/dL — ABNORMAL LOW (ref 13.0–17.0)
O2 Saturation: 100 %
O2 Saturation: 100 %
O2 Saturation: 100 %
Patient temperature: 98.2
Patient temperature: 98.2
Potassium: 4.1 mmol/L (ref 3.5–5.1)
Potassium: 4.2 mmol/L (ref 3.5–5.1)
Potassium: 4.3 mmol/L (ref 3.5–5.1)
Sodium: 136 mmol/L (ref 135–145)
Sodium: 136 mmol/L (ref 135–145)
Sodium: 135 mmol/L (ref 135–145)
TCO2: 25 mmol/L (ref 22–32)
TCO2: 28 mmol/L (ref 22–32)
TCO2: 27 mmol/L (ref 22–32)
pCO2 arterial: 24 mmHg — ABNORMAL LOW (ref 32.0–48.0)
pCO2 arterial: 33.1 mmHg (ref 32.0–48.0)
pCO2 arterial: 34.3 mmHg (ref 32.0–48.0)
pH, Arterial: 7.62 (ref 7.350–7.450)
pH, Arterial: 7.511 — ABNORMAL HIGH (ref 7.350–7.450)
pH, Arterial: 7.486 — ABNORMAL HIGH (ref 7.350–7.450)
pO2, Arterial: 154 mmHg — ABNORMAL HIGH (ref 83.0–108.0)
pO2, Arterial: 191 mmHg — ABNORMAL HIGH (ref 83.0–108.0)
pO2, Arterial: 171 mmHg — ABNORMAL HIGH (ref 83.0–108.0)

## 2020-08-24 LAB — BPAM FFP
Blood Product Expiration Date: 202204022359
Blood Product Expiration Date: 202204022359
Blood Product Expiration Date: 202204022359
Blood Product Expiration Date: 202204022359
Blood Product Expiration Date: 202204032359
Blood Product Expiration Date: 202204032359
Blood Product Expiration Date: 202204032359
Blood Product Expiration Date: 202204032359
Blood Product Expiration Date: 202204032359
Blood Product Expiration Date: 202204032359
Blood Product Expiration Date: 202204032359
Blood Product Expiration Date: 202204032359
Blood Product Expiration Date: 202204032359
Blood Product Expiration Date: 202204032359
Blood Product Expiration Date: 202204032359
Blood Product Expiration Date: 202204032359
Blood Product Expiration Date: 202204172359
Blood Product Expiration Date: 202204172359
Blood Product Expiration Date: 202204182359
Blood Product Expiration Date: 202204182359
ISSUE DATE / TIME: 202203291546
ISSUE DATE / TIME: 202203291546
ISSUE DATE / TIME: 202203291546
ISSUE DATE / TIME: 202203291546
ISSUE DATE / TIME: 202203291602
ISSUE DATE / TIME: 202203291602
ISSUE DATE / TIME: 202203291602
ISSUE DATE / TIME: 202203291602
ISSUE DATE / TIME: 202203300842
ISSUE DATE / TIME: 202203300842
ISSUE DATE / TIME: 202203300842
ISSUE DATE / TIME: 202203300842
ISSUE DATE / TIME: 202203300842
ISSUE DATE / TIME: 202203300842
ISSUE DATE / TIME: 202203300842
ISSUE DATE / TIME: 202203300842
ISSUE DATE / TIME: 202203300842
ISSUE DATE / TIME: 202203300842
ISSUE DATE / TIME: 202203301401
ISSUE DATE / TIME: 202203301401
Unit Type and Rh: 5100
Unit Type and Rh: 5100
Unit Type and Rh: 5100
Unit Type and Rh: 5100
Unit Type and Rh: 5100
Unit Type and Rh: 5100
Unit Type and Rh: 5100
Unit Type and Rh: 5100
Unit Type and Rh: 5100
Unit Type and Rh: 5100
Unit Type and Rh: 5100
Unit Type and Rh: 6200
Unit Type and Rh: 6200
Unit Type and Rh: 6200
Unit Type and Rh: 6200
Unit Type and Rh: 6200
Unit Type and Rh: 6200
Unit Type and Rh: 6200
Unit Type and Rh: 6200
Unit Type and Rh: 9500

## 2020-08-24 LAB — TYPE AND SCREEN
ABO/RH(D): O POS
Antibody Screen: POSITIVE
DAT, IgG: NEGATIVE
Donor AG Type: NEGATIVE
Donor AG Type: NEGATIVE
Donor AG Type: NEGATIVE
Donor AG Type: NEGATIVE
Donor AG Type: NEGATIVE
Donor AG Type: NEGATIVE
Donor AG Type: NEGATIVE
Donor AG Type: NEGATIVE
Donor AG Type: NEGATIVE
Unit division: 0
Unit division: 0
Unit division: 0
Unit division: 0
Unit division: 0
Unit division: 0
Unit division: 0
Unit division: 0
Unit division: 0
Unit division: 0
Unit division: 0
Unit division: 0
Unit division: 0
Unit division: 0
Unit division: 0
Unit division: 0
Unit division: 0

## 2020-08-24 LAB — BPAM RBC
Blood Product Expiration Date: 202204102359
Blood Product Expiration Date: 202204142359
Blood Product Expiration Date: 202204192359
Blood Product Expiration Date: 202204192359
Blood Product Expiration Date: 202204212359
Blood Product Expiration Date: 202204242359
Blood Product Expiration Date: 202204262359
Blood Product Expiration Date: 202204262359
Blood Product Expiration Date: 202204262359
Blood Product Expiration Date: 202204262359
Blood Product Expiration Date: 202204272359
Blood Product Expiration Date: 202204272359
Blood Product Expiration Date: 202204272359
Blood Product Expiration Date: 202204272359
Blood Product Expiration Date: 202205032359
Blood Product Expiration Date: 202205042359
Blood Product Expiration Date: 202205042359
ISSUE DATE / TIME: 202203261215
ISSUE DATE / TIME: 202203261607
ISSUE DATE / TIME: 202203271056
ISSUE DATE / TIME: 202203290603
ISSUE DATE / TIME: 202203291525
ISSUE DATE / TIME: 202203291541
ISSUE DATE / TIME: 202203291541
ISSUE DATE / TIME: 202203291541
ISSUE DATE / TIME: 202203291541
ISSUE DATE / TIME: 202203291602
ISSUE DATE / TIME: 202203291602
ISSUE DATE / TIME: 202203291602
ISSUE DATE / TIME: 202203291602
ISSUE DATE / TIME: 202203300031
ISSUE DATE / TIME: 202203300309
ISSUE DATE / TIME: 202203301124
ISSUE DATE / TIME: 202203301136
Unit Type and Rh: 5100
Unit Type and Rh: 5100
Unit Type and Rh: 5100
Unit Type and Rh: 5100
Unit Type and Rh: 5100
Unit Type and Rh: 5100
Unit Type and Rh: 5100
Unit Type and Rh: 5100
Unit Type and Rh: 9500
Unit Type and Rh: 9500
Unit Type and Rh: 9500
Unit Type and Rh: 9500
Unit Type and Rh: 9500
Unit Type and Rh: 9500
Unit Type and Rh: 9500
Unit Type and Rh: 9500
Unit Type and Rh: 9500

## 2020-08-24 LAB — PREPARE FRESH FROZEN PLASMA
Unit division: 0
Unit division: 0
Unit division: 0
Unit division: 0
Unit division: 0
Unit division: 0
Unit division: 0
Unit division: 0
Unit division: 0
Unit division: 0

## 2020-08-24 LAB — PREPARE PLATELET PHERESIS
Unit division: 0
Unit division: 0

## 2020-08-24 LAB — PREPARE CRYOPRECIPITATE: Unit division: 0

## 2020-08-24 LAB — COMPREHENSIVE METABOLIC PANEL
ALT: 332 U/L — ABNORMAL HIGH (ref 0–44)
AST: 456 U/L — ABNORMAL HIGH (ref 15–41)
Albumin: 2.6 g/dL — ABNORMAL LOW (ref 3.5–5.0)
Alkaline Phosphatase: 47 U/L (ref 38–126)
Anion gap: 13 (ref 5–15)
BUN: 34 mg/dL — ABNORMAL HIGH (ref 8–23)
CO2: 22 mmol/L (ref 22–32)
Calcium: 8.1 mg/dL — ABNORMAL LOW (ref 8.9–10.3)
Chloride: 100 mmol/L (ref 98–111)
Creatinine, Ser: 7.1 mg/dL — ABNORMAL HIGH (ref 0.61–1.24)
GFR, Estimated: 8 mL/min — ABNORMAL LOW (ref >60)
Glucose, Bld: 118 mg/dL — ABNORMAL HIGH (ref 70–99)
Potassium: 4.1 mmol/L (ref 3.5–5.1)
Sodium: 135 mmol/L (ref 135–145)
Total Bilirubin: 1 mg/dL (ref 0.3–1.2)
Total Protein: 4.8 g/dL — ABNORMAL LOW (ref 6.5–8.1)

## 2020-08-24 LAB — BPAM CRYOPRECIPITATE
Blood Product Expiration Date: 202203292155
ISSUE DATE / TIME: 202203291712
Unit Type and Rh: 5100

## 2020-08-24 LAB — CBC
HCT: 26.7 % — ABNORMAL LOW (ref 39.0–52.0)
Hemoglobin: 9.6 g/dL — ABNORMAL LOW (ref 13.0–17.0)
MCH: 30.3 pg (ref 26.0–34.0)
MCHC: 36 g/dL (ref 30.0–36.0)
MCV: 84.2 fL (ref 80.0–100.0)
Platelets: 102 10*3/uL — ABNORMAL LOW (ref 150–400)
RBC: 3.17 MIL/uL — ABNORMAL LOW (ref 4.22–5.81)
RDW: 14.4 % (ref 11.5–15.5)
WBC: 9.6 10*3/uL (ref 4.0–10.5)
nRBC: 0.7 % — ABNORMAL HIGH (ref 0.0–0.2)

## 2020-08-24 LAB — BPAM PLATELET PHERESIS
Blood Product Expiration Date: 202203292359
Blood Product Expiration Date: 202203312359
ISSUE DATE / TIME: 202203291552
Unit Type and Rh: 6200
Unit Type and Rh: 6200

## 2020-08-24 LAB — PATHOLOGIST SMEAR REVIEW

## 2020-08-24 LAB — LACTIC ACID, PLASMA: Lactic Acid, Venous: 1.8 mmol/L (ref 0.5–1.9)

## 2020-08-24 LAB — TRAUMA TEG PANEL
CFF Max Amplitude: 23.8 mm (ref 15–32)
Citrated Kaolin (R): 6.1 min (ref 4.6–9.1)
Citrated Rapid TEG (MA): 58.2 mm (ref 52–70)
Lysis at 30 Minutes: 0 % (ref 0.0–2.6)

## 2020-08-24 LAB — GLUCOSE, CAPILLARY
Glucose-Capillary: 112 mg/dL — ABNORMAL HIGH (ref 70–99)
Glucose-Capillary: 125 mg/dL — ABNORMAL HIGH (ref 70–99)
Glucose-Capillary: 127 mg/dL — ABNORMAL HIGH (ref 70–99)
Glucose-Capillary: 133 mg/dL — ABNORMAL HIGH (ref 70–99)
Glucose-Capillary: 145 mg/dL — ABNORMAL HIGH (ref 70–99)

## 2020-08-24 LAB — TRIGLYCERIDES: Triglycerides: 47 mg/dL (ref <150)

## 2020-08-24 LAB — HEMOGLOBIN AND HEMATOCRIT, BLOOD
HCT: 26.2 % — ABNORMAL LOW (ref 39.0–52.0)
HCT: 26.2 % — ABNORMAL LOW (ref 39.0–52.0)
HCT: 26.8 % — ABNORMAL LOW (ref 39.0–52.0)
Hemoglobin: 9.2 g/dL — ABNORMAL LOW (ref 13.0–17.0)
Hemoglobin: 9.3 g/dL — ABNORMAL LOW (ref 13.0–17.0)
Hemoglobin: 9.7 g/dL — ABNORMAL LOW (ref 13.0–17.0)

## 2020-08-24 LAB — MAGNESIUM: Magnesium: 1.6 mg/dL — ABNORMAL LOW (ref 1.7–2.4)

## 2020-08-24 LAB — PHOSPHORUS: Phosphorus: 4.9 mg/dL — ABNORMAL HIGH (ref 2.5–4.6)

## 2020-08-24 SURGERY — EGD (ESOPHAGOGASTRODUODENOSCOPY)
Anesthesia: Monitor Anesthesia Care

## 2020-08-24 MED ORDER — VASOPRESSIN 20 UNITS/100 ML INFUSION FOR SHOCK
0.0000 [IU]/min | INTRAVENOUS | Status: DC
  Administered 2020-08-24: 0.03 [IU]/min via INTRAVENOUS
  Filled 2020-08-24 (×2): qty 100

## 2020-08-24 MED ORDER — SODIUM CHLORIDE 0.9% FLUSH
10.0000 mL | Freq: Two times a day (BID) | INTRAVENOUS | Status: DC
  Administered 2020-08-24 – 2020-08-30 (×11): 10 mL
  Administered 2020-08-30: 20 mL
  Administered 2020-08-31: 10 mL

## 2020-08-24 MED ORDER — DIPHENHYDRAMINE HCL 50 MG/ML IJ SOLN
INTRAMUSCULAR | Status: AC
  Filled 2020-08-24: qty 1

## 2020-08-24 MED ORDER — MAGNESIUM SULFATE 50 % IJ SOLN
3.0000 g | Freq: Once | INTRAVENOUS | Status: AC
  Administered 2020-08-24: 3 g via INTRAVENOUS
  Filled 2020-08-24: qty 6

## 2020-08-24 MED ORDER — FENTANYL CITRATE (PF) 100 MCG/2ML IJ SOLN
INTRAMUSCULAR | Status: AC
  Filled 2020-08-24: qty 2

## 2020-08-24 MED ORDER — MIDAZOLAM HCL (PF) 5 MG/ML IJ SOLN
INTRAMUSCULAR | Status: AC
  Filled 2020-08-24: qty 2

## 2020-08-24 MED ORDER — CALCIUM GLUCONATE-NACL 1-0.675 GM/50ML-% IV SOLN
1.0000 g | Freq: Once | INTRAVENOUS | Status: AC
  Administered 2020-08-24: 1000 mg via INTRAVENOUS
  Filled 2020-08-24: qty 50

## 2020-08-24 MED ORDER — SODIUM CHLORIDE 0.9% FLUSH: 10.0000 mL | INTRAVENOUS | Status: DC | PRN

## 2020-08-24 NOTE — Progress Notes (Signed)
Initial Nutrition Assessment  DOCUMENTATION CODES:   Non-severe (moderate) malnutrition in context of chronic illness  INTERVENTION:   - Recommend initiation of TPN - pt with moderate malnutrition and minimal PO intake since admission (4 days)  NUTRITION DIAGNOSIS:   Moderate Malnutrition related to chronic illness (ESRD on home HD, CHF) as evidenced by moderate fat depletion,severe muscle depletion.  GOAL:   Patient will meet greater than or equal to 90% of their needs  MONITOR:   Vent status,Labs,Weight trends,I & O's,Skin  REASON FOR ASSESSMENT:   Ventilator    ASSESSMENT:   65 year old male who presented to the ED on 3/25 with chest pain and SOB. PMH of ESRD on home HD, CAD, CHF, PVD, adenocarcinoma of the bladder s/p TURBT, anemia, thrombocytopenia, PE, hepatitis C.   3/28 - colonoscopy (findings of polyp and diverticulosis, no active bleeding), intubated during procedure due to flash pulmonary edema, later extubated 3/29 - hemorrhagic shock secondary to splenic rupture, transferred to ICU, intubated, s/p ex-lap with splenectomy and application of abdominal wound VAC  CT scan on 3/29 showed significant hemoperitoneum and evidence of splenic rupture. Pt taken to OR for emergent laparotomy. Massive Blood Transfusion Protocol was initiated. Pt's abdomen remains open.  Pt with OG tube in place, currently to low intermittent suction with brown output in tubing and cannister.  Discussed pt with RN and during ICU rounds. Plan is for endoscopy today at bedside and possible return to the OR tomorrow.  Pt has mostly been NPO or on clear liquids since admission with brief period on renal diet on 3/26 from 0101 to 0855 and on 3/29 from 1758 to 3/29 at 0708. No meal completions charted.  Spoke with pt's two daughters at bedside. They report that PTA pt had a fair appetite. Pt did not eat full meals but rather snacked throughout the day. If pt did sit down to a meal, he may consume  half a cup of rice and 2-3 chicken nuggets. Pt snacked on items like chips.  Pt's daughters report that pt has always been on the smaller side and report that pt has other family members who are also smaller. They have not noticed that pt has experienced any significant recent weight changes.  Given malnutrition and minimal PO intake since admission (4 days), recommend initiation of TPN.  EDW: 46.5 kg Admit weight: 49.1 kg Current weight: 51.5 kg  Pt with facial edema.  Patient is currently intubated on ventilator support MV: 8.1 L/min Temp (24hrs), Avg:97.1 F (36.2 C), Min:90.1 F (32.3 C), Max:98.78 F (37.1 C) BP (a-line): 125/53 MAP (a-line): 72  Drips: Fentanyl Levophed  Medications reviewed and include: colace, IV protonix, miralax, IV acetaminophen, IV magnesium sulfate 3 grams once, IV abx  Labs reviewed: ionized calcium 1.02, phosphorous 4.9, magnesium 1.6, elevated LFTs, hemoglobin 8.8 CBG's: 112-133  NGT output: 200 ml x 12 hours VAC: 1425 ml x 24 hours I/O's: +9.9 L since admit  NUTRITION - FOCUSED PHYSICAL EXAM:  Flowsheet Row Most Recent Value  Orbital Region Mild depletion  Upper Arm Region Moderate depletion  Thoracic and Lumbar Region Moderate depletion  Buccal Region Unable to assess  Temple Region Moderate depletion  Clavicle Bone Region Moderate depletion  Clavicle and Acromion Bone Region Moderate depletion  Scapular Bone Region Unable to assess  Dorsal Hand No depletion  Patellar Region Moderate depletion  Anterior Thigh Region Severe depletion  Posterior Calf Region Severe depletion  Edema (RD Assessment) Mild  Hair Reviewed  Eyes Unable to assess  Mouth Unable to assess  Skin Reviewed  Nails Reviewed       Diet Order:   Diet Order            Diet NPO time specified  Diet effective now                 EDUCATION NEEDS:   No education needs have been identified at this time  Skin:  Skin Assessment: Skin Integrity  Issues: Wound VAC: abdomen  Last BM:  08/21/20  Height:   Ht Readings from Last 1 Encounters:  08/20/20 5\' 4"  (1.626 m)    Weight:   Wt Readings from Last 1 Encounters:  08/24/20 51.5 kg    BMI:  Body mass index is 19.49 kg/m.  Estimated Nutritional Needs:   Kcal:  1500  Protein:  80-95 grams  Fluid:  >/= 1.5 L    Gustavus Bryant, MS, RD, LDN Inpatient Clinical Dietitian Please see AMiON for contact information.

## 2020-08-24 NOTE — Progress Notes (Signed)
[facility] Progress Note   Subjective:  Patient became unstable yesterday afternoon with splenic rupture requiring him to go to the OR emergently.  Now on norepinephrine but previously requiring epinephrine as well.  Ventilated at this time.   Objective Vitals:   08/24/20 0645 08/24/20 0700 08/24/20 0715 08/24/20 0716  BP:  (!) 85/59 (!) 85/59   Pulse: 83 (!) 103 83   Resp: (!) 24 (!) 24 (!) 24   Temp: 98.42 F (36.9 C) 98.42 F (36.9 C) 98.24 F (36.8 C)   TempSrc:      SpO2: 98% 100% 100% 100%  Weight:      Height:         Additional Objective Labs: Basic Metabolic Panel: Recent Labs  Lab 08/23/20 0300 08/23/20 1637 08/23/20 2040 08/24/20 0323 08/24/20 0342 08/24/20 0730  NA 130*   < > 138 135 136 136  K 4.4   < > 3.7 4.1 4.1 4.2  CL 95*  --  103 100  --   --   CO2 21*  --  21* 22  --   --   GLUCOSE 88  --  207* 118*  --   --   BUN 47*  --  32* 34*  --   --   CREATININE 9.81*  --  6.52* 7.10*  --   --   CALCIUM 9.2  --  8.1* 8.1*  --   --   PHOS  --   --   --  4.9*  --   --    < > = values in this interval not displayed.   CBC: Recent Labs  Lab 08/23/20 1020 08/23/20 1331 08/23/20 1454 08/23/20 1809 08/23/20 2035 08/23/20 2040 08/23/20 2355 08/24/20 0323 08/24/20 0342 08/24/20 0730  WBC 5.8 4.5  --  9.2  --  2.4*  --  9.6  --   --   NEUTROABS  --  3.1  --   --   --   --   --   --   --   --   HGB 8.0* 4.6*   < > 10.5*   < > 9.7*   < > 9.6* 8.8* 8.8*  HCT 22.9* 14.1*   < > 29.1*   < > 27.5*   < > 26.7* 26.0* 26.0*  MCV 90.9 95.3  --  85.3  --  85.9  --  84.2  --   --   PLT 130* 103*  --  84*  --  78*  78*  --  102*  --   --    < > = values in this interval not displayed.   Blood Culture    Component Value Date/Time   SDES BLOOD SITE NOT SPECIFIED 08/20/2020 0404   SPECREQUEST AEROBIC BOTTLE ONLY Blood Culture adequate volume 08/20/2020 0404   CULT  08/20/2020 0404    NO GROWTH 4 DAYS Performed at [facility], [place] [address] [place], [place] [address]    REPTSTATUS PENDING 08/20/2020 0404     Physical Exam General: Non-toxic appearing, nad  Heart: normal rate Lungs: bilateral chest rise, no iwob Abdomen: soft non-distended  Extremities: No LE edema  Dialysis Access: RUE AVF +bruit   Medications: . sodium chloride 10 mL/hr at 08/24/20 0700  . acetaminophen Stopped (08/24/20 0519)  . epinephrine 0 mcg/min (08/23/20 2105)  . fentaNYL infusion INTRAVENOUS 300 mcg/hr (08/24/20 0908)  . magnesium sulfate bolus IVPB    . norepinephrine (LEVOPHED)  Adult infusion 12 mcg/min (08/24/20 0700)  . piperacillin-tazobactam (ZOSYN)  IV Stopped (08/24/20 0432)  . vancomycin     . sodium chloride   Intravenous Once  . sodium chloride   Intravenous Once  . chlorhexidine gluconate (MEDLINE KIT)  15 mL Mouth Rinse BID  . Chlorhexidine Gluconate Cloth  6 each Topical Q0600  . docusate  100 mg Per Tube BID  . mouth rinse  15 mL Mouth Rinse 10 times per day  . pantoprazole  40 mg Intravenous Q12H  . polyethylene glycol  17 g Per Tube Daily  . sodium chloride flush  10-40 mL Intracatheter Q12H    Dialysis Orders:  Nx Stage MTTF EDW 46.5kg   Assessment/Plan: 1. Splenic rupture: Went to the OR on 3/29.  Surgery assisting.  Anemia seems to be stabilizing 2. Melena/GIB/hemorrhagic shock-status post transfusions for possible diverticular bleed but now with massive bleeding related to splenic rupture.  Transfusions per primary. 3. ESRD -  on home dialysis maintaining MWF schedule here.  No acute need for dialysis.  Will assess tomorrow to determine whether he can tolerate dialysis as he improves.  Could consider delaying dialysis further if labs look okay. 4. Anemia --transfusions and hemorrhage as above.  Received Aranesp 60 with HD 3/26.  5. Hypertension/volume  - no BP meds while on pressors 6. Metabolic bone disease -  Continue Auryxia, calcitriol, Sensipar when taking po.  7. Oral abscess - now off Augmentin;  oral hygiene     [place] 08/24/2020,9:43 AM

## 2020-08-24 NOTE — Progress Notes (Signed)
[facility] Progress Note Patient Name: Logan Martin DOB: 08-Aug-1955 MRN: [id number]   Date of Service  08/24/2020  HPI/Events of Note  ABG on 40%/PRVC 32/TV 350/P 5 = 7.62/24.0/154. PRVC rate already decreased to 24 by RT.  eICU Interventions  Plan: 1. Repeat ABG at 7:30 AM.     Intervention Category Major Interventions: Acid-Base disturbance - evaluation and management;Respiratory failure - evaluation and management  Dalynn Jhaveri Eugene 08/24/2020, 4:05 AM

## 2020-08-24 NOTE — Progress Notes (Signed)
NAME:  Logan Martin, MRN:  [id number], DOB:  1955-09-16, LOS: 4 ADMISSION DATE:  08/19/2020, CONSULTATION DATE:  08/23/2020 REFERRING MD:  Dr. Waldron Labs, CHIEF COMPLAINT:  hypotension  History of Present Illness:  65 year old male with prior history of ESRD, HTN, HLD, CAD, history of adenocarcinoma of the bladder, anemia, thrombocytopenia, hx of PE, hepatitis C presenting on 3/25 with progressive fatigue and generalized weakness with dark stools for 1 week.   Work-up significant for anemia and melanotic stools.  CTA abdomen pelvis showed no active source of GI bleeding.  It did note focal areas of high-grade stenosis within distal branches of the SMA in the right lower quadrant. Due to poor IV access, a central line was placed by IR.  Nephrology and [facility] consulted.  Underwent endoscopy on 3/28 which showed diverticulosis, 2 polyps were removed and sent for biopsy but showed no evidence of GI bleed.  Patient has required ongoing transfusion throughout hospitalization for ongoing anemia of unclear source.  On 3/29 patient acutely complained of worsening abdominal and chronic back pain, shortness of breath, was diaphoretic, and progressively hypotensive with SBP into the  40's.  Also noted to be hypothermic with rectal temp not reading.  Rapid response initiated.  A small fluid bolus started and then Levaquin started.  X-ray and KUB ordered.  Surgery and PCCM consulted urgently.  Patient remains awake although very hard of hearing complaining of diffuse abdominal pain and trying to use the bedpan with no results.    Addendum: on chart review, patient recently traveling.  Had iHD out of town, possibly Wednesday and was not able to get blood returned back.  Hgb of 7.9 on admit.    Pertinent  Medical History  ESRD (on home HD MTTF), HTN, HLD, CAD, history of adenocarcinoma of the bladder, anemia, thrombocytopenia, hx of PE, hepatitis C  Hard of hearing   Significant Hospital Events: Including  procedures, antibiotic start and stop dates in addition to other pertinent events   . 3/25 admitted to [facility], nephrology following  . 3/26 L IJ DL CVL placed by IR (of note, unable to pass via right IJ due to occlusion and lower right IJ at venous stent) . 3/27 [facility] consulted . 3/28 colonoscopy by Dr. Lyndel Safe with two polyps removed, required intubation during procedure for flash pulmonary edema; extubated post procedure . 3/29 transfused 4th unit PRBC this am.  During afternoon, acute decompensation/ hypotension on floor -> RRT, vasopresors started, stat CT abd/ pelvis and surgical consult -> intubated, then to OR with ruptured spleen s/p MTP and splenectomy  Interim History / Subjective:  Remains on NE 12 mcg/min, fentanyl 200 mcg/min Hgb trend stable 9.7- 9.7- 9.6- 8.8- 8.8 overnight Wound vac output 1.4L  Vent adjusted for resp alk afebrile  Objective   Blood pressure (!) 85/59, pulse 83, temperature 98.24 F (36.8 C), resp. rate (!) 24, height _0  (1.626 m), weight 51.5 kg, SpO2 100 %. CVP:  [7 mmHg-8 mmHg] 8 mmHg  Vent Mode: PRVC FiO2 (%):  [40 %-100 %] 40 % Set Rate:  [24 bmp-32 bmp] 24 bmp Vt Set:  [350 mL-470 mL] 350 mL PEEP:  [5 cmH20] 5 cmH20 Plateau Pressure:  [13 cmH20-17 cmH20] 14 cmH20   Intake/Output Summary (Last 24 hours) at 08/24/2020 0756 Last data filed at 08/24/2020 0700 Gross per 24 hour  Intake 6907.64 ml  Output 4125 ml  Net 2782.64 ml   Filed Weights   08/22/20 0328 08/23/20 0300 08/24/20 0500  Weight: 50.3 kg 49.4 kg 51.5 kg   Examination: General:  Critically ill adult male sedated on MV HEENT: MM pale/moist, ETT/ OGT, pinpoint pupils, moderate scleral edema Neuro: will grimace with stimulation otherwise sedated, withdrawals in extremities CV: ST, AVF RUE +b/t PULM:  Non labored on MV, not breathing over set rate, lungs clear throughout GI: flat, wound vac in place with serosanguinous drainage Extremities: warm/dry, no LE edema, no foley Skin:  no rashes  +2.7L/ net +9.9 200 ml out of OGT 1.4 L out of wound vac overnight  Labs/imaging that I havepersonally reviewed  (right click and "Reselect all SmartList Selections" daily)  3/25 SARS/ flu >> neg 3/26 BCx >>  3/25 amoxicillin >> 3/26 3/29 zosyn >>  3/29 vanc >>  3/26 L IJ CVL>> 3/29 ETT >> 3/29 R femoral Aline >> 3/29 L femoral cordis >>  3/26 CTA abd/ pelvis >> VASCULAR 1. No evidence of active gastrointestinal hemorrhage. 2. Extensive atherosclerosis of the aorta and its branches. Focal areas of high-grade stenosis within distal branches of the SMA in the right lower quadrant. High-grade bilateral renal artery stenosis, left greater than right NON-VASCULAR 1. Numerous bilateral hepatic cysts unchanged. 2. Bilateral renal cortical atrophy with diffuse cystic change, consistent with known end-stage renal disease. 3. No acute intra-abdominal or intrapelvic process. 4. Aortic Atherosclerosis and Emphysema   3/28 colonoscopy >> One 6 mm polyp in the proximal transverse colon, removed with a cold snare. Resected and retrieved.  One 6 mm polyp in the mid descending colon, removed with a cold snare. Resected and retrieved.  Diverticulosis in the sigmoid colon and in the ascending colon.  Non-bleeding internal hemorrhoids.  3/29 CTA a/p >> acute splenic rupture with large hemoperitoneum and moderate subscapsular splenic hematoma  Resolved Hospital Problem list    Assessment & Plan:   Shock- hemorraghic secondary to splenic rupture and hemoperitoneum s/p ex lap and splenectomy 3/29 s/p MTP  - Surgery following, appreciate excellent care - abd remains open, plans to take back to OR ? 3/31 - monitor wound vac output - trending H/H, coags, transfuse for Hgb < 7 - continue NE for MAP goal > 65 - trend CVP  - lactate reassuring this am at 1.8 - vanc/ zosyn per pharmacy  - trend CBC/ CMET  ABLA/ symptomatic anemia - had iHD out town last week, was unable to get blood  back  - acute decompensation 3/29 2/2 to splenic rupture, prior to that no evidence of active bleed causing symptomatic anemia by CTA a/p or colonoscopy - s/p MTP 3/29 (9 PRBC, 6 FFP, 1 platelet, 1 cryo ) w/ premedication given known antibodies; had 4 PRBC prior to MTP since admission P:  - Trend H/H q6hr - transfuse for Hgb < 7 - monitor coags/ TEG  Acute hypoxic respiratory failure  -Continue MV support, 6cc/kg IBW with goal Pplat <30 and DP<15  -VAP prevention protocol/ PPI -PAD protocol for sedation> fentanyl gtt/ versed, RASS goal -4/-5 with open abd -wean FiO2 as able for SpO2 >92%  - rate reduced to 20 due to ongoing resp alk on ABG, recheck ABG in 1hr  ESRD on HD - Nephrology following  - still no current metabolic derangements for CRRT, but suspect he will need at some point given his hemodynamics, ongoing vasopressor support  - trend renal indices   Hypomag 1.6 iCa 1.02 - will replete with Mag 3gm  - 1gm calcium gluconate now   Best practice (right click and "Reselect all SmartList Selections"  daily)  Diet:  NPO Pain/Anxiety/Delirium protocol (if indicated): Yes (RASS goal -4,-5) VAP protocol (if indicated): Yes DVT prophylaxis: Contraindicated, SCDs GI prophylaxis: PPI BID Glucose control:  SSI Yes Central venous access:  Yes, and it is still needed Arterial line:  Yes, and it is still needed Foley:  N/A anuric at baseline Mobility:  bed rest  PT consulted: N/A Last date of multidisciplinary goals of care discussion [ongoing] Code Status:  full code Disposition: ICU  Family updated extensively 3/29; pending for 3/30  Labs   CBC: Recent Labs  Lab 08/23/20 1020 08/23/20 1331 08/23/20 1454 08/23/20 1809 08/23/20 2035 08/23/20 2040 08/23/20 2355 08/24/20 0323 08/24/20 0342 08/24/20 0730  WBC 5.8 4.5  --  9.2  --  2.4*  --  9.6  --   --   NEUTROABS  --  3.1  --   --   --   --   --   --   --   --   HGB 8.0* 4.6*   < > 10.5*   < > 9.7* 9.7* 9.6* 8.8*  8.8*  HCT 22.9* 14.1*   < > 29.1*   < > 27.5* 26.2* 26.7* 26.0* 26.0*  MCV 90.9 95.3  --  85.3  --  85.9  --  84.2  --   --   PLT 130* 103*  --  84*  --  78*  78*  --  102*  --   --    < > = values in this interval not displayed.    Basic Metabolic Panel: Recent Labs  Lab 08/19/20 1916 08/20/20 0404 08/21/20 0409 08/22/20 0500 08/23/20 0300 08/23/20 1637 08/23/20 1750 08/23/20 2040 08/24/20 0323 08/24/20 0342 08/24/20 0730  NA  --    < > 135 131* 130*   < > 138 138 135 136 136  K  --    < > 3.7 4.1 4.4   < > 4.0 3.7 4.1 4.1 4.2  CL  --    < > 97* 94* 95*  --   --  103 100  --   --   CO2  --    < > 28 26 21*  --   --  21* 22  --   --   GLUCOSE  --    < > 80 78 88  --   --  207* 118*  --   --   BUN  --    < > 34* 38* 47*  --   --  32* 34*  --   --   CREATININE  --    < > 5.82* 7.98* 9.81*  --   --  6.52* 7.10*  --   --   CALCIUM  --    < > 9.2 9.3 9.2  --   --  8.1* 8.1*  --   --   MG 1.9  --   --   --   --   --   --   --  1.6*  --   --   PHOS  --   --   --   --   --   --   --   --  4.9*  --   --    < > = values in this interval not displayed.   GFR: Estimated Creatinine Clearance: 7.7 mL/min (A) (by C-G formula based on SCr of 7.1 mg/dL (H)). Recent Labs  Lab 08/20/20 0404 08/21/20 0409 08/23/20 1331 08/23/20 1400 08/23/20  1809 08/23/20 2040 08/24/20 0323  WBC 4.8   < > 4.5  --  9.2 2.4* 9.6  LATICACIDVEN 1.6  --   --  6.3* 6.3*  --  1.8   < > = values in this interval not displayed.    Liver Function Tests: Recent Labs  Lab 08/20/20 0404 08/21/20 0409 08/23/20 1454 08/24/20 0323  AST _0 456*  ALT _1 332*  ALKPHOS 74 69 76 47  BILITOT 1.1 1.0 0.8 1.0  PROT 6.2* 5.5* 5.8* 4.8*  ALBUMIN 3.4* 3.0* 3.0* 2.6*   Recent Labs  Lab 08/23/20 1331  LIPASE 26   No results for input(s): AMMONIA in the last 168 hours.  ABG    Component Value Date/Time   PHART 7.511 (H) 08/24/2020 0730   PCO2ART 33.1 08/24/2020 0730   PO2ART 191 (H) 08/24/2020  0730   HCO3 26.6 08/24/2020 0730   TCO2 28 08/24/2020 0730   ACIDBASEDEF 5.0 (H) 08/23/2020 1750   O2SAT 100.0 08/24/2020 0730     Coagulation Profile: Recent Labs  Lab 08/19/20 1855 08/23/20 1454 08/23/20 2035 08/23/20 2040  INR 1.1 1.5* 1.4* 1.4*    Cardiac Enzymes: No results for input(s): CKTOTAL, CKMB, CKMBINDEX, TROPONINI in the last 168 hours.  HbA1C: No results found for: HGBA1C  CBG: Recent Labs  Lab 08/23/20 1934 08/23/20 2354 08/24/20 0327  GLUCAP 132* 133* 112*     Critical care time: 79 mins     Kennieth Rad, ACNP [facility] 08/24/2020, 7:56 AM   See Amion for pager If no response to pager, please call PCCM pager  After 7:00 pm call Elink

## 2020-08-24 NOTE — Op Note (Addendum)
[facility] Patient Name: Logan Martin Procedure Date : 08/24/2020 MRN: [id number] Attending MD: Jackquline Denmark , MD Date of Birth: [birth year] CSN: [id number] Age: 65 Admit Type: Inpatient Procedure:                Upper GI endoscopy Indications:              Coffee-ground emesis Providers:                Jackquline Denmark, MD, Ladona Ridgel, Technician, Clyde Lundborg RN Referring MD:              Medicines:                Monitored Anesthesia Care Complications:            No immediate complications. Estimated Blood Loss:     Estimated blood loss: none. Procedure:                Pre-Anesthesia Assessment:                           - Prior to the procedure, a History and Physical                            was performed, and patient medications and                            allergies were reviewed. The patient's tolerance of                            previous anesthesia was also reviewed. The risks                            and benefits of the procedure and the sedation                            options and risks were discussed with the patient.                            All questions were answered, and informed consent                            was obtained. Prior Anticoagulants: The patient has                            taken no previous anticoagulant or antiplatelet                            agents. ASA Grade Assessment: IV - A patient with                            severe systemic disease that is a constant threat  to life. After reviewing the risks and benefits,                            the patient was deemed in satisfactory condition to                            undergo the procedure.                           After obtaining informed consent, the endoscope was                            passed under direct vision. Throughout the                            procedure, the patient's blood pressure, pulse,  and                            oxygen saturations were monitored continuously. The                            GIF-H190 ([id number]) Olympus gastroscope was                            introduced through the mouth, and advanced to the                            second part of duodenum. The upper GI endoscopy was                            accomplished without difficulty. The patient                            tolerated the procedure well. Scope In: Scope Out: Findings:      Grade I varices were found in the middle third of the esophagus and in       the lower third of the esophagus. They were 4 mm in largest diameter.      A small hiatal hernia was present with healing Mallory-Weiss tear. No       active bleeding.      Scattered mild inflammation characterized by erosions with superficial       ulcers without bleeding was found in the entire examined stomach. Part       d/t NG tube and part d/t portal hypertensive gastropathy. No GAVE      The examined duodenum was normal. There was bile in the second portion       of the duodenum. Impression:               - Grade I esophageal varices.                           - Small hiatal hernia with healing Mallory-Weiss                            tear. No active bleeding.                           -  Gastritis vs portal hypertensive gastritis.                           - No specimens collected. Moderate Sedation:      Moderate (conscious) sedation was administered by the endoscopy nurse       and supervised by the endoscopist. [Parameters Monitored]. [Sedation       Duration Time]. Recommendation:           - Continue present medications.                           - Would continue IV Protonix.                           - The findings and recommendations were discussed                            with the patient's family. Procedure Code(s):        --- Professional ---                           [phone], Esophagogastroduodenoscopy, flexible,                             transoral; diagnostic, including collection of                            specimen(s) by brushing or washing, when performed                            (separate procedure) Diagnosis Code(s):        --- Professional ---                           I85.00, Esophageal varices without bleeding                           K44.9, Diaphragmatic hernia without obstruction or                            gangrene                           K29.70, Gastritis, unspecified, without bleeding                           K92.0, Hematemesis CPT copyright 2019 [employer]. All rights reserved. The codes documented in this report are preliminary and upon coder review may  be revised to meet current compliance requirements. Jackquline Denmark, MD 08/24/2020 4:18:06 PM This report has been signed electronically. Number of Addenda: 0

## 2020-08-24 NOTE — Progress Notes (Signed)
[facility] Progress Note  1 Day Post-Op  Subjective: CC:  Intubated, sedated.  His two daughters are at bedside.  He is on pressor support with levo and epi Hgb/hct stable,9.6/26, has not required additional transfusion this morning.  INR 1.4 Cr 7.10, nephrology  Following   Objective: Vital signs in last 24 hours: Temp:  [90.1 F (32.3 C)-98.78 F (37.1 C)] 96.98 F (36.1 C) (03/30 1200) Pulse Rate:  [78-125] 80 (03/30 1200) Resp:  [20-34] 20 (03/30 1200) BP: (51-161)/(17-78) 128/53 (03/30 1127) SpO2:  [81 %-100 %] 100 % (03/30 1200) Arterial Line BP: (71-182)/(33-80) 123/51 (03/30 1200) FiO2 (%):  [40 %-100 %] 40 % (03/30 1128) Weight:  [51.5 kg] 51.5 kg (03/30 0500) Last BM Date: 08/21/20  Intake/Output from previous day: 03/29 0701 - 03/30 0700 In: 6907.6 [I.V.:2668.5; Blood:3739.3; IV Piggyback:499.9] Out: 4125 [Emesis/NG output:200; Drains:1425; Blood:2500] Intake/Output this shift: Total I/O In: 570.5 [I.V.:414.5; IV Piggyback:156] Out: 400 [Drains:400]  PE: Gen:  Intubated, sedated, ill appearing male Card:  Regular rate and rhythm, no LE edema Pulm:  Ventilated respirations clear to auscultation bilaterally Abd: firm, abthera NPWT in place holding suction - SS drainage in cannister (1400cc/24h) Skin: warm and dry, no rashes; AVF to RUE for HD access Psych: A&Ox3   Lab Results:  Recent Labs    08/23/20 2040 08/23/20 2355 08/24/20 0323 08/24/20 0342 08/24/20 1010 08/24/20 1216  WBC 2.4*  --  9.6  --   --   --   HGB 9.7*   < > 9.6*   < > 8.8* 9.2*  HCT 27.5*   < > 26.7*   < > 26.0* 26.2*  PLT 78*  78*  --  102*  --   --   --    < > = values in this interval not displayed.   BMET Recent Labs    08/23/20 2040 08/24/20 0323 08/24/20 0342 08/24/20 0730 08/24/20 1010  NA 138 135   < > 136 135  K 3.7 4.1   < > 4.2 4.3  CL 103 100  --   --   --   CO2 21* 22  --   --   --   GLUCOSE 207* 118*  --   --   --   BUN 32* 34*  --   --    --   CREATININE 6.52* 7.10*  --   --   --   CALCIUM 8.1* 8.1*  --   --   --    < > = values in this interval not displayed.   PT/INR Recent Labs    08/23/20 2035 08/23/20 2040  LABPROT 16.5* 16.9*  INR 1.4* 1.4*   CMP     Component Value Date/Time   NA 135 08/24/2020 1010   K 4.3 08/24/2020 1010   CL 100 08/24/2020 0323   CO2 22 08/24/2020 0323   GLUCOSE 118 (H) 08/24/2020 0323   BUN 34 (H) 08/24/2020 0323   CREATININE 7.10 (H) 08/24/2020 0323   CREATININE 5.98 (H) 03/28/2017 1638   CALCIUM 8.1 (L) 08/24/2020 0323   PROT 4.8 (L) 08/24/2020 0323   ALBUMIN 2.6 (L) 08/24/2020 0323   AST 456 (H) 08/24/2020 0323   ALT 332 (H) 08/24/2020 0323   ALKPHOS 47 08/24/2020 0323   BILITOT 1.0 08/24/2020 0323   GFRNONAA 8 (L) 08/24/2020 0323   GFRAA 9 (L) 03/04/2019 0840   Lipase     Component Value Date/Time   LIPASE 26 08/23/2020 1331  Studies/Results: CT CHEST ABDOMEN PELVIS W CONTRAST  Result Date: 08/23/2020 CLINICAL DATA:  Inpatient with acute generalized abdominal pain. End-stage renal disease on dialysis for over 6 months. EXAM: CT CHEST, ABDOMEN, AND PELVIS WITH CONTRAST TECHNIQUE: Multidetector CT imaging of the chest, abdomen and pelvis was performed following the standard protocol during bolus administration of intravenous contrast. CONTRAST:  153mL OMNIPAQUE IOHEXOL 300 MG/ML  SOLN COMPARISON:  08/20/2020 CT angiogram of the abdomen and pelvis. 06/17/2018 chest CT angiogram. FINDINGS: CT CHEST FINDINGS Cardiovascular: Normal heart size. Small to moderate pericardial effusion. Three-vessel coronary atherosclerosis. Left internal jugular central venous catheter terminates at the cavoatrial junction. Venous stent in place in right subclavian and right brachiocephalic veins. Atherosclerotic nonaneurysmal thoracic aorta. Top-normal caliber main pulmonary artery (3.3 cm diameter). No central pulmonary emboli. Mediastinum/Nodes: No discrete thyroid nodules. Patulous thoracic  esophagus with layering oral contrast. No pathologically enlarged axillary, mediastinal or hilar lymph nodes. Lungs/Pleura: No pneumothorax. No right pleural effusion. Trace dependent left pleural effusion. Moderate centrilobular emphysema with mild diffuse bronchial wall thickening. A few scattered tiny solid right pulmonary nodules, largest 2 mm in peripheral right upper lobe (series 5/image 71), not appreciably changed since 11/13/2017 chest CT, considered benign. No acute consolidative airspace disease, lung masses or new significant pulmonary nodules. Musculoskeletal: No aggressive appearing focal osseous lesions. Mild diffuse sclerosis in the thoracic skeleton. Mild bilateral gynecomastia, asymmetric to the right. CT ABDOMEN PELVIS FINDINGS Hepatobiliary: Normal liver size. Several scattered simple liver cysts largest 2.4 cm in the inferior right liver. Innumerable subcentimeter hypodense lesions scattered throughout the liver, too small to characterize, not appreciably changed. Vicarious excretion of contrast in the nondistended gallbladder. No gallbladder wall thickening. No biliary ductal dilatation. Pancreas: Normal, with no mass or duct dilation. Spleen: There is an acute subcapsular splenic hematoma measuring 2.0 cm thickness (series 3/image 63). Overall normal spleen size. No discrete splenic mass. Adrenals/Urinary Tract: Normal adrenals. Severe atrophy of the kidneys bilaterally, unchanged. Chronic moderate right hydroureteronephrosis is unchanged. No left hydronephrosis. Numerous simple bilateral renal cysts, largest 2.2 cm in the lower right kidney. Numerous subcentimeter hypodense renal cortical lesions in both kidneys, too small to characterize, not appreciably changed. Bladder appears decompressed. Stomach/Bowel: Small hiatal hernia. Stomach is otherwise nondistended and normal. Normal caliber small bowel with no small bowel wall thickening. Oral contrast transits to the right colon. Appendix not  discretely visualized. Normal large bowel with no diverticulosis, large bowel wall thickening or pericolonic fat stranding. Vascular/Lymphatic: Atherosclerotic nonaneurysmal abdominal aorta. Patent portal, splenic and diminutive renal veins. No pathologically enlarged lymph nodes in the abdomen or pelvis. Reproductive: Multiple surgical clips from apparent prostatectomy. Other: Large volume of hemoperitoneum, new since scan from 3 days prior. No pneumoperitoneum. Musculoskeletal: No aggressive appearing focal osseous lesions. Diffuse sclerosis in the lumbar spine and bilateral pelvic girdle, unchanged. Mild lumbar spondylosis. IMPRESSION: 1. Findings suggestive of acute splenic rupture with acute large volume hemoperitoneum and moderate subcapsular splenic hematoma, all new since CT scan from 3 days prior. 2. Small to moderate pericardial effusion. Trace dependent left pleural effusion. 3. Three-vessel coronary atherosclerosis. 4. Small hiatal hernia. Patulous thoracic esophagus with layering oral contrast, suggesting esophageal dysmotility and/or gastroesophageal reflux. 5. Chronic moderate right hydroureteronephrosis. Severe atrophy of the kidneys bilaterally. 6. Renal osteodystrophy. 7. Aortic Atherosclerosis (ICD10-I70.0) and Emphysema (ICD10-J43.9). Critical Value/emergent results were called by telephone at the time of interpretation on 08/23/2020 at 3:20 pm to provider DAWOOD [facility] , who verbally acknowledged these results. Electronically Signed   By: Ilona Sorrel  M.D.   On: 08/23/2020 15:23   DG Chest Port 1 View  Result Date: 08/24/2020 CLINICAL DATA:  Acute respiratory failure EXAM: PORTABLE CHEST 1 VIEW COMPARISON:  08/23/2020 FINDINGS: Endotracheal tube seen 5 cm above the carina. Nasogastric tube extends into the upper abdomen. Left internal jugular central venous catheter tip noted within the right atrium. Vascular stents are seen within the expected right subclavian and innominate vein. The  lungs are symmetrically well expanded. Developing retrocardiac opacity may represent partial left lower lobe collapse. Tiny left pleural effusion. No pneumothorax. Cardiac size is within normal limits. Surgical clips are seen in the left axilla. IMPRESSION: Stable support lines and tubes. Partial left lower lobe collapse.  Tiny left pleural effusion. Electronically Signed   By: Fidela Salisbury MD   On: 08/24/2020 06:02   DG CHEST PORT 1 VIEW  Result Date: 08/23/2020 CLINICAL DATA:  Check endotracheal tube placement EXAM: PORTABLE CHEST 1 VIEW COMPARISON:  08/23/2020 FINDINGS: Left jugular central line is again noted with the catheter tip at the cavoatrial junction. Right subclavian and innominate vein stenting is seen. Endotracheal tube and gastric catheter are noted. The lungs are clear. Cardiac shadow is within normal limits. IMPRESSION: Tubes and lines as described above. No acute intrathoracic abnormality noted. Electronically Signed   By: Inez Catalina M.D.   On: 08/23/2020 19:10   DG Chest Port 1 View  Result Date: 08/23/2020 CLINICAL DATA:  Shortness of breath. EXAM: PORTABLE CHEST 1 VIEW COMPARISON:  Single-view of the chest 08/22/2020. FINDINGS: Left IJ central venous catheter and vascular stent along the right subclavian vessels are unchanged. Lungs are clear. No pneumothorax or pleural effusion. Heart size is normal. Aortic atherosclerosis noted. IMPRESSION: No acute disease. Aortic Atherosclerosis (ICD10-I70.0). Electronically Signed   By: Inge Rise M.D.   On: 08/23/2020 14:26   DG Chest Port 1 View  Result Date: 08/22/2020 CLINICAL DATA:  Chest EXAM: PORTABLE CHEST 1 VIEW COMPARISON:  Earlier same day FINDINGS: No new consolidation or edema. No pleural effusion. No pneumothorax. Stable cardiomediastinal contours with cardiomegaly. Right vascular stent. Left IJ central line tip overlies cavoatrial junction. IMPRESSION: No acute process in the chest. Electronically Signed   By: Macy Mis M.D.   On: 08/22/2020 15:07   DG Abd Portable 1V  Result Date: 08/23/2020 CLINICAL DATA:  Abdominal pain. EXAM: PORTABLE ABDOMEN - 1 VIEW COMPARISON:  March 10, 2012. FINDINGS: The bowel gas pattern is normal. Residual contrast is seen throughout the small bowel. Surgical staples are noted in the pelvis. No radio-opaque calculi or other significant radiographic abnormality are seen. IMPRESSION: No evidence of bowel obstruction or ileus. Electronically Signed   By: Marijo Conception M.D.   On: 08/23/2020 14:26   DG OR LOCAL ABDOMEN  Result Date: 08/23/2020 CLINICAL DATA:  Recent exploratory laparotomy EXAM: OR LOCAL ABDOMEN COMPARISON:  CT from earlier in the same day. FINDINGS: Gastric catheter is noted within the stomach. Scattered contrast is noted throughout the bowel consistent with the recent CT examination. By carious excretion of contrast is noted within the gallbladder. Left femoral sheath is noted. Laparotomy pads are noted in the left upper quadrant. By clinical history, these were left on purpose for packing. IMPRESSION: Postsurgical changes with laparotomy pads used as packing in the left upper quadrant. No other focal abnormality is noted. Electronically Signed   By: Inez Catalina M.D.   On: 08/23/2020 19:09   DG ESOPHAGUS W SINGLE CM (SOL OR THIN BA)  Result Date: 08/22/2020  CLINICAL DATA:  Chest pain following recent colonoscopy with esophageal intubation. Evaluate for esophageal leak. Water-soluble study requested. EXAM: ESOPHOGRAM/BARIUM SWALLOW TECHNIQUE: Single contrast examination was performed using Omnipaque 300 by mouth. FLUOROSCOPY TIME:  Fluoroscopy Time: 1 minutes and 18 seconds of low-dose pulsed fluoroscopy Radiation Exposure Index (if provided by the fluoroscopic device): 15.4 mGy Number of Acquired Spot Images: 1 COMPARISON:  Chest radiographs same date.  Chest CT 06/17/2018. FINDINGS: The patient has somewhat limited mobility. These emanation was performed in the AP  semi erect and RPO supine positions. The patient swallowed the contrast without difficulty. There is mild esophageal dysmotility. No evidence of esophageal leak, mass or ulceration. There is no laryngeal penetration or aspiration. Right subclavian/brachiocephalic vascular stent and left IJ central venous catheter are noted. IMPRESSION: No evidence of esophageal leak or other injury. Mild esophageal dysmotility. Electronically Signed   By: Richardean Sale M.D.   On: 08/22/2020 16:12    Anti-infectives: Anti-infectives (From admission, onward)   Start     Dose/Rate Route Frequency Ordered Stop   08/24/20 1200  vancomycin (VANCOREADY) IVPB 500 mg/100 mL  Status:  Discontinued        500 mg 100 mL/hr over 60 Minutes Intravenous Every M-W-F (Hemodialysis) 08/23/20 1724 08/23/20 2039   08/24/20 1200  vancomycin (VANCOREADY) IVPB 500 mg/100 mL        500 mg 100 mL/hr over 60 Minutes Intravenous Every M-W-F (Hemodialysis) 08/23/20 2052     08/23/20 2145  vancomycin (VANCOREADY) IVPB 1000 mg/200 mL  Status:  Discontinued        1,000 mg 200 mL/hr over 60 Minutes Intravenous  Once 08/23/20 2053 08/23/20 2055   08/23/20 2100  piperacillin-tazobactam (ZOSYN) IVPB 2.25 g        2.25 g 100 mL/hr over 30 Minutes Intravenous Every 8 hours 08/23/20 2052     08/23/20 1730  vancomycin (VANCOREADY) IVPB 1000 mg/200 mL  Status:  Discontinued        1,000 mg 200 mL/hr over 60 Minutes Intravenous  Once 08/23/20 1723 08/23/20 2101   08/23/20 1730  piperacillin-tazobactam (ZOSYN) IVPB 2.25 g  Status:  Discontinued        2.25 g 100 mL/hr over 30 Minutes Intravenous Every 8 hours 08/23/20 1726 08/23/20 2039   08/23/20 1430  piperacillin-tazobactam (ZOSYN) IVPB 3.375 g  Status:  Discontinued        3.375 g 12.5 mL/hr over 240 Minutes Intravenous Every 12 hours 08/23/20 1427 08/23/20 1726   08/20/20 0415  amoxicillin-clavulanate (AUGMENTIN) 500-125 MG per tablet 500 mg  Status:  Discontinued        1 tablet Oral  Daily 08/20/20 0329 08/21/20 1258   08/19/20 2345  Ampicillin-Sulbactam (UNASYN) 3 g in sodium chloride 0.9 % 100 mL IVPB  Status:  Discontinued        3 g 200 mL/hr over 30 Minutes Intravenous  Once 08/19/20 2333 08/20/20 0329     Assessment/Plan Assessment/Plan ESRD on HD HTN HLD CAD PVD 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 Lovena Neighbours), 08/11/2018 Cystoprostatectomy + resection bladder augmentation + enterolysis ([facility], Dr. Zenia Resides) [place] with mesh (02/2019 Dr. Kieth Brightly)  thrombocytopenia PMH PE Hepatitis C   Acute on chronic anemia - transfuse as needed per primary.  GI Bleed requiring transfusion - CTA abdomen pelvis 3/28 with no evidence of GIB - s/p colonoscopy 3/28 Dr. Lyndel Safe; two colon polyps removed, diverticulosis noted, no bleeding; required intubated for procedure due to flash pulm edema - initial intubation  attempt was in the esophagus. Esophogram negative for esophageal injury/leak. - no source of GIB has been identified thus far, GI planning bedside supper endoscopy today   Hemorrhagic shock due to splenic rupture  S/p exploratory laparotomy, splenectomy, application of abthera wound VAC 3/29 Dr. Greer Pickerel - POD#1 - will follow results of EGD, at this point no source of initial GIB has been identified - colonoscopy was negative, CTA negative.  - plan to take back to OR tomorrow for second look laparotomy, possible abdominal closure. - management of shock per CCM     LOS: 4 days    Obie Dredge, [facility] Please see Amion for pager number during day hours 7:00am-4:30pm

## 2020-08-24 NOTE — Progress Notes (Signed)
eLink Physician-Brief Progress Note Patient Name: Logan Martin DOB: 21-Mar-1956 MRN: [id number]   Date of Service  08/24/2020  HPI/Events of Note  Lactic Acid = 6.3 - BP = 114/50 with MAP = 69 by A-line. Hgb = 9.7.  eICU Interventions  Plan: Repeat Lactic Acid at 4 AM.     Intervention Category Major Interventions: Acid-Base disturbance - evaluation and management  Jerae Izard Eugene 08/24/2020, 1:57 AM

## 2020-08-25 ENCOUNTER — Encounter (HOSPITAL_COMMUNITY): Payer: Self-pay | Admitting: Internal Medicine

## 2020-08-25 ENCOUNTER — Encounter (HOSPITAL_COMMUNITY)
Admission: EM | Discharge status: Against Medical Advice | Payer: Self-pay | Source: Home / Self Care | Attending: Internal Medicine

## 2020-08-25 ENCOUNTER — Inpatient Hospital Stay (HOSPITAL_COMMUNITY): Payer: Medicare HMO | Admitting: Certified Registered Nurse Anesthetist

## 2020-08-25 ENCOUNTER — Inpatient Hospital Stay (HOSPITAL_COMMUNITY): Payer: Medicare HMO

## 2020-08-25 DIAGNOSIS — R578 Other shock: Secondary | ICD-10-CM | POA: Diagnosis not present

## 2020-08-25 DIAGNOSIS — Z992 Dependence on renal dialysis: Secondary | ICD-10-CM

## 2020-08-25 DIAGNOSIS — J9601 Acute respiratory failure with hypoxia: Secondary | ICD-10-CM | POA: Diagnosis not present

## 2020-08-25 DIAGNOSIS — D62 Acute posthemorrhagic anemia: Secondary | ICD-10-CM | POA: Diagnosis not present

## 2020-08-25 DIAGNOSIS — N186 End stage renal disease: Secondary | ICD-10-CM | POA: Diagnosis not present

## 2020-08-25 HISTORY — PX: LAPAROTOMY: SHX154

## 2020-08-25 LAB — CBC
HCT: 24.9 % — ABNORMAL LOW (ref 39.0–52.0)
HCT: 24.6 % — ABNORMAL LOW (ref 39.0–52.0)
Hemoglobin: 8.5 g/dL — ABNORMAL LOW (ref 13.0–17.0)
Hemoglobin: 8.4 g/dL — ABNORMAL LOW (ref 13.0–17.0)
MCH: 30.6 pg (ref 26.0–34.0)
MCH: 30.8 pg (ref 26.0–34.0)
MCHC: 34.1 g/dL (ref 30.0–36.0)
MCHC: 34.1 g/dL (ref 30.0–36.0)
MCV: 89.6 fL (ref 80.0–100.0)
MCV: 90.1 fL (ref 80.0–100.0)
Platelets: 128 10*3/uL — ABNORMAL LOW (ref 150–400)
Platelets: 139 10*3/uL — ABNORMAL LOW (ref 150–400)
RBC: 2.78 MIL/uL — ABNORMAL LOW (ref 4.22–5.81)
RBC: 2.73 MIL/uL — ABNORMAL LOW (ref 4.22–5.81)
RDW: 16.2 % — ABNORMAL HIGH (ref 11.5–15.5)
RDW: 16.1 % — ABNORMAL HIGH (ref 11.5–15.5)
WBC: 18.1 10*3/uL — ABNORMAL HIGH (ref 4.0–10.5)
WBC: 16 10*3/uL — ABNORMAL HIGH (ref 4.0–10.5)
nRBC: 2.1 % — ABNORMAL HIGH (ref 0.0–0.2)
nRBC: 3.1 % — ABNORMAL HIGH (ref 0.0–0.2)

## 2020-08-25 LAB — RENAL FUNCTION PANEL
Albumin: 2.4 g/dL — ABNORMAL LOW (ref 3.5–5.0)
Anion gap: 12 (ref 5–15)
BUN: 48 mg/dL — ABNORMAL HIGH (ref 8–23)
CO2: 22 mmol/L (ref 22–32)
Calcium: 8.1 mg/dL — ABNORMAL LOW (ref 8.9–10.3)
Chloride: 99 mmol/L (ref 98–111)
Creatinine, Ser: 9.38 mg/dL — ABNORMAL HIGH (ref 0.61–1.24)
GFR, Estimated: 6 mL/min — ABNORMAL LOW (ref >60)
Glucose, Bld: 105 mg/dL — ABNORMAL HIGH (ref 70–99)
Phosphorus: 6.1 mg/dL — ABNORMAL HIGH (ref 2.5–4.6)
Potassium: 4.3 mmol/L (ref 3.5–5.1)
Sodium: 133 mmol/L — ABNORMAL LOW (ref 135–145)

## 2020-08-25 LAB — MAGNESIUM: Magnesium: 2.1 mg/dL (ref 1.7–2.4)

## 2020-08-25 LAB — DIFFERENTIAL
Abs Immature Granulocytes: 0.12 10*3/uL — ABNORMAL HIGH (ref 0.00–0.07)
Basophils Absolute: 0 10*3/uL (ref 0.0–0.1)
Basophils Relative: 0 %
Eosinophils Absolute: 0 10*3/uL (ref 0.0–0.5)
Eosinophils Relative: 0 %
Immature Granulocytes: 1 %
Lymphocytes Relative: 5 %
Lymphs Abs: 0.8 10*3/uL (ref 0.7–4.0)
Monocytes Absolute: 1.9 10*3/uL — ABNORMAL HIGH (ref 0.1–1.0)
Monocytes Relative: 12 %
Neutro Abs: 12.6 10*3/uL — ABNORMAL HIGH (ref 1.7–7.7)
Neutrophils Relative %: 82 %

## 2020-08-25 LAB — COMPREHENSIVE METABOLIC PANEL
ALT: 160 U/L — ABNORMAL HIGH (ref 0–44)
AST: 136 U/L — ABNORMAL HIGH (ref 15–41)
Albumin: 2.4 g/dL — ABNORMAL LOW (ref 3.5–5.0)
Alkaline Phosphatase: 53 U/L (ref 38–126)
Anion gap: 13 (ref 5–15)
BUN: 44 mg/dL — ABNORMAL HIGH (ref 8–23)
CO2: 22 mmol/L (ref 22–32)
Calcium: 8 mg/dL — ABNORMAL LOW (ref 8.9–10.3)
Chloride: 97 mmol/L — ABNORMAL LOW (ref 98–111)
Creatinine, Ser: 8.64 mg/dL — ABNORMAL HIGH (ref 0.61–1.24)
GFR, Estimated: 6 mL/min — ABNORMAL LOW (ref >60)
Glucose, Bld: 116 mg/dL — ABNORMAL HIGH (ref 70–99)
Potassium: 4.6 mmol/L (ref 3.5–5.1)
Sodium: 132 mmol/L — ABNORMAL LOW (ref 135–145)
Total Bilirubin: 0.6 mg/dL (ref 0.3–1.2)
Total Protein: 4.8 g/dL — ABNORMAL LOW (ref 6.5–8.1)

## 2020-08-25 LAB — CULTURE, BLOOD (ROUTINE X 2)
Culture: NO GROWTH
Culture: NO GROWTH
Special Requests: ADEQUATE
Special Requests: ADEQUATE

## 2020-08-25 LAB — GLUCOSE, CAPILLARY
Glucose-Capillary: 102 mg/dL — ABNORMAL HIGH (ref 70–99)
Glucose-Capillary: 105 mg/dL — ABNORMAL HIGH (ref 70–99)
Glucose-Capillary: 109 mg/dL — ABNORMAL HIGH (ref 70–99)
Glucose-Capillary: 62 mg/dL — ABNORMAL LOW (ref 70–99)
Glucose-Capillary: 80 mg/dL (ref 70–99)
Glucose-Capillary: 90 mg/dL (ref 70–99)
Glucose-Capillary: 93 mg/dL (ref 70–99)

## 2020-08-25 LAB — SURGICAL PATHOLOGY

## 2020-08-25 LAB — HEMOGLOBIN AND HEMATOCRIT, BLOOD
HCT: 24.9 % — ABNORMAL LOW (ref 39.0–52.0)
Hemoglobin: 8.5 g/dL — ABNORMAL LOW (ref 13.0–17.0)

## 2020-08-25 LAB — PROTIME-INR
INR: 1.3 — ABNORMAL HIGH (ref 0.8–1.2)
Prothrombin Time: 15.2 seconds (ref 11.4–15.2)

## 2020-08-25 SURGERY — LAPAROTOMY, EXPLORATORY
Anesthesia: General | Site: Abdomen

## 2020-08-25 MED ORDER — TRACE MINERALS CU-MN-SE-ZN 300-55-60-3000 MCG/ML IV SOLN
INTRAVENOUS | Status: AC
  Filled 2020-08-25: qty 309.6

## 2020-08-25 MED ORDER — DEXTROSE 50 % IV SOLN
INTRAVENOUS | Status: AC
  Administered 2020-08-25: 50 mL
  Filled 2020-08-25: qty 50

## 2020-08-25 MED ORDER — PENTAFLUOROPROP-TETRAFLUOROETH EX AERO: 1.0000 "application " | INHALATION_SPRAY | CUTANEOUS | Status: DC | PRN

## 2020-08-25 MED ORDER — PRISMASOL BGK 4/2.5 32-4-2.5 MEQ/L EC SOLN
Status: DC
  Filled 2020-08-25 (×16): qty 5000

## 2020-08-25 MED ORDER — LIDOCAINE-PRILOCAINE 2.5-2.5 % EX CREA: 1.0000 "application " | TOPICAL_CREAM | CUTANEOUS | Status: DC | PRN

## 2020-08-25 MED ORDER — SODIUM CHLORIDE 0.9 % FOR CRRT: INTRAVENOUS_CENTRAL | Status: DC | PRN

## 2020-08-25 MED ORDER — LIDOCAINE HCL (PF) 1 % IJ SOLN: 5.0000 mL | INTRAMUSCULAR | Status: DC | PRN

## 2020-08-25 MED ORDER — INSULIN ASPART 100 UNIT/ML ~~LOC~~ SOLN
0.0000 [IU] | Freq: Four times a day (QID) | SUBCUTANEOUS | Status: DC
  Filled 2020-08-25: qty 0.09

## 2020-08-25 MED ORDER — SODIUM CHLORIDE 0.9 % IV SOLN: 100.0000 mL | INTRAVENOUS | Status: DC | PRN

## 2020-08-25 MED ORDER — PRISMASOL BGK 4/2.5 32-4-2.5 MEQ/L REPLACEMENT SOLN
Status: DC
  Filled 2020-08-25 (×4): qty 5000

## 2020-08-25 MED ORDER — ALTEPLASE 2 MG IJ SOLR: 2.0000 mg | Freq: Once | INTRAMUSCULAR | Status: DC | PRN

## 2020-08-25 MED ORDER — PROPOFOL 10 MG/ML IV BOLUS
INTRAVENOUS | Status: AC
  Filled 2020-08-25: qty 20

## 2020-08-25 MED ORDER — SODIUM CHLORIDE 0.9 % IV SOLN
100.0000 mL | INTRAVENOUS | Status: DC | PRN
Start: 2020-08-25 — End: 2020-08-25

## 2020-08-25 MED ORDER — 0.9 % SODIUM CHLORIDE (POUR BTL) OPTIME
TOPICAL | Status: DC | PRN
  Administered 2020-08-25 (×4): 1000 mL

## 2020-08-25 MED ORDER — FENTANYL CITRATE (PF) 250 MCG/5ML IJ SOLN
INTRAMUSCULAR | Status: AC
  Filled 2020-08-25: qty 5

## 2020-08-25 MED ORDER — ROCURONIUM BROMIDE 10 MG/ML (PF) SYRINGE
PREFILLED_SYRINGE | INTRAVENOUS | Status: AC
  Filled 2020-08-25: qty 10

## 2020-08-25 MED ORDER — HEPARIN SODIUM (PORCINE) 1000 UNIT/ML DIALYSIS
1000.0000 [IU] | INTRAMUSCULAR | Status: DC | PRN
  Filled 2020-08-25: qty 6

## 2020-08-25 MED ORDER — ROCURONIUM BROMIDE 10 MG/ML (PF) SYRINGE
PREFILLED_SYRINGE | INTRAVENOUS | Status: DC | PRN
  Administered 2020-08-25: 40 mg via INTRAVENOUS

## 2020-08-25 MED ORDER — SODIUM CHLORIDE 0.9 % IV SOLN: INTRAVENOUS | Status: DC | PRN

## 2020-08-25 MED ORDER — LIDOCAINE-PRILOCAINE 2.5-2.5 % EX CREA
1.0000 "application " | TOPICAL_CREAM | CUTANEOUS | Status: DC | PRN
  Filled 2020-08-25: qty 5

## 2020-08-25 SURGICAL SUPPLY — 44 items
APL PRP STRL LF DISP 70% ISPRP (MISCELLANEOUS) ×1
BLADE CLIPPER SURG (BLADE) IMPLANT
CANISTER SUCT 3000ML PPV (MISCELLANEOUS) ×2 IMPLANT
CHLORAPREP W/TINT 26 (MISCELLANEOUS) ×2 IMPLANT
COVER SURGICAL LIGHT HANDLE (MISCELLANEOUS) ×2 IMPLANT
COVER WAND RF STERILE (DRAPES) ×2 IMPLANT
DRAPE LAPAROSCOPIC ABDOMINAL (DRAPES) ×2 IMPLANT
DRAPE WARM FLUID 44X44 (DRAPES) ×2 IMPLANT
DRSG OPSITE POSTOP 4X10 (GAUZE/BANDAGES/DRESSINGS) IMPLANT
DRSG OPSITE POSTOP 4X8 (GAUZE/BANDAGES/DRESSINGS) IMPLANT
ELECT BLADE 6.5 EXT (BLADE) IMPLANT
ELECT CAUTERY BLADE 6.4 (BLADE) ×2 IMPLANT
ELECT REM PT RETURN 9FT ADLT (ELECTROSURGICAL) ×2
ELECTRODE REM PT RTRN 9FT ADLT (ELECTROSURGICAL) ×1 IMPLANT
GLOVE BIOGEL M STRL SZ7.5 (GLOVE) ×2 IMPLANT
GLOVE INDICATOR 8.0 STRL GRN (GLOVE) ×4 IMPLANT
GOWN STRL REUS W/ TWL LRG LVL3 (GOWN DISPOSABLE) ×1 IMPLANT
GOWN STRL REUS W/TWL 2XL LVL3 (GOWN DISPOSABLE) ×2 IMPLANT
GOWN STRL REUS W/TWL LRG LVL3 (GOWN DISPOSABLE) ×2
HANDLE SUCTION POOLE (INSTRUMENTS) ×1 IMPLANT
HEMOSTAT ARISTA ABSORB 3G PWDR (HEMOSTASIS) ×2 IMPLANT
HEMOSTAT SNOW SURGICEL 2X4 (HEMOSTASIS) ×2 IMPLANT
KIT BASIN OR (CUSTOM PROCEDURE TRAY) ×2 IMPLANT
KIT TURNOVER KIT B (KITS) ×2 IMPLANT
LIGASURE IMPACT 36 18CM CVD LR (INSTRUMENTS) IMPLANT
NS IRRIG 1000ML POUR BTL (IV SOLUTION) ×4 IMPLANT
PACK GENERAL/GYN (CUSTOM PROCEDURE TRAY) ×2 IMPLANT
PAD ARMBOARD 7.5X6 YLW CONV (MISCELLANEOUS) ×2 IMPLANT
PENCIL SMOKE EVACUATOR (MISCELLANEOUS) ×2 IMPLANT
SPECIMEN JAR LARGE (MISCELLANEOUS) IMPLANT
SPONGE LAP 18X18 RF (DISPOSABLE) IMPLANT
STAPLER VISISTAT 35W (STAPLE) ×2 IMPLANT
SUCTION POOLE HANDLE (INSTRUMENTS) ×2
SUT ETHILON 2 0 FS 18 (SUTURE) ×2 IMPLANT
SUT NOVA NAB DX-16 0-1 5-0 T12 (SUTURE) ×8 IMPLANT
SUT PDS AB 1 TP1 96 (SUTURE) ×4 IMPLANT
SUT SILK 2 0 SH CR/8 (SUTURE) ×2 IMPLANT
SUT SILK 2 0 TIES 10X30 (SUTURE) ×2 IMPLANT
SUT SILK 3 0 SH CR/8 (SUTURE) ×2 IMPLANT
SUT SILK 3 0 TIES 10X30 (SUTURE) ×2 IMPLANT
SUT VIC AB 3-0 SH 18 (SUTURE) ×2 IMPLANT
TOWEL GREEN STERILE (TOWEL DISPOSABLE) ×2 IMPLANT
TRAY FOLEY MTR SLVR 16FR STAT (SET/KITS/TRAYS/PACK) IMPLANT
YANKAUER SUCT BULB TIP NO VENT (SUCTIONS) IMPLANT

## 2020-08-25 NOTE — Anesthesia Preprocedure Evaluation (Signed)
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 on home beta blockers and Pt. on medications + CAD, + Past MI, + Cardiac Stents, + Peripheral Vascular Disease and +CHF  + Valvular Problems/Murmurs MR  Rhythm:Regular Rate:Tachycardia  Echo 04/2020 1. Compared to echo report from 2014, LV function and MR are not significantly changed.  2. Left ventricular ejection fraction, by estimation, is 50%. The left ventricle has mildly decreased function. The left ventricle has no regional wall motion abnormalities. The left ventricular internal cavity size was severely dilated. There is mild left ventricular hypertrophy. Indeterminate diastolic filling due to E-A fusion.  3. Right ventricular systolic function is low normal. The right ventricular size is normal. There is mildly elevated pulmonary artery systolic pressure.  4. Left atrial size was severely dilated.  5. A small pericardial effusion is present.  6. MR is eccentric, directed posterior into LA At least moderately severe. Consider TEE to further define. . The mitral valve is abnormal. Moderate to severe mitral valve regurgitation.  7. Tricuspid valve regurgitation is moderate.  8. The aortic valve is tricuspid. Aortic valve regurgitation is not visualized. Mild aortic valve sclerosis is present, with no evidence of aortic valve stenosis.  9. Aortic dilatation noted. There is mild  dilatation of the aortic root, measuring 40 mm.  10. The inferior vena cava is normal in size with greater than 50% respiratory variability, suggesting right atrial pressure of 3 mmHg.    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 Started HD in [place], [place].  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  On NxStage home hemo since 2010. Using RUA AVF.     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   Anesthesia Other Findings Colonoscopy for melena, Hgb 9.5  Reproductive/Obstetrics negative OB ROS                             Anesthesia Physical  Anesthesia Plan  ASA: IV  Anesthesia Plan: General   Post-op Pain Management:    Induction: Intravenous  PONV Risk Score and Plan: 1 and Treatment may vary due to age or medical condition and Ondansetron  Airway Management Planned: Oral ETT  Additional Equipment: Arterial line  Intra-op Plan:   Post-operative Plan: Post-operative intubation/ventilation  Informed Consent: I have reviewed the patients History and Physical, chart, labs and discussed the procedure  including the risks, benefits and alternatives for the proposed anesthesia with the patient or authorized representative who has indicated his/her understanding and acceptance.     Dental advisory given  Plan Discussed with: CRNA  Anesthesia Plan Comments: (Allergy to heparin- gets alteplase for HD cath  lock)        Anesthesia Quick Evaluation

## 2020-08-25 NOTE — Op Note (Signed)
08/25/2020  11:29 AM  PATIENT:  Logan Martin  65 y.o. male  PRE-OPERATIVE DIAGNOSIS:  H/o splenic rupture; open abdomen  POST-OPERATIVE DIAGNOSIS:  same  PROCEDURE:  Procedure(s): RE EXPLORE EXPLORATORY LAPAROTOMY, WASHOUT AND CLOSURE  SURGEON:  Surgeon(s): Greer Pickerel, MD   ASSISTANTS: Georganna Skeans, MD; Obie Dredge PA-C   ANESTHESIA:   general  DRAINS: Penrose drain in the LUQ   LOCAL MEDICATIONS USED:  NONE  SPECIMEN:  No Specimen  DISPOSITION OF SPECIMEN:  N/A  COUNTS:  YES  INDICATION FOR PROCEDURE: Patient is a 65 year old gentleman who suffered a splenic rupture 2 days ago.  He had significant hemoperitoneum and hemorrhagic shock and underwent massive transfusion protocol resuscitation and was taken emergently to the OR for exploratory laparotomy and underwent splenectomy.  Given the fact that he was cold and coagulopathic and on multiple pressors his abdomen was left open.  Moreover he originally had come in with GI bleed who source was still unidentified.  He has been hemodynamically stable since that surgery without ongoing need for blood product administration.  He has had some intermittent bouts of hypotension however still requiring 1 vasopressor.  He underwent upper endoscopy yesterday which showed a healing Mallory-Weiss tear and some gastritis as well as grade 1 esophageal varices.  He had had a negative colonoscopy except for evidence of diverticulosis and 2 polyps which were not consistent with his source of GI bleed.  It was felt by gastroenterology that his admitting GI bleed was probably due to his Mallory-Weiss tear.  He was hemodynamically stable and was felt ready to come back to the operating room today for washout and closure  PROCEDURE: After obtaining informed consent from the granddaughter who is his POA the patient was brought straight from the ICU to the OR 1.  He was placed on the operating room table.  His endotracheal tube was connected to  the anesthesia circuit.  Sequential compression devices were already placed.  His abdomen was prepped and draped in the usual standard surgical fashion with Betadine.  He was on scheduled therapeutic antibiotics.  A surgical timeout was performed.  The wound VAC dressing was removed.  The left upper quadrant was explored.  The 2 laparotomy pads that had been left in the abdomen during his initial surgery were removed.  There was old clot in the left upper quadrant.  There was a little bit of raw oozing from the diaphragm and splenic bed.  There is still pieces of splenic capsule in situ.  There is significant hematoma and bruising of the distal transverse colon and its mesentery.  The stomach was viable.  The area that had been repaired along the greater curve of the stomach with 3 interrupted silk sutures was intact.  There is no evidence of ischemia.  The small bowel was ran starting at the ligament of Treitz down distally to the lower abdomen.  There were adhesions in the lower abdomen from his prior bladder cancer surgery and those were left alone.  There is no evidence of bowel injury.  We copiously irrigated the left upper quadrant and abdominal cavity.  Just because of the raw nature of the surgical bed I placed Arista hemostatic powder as well as a piece of surgical snow.  I decided to leave a round drain coming out of the left upper quadrant to monitor for evidence of a pancreatic tail injury.  I really was not able to visualize the tail of the pancreas due to the blood  staining on the surrounding tissue.  The drain was secured to the skin with a 2-0 nylon.  The patient had essentially no soft tissue in his abdominal wall.  The liver appeared grossly normal.  While he did have evidence of grade 1 esophageal varices on his upper endoscopy his liver was not nodular and did not have any overt signs of cirrhosis.  I closed his fascia with interrupted multiple #1 Novafil sutures.  There was a old piece of mesh  in the mid upper midline that was left intact.  Skin was left open.  It was packed with moist gauze and a drain sponge was placed around the surgical drain.  All needle, instrument and sponge counts were correct x2.  There were no immediate complications.  Patient tolerated the procedure well.  He was taken directly back to his intensive care room.  I updated the granddaughter at the end of surgery  PLAN OF CARE: already inpt  PATIENT DISPOSITION:  ICU - intubated and hemodynamically stable.   Delay start of Pharmacological VTE agent (>24hrs) due to surgical blood loss or risk of bleeding:  yes  Leighton Ruff. Redmond Pulling, MD, FACS General, Bariatric, & Minimally Invasive Surgery [facility], [place]

## 2020-08-25 NOTE — Anesthesia Procedure Notes (Signed)
Date/Time: 08/25/2020 10:38 AM Performed by: Kathryne Hitch, CRNA Pre-anesthesia Checklist: Patient identified, Emergency Drugs available, Suction available and Patient being monitored Patient Re-evaluated:Patient Re-evaluated prior to induction Oxygen Delivery Method: Ambu bag Preoxygenation: Pre-oxygenation with 100% oxygen Placement Confirmation: positive ETCO2 Tube secured with: Tape Dental Injury: Teeth and Oropharynx as per pre-operative assessment

## 2020-08-25 NOTE — Progress Notes (Signed)
Pt came back to 34M from the OR and was placed on the vent settings that are documented without any issues.

## 2020-08-25 NOTE — Progress Notes (Addendum)
[facility] ROUNDING NOTE   Subjective:   Brief history: 65 year old gentleman end-stage renal disease history of adenocarcinoma of the bladder presented with GI bleed underwent colonoscopy 08/22/2020.  On 08/23/2020 developed acute abdominal pain and acute splenic rupture with large hemoperitoneum.  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  He is on the home dialysis and maintains a Monday Wednesday Friday schedule in hospital.  He did not receive dialysis 08/24/2020 due to new acute hemodialysis needs.   Blood pressure 105/45 pulse 73 temperature 97.7 O2 sats 90% 30% FiO2  IV vasopressin IV Levophed IV Zosyn  Sodium 132 potassium 4.6 chloride 97 CO2 22 BUN 44 creatinine 8.64 glucose 06/13/2014 calcium 8 albumin 2.4 hemoglobin 8.5 AST 136 ALT 160  Objective:  Vital signs in last 24 hours:  Temp:  [96.6 F (35.9 C)-99.14 F (37.3 C)] 97.7 F (36.5 C) (03/31 0713) Pulse Rate:  [65-105] 72 (03/31 0713) Resp:  [3-21] 20 (03/31 0713) BP: (105-128)/(45-53) 105/45 (03/31 0713) SpO2:  [83 %-100 %] 100 % (03/31 0714) Arterial Line BP: (88-157)/(43-90) 105/45 (03/31 0700) FiO2 (%):  [30 %-40 %] 30 % (03/31 0714)  Weight change:  Filed Weights   08/22/20 0328 08/23/20 0300 08/24/20 0500  Weight: 50.3 kg 49.4 kg 51.5 kg    Intake/Output: I/O last 3 completed shifts: In: 3725.7 [I.V.:2902.8; IV Piggyback:822.9] Out: 1900 [Emesis/NG output:200; Drains:1700]   Intake/Output this shift:  No intake/output data recorded.  General: Non-toxic appearing, nad  Heart: normal rate Lungs: bilateral chest rise, no iwob Abdomen: soft non-distended  Extremities: No LE edema  Dialysis Access: RUE AVF +bruit    Basic Metabolic Panel: Recent Labs  Lab 08/19/20 1916 08/20/20 0404 08/22/20 0500 08/23/20 0300 08/23/20 1637 08/23/20 2040 08/24/20 0323 08/24/20 0342 08/24/20 0730 08/24/20 1010  08/25/20 0431  NA  --    < > 131* 130*   < > 138 135 136 136 135 132*  K  --    < > 4.1 4.4   < > 3.7 4.1 4.1 4.2 4.3 4.6  CL  --    < > 94* 95*  --  103 100  --   --   --  97*  CO2  --    < > 26 21*  --  21* 22  --   --   --  22  GLUCOSE  --    < > 78 88  --  207* 118*  --   --   --  116*  BUN  --    < > 38* 47*  --  32* 34*  --   --   --  44*  CREATININE  --    < > 7.98* 9.81*  --  6.52* 7.10*  --   --   --  8.64*  CALCIUM  --    < > 9.3 9.2  --  8.1* 8.1*  --   --   --  8.0*  MG 1.9  --   --   --   --   --  1.6*  --   --   --   --   PHOS  --   --   --   --   --   --  4.9*  --   --   --   --    < > = values in this interval not displayed.    Liver  Function Tests: Recent Labs  Lab 08/20/20 0404 08/21/20 0409 08/23/20 1454 08/24/20 0323 08/25/20 0431  AST 18 17 28  456* 136*  ALT 12 11 13  332* 160*  ALKPHOS 74 69 76 47 53  BILITOT 1.1 1.0 0.8 1.0 0.6  PROT 6.2* 5.5* 5.8* 4.8* 4.8*  ALBUMIN 3.4* 3.0* 3.0* 2.6* 2.4*   Recent Labs  Lab 08/23/20 1331  LIPASE 26   No results for input(s): AMMONIA in the last 168 hours.  CBC: Recent Labs  Lab 08/23/20 1331 08/23/20 1454 08/23/20 1809 08/23/20 2035 08/23/20 2040 08/23/20 2355 08/24/20 0323 08/24/20 0342 08/24/20 1010 08/24/20 1216 08/24/20 1706 08/24/20 2351 08/25/20 0431  WBC 4.5  --  9.2  --  2.4*  --  9.6  --   --   --   --   --  18.1*  NEUTROABS 3.1  --   --   --   --   --   --   --   --   --   --   --   --   HGB 4.6*   < > 10.5*   < > 9.7*   < > 9.6*   < > 8.8* 9.2* 9.3* 8.5* 8.5*  HCT 14.1*   < > 29.1*   < > 27.5*   < > 26.7*   < > 26.0* 26.2* 26.8* 24.9* 24.9*  MCV 95.3  --  85.3  --  85.9  --  84.2  --   --   --   --   --  89.6  PLT 103*  --  84*  --  78*  78*  --  102*  --   --   --   --   --  128*   < > = values in this interval not displayed.    Cardiac Enzymes: No results for input(s): CKTOTAL, CKMB, CKMBINDEX, TROPONINI in the last 168 hours.  BNP: Invalid input(s): POCBNP  CBG: Recent Labs   Lab 08/24/20 0327 08/24/20 0831 08/24/20 1638 08/24/20 1935 08/24/20 2349  GLUCAP 112* 127* 133* 145* 125*    Microbiology: Results for orders placed or performed during the hospital encounter of 08/19/20  SARS CORONAVIRUS 2 (TAT 6-24 HRS) Nasopharyngeal Nasopharyngeal Swab     Status: None   Collection Time: 08/19/20  7:16 PM   Specimen: Nasopharyngeal Swab  Result Value Ref Range Status   SARS Coronavirus 2 NEGATIVE NEGATIVE Final    Comment: (NOTE) SARS-CoV-2 target nucleic acids are NOT DETECTED.  The SARS-CoV-2 RNA is generally detectable in upper and lower respiratory specimens during the acute phase of infection. Negative results do not preclude SARS-CoV-2 infection, do not rule out co-infections with other pathogens, and should not be used as the sole basis for treatment or other patient management decisions. Negative results must be combined with clinical observations, patient history, and epidemiological information. The expected result is Negative.  Fact Sheet for Patients: SugarRoll.be  Fact Sheet for Healthcare Providers: https://www.woods-mathews.com/  This test is not yet approved or cleared by the [place] FDA and  has been authorized for detection and/or diagnosis of SARS-CoV-2 by FDA under an Emergency Use Authorization (EUA). This EUA will remain  in effect (meaning this test can be used) for the duration of the COVID-19 declaration under Se ction 564(b)(1) of the Act, 21 U.S.C. section 360bbb-3(b)(1), unless the authorization is terminated or revoked sooner.  Performed at [facility], [place] [address], [place], [place] [address]   Blood  culture (routine x 2)     Status: None (Preliminary result)   Collection Time: 08/20/20 12:21 AM   Specimen: BLOOD  Result Value Ref Range Status   Specimen Description BLOOD SITE NOT SPECIFIED  Final   Special Requests   Final    BOTTLES DRAWN AEROBIC AND  ANAEROBIC Blood Culture adequate volume   Culture   Final    NO GROWTH 4 DAYS Performed at [facility], [address], [place], [place] [address]    Report Status PENDING  Incomplete  Blood culture (routine x 2)     Status: None (Preliminary result)   Collection Time: 08/20/20  4:04 AM   Specimen: BLOOD  Result Value Ref Range Status   Specimen Description BLOOD SITE NOT SPECIFIED  Final   Special Requests AEROBIC BOTTLE ONLY Blood Culture adequate volume  Final   Culture   Final    NO GROWTH 4 DAYS Performed at [facility], [place] [address], [place], [place] [address]    Report Status PENDING  Incomplete    Coagulation Studies: Recent Labs    08/23/20 1454 08/23/20 2035 08/23/20 2040 08/25/20 0431  LABPROT 17.2* 16.5* 16.9* 15.2  INR 1.5* 1.4* 1.4* 1.3*    Urinalysis: No results for input(s): COLORURINE, LABSPEC, PHURINE, GLUCOSEU, HGBUR, BILIRUBINUR, KETONESUR, PROTEINUR, UROBILINOGEN, NITRITE, LEUKOCYTESUR in the last 72 hours.  Invalid input(s): APPERANCEUR    Imaging: DG Abd 1 View  Result Date: 08/24/2020 CLINICAL DATA:  OG tube EXAM: ABDOMEN - 1 VIEW COMPARISON:  08/23/2020 FINDINGS: Esophageal tube tip and side port overlie the proximal to mid stomach. Radiopaque presumed packing material in left upper quadrant as before. Residual enteral contrast within the colon. Nonobstructed bowel-gas pattern. Consolidation at the left base with probable small effusion. IMPRESSION: Esophageal tube tip and side port overlie the proximal to mid stomach. Nonobstructed gas pattern. Electronically Signed   By: Donavan Foil M.D.   On: 08/24/2020 19:13   CT CHEST ABDOMEN PELVIS W CONTRAST  Result Date: 08/23/2020 CLINICAL DATA:  Inpatient with acute generalized abdominal pain. End-stage renal disease on dialysis for over 6 months. EXAM: CT CHEST, ABDOMEN, AND PELVIS WITH CONTRAST TECHNIQUE: Multidetector CT imaging of the chest, abdomen and pelvis was performed following  the standard protocol during bolus administration of intravenous contrast. CONTRAST:  125m OMNIPAQUE IOHEXOL 300 MG/ML  SOLN COMPARISON:  08/20/2020 CT angiogram of the abdomen and pelvis. 06/17/2018 chest CT angiogram. FINDINGS: CT CHEST FINDINGS Cardiovascular: Normal heart size. Small to moderate pericardial effusion. Three-vessel coronary atherosclerosis. Left internal jugular central venous catheter terminates at the cavoatrial junction. Venous stent in place in right subclavian and right brachiocephalic veins. Atherosclerotic nonaneurysmal thoracic aorta. Top-normal caliber main pulmonary artery (3.3 cm diameter). No central pulmonary emboli. Mediastinum/Nodes: No discrete thyroid nodules. Patulous thoracic esophagus with layering oral contrast. No pathologically enlarged axillary, mediastinal or hilar lymph nodes. Lungs/Pleura: No pneumothorax. No right pleural effusion. Trace dependent left pleural effusion. Moderate centrilobular emphysema with mild diffuse bronchial wall thickening. A few scattered tiny solid right pulmonary nodules, largest 2 mm in peripheral right upper lobe (series 5/image 71), not appreciably changed since 11/13/2017 chest CT, considered benign. No acute consolidative airspace disease, lung masses or new significant pulmonary nodules. Musculoskeletal: No aggressive appearing focal osseous lesions. Mild diffuse sclerosis in the thoracic skeleton. Mild bilateral gynecomastia, asymmetric to the right. CT ABDOMEN PELVIS FINDINGS Hepatobiliary: Normal liver size. Several scattered simple liver cysts largest 2.4 cm in the inferior right liver. Innumerable subcentimeter  hypodense lesions scattered throughout the liver, too small to characterize, not appreciably changed. Vicarious excretion of contrast in the nondistended gallbladder. No gallbladder wall thickening. No biliary ductal dilatation. Pancreas: Normal, with no mass or duct dilation. Spleen: There is an acute subcapsular splenic  hematoma measuring 2.0 cm thickness (series 3/image 63). Overall normal spleen size. No discrete splenic mass. Adrenals/Urinary Tract: Normal adrenals. Severe atrophy of the kidneys bilaterally, unchanged. Chronic moderate right hydroureteronephrosis is unchanged. No left hydronephrosis. Numerous simple bilateral renal cysts, largest 2.2 cm in the lower right kidney. Numerous subcentimeter hypodense renal cortical lesions in both kidneys, too small to characterize, not appreciably changed. Bladder appears decompressed. Stomach/Bowel: Small hiatal hernia. Stomach is otherwise nondistended and normal. Normal caliber small bowel with no small bowel wall thickening. Oral contrast transits to the right colon. Appendix not discretely visualized. Normal large bowel with no diverticulosis, large bowel wall thickening or pericolonic fat stranding. Vascular/Lymphatic: Atherosclerotic nonaneurysmal abdominal aorta. Patent portal, splenic and diminutive renal veins. No pathologically enlarged lymph nodes in the abdomen or pelvis. Reproductive: Multiple surgical clips from apparent prostatectomy. Other: Large volume of hemoperitoneum, new since scan from 3 days prior. No pneumoperitoneum. Musculoskeletal: No aggressive appearing focal osseous lesions. Diffuse sclerosis in the lumbar spine and bilateral pelvic girdle, unchanged. Mild lumbar spondylosis. IMPRESSION: 1. Findings suggestive of acute splenic rupture with acute large volume hemoperitoneum and moderate subcapsular splenic hematoma, all new since CT scan from 3 days prior. 2. Small to moderate pericardial effusion. Trace dependent left pleural effusion. 3. Three-vessel coronary atherosclerosis. 4. Small hiatal hernia. Patulous thoracic esophagus with layering oral contrast, suggesting esophageal dysmotility and/or gastroesophageal reflux. 5. Chronic moderate right hydroureteronephrosis. Severe atrophy of the kidneys bilaterally. 6. Renal osteodystrophy. 7. Aortic  Atherosclerosis (ICD10-I70.0) and Emphysema (ICD10-J43.9). Critical Value/emergent results were called by telephone at the time of interpretation on 08/23/2020 at 3:20 pm to provider [facility] , who verbally acknowledged these results. Electronically Signed   By: Ilona Sorrel M.D.   On: 08/23/2020 15:23   DG Chest Port 1 View  Result Date: 08/25/2020 CLINICAL DATA:  Respiratory failure EXAM: PORTABLE CHEST 1 VIEW COMPARISON:  08/24/2020 FINDINGS: Endotracheal tube is seen 5.7 cm above the carina. Nasogastric tube extends into the upper abdomen. Left internal jugular central venous catheter tip noted within the superior cavoatrial junction. Vascular stents again noted within the expected right subclavian and innominate veins. Progressive left lower lobe collapse with increasing left-sided volume loss. Lungs are otherwise clear. No pneumothorax or pleural effusion. Cardiac size within normal limits. No acute bone abnormality. IMPRESSION: Stable support lines and tubes. Progressive left lower lobe collapse now with mild left-sided volume loss. Electronically Signed   By: Fidela Salisbury MD   On: 08/25/2020 05:02   DG Chest Port 1 View  Result Date: 08/24/2020 CLINICAL DATA:  Acute respiratory failure EXAM: PORTABLE CHEST 1 VIEW COMPARISON:  08/23/2020 FINDINGS: Endotracheal tube seen 5 cm above the carina. Nasogastric tube extends into the upper abdomen. Left internal jugular central venous catheter tip noted within the right atrium. Vascular stents are seen within the expected right subclavian and innominate vein. The lungs are symmetrically well expanded. Developing retrocardiac opacity may represent partial left lower lobe collapse. Tiny left pleural effusion. No pneumothorax. Cardiac size is within normal limits. Surgical clips are seen in the left axilla. IMPRESSION: Stable support lines and tubes. Partial left lower lobe collapse.  Tiny left pleural effusion. Electronically Signed   By: Fidela Salisbury MD   On: 08/24/2020  06:02   DG CHEST PORT 1 VIEW  Result Date: 08/23/2020 CLINICAL DATA:  Check endotracheal tube placement EXAM: PORTABLE CHEST 1 VIEW COMPARISON:  08/23/2020 FINDINGS: Left jugular central line is again noted with the catheter tip at the cavoatrial junction. Right subclavian and innominate vein stenting is seen. Endotracheal tube and gastric catheter are noted. The lungs are clear. Cardiac shadow is within normal limits. IMPRESSION: Tubes and lines as described above. No acute intrathoracic abnormality noted. Electronically Signed   By: Inez Catalina M.D.   On: 08/23/2020 19:10   DG Chest Port 1 View  Result Date: 08/23/2020 CLINICAL DATA:  Shortness of breath. EXAM: PORTABLE CHEST 1 VIEW COMPARISON:  Single-view of the chest 08/22/2020. FINDINGS: Left IJ central venous catheter and vascular stent along the right subclavian vessels are unchanged. Lungs are clear. No pneumothorax or pleural effusion. Heart size is normal. Aortic atherosclerosis noted. IMPRESSION: No acute disease. Aortic Atherosclerosis (ICD10-I70.0). Electronically Signed   By: Inge Rise M.D.   On: 08/23/2020 14:26   DG Abd Portable 1V  Result Date: 08/23/2020 CLINICAL DATA:  Abdominal pain. EXAM: PORTABLE ABDOMEN - 1 VIEW COMPARISON:  March 10, 2012. FINDINGS: The bowel gas pattern is normal. Residual contrast is seen throughout the small bowel. Surgical staples are noted in the pelvis. No radio-opaque calculi or other significant radiographic abnormality are seen. IMPRESSION: No evidence of bowel obstruction or ileus. Electronically Signed   By: Marijo Conception M.D.   On: 08/23/2020 14:26   DG OR LOCAL ABDOMEN  Result Date: 08/23/2020 CLINICAL DATA:  Recent exploratory laparotomy EXAM: OR LOCAL ABDOMEN COMPARISON:  CT from earlier in the same day. FINDINGS: Gastric catheter is noted within the stomach. Scattered contrast is noted throughout the bowel consistent with the recent CT examination. By  carious excretion of contrast is noted within the gallbladder. Left femoral sheath is noted. Laparotomy pads are noted in the left upper quadrant. By clinical history, these were left on purpose for packing. IMPRESSION: Postsurgical changes with laparotomy pads used as packing in the left upper quadrant. No other focal abnormality is noted. Electronically Signed   By: Inez Catalina M.D.   On: 08/23/2020 19:09     Medications:   . sodium chloride 10 mL/hr at 08/24/20 1200  . epinephrine 0 mcg/min (08/23/20 2105)  . fentaNYL infusion INTRAVENOUS 400 mcg/hr (08/25/20 0600)  . norepinephrine (LEVOPHED) Adult infusion 6 mcg/min (08/25/20 0600)  . piperacillin-tazobactam (ZOSYN)  IV Stopped (08/25/20 0458)  . vancomycin    . vasopressin Stopped (08/25/20 0521)   . sodium chloride   Intravenous Once  . sodium chloride   Intravenous Once  . chlorhexidine gluconate (MEDLINE KIT)  15 mL Mouth Rinse BID  . Chlorhexidine Gluconate Cloth  6 each Topical Q0600  . docusate  100 mg Per Tube BID  . mouth rinse  15 mL Mouth Rinse 10 times per day  . pantoprazole  40 mg Intravenous Q12H  . polyethylene glycol  17 g Per Tube Daily  . sodium chloride flush  10-40 mL Intracatheter Q12H   sodium chloride, fentaNYL, lidocaine, midazolam, midazolam, ondansetron **OR** ondansetron (ZOFRAN) IV, sodium chloride flush  Assessment/ Plan:  Dialysis Orders: Nx Stage MTTF EDW 46.5kg  Assessment/Plan: 1. Splenic rupture: Went to the OR on 3/29.  Surgery assisting.  Anemia seems to be stabilizing 2. Melena/GIB/hemorrhagic shock-status post transfusions for possible diverticular bleed but now with massive bleeding related to splenic rupture.  Transfusions per primary. 3. ESRD -on home dialysis maintaining  MWF schedule here.  No acute need for dialysis.    Most likely will need CRRT due to hypotension. Discussed with Critical Care Medicine. Patient weaning pressors and is going to OR today. Will hold off on plans to  start CRRT today as there are no acute dialysis needs  4. Anemia --transfusions and hemorrhage as above.  Received Aranesp 60 with HD 3/26.  5. Hypertension/volume - no BP meds while on pressors 6. Metabolic bone disease -Continue Auryxia, calcitriol, Sensipar when taking po.  7. Oral abscess - now off Augmentin; oral hygiene      LOS: 5 Sherril Croon @TODAY @7 :39 AM

## 2020-08-25 NOTE — Transfer of Care (Signed)
Immediate Anesthesia Transfer of Care Note  Patient: Logan Martin  Procedure(s) Performed: RE EXPLORE EXPLORATORY LAPAROTOMY, WASHOUT AND CLOSURE (N/A Abdomen)  Patient Location: ICU  Anesthesia Type:General  Level of Consciousness: Patient remains intubated per anesthesia plan  Airway & Oxygen Therapy: Patient remains intubated per anesthesia plan and Patient placed on Ventilator (see vital sign flow sheet for setting)  Post-op Assessment: Report given to RN and Post -op Vital signs reviewed and stable  Post vital signs: Reviewed and stable  Last Vitals:  Vitals Value Taken Time  BP    Temp    Pulse    Resp    SpO2      Last Pain:  Vitals:   08/25/20 1000  TempSrc: Esophageal  PainSc:          Complications: No complications documented.

## 2020-08-25 NOTE — Progress Notes (Addendum)
PHARMACY - TOTAL PARENTERAL NUTRITION CONSULT NOTE  Indication:  Intolerance to EN  Patient Measurements: Height: 5' 4"  (162.6 cm) Weight: 51.5 kg (113 lb 8.6 oz) IBW/kg (Calculated) : 59.2 TPN AdjBW (KG): 49.1 Body mass index is 19.49 kg/m.  Weight 45.5 kg on admit, now 51.5 kg d/t volume  Assessment:  64 YOM presented on 08/19/20 with chest pain and SOB.  Had hematochezia and CTA was negative for overt bleeding.  Colonoscopy on 3/28 showed diverticulosis and polyps that were resected.  Patient deteriorated on 3/29, requiring intubation and emergent ex-lap with splenectomy and wound VAC application for splenic rupture.  Abdomen left open. EGD on 3/30 revealed esophageal varices with small hiatal hernia and healing Mallory-Weiss tear.  Patient is currently in the OR for possible abdominal closure.  Pharmacy consulted for TPN management.  Patient has moderate PCM.  Glucose / Insulin: no hx DM - CBGs < 180 Electrolytes: low Na/CL, Phos was 4.9 yesterday, Mag 1.6 yesterday and 3gm given, iCa low and CaGluc 1gm given on 3/30, others WNL Renal: ESRD on MWF HD, may need CRRT.  BUN 44 Hepatic: AST/ALT down to 136/160, alk phos / tbili / TG WNL Intake / Output; MIVF: net +12.5L GI Imaging: none since TPN start GI Surgeries / Procedures:  3/31 OR - pending  Central access: PICC placed 08/20/20, CVC single lumen placed 08/23/20 TPN start date: 08/25/20  Nutritional Goals (per RD rec on 3/30): 1500 kCal and 80-95gm AA per day  Current Nutrition:  NPO  Plan:  Start concentrated TPN at 75m/hr at 1800 (goal rate 55 ml/hr) Electrolytes in TPN: Na 169m/L, no K / Phos / Mag for now, Ca 78m35mL, Cl:Ac 1:1 Add standard MVI and trace elements to TPN.  Remove chromium d/t ESRD. Initiate sensitive SSI Q6H Standard TPN labs and nursing care orders D/C PT docusate and Miralax  Liyat Faulkenberry D. DanMina MarbleharmD, BCPS, BCCClarks Green31/2022, 11:19 AM

## 2020-08-25 NOTE — Anesthesia Postprocedure Evaluation (Signed)
Anesthesia Post Note  Patient: Logan Martin  Procedure(s) Performed: RE EXPLORE EXPLORATORY LAPAROTOMY, WASHOUT AND CLOSURE (N/A Abdomen)     Patient location during evaluation: PACU Anesthesia Type: General Level of consciousness: patient remains intubated per anesthesia plan Pain management: pain level controlled Vital Signs Assessment: post-procedure vital signs reviewed and stable Respiratory status: nonlabored ventilation, respiratory function stable, patient on ventilator - see flowsheet for VS and respiratory function unstable Cardiovascular status: unstable Postop Assessment: no apparent nausea or vomiting Anesthetic complications: no Comments: Patient taken to ICU intubated with full monitoring. Connected to ventilator with pre op settings.   No complications documented.  Last Vitals:  Vitals:   08/25/20 1015 08/25/20 1146  BP:    Pulse:    Resp: 16   Temp:  (!) 35 C  SpO2:      Last Pain:  Vitals:   08/25/20 1000  TempSrc: Esophageal  PainSc:                  Jerico Grisso A.

## 2020-08-25 NOTE — Progress Notes (Signed)
NAME:  Logan Martin, MRN:  [id number], DOB:  [birth year], LOS: 5 ADMISSION DATE:  08/19/2020, CONSULTATION DATE:  08/23/2020 REFERRING MD:  Dr. Waldron Labs, CHIEF COMPLAINT:  hypotension  History of Present Illness:  65 year old male with prior history of ESRD, HTN, HLD, CAD, history of adenocarcinoma of the bladder, anemia, thrombocytopenia, hx of PE, hepatitis C presenting on 3/25 with progressive fatigue and generalized weakness with dark stools for 1 week.   Work-up significant for anemia and melanotic stools.  CTA abdomen pelvis showed no active source of GI bleeding.  It did note focal areas of high-grade stenosis within distal branches of the SMA in the right lower quadrant. Due to poor IV access, a central line was placed by IR.  Nephrology and [facility] consulted.  Underwent endoscopy on 3/28 which showed diverticulosis, 2 polyps were removed and sent for biopsy but showed no evidence of GI bleed.  Patient has required ongoing transfusion throughout hospitalization for ongoing anemia of unclear source.  On 3/29 patient acutely complained of worsening abdominal and chronic back pain, shortness of breath, was diaphoretic, and progressively hypotensive with SBP into the  40's.  Also noted to be hypothermic with rectal temp not reading.  Rapid response initiated.  A small fluid bolus started and then Levaquin started.  X-ray and KUB ordered.  Surgery and PCCM consulted urgently.  Patient remains awake although very hard of hearing complaining of diffuse abdominal pain and trying to use the bedpan with no results.    Addendum: on chart review, patient recently traveling.  Had iHD out of town, possibly Wednesday and was not able to get blood returned back.  Hgb of 7.9 on admit.    Pertinent  Medical History  ESRD (on home HD MTTF), HTN, HLD, CAD, history of adenocarcinoma of the bladder, anemia, thrombocytopenia, hx of PE, hepatitis C  Hard of hearing   Significant Hospital Events: Including  procedures, antibiotic start and stop dates in addition to other pertinent events   . 3/25 admitted to [facility], nephrology following  . 3/26 L IJ DL CVL placed by IR (of note, unable to pass via right IJ due to occlusion and lower right IJ at venous stent) . 3/27 [facility] consulted . 3/28 colonoscopy by Dr. Lyndel Safe with two polyps removed, required intubation during procedure for flash pulmonary edema; extubated post procedure . 3/29 transfused 4th unit PRBC this am.  During afternoon, acute decompensation/ hypotension on floor -> RRT, vasopresors started, stat CT abd/ pelvis and surgical consult -> intubated, then to OR with ruptured spleen s/p MTP and splenectomy  Interim History / Subjective:  No acute change overnight  Weaning pressors - now on levo 69mcg/min, vasopressin off Hgb stable  Plan for back to OR today for abd closure    Objective   Blood pressure (!) 104/46, pulse 73, temperature (!) 97.52 F (36.4 C), temperature source Esophageal, resp. rate 18, height 5\' 4"  (1.626 m), weight 51.5 kg, SpO2 100 %. CVP:  [6 mmHg] 6 mmHg  Vent Mode: PRVC FiO2 (%):  [30 %-40 %] 30 % Set Rate:  [20 bmp] 20 bmp Vt Set:  [350 mL] 350 mL PEEP:  [5 cmH20] 5 cmH20 Plateau Pressure:  [14 cmH20] 14 cmH20   Intake/Output Summary (Last 24 hours) at 08/25/2020 0844 Last data filed at 08/25/2020 0800 Gross per 24 hour  Intake 2105.48 ml  Output 400 ml  Net 1705.48 ml   Filed Weights   08/22/20 0328 08/23/20 0300 08/24/20 0500  Weight: 50.3 kg 49.4 kg 51.5 kg   Examination: General:  Critically ill appearing male, NAD HEENT: MM pink/moist, ETT Neuro: sedated on vent, RASS -1, opens eyes to voice, follows some simple commands CV: s1s2 rrr, no m/r/g PULM:  resps even non labored on vent, coarse  GI: soft, wound vac in place with serosanguinous drainage Extremities: warm/dry, no sig edema  Skin: no rashes or lesions   Labs/imaging that I havepersonally reviewed  (right click and "Reselect all  SmartList Selections" daily)  3/25 SARS/ flu >> neg 3/26 BCx >>  3/25 amoxicillin >> 3/26 3/29 zosyn >>  3/29 vanc >>  3/26 L IJ CVL>> 3/29 ETT >> 3/29 R femoral Aline >> 3/29 L femoral cordis >>  3/26 CTA abd/ pelvis >> VASCULAR 1. No evidence of active gastrointestinal hemorrhage. 2. Extensive atherosclerosis of the aorta and its branches. Focal areas of high-grade stenosis within distal branches of the SMA in the right lower quadrant. High-grade bilateral renal artery stenosis, left greater than right NON-VASCULAR 1. Numerous bilateral hepatic cysts unchanged. 2. Bilateral renal cortical atrophy with diffuse cystic change, consistent with known end-stage renal disease. 3. No acute intra-abdominal or intrapelvic process. 4. Aortic Atherosclerosis and Emphysema   3/28 colonoscopy >> One 6 mm polyp in the proximal transverse colon, removed with a cold snare. Resected and retrieved.  One 6 mm polyp in the mid descending colon, removed with a cold snare. Resected and retrieved.  Diverticulosis in the sigmoid colon and in the ascending colon.  Non-bleeding internal hemorrhoids.  3/29 CTA a/p >> acute splenic rupture with large hemoperitoneum and moderate subscapsular splenic hematoma  Resolved Hospital Problem list    Assessment & Plan:   Shock- hemorraghic secondary to splenic rupture and hemoperitoneum s/p ex lap and splenectomy 3/29 s/p MTP.  Improving. Weaning pressors  PLAN  - Surgery following, appreciate excellent care - abd remains open, plan back to OR 3/31 - monitor wound vac output - trending H/H, coags, transfuse for Hgb < 7 - continue NE for MAP goal > 65 and wean as able  - trend CVP  - continue broad spectrum abx - follow cultures  - trend CBC/ CMET  ABLA/ symptomatic anemia - had iHD out town last week, was unable to get blood back  - acute decompensation 3/29 2/2 to splenic rupture, prior to that no evidence of active bleed causing symptomatic anemia by CTA  a/p or colonoscopy - s/p MTP 3/29 (9 PRBC, 6 FFP, 1 platelet, 1 cryo ) w/ premedication given known antibodies; had 4 PRBC prior to MTP since admission PLAN  - Trend CBC - transfuse for Hgb < 7 - monitor coags  Acute hypoxic respiratory failure  PLAN -  Vent support - 8cc/kg  F/u CXR  F/u prevention protocol  PAD protocol  F/u ABG PRN   ESRD on HD - Nephrology following  - still no current metabolic derangements for CRRT, but suspect he will need at some point given his hemodynamics, ongoing vasopressor support  - trend renal indices  -will likely need CRRT given hypotension   Hypomag 1.6 iCa 1.02 -f/u chem    Best practice (right click and "Reselect all SmartList Selections" daily)  Diet:  NPO Pain/Anxiety/Delirium protocol (if indicated): Yes (RASS goal -2) VAP protocol (if indicated): Yes DVT prophylaxis: Contraindicated, SCDs GI prophylaxis: PPI BID Glucose control:  SSI Yes Central venous access:  Yes, and it is still needed Arterial line:  Yes, and it is still needed Foley:  N/A  anuric at baseline Mobility:  bed rest  PT consulted: N/A Last date of multidisciplinary goals of care discussion [ongoing] Code Status:  full code Disposition: ICU  Family updated extensively 3/31 at bedside and via phone by Dr. Redmond Pulling  Labs   CBC: Recent Labs  Lab 08/23/20 1331 08/23/20 1454 08/23/20 1809 08/23/20 2035 08/23/20 2040 08/23/20 2355 08/24/20 0323 08/24/20 0342 08/24/20 1010 08/24/20 1216 08/24/20 1706 08/24/20 2351 08/25/20 0431  WBC 4.5  --  9.2  --  2.4*  --  9.6  --   --   --   --   --  18.1*  NEUTROABS 3.1  --   --   --   --   --   --   --   --   --   --   --   --   HGB 4.6*   < > 10.5*   < > 9.7*   < > 9.6*   < > 8.8* 9.2* 9.3* 8.5* 8.5*  HCT 14.1*   < > 29.1*   < > 27.5*   < > 26.7*   < > 26.0* 26.2* 26.8* 24.9* 24.9*  MCV 95.3  --  85.3  --  85.9  --  84.2  --   --   --   --   --  89.6  PLT 103*  --  84*  --  78*  78*  --  102*  --   --   --   --    --  128*   < > = values in this interval not displayed.    Basic Metabolic Panel: Recent Labs  Lab 08/19/20 1916 08/20/20 0404 08/22/20 0500 08/23/20 0300 08/23/20 1637 08/23/20 2040 08/24/20 0323 08/24/20 0342 08/24/20 0730 08/24/20 1010 08/25/20 0431  NA  --    < > 131* 130*   < > 138 135 136 136 135 132*  K  --    < > 4.1 4.4   < > 3.7 4.1 4.1 4.2 4.3 4.6  CL  --    < > 94* 95*  --  103 100  --   --   --  97*  CO2  --    < > 26 21*  --  21* 22  --   --   --  22  GLUCOSE  --    < > 78 88  --  207* 118*  --   --   --  116*  BUN  --    < > 38* 47*  --  32* 34*  --   --   --  44*  CREATININE  --    < > 7.98* 9.81*  --  6.52* 7.10*  --   --   --  8.64*  CALCIUM  --    < > 9.3 9.2  --  8.1* 8.1*  --   --   --  8.0*  MG 1.9  --   --   --   --   --  1.6*  --   --   --   --   PHOS  --   --   --   --   --   --  4.9*  --   --   --   --    < > = values in this interval not displayed.   GFR: Estimated Creatinine Clearance: 6.3 mL/min (A) (by C-G formula based on SCr of 8.64 mg/dL (H)). Recent  Labs  Lab 08/20/20 0404 08/21/20 0409 08/23/20 1400 08/23/20 1809 08/23/20 2040 08/24/20 0323 08/25/20 0431  WBC 4.8   < >  --  9.2 2.4* 9.6 18.1*  LATICACIDVEN 1.6  --  6.3* 6.3*  --  1.8  --    < > = values in this interval not displayed.    Liver Function Tests: Recent Labs  Lab 08/20/20 0404 08/21/20 0409 08/23/20 1454 08/24/20 0323 08/25/20 0431  AST 18 17 28  456* 136*  ALT 12 11 13  332* 160*  ALKPHOS 74 69 76 47 53  BILITOT 1.1 1.0 0.8 1.0 0.6  PROT 6.2* 5.5* 5.8* 4.8* 4.8*  ALBUMIN 3.4* 3.0* 3.0* 2.6* 2.4*   Recent Labs  Lab 08/23/20 1331  LIPASE 26   No results for input(s): AMMONIA in the last 168 hours.  ABG    Component Value Date/Time   PHART 7.486 (H) 08/24/2020 1010   PCO2ART 34.3 08/24/2020 1010   PO2ART 171 (H) 08/24/2020 1010   HCO3 25.9 08/24/2020 1010   TCO2 27 08/24/2020 1010   ACIDBASEDEF 5.0 (H) 08/23/2020 1750   O2SAT 100.0 08/24/2020  1010     Coagulation Profile: Recent Labs  Lab 08/19/20 1855 08/23/20 1454 08/23/20 2035 08/23/20 2040 08/25/20 0431  INR 1.1 1.5* 1.4* 1.4* 1.3*    Cardiac Enzymes: No results for input(s): CKTOTAL, CKMB, CKMBINDEX, TROPONINI in the last 168 hours.  HbA1C: No results found for: HGBA1C  CBG: Recent Labs  Lab 08/24/20 0831 08/24/20 1638 08/24/20 1935 08/24/20 2349 08/25/20 0739  GLUCAP 127* 133* 145* 125* 93     Critical care time: 32 mins     Nickolas Madrid, NP Pulmonary/Critical Care Medicine  08/25/2020  8:44 AM    See Amion for pager If no response to pager, please call PCCM pager  After 7:00 pm call Elink

## 2020-08-25 NOTE — Progress Notes (Signed)
Nutrition Follow-up  DOCUMENTATION CODES:   Non-severe (moderate) malnutrition in context of chronic illness  INTERVENTION:   - TPN per Pharmacy  NUTRITION DIAGNOSIS:   Moderate Malnutrition related to chronic illness (ESRD on home HD, CHF) as evidenced by moderate fat depletion,severe muscle depletion.  Ongoing, being addressed via initiation of TPN  GOAL:   Patient will meet greater than or equal to 90% of their needs  Progressing with initiation of TPN  MONITOR:   Vent status,Labs,Weight trends,I & O's,Skin  REASON FOR ASSESSMENT:   Ventilator    ASSESSMENT:   65 year old male who presented to the ED on 3/25 with chest pain and SOB. PMH of ESRD on home HD, CAD, CHF, PVD, adenocarcinoma of the bladder s/p TURBT, anemia, thrombocytopenia, PE, hepatitis C.  3/28 - colonoscopy (findings of polyp and diverticulosis, no active bleeding), intubated during procedure due to flash pulmonary edema, later extubated 3/29 - hemorrhagic shock secondary to splenic rupture, transferred to ICU, intubated, s/p ex-lap with splenectomy and application of abdominal wound VAC 3/30 - EGD revealing esophageal varices, small hiatal hernia, healing Mallory-Weiss tear 3/31 - s/p ex-lap with washout and closure  Discussed pt with Surgery PA and with Nephrology. Plan to start TPN today. Concentrated TPN to start today at 30 ml/hr with goal rate being 55 ml/hr.  Per discussion with Nephrology, pt likely to start on CRRT tomorrow.  Noted OG tube removed yesterday during EGD.  EDW: 46.5 kg Admit weight: 49.1 kg Current weight: 51.5 kg  Patient remains intubated on ventilator support MV: 6.9 L/min Temp (24hrs), Avg:97.7 F (36.5 C), Min:94.1 F (34.5 C), Max:99.14 F (37.3 C) BP (a-line): 95/47 MAP (a-line): 64  Drips: Fentanyl Levophed  Medications reviewed and include: SSI q 6 hours, IV protonix, IV abx, TPN  Labs reviewed: sodium 132, elevated LFTs, hemoglobin 8.5 CBG's: 62-145  x 24 hours  I/O's: +12.6 L since admit  Diet Order:   Diet Order            Diet NPO time specified  Diet effective now                 EDUCATION NEEDS:   No education needs have been identified at this time  Skin:  Skin Assessment: Skin Integrity Issues: Incisions: abdomen  Last BM:  08/19/20  Height:   Ht Readings from Last 1 Encounters:  08/20/20 5\' 4"  (1.626 m)    Weight:   Wt Readings from Last 1 Encounters:  08/24/20 51.5 kg    BMI:  Body mass index is 19.49 kg/m.  Estimated Nutritional Needs:   Kcal:  1500  Protein:  80-95 grams  Fluid:  >/= 1.5 L    Gustavus Bryant, MS, RD, LDN Inpatient Clinical Dietitian Please see AMiON for contact information.

## 2020-08-25 NOTE — Progress Notes (Signed)
Patient transferred to OR by CRNA, and 2 OR staff members for procedure.

## 2020-08-25 NOTE — OR Nursing (Signed)
2 18x18 lap sponges from prior intentional packing removed during surgery.

## 2020-08-25 NOTE — Progress Notes (Signed)
Hypoglycemic Event  CBG: 62  Treatment: D50 50 mL (25 gm)  Symptoms: None  Follow-up CBG: Time:1213 CBG Result:105  Possible Reasons for Event: Inadequate meal intake      [facility]

## 2020-08-26 ENCOUNTER — Encounter (HOSPITAL_COMMUNITY): Payer: Self-pay | Admitting: General Surgery

## 2020-08-26 ENCOUNTER — Inpatient Hospital Stay (HOSPITAL_COMMUNITY): Payer: Medicare HMO

## 2020-08-26 DIAGNOSIS — Z992 Dependence on renal dialysis: Secondary | ICD-10-CM | POA: Diagnosis not present

## 2020-08-26 DIAGNOSIS — R578 Other shock: Secondary | ICD-10-CM | POA: Diagnosis not present

## 2020-08-26 DIAGNOSIS — N186 End stage renal disease: Secondary | ICD-10-CM | POA: Diagnosis not present

## 2020-08-26 DIAGNOSIS — J9601 Acute respiratory failure with hypoxia: Secondary | ICD-10-CM | POA: Diagnosis not present

## 2020-08-26 LAB — COMPREHENSIVE METABOLIC PANEL
ALT: 82 U/L — ABNORMAL HIGH (ref 0–44)
AST: 79 U/L — ABNORMAL HIGH (ref 15–41)
Albumin: 2.3 g/dL — ABNORMAL LOW (ref 3.5–5.0)
Alkaline Phosphatase: 56 U/L (ref 38–126)
Anion gap: 13 (ref 5–15)
BUN: 54 mg/dL — ABNORMAL HIGH (ref 8–23)
CO2: 20 mmol/L — ABNORMAL LOW (ref 22–32)
Calcium: 8.2 mg/dL — ABNORMAL LOW (ref 8.9–10.3)
Chloride: 98 mmol/L (ref 98–111)
Creatinine, Ser: 9.89 mg/dL — ABNORMAL HIGH (ref 0.61–1.24)
GFR, Estimated: 5 mL/min — ABNORMAL LOW (ref >60)
Glucose, Bld: 105 mg/dL — ABNORMAL HIGH (ref 70–99)
Potassium: 4 mmol/L (ref 3.5–5.1)
Sodium: 131 mmol/L — ABNORMAL LOW (ref 135–145)
Total Bilirubin: 0.7 mg/dL (ref 0.3–1.2)
Total Protein: 5 g/dL — ABNORMAL LOW (ref 6.5–8.1)

## 2020-08-26 LAB — DIFFERENTIAL
Abs Immature Granulocytes: 0.11 10*3/uL — ABNORMAL HIGH (ref 0.00–0.07)
Basophils Absolute: 0 10*3/uL (ref 0.0–0.1)
Basophils Relative: 0 %
Eosinophils Absolute: 0 10*3/uL (ref 0.0–0.5)
Eosinophils Relative: 0 %
Immature Granulocytes: 1 %
Lymphocytes Relative: 6 %
Lymphs Abs: 0.8 10*3/uL (ref 0.7–4.0)
Monocytes Absolute: 1.5 10*3/uL — ABNORMAL HIGH (ref 0.1–1.0)
Monocytes Relative: 11 %
Neutro Abs: 10.7 10*3/uL — ABNORMAL HIGH (ref 1.7–7.7)
Neutrophils Relative %: 82 %

## 2020-08-26 LAB — RENAL FUNCTION PANEL
Albumin: 2.4 g/dL — ABNORMAL LOW (ref 3.5–5.0)
Anion gap: 9 (ref 5–15)
BUN: 24 mg/dL — ABNORMAL HIGH (ref 8–23)
CO2: 26 mmol/L (ref 22–32)
Calcium: 8.6 mg/dL — ABNORMAL LOW (ref 8.9–10.3)
Chloride: 101 mmol/L (ref 98–111)
Creatinine, Ser: 5.09 mg/dL — ABNORMAL HIGH (ref 0.61–1.24)
GFR, Estimated: 12 mL/min — ABNORMAL LOW (ref >60)
Glucose, Bld: 129 mg/dL — ABNORMAL HIGH (ref 70–99)
Phosphorus: 3.3 mg/dL (ref 2.5–4.6)
Potassium: 3.2 mmol/L — ABNORMAL LOW (ref 3.5–5.1)
Sodium: 136 mmol/L (ref 135–145)

## 2020-08-26 LAB — PHOSPHORUS: Phosphorus: 5 mg/dL — ABNORMAL HIGH (ref 2.5–4.6)

## 2020-08-26 LAB — GLUCOSE, CAPILLARY
Glucose-Capillary: 104 mg/dL — ABNORMAL HIGH (ref 70–99)
Glucose-Capillary: 112 mg/dL — ABNORMAL HIGH (ref 70–99)
Glucose-Capillary: 114 mg/dL — ABNORMAL HIGH (ref 70–99)
Glucose-Capillary: 86 mg/dL (ref 70–99)
Glucose-Capillary: 86 mg/dL (ref 70–99)
Glucose-Capillary: 97 mg/dL (ref 70–99)

## 2020-08-26 LAB — CBC
HCT: 24 % — ABNORMAL LOW (ref 39.0–52.0)
Hemoglobin: 8.1 g/dL — ABNORMAL LOW (ref 13.0–17.0)
MCH: 31.2 pg (ref 26.0–34.0)
MCHC: 33.8 g/dL (ref 30.0–36.0)
MCV: 92.3 fL (ref 80.0–100.0)
Platelets: 155 10*3/uL (ref 150–400)
RBC: 2.6 MIL/uL — ABNORMAL LOW (ref 4.22–5.81)
RDW: 16.2 % — ABNORMAL HIGH (ref 11.5–15.5)
WBC: 13.1 10*3/uL — ABNORMAL HIGH (ref 4.0–10.5)
nRBC: 5.5 % — ABNORMAL HIGH (ref 0.0–0.2)

## 2020-08-26 LAB — MAGNESIUM: Magnesium: 2 mg/dL (ref 1.7–2.4)

## 2020-08-26 LAB — MRSA PCR SCREENING: MRSA by PCR: NEGATIVE

## 2020-08-26 LAB — PREALBUMIN: Prealbumin: 16.7 mg/dL — ABNORMAL LOW (ref 18–38)

## 2020-08-26 MED ORDER — SODIUM CHLORIDE 0.9 % IV SOLN: 100.0000 mL | INTRAVENOUS | Status: DC | PRN

## 2020-08-26 MED ORDER — PHENYLEPHRINE HCL-NACL 10-0.9 MG/250ML-% IV SOLN
0.0000 ug/min | INTRAVENOUS | Status: DC
  Administered 2020-08-26: 10 ug/min via INTRAVENOUS
  Administered 2020-08-26: 70 ug/min via INTRAVENOUS
  Filled 2020-08-26 (×2): qty 250

## 2020-08-26 MED ORDER — LIDOCAINE HCL (PF) 1 % IJ SOLN: 5.0000 mL | INTRAMUSCULAR | Status: DC | PRN

## 2020-08-26 MED ORDER — PENTAFLUOROPROP-TETRAFLUOROETH EX AERO
1.0000 | INHALATION_SPRAY | CUTANEOUS | Status: DC | PRN
Start: 2020-08-26 — End: 2020-08-26

## 2020-08-26 MED ORDER — TRACE MINERALS CU-MN-SE-ZN 300-55-60-3000 MCG/ML IV SOLN
INTRAVENOUS | Status: AC
  Filled 2020-08-26: qty 567.6

## 2020-08-26 MED ORDER — CHLORHEXIDINE GLUCONATE CLOTH 2 % EX PADS
6.0000 | MEDICATED_PAD | Freq: Every day | CUTANEOUS | Status: DC
  Administered 2020-08-26 – 2020-08-27 (×2): 6 via TOPICAL

## 2020-08-26 MED ORDER — PIVOT 1.5 CAL PO LIQD
1000.0000 mL | ORAL | Status: DC
  Administered 2020-08-26: 1000 mL
  Filled 2020-08-26: qty 1000

## 2020-08-26 MED ORDER — LIDOCAINE-PRILOCAINE 2.5-2.5 % EX CREA: 1.0000 "application " | TOPICAL_CREAM | CUTANEOUS | Status: DC | PRN

## 2020-08-26 MED ORDER — MIDAZOLAM HCL 2 MG/2ML IJ SOLN
1.0000 mg | INTRAMUSCULAR | Status: DC | PRN
  Administered 2020-08-26 – 2020-08-27 (×3): 2 mg via INTRAVENOUS
  Filled 2020-08-26 (×3): qty 2

## 2020-08-26 MED ORDER — ALTEPLASE 2 MG IJ SOLR: 2.0000 mg | Freq: Once | INTRAMUSCULAR | Status: DC | PRN

## 2020-08-26 NOTE — Anesthesia Postprocedure Evaluation (Signed)
Anesthesia Post Note  Patient: Logan Martin  Procedure(s) Performed: EXPLORATORY LAPAROTOMY (N/A Abdomen) SPLENECTOMY (N/A Abdomen) APPLICATION OF ABDOMINAL WOUND VAC (N/A Abdomen)     Patient location during evaluation: ICU Anesthesia Type: General Level of consciousness: sedated, patient remains intubated per anesthesia plan and responds to stimulation Pain management: pain level controlled Vital Signs Assessment: post-procedure vital signs reviewed and stable Respiratory status: patient on ventilator - see flowsheet for VS and patient remains intubated per anesthesia plan (continuing weaning from vent) Cardiovascular status: stable (still requiring pressor support) Postop Assessment: no apparent nausea or vomiting Anesthetic complications: no Comments: Much more stable   No complications documented.  Last Vitals:  Vitals:   08/26/20 1600 08/26/20 1615  BP: 100/61 (!) 110/56  Pulse: (!) 126 (!) 129  Resp: (!) 23 (!) 28  Temp:    SpO2:      Last Pain:  Vitals:   08/26/20 1600  TempSrc: Axillary  PainSc:                  Madora Barletta,E. Aline Wesche

## 2020-08-26 NOTE — Progress Notes (Signed)
[facility] Progress Note  1 Day Post-Op  Subjective: CC:  Intubated, lightly sedated. Opening eyes to voice and shaking head appropriately in response to questions. He denies pain. Daughter is at bedside.   He is on pressor support with levo Hgb/hct stable,8.1/24 from 8.4/24.6, has not required transfusion in over 24 hours Cr 9.89, nephrology following   Objective: Vital signs in last 24 hours: Temp:  [94.1 F (34.5 C)-99.86 F (37.7 C)] 99.32 F (37.4 C) (04/01 0830) Pulse Rate:  [66-97] 94 (04/01 0830) Resp:  [0-21] 18 (04/01 0000) BP: (84-105)/(26-62) 91/34 (03/31 1600) SpO2:  [90 %-100 %] 97 % (04/01 0830) Arterial Line BP: (82-144)/(44-78) 102/50 (04/01 0830) FiO2 (%):  [30 %] 30 % (04/01 0600) Weight:  [57.3 kg] 57.3 kg (04/01 0500) Last BM Date: 08/19/20  Intake/Output from previous day: 03/31 0701 - 04/01 0700 In: 2263.4 [I.V.:2113.3; IV Piggyback:150] Out: 225 [Emesis/NG output:150; Drains:45; Blood:30] Intake/Output this shift: Total I/O In: 50.1 [I.V.:50.1] Out: -   PE: Gen:  Intubated, lightly, chronically ill appearing male, edematous  Card:  Regular rate and rhythm, no LE edema Pulm:  Ventilated respirations clear to auscultation bilaterally Abd: firm, midline wound c/d/i - no bleeding no purulence, fascial sutures in tact  LUQ blake drain: 45 cc/24h SS  OG: 150 cc/24h Skin: warm and dry, no rashes; AVF to RUE for HD access Psych: A&Ox3   Lab Results:  Recent Labs    08/25/20 1351 08/26/20 0432  WBC 16.0* 13.1*  HGB 8.4* 8.1*  HCT 24.6* 24.0*  PLT 139* 155   BMET Recent Labs    08/25/20 1351 08/26/20 0432  NA 133* 131*  K 4.3 4.0  CL 99 98  CO2 22 20*  GLUCOSE 105* 105*  BUN 48* 54*  CREATININE 9.38* 9.89*  CALCIUM 8.1* 8.2*   PT/INR Recent Labs    08/23/20 2040 08/25/20 0431  LABPROT 16.9* 15.2  INR 1.4* 1.3*   CMP     Component Value Date/Time   NA 131 (L) 08/26/2020 0432   K 4.0 08/26/2020 0432   CL 98  08/26/2020 0432   CO2 20 (L) 08/26/2020 0432   GLUCOSE 105 (H) 08/26/2020 0432   BUN 54 (H) 08/26/2020 0432   CREATININE 9.89 (H) 08/26/2020 0432   CREATININE 5.98 (H) 03/28/2017 1638   CALCIUM 8.2 (L) 08/26/2020 0432   PROT 5.0 (L) 08/26/2020 0432   ALBUMIN 2.3 (L) 08/26/2020 0432   AST 79 (H) 08/26/2020 0432   ALT 82 (H) 08/26/2020 0432   ALKPHOS 56 08/26/2020 0432   BILITOT 0.7 08/26/2020 0432   GFRNONAA 5 (L) 08/26/2020 0432   GFRAA 9 (L) 03/04/2019 0840   Lipase     Component Value Date/Time   LIPASE 26 08/23/2020 1331       Studies/Results: DG Abd 1 View  Result Date: 08/24/2020 CLINICAL DATA:  OG tube EXAM: ABDOMEN - 1 VIEW COMPARISON:  08/23/2020 FINDINGS: Esophageal tube tip and side port overlie the proximal to mid stomach. Radiopaque presumed packing material in left upper quadrant as before. Residual enteral contrast within the colon. Nonobstructed bowel-gas pattern. Consolidation at the left base with probable small effusion. IMPRESSION: Esophageal tube tip and side port overlie the proximal to mid stomach. Nonobstructed gas pattern. Electronically Signed   By: Donavan Foil M.D.   On: 08/24/2020 19:13   DG Chest Port 1 View  Result Date: 08/26/2020 CLINICAL DATA:  Respiratory failure EXAM: PORTABLE CHEST 1 VIEW COMPARISON:  08/25/2020 FINDINGS: Endotracheal  tube seen 3.8 cm above the carina. Nasogastric tube tip within the gastric fundus. Left internal jugular central venous catheter tip within the superior cavoatrial junction. Vascular stents noted within the right subclavian and right innominate veins. Persistent retrocardiac opacification and left-sided volume loss in keeping with left lower lobe collapse. The lungs are otherwise clear. No pneumothorax. Tiny left pleural effusion may be present. Cardiac size is within normal limits. IMPRESSION: Stable support lines and tubes. Persistent left lower lobe collapse. Possible tiny left pleural effusion. Electronically  Signed   By: Fidela Salisbury MD   On: 08/26/2020 06:38   DG Chest Port 1 View  Result Date: 08/25/2020 CLINICAL DATA:  Respiratory failure EXAM: PORTABLE CHEST 1 VIEW COMPARISON:  08/24/2020 FINDINGS: Endotracheal tube is seen 5.7 cm above the carina. Nasogastric tube extends into the upper abdomen. Left internal jugular central venous catheter tip noted within the superior cavoatrial junction. Vascular stents again noted within the expected right subclavian and innominate veins. Progressive left lower lobe collapse with increasing left-sided volume loss. Lungs are otherwise clear. No pneumothorax or pleural effusion. Cardiac size within normal limits. No acute bone abnormality. IMPRESSION: Stable support lines and tubes. Progressive left lower lobe collapse now with mild left-sided volume loss. Electronically Signed   By: Fidela Salisbury MD   On: 08/25/2020 05:02    Anti-infectives: Anti-infectives (From admission, onward)   Start     Dose/Rate Route Frequency Ordered Stop   08/24/20 1200  vancomycin (VANCOREADY) IVPB 500 mg/100 mL  Status:  Discontinued        500 mg 100 mL/hr over 60 Minutes Intravenous Every M-W-F (Hemodialysis) 08/23/20 1724 08/23/20 2039   08/24/20 1200  vancomycin (VANCOREADY) IVPB 500 mg/100 mL  Status:  Discontinued        500 mg 100 mL/hr over 60 Minutes Intravenous Every M-W-F (Hemodialysis) 08/23/20 2052 08/26/20 0826   08/23/20 2145  vancomycin (VANCOREADY) IVPB 1000 mg/200 mL  Status:  Discontinued        1,000 mg 200 mL/hr over 60 Minutes Intravenous  Once 08/23/20 2053 08/23/20 2055   08/23/20 2100  piperacillin-tazobactam (ZOSYN) IVPB 2.25 g        2.25 g 100 mL/hr over 30 Minutes Intravenous Every 8 hours 08/23/20 2052 08/28/20 2359   08/23/20 1730  vancomycin (VANCOREADY) IVPB 1000 mg/200 mL  Status:  Discontinued        1,000 mg 200 mL/hr over 60 Minutes Intravenous  Once 08/23/20 1723 08/23/20 2101   08/23/20 1730  piperacillin-tazobactam (ZOSYN) IVPB 2.25  g  Status:  Discontinued        2.25 g 100 mL/hr over 30 Minutes Intravenous Every 8 hours 08/23/20 1726 08/23/20 2039   08/23/20 1430  piperacillin-tazobactam (ZOSYN) IVPB 3.375 g  Status:  Discontinued        3.375 g 12.5 mL/hr over 240 Minutes Intravenous Every 12 hours 08/23/20 1427 08/23/20 1726   08/20/20 0415  amoxicillin-clavulanate (AUGMENTIN) 500-125 MG per tablet 500 mg  Status:  Discontinued        1 tablet Oral Daily 08/20/20 0329 08/21/20 1258   08/19/20 2345  Ampicillin-Sulbactam (UNASYN) 3 g in sodium chloride 0.9 % 100 mL IVPB  Status:  Discontinued        3 g 200 mL/hr over 30 Minutes Intravenous  Once 08/19/20 2333 08/20/20 0329     Assessment/Plan Assessment/Plan ESRD on HD HTN HLD CAD PVD 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 Lovena Neighbours), 08/11/2018 Cystoprostatectomy + resection bladder  augmentation + enterolysis ([facility], Dr. Zenia Resides) [place] with mesh (02/2019 Dr. Kieth Brightly)  thrombocytopenia PMH PE Hepatitis C   Acute on chronic anemia - transfuse as needed per primary.  GI Bleed requiring transfusion - CTA abdomen pelvis 3/28 with no evidence of GIB - s/p colonoscopy 3/28 Dr. Lyndel Safe; two colon polyps removed, diverticulosis noted, no bleeding; required intubated for procedure due to flash pulm edema - initial intubation attempt was in the esophagus. Esophogram negative for esophageal injury/leak. - Dr. Lyndel Safe performed bedside endoscopy 3/30 showing Grade I esophageal varices, Small HH with healing Mallory-Weiss tear. No active bleeding. Gastritis present.   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 Greer Pickerel S/p re-exploration, ex lap, washout, abdominal closure 3/31 Dr. Kalman Drape - POD#1, afebrile, remains on levophed - CCM weaning with plans to attempt extubation today after HD.  - ok for trickle TF today. Pt on TPN.  - SLP eval after extubation    FEN: NPO, TPN, trickle TF, HD today, will need SLP eval after extubation, pre-albumin 16.7 ID: Zosyn 3/29 >> Day #3 VTE: SCD's, chemical VTE held in the setting of resolving hemorrhagic shock on continued vasopressor support.  Foley: none, anuric at baseline Dispo: ICU, trickle TF, HD today, possible extubation per CCM     LOS: 6 days    Obie Dredge, [facility] Please see Amion for pager number during day hours 7:00am-4:30pm

## 2020-08-26 NOTE — Progress Notes (Signed)
MD paged regarding patient's HR sustaining 130's. EKG obtained showing sinus tachycardia. MD stating the HR is okay for now and just monitor. BP also dropped while dialysis was pulling fluid. MD gave order for neosynephrine if needed. Will continue to monitor.

## 2020-08-26 NOTE — Progress Notes (Signed)
Nutrition Follow-up  DOCUMENTATION CODES:   Non-severe (moderate) malnutrition in context of chronic illness  INTERVENTION:   - Continue TPN per Pharmacy to meet 100% of estimated needs  Initiate trickle tube feeds via OG tube: - Pivot 1.5 @ 10 ml/hr (240 ml/day)  When able, recommend advancing tube feeds to goal: - Pivot 1.5 @ 40 ml/hr (960 ml/day)  Recommended tube feeding regimen at goal provides 1440 kcal, 90 grams of protein, and 729 ml of H2O.   NUTRITION DIAGNOSIS:   Moderate Malnutrition related to chronic illness (ESRD on home HD, CHF) as evidenced by moderate fat depletion,severe muscle depletion.  Ongoing, being addressed via nutrition support  GOAL:   Patient will meet greater than or equal to 90% of their needs  Met via TPN at goal rate  MONITOR:   Vent status,Labs,Weight trends,TF tolerance,Skin,I & O's  REASON FOR ASSESSMENT:   Ventilator    ASSESSMENT:   65 year old male who presented to the ED on 3/25 with chest pain and SOB. PMH of ESRD on home HD, CAD, CHF, PVD, adenocarcinoma of the bladder s/p TURBT, anemia, thrombocytopenia, PE, hepatitis C.  3/28 - colonoscopy (findings of polyp and diverticulosis, no active bleeding), intubated during procedure due to flash pulmonary edema, later extubated 3/29 - hemorrhagic shock secondary to splenic rupture, transferred to ICU, intubated, s/p ex-lap with splenectomy and application of abdominal wound VAC 3/30 - EGD revealing esophageal varices, small hiatal hernia, healing Mallory-Weiss tear, gastritis 3/31 - s/p ex-lap with washout and closure  Consult received to initiate tube feeds at 10 ml/hr and do not advance. Pt with OG tube in place. TPN to advance to goal rate of 55 ml/hr today at 1800. This provides 1507 kcal and 85 grams of protein, meeting 100% of pt's estimated needs.  Pt off pressors this morning. Plan is for HD today.  EDW: 46.5 kg Admit weight: 49.1 kg Current weight: 57.3 kg  Patient  remains intubated on ventilator support MV: 8.8 L/min Temp (24hrs), Avg:98.4 F (36.9 C), Min:94.1 F (34.5 C), Max:99.86 F (37.7 C) BP (a-line): 137/69 MAP (a-line): 96  Drips: Fentanyl  Medications reviewed and include: SSI q 6 hours, IV protonix, TPN, IV abx  Labs reviewed: sodium 131, phosphorus 5.0, elevated LFTs, hemoglobin 8.1 CBG's: 80-112 x 24 hours  OGT output: 150 ml x 24 hours Abdominal JP drain: 45 ml x 24 hours I/O's: +14.1 L since admit  Diet Order:   Diet Order            Diet NPO time specified  Diet effective now                 EDUCATION NEEDS:   No education needs have been identified at this time  Skin:  Skin Assessment: Skin Integrity Issues: Incisions: abdomen  Last BM:  08/19/20  Height:   Ht Readings from Last 1 Encounters:  08/20/20 _0  (1.626 m)    Weight:   Wt Readings from Last 1 Encounters:  08/26/20 57.3 kg    BMI:  Body mass index is 21.68 kg/m.  Estimated Nutritional Needs:   Kcal:  1500  Protein:  80-95 grams  Fluid:  >/= 1.5 L    Gustavus Bryant, MS, RD, LDN Inpatient Clinical Dietitian Please see AMiON for contact information.

## 2020-08-26 NOTE — Progress Notes (Signed)
[facility] ROUNDING NOTE   Subjective:   Brief history: 65 year old gentleman end-stage renal disease history of adenocarcinoma of the bladder presented with GI bleed underwent colonoscopy 08/22/2020.  On 08/23/2020 developed acute abdominal pain and acute splenic rupture with large hemoperitoneum.  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  He is on the home dialysis and maintains a Monday Wednesday Friday schedule in hospital.  He did not receive dialysis 08/25/2020 due to no acute dialysis needs his dialysis will be scheduled for regular intermittent hemodialysis 08/26/2020  Blood pressure 120/64 pulse 115 temperature 99.6     30% FiO2 O2 sats 100%   IV Zosyn  Sodium 131 potassium 4 chloride 98 CO2 20 BUN 54 creatinine 9.89 glucose 105 phosphorus 5 magnesium 2 albumin 2.3 hemoglobin 8.1  Objective:  Vital signs in last 24 hours:  Temp:  [94.1 F (34.5 C)-99.86 F (37.7 C)] 99.68 F (37.6 C) (04/01 1113) Pulse Rate:  [66-117] 117 (04/01 1113) Resp:  [0-21] 21 (04/01 1113) BP: (84-120)/(26-64) 120/64 (04/01 1113) SpO2:  [90 %-100 %] 100 % (04/01 1113) Arterial Line BP: (82-144)/(44-78) 120/63 (04/01 1100) FiO2 (%):  [30 %] 30 % (04/01 1113) Weight:  [57.3 kg] 57.3 kg (04/01 0500)  Weight change:  Filed Weights   08/23/20 0300 08/24/20 0500 08/26/20 0500  Weight: 49.4 kg 51.5 kg 57.3 kg    Intake/Output: I/O last 3 completed shifts: In: 3342.6 [I.V.:3075.6; IV Piggyback:267] Out: 225 [Emesis/NG output:150; Drains:45; Blood:30]   Intake/Output this shift:  Total I/O In: 188.3 [I.V.:188.3] Out: -   General: Non-toxic appearing, nad  Heart: normal rate Lungs: bilateral chest rise, no iwob Abdomen: soft non-distended  Extremities: No LE edema  Dialysis Access: RUE AVF +bruit    Basic Metabolic Panel: Recent Labs  Lab 08/19/20 1916 08/20/20 0404 08/23/20 2040  08/24/20 0323 08/24/20 0342 08/24/20 0730 08/24/20 1010 08/25/20 0431 08/25/20 1351 08/26/20 0432  NA  --    < > 138 135   < > 136 135 132* 133* 131*  K  --    < > 3.7 4.1   < > 4.2 4.3 4.6 4.3 4.0  CL  --    < > 103 100  --   --   --  97* 99 98  CO2  --    < > 21* 22  --   --   --  22 22 20*  GLUCOSE  --    < > 207* 118*  --   --   --  116* 105* 105*  BUN  --    < > 32* 34*  --   --   --  44* 48* 54*  CREATININE  --    < > 6.52* 7.10*  --   --   --  8.64* 9.38* 9.89*  CALCIUM  --    < > 8.1* 8.1*  --   --   --  8.0* 8.1* 8.2*  MG 1.9  --   --  1.6*  --   --   --  2.1  --  2.0  PHOS  --   --   --  4.9*  --   --   --   --  6.1* 5.0*   < > = values in this interval not displayed.    Liver Function Tests: Recent Labs  Lab 08/21/20 0409 08/23/20 1454 08/24/20 0323 08/25/20 0431 08/25/20 1351  08/26/20 0432  AST 17 28 456* 136*  --  79*  ALT 11 13 332* 160*  --  82*  ALKPHOS 69 76 47 53  --  56  BILITOT 1.0 0.8 1.0 0.6  --  0.7  PROT 5.5* 5.8* 4.8* 4.8*  --  5.0*  ALBUMIN 3.0* 3.0* 2.6* 2.4* 2.4* 2.3*   Recent Labs  Lab 08/23/20 1331  LIPASE 26   No results for input(s): AMMONIA in the last 168 hours.  CBC: Recent Labs  Lab 08/23/20 1331 08/23/20 1454 08/23/20 2040 08/23/20 2355 08/24/20 0323 08/24/20 0342 08/24/20 1706 08/24/20 2351 08/25/20 0431 08/25/20 1351 08/26/20 0432  WBC 4.5   < > 2.4*  --  9.6  --   --   --  18.1* 16.0* 13.1*  NEUTROABS 3.1  --   --   --   --   --   --   --   --  12.6* 10.7*  HGB 4.6*   < > 9.7*   < > 9.6*   < > 9.3* 8.5* 8.5* 8.4* 8.1*  HCT 14.1*   < > 27.5*   < > 26.7*   < > 26.8* 24.9* 24.9* 24.6* 24.0*  MCV 95.3   < > 85.9  --  84.2  --   --   --  89.6 90.1 92.3  PLT 103*   < > 78*  78*  --  102*  --   --   --  128* 139* 155   < > = values in this interval not displayed.    Cardiac Enzymes: No results for input(s): CKTOTAL, CKMB, CKMBINDEX, TROPONINI in the last 168 hours.  BNP: Invalid input(s): POCBNP  CBG: Recent  Labs  Lab 08/25/20 1743 08/25/20 1925 08/25/20 2338 08/26/20 0345 08/26/20 0746  GLUCAP 90 109* 102* 104* 45    Microbiology: Results for orders placed or performed during the hospital encounter of 08/19/20  SARS CORONAVIRUS 2 (TAT 6-24 HRS) Nasopharyngeal Nasopharyngeal Swab     Status: None   Collection Time: 08/19/20  7:16 PM   Specimen: Nasopharyngeal Swab  Result Value Ref Range Status   SARS Coronavirus 2 NEGATIVE NEGATIVE Final    Comment: (NOTE) SARS-CoV-2 target nucleic acids are NOT DETECTED.  The SARS-CoV-2 RNA is generally detectable in upper and lower respiratory specimens during the acute phase of infection. Negative results do not preclude SARS-CoV-2 infection, do not rule out co-infections with other pathogens, and should not be used as the sole basis for treatment or other patient management decisions. Negative results must be combined with clinical observations, patient history, and epidemiological information. The expected result is Negative.  Fact Sheet for Patients: SugarRoll.be  Fact Sheet for Healthcare Providers: https://www.woods-mathews.com/  This test is not yet approved or cleared by the [place] FDA and  has been authorized for detection and/or diagnosis of SARS-CoV-2 by FDA under an Emergency Use Authorization (EUA). This EUA will remain  in effect (meaning this test can be used) for the duration of the COVID-19 declaration under Se ction 564(b)(1) of the Act, 21 U.S.C. section 360bbb-3(b)(1), unless the authorization is terminated or revoked sooner.  Performed at [facility], [place] [address], [place], [place] [address]   Blood culture (routine x 2)     Status: None   Collection Time: 08/20/20 12:21 AM   Specimen: BLOOD  Result Value Ref Range Status   Specimen Description BLOOD SITE NOT SPECIFIED  Final   Special Requests  Final    BOTTLES DRAWN AEROBIC AND ANAEROBIC Blood Culture  adequate volume   Culture   Final    NO GROWTH 5 DAYS Performed at [facility], [place] [address] [place], [place] [address]    Report Status 08/25/2020 FINAL  Final  Blood culture (routine x 2)     Status: None   Collection Time: 08/20/20  4:04 AM   Specimen: BLOOD  Result Value Ref Range Status   Specimen Description BLOOD SITE NOT SPECIFIED  Final   Special Requests AEROBIC BOTTLE ONLY Blood Culture adequate volume  Final   Culture   Final    NO GROWTH 5 DAYS Performed at [facility], [place] [address], [place], [place] [address]    Report Status 08/25/2020 FINAL  Final  MRSA PCR Screening     Status: None   Collection Time: 08/26/20  9:29 AM   Specimen: Nasal Mucosa; Nasopharyngeal  Result Value Ref Range Status   MRSA by PCR NEGATIVE NEGATIVE Final    Comment:        The GeneXpert MRSA Assay (FDA approved for NASAL specimens only), is one component of a comprehensive MRSA colonization surveillance program. It is not intended to diagnose MRSA infection nor to guide or monitor treatment for MRSA infections. Performed at [facility], [place] [address], [place], [place] [address]     Coagulation Studies: Recent Labs    08/23/20 1454 08/23/20 2035 08/23/20 2040 08/25/20 0431  LABPROT 17.2* 16.5* 16.9* 15.2  INR 1.5* 1.4* 1.4* 1.3*    Urinalysis: No results for input(s): COLORURINE, LABSPEC, PHURINE, GLUCOSEU, HGBUR, BILIRUBINUR, KETONESUR, PROTEINUR, UROBILINOGEN, NITRITE, LEUKOCYTESUR in the last 72 hours.  Invalid input(s): APPERANCEUR    Imaging: DG Abd 1 View  Result Date: 08/24/2020 CLINICAL DATA:  OG tube EXAM: ABDOMEN - 1 VIEW COMPARISON:  08/23/2020 FINDINGS: Esophageal tube tip and side port overlie the proximal to mid stomach. Radiopaque presumed packing material in left upper quadrant as before. Residual enteral contrast within the colon. Nonobstructed bowel-gas pattern. Consolidation at the left base with probable small effusion.  IMPRESSION: Esophageal tube tip and side port overlie the proximal to mid stomach. Nonobstructed gas pattern. Electronically Signed   By: Donavan Foil M.D.   On: 08/24/2020 19:13   DG Chest Port 1 View  Result Date: 08/26/2020 CLINICAL DATA:  Respiratory failure EXAM: PORTABLE CHEST 1 VIEW COMPARISON:  08/25/2020 FINDINGS: Endotracheal tube seen 3.8 cm above the carina. Nasogastric tube tip within the gastric fundus. Left internal jugular central venous catheter tip within the superior cavoatrial junction. Vascular stents noted within the right subclavian and right innominate veins. Persistent retrocardiac opacification and left-sided volume loss in keeping with left lower lobe collapse. The lungs are otherwise clear. No pneumothorax. Tiny left pleural effusion may be present. Cardiac size is within normal limits. IMPRESSION: Stable support lines and tubes. Persistent left lower lobe collapse. Possible tiny left pleural effusion. Electronically Signed   By: Fidela Salisbury MD   On: 08/26/2020 06:38   DG Chest Port 1 View  Result Date: 08/25/2020 CLINICAL DATA:  Respiratory failure EXAM: PORTABLE CHEST 1 VIEW COMPARISON:  08/24/2020 FINDINGS: Endotracheal tube is seen 5.7 cm above the carina. Nasogastric tube extends into the upper abdomen. Left internal jugular central venous catheter tip noted within the superior cavoatrial junction. Vascular stents again noted within the expected right subclavian and innominate veins. Progressive left lower lobe collapse with increasing left-sided volume loss. Lungs are otherwise clear. No  pneumothorax or pleural effusion. Cardiac size within normal limits. No acute bone abnormality. IMPRESSION: Stable support lines and tubes. Progressive left lower lobe collapse now with mild left-sided volume loss. Electronically Signed   By: Fidela Salisbury MD   On: 08/25/2020 05:02     Medications:   .  prismasol BGK 4/2.5 Stopped (08/25/20 1450)  .  prismasol BGK 4/2.5 Stopped  (08/25/20 1449)  . sodium chloride    . sodium chloride    . sodium chloride Stopped (08/25/20 2128)  . feeding supplement (PIVOT 1.5 CAL)    . fentaNYL infusion INTRAVENOUS 100 mcg/hr (08/26/20 1100)  . norepinephrine (LEVOPHED) Adult infusion Stopped (08/26/20 0615)  . piperacillin-tazobactam (ZOSYN)  IV Stopped (08/26/20 0527)  . prismasol BGK 4/2.5 Stopped (08/25/20 1450)  . TPN ADULT (ION) 30 mL/hr at 08/26/20 1100  . TPN ADULT (ION)     . sodium chloride   Intravenous Once  . sodium chloride   Intravenous Once  . chlorhexidine gluconate (MEDLINE KIT)  15 mL Mouth Rinse BID  . Chlorhexidine Gluconate Cloth  6 each Topical Q0600  . Chlorhexidine Gluconate Cloth  6 each Topical Q0600  . insulin aspart  0-9 Units Subcutaneous Q6H  . mouth rinse  15 mL Mouth Rinse 10 times per day  . pantoprazole  40 mg Intravenous Q12H  . sodium chloride flush  10-40 mL Intracatheter Q12H   sodium chloride, sodium chloride, sodium chloride, alteplase, fentaNYL, heparin, lidocaine (PF), lidocaine, lidocaine-prilocaine, midazolam, midazolam, ondansetron **OR** ondansetron (ZOFRAN) IV, pentafluoroprop-tetrafluoroeth, sodium chloride, sodium chloride flush  Assessment/ Plan:  Dialysis Orders: Nx Stage MTTF EDW 46.5kg  Assessment/Plan: 1. Splenic rupture: Went to the OR on 3/29.  Surgery assisting.  Anemia seems to be stabilizing 2. Melena/GIB/hemorrhagic shock-status post transfusions for possible diverticular bleed but now with massive bleeding related to splenic rupture.  Transfusions per primary. 3. ESRD -on home dialysis maintaining MWF schedule here.  No acute need for dialysis.     Improved hemodynamics will schedule for intermittent hemodialysis 08/26/2020 4. Anemia --transfusions and hemorrhage as above.  Received Aranesp [address] with HD 3/26.  5. Hypertension/volume - no BP meds while on pressors 6. Metabolic bone disease -Continue Auryxia, calcitriol, Sensipar when taking po.  7. Oral  abscess - now off Augmentin; oral hygiene      LOS: [place] _0 _1 :35 AM

## 2020-08-26 NOTE — Progress Notes (Signed)
PHARMACY - TOTAL PARENTERAL NUTRITION CONSULT NOTE  Indication:  Intolerance to EN  Patient Measurements: Height: 5' 4" (162.6 cm) Weight: 57.3 kg (126 lb 5.2 oz) IBW/kg (Calculated) : 59.2 TPN AdjBW (KG): 49.1 Body mass index is 21.68 kg/m.  Weight 45.5 kg on admit, now 51.5 kg d/t volume  Assessment:  64 YOM presented on 08/19/20 with chest pain and SOB.  Had hematochezia and CTA was negative for overt bleeding.  Colonoscopy on 3/28 showed diverticulosis and polyps that were resected.  Patient deteriorated on 3/29, requiring intubation and emergent ex-lap with splenectomy and wound VAC application for splenic rupture.  Abdomen left open. EGD on 3/30 revealed esophageal varices with small hiatal hernia and healing Mallory-Weiss tear.  Patient is currently in the OR for possible abdominal closure.  Pharmacy consulted for TPN management.  Patient has moderate PCM.  Glucose / Insulin: no hx DM - CBGs < 180.  No SSI usage yet. Electrolytes: low Na/CO2, Phos 5 (none in TPN), iCa low and CaGluc 1gm given on 3/30--CoCa WNL, others WNL Renal: ESRD on MWF HD, may need CRRT but came off pressor.  BUN 44 Hepatic: AST/ALT down to 79/82, alk phos / tbili / TG WNL.  Albumin 2.3, BL prealbumin 16.7 Intake / Output; MIVF: net +12.5L GI Imaging: none since TPN start GI Surgeries / Procedures:  3/31 OR - abd washout and closure  Central access: PICC placed 08/20/20, CVC single lumen placed 08/23/20 TPN start date: 08/25/20  Nutritional Goals (per RD rec on 3/31): 1500 kCal and 80-95gm AA per day  Current Nutrition:  TPN  Plan:  Advance concentrated TPN to goal rate 55 ml/hr at 1800 TPN will provide 85g AA and 1507 kCal, meeting 100% of patient needs Electrolytes in TPN: Na 188mq/L, add K 129m/L, add Mag 62m54mL, Ca 62mE33m, no Phos, max acetate Add standard MVI and trace elements to TPN.  Remove chromium d/t ESRD. Continue sensitive SSI Q6H Renal function panel BID per MD To start trickle feeding  per discussion with Surgery - start weaning TPN once TF can be advanced  Continue Zosyn 2.25gm IV Q8H through 4/3 Monitor dialysis plans  Logan Martin D. DangMina MarblearmD, BCPS, BCCCGlen [place]/2022, 10:24 AM

## 2020-08-26 NOTE — Progress Notes (Signed)
NAME:  Logan Martin, MRN:  [id number], DOB:  1956/05/25, LOS: 6 ADMISSION DATE:  08/19/2020, CONSULTATION DATE:  08/23/2020 REFERRING MD:  Dr. Waldron Labs, CHIEF COMPLAINT:  hypotension  History of Present Illness:  66 year old male with prior history of ESRD, HTN, HLD, CAD, history of adenocarcinoma of the bladder, anemia, thrombocytopenia, hx of PE, hepatitis C presenting on 3/25 with progressive fatigue and generalized weakness with dark stools for 1 week.   Work-up significant for anemia and melanotic stools.  CTA abdomen pelvis showed no active source of GI bleeding.  It did note focal areas of high-grade stenosis within distal branches of the SMA in the right lower quadrant. Due to poor IV access, a central line was placed by IR.  Nephrology and [facility] consulted.  Underwent colonoscopy on 3/28 which showed diverticulosis, 2 polyps were removed and sent for biopsy but showed no evidence of GI bleed.  Patient has required ongoing transfusion throughout hospitalization for ongoing anemia of unclear source.  On 3/29 patient acutely complained of worsening abdominal and chronic back pain, found to have splenic rupture, required massive transfusion and emergent OR  Pertinent  Medical History  ESRD (on home HD MTTF), HTN, HLD, CAD, history of adenocarcinoma of the bladder, anemia, thrombocytopenia, hx of PE, hepatitis C  Hard of hearing   Significant Hospital Events: Including procedures, antibiotic start and stop dates in addition to other pertinent events   . 3/25 admitted to [facility], nephrology following  . 3/26 L IJ DL CVL placed by IR (of note, unable to pass via right IJ due to occlusion and lower right IJ at venous stent) . 3/27 [facility] consulted . 3/28 colonoscopy by Dr. Lyndel Safe with two polyps removed, required intubation during procedure for flash pulmonary edema; extubated post procedure . 3/29 transfused 4th unit PRBC this am.  During afternoon, acute decompensation/ hypotension on  floor -> RRT, vasopresors started, stat CT abd/ pelvis and surgical consult -> intubated, then to OR with ruptured spleen s/p MTP and splenectomy . 3/30 EGD 3/30 - healing Mallory-Weiss tear, grade 1 esophageal varices, small hiatal hernia  . 3/31 OR >> washout & abd closure  Interim History / Subjective:   Hemodynamically stable, off vasopressin and Levophed this morning. Sedated on fentanyl drip Anuric  Objective   Blood pressure (!) 91/34, pulse 88, temperature 99.68 F (37.6 C), temperature source Esophageal, resp. rate 18, height 5\' 4"  (1.626 m), weight 57.3 kg, SpO2 97 %. CVP:  [1 mmHg-2 mmHg] 2 mmHg  Vent Mode: PRVC FiO2 (%):  [30 %] 30 % Set Rate:  [20 bmp] 20 bmp Vt Set:  [350 mL-470 mL] 470 mL PEEP:  [5 cmH20] 5 cmH20 Plateau Pressure:  [14 cmH20-16 cmH20] 16 cmH20   Intake/Output Summary (Last 24 hours) at 08/26/2020 6568 Last data filed at 08/26/2020 0600 Gross per 24 hour  Intake 2130.08 ml  Output 225 ml  Net 1905.08 ml   Filed Weights   08/23/20 0300 08/24/20 0500 08/26/20 0500  Weight: 49.4 kg 51.5 kg 57.3 kg   Examination: General:  Critically ill appearing male, NAD HEENT: MM pink/moist, oral ETT Neuro: Fentanyl drip being lowered, RASS 0 follows commands moves all 4 extremities CV: s1s2 rrr, no m/r/g PULM: No accessory muscle use clear breath sounds bilateral GI: soft, abdomen closed, JP drain with serosanguineous drainage Extremities: warm/dry, no sig edema  Skin: no rashes or lesions , anasarca  Chest x-ray independently reviewed which shows no new infiltrates, ET tube in position Labs  show mild hyponatremia, normal potassium, decreasing leukocytosis, stable anemia  Labs/imaging that I havepersonally reviewed  (right click and "Reselect all SmartList Selections" daily)  3/25 SARS/ flu >> neg 3/26 BCx >>ng  3/25 amoxicillin >> 3/26 3/29 zosyn >>  3/29 vanc >>4/1  3/26 L IJ CVL>> 3/29 ETT >> 3/29 R femoral Aline >> 3/29 L femoral cordis >>  4/1  3/26 CTA abd/ pelvis >> VASCULAR 1. No evidence of active gastrointestinal hemorrhage. 2. Extensive atherosclerosis of the aorta and its branches. Focal areas of high-grade stenosis within distal branches of the SMA in the right lower quadrant. High-grade bilateral renal artery stenosis, left greater than right NON-VASCULAR 1. Numerous bilateral hepatic cysts unchanged. 2. Bilateral renal cortical atrophy with diffuse cystic change, consistent with known end-stage renal disease. 3. No acute intra-abdominal or intrapelvic process. 4. Aortic Atherosclerosis and Emphysema   3/28 colonoscopy >> One 6 mm polyp in the proximal transverse colon, removed with a cold snare. Resected and retrieved.  One 6 mm polyp in the mid descending colon, removed with a cold snare. Resected and retrieved.  Diverticulosis in the sigmoid colon and in the ascending colon.  Non-bleeding internal hemorrhoids.  3/29 CTA a/p >> acute splenic rupture with large hemoperitoneum and moderate subscapsular splenic hematoma  Resolved Hospital Problem list    Assessment & Plan:   Shock- hemorraghic secondary to splenic rupture and hemoperitoneum s/p ex lap and splenectomy 3/29 s/p MTP.  Improving.  Off pressors PLAN  - Surgery following, monitor JP drain output - trending H/H, coags, transfuse for Hgb < 7 - continue Zosyn for 5 days, DC vancomycin Cherina  ABLA/ symptomatic anemia - acute decompensation 3/29 2/2 to splenic rupture, prior to that no evidence of active bleed causing symptomatic anemia by CTA a/p or colonoscopy - s/p MTP 3/29 (9 PRBC, 6 FFP, 1 platelet, 1 cryo ) w/ premedication given known antibodies; had 4 PRBC prior to MTP since admission PLAN  - Trend CBC - transfuse for Hgb < 7 - monitor coags and for delayed hemolysis  Acute hypoxic respiratory failure  PLAN -  Lower fentanyl for WA, SBT with goal extubation. Will wait on dialysis until extubation  ESRD on HD - Nephrology following   -Now off pressors and can get regular dialysis for fluid removal, electrolytes okay  Hypomag 1.6 iCa 1.02 -f/u chem    Best practice (right click and "Reselect all SmartList Selections" daily)  Diet:  NPO , TNA Pain/Anxiety/Delirium protocol (if indicated): RASS 0, fent gtt VAP protocol (if indicated): Yes DVT prophylaxis: Contraindicated, SCDs GI prophylaxis: PPI BID Glucose control:  SSI Yes Central venous access:  Yes, and it is still needed Arterial line:  Yes, and it is still needed Foley:  N/A anuric at baseline Mobility:  bed rest  PT consulted: N/A Last date of multidisciplinary goals of care discussion [ongoing] Code Status:  full code Disposition: ICU    Labs   CBC: Recent Labs  Lab 08/23/20 1331 08/23/20 1454 08/23/20 2040 08/23/20 2355 08/24/20 0323 08/24/20 0342 08/24/20 1706 08/24/20 2351 08/25/20 0431 08/25/20 1351 08/26/20 0432  WBC 4.5   < > 2.4*  --  9.6  --   --   --  18.1* 16.0* 13.1*  NEUTROABS 3.1  --   --   --   --   --   --   --   --  12.6* 10.7*  HGB 4.6*   < > 9.7*   < > 9.6*   < >  9.3* 8.5* 8.5* 8.4* 8.1*  HCT 14.1*   < > 27.5*   < > 26.7*   < > 26.8* 24.9* 24.9* 24.6* 24.0*  MCV 95.3   < > 85.9  --  84.2  --   --   --  89.6 90.1 92.3  PLT 103*   < > 78*  78*  --  102*  --   --   --  128* 139* 155   < > = values in this interval not displayed.    Basic Metabolic Panel: Recent Labs  Lab 08/19/20 1916 08/20/20 0404 08/23/20 2040 08/24/20 0323 08/24/20 0342 08/24/20 0730 08/24/20 1010 08/25/20 0431 08/25/20 1351 08/26/20 0432  NA  --    < > 138 135   < > 136 135 132* 133* 131*  K  --    < > 3.7 4.1   < > 4.2 4.3 4.6 4.3 4.0  CL  --    < > 103 100  --   --   --  97* 99 98  CO2  --    < > 21* 22  --   --   --  22 22 20*  GLUCOSE  --    < > 207* 118*  --   --   --  116* 105* 105*  BUN  --    < > 32* 34*  --   --   --  44* 48* 54*  CREATININE  --    < > 6.52* 7.10*  --   --   --  8.64* 9.38* 9.89*  CALCIUM  --    < > 8.1*  8.1*  --   --   --  8.0* 8.1* 8.2*  MG 1.9  --   --  1.6*  --   --   --  2.1  --  2.0  PHOS  --   --   --  4.9*  --   --   --   --  6.1* 5.0*   < > = values in this interval not displayed.   GFR: Estimated Creatinine Clearance: 6.1 mL/min (A) (by C-G formula based on SCr of 9.89 mg/dL (H)). Recent Labs  Lab 08/20/20 0404 08/21/20 0409 08/23/20 1400 08/23/20 1809 08/23/20 2040 08/24/20 0323 08/25/20 0431 08/25/20 1351 08/26/20 0432  WBC 4.8   < >  --  9.2   < > 9.6 18.1* 16.0* 13.1*  LATICACIDVEN 1.6  --  6.3* 6.3*  --  1.8  --   --   --    < > = values in this interval not displayed.    Liver Function Tests: Recent Labs  Lab 08/21/20 0409 08/23/20 1454 08/24/20 0323 08/25/20 0431 08/25/20 1351 08/26/20 0432  AST 17 28 456* 136*  --  79*  ALT 11 13 332* 160*  --  82*  ALKPHOS 69 76 47 53  --  56  BILITOT 1.0 0.8 1.0 0.6  --  0.7  PROT 5.5* 5.8* 4.8* 4.8*  --  5.0*  ALBUMIN 3.0* 3.0* 2.6* 2.4* 2.4* 2.3*   Recent Labs  Lab 08/23/20 1331  LIPASE 26   No results for input(s): AMMONIA in the last 168 hours.  ABG    Component Value Date/Time   PHART 7.486 (H) 08/24/2020 1010   PCO2ART 34.3 08/24/2020 1010   PO2ART 171 (H) 08/24/2020 1010   HCO3 25.9 08/24/2020 1010   TCO2 27 08/24/2020 1010   ACIDBASEDEF 5.0 (H) 08/23/2020 1750  O2SAT 100.0 08/24/2020 1010     Coagulation Profile: Recent Labs  Lab 08/19/20 1855 08/23/20 1454 08/23/20 2035 08/23/20 2040 08/25/20 0431  INR 1.1 1.5* 1.4* 1.4* 1.3*    Cardiac Enzymes: No results for input(s): CKTOTAL, CKMB, CKMBINDEX, TROPONINI in the last 168 hours.  HbA1C: No results found for: HGBA1C  CBG: Recent Labs  Lab 08/25/20 1743 08/25/20 1925 08/25/20 2338 08/26/20 0345 08/26/20 0746  GLUCAP 90 109* 102* 104* 97     Critical care time: 34 mins    Kara Mead MD. FCCP. [facility] Pager : [phone]  If no response to pager , please call [phone] until 7 pm After 7:00  pm call Elink  [phone]     08/26/2020  8:12 AM

## 2020-08-27 ENCOUNTER — Inpatient Hospital Stay (HOSPITAL_COMMUNITY): Payer: Medicare HMO

## 2020-08-27 DIAGNOSIS — R578 Other shock: Secondary | ICD-10-CM | POA: Diagnosis not present

## 2020-08-27 DIAGNOSIS — Z992 Dependence on renal dialysis: Secondary | ICD-10-CM | POA: Diagnosis not present

## 2020-08-27 DIAGNOSIS — N186 End stage renal disease: Secondary | ICD-10-CM | POA: Diagnosis not present

## 2020-08-27 DIAGNOSIS — J9601 Acute respiratory failure with hypoxia: Secondary | ICD-10-CM | POA: Diagnosis not present

## 2020-08-27 LAB — COMPREHENSIVE METABOLIC PANEL
ALT: 48 U/L — ABNORMAL HIGH (ref 0–44)
AST: 58 U/L — ABNORMAL HIGH (ref 15–41)
Albumin: 2.1 g/dL — ABNORMAL LOW (ref 3.5–5.0)
Alkaline Phosphatase: 53 U/L (ref 38–126)
Anion gap: 11 (ref 5–15)
BUN: 35 mg/dL — ABNORMAL HIGH (ref 8–23)
CO2: 24 mmol/L (ref 22–32)
Calcium: 8.2 mg/dL — ABNORMAL LOW (ref 8.9–10.3)
Chloride: 100 mmol/L (ref 98–111)
Creatinine, Ser: 6.58 mg/dL — ABNORMAL HIGH (ref 0.61–1.24)
GFR, Estimated: 9 mL/min — ABNORMAL LOW (ref >60)
Glucose, Bld: 117 mg/dL — ABNORMAL HIGH (ref 70–99)
Potassium: 2.9 mmol/L — ABNORMAL LOW (ref 3.5–5.1)
Sodium: 135 mmol/L (ref 135–145)
Total Bilirubin: 0.6 mg/dL (ref 0.3–1.2)
Total Protein: 4.9 g/dL — ABNORMAL LOW (ref 6.5–8.1)

## 2020-08-27 LAB — GLUCOSE, CAPILLARY
Glucose-Capillary: 110 mg/dL — ABNORMAL HIGH (ref 70–99)
Glucose-Capillary: 112 mg/dL — ABNORMAL HIGH (ref 70–99)
Glucose-Capillary: 116 mg/dL — ABNORMAL HIGH (ref 70–99)
Glucose-Capillary: 119 mg/dL — ABNORMAL HIGH (ref 70–99)
Glucose-Capillary: 77 mg/dL (ref 70–99)
Glucose-Capillary: 90 mg/dL (ref 70–99)
Glucose-Capillary: 97 mg/dL (ref 70–99)

## 2020-08-27 LAB — CBC
HCT: 22.2 % — ABNORMAL LOW (ref 39.0–52.0)
Hemoglobin: 7.4 g/dL — ABNORMAL LOW (ref 13.0–17.0)
MCH: 30.7 pg (ref 26.0–34.0)
MCHC: 33.3 g/dL (ref 30.0–36.0)
MCV: 92.1 fL (ref 80.0–100.0)
Platelets: 163 10*3/uL (ref 150–400)
RBC: 2.41 MIL/uL — ABNORMAL LOW (ref 4.22–5.81)
RDW: 16.4 % — ABNORMAL HIGH (ref 11.5–15.5)
WBC: 10.4 10*3/uL (ref 4.0–10.5)
nRBC: 9.3 % — ABNORMAL HIGH (ref 0.0–0.2)

## 2020-08-27 LAB — BASIC METABOLIC PANEL
Anion gap: 12 (ref 5–15)
BUN: 44 mg/dL — ABNORMAL HIGH (ref 8–23)
CO2: 26 mmol/L (ref 22–32)
Calcium: 8.8 mg/dL — ABNORMAL LOW (ref 8.9–10.3)
Chloride: 99 mmol/L (ref 98–111)
Creatinine, Ser: 7.28 mg/dL — ABNORMAL HIGH (ref 0.61–1.24)
GFR, Estimated: 8 mL/min — ABNORMAL LOW (ref >60)
Glucose, Bld: 131 mg/dL — ABNORMAL HIGH (ref 70–99)
Potassium: 3.7 mmol/L (ref 3.5–5.1)
Sodium: 137 mmol/L (ref 135–145)

## 2020-08-27 LAB — PHOSPHORUS: Phosphorus: 2.9 mg/dL (ref 2.5–4.6)

## 2020-08-27 LAB — MAGNESIUM: Magnesium: 2 mg/dL (ref 1.7–2.4)

## 2020-08-27 MED ORDER — POTASSIUM CHLORIDE 10 MEQ/100ML IV SOLN
10.0000 meq | INTRAVENOUS | Status: DC
  Administered 2020-08-27: 10 meq via INTRAVENOUS
  Filled 2020-08-27: qty 100

## 2020-08-27 MED ORDER — FENTANYL CITRATE (PF) 100 MCG/2ML IJ SOLN: 25.0000 ug | INTRAMUSCULAR | Status: DC | PRN

## 2020-08-27 MED ORDER — TRACE MINERALS CU-MN-SE-ZN 300-55-60-3000 MCG/ML IV SOLN
INTRAVENOUS | Status: AC
  Filled 2020-08-27: qty 567.6

## 2020-08-27 MED ORDER — AMLODIPINE BESYLATE 10 MG PO TABS
10.0000 mg | ORAL_TABLET | Freq: Every day | ORAL | Status: DC
  Administered 2020-08-29: 10 mg via ORAL
  Filled 2020-08-27: qty 1

## 2020-08-27 MED ORDER — POTASSIUM CHLORIDE 10 MEQ/100ML IV SOLN
10.0000 meq | INTRAVENOUS | Status: AC
  Administered 2020-08-27 (×2): 10 meq via INTRAVENOUS
  Filled 2020-08-27 (×2): qty 100

## 2020-08-27 MED ORDER — LABETALOL HCL 5 MG/ML IV SOLN
10.0000 mg | INTRAVENOUS | Status: DC | PRN
  Administered 2020-08-27: 10 mg via INTRAVENOUS
  Administered 2020-08-27 (×2): 20 mg via INTRAVENOUS
  Administered 2020-08-28: 10 mg via INTRAVENOUS
  Filled 2020-08-27 (×4): qty 4

## 2020-08-27 MED ORDER — MELATONIN 3 MG PO TABS: 3.0000 mg | ORAL_TABLET | Freq: Once | ORAL | Status: DC

## 2020-08-27 NOTE — Progress Notes (Signed)
During shift assessment... daughter notified that pt seems to be experiencing  ICU delirium. Sts pt is seeing things on the wall and hearing things.   Spoke w/pt and he did verb seeing things on the wall... Did tell him where he is pointing on the wall, there seems to be a stain which may be contributing to him seeing things on the wall... will let MD know.

## 2020-08-27 NOTE — Progress Notes (Addendum)
NAME:  Logan Martin, MRN:  [id number], DOB:  18-Nov-1955, LOS: 7 ADMISSION DATE:  08/19/2020, CONSULTATION DATE:  08/23/2020 REFERRING MD:  Dr. Waldron Labs, CHIEF COMPLAINT:  hypotension  History of Present Illness:  65 year old male with prior history of ESRD, HTN, HLD, CAD, history of adenocarcinoma of the bladder, anemia, thrombocytopenia, hx of PE, hepatitis C presenting on 3/25 with progressive fatigue and generalized weakness with dark stools for 1 week.   Work-up significant for anemia and melanotic stools.  CTA abdomen pelvis showed no active source of GI bleeding.  It did note focal areas of high-grade stenosis within distal branches of the SMA in the right lower quadrant. Due to poor IV access, a central line was placed by IR.  Nephrology and [facility] consulted.  Underwent colonoscopy on 3/28 which showed diverticulosis, 2 polyps were removed and sent for biopsy but showed no evidence of GI bleed.  Patient has required ongoing transfusion throughout hospitalization for ongoing anemia of unclear source.  On 3/29 patient acutely complained of worsening abdominal and chronic back pain, found to have splenic rupture, required massive transfusion and emergent OR  Pertinent  Medical History  ESRD (on home HD MTTF), HTN, HLD, CAD, history of adenocarcinoma of the bladder, anemia, thrombocytopenia, hx of PE, hepatitis C  Hard of hearing   Significant Hospital Events: Including procedures, antibiotic start and stop dates in addition to other pertinent events   . 3/25 admitted to [facility], nephrology following  . 3/26 L IJ DL CVL placed by IR (of note, unable to pass via right IJ due to occlusion and lower right IJ at venous stent) . 3/27 [facility] consulted . 3/28 colonoscopy by Dr. Lyndel Safe with two polyps removed, required intubation during procedure for flash pulmonary edema; extubated post procedure . 3/29 transfused 4th unit PRBC this am.  During afternoon, acute decompensation/ hypotension on  floor -> RRT, vasopresors started, stat CT abd/ pelvis and surgical consult -> intubated, then to OR with ruptured spleen s/p MTP and splenectomy . 3/30 EGD 3/30 - healing Mallory-Weiss tear, grade 1 esophageal varices, small hiatal hernia  . 3/31 OR >> washout & abd closure . 4/1off vasopressin and Levophed , hemodialysis, started tube feeds  Interim History / Subjective:   Remains off pressors, critically ill, intubated Got hypotensive on dialysis yesterday, 1 L pulled. Denies pain Afebrile  Objective   Blood pressure (!) 129/34, pulse 93, temperature 98.5 F (36.9 C), temperature source Axillary, resp. rate 19, height 5\' 4"  (1.626 m), weight 56.2 kg, SpO2 100 %.    Vent Mode: CPAP;PSV FiO2 (%):  [30 %] 30 % Set Rate:  [15 bmp] 15 bmp Vt Set:  [470 mL] 470 mL PEEP:  [5 cmH20] 5 cmH20 Pressure Support:  [8 cmH20] 8 cmH20 Plateau Pressure:  [13 cmH20-18 cmH20] 18 cmH20   Intake/Output Summary (Last 24 hours) at 08/27/2020 0844 Last data filed at 08/27/2020 0800 Gross per 24 hour  Intake 2028.95 ml  Output 3050 ml  Net -1021.05 ml   Filed Weights   08/26/20 0500 08/26/20 1315 08/27/20 0400  Weight: 57.3 kg 57.4 kg 56.2 kg   Examination: General:  Critically ill appearing male, NAD HEENT: MM pink/moist, oral ETT Neuro: RASS 0, anxious, follows commands, no focal deficits CV: s1s2 rrr, no m/r/g PULM: No accessory muscle use clear breath sounds bilateral GI: soft, abdomen closed, JP drain continues to have serosanguineous fluid Extremities: warm/dry, no sig edema  Skin: no rashes or lesions , anasarca  Chest  x-ray 4/1 did not show any new infiltrates or effusions Labs show hypokalemia, stable anemia, no leukocytosis  Labs/imaging that I havepersonally reviewed  (right click and "Reselect all SmartList Selections" daily)  3/25 SARS/ flu >> neg 3/26 BCx >>ng  3/25 amoxicillin >> 3/26 3/29 zosyn >>  3/29 vanc >>4/1  3/26 L IJ CVL>> 3/29 ETT >> 3/29 R femoral Aline  >> 3/29 L femoral cordis >> 4/1  3/26 CTA abd/ pelvis >> VASCULAR 1. No evidence of active gastrointestinal hemorrhage. 2. Extensive atherosclerosis of the aorta and its branches. Focal areas of high-grade stenosis within distal branches of the SMA in the right lower quadrant. High-grade bilateral renal artery stenosis, left greater than right NON-VASCULAR 1. Numerous bilateral hepatic cysts unchanged. 2. Bilateral renal cortical atrophy with diffuse cystic change, consistent with known end-stage renal disease. 3. No acute intra-abdominal or intrapelvic process. 4. Aortic Atherosclerosis and Emphysema   3/28 colonoscopy >> One 6 mm polyp in the proximal transverse colon, removed with a cold snare. Resected and retrieved.  One 6 mm polyp in the mid descending colon, removed with a cold snare. Resected and retrieved.  Diverticulosis in the sigmoid colon and in the ascending colon.  Non-bleeding internal hemorrhoids.  3/29 CTA a/p >> acute splenic rupture with large hemoperitoneum and moderate subscapsular splenic hematoma  Resolved Hospital Problem list    Assessment & Plan:   Shock- hemorraghic secondary to splenic rupture and hemoperitoneum s/p ex lap and splenectomy 3/29 s/p MTP.Off pressors PLAN  - Surgery following, monitor JP drain output, tolerating tube feeds, can dc TNA -Stable H/H, transfuse for Hgb < 7 - continue Zosyn for 5 days   ABLA/ symptomatic anemia - acute decompensation 3/29 2/2 to splenic rupture, prior to that no evidence of active bleed causing symptomatic anemia by CTA a/p or colonoscopy - s/p MTP 3/29 (9 PRBC, 6 FFP, 1 platelet, 1 cryo ) w/ premedication given known antibodies; had 4 PRBC prior to MTP since admission PLAN  -- transfuse for Hgb < 7 - monitor coags and for delayed hemolysis  Acute hypoxic respiratory failure  PLAN -  Tolerating spontaneous breathing trial, pressure support 5 5 and proceed with extubation  ESRD on HD - Nephrology following   -S/p HD 4/1  Daughter updated at bedside  Best practice (right click and "Reselect all SmartList Selections" daily)  Diet:  NPO , TNA Pain/Anxiety/Delirium protocol (if indicated): RASS 0, fent gtt VAP protocol (if indicated): Yes DVT prophylaxis: Contraindicated, SCDs GI prophylaxis: PPI BID Glucose control:  SSI Yes Central venous access:  Yes, and it is still needed Arterial line:  Yes, and it is still needed Foley:  N/A anuric at baseline Mobility:  bed rest  PT consulted: N/A Last date of multidisciplinary goals of care discussion 4/1 Code Status:  full code Disposition: ICU    Labs   CBC: Recent Labs  Lab 08/23/20 1331 08/23/20 1454 08/24/20 0323 08/24/20 0342 08/24/20 2351 08/25/20 0431 08/25/20 1351 08/26/20 0432 08/27/20 0423  WBC 4.5   < > 9.6  --   --  18.1* 16.0* 13.1* 10.4  NEUTROABS 3.1  --   --   --   --   --  12.6* 10.7*  --   HGB 4.6*   < > 9.6*   < > 8.5* 8.5* 8.4* 8.1* 7.4*  HCT 14.1*   < > 26.7*   < > 24.9* 24.9* 24.6* 24.0* 22.2*  MCV 95.3   < > 84.2  --   --  89.6 90.1 92.3 92.1  PLT 103*   < > 102*  --   --  128* 139* 155 163   < > = values in this interval not displayed.    Basic Metabolic Panel: Recent Labs  Lab 08/24/20 0323 08/24/20 0342 08/25/20 0431 08/25/20 1351 08/26/20 0432 08/26/20 1902 08/27/20 0423  NA 135   < > 132* 133* 131* 136 135  K 4.1   < > 4.6 4.3 4.0 3.2* 2.9*  CL 100  --  97* 99 98 101 100  CO2 22  --  22 22 20* 26 24  GLUCOSE 118*  --  116* 105* 105* 129* 117*  BUN 34*  --  44* 48* 54* 24* 35*  CREATININE 7.10*  --  8.64* 9.38* 9.89* 5.09* 6.58*  CALCIUM 8.1*  --  8.0* 8.1* 8.2* 8.6* 8.2*  MG 1.6*  --  2.1  --  2.0  --  2.0  PHOS 4.9*  --   --  6.1* 5.0* 3.3 2.9   < > = values in this interval not displayed.   GFR: Estimated Creatinine Clearance: 9 mL/min (A) (by C-G formula based on SCr of 6.58 mg/dL (H)). Recent Labs  Lab 08/23/20 1400 08/23/20 1809 08/23/20 2040 08/24/20 0323 08/25/20 0431  08/25/20 1351 08/26/20 0432 08/27/20 0423  WBC  --  9.2   < > 9.6 18.1* 16.0* 13.1* 10.4  LATICACIDVEN 6.3* 6.3*  --  1.8  --   --   --   --    < > = values in this interval not displayed.    Liver Function Tests: Recent Labs  Lab 08/23/20 1454 08/24/20 0323 08/25/20 0431 08/25/20 1351 08/26/20 0432 08/26/20 1902 08/27/20 0423  AST 28 456* 136*  --  79*  --  58*  ALT 13 332* 160*  --  82*  --  48*  ALKPHOS 76 47 53  --  56  --  53  BILITOT 0.8 1.0 0.6  --  0.7  --  0.6  PROT 5.8* 4.8* 4.8*  --  5.0*  --  4.9*  ALBUMIN 3.0* 2.6* 2.4* 2.4* 2.3* 2.4* 2.1*   Recent Labs  Lab 08/23/20 1331  LIPASE 26   No results for input(s): AMMONIA in the last 168 hours.  ABG    Component Value Date/Time   PHART 7.486 (H) 08/24/2020 1010   PCO2ART 34.3 08/24/2020 1010   PO2ART 171 (H) 08/24/2020 1010   HCO3 25.9 08/24/2020 1010   TCO2 27 08/24/2020 1010   ACIDBASEDEF 5.0 (H) 08/23/2020 1750   O2SAT 100.0 08/24/2020 1010     Coagulation Profile: Recent Labs  Lab 08/23/20 1454 08/23/20 2035 08/23/20 2040 08/25/20 0431  INR 1.5* 1.4* 1.4* 1.3*    Cardiac Enzymes: No results for input(s): CKTOTAL, CKMB, CKMBINDEX, TROPONINI in the last 168 hours.  HbA1C: No results found for: HGBA1C  CBG: Recent Labs  Lab 08/26/20 1933 08/26/20 2330 08/27/20 0345 08/27/20 0616 08/27/20 0736  GLUCAP 86 86 97 90 77     Critical care time: 32 mins    Kara Mead MD. FCCP. [place] [facility] Pager : [phone]  If no response to pager , please call [phone] until 7 pm After 7:00 pm call Elink  [phone]     08/27/2020  8:44 AM

## 2020-08-27 NOTE — Progress Notes (Signed)
[facility] Progress Note Patient Name: Logan Martin DOB: August 10, 1955 MRN: [id number]   Date of Service  08/27/2020  HPI/Events of Note  RN Shonna Chock requesting sleep medication for patient, he was extubated today  eICU Interventions  NPO except sips of water and ice  As per Gen surgery notes. S/p re exploration POD 2. Discussed with RN.  As per family, he seems confused. But as per RN, he is oriented and alert. Would avoid fenta push for sleep at this time.  Re assure and watch.         Intervention Category Intermediate Interventions: Other:  Elmer Sow 08/27/2020, 9:54 PM

## 2020-08-27 NOTE — Progress Notes (Signed)
eLink Physician-Brief Progress Note Patient Name: MILOS MILLIGAN DOB: 12-30-1955 MRN: [id number]   Date of Service  08/27/2020  HPI/Events of Note  Notified of K 2.9 ESRD on HD Ongoing TPN  eICU Interventions  Ordered K 10 meqs x 4 doses Will defer further replacements as per nephrology     Intervention Category Major Interventions: Electrolyte abnormality - evaluation and management  Judd Lien 08/27/2020, 6:54 AM

## 2020-08-27 NOTE — Procedures (Signed)
Extubation Procedure Note  Patient Details:   Name: ASIEL CHROSTOWSKI DOB: 17-Oct-1955 MRN: [id number]   Airway Documentation:    Vent end date: 08/27/20 Vent end time: 1010   Evaluation  O2 sats: stable throughout Complications: No apparent complications Patient did tolerate procedure well. Bilateral Breath Sounds: Clear,Diminished   Yes   Pt extubated to 4L [place] per MD order. Pt stable throughout however pt c/o "something on my cheek". Upon looking with flashlight it appears to just be an enlarged uvula and MD was called to bedside for inspection. Positive cuff leak noted prior to extubation. VS within normal limits. Pt has a weak productive cough and was encouraged to use [place] to clear secretions. RT will continue to monitor  Jesse Sans 08/27/2020, 10:24 AM

## 2020-08-27 NOTE — Progress Notes (Signed)
[facility] ROUNDING NOTE   Subjective:   Brief history: 65 year old gentleman end-stage renal disease history of adenocarcinoma of the bladder presented with GI bleed underwent colonoscopy 08/22/2020.  On 08/23/2020 developed acute abdominal pain and acute splenic rupture with large hemoperitoneum.  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  He is on the home dialysis and maintains a Monday Wednesday Friday schedule in hospital.   Underwent dialysis 08/26/2020 with 3 L removed.  Plan is for extubation  Blood pressure 155/96 pulse 95 temperature 98.5 O2 sats 100% FiO2 30%  Insulin sliding scale, Protonix 40 mg IV twice daily  IV Zosyn IV potassium chloride  Sodium 135 potassium 2.9 chloride 100 CO2 24 BUN 35 creatinine 6.58 glucose 117 calcium 8.2 phosphorus 2.9 magnesium 2 albumin 2.1 hemoglobin 7.4  Objective:  Vital signs in last 24 hours:  Temp:  [98.06 F (36.7 C)-99.68 F (37.6 C)] 98.5 F (36.9 C) (04/02 0800) Pulse Rate:  [86-131] 96 (04/02 0930) Resp:  [16-30] 19 (04/02 0757) BP: (86-163)/(34-96) 129/34 (04/02 0800) SpO2:  [93 %-100 %] 100 % (04/02 0930) Arterial Line BP: (95-178)/(45-96) 155/96 (04/02 0930) FiO2 (%):  [30 %] 30 % (04/02 0800) Weight:  [56.2 kg-57.4 kg] 56.2 kg (04/02 0400)  Weight change: 0.1 kg Filed Weights   08/26/20 0500 08/26/20 1315 08/27/20 0400  Weight: 57.3 kg 57.4 kg 56.2 kg    Intake/Output: I/O last 3 completed shifts: In: 2965.6 [I.V.:2530.5; NG/GT:185; IV Piggyback:250.1] Out: 3070 [Drains:70; Other:3000]   Intake/Output this shift:  Total I/O In: 191.5 [I.V.:123.7; NG/GT:10; IV Piggyback:57.7] Out: 15 [Drains:15]  General: Non-toxic appearing, nad  Heart: normal rate Lungs: bilateral chest rise, no iwob Abdomen: soft non-distended  Extremities: No LE edema  Dialysis Access: RUE AVF +bruit    Basic Metabolic Panel: Recent Labs   Lab 08/24/20 0323 08/24/20 0342 08/25/20 0431 08/25/20 1351 08/26/20 0432 08/26/20 1902 08/27/20 0423  NA 135   < > 132* 133* 131* 136 135  K 4.1   < > 4.6 4.3 4.0 3.2* 2.9*  CL 100  --  97* 99 98 101 100  CO2 22  --  22 22 20* 26 24  GLUCOSE 118*  --  116* 105* 105* 129* 117*  BUN 34*  --  44* 48* 54* 24* 35*  CREATININE 7.10*  --  8.64* 9.38* 9.89* 5.09* 6.58*  CALCIUM 8.1*  --  8.0* 8.1* 8.2* 8.6* 8.2*  MG 1.6*  --  2.1  --  2.0  --  2.0  PHOS 4.9*  --   --  6.1* 5.0* 3.3 2.9   < > = values in this interval not displayed.    Liver Function Tests: Recent Labs  Lab 08/23/20 1454 08/24/20 0323 08/25/20 0431 08/25/20 1351 08/26/20 0432 08/26/20 1902 08/27/20 0423  AST 28 456* 136*  --  79*  --  58*  ALT 13 332* 160*  --  82*  --  48*  ALKPHOS 76 47 53  --  56  --  53  BILITOT 0.8 1.0 0.6  --  0.7  --  0.6  PROT 5.8* 4.8* 4.8*  --  5.0*  --  4.9*  ALBUMIN 3.0* 2.6* 2.4* 2.4* 2.3* 2.4* 2.1*   Recent Labs  Lab 08/23/20 1331  LIPASE 26   No results for input(s): AMMONIA in the last 168 hours.  CBC: Recent Labs  Lab 08/23/20 1331 08/23/20 1454  08/24/20 0323 08/24/20 0342 08/24/20 2351 08/25/20 0431 08/25/20 1351 08/26/20 0432 08/27/20 0423  WBC 4.5   < > 9.6  --   --  18.1* 16.0* 13.1* 10.4  NEUTROABS 3.1  --   --   --   --   --  12.6* 10.7*  --   HGB 4.6*   < > 9.6*   < > 8.5* 8.5* 8.4* 8.1* 7.4*  HCT 14.1*   < > 26.7*   < > 24.9* 24.9* 24.6* 24.0* 22.2*  MCV 95.3   < > 84.2  --   --  89.6 90.1 92.3 92.1  PLT 103*   < > 102*  --   --  128* 139* 155 163   < > = values in this interval not displayed.    Cardiac Enzymes: No results for input(s): CKTOTAL, CKMB, CKMBINDEX, TROPONINI in the last 168 hours.  BNP: Invalid input(s): POCBNP  CBG: Recent Labs  Lab 08/26/20 1933 08/26/20 2330 08/27/20 0345 08/27/20 0616 08/27/20 0736  GLUCAP 86 86 97 90 77    Microbiology: Results for orders placed or performed during the hospital encounter of  08/19/20  SARS CORONAVIRUS 2 (TAT 6-24 HRS) Nasopharyngeal Nasopharyngeal Swab     Status: None   Collection Time: 08/19/20  7:16 PM   Specimen: Nasopharyngeal Swab  Result Value Ref Range Status   SARS Coronavirus 2 NEGATIVE NEGATIVE Final    Comment: (NOTE) SARS-CoV-2 target nucleic acids are NOT DETECTED.  The SARS-CoV-2 RNA is generally detectable in upper and lower respiratory specimens during the acute phase of infection. Negative results do not preclude SARS-CoV-2 infection, do not rule out co-infections with other pathogens, and should not be used as the sole basis for treatment or other patient management decisions. Negative results must be combined with clinical observations, patient history, and epidemiological information. The expected result is Negative.  Fact Sheet for Patients: SugarRoll.be  Fact Sheet for Healthcare Providers: https://www.woods-mathews.com/  This test is not yet approved or cleared by the [place] FDA and  has been authorized for detection and/or diagnosis of SARS-CoV-2 by FDA under an Emergency Use Authorization (EUA). This EUA will remain  in effect (meaning this test can be used) for the duration of the COVID-19 declaration under Se ction 564(b)(1) of the Act, 21 U.S.C. section 360bbb-3(b)(1), unless the authorization is terminated or revoked sooner.  Performed at [facility], [place] [address] [place], [place] [address]   Blood culture (routine x 2)     Status: None   Collection Time: 08/20/20 12:21 AM   Specimen: BLOOD  Result Value Ref Range Status   Specimen Description BLOOD SITE NOT SPECIFIED  Final   Special Requests   Final    BOTTLES DRAWN AEROBIC AND ANAEROBIC Blood Culture adequate volume   Culture   Final    NO GROWTH 5 DAYS Performed at [facility], [address], [place], [place] [address]    Report Status 08/25/2020 FINAL  Final  Blood culture (routine x 2)      Status: None   Collection Time: 08/20/20  4:04 AM   Specimen: BLOOD  Result Value Ref Range Status   Specimen Description BLOOD SITE NOT SPECIFIED  Final   Special Requests AEROBIC BOTTLE ONLY Blood Culture adequate volume  Final   Culture   Final    NO GROWTH 5 DAYS Performed at [facility], [place] [address] [place], [place] [address]    Report Status  08/25/2020 FINAL  Final  MRSA PCR Screening     Status: None   Collection Time: 08/26/20  9:29 AM   Specimen: Nasal Mucosa; Nasopharyngeal  Result Value Ref Range Status   MRSA by PCR NEGATIVE NEGATIVE Final    Comment:        The GeneXpert MRSA Assay (FDA approved for NASAL specimens only), is one component of a comprehensive MRSA colonization surveillance program. It is not intended to diagnose MRSA infection nor to guide or monitor treatment for MRSA infections. Performed at [facility], [place] [address] [place],  [address]     Coagulation Studies: Recent Labs    08/25/20 0431  LABPROT 15.2  INR 1.3*    Urinalysis: No results for input(s): COLORURINE, LABSPEC, PHURINE, GLUCOSEU, HGBUR, BILIRUBINUR, KETONESUR, PROTEINUR, UROBILINOGEN, NITRITE, LEUKOCYTESUR in the last 72 hours.  Invalid input(s): APPERANCEUR    Imaging: DG Chest Port 1 View  Result Date: 08/26/2020 CLINICAL DATA:  Respiratory failure EXAM: PORTABLE CHEST 1 VIEW COMPARISON:  08/25/2020 FINDINGS: Endotracheal tube seen 3.8 cm above the carina. Nasogastric tube tip within the gastric fundus. Left internal jugular central venous catheter tip within the superior cavoatrial junction. Vascular stents noted within the right subclavian and right innominate veins. Persistent retrocardiac opacification and left-sided volume loss in keeping with left lower lobe collapse. The lungs are otherwise clear. No pneumothorax. Tiny left pleural effusion may be present. Cardiac size is within normal limits. IMPRESSION: Stable support lines and tubes.  Persistent left lower lobe collapse. Possible tiny left pleural effusion. Electronically Signed   By: Fidela Salisbury MD   On: 08/26/2020 06:38     Medications:   . sodium chloride    . sodium chloride    . sodium chloride Stopped (08/25/20 2128)  . piperacillin-tazobactam (ZOSYN)  IV Stopped (08/27/20 1173)  . potassium chloride    . TPN ADULT (ION) 55 mL/hr at 08/27/20 0900   . sodium chloride   Intravenous Once  . sodium chloride   Intravenous Once  . chlorhexidine gluconate (MEDLINE KIT)  15 mL Mouth Rinse BID  . Chlorhexidine Gluconate Cloth  6 each Topical Q0600  . Chlorhexidine Gluconate Cloth  6 each Topical Q0600  . insulin aspart  0-9 Units Subcutaneous Q6H  . mouth rinse  15 mL Mouth Rinse 10 times per day  . pantoprazole  40 mg Intravenous Q12H  . sodium chloride flush  10-40 mL Intracatheter Q12H   sodium chloride, sodium chloride, sodium chloride, alteplase, fentaNYL (SUBLIMAZE) injection, heparin, lidocaine (PF), lidocaine, lidocaine-prilocaine, ondansetron **OR** ondansetron (ZOFRAN) IV, pentafluoroprop-tetrafluoroeth, sodium chloride, sodium chloride flush  Assessment/ Plan:  Dialysis Orders: Nx Stage MTTF EDW 46.5kg  Assessment/Plan: 1. Splenic rupture: Went to the OR on 3/29.  Surgery assisting.  Anemia seems to be stabilizing 2. Melena/GIB/hemorrhagic shock-status post transfusions for possible diverticular bleed but now with massive bleeding related to splenic rupture.  Transfusions per primary. 3. ESRD -on home dialysis maintaining MWF.  Underwent dialysis 08/26/2020.  Will follow closely over the weekend for any acute dialysis needs. 4. Anemia --transfusions and hemorrhage as above.  Received Aranesp 60 with HD 3/26.  5. Hypertension/volume - no BP meds while on pressors 6. Metabolic bone disease -Continue Auryxia, calcitriol, Sensipar when taking po.  7. Oral abscess -oral hygiene   IV Zosyn    LOS: [place] @TODAY @9 :46 AM

## 2020-08-27 NOTE — Progress Notes (Signed)
Patient ID: Logan Martin, male   DOB: 27-May-1956, 65 y.o.   MRN: [id number]   Acute Care Surgery Service Progress Note:    Chief Complaint/Subjective: Extubated this am C/o nausea Spitting up some Daughter at [facility]  Objective: Vital signs in last 24 hours: Temp:  [98.06 F (36.7 C)-99.68 F (37.6 C)] 98.5 F (36.9 C) (04/02 0800) Pulse Rate:  [86-131] 96 (04/02 0930) Resp:  [16-30] 19 (04/02 0757) BP: (86-163)/(34-96) 129/34 (04/02 0800) SpO2:  [93 %-100 %] 100 % (04/02 0930) Arterial Line BP: (95-178)/(45-96) 155/96 (04/02 0930) FiO2 (%):  [30 %] 30 % (04/02 0800) Weight:  [56.2 kg-57.4 kg] 56.2 kg (04/02 0400) Last BM Date: 08/19/20  Intake/Output from previous day: 04/01 0701 - 04/02 0700 In: 2022.7 [I.V.:1687.7; NG/GT:185; IV Piggyback:150.1] Out: 3035 [Drains:35] Intake/Output this shift: Total I/O In: 201.5 [I.V.:133.7; NG/GT:10; IV Piggyback:57.7] Out: 15 [Drains:15]  Lungs:  Nonlabored, some congestion  Cardiovascular: reg  Abd: soft, mild distension, approp TTP, drain-serosang  Extremities: no edema, +SCDs  Neuro: alert, nonfocal  Lab Results: CBC  Recent Labs    08/26/20 0432 08/27/20 0423  WBC 13.1* 10.4  HGB 8.1* 7.4*  HCT 24.0* 22.2*  PLT 155 163   BMET Recent Labs    08/26/20 1902 08/27/20 0423  NA 136 135  K 3.2* 2.9*  CL 101 100  CO2 26 24  GLUCOSE 129* 117*  BUN 24* 35*  CREATININE 5.09* 6.58*  CALCIUM 8.6* 8.2*   LFT Hepatic Function Latest Ref Rng & Units 08/27/2020 08/26/2020 08/26/2020  Total Protein 6.5 - 8.1 g/dL 4.9(L) - 5.0(L)  Albumin 3.5 - 5.0 g/dL 2.1(L) 2.4(L) 2.3(L)  AST 15 - 41 U/L 58(H) - 79(H)  ALT 0 - 44 U/L 48(H) - 82(H)  Alk Phosphatase 38 - 126 U/L 53 - 56  Total Bilirubin 0.3 - 1.2 mg/dL 0.6 - 0.7  Bilirubin, Direct 0.0 - 0.2 mg/dL - - -   PT/INR Recent Labs    08/25/20 0431  LABPROT 15.2  INR 1.3*   ABG No results for input(s): PHART, HCO3 in the last 72 hours.  Invalid input(s): PCO2,  PO2  Studies/Results:  Anti-infectives: Anti-infectives (From admission, onward)   Start     Dose/Rate Route Frequency Ordered Stop   08/24/20 1200  vancomycin (VANCOREADY) IVPB 500 mg/100 mL  Status:  Discontinued        500 mg 100 mL/hr over 60 Minutes Intravenous Every M-W-F (Hemodialysis) 08/23/20 1724 08/23/20 2039   08/24/20 1200  vancomycin (VANCOREADY) IVPB 500 mg/100 mL  Status:  Discontinued        500 mg 100 mL/hr over 60 Minutes Intravenous Every M-W-F (Hemodialysis) 08/23/20 2052 08/26/20 0826   08/23/20 2145  vancomycin (VANCOREADY) IVPB 1000 mg/200 mL  Status:  Discontinued        1,000 mg 200 mL/hr over 60 Minutes Intravenous  Once 08/23/20 2053 08/23/20 2055   08/23/20 2100  piperacillin-tazobactam (ZOSYN) IVPB 2.25 g        2.25 g 100 mL/hr over 30 Minutes Intravenous Every 8 hours 08/23/20 2052 08/28/20 2359   08/23/20 1730  vancomycin (VANCOREADY) IVPB 1000 mg/200 mL  Status:  Discontinued        1,000 mg 200 mL/hr over 60 Minutes Intravenous  Once 08/23/20 1723 08/23/20 2101   08/23/20 1730  piperacillin-tazobactam (ZOSYN) IVPB 2.25 g  Status:  Discontinued        2.25 g 100 mL/hr over 30 Minutes Intravenous Every 8 hours 08/23/20 1726  08/23/20 2039   08/23/20 1430  piperacillin-tazobactam (ZOSYN) IVPB 3.375 g  Status:  Discontinued        3.375 g 12.5 mL/hr over 240 Minutes Intravenous Every 12 hours 08/23/20 1427 08/23/20 1726   08/20/20 0415  amoxicillin-clavulanate (AUGMENTIN) 500-125 MG per tablet 500 mg  Status:  Discontinued        1 tablet Oral Daily 08/20/20 0329 08/21/20 1258   08/19/20 2345  Ampicillin-Sulbactam (UNASYN) 3 g in sodium chloride 0.9 % 100 mL IVPB  Status:  Discontinued        3 g 200 mL/hr over 30 Minutes Intravenous  Once 08/19/20 2333 08/20/20 0329      Medications: Scheduled Meds: . sodium chloride   Intravenous Once  . sodium chloride   Intravenous Once  . chlorhexidine gluconate (MEDLINE KIT)  15 mL Mouth Rinse BID  .  Chlorhexidine Gluconate Cloth  6 each Topical Q0600  . Chlorhexidine Gluconate Cloth  6 each Topical Q0600  . insulin aspart  0-9 Units Subcutaneous Q6H  . mouth rinse  15 mL Mouth Rinse 10 times per day  . pantoprazole  40 mg Intravenous Q12H  . sodium chloride flush  10-40 mL Intracatheter Q12H   Continuous Infusions: . sodium chloride    . sodium chloride    . sodium chloride Stopped (08/25/20 2128)  . piperacillin-tazobactam (ZOSYN)  IV Stopped (08/27/20 0272)  . potassium chloride 10 mEq (08/27/20 1006)  . TPN ADULT (ION) 55 mL/hr at 08/27/20 0900  . TPN ADULT (ION)     PRN Meds:.sodium chloride, sodium chloride, sodium chloride, alteplase, fentaNYL (SUBLIMAZE) injection, heparin, lidocaine (PF), lidocaine, lidocaine-prilocaine, ondansetron **OR** ondansetron (ZOFRAN) IV, pentafluoroprop-tetrafluoroeth, sodium chloride, sodium chloride flush  Assessment/Plan: Patient Active Problem List   Diagnosis Date Noted  . Abdominal pain   . Acute respiratory failure ([place])   . Tachycardia 08/20/2020  . Oral infection 08/20/2020  . Non-ST elevation (NSTEMI) myocardial infarction ([place])   . ESRD (end stage renal disease) on dialysis ([place]) 05/05/2020  . Prolonged QT interval 05/05/2020  . Allergy, unspecified, sequela 03/18/2020  . Pain, unspecified 03/18/2020  . Personal history of anaphylaxis 03/18/2020  . Cellulitis 03/10/2020  . Chronic low back pain   . Peripheral vascular disease ([place])   . Other mechanical complication of surgically created arteriovenous fistula, initial encounter ([place]) 03/09/2020  . Unspecified open wound of right upper arm, subsequent encounter 03/09/2020  . Hypercalcemia 01/20/2020  . Other disorders of phosphorus metabolism 01/01/2020  . Severe mitral regurgitation 12/22/2018  . Hypercholesterolemia 12/22/2018  . History of bladder cancer 08/14/2018  . Personal history of pulmonary embolism 08/14/2018  . Red blood cell antibody positive, compatible PRBC  difficult to obtain 08/14/2018  . Malignant neoplasm of overlapping sites of bladder ([place]) 07/20/2018  . Volume overload 06/18/2018  . Fluid overload 06/17/2018  . Elevated troponin 06/17/2018  . Hematuria 03/27/2018  . Hypokalemia 03/27/2018  . Thrombocytopenia ([place]) 03/27/2018  . Anemia in ESRD (end-stage renal disease) ([place]) 03/27/2018  . Bladder tumor 03/27/2018  . Encounter for adequacy testing for peritoneal dialysis ([place]) 01/03/2018  . 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 Compression of vein 11/12/2017  . Hypertension with heart disease 10/14/2017  . Pyelocystitis 10/13/2017  . 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 Cramp and spasm 02/20/2017  . Acidosis 02/18/2017  . Dependence on renal dialysis ([place]) 02/18/2017  . Hypertensive heart and chronic kidney disease with heart failure and with stage 5 chronic kidney disease, or end stage renal disease (  [place]) 02/18/2017  . Iron deficiency anemia, unspecified 02/18/2017  . Moderate protein-calorie malnutrition ([place]) 02/18/2017  . Other dietary vitamin B12 deficiency anemia 02/18/2017  . Other disorders of electrolyte and fluid balance, not elsewhere classified 02/18/2017  . Other disorders resulting from impaired renal tubular function 02/18/2017  . Personal history of nicotine dependence 02/18/2017  . Cardiomegaly 01/21/2017  . Congestive heart failure ([place]) 01/21/2017  . Pancytopenia ([place]) 06/14/2016  . Left arm pain 06/14/2016  . 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 Mechanical complication of other vascular device, implant, and graft 08/10/2013  . End stage renal disease ([place]) 08/10/2013  . ESRD (end stage renal disease) ([place]) 06/07/2013  . Nausea and vomiting 06/07/2013  . SOB (shortness of breath) 05/10/2013  . Chest discomfort 11/27/2012  . Shortness of breath 11/27/2012  . Chronic hepatitis C without hepatic coma ([place]) 02/04/2012  . Secondary hyperparathyroidism (of renal origin) 02/04/2012  . Anemia  associated with chronic renal failure 02/04/2012  . CAD (coronary atherosclerotic disease) 02/04/2012  . Pulmonary embolism ([place]) 07/20/2010  . Chronic pain syndrome 06/08/2010  . Renal failure 04/26/2010  . Atherosclerosis of native coronary artery of native heart without angina pectoris 02/23/2009  . HEMORRHOIDS, INTERNAL 02/23/2009  . GERD 02/23/2009  . ESRD on dialysis ([place]) 02/16/2009  . Allergic rhinitis 05/06/2008   ESRD on HD HTN HLD CAD PVD 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 Lovena Neighbours), 3/16/2020Cystoprostatectomy + resection bladder augmentation + enterolysis([facility], Dr. Zenia Resides) [place] with mesh (02/2019 Dr. Kieth Brightly)  thrombocytopenia PMH PE Hepatitis C   Acute on chronic anemia - transfuse as needed per primary. hgb down slightly  GI Bleed requiring transfusion - CTA abdomen pelvis 3/28 with no evidence of GIB - s/p colonoscopy 3/28 Dr. Lyndel Safe; two colon polyps removed, diverticulosis noted, no bleeding; required intubated for procedure due to flash pulm edema - initial intubation attempt was in the esophagus. Esophogram negative for esophageal injury/leak. - Dr. Lyndel Safe performed bedside endoscopy 3/30 showing Grade I esophageal varices, Small HH with healing Mallory-Weiss tear. No active bleeding. Gastritis present.   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 Greer Pickerel S/p re-exploration, ex lap, washout, abdominal closure 3/31 Dr. Kalman Drape - POD#2, afebrile, off levophed - drain serosang  FEN: NPO except ice, pre-albumin 16.7 ID: Zosyn 3/29 >> Day #4 VTE: SCD's, chemical VTE held in the setting of resolving hemorrhagic shock on continued vasopressor support.  Foley: none, anuric at baseline Dispo: at risk for ileus. Check abd xray. Npo except ice/sips of water. Daily cbc. Doesn't need abx for abdominal coverage. Consider checking drain lipase if  develops ileus.    LOS: 7 days    Leighton Ruff. Redmond Pulling, MD, FACS General, Bariatric, & Minimally Invasive Surgery [phone] [facility]

## 2020-08-27 NOTE — Progress Notes (Signed)
PHARMACY - TOTAL PARENTERAL NUTRITION CONSULT NOTE  Indication:  Intolerance to EN  Patient Measurements: Height: _0  (162.6 cm) Weight: 56.2 kg (123 lb 14.4 oz) IBW/kg (Calculated) : 59.2 TPN AdjBW (KG): 49.1 Body mass index is 21.27 kg/m.  Weight 45.5 kg on admit, now 51.5 kg d/t volume  Assessment:  64 YOM presented on 08/19/20 with chest pain and SOB.  Had hematochezia and CTA was negative for overt bleeding.  Colonoscopy on 3/28 showed diverticulosis and polyps that were resected.  Patient deteriorated on 3/29, requiring intubation and emergent ex-lap with splenectomy and wound VAC application for splenic rupture.  Abdomen left open. EGD on 3/30 revealed esophageal varices with small hiatal hernia and healing Mallory-Weiss tear.  Patient is currently in the OR for possible abdominal closure.  Pharmacy consulted for TPN management.  Patient has moderate PCM.  Glucose / Insulin: no hx DM - CBGs < 180.  No SSI usage yet. Electrolytes: NA 135, K 2.9. Phos 2.9 << 5, Mg 2, CoCa 9.6, Cl/HCO3 wnl.  Renal: ESRD on MWF HD - last 4/1 (3.5h BFR 400), BUN 35 Hepatic: AST/ALT down to 58/48, alk phos / tbili / TG WNL.  Albumin 2.1, BL prealbumin 16.7 Intake / Output; MIVF: Trickle feeds started 4/1 at 10 cc/hr - now off for planned extubation 4/2 followed by SLP eval and diet per CCM,  net +12.5L GI Imaging: none since TPN start GI Surgeries / Procedures:  3/31 OR - abd washout and closure  Central access: PICC placed 08/20/20, CVC single lumen placed 08/23/20 TPN start date: 08/25/20  Nutritional Goals (per RD rec on 4/1): 1500 kCal and 80-95gm AA per day  Current Nutrition:  TPN Pivot 1.5 @ 10 cc/hr - held for planned extubation  Plan:  Continue concentrated TPN to goal rate 55 ml/hr at 1800 TPN will provide 85g AA and 1507 kCal, meeting 100% of patient needs Electrolytes in TPN: incr Na to 110 mEq/L, incr K to 20 mEq/L, cont Mag 45mq/L, cont Ca 768m/L, no Phos, adjust Cl:Ac 1:2 Add  standard MVI and trace elements to TPN.  Remove chromium d/t ESRD. Continue sensitive SSI Q6H Renal function panel BID per MD - recheck BMET at 1200 for K Will f/u diet advancement after extubation and SLP eval for TPN wean  Continue Zosyn 2.25gm IV Q8H through 4/3 Monitor dialysis plans  Thank you for allowing pharmacy to be a part of this patient's care.  ElAlycia RossettiPharmD, BCPS Clinical Pharmacist Clinical phone for 08/27/2020: [phone]/06/2020 7:28 AM   **Pharmacist phone directory can now be found on [email or web address] ([email or web address]).  Listed under [facility]

## 2020-08-28 ENCOUNTER — Encounter (HOSPITAL_COMMUNITY): Payer: Self-pay | Admitting: *Deleted

## 2020-08-28 ENCOUNTER — Encounter (HOSPITAL_COMMUNITY): Payer: Self-pay

## 2020-08-28 DIAGNOSIS — R578 Other shock: Secondary | ICD-10-CM | POA: Diagnosis not present

## 2020-08-28 DIAGNOSIS — N186 End stage renal disease: Secondary | ICD-10-CM | POA: Diagnosis not present

## 2020-08-28 DIAGNOSIS — J9601 Acute respiratory failure with hypoxia: Secondary | ICD-10-CM | POA: Diagnosis not present

## 2020-08-28 DIAGNOSIS — L8990 Pressure ulcer of unspecified site, unspecified stage: Secondary | ICD-10-CM | POA: Insufficient documentation

## 2020-08-28 DIAGNOSIS — Z992 Dependence on renal dialysis: Secondary | ICD-10-CM | POA: Diagnosis not present

## 2020-08-28 LAB — RENAL FUNCTION PANEL
Albumin: 2.2 g/dL — ABNORMAL LOW (ref 3.5–5.0)
Albumin: 2.3 g/dL — ABNORMAL LOW (ref 3.5–5.0)
Anion gap: 11 (ref 5–15)
Anion gap: 10 (ref 5–15)
BUN: 55 mg/dL — ABNORMAL HIGH (ref 8–23)
BUN: 20 mg/dL (ref 8–23)
CO2: 27 mmol/L (ref 22–32)
CO2: 29 mmol/L (ref 22–32)
Calcium: 9 mg/dL (ref 8.9–10.3)
Calcium: 8.8 mg/dL — ABNORMAL LOW (ref 8.9–10.3)
Chloride: 99 mmol/L (ref 98–111)
Chloride: 100 mmol/L (ref 98–111)
Creatinine, Ser: 8.15 mg/dL — ABNORMAL HIGH (ref 0.61–1.24)
Creatinine, Ser: 3.33 mg/dL — ABNORMAL HIGH (ref 0.61–1.24)
GFR, Estimated: 7 mL/min — ABNORMAL LOW (ref >60)
GFR, Estimated: 20 mL/min — ABNORMAL LOW (ref >60)
Glucose, Bld: 114 mg/dL — ABNORMAL HIGH (ref 70–99)
Glucose, Bld: 125 mg/dL — ABNORMAL HIGH (ref 70–99)
Phosphorus: 3.7 mg/dL (ref 2.5–4.6)
Phosphorus: 1.6 mg/dL — ABNORMAL LOW (ref 2.5–4.6)
Potassium: 3.6 mmol/L (ref 3.5–5.1)
Potassium: 3.4 mmol/L — ABNORMAL LOW (ref 3.5–5.1)
Sodium: 137 mmol/L (ref 135–145)
Sodium: 139 mmol/L (ref 135–145)

## 2020-08-28 LAB — TYPE AND SCREEN
ABO/RH(D): O POS
Antibody Screen: POSITIVE
Donor AG Type: NEGATIVE
Donor AG Type: NEGATIVE
Unit division: 0
Unit division: 0
Unit division: 0
Unit division: 0
Unit division: 0
Unit division: 0

## 2020-08-28 LAB — BPAM RBC
Blood Product Expiration Date: 202204292359
Blood Product Expiration Date: 202205052359
Blood Product Expiration Date: 202205052359
Blood Product Expiration Date: 202205052359
Blood Product Expiration Date: 202205052359
Blood Product Expiration Date: 202205062359
Unit Type and Rh: 5100
Unit Type and Rh: 5100
Unit Type and Rh: 5100
Unit Type and Rh: 5100
Unit Type and Rh: 5100
Unit Type and Rh: 5100

## 2020-08-28 LAB — CBC
HCT: 26.8 % — ABNORMAL LOW (ref 39.0–52.0)
Hemoglobin: 8.7 g/dL — ABNORMAL LOW (ref 13.0–17.0)
MCH: 30.9 pg (ref 26.0–34.0)
MCHC: 32.5 g/dL (ref 30.0–36.0)
MCV: 95 fL (ref 80.0–100.0)
Platelets: 244 10*3/uL (ref 150–400)
RBC: 2.82 MIL/uL — ABNORMAL LOW (ref 4.22–5.81)
RDW: 17 % — ABNORMAL HIGH (ref 11.5–15.5)
WBC: 11.8 10*3/uL — ABNORMAL HIGH (ref 4.0–10.5)
nRBC: 4 % — ABNORMAL HIGH (ref 0.0–0.2)

## 2020-08-28 LAB — MAGNESIUM: Magnesium: 2.3 mg/dL (ref 1.7–2.4)

## 2020-08-28 LAB — GLUCOSE, CAPILLARY
Glucose-Capillary: 105 mg/dL — ABNORMAL HIGH (ref 70–99)
Glucose-Capillary: 106 mg/dL — ABNORMAL HIGH (ref 70–99)
Glucose-Capillary: 109 mg/dL — ABNORMAL HIGH (ref 70–99)
Glucose-Capillary: 110 mg/dL — ABNORMAL HIGH (ref 70–99)
Glucose-Capillary: 111 mg/dL — ABNORMAL HIGH (ref 70–99)
Glucose-Capillary: 123 mg/dL — ABNORMAL HIGH (ref 70–99)
Glucose-Capillary: 81 mg/dL (ref 70–99)

## 2020-08-28 MED ORDER — LABETALOL HCL 100 MG PO TABS
100.0000 mg | ORAL_TABLET | Freq: Two times a day (BID) | ORAL | Status: DC
  Administered 2020-08-28 – 2020-08-29 (×2): 100 mg via ORAL
  Filled 2020-08-28 (×2): qty 1

## 2020-08-28 MED ORDER — PENTAFLUOROPROP-TETRAFLUOROETH EX AERO: 1.0000 "application " | INHALATION_SPRAY | CUTANEOUS | Status: DC | PRN

## 2020-08-28 MED ORDER — ALTEPLASE 2 MG IJ SOLR: 2.0000 mg | Freq: Once | INTRAMUSCULAR | Status: DC | PRN

## 2020-08-28 MED ORDER — CHLORHEXIDINE GLUCONATE CLOTH 2 % EX PADS: 6.0000 | MEDICATED_PAD | Freq: Every day | CUTANEOUS | Status: DC

## 2020-08-28 MED ORDER — ACETAMINOPHEN 325 MG PO TABS: 650.0000 mg | ORAL_TABLET | ORAL | Status: DC | PRN

## 2020-08-28 MED ORDER — LIDOCAINE-PRILOCAINE 2.5-2.5 % EX CREA
1.0000 "application " | TOPICAL_CREAM | CUTANEOUS | Status: DC | PRN
  Filled 2020-08-28: qty 5

## 2020-08-28 MED ORDER — CALCITRIOL 0.25 MCG PO CAPS
0.2500 ug | ORAL_CAPSULE | Freq: Every day | ORAL | Status: DC
  Administered 2020-08-29 – 2020-08-31 (×3): 0.25 ug via ORAL
  Filled 2020-08-28 (×3): qty 1

## 2020-08-28 MED ORDER — SODIUM CHLORIDE 0.9 % IV SOLN: 100.0000 mL | INTRAVENOUS | Status: DC | PRN

## 2020-08-28 MED ORDER — HYDRALAZINE HCL 25 MG PO TABS
25.0000 mg | ORAL_TABLET | Freq: Three times a day (TID) | ORAL | Status: DC
  Administered 2020-08-28 – 2020-08-29 (×3): 25 mg via ORAL
  Filled 2020-08-28 (×3): qty 1

## 2020-08-28 MED ORDER — TRACE MINERALS CU-MN-SE-ZN 300-55-60-3000 MCG/ML IV SOLN
INTRAVENOUS | Status: DC
  Filled 2020-08-28: qty 567.6

## 2020-08-28 MED ORDER — FERRIC CITRATE 1 GM 210 MG(FE) PO TABS
420.0000 mg | ORAL_TABLET | Freq: Three times a day (TID) | ORAL | Status: DC
  Administered 2020-08-28 – 2020-08-29 (×2): 420 mg via ORAL
  Filled 2020-08-28 (×3): qty 2

## 2020-08-28 MED ORDER — TRAZODONE HCL 50 MG PO TABS
150.0000 mg | ORAL_TABLET | Freq: Every day | ORAL | Status: DC
  Administered 2020-08-28 – 2020-08-30 (×3): 150 mg via ORAL
  Filled 2020-08-28: qty 1
  Filled 2020-08-28 (×2): qty 3

## 2020-08-28 MED ORDER — LIDOCAINE HCL (PF) 1 % IJ SOLN: 5.0000 mL | INTRAMUSCULAR | Status: DC | PRN

## 2020-08-28 MED ORDER — CHLORHEXIDINE GLUCONATE CLOTH 2 % EX PADS
6.0000 | MEDICATED_PAD | Freq: Every day | CUTANEOUS | Status: DC
  Administered 2020-08-28 – 2020-08-31 (×4): 6 via TOPICAL

## 2020-08-28 MED ORDER — OXYCODONE HCL 5 MG PO TABS
5.0000 mg | ORAL_TABLET | Freq: Two times a day (BID) | ORAL | Status: DC | PRN
  Administered 2020-08-28: 5 mg via ORAL
  Filled 2020-08-28: qty 1

## 2020-08-28 NOTE — Progress Notes (Signed)
[facility] Physician-Brief Progress Note Patient Name: Logan Martin DOB: [birth year] MRN: [id number]   Date of Service  08/28/2020  HPI/Events of Note  Hallucinating.  Camera: Thin built. VS stable.  discussed with bed side RN  eICU Interventions  Re orient, supportive care to prevent ICU delirium. Would avoid haldol, qtc 530 on 1 st.  No need for ABG, lasr co2 was 27 on 2 nd noon.      Intervention Category Minor Interventions: Agitation / anxiety - evaluation and management  Elmer Sow 08/28/2020, 3:30 AM

## 2020-08-28 NOTE — Progress Notes (Signed)
Pt was requesting to keep room curtains closed. RN advised pt and family that curtains usually remain open in ICU so that we can monitor the patient closely and for patient safety. Pt insists the curtains be closed, charge RN spoke with family and patient and allowed curtains to be pulled for the time being. Will continue to monitor patient.

## 2020-08-28 NOTE — Progress Notes (Signed)
IV team RN attempted to hang new TNA bag, pt is cursing/refusing TNA to be hung. Patient states it is making him have BMs every 5 mins. Dr. Elsworth Soho notified and states okay to stop TNA for now and maybe attempt to offer to restart at a later time. Pharmacy also notified and on board with this plan. Will continue to monitor.

## 2020-08-28 NOTE — Progress Notes (Signed)
Pt called me to bedside.... Sts he is hearing "backround noise" and that backround noise is saying "racist remarks" using the "n word" and [place] this and that and "Daisy Floro is the best"  I tried to reorient pt back to reality and tried to reassure him that there is no one saying such things. I told him the only thing that I can hear is is [place] going at 7L. I went ahead and briefly turned it off to see if it made a change. Pt verb that he no longer hears the back round noise but once I turned his O2 back on, he did say he hears it again.   Turned his [place] down from 7L to 3L and pt reports he no longer his the back round noise. Told him I would continue to monitor his O2 to make sure he is getting enough O2  Pt verb understanding.... Tried to reassure him again I, as his nurse, would not allow anyone to say such comments about him or anyone else.

## 2020-08-28 NOTE — Progress Notes (Signed)
Pt called me to the bedside again and wanted to tell me that he is now seeing figures on the wall such as faces. He sts he is now convinced that he is hallucinating.   Called [employer] and spoke w/Felicia Therapist, sports... updated them.... will await for further instructions.

## 2020-08-28 NOTE — Progress Notes (Signed)
[facility] ROUNDING NOTE   Subjective:   Brief history: 65 year old gentleman end-stage renal disease history of adenocarcinoma of the bladder presented with GI bleed underwent colonoscopy 08/22/2020.  On 08/23/2020 developed acute abdominal pain and acute splenic rupture with large hemoperitoneum.  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  He is on the home dialysis and maintains a Monday Wednesday Friday schedule in hospital.   Underwent dialysis 08/26/2020 with 3 L removed.  Extubated 08/27/2020.  Appears to be fairly edematous we will proceed with additional dialysis treatment 08/28/2020 and 08/29/2020  Blood pressure 164/61 pulse 102 temperature 97.8 O2 sats 98% 4 L nasal cannula  Insulin sliding scale, Protonix 40 mg IV twice daily     Sodium 137 potassium 3.6 chloride 99 CO2 27 BUN 55 creatinine 8.14 glucose 114 calcium 8.2 albumin 2.1 hemoglobin 7.4  Objective:  Vital signs in last 24 hours:  Temp:  [97.5 F (36.4 C)-98 F (36.7 C)] 97.8 F (36.6 C) (04/03 0700) Pulse Rate:  [81-106] 97 (04/03 1000) BP: (145-217)/(40-137) 166/52 (04/03 1000) SpO2:  [71 %-100 %] 98 % (04/03 1000) FiO2 (%):  [30 %] 30 % (04/03 0000) Weight:  [56.1 kg] 56.1 kg (04/03 0400)  Weight change: -1.3 kg Filed Weights   08/26/20 1315 08/27/20 0400 08/28/20 0400  Weight: 57.4 kg 56.2 kg 56.1 kg    Intake/Output: I/O last 3 completed shifts: In: 2623 [I.V.:2038; NG/GT:185; IV Piggyback:400] Out: 36 [Drains:35; Stool:1]   Intake/Output this shift:  Total I/O In: 327 [I.V.:327] Out: 20 [Drains:20]  General: Non-toxic appearing, nad  Heart: normal rate Lungs: bilateral chest rise, no iwob Abdomen: soft non-distended  Extremities: No LE edema  Dialysis Access: RUE AVF +bruit    Basic Metabolic Panel: Recent Labs  Lab 08/24/20 0323 08/24/20 0342 08/25/20 0431 08/25/20 1351 08/26/20 0432  08/26/20 1902 08/27/20 0423 08/27/20 1434 08/28/20 0404  NA 135   < > 132* 133* 131* 136 135 137 137  K 4.1   < > 4.6 4.3 4.0 3.2* 2.9* 3.7 3.6  CL 100  --  97* 99 98 101 100 99 99  CO2 22  --  22 22 20* 26 24 26 27   GLUCOSE 118*  --  116* 105* 105* 129* 117* 131* 114*  BUN 34*  --  44* 48* 54* 24* 35* 44* 55*  CREATININE 7.10*  --  8.64* 9.38* 9.89* 5.09* 6.58* 7.28* 8.15*  CALCIUM 8.1*  --  8.0* 8.1* 8.2* 8.6* 8.2* 8.8* 9.0  MG 1.6*  --  2.1  --  2.0  --  2.0  --  2.3  PHOS 4.9*  --   --  6.1* 5.0* 3.3 2.9  --  3.7   < > = values in this interval not displayed.    Liver Function Tests: Recent Labs  Lab 08/23/20 1454 08/24/20 0323 08/25/20 0431 08/25/20 1351 08/26/20 0432 08/26/20 1902 08/27/20 0423 08/28/20 0404  AST 28 456* 136*  --  79*  --  58*  --   ALT 13 332* 160*  --  82*  --  48*  --   ALKPHOS 76 47 53  --  56  --  53  --   BILITOT 0.8 1.0 0.6  --  0.7  --  0.6  --   PROT 5.8* 4.8* 4.8*  --  5.0*  --  4.9*  --   ALBUMIN 3.0* 2.6* 2.4*  2.4* 2.3* 2.4* 2.1* 2.2*   Recent Labs  Lab 08/23/20 1331  LIPASE 26   No results for input(s): AMMONIA in the last 168 hours.  CBC: Recent Labs  Lab 08/23/20 1331 08/23/20 1454 08/24/20 0323 08/24/20 0342 08/24/20 2351 08/25/20 0431 08/25/20 1351 08/26/20 0432 08/27/20 0423  WBC 4.5   < > 9.6  --   --  18.1* 16.0* 13.1* 10.4  NEUTROABS 3.1  --   --   --   --   --  12.6* 10.7*  --   HGB 4.6*   < > 9.6*   < > 8.5* 8.5* 8.4* 8.1* 7.4*  HCT 14.1*   < > 26.7*   < > 24.9* 24.9* 24.6* 24.0* 22.2*  MCV 95.3   < > 84.2  --   --  89.6 90.1 92.3 92.1  PLT 103*   < > 102*  --   --  128* 139* 155 163   < > = values in this interval not displayed.    Cardiac Enzymes: No results for input(s): CKTOTAL, CKMB, CKMBINDEX, TROPONINI in the last 168 hours.  BNP: Invalid input(s): POCBNP  CBG: Recent Labs  Lab 08/27/20 2015 08/27/20 2347 08/28/20 0321 08/28/20 0539 08/28/20 0749  GLUCAP 110* 119* 105* 110* 111*     Microbiology: Results for orders placed or performed during the hospital encounter of 08/19/20  SARS CORONAVIRUS 2 (TAT 6-24 HRS) Nasopharyngeal Nasopharyngeal Swab     Status: None   Collection Time: 08/19/20  7:16 PM   Specimen: Nasopharyngeal Swab  Result Value Ref Range Status   SARS Coronavirus 2 NEGATIVE NEGATIVE Final    Comment: (NOTE) SARS-CoV-2 target nucleic acids are NOT DETECTED.  The SARS-CoV-2 RNA is generally detectable in upper and lower respiratory specimens during the acute phase of infection. Negative results do not preclude SARS-CoV-2 infection, do not rule out co-infections with other pathogens, and should not be used as the sole basis for treatment or other patient management decisions. Negative results must be combined with clinical observations, patient history, and epidemiological information. The expected result is Negative.  Fact Sheet for Patients: SugarRoll.be  Fact Sheet for Healthcare Providers: https://www.woods-mathews.com/  This test is not yet approved or cleared by the [place] FDA and  has been authorized for detection and/or diagnosis of SARS-CoV-2 by FDA under an Emergency Use Authorization (EUA). This EUA will remain  in effect (meaning this test can be used) for the duration of the COVID-19 declaration under Se ction 564(b)(1) of the Act, 21 U.S.C. section 360bbb-3(b)(1), unless the authorization is terminated or revoked sooner.  Performed at [facility], [place] [address] [place], [place] [address]   Blood culture (routine x 2)     Status: None   Collection Time: 08/20/20 12:21 AM   Specimen: BLOOD  Result Value Ref Range Status   Specimen Description BLOOD SITE NOT SPECIFIED  Final   Special Requests   Final    BOTTLES DRAWN AEROBIC AND ANAEROBIC Blood Culture adequate volume   Culture   Final    NO GROWTH 5 DAYS Performed at [facility], [address],  [place], [place] [address]    Report Status 08/25/2020 FINAL  Final  Blood culture (routine x 2)     Status: None   Collection Time: 08/20/20  4:04 AM   Specimen: BLOOD  Result Value Ref Range Status   Specimen Description BLOOD SITE NOT SPECIFIED  Final   Special Requests AEROBIC BOTTLE  ONLY Blood Culture adequate volume  Final   Culture   Final    NO GROWTH 5 DAYS Performed at [facility], [place] [address] [place], [place] [address]    Report Status 08/25/2020 FINAL  Final  MRSA PCR Screening     Status: None   Collection Time: 08/26/20  9:29 AM   Specimen: Nasal Mucosa; Nasopharyngeal  Result Value Ref Range Status   MRSA by PCR NEGATIVE NEGATIVE Final    Comment:        The GeneXpert MRSA Assay (FDA approved for NASAL specimens only), is one component of a comprehensive MRSA colonization surveillance program. It is not intended to diagnose MRSA infection nor to guide or monitor treatment for MRSA infections. Performed at [facility], [place] [address] [place], [place] [address]     Coagulation Studies: No results for input(s): LABPROT, INR in the last 72 hours.  Urinalysis: No results for input(s): COLORURINE, LABSPEC, PHURINE, GLUCOSEU, HGBUR, BILIRUBINUR, KETONESUR, PROTEINUR, UROBILINOGEN, NITRITE, LEUKOCYTESUR in the last 72 hours.  Invalid input(s): APPERANCEUR    Imaging: DG Abd Portable 1V  Result Date: 08/27/2020 CLINICAL DATA:  Nausea. EXAM: PORTABLE ABDOMEN - 1 VIEW COMPARISON:  08/24/2020 FINDINGS: Bowel gas pattern is nonobstructive with contrast throughout the colon. Surgical drain is present over the left upper quadrant. No free peritoneal air. Remainder of the exam is unchanged. IMPRESSION: Nonobstructive bowel gas pattern. Electronically Signed   By: Marin Olp M.D.   On: 08/27/2020 14:41     Medications:   . sodium chloride    . sodium chloride    . sodium chloride Stopped (08/25/20 2128)  . TPN ADULT (ION) 55 mL/hr at 08/28/20 1000   . TPN ADULT (ION)     . sodium chloride   Intravenous Once  . sodium chloride   Intravenous Once  . amLODipine  10 mg Oral Daily  . chlorhexidine gluconate (MEDLINE KIT)  15 mL Mouth Rinse BID  . Chlorhexidine Gluconate Cloth  6 each Topical Q0600  . Chlorhexidine Gluconate Cloth  6 each Topical Q0600  . Chlorhexidine Gluconate Cloth  6 each Topical Q0600  . Chlorhexidine Gluconate Cloth  6 each Topical Q0600  . hydrALAZINE  25 mg Oral TID  . insulin aspart  0-9 Units Subcutaneous Q6H  . labetalol  100 mg Oral BID  . mouth rinse  15 mL Mouth Rinse 10 times per day  . pantoprazole  40 mg Intravenous Q12H  . sodium chloride flush  10-40 mL Intracatheter Q12H  . traZODone  150 mg Oral QHS   sodium chloride, sodium chloride, sodium chloride, alteplase, fentaNYL (SUBLIMAZE) injection, heparin, labetalol, lidocaine (PF), lidocaine, lidocaine-prilocaine, ondansetron **OR** ondansetron (ZOFRAN) IV, oxyCODONE, pentafluoroprop-tetrafluoroeth, sodium chloride, sodium chloride flush  Assessment/ Plan:  Dialysis Orders: Nx Stage MTTF EDW 46.5kg  Assessment/Plan: 1. Splenic rupture: Went to the OR on 3/29.  Surgery assisting.  Anemia seems to be stabilizing 2. Melena/GIB/hemorrhagic shock-status post transfusions for possible diverticular bleed but now with massive bleeding related to splenic rupture.  Transfusions per primary. 3. ESRD -on home dialysis maintaining MWF.  Underwent dialysis 08/26/2020.  Appears edematous we will plan dialysis for 07/2020 as well as 08/29/2020 4. Anemia --transfusions and hemorrhage as above.  Received Aranesp 60 with HD 3/26.  5. Hypertension/volume - restarted antihypertensive medications 6. Metabolic bone disease -no current medications will need to restart binders-  Calcitrol 0.25 mcg daily as well as Sensipar 30 mg daily and Auryxia 210 mg 3 times  daily with meals.  We will hold Sensipar at this point 7. Oral abscess -oral hygiene      LOS: [place] @TODAY @10 :56 AM

## 2020-08-28 NOTE — Evaluation (Signed)
Physical Therapy Evaluation Patient Details Name: Logan Martin MRN: [id number] DOB: January 27, 1956 Today's Date: 08/28/2020   History of Present Illness  65 y.o. male, with PMH of ESRD, HTN, HLD, CAD, history of adenocarcinoma of the bladder, anemia, thrombocytopenia, hx of PE, hepatitis C who presented to the ER on 08/19/2020 with general malaise, tachycardia. +melena, colonoscopy 3/28 required intubation/extubation and then pt became hypotensive and reintubated; significant hemoperitoneum with splenic rupture and underwent exploratory laparotomy with splenectomy; 3/31 re-exp lap with closure; 4/2 extubated  Clinical Impression   Pt admitted secondary to problem above with deficits below. PTA patient was walking with rollator and doing his dialysis at home with assist of his grandson. Pt currently requires moderated assistance to take 3 steps laterally. He has 2+3 steps to enter his home. His family is going to put his ramp back up (he had them take it down), but may need medical transport home if ramp not up or pt not able to ascend steps with min assist.  Will continue to follow acutely to maximize functional mobility independence and safety.       Follow Up Recommendations Home health PT;Supervision for mobility/OOB (may need ambulance transport if cannot safely go up steps)    Equipment Recommendations       Recommendations for Other Services OT consult     Precautions / Restrictions Precautions Precautions: Fall      Mobility  Bed Mobility Overal bed mobility: Needs Assistance Bed Mobility: Rolling;Sidelying to Sit;Sit to Sidelying Rolling: Min assist Sidelying to sit: Min assist     Sit to sidelying: Mod assist General bed mobility comments: vc for technique to minimize abd pain; assist due to pain    Transfers Overall transfer level: Needs assistance Equipment used: None Transfers: Sit to/from Stand Sit to Stand: Mod assist         General transfer comment: face to  face technique; pt required assist to fully extend knees (still remained slightly flexed); returned to bed at RNs request due to hoping pt will sleep and decr hallucinations  Ambulation/Gait Ambulation/Gait assistance: Mod assist Gait Distance (Feet): 2 Feet Assistive device: None Gait Pattern/deviations: Step-to pattern     General Gait Details: sidestep to [facility] prior to return to sitting  Stairs            Wheelchair Mobility    Modified Rankin (Stroke Patients Only)       Balance Overall balance assessment: Needs assistance Sitting-balance support: No upper extremity supported;Feet supported Sitting balance-Leahy Scale: Fair     Standing balance support: Bilateral upper extremity supported Standing balance-Leahy Scale: Poor Standing balance comment: slight posterior bias;                             Pertinent Vitals/Pain Pain Assessment: Faces Faces Pain Scale: Hurts a little bit Pain Location: abdomen Pain Descriptors / Indicators: Discomfort Pain Intervention(s): Limited activity within patient's tolerance;Monitored during session    Home Living Family/patient expects to be discharged to:: Private residence Living Arrangements: Other relatives (grandson) Available Help at Discharge: Family Type of Home: House Home Access: Stairs to enter;Other (comment) (has a ramp that was taken down; family plans to put it up again) Entrance Stairs-Rails: None Entrance Stairs-Number of Steps: 2+3 Home Layout: One level Home Equipment: Walker - 4 wheels;Shower seat;Other (comment) (electric scooter to be delivered 4/4 to his home) Additional Comments: daughter present and confirmed information is accurate    Prior Function Level  of Independence: Needs assistance   Gait / Transfers Assistance Needed: walks with rollator; assist on steps     Comments: home dialysis     Hand Dominance        Extremity/Trunk Assessment   Upper Extremity  Assessment Upper Extremity Assessment: Generalized weakness    Lower Extremity Assessment Lower Extremity Assessment: Generalized weakness    Cervical / Trunk Assessment Cervical / Trunk Assessment: Other exceptions Cervical / Trunk Exceptions: s/p exp lap x 2 with abd pain  Communication   Communication: HOH (wears bil hearing aids)  Cognition Arousal/Alertness: Awake/alert Behavior During Therapy: Flat affect Overall Cognitive Status: Within Functional Limits for tasks assessed                                 General Comments: has had periods of hallucinations and appropriate during PT      General Comments General comments (skin integrity, edema, etc.): Pt required max encouragement to participate despite MD having told him he needed to work with PT today. Pt focused on getting a swallow eval by SLP that MD just ordered.    Exercises     Assessment/Plan    PT Assessment Patient needs continued PT services  PT Problem List Decreased strength;Decreased activity tolerance;Decreased balance;Decreased mobility;Decreased knowledge of use of DME       PT Treatment Interventions DME instruction;Gait training;Stair training;Functional mobility training;Therapeutic activities;Therapeutic exercise;Balance training;Patient/family education    PT Goals (Current goals can be found in the Care Plan section)  Acute Rehab PT Goals Patient Stated Goal: return home PT Goal Formulation: With patient Time For Goal Achievement: 08/28/20 Potential to Achieve Goals: Fair    Frequency Min 3X/week   Barriers to discharge        Co-evaluation               AM-PAC PT "6 Clicks" Mobility  Outcome Measure Help needed turning from your back to your side while in a flat bed without using bedrails?: A Little Help needed moving from lying on your back to sitting on the side of a flat bed without using bedrails?: A Little Help needed moving to and from a bed to a chair  (including a wheelchair)?: A Lot Help needed standing up from a chair using your arms (e.g., wheelchair or bedside chair)?: A Lot Help needed to walk in hospital room?: Total Help needed climbing 3-5 steps with a railing? : Total 6 Click Score: 12    End of Session   Activity Tolerance: Patient limited by fatigue Patient left: in bed;with call bell/phone within reach;with bed alarm set;with family/visitor present;with SCD's reapplied Nurse Communication: Mobility status PT Visit Diagnosis: Unsteadiness on feet (R26.81);Muscle weakness (generalized) (M62.81)    Time: 0354-6568 PT Time Calculation (min) (ACUTE ONLY): 33 min   Charges:   PT Evaluation $PT Eval Moderate Complexity: 1 Mod PT Treatments $Therapeutic Activity: 8-22 mins         Arby Barrette, PT Pager [phone]   Rexanne Mano 08/28/2020, 1:55 PM

## 2020-08-28 NOTE — Progress Notes (Signed)
Patient ID: Logan Martin, male   DOB: [birth year], 65 y.o.   MRN: [id number]   Acute Care Surgery Service Progress Note:    Chief Complaint/Subjective: Spitting up some secondary to a very edematous uvula.  No nausea.  Passing lots of flatus and having BMs  Objective: Vital signs in last 24 hours: Temp:  [97.5 F (36.4 C)-98 F (36.7 C)] 97.8 F (36.6 C) (04/03 0700) Pulse Rate:  [81-107] 98 (04/03 0900) BP: (145-217)/(40-137) 164/70 (04/03 0900) SpO2:  [71 %-100 %] 98 % (04/03 0900) Arterial Line BP: (197-235)/(68-79) 197/68 (04/02 1015) FiO2 (%):  [30 %] 30 % (04/03 0000) Weight:  [56.1 kg] 56.1 kg (04/03 0400) Last BM Date: 08/28/20  Intake/Output from previous day: 04/02 0701 - 04/03 0700 In: 1480 [I.V.:1170; NG/GT:10; IV Piggyback:300] Out: 36 [Drains:35; Stool:1] Intake/Output this shift: Total I/O In: 272 [I.V.:272] Out: 20 [Drains:20]  Lungs:  Nonlabored  Cardiovascular: reg  Abd: soft, mild distension, approp TTP, drain-serosang, midline wound clean and open and packed  Neuro: alert, nonfocal  Lab Results: CBC  Recent Labs    08/26/20 0432 08/27/20 0423  WBC 13.1* 10.4  HGB 8.1* 7.4*  HCT 24.0* 22.2*  PLT 155 163   BMET Recent Labs    08/27/20 1434 08/28/20 0404  NA 137 137  K 3.7 3.6  CL 99 99  CO2 26 27  GLUCOSE 131* 114*  BUN 44* 55*  CREATININE 7.28* 8.15*  CALCIUM 8.8* 9.0   LFT Hepatic Function Latest Ref Rng & Units 08/28/2020 08/27/2020 08/26/2020  Total Protein 6.5 - 8.1 g/dL - 4.9(L) -  Albumin 3.5 - 5.0 g/dL 2.2(L) 2.1(L) 2.4(L)  AST 15 - 41 U/L - 58(H) -  ALT 0 - 44 U/L - 48(H) -  Alk Phosphatase 38 - 126 U/L - 53 -  Total Bilirubin 0.3 - 1.2 mg/dL - 0.6 -  Bilirubin, Direct 0.0 - 0.2 mg/dL - - -   PT/INR No results for input(s): LABPROT, INR in the last 72 hours. ABG No results for input(s): PHART, HCO3 in the last 72 hours.  Invalid input(s): PCO2, PO2  Studies/Results:  Anti-infectives: Anti-infectives (From  admission, onward)   Start     Dose/Rate Route Frequency Ordered Stop   08/24/20 1200  vancomycin (VANCOREADY) IVPB 500 mg/100 mL  Status:  Discontinued        500 mg 100 mL/hr over 60 Minutes Intravenous Every M-W-F (Hemodialysis) 08/23/20 1724 08/23/20 2039   08/24/20 1200  vancomycin (VANCOREADY) IVPB 500 mg/100 mL  Status:  Discontinued        500 mg 100 mL/hr over 60 Minutes Intravenous Every M-W-F (Hemodialysis) 08/23/20 2052 08/26/20 0826   08/23/20 2145  vancomycin (VANCOREADY) IVPB 1000 mg/200 mL  Status:  Discontinued        1,000 mg 200 mL/hr over 60 Minutes Intravenous  Once 08/23/20 2053 08/23/20 2055   08/23/20 2100  piperacillin-tazobactam (ZOSYN) IVPB 2.25 g  Status:  Discontinued        2.25 g 100 mL/hr over 30 Minutes Intravenous Every 8 hours 08/23/20 2052 08/27/20 1254   08/23/20 1730  vancomycin (VANCOREADY) IVPB 1000 mg/200 mL  Status:  Discontinued        1,000 mg 200 mL/hr over 60 Minutes Intravenous  Once 08/23/20 1723 08/23/20 2101   08/23/20 1730  piperacillin-tazobactam (ZOSYN) IVPB 2.25 g  Status:  Discontinued        2.25 g 100 mL/hr over 30 Minutes Intravenous Every 8 hours 08/23/20 1726  08/23/20 2039   08/23/20 1430  piperacillin-tazobactam (ZOSYN) IVPB 3.375 g  Status:  Discontinued        3.375 g 12.5 mL/hr over 240 Minutes Intravenous Every 12 hours 08/23/20 1427 08/23/20 1726   08/20/20 0415  amoxicillin-clavulanate (AUGMENTIN) 500-125 MG per tablet 500 mg  Status:  Discontinued        1 tablet Oral Daily 08/20/20 0329 08/21/20 1258   08/19/20 2345  Ampicillin-Sulbactam (UNASYN) 3 g in sodium chloride 0.9 % 100 mL IVPB  Status:  Discontinued        3 g 200 mL/hr over 30 Minutes Intravenous  Once 08/19/20 2333 08/20/20 0329      Medications: Scheduled Meds: . sodium chloride   Intravenous Once  . sodium chloride   Intravenous Once  . amLODipine  10 mg Oral Daily  . chlorhexidine gluconate (MEDLINE KIT)  15 mL Mouth Rinse BID  . Chlorhexidine  Gluconate Cloth  6 each Topical Q0600  . Chlorhexidine Gluconate Cloth  6 each Topical Q0600  . hydrALAZINE  25 mg Oral TID  . insulin aspart  0-9 Units Subcutaneous Q6H  . labetalol  100 mg Oral BID  . mouth rinse  15 mL Mouth Rinse 10 times per day  . pantoprazole  40 mg Intravenous Q12H  . sodium chloride flush  10-40 mL Intracatheter Q12H  . traZODone  150 mg Oral QHS   Continuous Infusions: . sodium chloride    . sodium chloride    . sodium chloride Stopped (08/25/20 2128)  . TPN ADULT (ION) 55 mL/hr at 08/28/20 0900   PRN Meds:.sodium chloride, sodium chloride, sodium chloride, alteplase, fentaNYL (SUBLIMAZE) injection, heparin, labetalol, lidocaine (PF), lidocaine, lidocaine-prilocaine, ondansetron **OR** ondansetron (ZOFRAN) IV, oxyCODONE, pentafluoroprop-tetrafluoroeth, sodium chloride, sodium chloride flush  Assessment/Plan: Patient Active Problem List   Diagnosis Date Noted  . Pressure injury of skin 08/28/2020  . Abdominal pain   . Acute respiratory failure ([place])   . Tachycardia 08/20/2020  . Oral infection 08/20/2020  . Non-ST elevation (NSTEMI) myocardial infarction ([place])   . ESRD (end stage renal disease) on dialysis ([place]) 05/05/2020  . Prolonged QT interval 05/05/2020  . Allergy, unspecified, sequela 03/18/2020  . Pain, unspecified 03/18/2020  . Personal history of anaphylaxis 03/18/2020  . Cellulitis 03/10/2020  . Chronic low back pain   . Peripheral vascular disease ([place])   . Other mechanical complication of surgically created arteriovenous fistula, initial encounter ([place]) 03/09/2020  . Unspecified open wound of right upper arm, subsequent encounter 03/09/2020  . Hypercalcemia 01/20/2020  . Other disorders of phosphorus metabolism 01/01/2020  . Severe mitral regurgitation 12/22/2018  . Hypercholesterolemia 12/22/2018  . History of bladder cancer 08/14/2018  . Personal history of pulmonary embolism 08/14/2018  . Red blood cell antibody positive, compatible  PRBC difficult to obtain 08/14/2018  . Malignant neoplasm of overlapping sites of bladder ([place]) 07/20/2018  . Volume overload 06/18/2018  . Fluid overload 06/17/2018  . Elevated troponin 06/17/2018  . Hematuria 03/27/2018  . Hypokalemia 03/27/2018  . Thrombocytopenia ([place]) 03/27/2018  . Anemia in ESRD (end-stage renal disease) ([place]) 03/27/2018  . Bladder tumor 03/27/2018  . Encounter for adequacy testing for peritoneal dialysis ([place]) 01/03/2018  . 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 Compression of vein 11/12/2017  . Hypertension with heart disease 10/14/2017  . Pyelocystitis 10/13/2017  . 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 Cramp and spasm 02/20/2017  . Acidosis 02/18/2017  . Dependence on renal dialysis ([place]) 02/18/2017  . Hypertensive heart and chronic  kidney disease with heart failure and with stage 5 chronic kidney disease, or end stage renal disease ([place]) 02/18/2017  . Iron deficiency anemia, unspecified 02/18/2017  . Moderate protein-calorie malnutrition ([place]) 02/18/2017  . Other dietary vitamin B12 deficiency anemia 02/18/2017  . Other disorders of electrolyte and fluid balance, not elsewhere classified 02/18/2017  . Other disorders resulting from impaired renal tubular function 02/18/2017  . Personal history of nicotine dependence 02/18/2017  . Cardiomegaly 01/21/2017  . Congestive heart failure ([place]) 01/21/2017  . Pancytopenia ([place]) 06/14/2016  . Left arm pain 06/14/2016  . 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 Mechanical complication of other vascular device, implant, and graft 08/10/2013  . End stage renal disease ([place]) 08/10/2013  . ESRD (end stage renal disease) ([place]) 06/07/2013  . Nausea and vomiting 06/07/2013  . SOB (shortness of breath) 05/10/2013  . Chest discomfort 11/27/2012  . Shortness of breath 11/27/2012  . Chronic hepatitis C without hepatic coma ([place]) 02/04/2012  . Secondary hyperparathyroidism (of renal origin) 02/04/2012  . Anemia  associated with chronic renal failure 02/04/2012  . CAD (coronary atherosclerotic disease) 02/04/2012  . Pulmonary embolism ([place]) 07/20/2010  . Chronic pain syndrome 06/08/2010  . Renal failure 04/26/2010  . Atherosclerosis of native coronary artery of native heart without angina pectoris 02/23/2009  . HEMORRHOIDS, INTERNAL 02/23/2009  . GERD 02/23/2009  . ESRD on dialysis ([place]) 02/16/2009  . Allergic rhinitis 05/06/2008   ESRD on HD HTN HLD CAD PVD 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 Lovena Neighbours), 3/16/2020Cystoprostatectomy + resection bladder augmentation + enterolysis([facility], Dr. Zenia Resides) [place] with mesh (02/2019 Dr. Kieth Brightly)  thrombocytopenia PMH PE Hepatitis C   Acute on chronic anemia - transfuse as needed per primary. hgb down slightly  GI Bleed requiring transfusion - CTA abdomen pelvis 3/28 with no evidence of GIB - s/p colonoscopy 3/28 Dr. Lyndel Safe; two colon polyps removed, diverticulosis noted, no bleeding; required intubated for procedure due to flash pulm edema - initial intubation attempt was in the esophagus. Esophogram negative for esophageal injury/leak. - Dr. Lyndel Safe performed bedside endoscopy 3/30 showing Grade I esophageal varices, Small HH with healing Mallory-Weiss tear. No active bleeding. Gastritis present.   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 Greer Pickerel S/p re-exploration, ex lap, washout, abdominal closure 3/31 Dr. Kalman Drape - POD#3, afebrile - drain serosang -ST eval and if passes, ok for clear liquids today.  D/w primary service. -no antibiotics needed from abdominal standpoint -PT/OT consult for mobilization -cbc pending today  FEN: NPO except ice, pre-albumin 16.7 ID: Zosyn 3/29 >> 4/3 VTE: SCD's, chemical VTE held in the setting of resolving hemorrhagic shock  Foley: none, anuric at baseline Dispo: CLD if passes swallow with  speech therapy   LOS: 8 days    Leighton Ruff. Redmond Pulling, MD, FACS General, Bariatric, & Minimally Invasive Surgery [phone] [facility]

## 2020-08-28 NOTE — Progress Notes (Signed)
Dr. Justin Mend made aware  Of pt. SBP from 160's to 114. Orders to decrease goal to 1-2L with total tx time 3 hours. Pt. Stable. Pt. informed

## 2020-08-28 NOTE — Progress Notes (Signed)
Pt refused bath this morning .... sts he will rather wait until next shift.

## 2020-08-28 NOTE — Progress Notes (Signed)
PHARMACY - TOTAL PARENTERAL NUTRITION CONSULT NOTE  Indication:  Intolerance to EN  Patient Measurements: Height: 5' 4"  (162.6 cm) Weight: 56.1 kg (123 lb 10.9 oz) IBW/kg (Calculated) : 59.2 TPN AdjBW (KG): 49.1 Body mass index is 21.23 kg/m.  Weight 45.5 kg on admit, now 51.5 kg d/t volume  Assessment:  64 YOM presented on 08/19/20 with chest pain and SOB.  Had hematochezia and CTA was negative for overt bleeding.  Colonoscopy on 3/28 showed diverticulosis and polyps that were resected.  Patient deteriorated on 3/29, requiring intubation and emergent ex-lap with splenectomy and wound VAC application for splenic rupture.  Abdomen left open. EGD on 3/30 revealed esophageal varices with small hiatal hernia and healing Mallory-Weiss tear.  Patient is currently in the OR for possible abdominal closure.  Pharmacy consulted for TPN management.  Patient has moderate PCM.  Glucose / Insulin: no hx DM - CBGs < 180.  No SSI usage yet. Electrolytes: Na 137, K 3.6, Phos 3.7, Mg 2.3, CoCa 10.3, Cl/HCO3 wnl.  Renal: ESRD on MWF HD - last 4/1 (3.5h BFR 400), BUN up 55 Hepatic: AST/ALT down to 58/48, alk phos / tbili / TG WNL.  Albumin 2.2, BL prealbumin 16.7 Intake / Output; MIVF: Planning SLP eval and start of CLD 4/3 if passes, +flatus, LBM 4/3,  net +14L GI Imaging: none since TPN start 4/2: Abd X-ray: nonobstructive bowel gas pattern GI Surgeries / Procedures:  3/31 OR - abd washout and closure  Central access: PICC placed 08/20/20, CVC single lumen placed 08/23/20 TPN start date: 08/25/20  Nutritional Goals (per RD rec on 4/1): 1500 kCal and 80-95gm AA per day  Current Nutrition:  TPN NPO - planning SLP eval 4/3  Plan:  Continue concentrated TPN to goal rate 55 ml/hr at 1800 TPN will provide 85g AA and 1507 kCal, meeting 100% of patient needs Electrolytes in TPN (no adjustments 4/3): cont Na 110 mEq/L, cont K 20 mEq/L, cont Mag 78mq/L, cont Ca 755m/L, no Phos, cont Cl:Ac 1:2 Add standard  MVI and trace elements to TPN.  Remove chromium d/t ESRD. Continue sensitive SSI Q6H Renal function panel BID per MD Will f/u SLP eval and diet progression to start possible TPN wean 4/4  Thank you for allowing pharmacy to be a part of this patient's care.  ElAlycia RossettiPharmD, BCPS Clinical Pharmacist Clinical phone for 08/28/2020: [phone]/07/2020 7:37 AM   **Pharmacist phone directory can now be found on [email or web address] ([email or web address]).  Listed under [place]

## 2020-08-28 NOTE — Progress Notes (Signed)
Patient refused bath this morning when offered a second time on day shift.

## 2020-08-28 NOTE — Progress Notes (Addendum)
NAME:  Logan Martin, MRN:  [id number], DOB:  08/21/1955, LOS: 8 ADMISSION DATE:  08/19/2020, CONSULTATION DATE:  08/23/2020 REFERRING MD:  Dr. Waldron Labs, CHIEF COMPLAINT:  hypotension  History of Present Illness:  65 year old male with prior history of ESRD, HTN, HLD, CAD, history of adenocarcinoma of the bladder, anemia, thrombocytopenia, hx of PE, hepatitis C presenting on 3/25 with progressive fatigue and generalized weakness with dark stools for 1 week.   Work-up significant for anemia and melanotic stools.  CTA abdomen pelvis showed no active source of GI bleeding.  It did note focal areas of high-grade stenosis within distal branches of the SMA in the right lower quadrant. Due to poor IV access, a central line was placed by IR.  Nephrology and [facility] consulted.  Underwent colonoscopy on 3/28 which showed diverticulosis, 2 polyps were removed and sent for biopsy but showed no evidence of GI bleed.  Patient has required ongoing transfusion throughout hospitalization for ongoing anemia of unclear source.  On 3/29 patient acutely complained of worsening abdominal and chronic back pain, found to have splenic rupture, required massive transfusion and emergent OR  Pertinent  Medical History  ESRD (on home HD MTTF), HTN, HLD, CAD, history of adenocarcinoma of the bladder, anemia, thrombocytopenia, hx of PE, hepatitis C  Hard of hearing   Significant Hospital Events: Including procedures, antibiotic start and stop dates in addition to other pertinent events   . 3/25 admitted to [facility], nephrology following  . 3/26 L IJ DL CVL placed by IR (of note, unable to pass via right IJ due to occlusion and lower right IJ at venous stent) . 3/27 [facility] consulted . 3/28 colonoscopy by Dr. Lyndel Safe with two polyps removed, required intubation during procedure for flash pulmonary edema; extubated post procedure . 3/29 transfused 4th unit PRBC this am.  During afternoon, acute decompensation/ hypotension on  floor -> RRT, vasopresors started, stat CT abd/ pelvis and surgical consult -> intubated, then to OR with ruptured spleen s/p MTP and splenectomy . 3/30 EGD 3/30 - healing Mallory-Weiss tear, grade 1 esophageal varices, small hiatal hernia  . 3/31 OR >> washout & abd closure . 4/1off vasopressin and Levophed , hemodialysis, started tube feeds  Interim History / Subjective:   Extubated yesterday. Has done well from a respiratory standpoint but Hypertensive requiring as needed labetalol. Overnight, was hallucinating, fentanyl was held He is very upset this morning because he was not given anything to sleep or for pain. He is upset that his blood pressure medicines were held. Daughter at bedside  Objective   Blood pressure (!) 149/46, pulse 87, temperature 97.8 F (36.6 C), temperature source Oral, resp. rate 19, height 5\' 4"  (1.626 m), weight 56.1 kg, SpO2 98 %.    FiO2 (%):  [30 %] 30 %   Intake/Output Summary (Last 24 hours) at 08/28/2020 0901 Last data filed at 08/28/2020 0400 Gross per 24 hour  Intake 1288.55 ml  Output 21 ml  Net 1267.55 ml   Filed Weights   08/26/20 1315 08/27/20 0400 08/28/20 0400  Weight: 57.4 kg 56.2 kg 56.1 kg   Examination: General: Well-built man, no acute distress, visibly upset HEENT: MM pink/moist, swollen 'bobbing' uvula Neuro: RASS 0, anxious affect, nonfocal, interactive, clear speech CV: s1s2 rrr, no m/r/g PULM: No accessory muscle use clear breath sounds bilateral GI: soft, abdomen closed, JP drain with serosanguineous fluid Extremities: warm/dry, no sig edema  Skin: no rashes or lesions , anasarca   Labs show normal potassium,  rising creatinine Chest x-ray 4/1 independently reviewed shows left lower lobe?  Opacity  Labs/imaging that I havepersonally reviewed  (right click and "Reselect all SmartList Selections" daily)  3/25 SARS/ flu >> neg 3/26 BCx >>ng  3/25 amoxicillin >> 3/26 3/29 zosyn >> 4/2 3/29 vanc >>4/1  3/26 L IJ  CVL>> 3/29 ETT >>4/2 3/29 R femoral Aline >> 4/2 3/29 L femoral cordis >> 4/1  3/26 CTA abd/ pelvis >> VASCULAR 1. No evidence of active gastrointestinal hemorrhage. 2. Extensive atherosclerosis of the aorta and its branches. Focal areas of high-grade stenosis within distal branches of the SMA in the right lower quadrant. High-grade bilateral renal artery stenosis, left greater than right NON-VASCULAR 1. Numerous bilateral hepatic cysts unchanged. 2. Bilateral renal cortical atrophy with diffuse cystic change, consistent with known end-stage renal disease. 3. No acute intra-abdominal or intrapelvic process. 4. Aortic Atherosclerosis and Emphysema   3/28 colonoscopy >> One 6 mm polyp in the proximal transverse colon, removed with a cold snare. Resected and retrieved.  One 6 mm polyp in the mid descending colon, removed with a cold snare. Resected and retrieved.  Diverticulosis in the sigmoid colon and in the ascending colon.  Non-bleeding internal hemorrhoids.  3/29 CTA a/p >> acute splenic rupture with large hemoperitoneum and moderate subscapsular splenic hematoma  Resolved Hospital Problem list    Assessment & Plan:   Shock- hemorraghic secondary to splenic rupture and hemoperitoneum s/p ex lap and splenectomy 3/29 s/p MTP, resolved PLAN  - Surgery following, monitor JP drain output, tolerating tube feeds, can dc TNA as he advances orally -Stable H/H, checked again today, transfuse for Hgb < 7 -DC Zosyn since there was no viscus rupture   ABLA/ symptomatic anemia - acute decompensation 3/29 2/2 to splenic rupture, prior to that no evidence of active bleed causing symptomatic anemia by CTA a/p or colonoscopy - s/p MTP 3/29 (9 PRBC, 6 FFP, 1 platelet, 1 cryo ) w/ premedication given known antibodies; had 4 PRBC prior to MTP since admission PLAN  -- transfuse for Hgb < 7 -No evidence of delayed hemolysis  Acute hypoxic respiratory failure  -Extubated, decrease nasal cannula as  tolerated  ESRD on HD - Nephrology following  -S/p HD 4/1, would suggest another round of dialysis today for volume  Hypertension -now that shock is resolved and blood pressure is high, would resume oral labetalol, hydralazine  ICU delirium -QTC prolonged so holding Haldol. We will treat pain, resume oxycodone although at some risk for ileus PT Reorientation Resume trazodone for sleep  Dysphagia - related to swollen uvula ?traumatic -swallow eval  Daughter updated at bedside  Best practice (right click and "Reselect all SmartList Selections" daily)  Diet: Advance p.o., TNA Pain/Anxiety/Delirium protocol (if indicated): Oxycodone VAP protocol (if indicated): Yes DVT prophylaxis: Contraindicated, SCDs GI prophylaxis: PPI BID Glucose control:  SSI Yes Central venous access:  Yes, and it is still needed Arterial line: Discontinued 4/2 Foley:  N/A anuric at baseline Mobility:  As toelrated PT consulted- Y Last date of multidisciplinary goals of care discussion 4/1 Code Status:  full code Disposition: ICU >>     Labs   CBC: Recent Labs  Lab 08/23/20 1331 08/23/20 1454 08/24/20 0323 08/24/20 0342 08/24/20 2351 08/25/20 0431 08/25/20 1351 08/26/20 0432 08/27/20 0423  WBC 4.5   < > 9.6  --   --  18.1* 16.0* 13.1* 10.4  NEUTROABS 3.1  --   --   --   --   --  12.6* 10.7*  --  HGB 4.6*   < > 9.6*   < > 8.5* 8.5* 8.4* 8.1* 7.4*  HCT 14.1*   < > 26.7*   < > 24.9* 24.9* 24.6* 24.0* 22.2*  MCV 95.3   < > 84.2  --   --  89.6 90.1 92.3 92.1  PLT 103*   < > 102*  --   --  128* 139* 155 163   < > = values in this interval not displayed.    Basic Metabolic Panel: Recent Labs  Lab 08/24/20 0323 08/24/20 0342 08/25/20 0431 08/25/20 1351 08/26/20 0432 08/26/20 1902 08/27/20 0423 08/27/20 1434 08/28/20 0404  NA 135   < > 132* 133* 131* 136 135 137 137  K 4.1   < > 4.6 4.3 4.0 3.2* 2.9* 3.7 3.6  CL 100  --  97* 99 98 101 100 99 99  CO2 22  --  22 22 20* 26 24 26 27    GLUCOSE 118*  --  116* 105* 105* 129* 117* 131* 114*  BUN 34*  --  44* 48* 54* 24* 35* 44* 55*  CREATININE 7.10*  --  8.64* 9.38* 9.89* 5.09* 6.58* 7.28* 8.15*  CALCIUM 8.1*  --  8.0* 8.1* 8.2* 8.6* 8.2* 8.8* 9.0  MG 1.6*  --  2.1  --  2.0  --  2.0  --  2.3  PHOS 4.9*  --   --  6.1* 5.0* 3.3 2.9  --  3.7   < > = values in this interval not displayed.   GFR: Estimated Creatinine Clearance: 7.3 mL/min (A) (by C-G formula based on SCr of 8.15 mg/dL (H)). Recent Labs  Lab 08/23/20 1400 08/23/20 1809 08/23/20 2040 08/24/20 0323 08/25/20 0431 08/25/20 1351 08/26/20 0432 08/27/20 0423  WBC  --  9.2   < > 9.6 18.1* 16.0* 13.1* 10.4  LATICACIDVEN 6.3* 6.3*  --  1.8  --   --   --   --    < > = values in this interval not displayed.    Liver Function Tests: Recent Labs  Lab 08/23/20 1454 08/24/20 0323 08/25/20 0431 08/25/20 1351 08/26/20 0432 08/26/20 1902 08/27/20 0423 08/28/20 0404  AST 28 456* 136*  --  79*  --  58*  --   ALT 13 332* 160*  --  82*  --  48*  --   ALKPHOS 76 47 53  --  56  --  53  --   BILITOT 0.8 1.0 0.6  --  0.7  --  0.6  --   PROT 5.8* 4.8* 4.8*  --  5.0*  --  4.9*  --   ALBUMIN 3.0* 2.6* 2.4* 2.4* 2.3* 2.4* 2.1* 2.2*   Recent Labs  Lab 08/23/20 1331  LIPASE 26   No results for input(s): AMMONIA in the last 168 hours.  ABG    Component Value Date/Time   PHART 7.486 (H) 08/24/2020 1010   PCO2ART 34.3 08/24/2020 1010   PO2ART 171 (H) 08/24/2020 1010   HCO3 25.9 08/24/2020 1010   TCO2 27 08/24/2020 1010   ACIDBASEDEF 5.0 (H) 08/23/2020 1750   O2SAT 100.0 08/24/2020 1010     Coagulation Profile: Recent Labs  Lab 08/23/20 1454 08/23/20 2035 08/23/20 2040 08/25/20 0431  INR 1.5* 1.4* 1.4* 1.3*    Cardiac Enzymes: No results for input(s): CKTOTAL, CKMB, CKMBINDEX, TROPONINI in the last 168 hours.  HbA1C: No results found for: HGBA1C  CBG: Recent Labs  Lab 08/27/20 2015 08/27/20 2347 08/28/20 0321  08/28/20 0539 08/28/20 0749   GLUCAP 110* 119* 105* 110* 111*     Critical care time: 31 mins    Kara Mead MD. FCCP. [facility] Pager : [phone]  If no response to pager , please call [phone] until 7 pm After 7:00 pm call Elink  [phone]     08/28/2020  9:01 AM

## 2020-08-28 NOTE — Progress Notes (Signed)
Pt became very upset when it was time for his new bag of TNA to be hung; Pt feels that the TNA is the reason he is having frequent stools.  Family is at the bedside;  Pt insisting that the TNA be taken down and stopped;  RN notified the MD;  TNA was stopped and taken back down;  Red port of central line flushed w NS and clamped.

## 2020-08-28 NOTE — Evaluation (Addendum)
Clinical/Bedside Swallow Evaluation Patient Details  Name: Logan Martin MRN: [id number] Date of Birth: 09-17-1955  Today's Date: 08/28/2020 Time: SLP Start Time (ACUTE ONLY): 65 SLP Stop Time (ACUTE ONLY): 1243 SLP Time Calculation (min) (ACUTE ONLY): 12.38 min  Past Medical History:  Past Medical History:  Diagnosis Date  . Alcohol abuse    quit date in 1995  . Anemia   . Cancer ([place])    bladder  . Chronic low back pain   . Cocaine abuse in remission ([place])    Quit date in 1995  . ESRD (end stage renal disease) ([place])    Home Dialysis  . 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 Started HD in [place], [place].  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  On NxStage home hemo since 2010. Using RUA AVF.    [name] Hearing aid worn    B/L  . Hepatitis C    s/p treatment with Mavyret 03/2017.  undetedctable virus in 03/2017  . History of blood transfusion 2007 X 1  . History of kidney stones   . Hx of constipation   . Hypertension   . Left inguinal hernia   . Peripheral vascular disease ([place])   . Pulmonary embolism [facility]) Feb 2012   Treated with coumadin x 1 year  . Wears glasses    Past Surgical History:  Past Surgical History:  Procedure Laterality Date  . A/V FISTULAGRAM N/A 11/22/2017   Procedure: A/V Fistulagram;  Surgeon: Serafina Mitchell, MD;  Location: [place] CV LAB;  Service: Cardiovascular;  Laterality: N/A;  rt. arm   . ABDOMINAL HERNIA REPAIR  2000    x 2  . APPLICATION OF WOUND VAC N/A 08/23/2020   Procedure: APPLICATION OF ABDOMINAL WOUND VAC;  Surgeon: Greer Pickerel, MD;  Location: [place];  Service: General;  Laterality: N/A;  . AV FISTULA PLACEMENT Right 2007   upper arm  . AV FISTULA REPAIR Right ~ 03/2016 & 04/2016  . BLADDER AUGMENTATION  1975  . COLONOSCOPY    . COLONOSCOPY WITH PROPOFOL N/A 08/22/2020   Procedure: COLONOSCOPY WITH PROPOFOL;  Surgeon: Jackquline Denmark, MD;  Location: [facility] ENDOSCOPY;  Service: Endoscopy;   Laterality: N/A;  . CORONARY ANGIOPLASTY WITH STENT PLACEMENT  1999  . CYSTOSCOPY W/ URETERAL STENT PLACEMENT N/A 10/13/2017   Procedure: CYSTOSCOPY WITH IRRIGATION OF BLADDER PLACEMENT OF FOLEY;  Surgeon: Ceasar Mons, MD;  Location: [place];  Service: Urology;  Laterality: N/A;  . CYSTOSCOPY WITH FULGERATION N/A 03/27/2018   Procedure: [place] AND MUCOUS EVACUATION, BLADDER BIOPSY;  Surgeon: Franchot Gallo, MD;  Location: [facility];  Service: Urology;  Laterality: N/A;  . ESOPHAGOGASTRODUODENOSCOPY    . ESOPHAGOGASTRODUODENOSCOPY N/A 08/24/2020   Procedure: ESOPHAGOGASTRODUODENOSCOPY (EGD);  Surgeon: Jackquline Denmark, MD;  Location: [facility] ENDOSCOPY;  Service: Endoscopy;  Laterality: N/A;  . EXTRACORPOREAL SHOCK WAVE LITHOTRIPSY    . FISTULA SUPERFICIALIZATION Right 06/05/1476   Procedure: PLICATION OF ANEURYSM OF ARTERIOVENOUS FISTULA;  Surgeon: Angelia Mould, MD;  Location: [facility];  Service: Vascular;  Laterality: Right;  . HERNIA REPAIR    . INGUINAL HERNIA REPAIR Right 1975  . INGUINAL HERNIA REPAIR Left 03/04/2019   Procedure: LEFT OPEN INGUINAL HERNIA REPAIR WITH MESH;  Surgeon: Kinsinger, Arta Bruce, MD;  Location: [place];  Service: General;  Laterality: Left;  GENERAL AND LMA  . INSERTION OF DIALYSIS CATHETER Left 03/15/2020   Procedure: INSERTION OF Left Internal Jugular DIALYSIS CATHETER;  Surgeon: [name], MD;  Location: [place];  Service: Vascular;  Laterality: Left;  . IR FLUORO GUIDE CV LINE LEFT  03/11/2020  . IR FLUORO GUIDE CV LINE LEFT  08/20/2020  . IR US GUIDE VASC ACCESS LEFT  03/11/2020  . IR US GUIDE VASC ACCESS LEFT  08/20/2020  . IR US GUIDE VASC ACCESS RIGHT  08/20/2020  . IR VENO/JUGULAR RIGHT  08/20/2020  . LAPAROTOMY N/A 08/23/2020   Procedure: EXPLORATORY LAPAROTOMY;  Surgeon: Greer Pickerel, MD;  Location: [place];  Service: General;  Laterality: N/A;  . LAPAROTOMY N/A 08/25/2020   Procedure: RE EXPLORE EXPLORATORY LAPAROTOMY, WASHOUT  AND CLOSURE;  Surgeon: Greer Pickerel, MD;  Location: [place];  Service: General;  Laterality: N/A;  . LEFT HEART CATH AND CORONARY ANGIOGRAPHY N/A 05/06/2020   Procedure: LEFT HEART CATH AND CORONARY ANGIOGRAPHY;  Surgeon: Nigel Mormon, MD;  Location: [place] CV LAB;  Service: Cardiovascular;  Laterality: N/A;  . PERIPHERAL VASCULAR BALLOON ANGIOPLASTY  11/22/2017   Procedure: PERIPHERAL VASCULAR BALLOON ANGIOPLASTY;  Surgeon: Serafina Mitchell, MD;  Location: [place] CV LAB;  Service: Cardiovascular;;  rt. arm fistula  . PERIPHERAL VASCULAR CATHETERIZATION N/A 06/22/2016   Procedure: Left Arm Venography;  Surgeon: Elam Dutch, MD;  Location: [place] CV LAB;  Service: Cardiovascular;  Laterality: N/A;  . PERIPHERAL VASCULAR CATHETERIZATION N/A 06/22/2016   Procedure: A/V Fistulagram - Right Arm;  Surgeon: Elam Dutch, MD;  Location: [place] CV LAB;  Service: Cardiovascular;  Laterality: N/A;  . PERIPHERAL VASCULAR CATHETERIZATION Right 06/22/2016   Procedure: Peripheral Vascular Balloon Angioplasty;  Surgeon: Elam Dutch, MD;  Location: [place] CV LAB;  Service: Cardiovascular;  Laterality: Right;  arm fistula  . POLYPECTOMY  08/22/2020   Procedure: POLYPECTOMY;  Surgeon: Jackquline Denmark, MD;  Location: [facility] ENDOSCOPY;  Service: Endoscopy;;  . REVISON OF ARTERIOVENOUS FISTULA Right 03/15/2020   Procedure: RIGHT ARM ARTERIOVENOUS FISTULA REVISON;  Surgeon: [name], MD;  Location: [place];  Service: Vascular;  Laterality: Right;  . SPLENECTOMY, TOTAL N/A 08/23/2020   Procedure: SPLENECTOMY;  Surgeon: Greer Pickerel, MD;  Location: [place];  Service: General;  Laterality: N/A;   HPI:  Pt is a 65 y.o. male, with PMH of ESRD, HTN, HLD, CAD, history of adenocarcinoma of the bladder, anemia, thrombocytopenia, hx of PE, hepatitis C who presented to the ED on 08/19/2020 with progressive fatigue and generalized weakness with dark stools for 1 week.   Work-up significant for  anemia and melanotic stools. CTA abdomen pelvis 3/26 showed no active source of GI bleeding but showed focal areas of high-grade stenosis within distal branches of the SMA in the right lower quadrant. Esophagram 3/28: No evidence of esophageal leak or other injury. Mild esophageal  dysmotility. No laryngeal penetration or aspiration. CXR 4/1: Persistent left lower lobe collapse. Possible tiny left pleural effusion. Pt s/p colonoscopy on 3/28 which showed diverticulosis, 2 polyps were removed and sent for biopsy but showed no evidence of GI bleed. On 3/29 patient acutely complained of worsening abdominal and chronic back pain, found to have splenic rupture, required massive transfusion and emergent OR. 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 EGD 3/30 - healing Mallory-Weiss tear, grade 1 esophageal varices, small hiatal hernia. Pt intubated for procedure ETT 3/29-4/2; enlarged uvula noted on extubation. Surgery's note on 4/3 recommended clear liquids if clinically indicated based on swallow evaluation.   Assessment / Plan /  Recommendation Clinical Impression  Pt was seen for bedside swallow evaluation and he denied a history of oropharyngeal dysphagia. Pt reported acute odynophagia. Per the pt, all movement and p.o. intake have caused him to want to use the bedpan. He therefore required encouragement to participate in the evaluation. Pt was resistant to thorough oral inspection due to fear of gagging like he has in the past. However, velar and/or uvular edema was noted. Vocal quality was [name], but resonance was mildly nasal. Trials were limited to clear liquids due to recent surgery. Pt was asymptomatic for oropharyngeal dysfunction with ice chips and thin liquids via straw. A clear liquid diet may be initiated at this time as was recommended by surgery. SLP will follow pt. SLP Visit Diagnosis: Dysphagia, unspecified (R13.10)    Aspiration  Risk  Mild aspiration risk    Diet Recommendation Thin liquid (clear liquids)   Liquid Administration via: Cup;Straw Medication Administration: Whole meds with liquid Compensations: Slow rate Postural Changes: Seated upright at 90 degrees    Other  Recommendations Oral Care Recommendations: Oral care BID   Follow up Recommendations  (TBD)      Frequency and Duration min 2x/week  2 weeks       Prognosis Prognosis for Safe Diet Advancement: Good      Swallow Study   General Date of Onset: 08/28/20 HPI: Pt is a 65 y.o. male, with PMH of ESRD, HTN, HLD, CAD, history of adenocarcinoma of the bladder, anemia, thrombocytopenia, hx of PE, hepatitis C who presented to the ED on 08/19/2020 with progressive fatigue and generalized weakness with dark stools for 1 week.   Work-up significant for anemia and melanotic stools. CTA abdomen pelvis 3/26 showed no active source of GI bleeding but showed focal areas of high-grade stenosis within distal branches of the SMA in the right lower quadrant. Esophagram 3/28: No evidence of esophageal leak or other injury. Mild esophageal  dysmotility. No laryngeal penetration or aspiration. CXR 4/1: Persistent left lower lobe collapse. Possible tiny left pleural effusion. Pt s/p colonoscopy on 3/28 which showed diverticulosis, 2 polyps were removed and sent for biopsy but showed no evidence of GI bleed. On 3/29 patient acutely complained of worsening abdominal and chronic back pain, found to have splenic rupture, required massive transfusion and emergent OR. 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 EGD 3/30 - healing Mallory-Weiss tear, grade 1 esophageal varices, small hiatal hernia. Pt intubated for procedure ETT 3/29-4/2; enlarged uvula noted on extubation. Surgery's note on 4/3 recommended clear liquids if clinically indicated based on swallow evaluation. Type of Study: Bedside Swallow  Evaluation Previous Swallow Assessment: none Diet Prior to this Study: NPO Temperature Spikes Noted: No Respiratory Status: Nasal cannula History of Recent Intubation: Yes Length of Intubations (days): 4 days Date extubated: 08/27/20 Behavior/Cognition: Alert;Cooperative;Pleasant mood (Pt required encouragement for cooperation) Oral Cavity Assessment: Edema Oral Care Completed by SLP: No Oral Cavity - Dentition: Adequate natural dentition Vision: Functional for self-feeding Self-Feeding Abilities: Needs assist Patient Positioning: Upright in bed;Postural control adequate for testing Baseline Vocal Quality:  (mildly increased nasality) Volitional Cough: Strong Volitional Swallow: Able to elicit    Oral/Motor/Sensory Function Overall Oral Motor/Sensory Function: Within functional limits (edematous velum)   Ice Chips Ice chips: Within functional limits Presentation: Spoon   Thin Liquid Thin Liquid: Within functional limits Presentation: Straw    Nectar Thick Nectar Thick Liquid: Not tested   Honey Thick Honey Thick Liquid: Not tested   Puree  Puree: Not tested   Solid     Solid: Not tested     Tobie Poet I. Hardin Negus, [place], [place] Office number [phone] Pager [phone]  Horton Marshall 08/28/2020,1:41 PM

## 2020-08-29 DIAGNOSIS — N186 End stage renal disease: Secondary | ICD-10-CM | POA: Diagnosis not present

## 2020-08-29 DIAGNOSIS — J9601 Acute respiratory failure with hypoxia: Secondary | ICD-10-CM | POA: Diagnosis not present

## 2020-08-29 LAB — COMPREHENSIVE METABOLIC PANEL
ALT: 25 U/L (ref 0–44)
AST: 39 U/L (ref 15–41)
Albumin: 2 g/dL — ABNORMAL LOW (ref 3.5–5.0)
Alkaline Phosphatase: 67 U/L (ref 38–126)
Anion gap: 10 (ref 5–15)
BUN: 37 mg/dL — ABNORMAL HIGH (ref 8–23)
CO2: 29 mmol/L (ref 22–32)
Calcium: 8.7 mg/dL — ABNORMAL LOW (ref 8.9–10.3)
Chloride: 100 mmol/L (ref 98–111)
Creatinine, Ser: 6.32 mg/dL — ABNORMAL HIGH (ref 0.61–1.24)
GFR, Estimated: 9 mL/min — ABNORMAL LOW (ref >60)
Glucose, Bld: 82 mg/dL (ref 70–99)
Potassium: 3.7 mmol/L (ref 3.5–5.1)
Sodium: 139 mmol/L (ref 135–145)
Total Bilirubin: 0.6 mg/dL (ref 0.3–1.2)
Total Protein: 5 g/dL — ABNORMAL LOW (ref 6.5–8.1)

## 2020-08-29 LAB — RENAL FUNCTION PANEL
Albumin: 2.3 g/dL — ABNORMAL LOW (ref 3.5–5.0)
Anion gap: 13 (ref 5–15)
BUN: 46 mg/dL — ABNORMAL HIGH (ref 8–23)
CO2: 27 mmol/L (ref 22–32)
Calcium: 9.1 mg/dL (ref 8.9–10.3)
Chloride: 97 mmol/L — ABNORMAL LOW (ref 98–111)
Creatinine, Ser: 7.53 mg/dL — ABNORMAL HIGH (ref 0.61–1.24)
GFR, Estimated: 7 mL/min — ABNORMAL LOW (ref >60)
Glucose, Bld: 87 mg/dL (ref 70–99)
Phosphorus: 4.4 mg/dL (ref 2.5–4.6)
Potassium: 3.7 mmol/L (ref 3.5–5.1)
Sodium: 137 mmol/L (ref 135–145)

## 2020-08-29 LAB — CBC
HCT: 24 % — ABNORMAL LOW (ref 39.0–52.0)
Hemoglobin: 7.6 g/dL — ABNORMAL LOW (ref 13.0–17.0)
MCH: 30.5 pg (ref 26.0–34.0)
MCHC: 31.7 g/dL (ref 30.0–36.0)
MCV: 96.4 fL (ref 80.0–100.0)
Platelets: 233 10*3/uL (ref 150–400)
RBC: 2.49 MIL/uL — ABNORMAL LOW (ref 4.22–5.81)
RDW: 17.3 % — ABNORMAL HIGH (ref 11.5–15.5)
WBC: 10.9 10*3/uL — ABNORMAL HIGH (ref 4.0–10.5)
nRBC: 2.7 % — ABNORMAL HIGH (ref 0.0–0.2)

## 2020-08-29 LAB — DIFFERENTIAL
Abs Immature Granulocytes: 0.06 10*3/uL (ref 0.00–0.07)
Basophils Absolute: 0 10*3/uL (ref 0.0–0.1)
Basophils Relative: 0 %
Eosinophils Absolute: 0.3 10*3/uL (ref 0.0–0.5)
Eosinophils Relative: 3 %
Immature Granulocytes: 1 %
Lymphocytes Relative: 5 %
Lymphs Abs: 0.6 10*3/uL — ABNORMAL LOW (ref 0.7–4.0)
Monocytes Absolute: 1.5 10*3/uL — ABNORMAL HIGH (ref 0.1–1.0)
Monocytes Relative: 14 %
Neutro Abs: 8.4 10*3/uL — ABNORMAL HIGH (ref 1.7–7.7)
Neutrophils Relative %: 77 %

## 2020-08-29 LAB — PREALBUMIN: Prealbumin: 16.4 mg/dL — ABNORMAL LOW (ref 18–38)

## 2020-08-29 LAB — HEMOGLOBIN A1C
Hgb A1c MFr Bld: 5.5 % (ref 4.8–5.6)
Mean Plasma Glucose: 111.15 mg/dL

## 2020-08-29 LAB — GLUCOSE, CAPILLARY
Glucose-Capillary: 84 mg/dL (ref 70–99)
Glucose-Capillary: 88 mg/dL (ref 70–99)
Glucose-Capillary: 92 mg/dL (ref 70–99)
Glucose-Capillary: 87 mg/dL (ref 70–99)
Glucose-Capillary: 85 mg/dL (ref 70–99)
Glucose-Capillary: 73 mg/dL (ref 70–99)

## 2020-08-29 LAB — PHOSPHORUS: Phosphorus: 3.5 mg/dL (ref 2.5–4.6)

## 2020-08-29 LAB — HEMOGLOBIN AND HEMATOCRIT, BLOOD
HCT: 27.7 % — ABNORMAL LOW (ref 39.0–52.0)
Hemoglobin: 9.1 g/dL — ABNORMAL LOW (ref 13.0–17.0)

## 2020-08-29 LAB — PREPARE RBC (CROSSMATCH)

## 2020-08-29 LAB — CORTISOL: Cortisol, Plasma: 15.3 ug/dL

## 2020-08-29 LAB — MAGNESIUM: Magnesium: 2.2 mg/dL (ref 1.7–2.4)

## 2020-08-29 LAB — PATHOLOGIST SMEAR REVIEW

## 2020-08-29 LAB — TRIGLYCERIDES: Triglycerides: 135 mg/dL (ref <150)

## 2020-08-29 MED ORDER — CHLORHEXIDINE GLUCONATE 0.12 % MT SOLN
OROMUCOSAL | Status: AC
  Filled 2020-08-29: qty 15

## 2020-08-29 MED ORDER — ORAL CARE MOUTH RINSE
15.0000 mL | Freq: Two times a day (BID) | OROMUCOSAL | Status: DC
  Administered 2020-08-29 – 2020-08-31 (×4): 15 mL via OROMUCOSAL

## 2020-08-29 MED ORDER — LACTATED RINGERS IV BOLUS
500.0000 mL | Freq: Once | INTRAVENOUS | Status: AC
  Administered 2020-08-29: 500 mL via INTRAVENOUS

## 2020-08-29 MED ORDER — SODIUM CHLORIDE 0.9% IV SOLUTION: Freq: Once | INTRAVENOUS | Status: AC

## 2020-08-29 MED ORDER — PROSOURCE PLUS PO LIQD
30.0000 mL | Freq: Two times a day (BID) | ORAL | Status: DC
  Administered 2020-08-29 – 2020-08-31 (×4): 30 mL via ORAL
  Filled 2020-08-29 (×6): qty 30

## 2020-08-29 MED ORDER — BOOST / RESOURCE BREEZE PO LIQD CUSTOM
1.0000 | Freq: Three times a day (TID) | ORAL | Status: DC
  Administered 2020-08-29 – 2020-08-30 (×2): 1 via ORAL

## 2020-08-29 MED ORDER — INSULIN ASPART 100 UNIT/ML ~~LOC~~ SOLN: 0.0000 [IU] | Freq: Three times a day (TID) | SUBCUTANEOUS | Status: DC

## 2020-08-29 MED ORDER — DARBEPOETIN ALFA 200 MCG/0.4ML IJ SOSY
200.0000 ug | PREFILLED_SYRINGE | INTRAMUSCULAR | Status: DC
  Filled 2020-08-29: qty 0.4

## 2020-08-29 NOTE — Progress Notes (Signed)
NAME:  Logan Martin, MRN:  [id number], DOB:  18-Mar-1956, LOS: 9 ADMISSION DATE:  08/19/2020, CONSULTATION DATE:  08/23/2020 REFERRING MD:  Dr. Waldron Labs, CHIEF COMPLAINT:  Acute on Chronic Anemia   History of Present Illness:  65 year old male with prior history of ESRD, HTN, HLD,CAD, history of adenocarcinoma of the bladder,anemia, thrombocytopenia, hx of PE, hepatitis C presenting on 3/25 with progressive fatigue and generalized weakness with dark stools for 1 week.   Work-up significant for anemia and melanotic stools.  CTA abdomen pelvis showed no active source of GI bleeding.  It did note focal areas of high-grade stenosis within distal branches of the SMA in the right lower quadrant. Due to poor IV access, a central line was placed by IR.  Nephrology and [facility] consulted.  Underwent colonoscopy on 3/28 which showed diverticulosis, 2 polyps were removed and sent for biopsy but showed no evidence of GI bleed.  Patient has required ongoing transfusion throughout hospitalization for ongoing anemia of unclear source.  On 3/29 patient acutely complained of worsening abdominal and chronic back pain, found to have splenic rupture, required massive transfusion and emergent OR   Pertinent  Medical History  ESRD (on home HD MTTF), HTN, HLD,CAD, history of adenocarcinoma of the bladder,anemia, thrombocytopenia, hx of PE, hepatitis C Hard of hearing   Significant Hospital Events: Including procedures, antibiotic start and stop dates in addition to other pertinent events    3/25 admitted to [facility], nephrology following   3/26 L IJ DL CVL placed by IR (of note, unable to pass via right IJ due to occlusion and lower right IJ at venous stent)  3/27 [facility] consulted  3/28 colonoscopy by Dr. Lyndel Safe with two polyps removed, required intubation during procedure for flash pulmonary edema; extubated post procedure  3/29 transfused 4th unit PRBC this am.  During afternoon, acute decompensation/  hypotension on floor -> RRT, vasopresors started, stat CT abd/ pelvis and surgical consult -> intubated, then to OR with ruptured spleen s/p MTP and splenectomy  3/30 EGD 3/30 - healing Mallory-Weiss tear, grade 1 esophageal varices, small hiatal hernia  3/31 OR >> washout & abd closure  4/1off vasopressin and Levophed , hemodialysis, started tube feeds  4/3 patient requested discontinuation of tube feeds, started on CLD  3/26 L IJ CVL>> 3/29 ETT >>4/2 3/29 R femoral Aline >> 4/2 3/29 L femoral cordis >> 4/1  Interim History / Subjective:  Feeling well today. No acute complaints. No further delirium, reports sleeping well with home Trazodone last night.  Daughter at bedside.   Objective   Blood pressure (!) 108/43, pulse 81, temperature 97.9 F (36.6 C), temperature source Oral, resp. rate 18, height 5\' 4"  (1.626 m), weight 58 kg, SpO2 93 %.        Intake/Output Summary (Last 24 hours) at 08/29/2020 1049 Last data filed at 08/29/2020 0919 Gross per 24 hour  Intake 927.54 ml  Output 1549 ml  Net -621.46 ml   Filed Weights   08/28/20 0400 08/28/20 1245 08/28/20 1550  Weight: 56.1 kg 59.1 kg 58 kg    Physical Exam Vitals reviewed.  Constitutional:      General: He is not in acute distress.    Comments: Appears comfortable lying in bed.   Cardiovascular:     Rate and Rhythm: Normal rate and regular rhythm.     Heart sounds: Normal heart sounds.  Pulmonary:     Comments: Breathing comfortably on RA.  Musculoskeletal:     Right lower  leg: No edema.     Left lower leg: No edema.  Skin:    General: Skin is warm and dry.  Neurological:     General: No focal deficit present.     Mental Status: He is alert.      Labs/imaging that I havepersonally reviewed  (right click and "Reselect all SmartList Selections" daily)  Glucose 106>81>84>88>92 Hemoglobin 8.7>7.6  3/26 CTA abd/ pelvis >> VASCULAR 1. No evidence of active gastrointestinal hemorrhage. 2. Extensive  atherosclerosis of the aorta and its branches. Focal areas of high-grade stenosis within distal branches of the SMA in the right lower quadrant. High-grade bilateral renal artery stenosis, left greater than right NON-VASCULAR 1. Numerous bilateral hepatic cysts unchanged. 2. Bilateral renal cortical atrophy with diffuse cystic change, consistent with known end-stage renal disease. 3. No acute intra-abdominal or intrapelvic process. 4. Aortic Atherosclerosis and Emphysema   3/28 colonoscopy >> One 6 mm polyp in the proximal transverse colon, removed with a cold snare. Resected and retrieved.  One 6 mm polyp in the mid descending colon, removed with a cold snare. Resected and retrieved.  Diverticulosis in the sigmoid colon and in the ascending colon.  Non-bleeding internal hemorrhoids.  4/1 CXR with left pleural effusion   Resolved Hospital Problem list     Assessment & Plan:  Acute on Chronic Anemia Hgb 8.7>7.6. Still with melenotic stool in collection bag, possible that there is ongoing occult blood loss. Likely that he is still relatively volume down.  - Will transfuse with 1 unit RBCs - Follow hemoglobin with daily labs  - Physical therapy  Shock- resolved Chronic hypertension Transient hypotension with dialysis.  BPs high during the day, becoming hypotensive with dialysis which he reports to be a longstanding issue. Dialysis cut short yesterday due to hypotension. Last QTc 532 on 4/1.  - Nephro following  - Will recheck ECG, if QTc improved will add midodrine   Splenic rupture 3/29 S/p ex-lap w/ splenectomy 3/29 and washout + abdominal closure 4/1. Drain in place. Surgery following. - Will defer to surgery on diet recommendations, currently CLD - Follow glucose now that tube feeds stopped, may need to add D5 if they continue to drop   ESRD on HD On home dialysis, nephro following in hospital. Dialysis cut short yesterday for hypotension as above. Electrolytes consistent with  incomplete dialysis.  - Plan for dialysis today   ICU Delirium Much improved today after receiving home trazodone overnight. - Continue trazodone - Reorientation as needed    Best practice (right click and "Reselect all SmartList Selections" daily)  Diet:  Oral Pain/Anxiety/Delirium protocol (if indicated): on home oxycododne VAP protocol (if indicated): Not indicated DVT prophylaxis: Contraindicated GI prophylaxis: PPI Glucose control:  SSI Yes Central venous access:  Yes, and it is still needed Arterial line:  N/A Foley:  N/A Mobility:  OOB  PT consulted: Yes Last date of multidisciplinary goals of care discussion [4/1] Code Status:  full code Disposition: Hopeful for transfer to floor today if BP improves   Labs   CBC: Recent Labs  Lab 08/23/20 1331 08/23/20 1454 08/25/20 1351 08/26/20 0432 08/27/20 0423 08/28/20 1029 08/29/20 0120  WBC 4.5   < > 16.0* 13.1* 10.4 11.8* 10.9*  NEUTROABS 3.1  --  12.6* 10.7*  --   --  8.4*  HGB 4.6*   < > 8.4* 8.1* 7.4* 8.7* 7.6*  HCT 14.1*   < > 24.6* 24.0* 22.2* 26.8* 24.0*  MCV 95.3   < > 90.1 92.3  92.1 95.0 96.4  PLT 103*   < > 139* 155 163 244 233   < > = values in this interval not displayed.    Basic Metabolic Panel: Recent Labs  Lab 08/25/20 0431 08/25/20 1351 08/26/20 0432 08/26/20 1902 08/27/20 0423 08/27/20 1434 08/28/20 0404 08/28/20 1535 08/29/20 0120  NA 132*   < > 131* 136 135 137 137 139 139  K 4.6   < > 4.0 3.2* 2.9* 3.7 3.6 3.4* 3.7  CL 97*   < > 98 101 100 99 99 100 100  CO2 22   < > 20* 26 24 26 27 29 29   GLUCOSE 116*   < > 105* 129* 117* 131* 114* 125* 82  BUN 44*   < > 54* 24* 35* 44* 55* 20 37*  CREATININE 8.64*   < > 9.89* 5.09* 6.58* 7.28* 8.15* 3.33* 6.32*  CALCIUM 8.0*   < > 8.2* 8.6* 8.2* 8.8* 9.0 8.8* 8.7*  MG 2.1  --  2.0  --  2.0  --  2.3  --  2.2  PHOS  --    < > 5.0* 3.3 2.9  --  3.7 1.6* 3.5   < > = values in this interval not displayed.   GFR: Estimated Creatinine Clearance: 9.7  mL/min (A) (by C-G formula based on SCr of 6.32 mg/dL (H)). Recent Labs  Lab 08/23/20 1400 08/23/20 1809 08/23/20 2040 08/24/20 0323 08/25/20 0431 08/26/20 0432 08/27/20 0423 08/28/20 1029 08/29/20 0120  WBC  --  9.2   < > 9.6   < > 13.1* 10.4 11.8* 10.9*  LATICACIDVEN 6.3* 6.3*  --  1.8  --   --   --   --   --    < > = values in this interval not displayed.    Liver Function Tests: Recent Labs  Lab 08/24/20 0323 08/25/20 0431 08/25/20 1351 08/26/20 0432 08/26/20 1902 08/27/20 0423 08/28/20 0404 08/28/20 1535 08/29/20 0120  AST 456* 136*  --  79*  --  58*  --   --  39  ALT 332* 160*  --  82*  --  48*  --   --  25  ALKPHOS 47 53  --  56  --  53  --   --  67  BILITOT 1.0 0.6  --  0.7  --  0.6  --   --  0.6  PROT 4.8* 4.8*  --  5.0*  --  4.9*  --   --  5.0*  ALBUMIN 2.6* 2.4*   < > 2.3* 2.4* 2.1* 2.2* 2.3* 2.0*   < > = values in this interval not displayed.   Recent Labs  Lab 08/23/20 1331  LIPASE 26   No results for input(s): AMMONIA in the last 168 hours.  ABG    Component Value Date/Time   PHART 7.486 (H) 08/24/2020 1010   PCO2ART 34.3 08/24/2020 1010   PO2ART 171 (H) 08/24/2020 1010   HCO3 25.9 08/24/2020 1010   TCO2 27 08/24/2020 1010   ACIDBASEDEF 5.0 (H) 08/23/2020 1750   O2SAT 100.0 08/24/2020 1010     Coagulation Profile: Recent Labs  Lab 08/23/20 1454 08/23/20 2035 08/23/20 2040 08/25/20 0431  INR 1.5* 1.4* 1.4* 1.3*    Cardiac Enzymes: No results for input(s): CKTOTAL, CKMB, CKMBINDEX, TROPONINI in the last 168 hours.  HbA1C: No results found for: HGBA1C  CBG: Recent Labs  Lab 08/28/20 1804 08/28/20 2337 08/29/20 0346 08/29/20 0504 08/29/20 0754  GLUCAP  106* 81 84 88 92    Review of Systems:   Review of Systems  Constitutional: Negative.  Negative for fever.  Respiratory: Negative for shortness of breath and wheezing.   Cardiovascular: Negative for palpitations and leg swelling.  Gastrointestinal: Positive for diarrhea  and melena. Negative for abdominal pain.  Psychiatric/Behavioral: The patient has insomnia.   All other systems reviewed and are negative.    Past Medical History:  He,  has a past medical history of Alcohol abuse, Anemia, Cancer ([place]), Chronic low back pain, Cocaine abuse in remission ([place]), ESRD (end stage renal disease) ([place]), ESRD on dialysis (02/16/2009), Hearing aid worn, Hepatitis C, History of blood transfusion (2007 X 1), History of kidney stones, constipation, Hypertension, Left inguinal hernia, Peripheral vascular disease ([place]), Pulmonary embolism [facility]) (Feb 2012), and Wears glasses.   Surgical History:   Past Surgical History:  Procedure Laterality Date  . A/V FISTULAGRAM N/A 11/22/2017   Procedure: A/V Fistulagram;  Surgeon: Serafina Mitchell, MD;  Location: [place] [facility];  Service: Cardiovascular;  Laterality: N/A;  rt. arm   . ABDOMINAL HERNIA REPAIR  2000    x 2  . APPLICATION OF WOUND VAC N/A 08/23/2020   Procedure: APPLICATION OF ABDOMINAL WOUND VAC;  Surgeon: Greer Pickerel, MD;  Location: [place];  Service: General;  Laterality: N/A;  . AV FISTULA PLACEMENT Right 2007   upper arm  . AV FISTULA REPAIR Right ~ 03/2016 & 04/2016  . BLADDER AUGMENTATION  1975  . COLONOSCOPY    . COLONOSCOPY WITH PROPOFOL N/A 08/22/2020   Procedure: COLONOSCOPY WITH PROPOFOL;  Surgeon: Jackquline Denmark, MD;  Location: [facility] ENDOSCOPY;  Service: Endoscopy;  Laterality: N/A;  . CORONARY ANGIOPLASTY WITH STENT PLACEMENT  1999  . CYSTOSCOPY W/ URETERAL STENT PLACEMENT N/A 10/13/2017   Procedure: CYSTOSCOPY WITH IRRIGATION OF BLADDER PLACEMENT OF FOLEY;  Surgeon: Ceasar Mons, MD;  Location: [place];  Service: Urology;  Laterality: N/A;  . CYSTOSCOPY WITH FULGERATION N/A 03/27/2018   Procedure: [place] AND MUCOUS EVACUATION, BLADDER BIOPSY;  Surgeon: Franchot Gallo, MD;  Location: [facility];  Service: Urology;  Laterality: N/A;  . ESOPHAGOGASTRODUODENOSCOPY    .  ESOPHAGOGASTRODUODENOSCOPY N/A 08/24/2020   Procedure: ESOPHAGOGASTRODUODENOSCOPY (EGD);  Surgeon: Jackquline Denmark, MD;  Location: [facility] ENDOSCOPY;  Service: Endoscopy;  Laterality: N/A;  . EXTRACORPOREAL SHOCK WAVE LITHOTRIPSY    . FISTULA SUPERFICIALIZATION Right 01/28/2670   Procedure: PLICATION OF ANEURYSM OF ARTERIOVENOUS FISTULA;  Surgeon: Angelia Mould, MD;  Location: [place];  Service: Vascular;  Laterality: Right;  . HERNIA REPAIR    . INGUINAL HERNIA REPAIR Right 1975  . INGUINAL HERNIA REPAIR Left 03/04/2019   Procedure: LEFT OPEN INGUINAL HERNIA REPAIR WITH MESH;  Surgeon: Kinsinger, Arta Bruce, MD;  Location: [place];  Service: General;  Laterality: Left;  GENERAL AND LMA  . INSERTION OF DIALYSIS CATHETER Left 03/15/2020   Procedure: INSERTION OF Left Internal Jugular DIALYSIS CATHETER;  Surgeon: [name], MD;  Location: [place];  Service: Vascular;  Laterality: Left;  . IR FLUORO GUIDE CV LINE LEFT  03/11/2020  . IR FLUORO GUIDE CV LINE LEFT  08/20/2020  . IR US GUIDE VASC ACCESS LEFT  03/11/2020  . IR US GUIDE VASC ACCESS LEFT  08/20/2020  . IR US GUIDE VASC ACCESS RIGHT  08/20/2020  . IR VENO/JUGULAR RIGHT  08/20/2020  . LAPAROTOMY N/A 08/23/2020   Procedure: EXPLORATORY LAPAROTOMY;  Surgeon: Greer Pickerel, MD;  Location: [place];  Service: General;  Laterality: N/A;  . LAPAROTOMY N/A 08/25/2020   Procedure: RE EXPLORE EXPLORATORY LAPAROTOMY, WASHOUT AND CLOSURE;  Surgeon: Greer Pickerel, MD;  Location: [place];  Service: General;  Laterality: N/A;  . LEFT HEART CATH AND CORONARY ANGIOGRAPHY N/A 05/06/2020   Procedure: LEFT HEART CATH AND CORONARY ANGIOGRAPHY;  Surgeon: Nigel Mormon, MD;  Location: [place] [facility];  Service: Cardiovascular;  Laterality: N/A;  . PERIPHERAL VASCULAR BALLOON ANGIOPLASTY  11/22/2017   Procedure: PERIPHERAL VASCULAR BALLOON ANGIOPLASTY;  Surgeon: Serafina Mitchell, MD;  Location: [place] [facility];  Service: Cardiovascular;;  rt. arm fistula  .  PERIPHERAL VASCULAR CATHETERIZATION N/A 06/22/2016   Procedure: Left Arm Venography;  Surgeon: Elam Dutch, MD;  Location: [place] [facility];  Service: Cardiovascular;  Laterality: N/A;  . PERIPHERAL VASCULAR CATHETERIZATION N/A 06/22/2016   Procedure: A/V Fistulagram - Right Arm;  Surgeon: Elam Dutch, MD;  Location: [place] [facility];  Service: Cardiovascular;  Laterality: N/A;  . PERIPHERAL VASCULAR CATHETERIZATION Right 06/22/2016   Procedure: Peripheral Vascular Balloon Angioplasty;  Surgeon: Elam Dutch, MD;  Location: [place] [facility];  Service: Cardiovascular;  Laterality: Right;  arm fistula  . POLYPECTOMY  08/22/2020   Procedure: POLYPECTOMY;  Surgeon: Jackquline Denmark, MD;  Location: [facility] ENDOSCOPY;  Service: Endoscopy;;  . REVISON OF ARTERIOVENOUS FISTULA Right 03/15/2020   Procedure: RIGHT ARM ARTERIOVENOUS FISTULA REVISON;  Surgeon: [name], MD;  Location: [place];  Service: Vascular;  Laterality: Right;  . SPLENECTOMY, TOTAL N/A 08/23/2020   Procedure: SPLENECTOMY;  Surgeon: Greer Pickerel, MD;  Location: [place];  Service: General;  Laterality: N/A;     Social History:   reports that he quit smoking about 27 years ago. His smoking use included cigarettes. He has a 5.00 pack-year smoking history. He has never used smokeless tobacco. He reports previous alcohol use. He reports previous drug use. Drugs: Marijuana and Cocaine.   Family History:  His family history includes Cancer in his mother; Deep vein thrombosis in his daughter; Diabetes in his mother; Hypertension in his mother.   Allergies Allergies  Allergen Reactions  . Heparin Rash    Pork products   . Pork-Derived Products Rash  . Sulfa Antibiotics Rash     Home Medications  Prior to Admission medications   Medication Sig Start Date End Date Taking? Authorizing Provider  amLODipine (NORVASC) 10 MG tablet Take 1 tablet (10 mg total) by mouth daily. 03/18/20  Yes Aline August, MD  aspirin EC 81 MG tablet  Take 81 mg by mouth at bedtime.    Yes [provider]  atorvastatin (LIPITOR) 40 MG tablet Take 40 mg by mouth daily. 01/20/20  Yes [provider]  AURYXIA 1 GM 210 MG(Fe) tablet Take 420 mg by mouth 3 (three) times daily. 02/12/20  Yes [provider]  hydrALAZINE (APRESOLINE) 25 MG tablet Take 1 tablet (25 mg total) by mouth 3 (three) times daily. 05/07/20  Yes Bonnell Public, MD  hydrOXYzine (VISTARIL) 50 MG capsule Take 50 mg by mouth daily at 6 (six) AM. 08/17/20  Yes [provider]  isosorbide mononitrate (IMDUR) 30 MG 24 hr tablet Take 30 mg by mouth daily.  08/15/18 08/19/20 Yes [provider]  labetalol (NORMODYNE) 100 MG tablet Take 100 mg by mouth daily. 08/03/20  Yes [provider]  lidocaine-prilocaine (EMLA) cream Apply 1 application topically See admin instructions. Apply small amount to dialysis port (AVF) one hour before dialysis. Cover with occlusive  dressing (saran wrap) 11/14/17  Yes [provider]  LOKELMA 5 g packet Take 1 packet by mouth daily. 08/03/20  Yes [provider]  methocarbamol (ROBAXIN) 750 MG tablet Take 750 mg by mouth every 8 (eight) hours as needed for muscle spasms. 08/12/20  Yes [provider]  oxyCODONE (OXY IR/ROXICODONE) 5 MG immediate release tablet Take 5 mg by mouth 2 (two) times daily as needed for moderate pain. 04/29/20  Yes [provider]  sucralfate (CARAFATE) 1 g tablet Take 1 g by mouth daily. 08/12/20  Yes [provider]  traZODone (DESYREL) 150 MG tablet Take 150 mg by mouth daily as needed. 08/17/20  Yes [provider]  Vitamin D, Ergocalciferol, (DRISDOL) 1.25 MG (50000 UNIT) CAPS capsule Take 50,000 Units by mouth once a week. 08/03/20  Yes [provider]     Critical care time: 35 min

## 2020-08-29 NOTE — Progress Notes (Signed)
Patient ID: Logan Martin, male   DOB: 02/08/1956, 64 y.o.   MRN: [id number]   Acute Care Surgery Service Progress Note:    Chief Complaint/Subjective: Cc feeling hot while drinking broth/coffee. Endorses abdominal pain and back pain that improve with medications. Have frequent, dark, stools with rectal tube in place. Tolerating CLD without nausea or emesis. Going to HD today - per chart review HD yesterday was cut short due to hypotension.  Objective: Vital signs in last 24 hours: Temp:  [97.6 F (36.4 C)-97.9 F (36.6 C)] 97.9 F (36.6 C) (04/04 0755) Pulse Rate:  [77-108] 81 (04/04 0500) Resp:  [18] 18 (04/03 1550) BP: (71-175)/(15-141) 108/43 (04/04 0500) SpO2:  [85 %-100 %] 93 % (04/04 0500) Weight:  [58 kg-59.1 kg] 58 kg (04/03 1550) Last BM Date: 08/29/20  Intake/Output from previous day: 04/03 0701 - 04/04 0700 In: 1234.5 [P.O.:200; I.V.:757.4; IV Piggyback:277.1] Out: 1569 [Drains:40; Stool:400] Intake/Output this shift: No intake/output data recorded.  Lungs:  Nonlabored  Cardiovascular: reg  Abd: soft, mild distension, approp TTP, drain-serosang, midline wound clean and open and packed. Rectal tube in place - melanotic stool (400 cc + 3x undocumented charted in epic)   Blake drain - 40 cc/24h  Neuro: alert, nonfocal  Lab Results: CBC  Recent Labs    08/28/20 1029 08/29/20 0120  WBC 11.8* 10.9*  HGB 8.7* 7.6*  HCT 26.8* 24.0*  PLT 244 233   BMET Recent Labs    08/28/20 1535 08/29/20 0120  NA 139 139  K 3.4* 3.7  CL 100 100  CO2 29 29  GLUCOSE 125* 82  BUN 20 37*  CREATININE 3.33* 6.32*  CALCIUM 8.8* 8.7*   LFT Hepatic Function Latest Ref Rng & Units 08/29/2020 08/28/2020 08/28/2020  Total Protein 6.5 - 8.1 g/dL 5.0(L) - -  Albumin 3.5 - 5.0 g/dL 2.0(L) 2.3(L) 2.2(L)  AST 15 - 41 U/L 39 - -  ALT 0 - 44 U/L 25 - -  Alk Phosphatase 38 - 126 U/L 67 - -  Total Bilirubin 0.3 - 1.2 mg/dL 0.6 - -  Bilirubin, Direct 0.0 - 0.2 mg/dL - - -     Studies/Results:  Anti-infectives: Anti-infectives (From admission, onward)   Start     Dose/Rate Route Frequency Ordered Stop   08/24/20 1200  vancomycin (VANCOREADY) IVPB 500 mg/100 mL  Status:  Discontinued        500 mg 100 mL/hr over 60 Minutes Intravenous Every M-W-F (Hemodialysis) 08/23/20 1724 08/23/20 2039   08/24/20 1200  vancomycin (VANCOREADY) IVPB 500 mg/100 mL  Status:  Discontinued        500 mg 100 mL/hr over 60 Minutes Intravenous Every M-W-F (Hemodialysis) 08/23/20 2052 08/26/20 0826   08/23/20 2145  vancomycin (VANCOREADY) IVPB 1000 mg/200 mL  Status:  Discontinued        1,000 mg 200 mL/hr over 60 Minutes Intravenous  Once 08/23/20 2053 08/23/20 2055   08/23/20 2100  piperacillin-tazobactam (ZOSYN) IVPB 2.25 g  Status:  Discontinued        2.25 g 100 mL/hr over 30 Minutes Intravenous Every 8 hours 08/23/20 2052 08/27/20 1254   08/23/20 1730  vancomycin (VANCOREADY) IVPB 1000 mg/200 mL  Status:  Discontinued        1,000 mg 200 mL/hr over 60 Minutes Intravenous  Once 08/23/20 1723 08/23/20 2101   08/23/20 1730  piperacillin-tazobactam (ZOSYN) IVPB 2.25 g  Status:  Discontinued        2.25 g 100 mL/hr over 30   Minutes Intravenous Every 8 hours 08/23/20 1726 08/23/20 2039   08/23/20 1430  piperacillin-tazobactam (ZOSYN) IVPB 3.375 g  Status:  Discontinued        3.375 g 12.5 mL/hr over 240 Minutes Intravenous Every 12 hours 08/23/20 1427 08/23/20 1726   08/20/20 0415  amoxicillin-clavulanate (AUGMENTIN) 500-125 MG per tablet 500 mg  Status:  Discontinued        1 tablet Oral Daily 08/20/20 0329 08/21/20 1258   08/19/20 2345  Ampicillin-Sulbactam (UNASYN) 3 g in sodium chloride 0.9 % 100 mL IVPB  Status:  Discontinued        3 g 200 mL/hr over 30 Minutes Intravenous  Once 08/19/20 2333 08/20/20 0329      Medications: Scheduled Meds: . sodium chloride   Intravenous Once  . sodium chloride   Intravenous Once  . amLODipine  10 mg Oral Daily  . calcitRIOL  0.25  mcg Oral Daily  . chlorhexidine gluconate (MEDLINE KIT)  15 mL Mouth Rinse BID  . Chlorhexidine Gluconate Cloth  6 each Topical Q0600  . Chlorhexidine Gluconate Cloth  6 each Topical Q0600  . ferric citrate  420 mg Oral TID WC  . hydrALAZINE  25 mg Oral TID  . insulin aspart  0-9 Units Subcutaneous Q6H  . labetalol  100 mg Oral BID  . mouth rinse  15 mL Mouth Rinse 10 times per day  . pantoprazole  40 mg Intravenous Q12H  . sodium chloride flush  10-40 mL Intracatheter Q12H  . traZODone  150 mg Oral QHS   Continuous Infusions: . sodium chloride    . sodium chloride    . sodium chloride Stopped (08/25/20 2128)  . sodium chloride    . sodium chloride    . TPN ADULT (ION) Stopped (08/28/20 1754)   PRN Meds:.sodium chloride, sodium chloride, sodium chloride, sodium chloride, sodium chloride, acetaminophen, alteplase, alteplase, fentaNYL (SUBLIMAZE) injection, heparin, labetalol, lidocaine (PF), lidocaine (PF), lidocaine, lidocaine-prilocaine, lidocaine-prilocaine, ondansetron **OR** ondansetron (ZOFRAN) IV, oxyCODONE, pentafluoroprop-tetrafluoroeth, pentafluoroprop-tetrafluoroeth, sodium chloride, sodium chloride flush  Assessment/Plan: Patient Active Problem List   Diagnosis Date Noted  . Pressure injury of skin 08/28/2020  . Abdominal pain   . Acute respiratory failure ([place])   . Tachycardia 08/20/2020  . Oral infection 08/20/2020  . Non-ST elevation (NSTEMI) myocardial infarction ([place])   . ESRD (end stage renal disease) on dialysis ([place]) 05/05/2020  . Prolonged QT interval 05/05/2020  . Allergy, unspecified, sequela 03/18/2020  . Pain, unspecified 03/18/2020  . Personal history of anaphylaxis 03/18/2020  . Cellulitis 03/10/2020  . Chronic low back pain   . Peripheral vascular disease ([place])   . Other mechanical complication of surgically created arteriovenous fistula, initial encounter ([place]) 03/09/2020  . Unspecified open wound of right upper arm, subsequent encounter 03/09/2020   . Hypercalcemia 01/20/2020  . Other disorders of phosphorus metabolism 01/01/2020  . Severe mitral regurgitation 12/22/2018  . Hypercholesterolemia 12/22/2018  . History of bladder cancer 08/14/2018  . Personal history of pulmonary embolism 08/14/2018  . Red blood cell antibody positive, compatible PRBC difficult to obtain 08/14/2018  . Malignant neoplasm of overlapping sites of bladder ([place]) 07/20/2018  . Volume overload 06/18/2018  . Fluid overload 06/17/2018  . Elevated troponin 06/17/2018  . Hematuria 03/27/2018  . Hypokalemia 03/27/2018  . Thrombocytopenia ([place]) 03/27/2018  . Anemia in ESRD (end-stage renal disease) ([place]) 03/27/2018  . Bladder tumor 03/27/2018  . Encounter for adequacy testing for peritoneal dialysis ([place]) 01/03/2018  . 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 Compression of  vein 11/12/2017  . Hypertension with heart disease 10/14/2017  . Pyelocystitis 10/13/2017  . 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 Cramp and spasm 02/20/2017  . Acidosis 02/18/2017  . Dependence on renal dialysis (HCC) 02/18/2017  . Hypertensive heart and chronic kidney disease with heart failure and with stage 5 chronic kidney disease, or end stage renal disease (HCC) 02/18/2017  . Iron deficiency anemia, unspecified 02/18/2017  . Moderate protein-calorie malnutrition (HCC) 02/18/2017  . Other dietary vitamin B12 deficiency anemia 02/18/2017  . Other disorders of electrolyte and fluid balance, not elsewhere classified 02/18/2017  . Other disorders resulting from impaired renal tubular function 02/18/2017  . Personal history of nicotine dependence 02/18/2017  . Cardiomegaly 01/21/2017  . Congestive heart failure (HCC) 01/21/2017  . Pancytopenia (HCC) 06/14/2016  . Left arm pain 06/14/2016  . 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 Mechanical complication of other vascular device, implant, and graft 08/10/2013  . End stage renal disease (HCC) 08/10/2013  . ESRD (end stage renal  disease) (HCC) 06/07/2013  . Nausea and vomiting 06/07/2013  . SOB (shortness of breath) 05/10/2013  . Chest discomfort 11/27/2012  . Shortness of breath 11/27/2012  . Chronic hepatitis C without hepatic coma (HCC) 02/04/2012  . Secondary hyperparathyroidism (of renal origin) 02/04/2012  . Anemia associated with chronic renal failure 02/04/2012  . CAD (coronary atherosclerotic disease) 02/04/2012  . Pulmonary embolism (HCC) 07/20/2010  . Chronic pain syndrome 06/08/2010  . Renal failure 04/26/2010  . Atherosclerosis of native coronary artery of native heart without angina pectoris 02/23/2009  . HEMORRHOIDS, INTERNAL 02/23/2009  . GERD 02/23/2009  . ESRD on dialysis (HCC) 02/16/2009  . Allergic rhinitis 05/06/2008   ESRD on HD HTN HLD CAD PVD 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 Winter), 3/16/2020Cystoprostatectomy + resection bladder augmentation + enterolysis([facility], Dr. Allen) PMH LIH with mesh (02/2019 Dr. Kinsinger)  thrombocytopenia PMH PE Hepatitis C   Acute on chronic anemia - transfuse as needed per primary. hgb down slightly  GI Bleed requiring transfusion - CTA abdomen pelvis 3/28 with no evidence of GIB - s/p colonoscopy 3/28 Dr. Gupta; two colon polyps removed, diverticulosis noted, no bleeding; required intubated for procedure due to flash pulm edema - initial intubation attempt was in the esophagus. Esophogram negative for esophageal injury/leak. - Dr. Gupta performed bedside endoscopy 3/30 showing Grade I esophageal varices, Small HH with healing Mallory-Weiss tear. No active bleeding. Gastritis present.   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 Eric Wilson S/p re-exploration, ex lap, washout, abdominal closure 3/31 Dr. Wilso - POD#4, afebrile - drain serosang - cleared by SLP for CLD, advance diet per speech, would not advance past FLD today.   - continue PPI, continue to monitor melena and CBC. -no antibiotics needed from abdominal standpoint -PT/OT consult for mobilization   FEN: CLD, pre-albumin 16.7 ID: Zosyn 3/29 >> 4/3 VTE: SCD's, chemical VTE held in the setting of resolving hemorrhagic shock  Foley: none, anuric at baseline Dispo: CLD if passes swallow with speech therapy, continue BID PPI    LOS: 9 days    Elizabeth Simaan, PA-C General & Trauma Surgery [phone] [facility]  

## 2020-08-29 NOTE — Progress Notes (Addendum)
Logan Martin Progress Note   Subjective:  Seen in room - feeling well today. No CP, dyspnea improved although remains on nasal O2. Still with BUE edema, none in lower extremities.  Background: 65yo man with ESRD, bladder cancer - p/w GIB and underwent colonoscopy on 3/28. On 3/29, developed acute abdominal pain -> imaging with acute splenic rupture and large hemoperitoneum. Underwent emergent splenectomy -> required 6U PRBCs, 6U FFP, 1U Plts. Extubated 4/2.   Objective Vitals:   08/29/20 0300 08/29/20 0400 08/29/20 0500 08/29/20 0755  BP: (!) 99/48 112/68 (!) 108/43   Pulse: 87 85 81   Resp:      Temp:   97.8 F (36.6 C) 97.9 F (36.6 C)  TempSrc:   Oral Oral  SpO2: (!) 85% 96% 93%   Weight:      Height:       Physical Exam General: Well appearing man, NAD. Nasal O2 in place Heart: RRR; no murmur Lungs: CTA in upper lobes, dull bases Abdomen: soft, non-tender Extremities: No LE edema, 2+ BUE edema (R>L) with small blister to R forearm. Dialysis Access: RUE AVF + thrill  Additional Objective Labs: Basic Metabolic Panel: Recent Labs  Lab 08/28/20 0404 08/28/20 1535 08/29/20 0120  NA 137 139 139  K 3.6 3.4* 3.7  CL 99 100 100  CO2 27 29 29   GLUCOSE 114* 125* 82  BUN 55* 20 37*  CREATININE 8.15* 3.33* 6.32*  CALCIUM 9.0 8.8* 8.7*  PHOS 3.7 1.6* 3.5   Liver Function Tests: Recent Labs  Lab 08/26/20 0432 08/26/20 1902 08/27/20 0423 08/28/20 0404 08/28/20 1535 08/29/20 0120  AST 79*  --  58*  --   --  39  ALT 82*  --  48*  --   --  25  ALKPHOS 56  --  53  --   --  67  BILITOT 0.7  --  0.6  --   --  0.6  PROT 5.0*  --  4.9*  --   --  5.0*  ALBUMIN 2.3*   < > 2.1* 2.2* 2.3* 2.0*   < > = values in this interval not displayed.   Recent Labs  Lab 08/23/20 1331  LIPASE 26   CBC: Recent Labs  Lab 08/25/20 1351 08/26/20 0432 08/27/20 0423 08/28/20 1029 08/29/20 0120  WBC 16.0* 13.1* 10.4 11.8* 10.9*  NEUTROABS 12.6* 10.7*  --   --  8.4*   HGB 8.4* 8.1* 7.4* 8.7* 7.6*  HCT 24.6* 24.0* 22.2* 26.8* 24.0*  MCV 90.1 92.3 92.1 95.0 96.4  PLT 139* 155 163 244 233   Studies/Results: DG Abd Portable 1V  Result Date: 08/27/2020 CLINICAL DATA:  Nausea. EXAM: PORTABLE ABDOMEN - 1 VIEW COMPARISON:  08/24/2020 FINDINGS: Bowel gas pattern is nonobstructive with contrast throughout the colon. Surgical drain is present over the left upper quadrant. No free peritoneal air. Remainder of the exam is unchanged. IMPRESSION: Nonobstructive bowel gas pattern. Electronically Signed   By: Marin Olp M.D.   On: 08/27/2020 14:41   Medications: . sodium chloride    . sodium chloride    . sodium chloride Stopped (08/25/20 2128)  . sodium chloride    . sodium chloride     . sodium chloride   Intravenous Once  . sodium chloride   Intravenous Once  . sodium chloride   Intravenous Once  . amLODipine  10 mg Oral Daily  . calcitRIOL  0.25 mcg Oral Daily  . chlorhexidine gluconate (MEDLINE KIT)  15  mL Mouth Rinse BID  . Chlorhexidine Gluconate Cloth  6 each Topical Q0600  . Chlorhexidine Gluconate Cloth  6 each Topical Q0600  . feeding supplement  1 Container Oral TID BM  . ferric citrate  420 mg Oral TID WC  . hydrALAZINE  25 mg Oral TID  . insulin aspart  0-9 Units Subcutaneous Q6H  . labetalol  100 mg Oral BID  . mouth rinse  15 mL Mouth Rinse 10 times per day  . pantoprazole  40 mg Intravenous Q12H  . sodium chloride flush  10-40 mL Intracatheter Q12H  . traZODone  150 mg Oral QHS   Dialysis Orders: Home hemodialysis ([facility]) MTTF, EDW 46.5kg - Aranesp last given 3/26.  Assessment/Plan: 1. Splenic rupture: S/p emergent splenectomy 3/29, then re-exploration/washout/closure 3/31. Surgery following.  2. Hemorrhagic shock/GI Bleed: s/p colonoscopy 3/28 - 2 polyps removed. Esophogram negative for esophageal injury/leak. Bedside 3/30 with grade I esophageal varices, small HH with healing mallory-weiss tear. No active bleeding.  3. ESRD:  Previously on home hemo -> following MWF schedule during admit. 4. HTN/volume: With edematous BUE - attempted extra HD 4/3 but stopped d/t hypotension. BP now low - will d/c amlodipine. Remains on labetalol and hydralazine. The presence of only UE edema is slightly concerning for SVC syndrome, could he have compression from abdominal bleed? Per notes, he has stents in R subclavian and L innominate veins - wonder if they are occluded? Will d/w Dr. Jonnie Finner. 5. Anemia of ESRD + hemorrhage (above): s/p multiple transfusions. Due for Aranesp - will give 291mg today. 6. Secondary HPTH: Phos low side, will d/c Auryxia (can cause dark stools which can confound Hx GIB). CorrCa ok. Holding sensipar, remains on calcitriol daily. 7. Nutrition: Alb very low - will add protein supplements.    KVeneta Penton PA-C 08/29/2020, 10:26 AM  [employer]

## 2020-08-29 NOTE — Progress Notes (Signed)
Nutrition Follow-up  DOCUMENTATION CODES:   Non-severe (moderate) malnutrition in context of chronic illness  INTERVENTION:   - Boost Breeze po TID, each supplement provides 250 kcal and 9 grams of protein  - ProSource Plus 30 ml po BID, each supplement provides 100 kcal and 15 grams of protein  - RD will monitor for diet advancement and adjust oral nutrition supplements as appropriate  NUTRITION DIAGNOSIS:   Moderate Malnutrition related to chronic illness (ESRD on home HD, CHF) as evidenced by moderate fat depletion,severe muscle depletion.  Ongoing, being addressed via supplements  GOAL:   Patient will meet greater than or equal to 90% of their needs  Progressing  MONITOR:   PO intake,Supplement acceptance,Diet advancement,Labs,Weight trends,Skin,I & O's  REASON FOR ASSESSMENT:   Ventilator    ASSESSMENT:   65 year old male who presented to the ED on 3/25 with chest pain and SOB. PMH of ESRD on home HD, CAD, CHF, PVD, adenocarcinoma of the bladder s/p TURBT, anemia, thrombocytopenia, PE, hepatitis C.  3/28 - colonoscopy (findings of polyp and diverticulosis, no active bleeding), intubated during procedure due to flash pulmonary edema, later extubated 3/29 - hemorrhagic shock secondary to splenic rupture, transferred to ICU, intubated, s/p ex-lap with splenectomy and application of abdominal wound VAC 3/30 - EGD revealing esophageal varices, small hiatal hernia, healing Mallory-Weiss tear, gastritis 3/31 - s/p ex-lap with washout and closure 4/01 - trickle feeds started at 10 ml/hr 4/02 - extubated 4/03 - diet advanced to clear liquids, new bag of TPN not hung due to pt's refusal  Discussed pt with RN and during ICU rounds.  TPN has been off since 1800 yesterday due to pt's refusal. Pt under the impression that TPN was contributing to his frequent stools. Pt now on a clear liquid diet and has been this tolerating well. Boost Breeze and ProSource ordered to aid pt in  meeting kcal and protein needs.  Spoke with pt and daughter at bedside. Pt requesting Ensure. RD explained clear liquid diet order and current supplements ordered. RD will follow for diet advancement and ability to order additional supplements. Explained importance of adequate PO intake in promoting healing. Pt expresses understanding. Pt able to drink some coffee and broth this morning from breakfast meal tray.  Last HD was yesterday with 1129 ml net UF. Post-HD weight was 58 kg which is above EDW of 46.5 kg. Per notes, planning for HD again today.  Nephrology d/c Lorin Picket as it can cause dark stools.  EDW: 46.5 kg Admit weight: 49.1 kg Current weight: 58 kg  Medications reviewed and include: calcitriol, aranesp, Boost Breeze TID, SSI, IV protonix  Labs reviewed: hemoglobin 7.6 CBG's: 81-123 x 24 hours  JP drain: 40 ml x 24 hours Stool: 400 ml + 3 unmeasured outputs x 24 hours I/O's: +14.1 L since admit  Diet Order:   Diet Order            Diet clear liquid Room service appropriate? Yes with Assist; Fluid consistency: Thin  Diet effective now                 EDUCATION NEEDS:   Education needs have been addressed  Skin:  Skin Assessment: Skin Integrity Issues: Stage II: right buttock Incisions: abdomen  Last BM:  400 ml + 3 unmeasured outputs x 24 hours via rectal tube  Height:   Ht Readings from Last 1 Encounters:  08/20/20 5\' 4"  (1.626 m)    Weight:   Wt Readings from Last 1 Encounters:  08/28/20 58 kg    BMI:  Body mass index is 21.95 kg/m.  Estimated Nutritional Needs:   Kcal:  1700-1900  Protein:  85-100 grams  Fluid:  1000 ml + UOP    Gustavus Bryant, MS, RD, LDN Inpatient Clinical Dietitian Please see AMiON for contact information.

## 2020-08-29 NOTE — Progress Notes (Signed)
Patient requested LR bolus be stopped. LR 277 mls instilled in patient. Will monitor.

## 2020-08-29 NOTE — Progress Notes (Signed)
PHARMACY - TOTAL PARENTERAL NUTRITION CONSULT NOTE  Indication:  Intolerance to EN  Patient Measurements: Height: 5\' 4"  (162.6 cm) Weight: 58 kg (127 lb 13.9 oz) IBW/kg (Calculated) : 59.2 TPN AdjBW (KG): 49.1 Body mass index is 21.95 kg/m.  Weight 45.5 kg on admit, now 51.5 kg d/t volume  Assessment:  64 YOM presented on 08/19/20 with chest pain and SOB. Had hematochezia and CTA was negative for overt bleeding. Colonoscopy on 3/28 showed diverticulosis and polyps that were resected. Patient deteriorated on 3/29, requiring intubation and emergent ex-lap with splenectomy and wound VAC application for splenic rupture. S/p massive transfusion protocol for shock/significant bleeding related his splenic rupture. Abdomen left open. EGD on 3/30 revealed esophageal varices with small hiatal hernia and healing Mallory-Weiss tear. Patient s/p OR for abdominal closure 3/31. Pharmacy consulted for TPN management. Patient has moderate PCM.  Glucose / Insulin: no hx DM - CBGs controlled, running low in 80-90s today. No SSI usage Electrolytes: all stable WNL except  Renal: ESRD on MWF HD - additional HD 4/3, and planning again 4/4 with patient appearing edematous per Renal Hepatic: LFTs, Tbili WNL. TG 47>135. Albumin 2.0, prealbumin 16.4 stable Intake / Output; MIVF: Frequent, dark stools with rectal tube in place. Drain output 56ml/24hrs. Net +14L this admit GI Imaging: 4/2: Abd X-ray: nonobstructive bowel gas pattern GI Surgeries / Procedures: 3/31 OR - abd washout and closure 4/2 extubated  Central access: PICC placed 08/20/20, CVC single lumen placed 08/23/20 TPN start date: 08/25/20  Nutritional Goals (per RD rec on 4/1): 1500 kCal and 80-95gm AA per day  Goal (concentrated) TPN rate is 55 ml/hr (will provide 85g AA and 1507 kCal, meeting 100% of patient needs)  Current Nutrition:  TPN taken down 4/3 at ~1800 due to patient refusal (thinks TPN is reason for his frequent stools, insisting it be  taken down and stopped) CLD started 4/3 - tolerating. Would not advance past FLD today per Surgery Boost TID ordered to start 4/4  Plan:  Per discussion with Surgery PA (Simaan), ok to stop TPN for now with patient refusal and will reassess based on PO intake on 4/5. Patient has now been off TPN ~16 hrs and is tolerating CLD, although Surgery unsure how much he will be able to take in and if PO nutrition will be sufficient. Spoke with patient/family member who express understanding of the nutrition plan. Change SSI to qAC/HS and adjust as needed Renal function panel BID per MD F/u SLP evaluation and further diet progression   Arturo Morton, PharmD, BCPS Please check AMION for all [place] contact numbers Clinical Pharmacist 08/29/2020 9:03 AM

## 2020-08-29 NOTE — Progress Notes (Signed)
Physical Therapy Treatment Patient Details Name: Logan Martin MRN: [id number] DOB: 1955-10-08 Today's Date: 08/29/2020    History of Present Illness 65 y.o. male, with PMH of ESRD, HTN, HLD, CAD, history of adenocarcinoma of the bladder, anemia, thrombocytopenia, hx of PE, hepatitis C who presented to the ER on 08/19/2020 with general malaise, tachycardia. +melena, colonoscopy 3/28 required intubation/extubation and then pt became hypotensive and reintubated; significant hemoperitoneum with splenic rupture and underwent exploratory laparotomy with splenectomy; 3/31 re-exp lap with closure; 4/2 extubated    PT Comments    Pt slowly improving toward goals.  Emphasis on transitions, sit to stand/transfers, progressing gait stability and stamina.  Pt's grandson is pt's primary caregiver post discharge, but I related to him that he may need back up to allow for up to 24/7 assist/supervision initially at home.    Follow Up Recommendations  Home health PT;Supervision for mobility/OOB (grandson unable to be there 24/7)     Equipment Recommendations   (TBA)    Recommendations for Other Services       Precautions / Restrictions Precautions Precautions: Fall Restrictions Weight Bearing Restrictions: No    Mobility  Bed Mobility Overal bed mobility: Needs Assistance Bed Mobility: Supine to Sit;Sit to Supine     Supine to sit: Min assist     General bed mobility comments: truncal assist forward/stability assist    Transfers Overall transfer level: Needs assistance Equipment used: None Transfers: Sit to/from [employer] Transfers Sit to Stand: Mod assist;Min assist Stand pivot transfers: Mod assist       General transfer comment: face to face assist up and stability assist during pivot to [facility].  Min assist with armrest up from [facility]  Ambulation/Gait Ambulation/Gait assistance: Mod assist;+2 safety/equipment Gait Distance (Feet): 60 Feet Assistive device: Rolling walker (2  wheeled) Gait Pattern/deviations: Step-through pattern Gait velocity: slower Gait velocity interpretation: <1.8 ft/sec, indicate of risk for recurrent falls General Gait Details: short mildly unsteady, wide BOS steps with moderately heavy use of the RW.  progressing assist from light to heavier mod assist as pt fatigued.   Stairs             Wheelchair Mobility    Modified Rankin (Stroke Patients Only)       Balance Overall balance assessment: Needs assistance Sitting-balance support: No upper extremity supported;Feet supported Sitting balance-Leahy Scale: Fair     Standing balance support: Bilateral upper extremity supported Standing balance-Leahy Scale: Poor                              Cognition Arousal/Alertness: Awake/alert Behavior During Therapy: Flat affect;WFL for tasks assessed/performed Overall Cognitive Status: Within Functional Limits for tasks assessed                                        Exercises      General Comments General comments (skin integrity, edema, etc.): SpO2 during gait on RA dropped into the 88/89% range.  Pt had O2 off on arrival, sats in low 90's.  On return, O2 reapplied and SpO2 rose again to 90/91%      Pertinent Vitals/Pain Pain Assessment: Faces Faces Pain Scale: No hurt Pain Location: abdomen Pain Descriptors / Indicators: Discomfort Pain Intervention(s): Monitored during session    Home Living  Prior Function            PT Goals (current goals can now be found in the care plan section) Acute Rehab PT Goals Patient Stated Goal: return home PT Goal Formulation: With patient Time For Goal Achievement: 08/28/20 Potential to Achieve Goals: Fair Progress towards PT goals: Progressing toward goals    Frequency    Min 3X/week      PT Plan Current plan remains appropriate    Co-evaluation              AM-PAC PT "6 Clicks" Mobility   Outcome  Measure  Help needed turning from your back to your side while in a flat bed without using bedrails?: A Little Help needed moving from lying on your back to sitting on the side of a flat bed without using bedrails?: A Little Help needed moving to and from a bed to a chair (including a wheelchair)?: A Lot Help needed standing up from a chair using your arms (e.g., wheelchair or bedside chair)?: A Lot Help needed to walk in hospital room?: A Lot Help needed climbing 3-5 steps with a railing? : Total 6 Click Score: 13    End of Session Equipment Utilized During Treatment: Oxygen Activity Tolerance: Patient limited by fatigue Patient left: in chair;with call bell/phone within reach;with chair alarm set;with nursing/sitter in room Nurse Communication: Mobility status PT Visit Diagnosis: Unsteadiness on feet (R26.81);Muscle weakness (generalized) (M62.81)     Time: 8299-3716 PT Time Calculation (min) (ACUTE ONLY): 31 min  Charges:  $Gait Training: 8-22 mins $Therapeutic Activity: 8-22 mins                     08/29/2020  Logan Martin., PT Acute Rehabilitation Services [phone]  (pager) [phone]  (office)   Logan Martin 08/29/2020, 3:58 PM

## 2020-08-29 NOTE — Progress Notes (Signed)
eLink Physician-Brief Progress Note Patient Name: Logan Martin DOB: Jan 23, 1956 MRN: [id number]   Date of Service  08/29/2020  HPI/Events of Note  Patient underwent HD earlier today. Only took off 1.1 L UF due to drop in BP. Now, patient with SBPs in the 70s-90s. Baseline SBP is >\= 110 mmHg. Afebrile, no significant leukocytosis.   eICU Interventions  Patient may be somewhat hypovolemic which would explain the difficulty taking off UF in HD earlier, as well as his current BP.  Ordered LR 500cc bolus. Will assess response to that.     Intervention Category Major Interventions: Hypotension - evaluation and management  Saul Fabiano Valta Dillon 08/29/2020, 2:54 AM

## 2020-08-30 DIAGNOSIS — N186 End stage renal disease: Secondary | ICD-10-CM | POA: Diagnosis not present

## 2020-08-30 DIAGNOSIS — J9601 Acute respiratory failure with hypoxia: Secondary | ICD-10-CM | POA: Diagnosis not present

## 2020-08-30 LAB — RENAL FUNCTION PANEL
Albumin: 2.3 g/dL — ABNORMAL LOW (ref 3.5–5.0)
Albumin: 2.9 g/dL — ABNORMAL LOW (ref 3.5–5.0)
Anion gap: 10 (ref 5–15)
Anion gap: 9 (ref 5–15)
BUN: 48 mg/dL — ABNORMAL HIGH (ref 8–23)
BUN: 16 mg/dL (ref 8–23)
CO2: 29 mmol/L (ref 22–32)
CO2: 29 mmol/L (ref 22–32)
Calcium: 9.1 mg/dL (ref 8.9–10.3)
Calcium: 9.2 mg/dL (ref 8.9–10.3)
Chloride: 99 mmol/L (ref 98–111)
Chloride: 99 mmol/L (ref 98–111)
Creatinine, Ser: 8.14 mg/dL — ABNORMAL HIGH (ref 0.61–1.24)
Creatinine, Ser: 4.05 mg/dL — ABNORMAL HIGH (ref 0.61–1.24)
GFR, Estimated: 7 mL/min — ABNORMAL LOW (ref >60)
GFR, Estimated: 16 mL/min — ABNORMAL LOW (ref >60)
Glucose, Bld: 79 mg/dL (ref 70–99)
Glucose, Bld: 75 mg/dL (ref 70–99)
Phosphorus: 4.6 mg/dL (ref 2.5–4.6)
Phosphorus: 2.3 mg/dL — ABNORMAL LOW (ref 2.5–4.6)
Potassium: 3.8 mmol/L (ref 3.5–5.1)
Potassium: 3.4 mmol/L — ABNORMAL LOW (ref 3.5–5.1)
Sodium: 138 mmol/L (ref 135–145)
Sodium: 137 mmol/L (ref 135–145)

## 2020-08-30 LAB — GLUCOSE, CAPILLARY
Glucose-Capillary: 83 mg/dL (ref 70–99)
Glucose-Capillary: 86 mg/dL (ref 70–99)
Glucose-Capillary: 80 mg/dL (ref 70–99)
Glucose-Capillary: 68 mg/dL — ABNORMAL LOW (ref 70–99)

## 2020-08-30 LAB — CBC
HCT: 28.6 % — ABNORMAL LOW (ref 39.0–52.0)
Hemoglobin: 9.3 g/dL — ABNORMAL LOW (ref 13.0–17.0)
MCH: 30.8 pg (ref 26.0–34.0)
MCHC: 32.5 g/dL (ref 30.0–36.0)
MCV: 94.7 fL (ref 80.0–100.0)
Platelets: 302 10*3/uL (ref 150–400)
RBC: 3.02 MIL/uL — ABNORMAL LOW (ref 4.22–5.81)
RDW: 17.8 % — ABNORMAL HIGH (ref 11.5–15.5)
WBC: 10.5 10*3/uL (ref 4.0–10.5)
nRBC: 1.5 % — ABNORMAL HIGH (ref 0.0–0.2)

## 2020-08-30 LAB — MAGNESIUM: Magnesium: 2.2 mg/dL (ref 1.7–2.4)

## 2020-08-30 MED ORDER — ALBUMIN HUMAN 25 % IV SOLN: 25.0000 g | Freq: Once | INTRAVENOUS | Status: AC

## 2020-08-30 MED ORDER — NEPRO/CARBSTEADY PO LIQD
237.0000 mL | Freq: Three times a day (TID) | ORAL | Status: DC
  Administered 2020-08-31: 237 mL via ORAL

## 2020-08-30 MED ORDER — SIMETHICONE 80 MG PO CHEW
80.0000 mg | CHEWABLE_TABLET | Freq: Four times a day (QID) | ORAL | Status: DC | PRN
  Administered 2020-08-30: 80 mg via ORAL
  Filled 2020-08-30: qty 1

## 2020-08-30 MED ORDER — OXYCODONE HCL 5 MG PO TABS
10.0000 mg | ORAL_TABLET | ORAL | Status: DC | PRN
  Administered 2020-08-30 – 2020-08-31 (×2): 10 mg via ORAL
  Filled 2020-08-30 (×2): qty 2

## 2020-08-30 MED ORDER — OXYCODONE-ACETAMINOPHEN 5-325 MG PO TABS
1.0000 | ORAL_TABLET | Freq: Two times a day (BID) | ORAL | Status: DC | PRN
  Administered 2020-08-30: 1 via ORAL
  Filled 2020-08-30: qty 1

## 2020-08-30 MED ORDER — ALBUMIN HUMAN 25 % IV SOLN
INTRAVENOUS | Status: AC
  Administered 2020-08-30: 25 g via INTRAVENOUS
  Filled 2020-08-30: qty 100

## 2020-08-30 MED ORDER — DARBEPOETIN ALFA 200 MCG/0.4ML IJ SOSY: 200.0000 ug | PREFILLED_SYRINGE | INTRAMUSCULAR | Status: DC

## 2020-08-30 MED ORDER — DARBEPOETIN ALFA 200 MCG/0.4ML IJ SOSY
PREFILLED_SYRINGE | INTRAMUSCULAR | Status: AC
  Administered 2020-08-30: 200 ug via INTRAVENOUS
  Filled 2020-08-30: qty 0.4

## 2020-08-30 MED ORDER — MIDODRINE HCL 5 MG PO TABS
5.0000 mg | ORAL_TABLET | ORAL | Status: DC
  Administered 2020-08-30 – 2020-08-31 (×3): 5 mg via ORAL
  Filled 2020-08-30 (×3): qty 1

## 2020-08-30 NOTE — Progress Notes (Signed)
NAME:  Logan Martin, MRN:  [id number], DOB:  [birth year], LOS: 84 ADMISSION DATE:  08/19/2020, CONSULTATION DATE:  08/23/2020 REFERRING MD:  Dr. Waldron Labs, CHIEF COMPLAINT:  Acute on Chronic Anemia   History of Present Illness:  65 year old male with prior history of ESRD, HTN, HLD,CAD, history of adenocarcinoma of the bladder,anemia, thrombocytopenia, hx of PE, hepatitis C presenting on 3/25 with progressive fatigue and generalized weakness with dark stools for 1 week. Work-up significant for anemia and melanotic stools. CTA abdomen pelvis showed no active source of GI bleeding. It did note focal areas of high-grade stenosis within distal branches of the SMA in the right lower quadrant. Due to poor IV access, a central line was placed by IR. Nephrology and [facility] consulted. Underwent colonoscopy on 3/28 which showed diverticulosis, 2 polyps were removed and sent for biopsy but showed no evidence of GI bleed. Patient has required ongoing transfusion throughout hospitalization for ongoing anemia of unclear source. On 3/29 patient acutely complained of worsening abdominal and chronic back pain, found to have splenic rupture, required massive transfusion and emergent OR  Pertinent  Medical History  ESRD (on home HD MTTF), HTN, HLD,CAD, history of adenocarcinoma of the bladder,anemia, thrombocytopenia, hx of PE, hepatitis C Hard of hearing   Significant Hospital Events: Including procedures, antibiotic start and stop dates in addition to other pertinent events    3/25 admitted to [facility], nephrology following   3/26 L IJ DL CVL placed by IR (of note, unable to pass via right IJ due to occlusion and lower right IJ at venous stent)  3/27 [facility] consulted  3/28 colonoscopy by Dr. Lyndel Safe with two polyps removed, required intubation during procedure for flash pulmonary edema; extubated post procedure  3/29 transfused 4th unit PRBC this am. During afternoon, acute decompensation/  hypotension on floor ->RRT, vasopresors started, stat CT abd/ pelvis and surgical consult ->intubated, then to OR with ruptured spleen s/p MTP and splenectomy  3/30 EGD 3/30 - healing Mallory-Weiss tear, grade 1 esophageal varices, small hiatal hernia  3/31 OR >> washout & abd closure  4/1off vasopressin and Levophed , hemodialysis, started tube feeds  4/3 patient requested discontinuation of tube feeds, started on CLD  3/26 L IJ CVL>> 3/29 ETT >>4/2 3/29 R femoral Aline >>4/2 3/29 L femoral cordis >> 4/1  Interim History / Subjective:  Feels well this morning. Was able to get up and walk with PT yesterday. Is hopeful that he'll be able to leave hospital by Friday to attend a family event.   Objective   Blood pressure (!) 114/53, pulse 92, temperature 97.6 F (36.4 C), temperature source Oral, resp. rate 18, height 5\' 4"  (1.626 m), weight 58 kg, SpO2 (!) 86 %.        Intake/Output Summary (Last 24 hours) at 08/30/2020 0821 Last data filed at 08/30/2020 0400 Gross per 24 hour  Intake 655 ml  Output 30 ml  Net 625 ml   Filed Weights   08/28/20 0400 08/28/20 1245 08/28/20 1550  Weight: 56.1 kg 59.1 kg 58 kg    Physical Exam Vitals reviewed.  Constitutional:      General: He is not in acute distress. Cardiovascular:     Rate and Rhythm: Normal rate and regular rhythm.     Heart sounds: Normal heart sounds. No friction rub.  Pulmonary:     Comments: Normal WOB on 2LNC Good breath sounds throughout Abdominal:     Comments: Abdominal surgical wounds covered in dressing, no apparent drainage  or erythema surrounding   Musculoskeletal:     Right lower leg: No edema.     Left lower leg: No edema.  Skin:    General: Skin is warm and dry.  Neurological:     General: No focal deficit present.     Mental Status: He is alert.      Labs/imaging that I havepersonally reviewed  (right click and "Reselect all SmartList Selections" daily)  am CBC pending Lab Results   Component Value Date   CREATININE 8.14 (H) 08/30/2020   BUN 48 (H) 08/30/2020   NA 138 08/30/2020   K 3.8 08/30/2020   CL 99 08/30/2020   CO2 29 08/30/2020     Resolved Hospital Problem list     Assessment & Plan:  Acute on Chronic Anemia Hgb 7.6>9.1 s/p transfusion yesterday. Feels well this morning. Had a non-melanotic stool this am. Jama Flavors with PT yesterday - F/u am CBC - Transfuse for Hgb<7 - Physical therapy - Stable for transfer to floor   Shock- resolved Chronic hypertension Transient hypotension with dialysis.  BPs high during the day, becoming hypotensive with dialysis which he reports to be a longstanding issue. Dialysis cut short 4/3 due to hypotension.Per chart review it seems that he has had normal QTc in the past, suggesting that this is not congenital long QT and midodrine should be a safe option for him.  - Nephro following  - Add midodrine on dialysis days   Splenic rupture 3/29 S/p ex-lap w/ splenectomy 3/29 and washout + abdominal closure 4/1. Drain in place. Surgery following. Glucose wnl.  - Per speech therapy recs, will advance diet to Dysphagia 3  ESRD on HD On home dialysis, nephro following in hospital. Was not dialyzed yesterday. - Plan for dialysis today  - Midodrine on dialysis days as above   ICU Delirium- resolved No issues in past 24 hours. - Continue trazodone - Reorientation as needed   Best practice (right click and "Reselect all SmartList Selections" daily)  Diet:  Oral Pain/Anxiety/Delirium protocol (if indicated): on home oxycodone VAP protocol (if indicated): Not indicated DVT prophylaxis: Contraindicated GI prophylaxis: PPI Glucose control:  SSI Yes Central venous access:  Yes, and it is still needed Arterial line:  N/A Foley:  N/A Mobility:  OOB  PT consulted: Yes Last date of multidisciplinary goals of care discussion [4/1] Code Status:  full code Disposition: Floor  Labs   CBC: Recent Labs  Lab 08/23/20 1331  08/23/20 1454 08/25/20 1351 08/26/20 0432 08/27/20 0423 08/28/20 1029 08/29/20 0120 08/29/20 2030  WBC 4.5   < > 16.0* 13.1* 10.4 11.8* 10.9*  --   NEUTROABS 3.1  --  12.6* 10.7*  --   --  8.4*  --   HGB 4.6*   < > 8.4* 8.1* 7.4* 8.7* 7.6* 9.1*  HCT 14.1*   < > 24.6* 24.0* 22.2* 26.8* 24.0* 27.7*  MCV 95.3   < > 90.1 92.3 92.1 95.0 96.4  --   PLT 103*   < > 139* 155 163 244 233  --    < > = values in this interval not displayed.    Basic Metabolic Panel: Recent Labs  Lab 08/26/20 0432 08/26/20 1902 08/27/20 0423 08/27/20 1434 08/28/20 0404 08/28/20 1535 08/29/20 0120 08/29/20 1900 08/30/20 0413  NA 131*   < > 135   < > 137 139 139 137 138  K 4.0   < > 2.9*   < > 3.6 3.4* 3.7 3.7 3.8  CL  98   < > 100   < > 99 100 100 97* 99  CO2 20*   < > 24   < > 27 29 29 27 29   GLUCOSE 105*   < > 117*   < > 114* 125* 82 87 79  BUN 54*   < > 35*   < > 55* 20 37* 46* 48*  CREATININE 9.89*   < > 6.58*   < > 8.15* 3.33* 6.32* 7.53* 8.14*  CALCIUM 8.2*   < > 8.2*   < > 9.0 8.8* 8.7* 9.1 9.1  MG 2.0  --  2.0  --  2.3  --  2.2  --  2.2  PHOS 5.0*   < > 2.9  --  3.7 1.6* 3.5 4.4 4.6   < > = values in this interval not displayed.   GFR: Estimated Creatinine Clearance: 7.5 mL/min (A) (by C-G formula based on SCr of 8.14 mg/dL (H)). Recent Labs  Lab 08/23/20 1400 08/23/20 1809 08/23/20 2040 08/24/20 0323 08/25/20 0431 08/26/20 0432 08/27/20 0423 08/28/20 1029 08/29/20 0120  WBC  --  9.2   < > 9.6   < > 13.1* 10.4 11.8* 10.9*  LATICACIDVEN 6.3* 6.3*  --  1.8  --   --   --   --   --    < > = values in this interval not displayed.    Liver Function Tests: Recent Labs  Lab 08/24/20 0323 08/25/20 0431 08/25/20 1351 08/26/20 0432 08/26/20 1902 08/27/20 0423 08/28/20 0404 08/28/20 1535 08/29/20 0120 08/29/20 1900 08/30/20 0413  AST 456* 136*  --  79*  --  58*  --   --  39  --   --   ALT 332* 160*  --  82*  --  48*  --   --  25  --   --   ALKPHOS 47 53  --  56  --  53  --   --   67  --   --   BILITOT 1.0 0.6  --  0.7  --  0.6  --   --  0.6  --   --   PROT 4.8* 4.8*  --  5.0*  --  4.9*  --   --  5.0*  --   --   ALBUMIN 2.6* 2.4*   < > 2.3*   < > 2.1* 2.2* 2.3* 2.0* 2.3* 2.3*   < > = values in this interval not displayed.   Recent Labs  Lab 08/23/20 1331  LIPASE 26   No results for input(s): AMMONIA in the last 168 hours.  ABG    Component Value Date/Time   PHART 7.486 (H) 08/24/2020 1010   PCO2ART 34.3 08/24/2020 1010   PO2ART 171 (H) 08/24/2020 1010   HCO3 25.9 08/24/2020 1010   TCO2 27 08/24/2020 1010   ACIDBASEDEF 5.0 (H) 08/23/2020 1750   O2SAT 100.0 08/24/2020 1010     Coagulation Profile: Recent Labs  Lab 08/23/20 1454 08/23/20 2035 08/23/20 2040 08/25/20 0431  INR 1.5* 1.4* 1.4* 1.3*    Cardiac Enzymes: No results for input(s): CKTOTAL, CKMB, CKMBINDEX, TROPONINI in the last 168 hours.  HbA1C: Hgb A1c MFr Bld  Date/Time Value Ref Range Status  08/29/2020 01:20 AM 5.5 4.8 - 5.6 % Final    Comment:    (NOTE) Pre diabetes:          5.7%-6.4%  Diabetes:              >  6.4%  Glycemic control for   <7.0% adults with diabetes     CBG: Recent Labs  Lab 08/29/20 0754 08/29/20 1107 08/29/20 1551 08/29/20 2129 08/30/20 0815  GLUCAP 92 87 85 73 83    Review of Systems:   Review of Systems  Constitutional: Negative for fever.  Gastrointestinal: Negative for abdominal pain, blood in stool, melena and vomiting.  Neurological: Negative for sensory change.  Psychiatric/Behavioral: The patient does not have insomnia.   All other systems reviewed and are negative.    Past Medical History:  He,  has a past medical history of Alcohol abuse, Anemia, Cancer ([place]), Chronic low back pain, Cocaine abuse in remission ([place]), ESRD (end stage renal disease) ([place]), ESRD on dialysis (02/16/2009), Hearing aid worn, Hepatitis C, History of blood transfusion (2007 X 1), History of kidney stones, constipation, Hypertension, Left inguinal hernia,  Peripheral vascular disease ([place]), Pulmonary embolism [facility]) (Feb 2012), and Wears glasses.   Surgical History:   Past Surgical History:  Procedure Laterality Date  . A/V FISTULAGRAM N/A 11/22/2017   Procedure: A/V Fistulagram;  Surgeon: Serafina Mitchell, MD;  Location: [place] [facility];  Service: Cardiovascular;  Laterality: N/A;  rt. arm   . ABDOMINAL HERNIA REPAIR  2000    x 2  . APPLICATION OF WOUND VAC N/A 08/23/2020   Procedure: APPLICATION OF ABDOMINAL WOUND VAC;  Surgeon: Greer Pickerel, MD;  Location: [place];  Service: General;  Laterality: N/A;  . AV FISTULA PLACEMENT Right 2007   upper arm  . AV FISTULA REPAIR Right ~ 03/2016 & 04/2016  . BLADDER AUGMENTATION  1975  . COLONOSCOPY    . COLONOSCOPY WITH PROPOFOL N/A 08/22/2020   Procedure: COLONOSCOPY WITH PROPOFOL;  Surgeon: Jackquline Denmark, MD;  [facility];  Service: Endoscopy;  Laterality: N/A;  . CORONARY ANGIOPLASTY WITH STENT PLACEMENT  1999  . CYSTOSCOPY W/ URETERAL STENT PLACEMENT N/A 10/13/2017   Procedure: CYSTOSCOPY WITH IRRIGATION OF BLADDER PLACEMENT OF FOLEY;  Surgeon: Ceasar Mons, MD;  Location: [place];  Service: Urology;  Laterality: N/A;  . CYSTOSCOPY WITH FULGERATION N/A 03/27/2018   Procedure: Dante AND MUCOUS EVACUATION, BLADDER BIOPSY;  Surgeon: Franchot Gallo, MD;  Location: [facility];  Service: Urology;  Laterality: N/A;  . ESOPHAGOGASTRODUODENOSCOPY    . ESOPHAGOGASTRODUODENOSCOPY N/A 08/24/2020   Procedure: ESOPHAGOGASTRODUODENOSCOPY (EGD);  Surgeon: Jackquline Denmark, MD;  Location: [facility] ENDOSCOPY;  Service: Endoscopy;  Laterality: N/A;  . EXTRACORPOREAL SHOCK WAVE LITHOTRIPSY    . FISTULA SUPERFICIALIZATION Right 01/02/4165   Procedure: PLICATION OF ANEURYSM OF ARTERIOVENOUS FISTULA;  Surgeon: Angelia Mould, MD;  Location: [place];  Service: Vascular;  Laterality: Right;  . HERNIA REPAIR    . INGUINAL HERNIA REPAIR Right 1975  . INGUINAL HERNIA REPAIR Left  03/04/2019   Procedure: LEFT OPEN INGUINAL HERNIA REPAIR WITH MESH;  Surgeon: Kinsinger, Arta Bruce, MD;  Location: [place];  Service: General;  Laterality: Left;  GENERAL AND LMA  . INSERTION OF DIALYSIS CATHETER Left 03/15/2020   Procedure: INSERTION OF Left Internal Jugular DIALYSIS CATHETER;  Surgeon: [name], MD;  Location: [place];  Service: Vascular;  Laterality: Left;  . IR FLUORO GUIDE CV LINE LEFT  03/11/2020  . IR FLUORO GUIDE CV LINE LEFT  08/20/2020  . IR US GUIDE VASC ACCESS LEFT  03/11/2020  . IR US GUIDE VASC ACCESS LEFT  08/20/2020  . IR US GUIDE VASC ACCESS RIGHT  08/20/2020  . IR VENO/JUGULAR RIGHT  08/20/2020  .  LAPAROTOMY N/A 08/23/2020   Procedure: EXPLORATORY LAPAROTOMY;  Surgeon: Greer Pickerel, MD;  Location: [place];  Service: General;  Laterality: N/A;  . LAPAROTOMY N/A 08/25/2020   Procedure: RE EXPLORE EXPLORATORY LAPAROTOMY, WASHOUT AND CLOSURE;  Surgeon: Greer Pickerel, MD;  Location: [place];  Service: General;  Laterality: N/A;  . LEFT HEART CATH AND CORONARY ANGIOGRAPHY N/A 05/06/2020   Procedure: LEFT HEART CATH AND CORONARY ANGIOGRAPHY;  Surgeon: Nigel Mormon, MD;  Location: [place] [facility];  Service: Cardiovascular;  Laterality: N/A;  . PERIPHERAL VASCULAR BALLOON ANGIOPLASTY  11/22/2017   Procedure: PERIPHERAL VASCULAR BALLOON ANGIOPLASTY;  Surgeon: Serafina Mitchell, MD;  Location: [place] [facility];  Service: Cardiovascular;;  rt. arm fistula  . PERIPHERAL VASCULAR CATHETERIZATION N/A 06/22/2016   Procedure: Left Arm Venography;  Surgeon: Elam Dutch, MD;  Location: [place] [facility];  Service: Cardiovascular;  Laterality: N/A;  . PERIPHERAL VASCULAR CATHETERIZATION N/A 06/22/2016   Procedure: A/V Fistulagram - Right Arm;  Surgeon: Elam Dutch, MD;  Location: [place] [facility];  Service: Cardiovascular;  Laterality: N/A;  . PERIPHERAL VASCULAR CATHETERIZATION Right 06/22/2016   Procedure: Peripheral Vascular Balloon Angioplasty;  Surgeon: Elam Dutch, MD;  Location: [place] [facility];  Service: Cardiovascular;  Laterality: Right;  arm fistula  . POLYPECTOMY  08/22/2020   Procedure: POLYPECTOMY;  Surgeon: Jackquline Denmark, MD;  Location: [facility] END[facility]COPY;  Service: Endoscopy;;  . REVISON OF ARTERIOVENOUS FISTULA Right 03/15/2020   Procedure: RIGHT ARM ARTERIOVENOUS FISTULA REVISON;  Surgeon: [name], MD;  Location: [place];  Service: Vascular;  Laterality: Right;  . SPLENECTOMY, TOTAL N/A 08/23/2020   Procedure: SPLENECTOMY;  Surgeon: Greer Pickerel, MD;  Location: [place];  Service: General;  Laterality: N/A;     Social History:   reports that he quit smoking about 27 years ago. His smoking use included cigarettes. He has a 5.00 pack-year smoking history. He has never used smokeless tobacco. He reports previous alcohol use. He reports previous drug use. Drugs: Marijuana and Cocaine.   Family History:  His family history includes Cancer in his mother; Deep vein thrombosis in his daughter; Diabetes in his mother; Hypertension in his mother.   Allergies Allergies  Allergen Reactions  . Heparin Rash    Pork products   . Pork-Derived Products Rash  . Sulfa Antibiotics Rash     Home Medications  Prior to Admission medications   Medication Sig Start Date End Date Taking? Authorizing Provider  amLODipine (NORVASC) 10 MG tablet Take 1 tablet (10 mg total) by mouth daily. 03/18/20  Yes Aline August, MD  aspirin EC 81 MG tablet Take 81 mg by mouth at bedtime.    Yes [provider]  atorvastatin (LIPITOR) 40 MG tablet Take 40 mg by mouth daily. 01/20/20  Yes [provider]  AURYXIA 1 GM 210 MG(Fe) tablet Take 420 mg by mouth 3 (three) times daily. 02/12/20  Yes [provider]  hydrALAZINE (APRESOLINE) 25 MG tablet Take 1 tablet (25 mg total) by mouth 3 (three) times daily. 05/07/20  Yes Bonnell Public, MD  hydrOXYzine (VISTARIL) 50 MG capsule Take 50 mg by mouth daily at 6 (six) AM. 08/17/20  Yes [provider]  isosorbide mononitrate (IMDUR) 30 MG 24 hr tablet Take 30 mg by mouth daily.  08/15/18 08/19/20 Yes [provider]  labetalol (NORMODYNE) 100 MG tablet Take 100 mg by mouth daily. 08/03/20  Yes [provider]  lidocaine-prilocaine (EMLA) cream  Apply 1 application topically See admin instructions. Apply small amount to dialysis port (AVF) one hour before dialysis. Cover with occlusive dressing (saran wrap) 11/14/17  Yes [provider]  LOKELMA 5 g packet Take 1 packet by mouth daily. 08/03/20  Yes [provider]  methocarbamol (ROBAXIN) 750 MG tablet Take 750 mg by mouth every 8 (eight) hours as needed for muscle spasms. 08/12/20  Yes [provider]  oxyCODONE (OXY IR/ROXICODONE) 5 MG immediate release tablet Take 5 mg by mouth 2 (two) times daily as needed for moderate pain. 04/29/20  Yes [provider]  sucralfate (CARAFATE) 1 g tablet Take 1 g by mouth daily. 08/12/20  Yes [provider]  traZODone (DESYREL) 150 MG tablet Take 150 mg by mouth daily as needed. 08/17/20  Yes [provider]  Vitamin D, Ergocalciferol, (DRISDOL) 1.25 MG (50000 UNIT) CAPS capsule Take 50,000 Units by mouth once a week. 08/03/20  Yes [provider]     Critical care time: 35 min

## 2020-08-30 NOTE — Progress Notes (Signed)
  Speech Language Pathology Treatment: Dysphagia  Patient Details Name: Logan Martin MRN: [id number] DOB: [birth year] Today's Date: 08/30/2020 Time: 9030-0923 SLP Time Calculation (min) (ACUTE ONLY): 20 min  Assessment / Plan / Recommendation Clinical Impression  Pt seen at bedside to assess appropriateness for advanced PO textures. BSE was completed 08/28/20, with recommendation for clear liquids. Pt has been tolerating this diet since then. He continues to exhibit swelling in the posterior oral cavity, but reports he is "ready for it" when asked about trying solid foods. Pt accepted trials of thin liquid, puree, and solid textures. Extended oral prep noted with graham cracker, however, pt indicated he always chews his food thoroughly. No overt s/s aspiration on any consistency given. Will advance diet to mechanical soft/thin liquids, meds whole. SLP will continue to follow to assess diet tolerance and readiness to advance further. RN and MD informed.    HPI HPI: Pt is a 65 y.o. male, with PMH of ESRD, HTN, HLD, CAD, history of adenocarcinoma of the bladder, anemia, thrombocytopenia, hx of PE, hepatitis C who presented to the ED on 08/19/2020 with progressive fatigue and generalized weakness with dark stools for 1 week.   Work-up significant for anemia and melanotic stools. CTA abdomen pelvis 3/26 showed no active source of GI bleeding but showed focal areas of high-grade stenosis within distal branches of the SMA in the right lower quadrant. Esophagram 3/28: No evidence of esophageal leak or other injury. Mild esophageal  dysmotility. No laryngeal penetration or aspiration. CXR 4/1: Persistent left lower lobe collapse. Possible tiny left pleural effusion. Pt s/p colonoscopy on 3/28 which showed diverticulosis, 2 polyps were removed and sent for biopsy but showed no evidence of GI bleed. On 3/29 patient acutely complained of worsening abdominal and chronic back pain, found to have splenic rupture, required  massive transfusion and emergent OR. 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 EGD 3/30 - healing Mallory-Weiss tear, grade 1 esophageal varices, small hiatal hernia. Pt intubated for procedure ETT 3/29-4/2; enlarged uvula noted on extubation. Surgery's note on 4/3 recommended clear liquids if clinically indicated based on swallow evaluation, which was completed 08/28/20. Clear liquid diet was recommended. New order received 08/30/20 to determine if advanced textures could be tolerated.      SLP Plan  Continue with current plan of care       Recommendations  Diet recommendations: Dysphagia 3 (mechanical soft);Thin liquid Liquids provided via: Cup;Straw Medication Administration: Whole meds with liquid Supervision: Patient able to self feed Compensations: Slow rate;Small sips/bites                Oral Care Recommendations: Oral care BID Follow up Recommendations: None SLP Visit Diagnosis: Dysphagia, unspecified (R13.10) Plan: Continue with current plan of care       GO              Logan Martin B. [name], [facility], [place] Speech Language Pathologist Office: [phone]  Shonna Chock 08/30/2020, 10:02 AM

## 2020-08-30 NOTE — Progress Notes (Signed)
Patient ID: Logan Martin, male   DOB: [birth year], 65 y.o.   MRN: [id number]   Acute Care Surgery Service Progress Note:    Chief Complaint/Subjective: No new events. Looks better today - he is up using bedside commode, about to sit in the chair. Worked with PT yesterday. Tolerating CLD without nausea or emesis. Supposed to get HD today.  Objective: Vital signs in last 24 hours: Temp:  [97.5 F (36.4 C)-98.6 F (37 C)] 97.6 F (36.4 C) (04/05 0339) Pulse Rate:  [79-102] 91 (04/05 0500) BP: (80-139)/(24-75) 95/24 (04/05 0500) SpO2:  [81 %-100 %] 95 % (04/05 0500) Last BM Date: 08/29/20  Intake/Output from previous day: 04/04 0701 - 04/05 0700 In: 895 [P.O.:560; I.V.:20; Blood:315] Out: 30 [Drains:30] Intake/Output this shift: No intake/output data recorded.  Lungs:  Nonlabored  Cardiovascular: reg  Abd: soft, mild distension, approp TTP, drain-serosang, midline wound clean and open and packed. Just had a BM using 3-in-1 -- non-bloody brown/green in color.   Blake drain - 30 cc/24h  MSK: upper extremity edema improving   Neuro: alert, nonfocal  Lab Results: CBC  Recent Labs    08/28/20 1029 08/29/20 0120 08/29/20 2030  WBC 11.8* 10.9*  --   HGB 8.7* 7.6* 9.1*  HCT 26.8* 24.0* 27.7*  PLT 244 233  --    BMET Recent Labs    08/29/20 1900 08/30/20 0413  NA 137 138  K 3.7 3.8  CL 97* 99  CO2 27 29  GLUCOSE 87 79  BUN 46* 48*  CREATININE 7.53* 8.14*  CALCIUM 9.1 9.1   LFT Hepatic Function Latest Ref Rng & Units 08/30/2020 08/29/2020 08/29/2020  Total Protein 6.5 - 8.1 g/dL - - 5.0(L)  Albumin 3.5 - 5.0 g/dL 2.3(L) 2.3(L) 2.0(L)  AST 15 - 41 U/L - - 39  ALT 0 - 44 U/L - - 25  Alk Phosphatase 38 - 126 U/L - - 67  Total Bilirubin 0.3 - 1.2 mg/dL - - 0.6  Bilirubin, Direct 0.0 - 0.2 mg/dL - - -    Studies/Results:  Anti-infectives: Anti-infectives (From admission, onward)   Start     Dose/Rate Route Frequency Ordered Stop   08/24/20 1200  vancomycin  (VANCOREADY) IVPB 500 mg/100 mL  Status:  Discontinued        500 mg 100 mL/hr over 60 Minutes Intravenous Every M-W-F (Hemodialysis) 08/23/20 1724 08/23/20 2039   08/24/20 1200  vancomycin (VANCOREADY) IVPB 500 mg/100 mL  Status:  Discontinued        500 mg 100 mL/hr over 60 Minutes Intravenous Every M-W-F (Hemodialysis) 08/23/20 2052 08/26/20 0826   08/23/20 2145  vancomycin (VANCOREADY) IVPB 1000 mg/200 mL  Status:  Discontinued        1,000 mg 200 mL/hr over 60 Minutes Intravenous  Once 08/23/20 2053 08/23/20 2055   08/23/20 2100  piperacillin-tazobactam (ZOSYN) IVPB 2.25 g  Status:  Discontinued        2.25 g 100 mL/hr over 30 Minutes Intravenous Every 8 hours 08/23/20 2052 08/27/20 1254   08/23/20 1730  vancomycin (VANCOREADY) IVPB 1000 mg/200 mL  Status:  Discontinued        1,000 mg 200 mL/hr over 60 Minutes Intravenous  Once 08/23/20 1723 08/23/20 2101   08/23/20 1730  piperacillin-tazobactam (ZOSYN) IVPB 2.25 g  Status:  Discontinued        2.25 g 100 mL/hr over 30 Minutes Intravenous Every 8 hours 08/23/20 1726 08/23/20 2039   08/23/20 1430  piperacillin-tazobactam (ZOSYN) IVPB  3.375 g  Status:  Discontinued        3.375 g 12.5 mL/hr over 240 Minutes Intravenous Every 12 hours 08/23/20 1427 08/23/20 1726   08/20/20 0415  amoxicillin-clavulanate (AUGMENTIN) 500-125 MG per tablet 500 mg  Status:  Discontinued        1 tablet Oral Daily 08/20/20 0329 08/21/20 1258   08/19/20 2345  Ampicillin-Sulbactam (UNASYN) 3 g in sodium chloride 0.9 % 100 mL IVPB  Status:  Discontinued        3 g 200 mL/hr over 30 Minutes Intravenous  Once 08/19/20 2333 08/20/20 0329      Medications: Scheduled Meds: . (feeding supplement) PROSource Plus  30 mL Oral BID BM  . sodium chloride   Intravenous Once  . sodium chloride   Intravenous Once  . calcitRIOL  0.25 mcg Oral Daily  . Chlorhexidine Gluconate Cloth  6 each Topical Q0600  . Chlorhexidine Gluconate Cloth  6 each Topical Q0600  .  darbepoetin (ARANESP) injection - DIALYSIS  200 mcg Intravenous Q Mon-HD  . feeding supplement  1 Container Oral TID BM  . insulin aspart  0-6 Units Subcutaneous TID WC  . mouth rinse  15 mL Mouth Rinse BID  . pantoprazole  40 mg Intravenous Q12H  . sodium chloride flush  10-40 mL Intracatheter Q12H  . traZODone  150 mg Oral QHS   Continuous Infusions: . sodium chloride    . sodium chloride    . sodium chloride Stopped (08/25/20 2128)  . sodium chloride    . sodium chloride     PRN Meds:.sodium chloride, sodium chloride, sodium chloride, sodium chloride, sodium chloride, acetaminophen, alteplase, alteplase, heparin, labetalol, lidocaine (PF), lidocaine (PF), lidocaine, lidocaine-prilocaine, lidocaine-prilocaine, ondansetron **OR** ondansetron (ZOFRAN) IV, pentafluoroprop-tetrafluoroeth, pentafluoroprop-tetrafluoroeth, sodium chloride, sodium chloride flush  Assessment/Plan: Patient Active Problem List   Diagnosis Date Noted  . Pressure injury of skin 08/28/2020  . Abdominal pain   . Acute respiratory failure ([place])   . Tachycardia 08/20/2020  . Oral infection 08/20/2020  . Non-ST elevation (NSTEMI) myocardial infarction ([place])   . ESRD (end stage renal disease) on dialysis ([place]) 05/05/2020  . Prolonged QT interval 05/05/2020  . Allergy, unspecified, sequela 03/18/2020  . Pain, unspecified 03/18/2020  . Personal history of anaphylaxis 03/18/2020  . Cellulitis 03/10/2020  . Chronic low back pain   . Peripheral vascular disease ([place])   . Other mechanical complication of surgically created arteriovenous fistula, initial encounter ([place]) 03/09/2020  . Unspecified open wound of right upper arm, subsequent encounter 03/09/2020  . Hypercalcemia 01/20/2020  . Other disorders of phosphorus metabolism 01/01/2020  . Severe mitral regurgitation 12/22/2018  . Hypercholesterolemia 12/22/2018  . History of bladder cancer 08/14/2018  . Personal history of pulmonary embolism 08/14/2018  . Red  blood cell antibody positive, compatible PRBC difficult to obtain 08/14/2018  . Malignant neoplasm of overlapping sites of bladder ([place]) 07/20/2018  . Volume overload 06/18/2018  . Fluid overload 06/17/2018  . Elevated troponin 06/17/2018  . Hematuria 03/27/2018  . Hypokalemia 03/27/2018  . Thrombocytopenia ([place]) 03/27/2018  . Anemia in ESRD (end-stage renal disease) ([place]) 03/27/2018  . Bladder tumor 03/27/2018  . Encounter for adequacy testing for peritoneal dialysis ([place]) 01/03/2018  . 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 Compression of vein 11/12/2017  . Hypertension with heart disease 10/14/2017  . Pyelocystitis 10/13/2017  . 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 Cramp and spasm 02/20/2017  . Acidosis 02/18/2017  . Dependence on renal dialysis ([place]) 02/18/2017  . Hypertensive  heart and chronic kidney disease with heart failure and with stage 5 chronic kidney disease, or end stage renal disease ([place]) 02/18/2017  . Iron deficiency anemia, unspecified 02/18/2017  . Moderate protein-calorie malnutrition ([place]) 02/18/2017  . Other dietary vitamin B12 deficiency anemia 02/18/2017  . Other disorders of electrolyte and fluid balance, not elsewhere classified 02/18/2017  . Other disorders resulting from impaired renal tubular function 02/18/2017  . Personal history of nicotine dependence 02/18/2017  . Cardiomegaly 01/21/2017  . Congestive heart failure ([place]) 01/21/2017  . Pancytopenia ([place]) 06/14/2016  . Left arm pain 06/14/2016  . 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 Mechanical complication of other vascular device, implant, and graft 08/10/2013  . End stage renal disease ([place]) 08/10/2013  . ESRD (end stage renal disease) ([place]) 06/07/2013  . Nausea and vomiting 06/07/2013  . SOB (shortness of breath) 05/10/2013  . Chest discomfort 11/27/2012  . Shortness of breath 11/27/2012  . Chronic hepatitis C without hepatic coma ([place]) 02/04/2012  . Secondary hyperparathyroidism  (of renal origin) 02/04/2012  . Anemia associated with chronic renal failure 02/04/2012  . CAD (coronary atherosclerotic disease) 02/04/2012  . Pulmonary embolism ([place]) 07/20/2010  . Chronic pain syndrome 06/08/2010  . Renal failure 04/26/2010  . Atherosclerosis of native coronary artery of native heart without angina pectoris 02/23/2009  . HEMORRHOIDS, INTERNAL 02/23/2009  . GERD 02/23/2009  . ESRD on dialysis ([place]) 02/16/2009  . Allergic rhinitis 05/06/2008   ESRD on HD HTN HLD CAD PVD 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 Lovena Neighbours), 3/16/2020Cystoprostatectomy + resection bladder augmentation + enterolysis([facility], Dr. Zenia Resides) [place] with mesh (02/2019 Dr. Kieth Brightly)  thrombocytopenia PMH PE Hepatitis C   Acute on chronic anemia - transfuse as needed per primary. hgb down slightly  GI Bleed requiring transfusion - CTA abdomen pelvis 3/28 with no evidence of GIB - s/p colonoscopy 3/28 Dr. Lyndel Safe; two colon polyps removed, diverticulosis noted, no bleeding; required intubated for procedure due to flash pulm edema - initial intubation attempt was in the esophagus. Esophogram negative for esophageal injury/leak. - Dr. Lyndel Safe performed bedside endoscopy 3/30 showing Grade I esophageal varices, Small HH with healing Mallory-Weiss tear. No active bleeding. Gastritis present.   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 Greer Pickerel S/p re-exploration, ex lap, washout, abdominal closure 3/31 Dr. Redmond Pulling - POD#5, afebrile - drain serosang - cleared by SLP for CLD, advance diet as tolerated per speech recs  - continue PPI, continue to monitor BMs and CBC in AM. No melena or hematochezia this AM when he had a BM. - no antibiotics needed from abdominal standpoint - PT/OT consult for mobilization (currently recommend HH PT)   FEN: CLD, pre-albumin 16.4 on 4/4; ADAT per SLP ID:  Zosyn 3/29 >> 4/3 VTE: SCD's, chemical VTE held in the setting of resolving hemorrhagic shock  Foley: none, anuric at baseline Dispo: SLP eval and possible diet advancement, continue BID PPI    LOS: 10 days    Obie Dredge, PA-C General & Trauma Surgery [phone] [facility]

## 2020-08-30 NOTE — Progress Notes (Signed)
[facility] Progress Note   Subjective:  Seen in room. No new complaints. Due to high census of HD patients yesterday, he was rolled over to today - will be dialyzed today. B arms remain edematous, none in lower legs.  Objective Vitals:   08/30/20 0808 08/30/20 0900 08/30/20 1000 08/30/20 1109  BP: (!) 114/53 (!) 97/52 121/76   Pulse: 92 91 98   Resp:      Temp: 98.3 F (36.8 C)   97.7 F (36.5 C)  TempSrc: Oral   Axillary  SpO2: (!) 86% 95% 94%   Weight:      Height:       Physical Exam General: Well appearing man, NAD. Nasal O2 in place Heart: RRR; no murmur Lungs: CTA in upper lobes, dull bases Abdomen: soft, non-tender Extremities: No LE edema, 2+ BUE edema (R>L) with small blister to R forearm. Dialysis Access: RUE AVF + thrill  Additional Objective Labs: Basic Metabolic Panel: Recent Labs  Lab 08/29/20 0120 08/29/20 1900 08/30/20 0413  NA 139 137 138  K 3.7 3.7 3.8  CL 100 97* 99  CO2 29 27 29   GLUCOSE 82 87 79  BUN 37* 46* 48*  CREATININE 6.32* 7.53* 8.14*  CALCIUM 8.7* 9.1 9.1  PHOS 3.5 4.4 4.6   Liver Function Tests: Recent Labs  Lab 08/26/20 0432 08/26/20 1902 08/27/20 0423 08/28/20 0404 08/29/20 0120 08/29/20 1900 08/30/20 0413  AST 79*  --  58*  --  39  --   --   ALT 82*  --  48*  --  25  --   --   ALKPHOS 56  --  53  --  67  --   --   BILITOT 0.7  --  0.6  --  0.6  --   --   PROT 5.0*  --  4.9*  --  5.0*  --   --   ALBUMIN 2.3*   < > 2.1*   < > 2.0* 2.3* 2.3*   < > = values in this interval not displayed.   Recent Labs  Lab 08/23/20 1331  LIPASE 26   CBC: Recent Labs  Lab 08/25/20 1351 08/26/20 0432 08/27/20 0423 08/28/20 1029 08/29/20 0120 08/29/20 2030  WBC 16.0* 13.1* 10.4 11.8* 10.9*  --   NEUTROABS 12.6* 10.7*  --   --  8.4*  --   HGB 8.4* 8.1* 7.4* 8.7* 7.6* 9.1*  HCT 24.6* 24.0* 22.2* 26.8* 24.0* 27.7*  MCV 90.1 92.3 92.1 95.0 96.4  --   PLT 139* 155 163 244 233  --    CBG: Recent Labs  Lab  08/29/20 1107 08/29/20 1551 08/29/20 2129 08/30/20 0815 08/30/20 1107  GLUCAP 87 85 73 83 86   Medications: . sodium chloride    . sodium chloride    . sodium chloride Stopped (08/25/20 2128)  . sodium chloride    . sodium chloride     . (feeding supplement) PROSource Plus  30 mL Oral BID BM  . sodium chloride   Intravenous Once  . sodium chloride   Intravenous Once  . calcitRIOL  0.25 mcg Oral Daily  . Chlorhexidine Gluconate Cloth  6 each Topical Q0600  . Chlorhexidine Gluconate Cloth  6 each Topical Q0600  . darbepoetin (ARANESP) injection - DIALYSIS  200 mcg Intravenous Q Mon-HD  . feeding supplement  1 Container Oral TID BM  . insulin aspart  0-6 Units Subcutaneous TID WC  . mouth rinse  15 mL  Mouth Rinse BID  . [START ON 08/31/2020] midodrine  5 mg Oral 3 times per day on Mon Wed Fri  . pantoprazole  40 mg Intravenous Q12H  . sodium chloride flush  10-40 mL Intracatheter Q12H  . traZODone  150 mg Oral QHS    Dialysis Orders: Home hemodialysis ([facility]) MTTF, EDW 46.5kg - Aranesp last given 3/26.  Assessment/Plan: 1. Splenic rupture: S/p emergent splenectomy 3/29, then re-exploration/washout/closure 3/31. Surgery following.  2. Hemorrhagic shock/GI Bleed: s/p colonoscopy 3/28 - 2 polyps removed. Esophogram negative for esophageal injury/leak. Bedside 3/30 with grade I esophageal varices, small HH with healing mallory-weiss tear. No active bleeding.  3. ESRD: Previously on home hemo -> following MWF schedule during admit, had to be rolled over from yesterday d/t high census -> HD today. 4. HTN/volume: With edematous BUE - attempted extra HD 4/3 but stopped d/t hypotension. BP very low yesterday - amlodipine, labetalol, and hydralazine all d/c'd and midodrine added. Per weights, he is about 12kg up so needs aggressive UF. Per notes, he has stents in R subclavian and L innominate veins - wonder if they are occluded which may be contributing to BUE edema. If not improving  with HD, will need to consult VVS or IR for central venogram. 5. Anemia of ESRD + hemorrhage (above): s/p multiple transfusions. Due for Aranesp - will give 272mcg today. 6. Secondary HPTH: Phos low side, d/c'd Auryxia (can cause dark stools which can confound Hx GIB). CorrCa ok. Holding sensipar, remains on calcitriol daily. 7. Nutrition: Alb very low - continue supplements.   Veneta Penton, PA-C 08/30/2020, 11:23 AM  [employer]

## 2020-08-31 DIAGNOSIS — I2510 Atherosclerotic heart disease of native coronary artery without angina pectoris: Secondary | ICD-10-CM | POA: Diagnosis not present

## 2020-08-31 DIAGNOSIS — R1010 Upper abdominal pain, unspecified: Secondary | ICD-10-CM | POA: Diagnosis not present

## 2020-08-31 DIAGNOSIS — I200 Unstable angina: Secondary | ICD-10-CM

## 2020-08-31 DIAGNOSIS — J9600 Acute respiratory failure, unspecified whether with hypoxia or hypercapnia: Secondary | ICD-10-CM | POA: Diagnosis not present

## 2020-08-31 DIAGNOSIS — N189 Chronic kidney disease, unspecified: Secondary | ICD-10-CM | POA: Diagnosis not present

## 2020-08-31 LAB — GLUCOSE, CAPILLARY
Glucose-Capillary: 75 mg/dL (ref 70–99)
Glucose-Capillary: 81 mg/dL (ref 70–99)
Glucose-Capillary: 91 mg/dL (ref 70–99)

## 2020-08-31 LAB — CBC
HCT: 28 % — ABNORMAL LOW (ref 39.0–52.0)
Hemoglobin: 9.2 g/dL — ABNORMAL LOW (ref 13.0–17.0)
MCH: 30.7 pg (ref 26.0–34.0)
MCHC: 32.9 g/dL (ref 30.0–36.0)
MCV: 93.3 fL (ref 80.0–100.0)
Platelets: 293 10*3/uL (ref 150–400)
RBC: 3 MIL/uL — ABNORMAL LOW (ref 4.22–5.81)
RDW: 17.2 % — ABNORMAL HIGH (ref 11.5–15.5)
WBC: 9.2 10*3/uL (ref 4.0–10.5)
nRBC: 0.4 % — ABNORMAL HIGH (ref 0.0–0.2)

## 2020-08-31 LAB — RENAL FUNCTION PANEL
Albumin: 2.7 g/dL — ABNORMAL LOW (ref 3.5–5.0)
Anion gap: 9 (ref 5–15)
BUN: 20 mg/dL (ref 8–23)
CO2: 28 mmol/L (ref 22–32)
Calcium: 8.9 mg/dL (ref 8.9–10.3)
Chloride: 98 mmol/L (ref 98–111)
Creatinine, Ser: 5.23 mg/dL — ABNORMAL HIGH (ref 0.61–1.24)
GFR, Estimated: 12 mL/min — ABNORMAL LOW (ref >60)
Glucose, Bld: 78 mg/dL (ref 70–99)
Phosphorus: 3.1 mg/dL (ref 2.5–4.6)
Potassium: 3.5 mmol/L (ref 3.5–5.1)
Sodium: 135 mmol/L (ref 135–145)

## 2020-08-31 LAB — MAGNESIUM: Magnesium: 2.1 mg/dL (ref 1.7–2.4)

## 2020-08-31 MED ORDER — PNEUMOCOCCAL 13-VAL CONJ VACC IM SUSP
0.5000 mL | Freq: Once | INTRAMUSCULAR | Status: AC
  Administered 2020-08-31: 0.5 mL via INTRAMUSCULAR
  Filled 2020-08-31 (×2): qty 0.5

## 2020-08-31 MED ORDER — DARBEPOETIN ALFA 200 MCG/0.4ML IJ SOSY: 200.0000 ug | PREFILLED_SYRINGE | INTRAMUSCULAR | Status: DC

## 2020-08-31 MED ORDER — PANTOPRAZOLE SODIUM 40 MG PO TBEC: 40.0000 mg | DELAYED_RELEASE_TABLET | Freq: Two times a day (BID) | ORAL | Status: DC

## 2020-08-31 MED ORDER — MENINGOCOCCAL A C Y&W-135 OLIG IM SOLR
0.5000 mL | Freq: Once | INTRAMUSCULAR | Status: AC
  Administered 2020-08-31: 0.5 mL via INTRAMUSCULAR
  Filled 2020-08-31 (×2): qty 0.5

## 2020-08-31 MED ORDER — HAEMOPHILUS B POLYSAC CONJ VAC IM SOLR
0.5000 mL | Freq: Once | INTRAMUSCULAR | Status: AC
  Administered 2020-08-31: 0.5 mL via INTRAMUSCULAR
  Filled 2020-08-31: qty 0.5

## 2020-08-31 MED ORDER — RENA-VITE PO TABS: 1.0000 | ORAL_TABLET | Freq: Every day | ORAL | Status: DC

## 2020-08-31 NOTE — Progress Notes (Signed)
Patient ID: Logan Martin, male   DOB: 09/02/1955, 64 y.o.   MRN: [id number]   Acute Care Surgery Service Progress Note:    Chief Complaint/Subjective: NAEO. Abd pain controlled. Tolerating DYS 3 diet. His biggest complaint is just early fatigue with movement. Denies any known blood in his stool.  Objective: Vital signs in last 24 hours: Temp:  [97.6 F (36.4 C)-98.5 F (36.9 C)] 97.7 F (36.5 C) (04/06 0423) Pulse Rate:  [69-98] 92 (04/06 0423) Resp:  [0-23] 18 (04/06 0423) BP: (83-131)/(23-101) 101/62 (04/06 0423) SpO2:  [91 %-100 %] 100 % (04/06 0423) Weight:  [53.9 kg-57.8 kg] 53.9 kg (04/06 0423) Last BM Date: 08/30/20  Intake/Output from previous day: 04/05 0701 - 04/06 0700 In: 610 [P.O.:600; I.V.:10] Out: 2040 [Drains:40] Intake/Output this shift: No intake/output data recorded.  Lungs:  Nonlabored  Cardiovascular: reg  Abd: soft, mild distension, approp TTP, midline wound clean (see below). Blake drain - 40 cc/24h SS   MSK: upper extremity edema improving   Neuro: alert, nonfocal  Lab Results: CBC  Recent Labs    08/30/20 1049 08/31/20 0450  WBC 10.5 9.2  HGB 9.3* 9.2*  HCT 28.6* 28.0*  PLT 302 293   BMET Recent Labs    08/30/20 1724 08/31/20 0450  NA 137 135  K 3.4* 3.5  CL 99 98  CO2 29 28  GLUCOSE 75 78  BUN 16 20  CREATININE 4.05* 5.23*  CALCIUM 9.2 8.9   LFT Hepatic Function Latest Ref Rng & Units 08/31/2020 08/30/2020 08/30/2020  Total Protein 6.5 - 8.1 g/dL - - -  Albumin 3.5 - 5.0 g/dL 2.7(L) 2.9(L) 2.3(L)  AST 15 - 41 U/L - - -  ALT 0 - 44 U/L - - -  Alk Phosphatase 38 - 126 U/L - - -  Total Bilirubin 0.3 - 1.2 mg/dL - - -  Bilirubin, Direct 0.0 - 0.2 mg/dL - - -    Studies/Results:  Anti-infectives: Anti-infectives (From admission, onward)   Start     Dose/Rate Route Frequency Ordered Stop   08/24/20 1200  vancomycin (VANCOREADY) IVPB 500 mg/100 mL  Status:  Discontinued        500 mg 100 mL/hr over 60 Minutes Intravenous  Every M-W-F (Hemodialysis) 08/23/20 1724 08/23/20 2039   08/24/20 1200  vancomycin (VANCOREADY) IVPB 500 mg/100 mL  Status:  Discontinued        500 mg 100 mL/hr over 60 Minutes Intravenous Every M-W-F (Hemodialysis) 08/23/20 2052 08/26/20 0826   08/23/20 2145  vancomycin (VANCOREADY) IVPB 1000 mg/200 mL  Status:  Discontinued        1,000 mg 200 mL/hr over 60 Minutes Intravenous  Once 08/23/20 2053 08/23/20 2055   08/23/20 2100  piperacillin-tazobactam (ZOSYN) IVPB 2.25 g  Status:  Discontinued        2.25 g 100 mL/hr over 30 Minutes Intravenous Every 8 hours 08/23/20 2052 08/27/20 1254   08/23/20 1730  vancomycin (VANCOREADY) IVPB 1000 mg/200 mL  Status:  Discontinued        1,000 mg 200 mL/hr over 60 Minutes Intravenous  Once 08/23/20 1723 08/23/20 2101   08/23/20 1730  piperacillin-tazobactam (ZOSYN) IVPB 2.25 g  Status:  Discontinued        2.25 g 100 mL/hr over 30 Minutes Intravenous Every 8 hours 08/23/20 1726 08/23/20 2039   08/23/20 1430  piperacillin-tazobactam (ZOSYN) IVPB 3.375 g  Status:  Discontinued        3.375 g 12.5 mL/hr over 240 Minutes   Intravenous Every 12 hours 08/23/20 1427 08/23/20 1726   08/20/20 0415  amoxicillin-clavulanate (AUGMENTIN) 500-125 MG per tablet 500 mg  Status:  Discontinued        1 tablet Oral Daily 08/20/20 0329 08/21/20 1258   08/19/20 2345  Ampicillin-Sulbactam (UNASYN) 3 g in sodium chloride 0.9 % 100 mL IVPB  Status:  Discontinued        3 g 200 mL/hr over 30 Minutes Intravenous  Once 08/19/20 2333 08/20/20 0329      Medications: Scheduled Meds: . (feeding supplement) PROSource Plus  30 mL Oral BID BM  . sodium chloride   Intravenous Once  . sodium chloride   Intravenous Once  . calcitRIOL  0.25 mcg Oral Daily  . Chlorhexidine Gluconate Cloth  6 each Topical Q0600  . Chlorhexidine Gluconate Cloth  6 each Topical Q0600  . darbepoetin (ARANESP) injection - DIALYSIS  200 mcg Intravenous Q Tue-HD  . feeding supplement (NEPRO CARB STEADY)   237 mL Oral TID BM  . insulin aspart  0-6 Units Subcutaneous TID WC  . mouth rinse  15 mL Mouth Rinse BID  . midodrine  5 mg Oral 3 times per day on Mon Wed Fri  . pantoprazole  40 mg Intravenous Q12H  . sodium chloride flush  10-40 mL Intracatheter Q12H  . traZODone  150 mg Oral QHS   Continuous Infusions: . sodium chloride Stopped (08/25/20 2128)   PRN Meds:.sodium chloride, acetaminophen, heparin, labetalol, lidocaine, ondansetron **OR** ondansetron (ZOFRAN) IV, oxyCODONE, simethicone, sodium chloride, sodium chloride flush  Assessment/Plan: Patient Active Problem List   Diagnosis Date Noted  . Pressure injury of skin 08/28/2020  . Abdominal pain   . Acute respiratory failure (HCC)   . Tachycardia 08/20/2020  . Oral infection 08/20/2020  . Non-ST elevation (NSTEMI) myocardial infarction (HCC)   . ESRD (end stage renal disease) on dialysis (HCC) 05/05/2020  . Prolonged QT interval 05/05/2020  . Allergy, unspecified, sequela 03/18/2020  . Pain, unspecified 03/18/2020  . Personal history of anaphylaxis 03/18/2020  . Cellulitis 03/10/2020  . Chronic low back pain   . Peripheral vascular disease (HCC)   . Other mechanical complication of surgically created arteriovenous fistula, initial encounter (HCC) 03/09/2020  . Unspecified open wound of right upper arm, subsequent encounter 03/09/2020  . Hypercalcemia 01/20/2020  . Other disorders of phosphorus metabolism 01/01/2020  . Severe mitral regurgitation 12/22/2018  . Hypercholesterolemia 12/22/2018  . History of bladder cancer 08/14/2018  . Personal history of pulmonary embolism 08/14/2018  . Red blood cell antibody positive, compatible PRBC difficult to obtain 08/14/2018  . Malignant neoplasm of overlapping sites of bladder (HCC) 07/20/2018  . Volume overload 06/18/2018  . Fluid overload 06/17/2018  . Elevated troponin 06/17/2018  . Hematuria 03/27/2018  . Hypokalemia 03/27/2018  . Thrombocytopenia (HCC) 03/27/2018  .  Anemia in ESRD (end-stage renal disease) (HCC) 03/27/2018  . Bladder tumor 03/27/2018  . Encounter for adequacy testing for peritoneal dialysis (HCC) 01/03/2018  . 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 Compression of vein 11/12/2017  . Hypertension with heart disease 10/14/2017  . Pyelocystitis 10/13/2017  . 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 Cramp and spasm 02/20/2017  . Acidosis 02/18/2017  . Dependence on renal dialysis (HCC) 02/18/2017  . Hypertensive heart and chronic kidney disease with heart failure and with stage 5 chronic kidney disease, or end stage renal disease (HCC) 02/18/2017  . Iron deficiency anemia, unspecified 02/18/2017  . Moderate protein-calorie malnutrition (HCC) 02/18/2017  . Other dietary vitamin B12   deficiency anemia 02/18/2017  . Other disorders of electrolyte and fluid balance, not elsewhere classified 02/18/2017  . Other disorders resulting from impaired renal tubular function 02/18/2017  . Personal history of nicotine dependence 02/18/2017  . Cardiomegaly 01/21/2017  . Congestive heart failure (HCC) 01/21/2017  . Pancytopenia (HCC) 06/14/2016  . Left arm pain 06/14/2016  . 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 Mechanical complication of other vascular device, implant, and graft 08/10/2013  . End stage renal disease (HCC) 08/10/2013  . ESRD (end stage renal disease) (HCC) 06/07/2013  . Nausea and vomiting 06/07/2013  . SOB (shortness of breath) 05/10/2013  . Chest discomfort 11/27/2012  . Shortness of breath 11/27/2012  . Chronic hepatitis C without hepatic coma (HCC) 02/04/2012  . Secondary hyperparathyroidism (of renal origin) 02/04/2012  . Anemia associated with chronic renal failure 02/04/2012  . CAD (coronary atherosclerotic disease) 02/04/2012  . Pulmonary embolism (HCC) 07/20/2010  . Chronic pain syndrome 06/08/2010  . Renal failure 04/26/2010  . Atherosclerosis of native coronary artery of native heart without angina pectoris  02/23/2009  . HEMORRHOIDS, INTERNAL 02/23/2009  . GERD 02/23/2009  . ESRD on dialysis (HCC) 02/16/2009  . Allergic rhinitis 05/06/2008   ESRD on HD HTN HLD CAD PVD 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 Winter), 3/16/2020Cystoprostatectomy + resection bladder augmentation + enterolysis([facility], Dr. Allen) PMH LIH with mesh (02/2019 Dr. Kinsinger)  thrombocytopenia PMH PE Hepatitis C   Acute on chronic anemia - transfuse as needed per primary. hgb/hct stable  GI Bleed requiring transfusion - CTA abdomen pelvis 3/28 with no evidence of GIB - s/p colonoscopy 3/28 Dr. Gupta; two colon polyps removed, diverticulosis noted, no bleeding; required intubated for procedure due to flash pulm edema - initial intubation attempt was in the esophagus. Esophogram negative for esophageal injury/leak. - Dr. Gupta performed bedside endoscopy 3/30 showing Grade I esophageal varices, Small HH with healing Mallory-Weiss tear. No active bleeding. Gastritis present.   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 Eric Wilson S/p re-exploration, ex lap, washout, abdominal closure 3/31 Dr. Wilson - POD#6, afebrile - drain serosang - cleared by SLP for DYS3, advance diet as tolerated per speech recs, continue protein supplements - continue PPI - no antibiotics needed from abdominal standpoint - PT/OT   FEN: DYS3, thin liquids, pre-albumin 16.4 on 4/4; ADAT per SLP ID: Zosyn 3/29 >> 4/3 VTE: SCD's, chemical VTE held in the setting of resolving hemorrhagic shock - has been hemodynamically stable for 48 hours. Would be ok for chemical VTE PPx tomorrow 4/7 if remains stable. Foley: none, anuric at baseline Dispo: ADAT, therapies, will need post-splenectomy vaccines prior to discharge    LOS: 11 days    Elizabeth Simaan, PA-C General & Trauma Surgery [phone] [facility]  

## 2020-08-31 NOTE — Progress Notes (Addendum)
Serous blister on left distal anterior forearm burst due to trauma of repositioning. Pink, intact skin noted at site of wound, approximately 5.08 x 2.54 x 0.1cm in size. Serous drainage noted from site of wound. Dressed wound with xeroform dressing and gauze.

## 2020-08-31 NOTE — Discharge Instructions (Signed)
[facility]      [facility] [phone]  OPEN ABDOMINAL SURGERY: POST OP INSTRUCTIONS  Always review your discharge instruction sheet given to you by the facility where your surgery was performed.  IF YOU HAVE DISABILITY OR FAMILY LEAVE FORMS, YOU MUST BRING THEM TO THE OFFICE FOR PROCESSING.  PLEASE DO NOT GIVE THEM TO YOUR DOCTOR.  1. A prescription for pain medication may be given to you upon discharge.  Take your pain medication as prescribed, if needed.  If narcotic pain medicine is not needed, then you may take acetaminophen (Tylenol) or ibuprofen (Advil) as needed. 2. Take your usually prescribed medications unless otherwise directed. 3. If you need a refill on your pain medication, please contact your pharmacy. They will contact our office to request authorization.  Prescriptions will not be filled after 5pm or on week-ends. 4. You should follow a light diet the first few days after arrival home, such as soup and crackers, pudding, etc.unless your doctor has advised otherwise. A high-fiber, low fat diet can be resumed as tolerated.   Be sure to include lots of fluids daily. Most patients will experience some swelling and bruising on the chest and neck area.  Ice packs will help.  Swelling and bruising can take several days to resolve 5. Most patients will experience some swelling and bruising in the area of the incision. Ice pack will help. Swelling and bruising can take several days to resolve..  6. It is common to experience some constipation if taking pain medication after surgery.  Increasing fluid intake and taking a stool softener will usually help or prevent this problem from occurring.  A mild laxative (Milk of Magnesia or Miralax) should be taken according to package directions if there are no bowel movements after 48 hours. 7.  You may have steri-strips (small skin tapes) in place directly over the incision.  These strips should be left on the skin for 7-10 days.  If your  surgeon used skin glue on the incision, you may shower in 24 hours.  The glue will flake off over the next 2-3 weeks.  Any sutures or staples will be removed at the office during your follow-up visit. You may find that a light gauze bandage over your incision may keep your staples from being rubbed or pulled. You may shower and replace the bandage daily. 8. ACTIVITIES:  You may resume regular (light) daily activities beginning the next day--such as daily self-care, walking, climbing stairs--gradually increasing activities as tolerated.  You may have sexual intercourse when it is comfortable.  Refrain from any heavy lifting or straining until approved by your doctor. a. You may drive when you no longer are taking prescription pain medication, you can comfortably wear a seatbelt, and you can safely maneuver your car and apply brakes b. Return to Work: ___________________________________ 9. You should see your doctor in the office for a follow-up appointment approximately two weeks after your surgery.  Make sure that you call for this appointment within a day or two after you arrive home to insure a convenient appointment time. OTHER INSTRUCTIONS:  _____________________________________________________________ _____________________________________________________________  WHEN TO CALL YOUR DOCTOR: 1. Fever over 101.0 2. Inability to urinate 3. Nausea and/or vomiting 4. Extreme swelling or bruising 5. Continued bleeding from incision. 6. Increased pain, redness, or drainage from the incision. 7. Difficulty swallowing or breathing 8. Muscle cramping or spasms. 9. Numbness or tingling in hands or feet or around lips.  The clinic staff is available to   answer your questions during regular business hours.  Please don't hesitate to call and ask to speak to one of the nurses if you have concerns.  For further questions, please visit [email or web address]   

## 2020-08-31 NOTE — Progress Notes (Signed)
Physical Therapy Treatment Patient Details Name: Logan Martin MRN: [id number] DOB: [birth year] Today's Date: 08/31/2020    History of Present Illness 65 y.o. male, with PMH of ESRD, HTN, HLD, CAD, history of adenocarcinoma of the bladder, anemia, thrombocytopenia, hx of PE, hepatitis C who presented to the ER on 08/19/2020 with general malaise, tachycardia. +melena, colonoscopy 3/28 required intubation/extubation and then pt became hypotensive and reintubated; significant hemoperitoneum with splenic rupture and underwent exploratory laparotomy with splenectomy; 3/31 re-exp lap with closure; 4/2 extubated    PT Comments    Pt tolerates treatment well with improved ambulation distances and transfer quality. Pt demonstrates improved strength and power, elevating from bed without assistance into a standing position. Pt ambulates with 4 wheeled walker at home and will benefit from ambulating with this device next session to assess balance. Pt will also benefit from assessment of oxygen needs, this PT unable to obtain a reliable pulse ox reading during this session. Pt will benefit from continued aggressive mobilization and PT POC to aide in a return to independence.  Follow Up Recommendations  Home health PT;Supervision for mobility/OOB     Equipment Recommendations  None recommended by PT (will trial gait with rollator next session)    Recommendations for Other Services       Precautions / Restrictions Precautions Precautions: Fall Precaution Comments: JP drain Restrictions Weight Bearing Restrictions: No    Mobility  Bed Mobility Overal bed mobility: Needs Assistance Bed Mobility: Supine to Sit;Sit to Supine     Supine to sit: Supervision Sit to supine: Supervision        Transfers Overall transfer level: Needs assistance Equipment used: Rolling walker (2 wheeled) Transfers: Sit to/from Stand Sit to Stand: Supervision         General transfer comment: pt performs one sit  to stand and then 5x sit to stand at end of session, PT cues for hand placement  Ambulation/Gait Ambulation/Gait assistance: Supervision Gait Distance (Feet): 150 Feet Assistive device: Rolling walker (2 wheeled) Gait Pattern/deviations: Step-through pattern Gait velocity: reduced Gait velocity interpretation: <1.31 ft/sec, indicative of household ambulator General Gait Details: pt with slowed step-through gait   Stairs             Wheelchair Mobility    Modified Rankin (Stroke Patients Only)       Balance Overall balance assessment: Needs assistance Sitting-balance support: No upper extremity supported;Feet supported Sitting balance-Leahy Scale: Good     Standing balance support: Single extremity supported Standing balance-Leahy Scale: Poor Standing balance comment: dependent on UE support of RW                            Cognition Arousal/Alertness: Awake/alert Behavior During Therapy: WFL for tasks assessed/performed Overall Cognitive Status: Within Functional Limits for tasks assessed                                        Exercises      General Comments General comments (skin integrity, edema, etc.): pt on 2L [place] during session, PT unable to obtain pleth reading from portable pulse oximeter. Pt reports mild SOB at end of ambulation, pt performs pursed lip breathing well.      Pertinent Vitals/Pain Pain Assessment: Faces Faces Pain Scale: Hurts little more Pain Location: back Pain Descriptors / Indicators: Grimacing Pain Intervention(s): Patient requesting pain meds-RN notified  Home Living                      Prior Function            PT Goals (current goals can now be found in the care plan section) Acute Rehab PT Goals Patient Stated Goal: return home Time For Goal Achievement: 09/11/20 Potential to Achieve Goals: Good Progress towards PT goals: Progressing toward goals    Frequency    Min  3X/week      PT Plan Current plan remains appropriate    Co-evaluation              AM-PAC PT "6 Clicks" Mobility   Outcome Measure  Help needed turning from your back to your side while in a flat bed without using bedrails?: A Little Help needed moving from lying on your back to sitting on the side of a flat bed without using bedrails?: A Little Help needed moving to and from a bed to a chair (including a wheelchair)?: A Little Help needed standing up from a chair using your arms (e.g., wheelchair or bedside chair)?: A Little Help needed to walk in hospital room?: A Little Help needed climbing 3-5 steps with a railing? : A Lot 6 Click Score: 17    End of Session Equipment Utilized During Treatment: Oxygen Activity Tolerance: Patient tolerated treatment well Patient left: in bed;with call bell/phone within reach;with bed alarm set Nurse Communication: Mobility status PT Visit Diagnosis: Unsteadiness on feet (R26.81);Muscle weakness (generalized) (M62.81)     Time: 8466-5993 PT Time Calculation (min) (ACUTE ONLY): 17 min  Charges:  $Therapeutic Activity: 8-22 mins                     Zenaida Niece, PT, DPT Acute Rehabilitation Pager: [phone]    Zenaida Niece 08/31/2020, 12:14 PM

## 2020-08-31 NOTE — Progress Notes (Signed)
Pt request leaving AMA over family emergency. Will contact the provider and update. Pt just returned from HD. Pt stable at this time alert and oriented x4,

## 2020-08-31 NOTE — Progress Notes (Signed)
[facility] Progress Note   Subjective:  Seen in room. No new complaints. Awaiting HD again today.   Objective Vitals:   08/31/20 0423 08/31/20 0827 08/31/20 1128 08/31/20 1158  BP: 101/62 138/60 (!) 148/85 (!) 184/81  Pulse: 92 96 (!) 102 (!) 102  Resp: 18 18 18 18   Temp: 97.7 F (36.5 C) 98.7 F (37.1 C) 98.3 F (36.8 C) 98.5 F (36.9 C)  TempSrc: Oral Oral Oral Oral  SpO2: 100% 91% 97% 94%  Weight: 53.9 kg   54.7 kg  Height:       Physical Exam General: Well appearing man, NAD. Nasal O2 in place Heart: RRR; no murmur Lungs: CTA in upper lobes, dull bases Abdomen: soft, non-tender Extremities: No LE edema, 2+ BUE edema (R>L) with small blister to R forearm. Dialysis Access: RUE AVF + thrill  Additional Objective Labs: Basic Metabolic Panel: Recent Labs  Lab 08/30/20 0413 08/30/20 1724 08/31/20 0450  NA 138 137 135  K 3.8 3.4* 3.5  CL 99 99 98  CO2 29 29 28   GLUCOSE 79 75 78  BUN 48* 16 20  CREATININE 8.14* 4.05* 5.23*  CALCIUM 9.1 9.2 8.9  PHOS 4.6 2.3* 3.1   Liver Function Tests: Recent Labs  Lab 08/26/20 0432 08/26/20 1902 08/27/20 0423 08/28/20 0404 08/29/20 0120 08/29/20 1900 08/30/20 0413 08/30/20 1724 08/31/20 0450  AST 79*  --  58*  --  39  --   --   --   --   ALT 82*  --  48*  --  25  --   --   --   --   ALKPHOS 56  --  53  --  67  --   --   --   --   BILITOT 0.7  --  0.6  --  0.6  --   --   --   --   PROT 5.0*  --  4.9*  --  5.0*  --   --   --   --   ALBUMIN 2.3*   < > 2.1*   < > 2.0*   < > 2.3* 2.9* 2.7*   < > = values in this interval not displayed.   No results for input(s): LIPASE, AMYLASE in the last 168 hours. CBC: Recent Labs  Lab 08/25/20 1351 08/26/20 0432 08/27/20 0423 08/28/20 1029 08/29/20 0120 08/29/20 2030 08/30/20 1049 08/31/20 0450  WBC 16.0* 13.1* 10.4 11.8* 10.9*  --  10.5 9.2  NEUTROABS 12.6* 10.7*  --   --  8.4*  --   --   --   HGB 8.4* 8.1* 7.4* 8.7* 7.6* 9.1* 9.3* 9.2*  HCT 24.6* 24.0*  22.2* 26.8* 24.0* 27.7* 28.6* 28.0*  MCV 90.1 92.3 92.1 95.0 96.4  --  94.7 93.3  PLT 139* 155 163 244 233  --  302 293   CBG: Recent Labs  Lab 08/30/20 1708 08/30/20 2152 08/31/20 0629 08/31/20 0830 08/31/20 1128  GLUCAP 80 68* 75 81 91   Medications: . sodium chloride Stopped (08/25/20 2128)   . (feeding supplement) PROSource Plus  30 mL Oral BID BM  . sodium chloride   Intravenous Once  . sodium chloride   Intravenous Once  . calcitRIOL  0.25 mcg Oral Daily  . Chlorhexidine Gluconate Cloth  6 each Topical Q0600  . Chlorhexidine Gluconate Cloth  6 each Topical Q0600  . [START ON 09/07/2020] darbepoetin (ARANESP) injection - DIALYSIS  200 mcg Intravenous Q Wed-HD  .  feeding supplement (NEPRO CARB STEADY)  237 mL Oral TID BM  . haemophilus B polysaccharide conjugate vaccine  0.5 mL Intramuscular Once  . insulin aspart  0-6 Units Subcutaneous TID WC  . mouth rinse  15 mL Mouth Rinse BID  . meningococcal oligosaccharide  0.5 mL Intramuscular Once  . midodrine  5 mg Oral 3 times per day on Mon Wed Fri  . multivitamin  1 tablet Oral QHS  . pantoprazole  40 mg Oral BID  . pneumococcal 13-valent conjugate vaccine  0.5 mL Intramuscular Once  . sodium chloride flush  10-40 mL Intracatheter Q12H  . traZODone  150 mg Oral QHS    Dialysis Orders: Home hemodialysis ([facility]) MTTF, EDW 46.5kg - Aranesp last given 3/26.  Assessment/Plan: 1. Splenic rupture: S/p emergent splenectomy 3/29, then re-exploration/washout/closure 3/31. Surgery following.  2. Hemorrhagic shock/GI Bleed: s/p colonoscopy 3/28 - 2 polyps removed. Esophogram negative for esophageal injury/leak. Bedside 3/30 with grade I esophageal varices, small HH with healing mallory-weiss tear. No active bleeding.  3. ESRD: Previously on home hemo -> following MWF schedule here. Plan daily HD here to get volume down for now. Will need in-center HD for a time due to significant debility. SW aware.  4. HTN/volume: vol overload,  still up 8kg, got 2L off yest w/ lowest BP mid 80's. Home bp meds (norvasc/ labetalol/ hydralazine) are on hold and midodrine added. Last echo dec 2021 w/ EF 50% and mod severe MR.  5. Hx of UE venous stenosis - per notes has stents in R subclavian and L innominate veins; if they are occluded may be contributing to BUE edema. If not improving with HD, will need to consult VVS or IR for central venogram. 6. Anemia of ESRD + hemorrhage (above): s/p multiple transfusions. Due for esa. Got Aranesp 230mcg on 4/5.  7. Secondary HPTH: Phos low side, d/c'd Auryxia (can cause dark stools which can confound Hx GIB). CorrCa ok. Holding sensipar, remains on calcitriol daily. 8. Nutrition: Alb very low - continue supplements.   Kelly Splinter, MD 08/31/2020, 12:25 PM

## 2020-08-31 NOTE — Progress Notes (Addendum)
Nutrition Follow-up  DOCUMENTATION CODES:   Non-severe (moderate) malnutrition in context of chronic illness  INTERVENTION:   -Renal MVI daily -Continue 30 ml Prosource Plus BID, each supplement provides 100 kcals and 15 grams protein -Continue Nepro Shake po TID, each supplement provides 425 kcal and 19 grams protein  NUTRITION DIAGNOSIS:   Moderate Malnutrition related to chronic illness (ESRD on home HD, CHF) as evidenced by moderate fat depletion,severe muscle depletion.  Ongoing  GOAL:   Patient will meet greater than or equal to 90% of their needs  Progressing   MONITOR:   PO intake,Supplement acceptance,Diet advancement,Labs,Weight trends,Skin,I & O's  REASON FOR ASSESSMENT:   Ventilator    ASSESSMENT:   65 year old male who presented to the ED on 3/25 with chest pain and SOB. PMH of ESRD on home HD, CAD, CHF, PVD, adenocarcinoma of the bladder s/p TURBT, anemia, thrombocytopenia, PE, hepatitis C.  3/28 - colonoscopy (findings of polyp and diverticulosis, no active bleeding), intubated during procedure due to flash pulmonary edema, later extubated 3/29 - hemorrhagic shock secondary to splenic rupture, transferred to ICU, intubated, s/p ex-lap with splenectomy and application of abdominal wound VAC 3/30 - EGD revealing esophageal varices, small hiatal hernia, healing Mallory-Weiss tear, gastritis 3/31 - s/p ex-lap with washout and closure 4/01 - trickle feeds started at 10 ml/hr 4/02 - extubated 4/03 - diet advanced to clear liquids, new bag of TPN not hung due to pt's refusal 4/4- rectal tube d/c 4/5- s/p BSE- advanced to dysphagia 3 diet with thin liquids  Reviewed I/O's: -1.4 L x 24 hours and +13.5 L since admission  Drain output: 40 ml x 24 hours  Pt unavailable at time of visit. Attempted to speak with pt via call to hospital room phone, however, no answer.   Pt was just advanced to a dysphagia 3 diet yesterday. Per chart review, pt was very eager to  eat solid foods. No meal completions currently documented. Per MAR, pt is taking Nepro and Prosource supplements.   Per nephrology notes, last HD was yesterday (08/31/20). EDW 46.5 kg.   Medications reviewed.   Labs reviewed: CBGS: 68-81 (inpatient orders for glycemic control are 0-6 units insulin aspart TID with meals).   Diet Order:   Diet Order            DIET DYS 3 Room service appropriate? Yes; Fluid consistency: Thin  Diet effective now                 EDUCATION NEEDS:   Education needs have been addressed  Skin:  Skin Assessment: Skin Integrity Issues: Skin Integrity Issues:: Stage II Stage II: right buttock Wound Vac: - Incisions: abdomen  Last BM:  08/30/20  Height:   Ht Readings from Last 1 Encounters:  08/30/20 5\' 4"  (1.626 m)    Weight:   Wt Readings from Last 1 Encounters:  08/31/20 53.9 kg   BMI:  Body mass index is 20.41 kg/m.  Estimated Nutritional Needs:   Kcal:  1700-1900  Protein:  85-100 grams  Fluid:  1000 ml + UOP    Loistine Chance, RD, LDN, CDCES Registered Dietitian II Certified Diabetes Care and Education Specialist Please refer to [facility] for RD and/or RD on-call/weekend/after hours pager

## 2020-08-31 NOTE — Progress Notes (Signed)
Renal Navigator discussed patient with Dr. Sherryl Barters Therapy Medical Director, who feels patient needs to return to in-center HD for a period of time at discharge due to weakness. Per Renal PA/K. Stovall, patient is in full agreement with this and knows he is too weak to perform HHD at this time and wishes to be set up at [facility]. Dr. Joelyn Oms is in agreement with this, where he can monitor for patient's return to [place].  Navigator sent request for seat schedule to [facility]. Navigator will provide update on discharge to clinic when this is known and follow up with patient.   Alphonzo Cruise, [place] Renal Navigator [phone]

## 2020-08-31 NOTE — Progress Notes (Signed)
[facility]                                                                              Patient Demographics  Logan Martin, is a 65 y.o. male, DOB - [birth year], JSH:[id number]  Admit date - 08/19/2020   Admitting Physician Doran Heater, DO  Outpatient Primary MD for the patient is [name], NP  Outpatient specialists:   LOS - 11  days   Medical records reviewed and are as summarized below:    Chief Complaint  Patient presents with  . Chest Pain       Brief summary   Patient is a 65 year old male with history of ESRD on HD MWF, hypertension, hyperlipidemia, CAD, history of adenocarcinoma of the bladder, anemia, thrombocytopenia, history of PE, hepatitis C admitted on 3/25 with progressive fatigue, generalized weakness, melanotic stools for a week.  CT abdomen and pelvis showed no acute source of bleeding, focal areas of high-grade stenosis within distal branches of SMA in the right lower quadrant. Nephrology and GI were consulted.  Patient underwent a colonoscopy on 3/28 which showed diverticulosis, 2 polyps were removed, sent for biopsy, no evidence of GI bleed. Patient has required ongoing transfusions throughout the hospitalization for anemia.  On 3/29, patient acutely complained of worsening abdominal pain, chronic back pain.  He was found to have splenic rupture, hemorrhagic shock requiring massive transfusion, underwent emergent exploratory laparotomy, splenectomy, wound VAC.  Underwent reexploration, ex lap, washout and closure on 3/31 by Dr. Redmond Pulling. Patient was transferred out to the floor by PCCM, [facility] assumed care on 08/31/2020  Significant Hospital Events: Including procedures, antibiotic start and stop dates in addition to other pertinent events    3/25 admitted to [facility], nephrology following   3/26 L IJ DL CVL placed by IR (of note, unable to pass via right IJ due to occlusion and lower right IJ at venous stent)  3/27 [facility]  consulted  3/28 colonoscopy by Dr. Lyndel Safe with two polyps removed, required intubation during procedure for flash pulmonary edema; extubated post procedure  3/29 transfused 4th unit PRBC this am.  During afternoon, acute decompensation/ hypotension on floor -> RRT, vasopresors started, stat CT abd/ pelvis and surgical consult -> intubated, then to OR with ruptured spleen s/p MTP and splenectomy  3/30 EGD 3/30 - healing Mallory-Weiss tear, grade 1 esophageal varices, small hiatal hernia  3/31 OR >> washout & abd closure  4/1off vasopressin and Levophed , hemodialysis, started tube feeds  4/2: Patient was extubated  4/6 :  [facility] assumed care  Assessment & Plan    Hemorrhagic shock secondary to splenic rupture and hemoperitoneum, acute blood loss anemia -Status post ex lap and splenectomy on 3/29, reexploration, ex lap, washout and closure on 3/31 (Dr. Redmond Pulling) -Patient received 9 packed RBCs, 6 FFP, 1 platelet 1 cryo, transfuse for hemoglobin less than 7 -General surgery following, tolerating dysphagia 3 diet, continue PPI -Per surgery, no antibiotics needed from abdominal standpoint, wound healing well. -Received Zosyn for 5 days. -Shock physiology resolved, BP stable  Acute hypoxic respiratory failure -Patient was intubated on 3/28, required intubation during coloscopy procedure for flash pulmonary  edema, was extubated on 4/2 -Currently doing well, O2 sats 97% on room air   ESRD on hemodialysis MWF -Nephrology following, undergoing hemodialysis per his schedule -Continue Nepro multivitamin, calcitriol, Aranesp  Essential hypertension, hyperlipidemia, history of CAD -Currently no acute issues, BP stable now.  Hypotension resolved. -Continue to hold amlodipine, aspirin, Imdur, labetalol, hydralazine - will resume gradually -Continue labetalol as needed with parameters  GERD -Underwent EGD, colonoscopy this hospitalization, as above -Continue PPI   Delirium -Likely due to  ICU stay and acute illness, currently alert and oriented, appears to be close to his baseline  Dysphagia - continue dysphagia 3 diet with thin liquids, advance diet as tolerated   Pressure injury, right buttock stage II -Continue wound care with nursing, not present on admission  Generalized debility -PT evaluation recommended home health PT  History of adenocarcinoma of the bladder status post TURT in 02/2018, by Dr. Lovena Neighbours History of cystoprostatectomy and resection bladder augmentation, enterolysis in 07/2018 at [facility] -Outpatient follow-up with urology   Moderate protein calorie malnutrition secondary to chronic illness, ESRD on HD, acute illness -Dietitian following, continue nutritional supplements, dysphagia 3 diet with thin liquids, advance as tolerated   Code Status: Full CODE STATUS DVT Prophylaxis:  SCDs Start: 08/20/20 0101   Level of Care: Level of care: Progressive Family Communication: Discussed all imaging results, lab results, explained to the patient    Disposition Plan:     Status is: Inpatient  Remains inpatient appropriate because:Inpatient level of care appropriate due to severity of illness   Dispo: The patient is from: Home              Anticipated d/c is to: Home              Patient currently is not medically stable to d/c.  Will need to be cleared by surgery before patient can be discharged home safely   Difficult to place patient No      Time Spent in minutes   *35 minutes  Procedures:  As above   Consultants:   Gastroenterology General surgery Critical care IR  Antimicrobials:   Anti-infectives (From admission, onward)   Start     Dose/Rate Route Frequency Ordered Stop   08/24/20 1200  vancomycin (VANCOREADY) IVPB 500 mg/100 mL  Status:  Discontinued        500 mg 100 mL/hr over 60 Minutes Intravenous Every M-W-F (Hemodialysis) 08/23/20 1724 08/23/20 2039   08/24/20 1200  vancomycin (VANCOREADY) IVPB 500 mg/100 mL  Status:   Discontinued        500 mg 100 mL/hr over 60 Minutes Intravenous Every M-W-F (Hemodialysis) 08/23/20 2052 08/26/20 0826   08/23/20 2145  vancomycin (VANCOREADY) IVPB 1000 mg/200 mL  Status:  Discontinued        1,000 mg 200 mL/hr over 60 Minutes Intravenous  Once 08/23/20 2053 08/23/20 2055   08/23/20 2100  piperacillin-tazobactam (ZOSYN) IVPB 2.25 g  Status:  Discontinued        2.25 g 100 mL/hr over 30 Minutes Intravenous Every 8 hours 08/23/20 2052 08/27/20 1254   08/23/20 1730  vancomycin (VANCOREADY) IVPB 1000 mg/200 mL  Status:  Discontinued        1,000 mg 200 mL/hr over 60 Minutes Intravenous  Once 08/23/20 1723 08/23/20 2101   08/23/20 1730  piperacillin-tazobactam (ZOSYN) IVPB 2.25 g  Status:  Discontinued        2.25 g 100 mL/hr over 30 Minutes Intravenous Every 8 hours 08/23/20 1726 08/23/20 2039  08/23/20 1430  piperacillin-tazobactam (ZOSYN) IVPB 3.375 g  Status:  Discontinued        3.375 g 12.5 mL/hr over 240 Minutes Intravenous Every 12 hours 08/23/20 1427 08/23/20 1726   08/20/20 0415  amoxicillin-clavulanate (AUGMENTIN) 500-125 MG per tablet 500 mg  Status:  Discontinued        1 tablet Oral Daily 08/20/20 0329 08/21/20 1258   08/19/20 2345  Ampicillin-Sulbactam (UNASYN) 3 g in sodium chloride 0.9 % 100 mL IVPB  Status:  Discontinued        3 g 200 mL/hr over 30 Minutes Intravenous  Once 08/19/20 2333 08/20/20 0329          Medications  Scheduled Meds: . (feeding supplement) PROSource Plus  30 mL Oral BID BM  . sodium chloride   Intravenous Once  . sodium chloride   Intravenous Once  . calcitRIOL  0.25 mcg Oral Daily  . Chlorhexidine Gluconate Cloth  6 each Topical Q0600  . Chlorhexidine Gluconate Cloth  6 each Topical Q0600  . [START ON 09/07/2020] darbepoetin (ARANESP) injection - DIALYSIS  200 mcg Intravenous Q Wed-HD  . feeding supplement (NEPRO CARB STEADY)  237 mL Oral TID BM  . insulin aspart  0-6 Units Subcutaneous TID WC  . mouth rinse  15 mL  Mouth Rinse BID  . midodrine  5 mg Oral 3 times per day on Mon Wed Fri  . multivitamin  1 tablet Oral QHS  . pantoprazole  40 mg Oral BID  . sodium chloride flush  10-40 mL Intracatheter Q12H  . traZODone  150 mg Oral QHS   Continuous Infusions: . sodium chloride Stopped (08/25/20 2128)   PRN Meds:.sodium chloride, acetaminophen, heparin, labetalol, lidocaine, ondansetron **OR** ondansetron (ZOFRAN) IV, oxyCODONE, simethicone, sodium chloride flush      Subjective:   Ebrahim Deremer was seen and examined today.  No complaints this morning, did not sleep well last night.  No fevers or chills.  No nausea or vomiting. Patient denies dizziness, chest pain, shortness of breath,  new weakness, numbess, tingling. No acute events overnight.    Objective:   Vitals:   08/30/20 2037 08/31/20 0008 08/31/20 0423 08/31/20 0827  BP: 130/66 (!) 107/54 101/62 138/60  Pulse: 91 87 92 96  Resp: 16 20 18 18   Temp: 98 F (36.7 C) 98.5 F (36.9 C) 97.7 F (36.5 C) 98.7 F (37.1 C)  TempSrc: Oral Oral Oral Oral  SpO2: 93% 96% 100% 91%  Weight:   53.9 kg   Height:        Intake/Output Summary (Last 24 hours) at 08/31/2020 1050 Last data filed at 08/31/2020 0545 Gross per 24 hour  Intake 610 ml  Output 2040 ml  Net -1430 ml     Wt Readings from Last 3 Encounters:  08/31/20 53.9 kg  05/07/20 45.1 kg  03/29/20 50.2 kg     Exam  General: Alert and oriented x 3, NAD  Cardiovascular: S1 S2 auscultated, RRR  Respiratory: Clear to auscultation bilaterally, no wheezing  Gastrointestinal: Soft, dressing intact + bowel sounds, JP drain  Ext: no pedal edema bilaterally  Neuro: moving all 4 extremities spontaneously, nonfocal  Musculoskeletal: No digital cyanosis, clubbing  Skin: No rashes  Psych: Normal affect and demeanor, alert and oriented x3    Data Reviewed:  I have personally reviewed following labs and imaging studies  Micro Results Recent Results (from the past 240 hour(s))   MRSA PCR Screening     Status: None  Collection Time: 08/26/20  9:29 AM   Specimen: Nasal Mucosa; Nasopharyngeal  Result Value Ref Range Status   MRSA by PCR NEGATIVE NEGATIVE Final    Comment:        The GeneXpert MRSA Assay (FDA approved for NASAL specimens only), is one component of a comprehensive MRSA colonization surveillance program. It is not intended to diagnose MRSA infection nor to guide or monitor treatment for MRSA infections. Performed at [facility], [address], [place], [place] [address]     Radiology Reports DG Chest 2 View  Result Date: 08/19/2020 CLINICAL DATA:  Chest pain shortness of breath EXAM: CHEST - 2 VIEW COMPARISON:  Chest radiograph May 04, 2020 FINDINGS: Right subclavian/innominate stent, unchanged. Mild cardiac enlargement. Central vascular congestion. Mild basilar predominant interstitial opacities. The visualized skeletal structures are unremarkable. IMPRESSION: Mild cardiac enlargement with central vascular congestion and basilar predominant interstitial opacities, likely mild edema. Electronically Signed   By: Dahlia Bailiff MD   On: 08/19/2020 20:18   DG Abd 1 View  Result Date: 08/24/2020 CLINICAL DATA:  OG tube EXAM: ABDOMEN - 1 VIEW COMPARISON:  08/23/2020 FINDINGS: Esophageal tube tip and side port overlie the proximal to mid stomach. Radiopaque presumed packing material in left upper quadrant as before. Residual enteral contrast within the colon. Nonobstructed bowel-gas pattern. Consolidation at the left base with probable small effusion. IMPRESSION: Esophageal tube tip and side port overlie the proximal to mid stomach. Nonobstructed gas pattern. Electronically Signed   By: Donavan Foil M.D.   On: 08/24/2020 19:13   CT CHEST ABDOMEN PELVIS W CONTRAST  Result Date: 08/23/2020 CLINICAL DATA:  Inpatient with acute generalized abdominal pain. End-stage renal disease on dialysis for over 6 months. EXAM: CT CHEST, ABDOMEN, AND PELVIS  WITH CONTRAST TECHNIQUE: Multidetector CT imaging of the chest, abdomen and pelvis was performed following the standard protocol during bolus administration of intravenous contrast. CONTRAST:  157mL OMNIPAQUE IOHEXOL 300 MG/ML  SOLN COMPARISON:  08/20/2020 CT angiogram of the abdomen and pelvis. 06/17/2018 chest CT angiogram. FINDINGS: CT CHEST FINDINGS Cardiovascular: Normal heart size. Small to moderate pericardial effusion. Three-vessel coronary atherosclerosis. Left internal jugular central venous catheter terminates at the cavoatrial junction. Venous stent in place in right subclavian and right brachiocephalic veins. Atherosclerotic nonaneurysmal thoracic aorta. Top-normal caliber main pulmonary artery (3.3 cm diameter). No central pulmonary emboli. Mediastinum/Nodes: No discrete thyroid nodules. Patulous thoracic esophagus with layering oral contrast. No pathologically enlarged axillary, mediastinal or hilar lymph nodes. Lungs/Pleura: No pneumothorax. No right pleural effusion. Trace dependent left pleural effusion. Moderate centrilobular emphysema with mild diffuse bronchial wall thickening. A few scattered tiny solid right pulmonary nodules, largest 2 mm in peripheral right upper lobe (series 5/image 71), not appreciably changed since 11/13/2017 chest CT, considered benign. No acute consolidative airspace disease, lung masses or new significant pulmonary nodules. Musculoskeletal: No aggressive appearing focal osseous lesions. Mild diffuse sclerosis in the thoracic skeleton. Mild bilateral gynecomastia, asymmetric to the right. CT ABDOMEN PELVIS FINDINGS Hepatobiliary: Normal liver size. Several scattered simple liver cysts largest 2.4 cm in the inferior right liver. Innumerable subcentimeter hypodense lesions scattered throughout the liver, too small to characterize, not appreciably changed. Vicarious excretion of contrast in the nondistended gallbladder. No gallbladder wall thickening. No biliary ductal  dilatation. Pancreas: Normal, with no mass or duct dilation. Spleen: There is an acute subcapsular splenic hematoma measuring 2.0 cm thickness (series 3/image 63). Overall normal spleen size. No discrete splenic mass. Adrenals/Urinary Tract: Normal adrenals. Severe atrophy of the  kidneys bilaterally, unchanged. Chronic moderate right hydroureteronephrosis is unchanged. No left hydronephrosis. Numerous simple bilateral renal cysts, largest 2.2 cm in the lower right kidney. Numerous subcentimeter hypodense renal cortical lesions in both kidneys, too small to characterize, not appreciably changed. Bladder appears decompressed. Stomach/Bowel: Small hiatal hernia. Stomach is otherwise nondistended and normal. Normal caliber small bowel with no small bowel wall thickening. Oral contrast transits to the right colon. Appendix not discretely visualized. Normal large bowel with no diverticulosis, large bowel wall thickening or pericolonic fat stranding. Vascular/Lymphatic: Atherosclerotic nonaneurysmal abdominal aorta. Patent portal, splenic and diminutive renal veins. No pathologically enlarged lymph nodes in the abdomen or pelvis. Reproductive: Multiple surgical clips from apparent prostatectomy. Other: Large volume of hemoperitoneum, new since scan from 3 days prior. No pneumoperitoneum. Musculoskeletal: No aggressive appearing focal osseous lesions. Diffuse sclerosis in the lumbar spine and bilateral pelvic girdle, unchanged. Mild lumbar spondylosis. IMPRESSION: 1. Findings suggestive of acute splenic rupture with acute large volume hemoperitoneum and moderate subcapsular splenic hematoma, all new since CT scan from 3 days prior. 2. Small to moderate pericardial effusion. Trace dependent left pleural effusion. 3. Three-vessel coronary atherosclerosis. 4. Small hiatal hernia. Patulous thoracic esophagus with layering oral contrast, suggesting esophageal dysmotility and/or gastroesophageal reflux. 5. Chronic moderate right  hydroureteronephrosis. Severe atrophy of the kidneys bilaterally. 6. Renal osteodystrophy. 7. Aortic Atherosclerosis (ICD10-I70.0) and Emphysema (ICD10-J43.9). Critical Value/emergent results were called by telephone at the time of interpretation on 08/23/2020 at 3:20 pm to provider DAWOOD [employer] , who verbally acknowledged these results. Electronically Signed   By: Ilona Sorrel M.D.   On: 08/23/2020 15:23   IR Veno/Jugular Right  Result Date: 08/20/2020 INDICATION: 66 year old with end-stage renal disease, hematochezia, anemia and poor venous access. Patient needs a central venous catheter. EXAM: PLACEMENT OF CENTRAL LINE WITH ULTRASOUND AND FLUOROSCOPIC GUIDANCE CENTRAL VENOGRAM COMPARISON:  None. MEDICATIONS: None ANESTHESIA/SEDATION: None FLUOROSCOPY TIME:  3 minutes, 48 seconds, 60.1 mGy) COMPLICATIONS: None immediate. PROCEDURE: The procedure, risks, benefits, and alternatives were explained to the patient. Questions regarding the procedure were encouraged and answered. The patient understands and consents to the procedure. Right side of the neck was evaluated with ultrasound. Ultrasound confirmed a patent right internal jugular vein. Ultrasound image was saved for documentation. Right side of the neck was prepped and draped in sterile fashion. Maximal barrier sterile technique was utilized including caps, mask, sterile gowns, sterile gloves, sterile drape, hand hygiene and skin antiseptic. Skin was anesthetized with 1% lidocaine. 21 gauge needle was directed into the right internal jugular vein but a wire would not advance centrally. Micropuncture dilator set was placed. Contrast was injected for a venogram. 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 Microcatheter was removed with manual compression. Dressing was placed. Ultrasound confirmed a patent left internal jugular vein. Ultrasound image was saved for documentation. Left side of the neck was prepped and draped in  sterile fashion. Maximal barrier sterile technique was utilized including caps, mask, sterile gowns, sterile gloves, sterile drape, hand hygiene and skin antiseptic. Skin was anesthetized using 1% lidocaine. Small incision was made. 21 gauge needle was directed into the left internal jugular vein with ultrasound guidance. Micropuncture dilator set was placed. Wire would not easily advance into the SVC due to the right innominate stent. Venogram was performed to confirm patency. Kumpe catheter and Glidewire were used to cannulate the SVC from the left jugular approach. A Platinum Plus wire was placed and the catheter was removed. Peel-away sheath was placed. A dual lumen  Power PICC line was cut to 23 cm and advanced over the wire into the SVC. Wire was removed. Both lumens aspirated and flushed well. Catheter was sutured to the skin. Dressing was placed. 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 Wire would not advance into the SVC from right jugular approach. 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 Central venogram from the left jugular approach confirmed patency of the left innominate vein but there is significant narrowing at the junction of the left innominate vein and SVC related to the right innominate vein stent. Eventually a Kumpe catheter and Glidewire were able to cannulate the SVC from a left jugular approach. The central venous catheter was positioned near the superior cavoatrial junction. IMPRESSION: Successful placement of a central venous catheter using ultrasound and fluoroscopic guidance. Catheter was placed from a left jugular approach. 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 Right jugular vein is patent in the neck but the lower portion of the right jugular vein is occluded from the right  innominate vein stent. Patient is not a candidate for future right jugular central venous access. Electronically Signed   By: Markus Daft M.D.   On: 08/20/2020 15:15   IR Fluoro Guide CV Line Left  Result Date: 08/20/2020 INDICATION: 64 year old with end-stage renal disease, hematochezia, anemia and poor venous access. Patient needs a central venous catheter. EXAM: PLACEMENT OF CENTRAL LINE WITH ULTRASOUND AND FLUOROSCOPIC GUIDANCE CENTRAL VENOGRAM COMPARISON:  None. MEDICATIONS: None ANESTHESIA/SEDATION: None FLUOROSCOPY TIME:  3 minutes, 48 seconds, 23.5 mGy) COMPLICATIONS: None immediate. PROCEDURE: The procedure, risks, benefits, and alternatives were explained to the patient. Questions regarding the procedure were encouraged and answered. The patient understands and consents to the procedure. Right side of the neck was evaluated with ultrasound. Ultrasound confirmed a patent right internal jugular vein. Ultrasound image was saved for documentation. Right side of the neck was prepped and draped in sterile fashion. Maximal barrier sterile technique was utilized including caps, mask, sterile gowns, sterile gloves, sterile drape, hand hygiene and skin antiseptic. Skin was anesthetized with 1% lidocaine. 21 gauge needle was directed into the right internal jugular vein but a wire would not advance centrally. Micropuncture dilator set was placed. Contrast was injected for a venogram. 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 Microcatheter was removed with manual compression. Dressing was placed. Ultrasound confirmed a patent left internal jugular vein. Ultrasound image was saved for documentation. Left side of the neck was prepped and draped in sterile fashion. Maximal barrier sterile technique was utilized including caps, mask, sterile gowns, sterile gloves, sterile drape, hand hygiene and skin antiseptic. Skin was anesthetized using 1% lidocaine. Small incision was made. 21 gauge  needle was directed into the left internal jugular vein with ultrasound guidance. Micropuncture dilator set was placed. Wire would not easily advance into the SVC due to the right innominate stent. Venogram was performed to confirm patency. Kumpe catheter and Glidewire were used to cannulate the SVC from the left jugular approach. A Platinum Plus wire was placed and the catheter was removed. Peel-away sheath was placed. A dual lumen Power PICC line was cut to 23 cm and advanced over the wire into the SVC. Wire was removed. Both lumens aspirated and flushed well. Catheter was sutured to the skin. Dressing was placed. FINDINGS: Patient has  a stent in the right innominate vein. Wire would not advance into the SVC from right jugular approach. 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 Central venogram from the left jugular approach confirmed patency of the left innominate vein but there is significant narrowing at the junction of the left innominate vein and SVC related to the right innominate vein stent. Eventually a Kumpe catheter and Glidewire were able to cannulate the SVC from a left jugular approach. The central venous catheter was positioned near the superior cavoatrial junction. IMPRESSION: Successful placement of a central venous catheter using ultrasound and fluoroscopic guidance. Catheter was placed from a left jugular approach. 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 Right jugular vein is patent in the neck but the lower portion of the right jugular vein is occluded from the right innominate vein stent. Patient is not a candidate for future right jugular central venous access. Electronically Signed   By: Markus Daft M.D.   On: 08/20/2020 15:15   IR US Guide Vasc Access Left  Result Date: 08/20/2020 INDICATION:  65 year old with end-stage renal disease, hematochezia, anemia and poor venous access. Patient needs a central venous catheter. EXAM: PLACEMENT OF CENTRAL LINE WITH ULTRASOUND AND FLUOROSCOPIC GUIDANCE CENTRAL VENOGRAM COMPARISON:  None. MEDICATIONS: None ANESTHESIA/SEDATION: None FLUOROSCOPY TIME:  3 minutes, 48 seconds, 84.6 mGy) COMPLICATIONS: None immediate. PROCEDURE: The procedure, risks, benefits, and alternatives were explained to the patient. Questions regarding the procedure were encouraged and answered. The patient understands and consents to the procedure. Right side of the neck was evaluated with ultrasound. Ultrasound confirmed a patent right internal jugular vein. Ultrasound image was saved for documentation. Right side of the neck was prepped and draped in sterile fashion. Maximal barrier sterile technique was utilized including caps, mask, sterile gowns, sterile gloves, sterile drape, hand hygiene and skin antiseptic. Skin was anesthetized with 1% lidocaine. 21 gauge needle was directed into the right internal jugular vein but a wire would not advance centrally. Micropuncture dilator set was placed. Contrast was injected for a venogram. 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 Microcatheter was removed with manual compression. Dressing was placed. Ultrasound confirmed a patent left internal jugular vein. Ultrasound image was saved for documentation. Left side of the neck was prepped and draped in sterile fashion. Maximal barrier sterile technique was utilized including caps, mask, sterile gowns, sterile gloves, sterile drape, hand hygiene and skin antiseptic. Skin was anesthetized using 1% lidocaine. Small incision was made. 21 gauge needle was directed into the left internal jugular vein with ultrasound guidance. Micropuncture dilator set was placed. Wire would not easily advance into the SVC due to the right innominate stent. Venogram was performed to confirm  patency. Kumpe catheter and Glidewire were used to cannulate the SVC from the left jugular approach. A Platinum Plus wire was placed and the catheter was removed. Peel-away sheath was placed. A dual lumen Power PICC line was cut to 23 cm and advanced over the wire into the SVC. Wire was removed. Both lumens aspirated and flushed well. Catheter was sutured to the skin. Dressing was placed. 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 Wire would not advance into the SVC from right jugular approach. Ultrasound demonstrated occlusion of the lower right internal jugular vein with collateral flow in the right shoulder region and  near the midline. Central venogram from the left jugular approach confirmed patency of the left innominate vein but there is significant narrowing at the junction of the left innominate vein and SVC related to the right innominate vein stent. Eventually a Kumpe catheter and Glidewire were able to cannulate the SVC from a left jugular approach. The central venous catheter was positioned near the superior cavoatrial junction. IMPRESSION: Successful placement of a central venous catheter using ultrasound and fluoroscopic guidance. Catheter was placed from a left jugular approach. 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 Right jugular vein is patent in the neck but the lower portion of the right jugular vein is occluded from the right innominate vein stent. Patient is not a candidate for future right jugular central venous access. Electronically Signed   By: Markus Daft M.D.   On: 08/20/2020 15:15   IR US Guide Vasc Access Right  Result Date: 08/20/2020 INDICATION: 65 year old with end-stage renal disease, hematochezia, anemia and poor venous access. Patient needs a central venous catheter. EXAM: PLACEMENT OF CENTRAL LINE WITH ULTRASOUND AND FLUOROSCOPIC GUIDANCE CENTRAL VENOGRAM COMPARISON:  None.  MEDICATIONS: None ANESTHESIA/SEDATION: None FLUOROSCOPY TIME:  3 minutes, 48 seconds, 12.7 mGy) COMPLICATIONS: None immediate. PROCEDURE: The procedure, risks, benefits, and alternatives were explained to the patient. Questions regarding the procedure were encouraged and answered. The patient understands and consents to the procedure. Right side of the neck was evaluated with ultrasound. Ultrasound confirmed a patent right internal jugular vein. Ultrasound image was saved for documentation. Right side of the neck was prepped and draped in sterile fashion. Maximal barrier sterile technique was utilized including caps, mask, sterile gowns, sterile gloves, sterile drape, hand hygiene and skin antiseptic. Skin was anesthetized with 1% lidocaine. 21 gauge needle was directed into the right internal jugular vein but a wire would not advance centrally. Micropuncture dilator set was placed. Contrast was injected for a venogram. 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 Microcatheter was removed with manual compression. Dressing was placed. Ultrasound confirmed a patent left internal jugular vein. Ultrasound image was saved for documentation. Left side of the neck was prepped and draped in sterile fashion. Maximal barrier sterile technique was utilized including caps, mask, sterile gowns, sterile gloves, sterile drape, hand hygiene and skin antiseptic. Skin was anesthetized using 1% lidocaine. Small incision was made. 21 gauge needle was directed into the left internal jugular vein with ultrasound guidance. Micropuncture dilator set was placed. Wire would not easily advance into the SVC due to the right innominate stent. Venogram was performed to confirm patency. Kumpe catheter and Glidewire were used to cannulate the SVC from the left jugular approach. A Platinum Plus wire was placed and the catheter was removed. Peel-away sheath was placed. A dual lumen Power PICC line was cut to 23 cm and  advanced over the wire into the SVC. Wire was removed. Both lumens aspirated and flushed well. Catheter was sutured to the skin. Dressing was placed. 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 Wire would not advance into the SVC from right jugular approach. 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 Central venogram from the left jugular approach confirmed patency of the left innominate vein but there is significant narrowing at the junction of the left innominate vein and SVC related to the right innominate  vein stent. Eventually a Kumpe catheter and Glidewire were able to cannulate the SVC from a left jugular approach. The central venous catheter was positioned near the superior cavoatrial junction. IMPRESSION: Successful placement of a central venous catheter using ultrasound and fluoroscopic guidance. Catheter was placed from a left jugular approach. 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 Right jugular vein is patent in the neck but the lower portion of the right jugular vein is occluded from the right innominate vein stent. Patient is not a candidate for future right jugular central venous access. Electronically Signed   By: Markus Daft M.D.   On: 08/20/2020 15:15   DG Chest Port 1 View  Result Date: 08/26/2020 CLINICAL DATA:  Respiratory failure EXAM: PORTABLE CHEST 1 VIEW COMPARISON:  08/25/2020 FINDINGS: Endotracheal tube seen 3.8 cm above the carina. Nasogastric tube tip within the gastric fundus. Left internal jugular central venous catheter tip within the superior cavoatrial junction. Vascular stents noted within the right subclavian and right innominate veins. Persistent retrocardiac opacification and left-sided volume loss in keeping with left lower lobe collapse. The lungs are otherwise clear. No  pneumothorax. Tiny left pleural effusion may be present. Cardiac size is within normal limits. IMPRESSION: Stable support lines and tubes. Persistent left lower lobe collapse. Possible tiny left pleural effusion. Electronically Signed   By: Fidela Salisbury MD   On: 08/26/2020 06:38   DG Chest Port 1 View  Result Date: 08/25/2020 CLINICAL DATA:  Respiratory failure EXAM: PORTABLE CHEST 1 VIEW COMPARISON:  08/24/2020 FINDINGS: Endotracheal tube is seen 5.7 cm above the carina. Nasogastric tube extends into the upper abdomen. Left internal jugular central venous catheter tip noted within the superior cavoatrial junction. Vascular stents again noted within the expected right subclavian and innominate veins. Progressive left lower lobe collapse with increasing left-sided volume loss. Lungs are otherwise clear. No pneumothorax or pleural effusion. Cardiac size within normal limits. No acute bone abnormality. IMPRESSION: Stable support lines and tubes. Progressive left lower lobe collapse now with mild left-sided volume loss. Electronically Signed   By: Fidela Salisbury MD   On: 08/25/2020 05:02   DG Chest Port 1 View  Result Date: 08/24/2020 CLINICAL DATA:  Acute respiratory failure EXAM: PORTABLE CHEST 1 VIEW COMPARISON:  08/23/2020 FINDINGS: Endotracheal tube seen 5 cm above the carina. Nasogastric tube extends into the upper abdomen. Left internal jugular central venous catheter tip noted within the right atrium. Vascular stents are seen within the expected right subclavian and innominate vein. The lungs are symmetrically well expanded. Developing retrocardiac opacity may represent partial left lower lobe collapse. Tiny left pleural effusion. No pneumothorax. Cardiac size is within normal limits. Surgical clips are seen in the left axilla. IMPRESSION: Stable support lines and tubes. Partial left lower lobe collapse.  Tiny left pleural effusion. Electronically Signed   By: Fidela Salisbury MD   On: 08/24/2020 06:02    DG CHEST PORT 1 VIEW  Result Date: 08/23/2020 CLINICAL DATA:  Check endotracheal tube placement EXAM: PORTABLE CHEST 1 VIEW COMPARISON:  08/23/2020 FINDINGS: Left jugular central line is again noted with the catheter tip at the cavoatrial junction. Right subclavian and innominate vein stenting is seen. Endotracheal tube and gastric catheter are noted. The lungs are clear. Cardiac shadow is within normal limits. IMPRESSION: Tubes and lines as described above. No acute intrathoracic abnormality noted. Electronically Signed   By: Inez Catalina M.D.   On: 08/23/2020  19:10   DG Chest Port 1 View  Result Date: 08/23/2020 CLINICAL DATA:  Shortness of breath. EXAM: PORTABLE CHEST 1 VIEW COMPARISON:  Single-view of the chest 08/22/2020. FINDINGS: Left IJ central venous catheter and vascular stent along the right subclavian vessels are unchanged. Lungs are clear. No pneumothorax or pleural effusion. Heart size is normal. Aortic atherosclerosis noted. IMPRESSION: No acute disease. Aortic Atherosclerosis (ICD10-I70.0). Electronically Signed   By: Inge Rise M.D.   On: 08/23/2020 14:26   DG Chest Port 1 View  Result Date: 08/22/2020 CLINICAL DATA:  Chest EXAM: PORTABLE CHEST 1 VIEW COMPARISON:  Earlier same day FINDINGS: No new consolidation or edema. No pleural effusion. No pneumothorax. Stable cardiomediastinal contours with cardiomegaly. Right vascular stent. Left IJ central line tip overlies cavoatrial junction. IMPRESSION: No acute process in the chest. Electronically Signed   By: Macy Mis M.D.   On: 08/22/2020 15:07   DG CHEST PORT 1 VIEW  Result Date: 08/22/2020 CLINICAL DATA:  Hypoxia EXAM: PORTABLE CHEST 1 VIEW COMPARISON:  August 19, 2020 FINDINGS: Endotracheal tube tip is 3.4 cm above the carina. Central catheter tip is at the cavoatrial junction. No pneumothorax. The lungs are clear. Heart is mildly enlarged with pulmonary vascularity normal. No adenopathy. There is a right  innominate/subclavian region stent, unchanged in position. IMPRESSION: Tube and catheter positions as described without pneumothorax. No edema or airspace opacity. Stable cardiac prominence. Electronically Signed   By: Lowella Grip III M.D.   On: 08/22/2020 11:03   DG Abd Portable 1V  Result Date: 08/27/2020 CLINICAL DATA:  Nausea. EXAM: PORTABLE ABDOMEN - 1 VIEW COMPARISON:  08/24/2020 FINDINGS: Bowel gas pattern is nonobstructive with contrast throughout the colon. Surgical drain is present over the left upper quadrant. No free peritoneal air. Remainder of the exam is unchanged. IMPRESSION: Nonobstructive bowel gas pattern. Electronically Signed   By: Marin Olp M.D.   On: 08/27/2020 14:41   DG Abd Portable 1V  Result Date: 08/23/2020 CLINICAL DATA:  Abdominal pain. EXAM: PORTABLE ABDOMEN - 1 VIEW COMPARISON:  March 10, 2012. FINDINGS: The bowel gas pattern is normal. Residual contrast is seen throughout the small bowel. Surgical staples are noted in the pelvis. No radio-opaque calculi or other significant radiographic abnormality are seen. IMPRESSION: No evidence of bowel obstruction or ileus. Electronically Signed   By: Marijo Conception M.D.   On: 08/23/2020 14:26   CT Angio Abd/Pel w/ and/or w/o  Result Date: 08/21/2020 CLINICAL DATA:  Melena, gastrointestinal bleeding EXAM: CTA ABDOMEN AND PELVIS WITHOUT AND WITH CONTRAST TECHNIQUE: Multidetector CT imaging of the abdomen and pelvis was performed using the standard protocol during bolus administration of intravenous contrast. Multiplanar reconstructed images and MIPs were obtained and reviewed to evaluate the vascular anatomy. CONTRAST:  158mL OMNIPAQUE IOHEXOL 350 MG/ML SOLN COMPARISON:  03/27/2018 FINDINGS: VASCULAR Aorta: Normal caliber aorta without aneurysm, dissection, vasculitis or significant stenosis. Diffuse atherosclerosis. Celiac: Patent without evidence of aneurysm, dissection, vasculitis or significant stenosis. SMA: The SMA  is patent at its origin without focal stenosis, aneurysm, or dissection. Distal branches of the SMA supplying the bowel in the right lower quadrant demonstrates significant atheromatous plaque and multifocal high-grade stenoses. Renals: There is high-grade stenosis at the origin of the left renal artery. Moderate stenosis is seen within the origin of the right renal artery. No dissection, aneurysm, or fibromuscular dysplasia. IMA: Patent without evidence of aneurysm, dissection, vasculitis or significant stenosis. Significant atherosclerosis. Inflow: There is significant atheromatous plaque within the bilateral common iliac,  external iliac, and internal iliac arteries. No high-grade stenosis. Proximal Outflow: There is extensive atherosclerosis throughout the visualized portions of the common femoral and superficial femoral arteries. No high-grade stenosis. Veins: No obvious venous abnormality within the limitations of this arterial phase study. Review of the MIP images confirms the above findings. NON-VASCULAR Lower chest: No acute pleural or parenchymal lung disease. Scattered emphysematous changes are noted. Hepatobiliary: Numerous cysts are seen throughout the liver. No other focal abnormalities. The gallbladder is unremarkable. Pancreas: Unremarkable. No pancreatic ductal dilatation or surrounding inflammatory changes. Spleen: Normal in size without focal abnormality. Adrenals/Urinary Tract: Extensive bilateral renal cortical atrophy with diffuse cystic changes consistent with history of end-stage renal disease. Adrenals are unremarkable. The bladder is decompressed. Stomach/Bowel: No bowel obstruction or ileus. No bowel wall thickening or inflammatory change. No evidence of contrast accumulation within the bowel lumen to suggest active gastrointestinal hemorrhage. Lymphatic: No pathologic adenopathy. Reproductive: Surgical clips are seen within the prostate bed. Other: No free fluid or free gas.  No abdominal  wall hernia. Musculoskeletal: Diffuse changes of renal osteodystrophy. No acute bony abnormalities. Reconstructed images demonstrate no additional findings. IMPRESSION: VASCULAR 1. No evidence of active gastrointestinal hemorrhage. 2. Extensive atherosclerosis of the aorta and its branches. Focal areas of high-grade stenosis within distal branches of the SMA in the right lower quadrant. High-grade bilateral renal artery stenosis, left greater than right. NON-VASCULAR 1. Numerous bilateral hepatic cysts unchanged. 2. Bilateral renal cortical atrophy with diffuse cystic change, consistent with known end-stage renal disease. 3. No acute intra-abdominal or intrapelvic process. 4. Aortic Atherosclerosis (ICD10-I70.0) and Emphysema (ICD10-J43.9). Electronically Signed   By: Randa Ngo M.D.   On: 08/21/2020 00:02   DG OR LOCAL ABDOMEN  Result Date: 08/23/2020 CLINICAL DATA:  Recent exploratory laparotomy EXAM: OR LOCAL ABDOMEN COMPARISON:  CT from earlier in the same day. FINDINGS: Gastric catheter is noted within the stomach. Scattered contrast is noted throughout the bowel consistent with the recent CT examination. By carious excretion of contrast is noted within the gallbladder. Left femoral sheath is noted. Laparotomy pads are noted in the left upper quadrant. By clinical history, these were left on purpose for packing. IMPRESSION: Postsurgical changes with laparotomy pads used as packing in the left upper quadrant. No other focal abnormality is noted. Electronically Signed   By: Inez Catalina M.D.   On: 08/23/2020 19:09   DG ESOPHAGUS W SINGLE CM (SOL OR THIN BA)  Result Date: 08/22/2020 CLINICAL DATA:  Chest pain following recent colonoscopy with esophageal intubation. Evaluate for esophageal leak. Water-soluble study requested. EXAM: ESOPHOGRAM/BARIUM SWALLOW TECHNIQUE: Single contrast examination was performed using Omnipaque 300 by mouth. FLUOROSCOPY TIME:  Fluoroscopy Time: 1 minutes and 18 seconds of  low-dose pulsed fluoroscopy Radiation Exposure Index (if provided by the fluoroscopic device): 15.4 mGy Number of Acquired Spot Images: 1 COMPARISON:  Chest radiographs same date.  Chest CT 06/17/2018. FINDINGS: The patient has somewhat limited mobility. These emanation was performed in the AP semi erect and RPO supine positions. The patient swallowed the contrast without difficulty. There is mild esophageal dysmotility. No evidence of esophageal leak, mass or ulceration. There is no laryngeal penetration or aspiration. Right subclavian/brachiocephalic vascular stent and left IJ central venous catheter are noted. IMPRESSION: No evidence of esophageal leak or other injury. Mild esophageal dysmotility. Electronically Signed   By: Richardean Sale M.D.   On: 08/22/2020 16:12    Lab Data:  CBC: Recent Labs  Lab 08/25/20 1351 08/26/20 0432 08/27/20 0423 08/28/20 1029 08/29/20 0120  08/29/20 2030 08/30/20 1049 08/31/20 0450  WBC 16.0* 13.1* 10.4 11.8* 10.9*  --  10.5 9.2  NEUTROABS 12.6* 10.7*  --   --  8.4*  --   --   --   HGB 8.4* 8.1* 7.4* 8.7* 7.6* 9.1* 9.3* 9.2*  HCT 24.6* 24.0* 22.2* 26.8* 24.0* 27.7* 28.6* 28.0*  MCV 90.1 92.3 92.1 95.0 96.4  --  94.7 93.3  PLT 139* 155 163 244 233  --  302 381   Basic Metabolic Panel: Recent Labs  Lab 08/27/20 0423 08/27/20 1434 08/28/20 0404 08/28/20 1535 08/29/20 0120 08/29/20 1900 08/30/20 0413 08/30/20 1724 08/31/20 0450  NA 135   < > 137   < > 139 137 138 137 135  K 2.9*   < > 3.6   < > 3.7 3.7 3.8 3.4* 3.5  CL 100   < > 99   < > 100 97* 99 99 98  CO2 24   < > 27   < > 29 27 29 29 28   GLUCOSE 117*   < > 114*   < > 82 87 79 75 78  BUN 35*   < > 55*   < > 37* 46* 48* 16 20  CREATININE 6.58*   < > 8.15*   < > 6.32* 7.53* 8.14* 4.05* 5.23*  CALCIUM 8.2*   < > 9.0   < > 8.7* 9.1 9.1 9.2 8.9  MG 2.0  --  2.3  --  2.2  --  2.2  --  2.1  PHOS 2.9  --  3.7   < > 3.5 4.4 4.6 2.3* 3.1   < > = values in this interval not displayed.    GFR: Estimated Creatinine Clearance: 10.9 mL/min (A) (by C-G formula based on SCr of 5.23 mg/dL (H)). Liver Function Tests: Recent Labs  Lab 08/25/20 0431 08/25/20 1351 08/26/20 0432 08/26/20 1902 08/27/20 0423 08/28/20 0404 08/29/20 0120 08/29/20 1900 08/30/20 0413 08/30/20 1724 08/31/20 0450  AST 136*  --  79*  --  58*  --  39  --   --   --   --   ALT 160*  --  82*  --  48*  --  25  --   --   --   --   ALKPHOS 53  --  56  --  53  --  67  --   --   --   --   BILITOT 0.6  --  0.7  --  0.6  --  0.6  --   --   --   --   PROT 4.8*  --  5.0*  --  4.9*  --  5.0*  --   --   --   --   ALBUMIN 2.4*   < > 2.3*   < > 2.1*   < > 2.0* 2.3* 2.3* 2.9* 2.7*   < > = values in this interval not displayed.   No results for input(s): LIPASE, AMYLASE in the last 168 hours. No results for input(s): AMMONIA in the last 168 hours. Coagulation Profile: Recent Labs  Lab 08/25/20 0431  INR 1.3*   Cardiac Enzymes: No results for input(s): CKTOTAL, CKMB, CKMBINDEX, TROPONINI in the last 168 hours. BNP (last 3 results) No results for input(s): PROBNP in the last 8760 hours. HbA1C: Recent Labs    08/29/20 0120  HGBA1C 5.5   CBG: Recent Labs  Lab 08/30/20 1107 08/30/20 1708 08/30/20 2152 08/31/20 0629 08/31/20  0830  GLUCAP 86 80 68* 75 81   Lipid Profile: Recent Labs    08/29/20 0120  TRIG 135   Thyroid Function Tests: No results for input(s): TSH, T4TOTAL, FREET4, T3FREE, THYROIDAB in the last 72 hours. Anemia Panel: No results for input(s): VITAMINB12, FOLATE, FERRITIN, TIBC, IRON, RETICCTPCT in the last 72 hours. Urine analysis:    Component Value Date/Time   COLORURINE STRAW (A) 03/28/2018 0300   APPEARANCEUR CLEAR 03/28/2018 0300   LABSPEC 1.000 (L) 03/28/2018 0300   PHURINE 6.0 03/28/2018 0300   GLUCOSEU NEGATIVE 03/28/2018 0300   HGBUR NEGATIVE 03/28/2018 0300   BILIRUBINUR NEGATIVE 03/28/2018 0300   KETONESUR NEGATIVE 03/28/2018 0300   PROTEINUR NEGATIVE  03/28/2018 0300   NITRITE NEGATIVE 03/28/2018 0300   LEUKOCYTESUR NEGATIVE 03/28/2018 0300     Ryen Rhames M.D. [facility] 08/31/2020, 10:50 AM  Available via Epic secure chat 7am-7pm After 7 pm, please refer to night coverage provider listed on amion.

## 2020-08-31 NOTE — Care Management Important Message (Signed)
Important Message  Patient Details  Name: Logan Martin MRN: [id number] Date of Birth: January 30, 1956   Medicare Important Message Given:  Yes     Shelda Altes 08/31/2020, 10:10 AM

## 2020-08-31 NOTE — Progress Notes (Signed)
Patient not present on unit at time of yellow MEWS result. On admission patient is green. MEWS protocol is therefore not enacted, vital signs will proceed q4.

## 2020-09-01 LAB — TYPE AND SCREEN
ABO/RH(D): O POS
Antibody Screen: POSITIVE
DAT, IgG: POSITIVE
Donor AG Type: NEGATIVE
Donor AG Type: NEGATIVE
Unit division: 0
Unit division: 0

## 2020-09-01 LAB — BPAM RBC
Blood Product Expiration Date: 202204292359
Blood Product Expiration Date: 202205052359
ISSUE DATE / TIME: 202204041529
Unit Type and Rh: 5100
Unit Type and Rh: 5100

## 2020-09-01 NOTE — Progress Notes (Signed)
Renal Navigator faxed my note as well as AMA note to [facility] to notify of patient leaving AMA yesterday.   Alphonzo Cruise, [place] [facility] Navigator [phone]

## 2020-09-01 NOTE — Discharge Summary (Signed)
AMA NOTE    Patient ID: Logan Martin MRN: [id number] DOB/AGE: 01-05-1956 65 y.o.  Admit date: 08/19/2020 Discharge date: 09/01/2020   PLEASE NOTE THAT PATIENT LEFT AGAINST MEDICAL ADVICE. Risks of sepsis, death were explained in detail to the patient and he verbally understood the risks of leaving AMA.   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 Heparin Rash    Pork products   . Pork-Derived Products Rash  . Sulfa Antibiotics Rash     Discharge Medications: Please note that patient left AMA (against medical advice)   Brief H and P: For complete details please refer to admission H and P, but in brief **Patient is a 65 year old male with history of ESRD on HD MWF, hypertension, hyperlipidemia, CAD, history of adenocarcinoma of the bladder, anemia, thrombocytopenia, history of PE, hepatitis C admitted on 3/25 with progressive fatigue, generalized weakness, melanotic stools for a week.  CT abdomen and pelvis showed no acute source of bleeding, focal areas of high-grade stenosis within distal branches of SMA in the right lower quadrant. Nephrology and GI were consulted.  Patient underwent a colonoscopy on 3/28 which showed diverticulosis, 2  polyps were removed, sent for biopsy, no evidence of GI bleed. Patient has required ongoing transfusions throughout the hospitalization for anemia.  On 3/29, patient acutely complained of worsening abdominal pain, chronic back pain.  He was found to have splenic rupture, hemorrhagic shock requiring massive transfusion, underwent emergent exploratory laparotomy, splenectomy, wound VAC.  Underwent reexploration, ex lap, washout and closure on 3/31 by Dr. Redmond Pulling. Patient was transferred out to the floor by PCCM, [place] assumed care on 08/31/2020  Significant Hospital Events: Including procedures, antibiotic start and stop dates in addition to other pertinent events    3/25 admitted to [facility], nephrology following   3/26 L IJ DL CVL placed by IR (of note, unable to pass via right IJ due to occlusion and lower right IJ at venous stent)  3/27 [facility] consulted  3/28 colonoscopy by Dr. Lyndel Safe with two polyps removed, required intubation during procedure for flash pulmonary edema; extubated post procedure  3/29 transfused 4th unit PRBC this am. During afternoon, acute decompensation/ hypotension on floor ->RRT, vasopresors started, stat CT abd/ pelvis and surgical consult ->intubated, then to OR with ruptured spleen s/p MTP and splenectomy  3/30 EGD 3/30 - healing Mallory-Weiss tear, grade 1 esophageal varices, small hiatal hernia  3/31 OR >> washout & abd closure  4/1off vasopressin and Levophed , hemodialysis, started tube feeds  4/2: Patient was extubated  4/6 :  [facility] assumed care    Hospital Course:   Hemorrhagic shock secondary to splenic rupture and hemoperitoneum, acute blood loss anemia -Status post ex lap and splenectomy on 3/29, reexploration, ex lap, washout and closure on 3/31 (Dr. Redmond Pulling) -Patient received 9 packed RBCs, 6 FFP, 1 platelet 1 cryo,  transfuse for hemoglobin less than 7 -General surgery following, tolerating dysphagia 3 diet, continue PPI -Per surgery, no  antibiotics needed from abdominal standpoint, wound healing well. -Received Zosyn for 5 days. -Shock physiology resolved, BP stable -He was not yet cleared by general surgery for discharge.  After hemodialysis, patient signed out AMA stating family emergency to attend.  Acute hypoxic respiratory failure -Patient was intubated on 3/28, required intubation during coloscopy procedure for flash pulmonary edema, was extubated on 4/2   ESRD on hemodialysis MWF -Nephrology following, undergoing hemodialysis per his schedule -Patient was continued on Nepro multivitamin, calcitriol, Aranesp underwent HD on 4/6  Essential hypertension, hyperlipidemia, history of CAD - Hypotension resolved. 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 Lovena Neighbours History of cystoprostatectomy and resection bladder augmentation, enterolysis in 07/2018 at [facility] -Outpatient follow-up with urology   Moderate protein calorie malnutrition secondary to chronic illness, ESRD on HD, acute illness -Dietitian following, continue nutritional supplements, dysphagia 3 diet with thin liquids, advance as tolerated   After hemodialysis, patient left AMA stating he had a family emergency.    BP (!) 140/98 (BP Location: Left Arm)   Pulse (!) 102   Temp 97.6 F (36.4 C) (Oral)   Resp 18   Ht 5\' 4"  (1.626 m)   Wt 51.5 kg   SpO2 98%   BMI 19.49 kg/m      The results of  significant diagnostics from this hospitalization (including imaging, microbiology, ancillary and laboratory) are listed below for reference.    LAB RESULTS: Basic Metabolic Panel: Recent Labs  Lab 08/30/20 1724 08/31/20 0450  NA 137 135  K 3.4* 3.5  CL 99 98  CO2 29 28  GLUCOSE 75 78  BUN 16 20  CREATININE 4.05* 5.23*  CALCIUM 9.2 8.9  MG  --  2.1  PHOS 2.3* 3.1   Liver Function Tests: Recent Labs  Lab 08/27/20 0423 08/28/20 0404 08/29/20 0120 08/29/20 1900 08/30/20 1724 08/31/20 0450  AST 58*  --  39  --   --   --   ALT 48*  --  25  --   --   --   ALKPHOS 53  --  67  --   --   --   BILITOT 0.6  --  0.6  --   --   --   PROT 4.9*  --  5.0*  --   --   --   ALBUMIN 2.1*   < > 2.0*   < > 2.9* 2.7*   < > = values in this interval not displayed.   No results for input(s): LIPASE, AMYLASE in the last 168 hours. No results for input(s): AMMONIA in the last 168 hours. CBC: Recent Labs  Lab 08/29/20 0120 08/29/20 2030 08/30/20 1049 08/31/20 0450  WBC 10.9*  --  10.5 9.2  NEUTROABS 8.4*  --   --   --   HGB 7.6*   < > 9.3* 9.2*  HCT 24.0*   < >  28.6* 28.0*  MCV 96.4  --  94.7 93.3  PLT 233  --  302 293   < > = values in this interval not displayed.   Cardiac Enzymes: No results for input(s): CKTOTAL, CKMB, CKMBINDEX, TROPONINI in the last 168 hours. BNP: Invalid input(s): POCBNP CBG: Recent Labs  Lab 08/31/20 0830 08/31/20 1128  GLUCAP 81 91    Significant Diagnostic Studies:  DG Chest 2 View  Result Date: 08/19/2020 CLINICAL DATA:  Chest pain shortness of breath EXAM: CHEST - 2 VIEW COMPARISON:  Chest radiograph May 04, 2020 FINDINGS: Right subclavian/innominate stent, unchanged. Mild cardiac enlargement. Central vascular congestion. Mild basilar predominant interstitial opacities. The visualized skeletal structures are unremarkable. IMPRESSION: Mild cardiac enlargement with central vascular congestion and basilar predominant interstitial opacities,  likely mild edema. Electronically Signed   By: Dahlia Bailiff MD   On: 08/19/2020 20:18   IR Veno/Jugular Right  Result Date: 08/20/2020 INDICATION: 65 year old with end-stage renal disease, hematochezia, anemia and poor venous access. Patient needs a central venous catheter. EXAM: PLACEMENT OF CENTRAL LINE WITH ULTRASOUND AND FLUOROSCOPIC GUIDANCE CENTRAL VENOGRAM COMPARISON:  None. MEDICATIONS: None ANESTHESIA/SEDATION: None FLUOROSCOPY TIME:  3 minutes, 48 seconds, 10.1 mGy) COMPLICATIONS: None immediate. PROCEDURE: The procedure, risks, benefits, and alternatives were explained to the patient. Questions regarding the procedure were encouraged and answered. The patient understands and consents to the procedure. Right side of the neck was evaluated with ultrasound. Ultrasound confirmed a patent right internal jugular vein. Ultrasound image was saved for documentation. Right side of the neck was prepped and draped in sterile fashion. Maximal barrier sterile technique was utilized including caps, mask, sterile gowns, sterile gloves, sterile drape, hand hygiene and skin antiseptic. Skin was anesthetized with 1% lidocaine. 21 gauge needle was directed into the right internal jugular vein but a wire would not advance centrally. Micropuncture dilator set was placed. Contrast was injected for a venogram. 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 Microcatheter was removed with manual compression. Dressing was placed. Ultrasound confirmed a patent left internal jugular vein. Ultrasound image was saved for documentation. Left side of the neck was prepped and draped in sterile fashion. Maximal barrier sterile technique was utilized including caps, mask, sterile gowns, sterile gloves, sterile drape, hand hygiene and skin antiseptic. Skin was anesthetized using 1% lidocaine. Small incision was made. 21 gauge needle was directed into the left internal jugular vein with ultrasound guidance.  Micropuncture dilator set was placed. Wire would not easily advance into the SVC due to the right innominate stent. Venogram was performed to confirm patency. Kumpe catheter and Glidewire were used to cannulate the SVC from the left jugular approach. A Platinum Plus wire was placed and the catheter was removed. Peel-away sheath was placed. A dual lumen Power PICC line was cut to 23 cm and advanced over the wire into the SVC. Wire was removed. Both lumens aspirated and flushed well. Catheter was sutured to the skin. Dressing was placed. 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 Wire would not advance into the SVC from right jugular approach. 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 Central venogram from the left jugular approach confirmed patency of the left innominate vein but there is significant narrowing at the junction of the left innominate vein and SVC related to the right innominate vein stent. Eventually a Kumpe catheter and Glidewire were able to cannulate the  SVC from a left jugular approach. The central venous catheter was positioned near the superior cavoatrial junction. IMPRESSION: Successful placement of a central venous catheter using ultrasound and fluoroscopic guidance. Catheter was placed from a left jugular approach. 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 Right jugular vein is patent in the neck but the lower portion of the right jugular vein is occluded from the right innominate vein stent. Patient is not a candidate for future right jugular central venous access. Electronically Signed   By: Markus Daft M.D.   On: 08/20/2020 15:15   IR Fluoro Guide CV Line Left  Result Date: 08/20/2020 INDICATION: 65 year old with end-stage renal disease, hematochezia, anemia and poor venous access.  Patient needs a central venous catheter. EXAM: PLACEMENT OF CENTRAL LINE WITH ULTRASOUND AND FLUOROSCOPIC GUIDANCE CENTRAL VENOGRAM COMPARISON:  None. MEDICATIONS: None ANESTHESIA/SEDATION: None FLUOROSCOPY TIME:  3 minutes, 48 seconds, 24.2 mGy) COMPLICATIONS: None immediate. PROCEDURE: The procedure, risks, benefits, and alternatives were explained to the patient. Questions regarding the procedure were encouraged and answered. The patient understands and consents to the procedure. Right side of the neck was evaluated with ultrasound. Ultrasound confirmed a patent right internal jugular vein. Ultrasound image was saved for documentation. Right side of the neck was prepped and draped in sterile fashion. Maximal barrier sterile technique was utilized including caps, mask, sterile gowns, sterile gloves, sterile drape, hand hygiene and skin antiseptic. Skin was anesthetized with 1% lidocaine. 21 gauge needle was directed into the right internal jugular vein but a wire would not advance centrally. Micropuncture dilator set was placed. Contrast was injected for a venogram. 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 Microcatheter was removed with manual compression. Dressing was placed. Ultrasound confirmed a patent left internal jugular vein. Ultrasound image was saved for documentation. Left side of the neck was prepped and draped in sterile fashion. Maximal barrier sterile technique was utilized including caps, mask, sterile gowns, sterile gloves, sterile drape, hand hygiene and skin antiseptic. Skin was anesthetized using 1% lidocaine. Small incision was made. 21 gauge needle was directed into the left internal jugular vein with ultrasound guidance. Micropuncture dilator set was placed. Wire would not easily advance into the SVC due to the right innominate stent. Venogram was performed to confirm patency. Kumpe catheter and Glidewire were used to cannulate the SVC from the left jugular  approach. A Platinum Plus wire was placed and the catheter was removed. Peel-away sheath was placed. A dual lumen Power PICC line was cut to 23 cm and advanced over the wire into the SVC. Wire was removed. Both lumens aspirated and flushed well. Catheter was sutured to the skin. Dressing was placed. 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 Wire would not advance into the SVC from right jugular approach. 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 Central venogram from the left jugular approach confirmed patency of the left innominate vein but there is significant narrowing at the junction of the left innominate vein and SVC related to the right innominate vein stent. Eventually a Kumpe catheter and Glidewire were able to cannulate the SVC from a left jugular approach. The central venous catheter was positioned near the superior cavoatrial junction. IMPRESSION: Successful placement of a central venous catheter using ultrasound and fluoroscopic guidance. Catheter was placed from a left jugular  approach. 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 Right jugular vein is patent in the neck but the lower portion of the right jugular vein is occluded from the right innominate vein stent. Patient is not a candidate for future right jugular central venous access. Electronically Signed   By: Markus Daft M.D.   On: 08/20/2020 15:15   IR US Guide Vasc Access Left  Result Date: 08/20/2020 INDICATION: 65 year old with end-stage renal disease, hematochezia, anemia and poor venous access. Patient needs a central venous catheter. EXAM: PLACEMENT OF CENTRAL LINE WITH ULTRASOUND AND FLUOROSCOPIC GUIDANCE CENTRAL VENOGRAM COMPARISON:  None. MEDICATIONS: None ANESTHESIA/SEDATION: None FLUOROSCOPY TIME:  3 minutes, 48 seconds, 20.2  mGy) COMPLICATIONS: None immediate. PROCEDURE: The procedure, risks, benefits, and alternatives were explained to the patient. Questions regarding the procedure were encouraged and answered. The patient understands and consents to the procedure. Right side of the neck was evaluated with ultrasound. Ultrasound confirmed a patent right internal jugular vein. Ultrasound image was saved for documentation. Right side of the neck was prepped and draped in sterile fashion. Maximal barrier sterile technique was utilized including caps, mask, sterile gowns, sterile gloves, sterile drape, hand hygiene and skin antiseptic. Skin was anesthetized with 1% lidocaine. 21 gauge needle was directed into the right internal jugular vein but a wire would not advance centrally. Micropuncture dilator set was placed. Contrast was injected for a venogram. 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 Microcatheter was removed with manual compression. Dressing was placed. Ultrasound confirmed a patent left internal jugular vein. Ultrasound image was saved for documentation. Left side of the neck was prepped and draped in sterile fashion. Maximal barrier sterile technique was utilized including caps, mask, sterile gowns, sterile gloves, sterile drape, hand hygiene and skin antiseptic. Skin was anesthetized using 1% lidocaine. Small incision was made. 21 gauge needle was directed into the left internal jugular vein with ultrasound guidance. Micropuncture dilator set was placed. Wire would not easily advance into the SVC due to the right innominate stent. Venogram was performed to confirm patency. Kumpe catheter and Glidewire were used to cannulate the SVC from the left jugular approach. A Platinum Plus wire was placed and the catheter was removed. Peel-away sheath was placed. A dual lumen Power PICC line was cut to 23 cm and advanced over the wire into the SVC. Wire was removed. Both lumens aspirated and flushed  well. Catheter was sutured to the skin. Dressing was placed. 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 Wire would not advance into the SVC from right jugular approach. 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 Central venogram from the left jugular approach confirmed patency of the left innominate vein but there is significant narrowing at the junction of the left innominate vein and SVC related to the right innominate vein stent. Eventually a Kumpe catheter and Glidewire were able to cannulate the SVC from a left jugular approach. The central venous catheter was positioned near the superior cavoatrial junction. IMPRESSION: Successful placement of a central venous catheter using ultrasound and fluoroscopic guidance. Catheter was placed from a left jugular approach. 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 Right jugular vein is patent in the neck but the lower portion of the right  jugular vein is occluded from the right innominate vein stent. Patient is not a candidate for future right jugular central venous access. Electronically Signed   By: Markus Daft M.D.   On: 08/20/2020 15:15   IR US Guide Vasc Access Right  Result Date: 08/20/2020 INDICATION: 65 year old with end-stage renal disease, hematochezia, anemia and poor venous access. Patient needs a central venous catheter. EXAM: PLACEMENT OF CENTRAL LINE WITH ULTRASOUND AND FLUOROSCOPIC GUIDANCE CENTRAL VENOGRAM COMPARISON:  None. MEDICATIONS: None ANESTHESIA/SEDATION: None FLUOROSCOPY TIME:  3 minutes, 48 seconds, 34.1 mGy) COMPLICATIONS: None immediate. PROCEDURE: The procedure, risks, benefits, and alternatives were explained to the patient. Questions regarding the procedure were encouraged and answered. The patient understands and consents to the  procedure. Right side of the neck was evaluated with ultrasound. Ultrasound confirmed a patent right internal jugular vein. Ultrasound image was saved for documentation. Right side of the neck was prepped and draped in sterile fashion. Maximal barrier sterile technique was utilized including caps, mask, sterile gowns, sterile gloves, sterile drape, hand hygiene and skin antiseptic. Skin was anesthetized with 1% lidocaine. 21 gauge needle was directed into the right internal jugular vein but a wire would not advance centrally. Micropuncture dilator set was placed. Contrast was injected for a venogram. 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 Microcatheter was removed with manual compression. Dressing was placed. Ultrasound confirmed a patent left internal jugular vein. Ultrasound image was saved for documentation. Left side of the neck was prepped and draped in sterile fashion. Maximal barrier sterile technique was utilized including caps, mask, sterile gowns, sterile gloves, sterile drape, hand hygiene and skin antiseptic. Skin was anesthetized using 1% lidocaine. Small incision was made. 21 gauge needle was directed into the left internal jugular vein with ultrasound guidance. Micropuncture dilator set was placed. Wire would not easily advance into the SVC due to the right innominate stent. Venogram was performed to confirm patency. Kumpe catheter and Glidewire were used to cannulate the SVC from the left jugular approach. A Platinum Plus wire was placed and the catheter was removed. Peel-away sheath was placed. A dual lumen Power PICC line was cut to 23 cm and advanced over the wire into the SVC. Wire was removed. Both lumens aspirated and flushed well. Catheter was sutured to the skin. Dressing was placed. 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 Wire would not advance into the SVC from right jugular approach. Ultrasound demonstrated occlusion of the lower right  internal jugular vein with collateral flow in the right shoulder region and near the midline. Central venogram from the left jugular approach confirmed patency of the left innominate vein but there is significant narrowing at the junction of the left innominate vein and SVC related to the right innominate vein stent. Eventually a Kumpe catheter and Glidewire were able to cannulate the SVC from a left jugular approach. The central venous catheter was positioned near the superior cavoatrial junction. IMPRESSION: Successful placement of a central venous catheter using ultrasound and fluoroscopic guidance. Catheter was placed from a left jugular approach. 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 Right jugular vein is patent in the neck but the lower portion of the right jugular vein is occluded from the right innominate vein stent. Patient is not a candidate for future right jugular central venous access. Electronically Signed   By: Markus Daft M.D.   On: 08/20/2020 15:15  CT Angio Abd/Pel w/ and/or w/o  Result Date: 08/21/2020 CLINICAL DATA:  Melena, gastrointestinal bleeding EXAM: CTA ABDOMEN AND PELVIS WITHOUT AND WITH CONTRAST TECHNIQUE: Multidetector CT imaging of the abdomen and pelvis was performed using the standard protocol during bolus administration of intravenous contrast. Multiplanar reconstructed images and MIPs were obtained and reviewed to evaluate the vascular anatomy. CONTRAST:  148mL OMNIPAQUE IOHEXOL 350 MG/ML SOLN COMPARISON:  03/27/2018 FINDINGS: VASCULAR Aorta: Normal caliber aorta without aneurysm, dissection, vasculitis or significant stenosis. Diffuse atherosclerosis. Celiac: Patent without evidence of aneurysm, dissection, vasculitis or significant stenosis. SMA: The SMA is patent at its origin without focal stenosis, aneurysm, or dissection. Distal branches of the SMA supplying the bowel in the right lower  quadrant demonstrates significant atheromatous plaque and multifocal high-grade stenoses. Renals: There is high-grade stenosis at the origin of the left renal artery. Moderate stenosis is seen within the origin of the right renal artery. No dissection, aneurysm, or fibromuscular dysplasia. IMA: Patent without evidence of aneurysm, dissection, vasculitis or significant stenosis. Significant atherosclerosis. Inflow: There is significant atheromatous plaque within the bilateral common iliac, external iliac, and internal iliac arteries. No high-grade stenosis. Proximal Outflow: There is extensive atherosclerosis throughout the visualized portions of the common femoral and superficial femoral arteries. No high-grade stenosis. Veins: No obvious venous abnormality within the limitations of this arterial phase study. Review of the MIP images confirms the above findings. NON-VASCULAR Lower chest: No acute pleural or parenchymal lung disease. Scattered emphysematous changes are noted. Hepatobiliary: Numerous cysts are seen throughout the liver. No other focal abnormalities. The gallbladder is unremarkable. Pancreas: Unremarkable. No pancreatic ductal dilatation or surrounding inflammatory changes. Spleen: Normal in size without focal abnormality. Adrenals/Urinary Tract: Extensive bilateral renal cortical atrophy with diffuse cystic changes consistent with history of end-stage renal disease. Adrenals are unremarkable. The bladder is decompressed. Stomach/Bowel: No bowel obstruction or ileus. No bowel wall thickening or inflammatory change. No evidence of contrast accumulation within the bowel lumen to suggest active gastrointestinal hemorrhage. Lymphatic: No pathologic adenopathy. Reproductive: Surgical clips are seen within the prostate bed. Other: No free fluid or free gas.  No abdominal wall hernia. Musculoskeletal: Diffuse changes of renal osteodystrophy. No acute bony abnormalities. Reconstructed images demonstrate no  additional findings. IMPRESSION: VASCULAR 1. No evidence of active gastrointestinal hemorrhage. 2. Extensive atherosclerosis of the aorta and its branches. Focal areas of high-grade stenosis within distal branches of the SMA in the right lower quadrant. High-grade bilateral renal artery stenosis, left greater than right. NON-VASCULAR 1. Numerous bilateral hepatic cysts unchanged. 2. Bilateral renal cortical atrophy with diffuse cystic change, consistent with known end-stage renal disease. 3. No acute intra-abdominal or intrapelvic process. 4. Aortic Atherosclerosis (ICD10-I70.0) and Emphysema (ICD10-J43.9). Electronically Signed   By: Randa Ngo M.D.   On: 08/21/2020 00:02    2D ECHO:   Disposition and Follow-up:    DISPOSITION: Patient left AMA. He was advised to seek follow-up with primary care physician.     DISCHARGE FOLLOW-UP  Follow-up Information    Greer Pickerel, MD. Go on 09/28/2020.   Specialty: General Surgery Why: at 10:00 AM for follow up after your recent splenectomy. please arrive 30 minutes early to get checked in and fill out any necessary paperwork.  Contact information: [address] [place] [id number] [phone]                  Signed:   Estill Cotta M.D. [facility] 09/01/2020, 7:08 AM

## 2020-09-02 DIAGNOSIS — T782XXA Anaphylactic shock, unspecified, initial encounter: Secondary | ICD-10-CM | POA: Insufficient documentation

## 2020-09-06 DIAGNOSIS — N07 Hereditary nephropathy, 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: Secondary | ICD-10-CM | POA: Insufficient documentation

## 2020-09-06 DIAGNOSIS — L299 Pruritus, unspecified: Secondary | ICD-10-CM | POA: Insufficient documentation

## 2020-09-06 DIAGNOSIS — R197 Diarrhea, unspecified: Secondary | ICD-10-CM | POA: Insufficient documentation

## 2020-09-06 DIAGNOSIS — D689 Coagulation defect, unspecified: Secondary | ICD-10-CM | POA: Insufficient documentation

## 2020-09-12 ENCOUNTER — Other Ambulatory Visit: Payer: Self-pay

## 2020-09-12 DIAGNOSIS — N186 End stage renal disease: Secondary | ICD-10-CM

## 2020-09-12 DIAGNOSIS — Z992 Dependence on renal dialysis: Secondary | ICD-10-CM

## 2020-09-15 ENCOUNTER — Other Ambulatory Visit: Payer: Self-pay

## 2020-09-15 ENCOUNTER — Ambulatory Visit (INDEPENDENT_AMBULATORY_CARE_PROVIDER_SITE_OTHER): Payer: Medicare HMO | Admitting: Physician Assistant

## 2020-09-15 ENCOUNTER — Ambulatory Visit (HOSPITAL_COMMUNITY)
Admission: RE | Admit: 2020-09-15 | Discharge: 2020-09-15 | Disposition: A | Discharge status: Home / Self Care | Payer: Medicare HMO | Source: Ambulatory Visit | Attending: Vascular Surgery | Admitting: Vascular Surgery

## 2020-09-15 VITALS — BP 161/81 | HR 92 | Temp 98.3°F | Resp 20 | Ht 64.0 in | Wt 99.2 lb

## 2020-09-15 DIAGNOSIS — Z992 Dependence on renal dialysis: Secondary | ICD-10-CM

## 2020-09-15 DIAGNOSIS — N186 End stage renal disease: Secondary | ICD-10-CM | POA: Insufficient documentation

## 2020-09-15 MED ORDER — SODIUM CHLORIDE 0.9 % IV SOLN: 250.0000 mL | INTRAVENOUS | Status: DC | PRN

## 2020-09-15 MED ORDER — SODIUM CHLORIDE 0.9% FLUSH: 3.0000 mL | Freq: Two times a day (BID) | INTRAVENOUS | Status: DC

## 2020-09-15 MED ORDER — SODIUM CHLORIDE 0.9% FLUSH: 3.0000 mL | INTRAVENOUS | Status: DC | PRN

## 2020-09-15 NOTE — Progress Notes (Signed)
Office Note     CC:  follow up Requesting Provider:  [name], NP  HPI: Logan Martin is a 65 y.o. (1956/03/12) male who presents for evaluation of poorly functioning right arm AV fistula.  He underwent fistula revision by plication by Dr. Doren Custard in October of last year.  He states the revised portion of the fistula works well however above this area (closer to the axilla) the venous pressures are too high.  He dialyzes on a Monday Wednesday Friday schedule at the [facility] location under the care of Dr. Carmina Miller.  Last month he underwent emergent laparotomy for a ruptured spleen.  He also was noted to have a GI bleed from Mallory-Weiss tear of esophagus.  Patient states he is recovering well since being discharged from the hospital.  Past Medical History:  Diagnosis Date  . Alcohol abuse    quit date in 1995  . Anemia   . Cancer ([place])    bladder  . Chronic low back pain   . Cocaine abuse in remission ([place])    Quit date in 1995  . ESRD (end stage renal disease) ([place])    Home Dialysis  . 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 Started HD in [place], [place].  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  On NxStage home hemo since 2010. Using RUA AVF.    [name] Hearing aid worn    B/L  . Hepatitis C    s/p treatment with Mavyret 03/2017.  undetedctable virus in 03/2017  . History of blood transfusion 2007 X 1  . History of kidney stones   . Hx of constipation   . Hypertension   . Left inguinal hernia   . Peripheral vascular disease ([place])   . Pulmonary embolism [facility]) Feb 2012   Treated with coumadin x 1 year  . Wears glasses     Past Surgical History:  Procedure Laterality Date  . A/V FISTULAGRAM N/A 11/22/2017   Procedure: A/V Fistulagram;  Surgeon: Serafina Mitchell, MD;  Location: [place] [facility];  Service: Cardiovascular;  Laterality: N/A;  rt. arm   . ABDOMINAL HERNIA REPAIR  2000    x 2  . APPLICATION OF WOUND VAC N/A  08/23/2020   Procedure: APPLICATION OF ABDOMINAL WOUND VAC;  Surgeon: Greer Pickerel, MD;  Location: [place];  Service: General;  Laterality: N/A;  . AV FISTULA PLACEMENT Right 2007   upper arm  . AV FISTULA REPAIR Right ~ 03/2016 & 04/2016  . BLADDER AUGMENTATION  1975  . COLONOSCOPY    . COLONOSCOPY WITH PROPOFOL N/A 08/22/2020   Procedure: COLONOSCOPY WITH PROPOFOL;  Surgeon: Jackquline Denmark, MD;  Location: [facility] ENDOSCOPY;  Service: Endoscopy;  Laterality: N/A;  . CORONARY ANGIOPLASTY WITH STENT PLACEMENT  1999  . CYSTOSCOPY W/ URETERAL STENT PLACEMENT N/A 10/13/2017   Procedure: CYSTOSCOPY WITH IRRIGATION OF BLADDER PLACEMENT OF FOLEY;  Surgeon: Ceasar Mons, MD;  Location: [place];  Service: Urology;  Laterality: N/A;  . CYSTOSCOPY WITH FULGERATION N/A 03/27/2018   Procedure: [place] AND MUCOUS EVACUATION, BLADDER BIOPSY;  Surgeon: Franchot Gallo, MD;  Location: [facility];  Service: Urology;  Laterality: N/A;  . ESOPHAGOGASTRODUODENOSCOPY    . ESOPHAGOGASTRODUODENOSCOPY N/A 08/24/2020   Procedure: ESOPHAGOGASTRODUODENOSCOPY (EGD);  Surgeon: Jackquline Denmark, MD;  Location: [facility] ENDOSCOPY;  Service: Endoscopy;  Laterality: N/A;  . EXTRACORPOREAL SHOCK WAVE LITHOTRIPSY    . FISTULA SUPERFICIALIZATION Right 12/29/6960   Procedure: PLICATION OF  ANEURYSM OF ARTERIOVENOUS FISTULA;  Surgeon: Angelia Mould, MD;  Location: [place];  Service: Vascular;  Laterality: Right;  . HERNIA REPAIR    . INGUINAL HERNIA REPAIR Right 1975  . INGUINAL HERNIA REPAIR Left 03/04/2019   Procedure: LEFT OPEN INGUINAL HERNIA REPAIR WITH MESH;  Surgeon: Kinsinger, Arta Bruce, MD;  Location: [place];  Service: General;  Laterality: Left;  GENERAL AND LMA  . INSERTION OF DIALYSIS CATHETER Left 03/15/2020   Procedure: INSERTION OF Left Internal Jugular DIALYSIS CATHETER;  Surgeon: [name], MD;  Location: [place];  Service: Vascular;  Laterality: Left;  . IR FLUORO GUIDE CV LINE LEFT  03/11/2020   . IR FLUORO GUIDE CV LINE LEFT  08/20/2020  . IR US GUIDE VASC ACCESS LEFT  03/11/2020  . IR US GUIDE VASC ACCESS LEFT  08/20/2020  . IR US GUIDE VASC ACCESS RIGHT  08/20/2020  . IR VENO/JUGULAR RIGHT  08/20/2020  . LAPAROTOMY N/A 08/23/2020   Procedure: EXPLORATORY LAPAROTOMY;  Surgeon: Greer Pickerel, MD;  Location: [place];  Service: General;  Laterality: N/A;  . LAPAROTOMY N/A 08/25/2020   Procedure: RE EXPLORE EXPLORATORY LAPAROTOMY, WASHOUT AND CLOSURE;  Surgeon: Greer Pickerel, MD;  Location: [place];  Service: General;  Laterality: N/A;  . LEFT HEART CATH AND CORONARY ANGIOGRAPHY N/A 05/06/2020   Procedure: LEFT HEART CATH AND CORONARY ANGIOGRAPHY;  Surgeon: Nigel Mormon, MD;  Location: [place] [facility];  Service: Cardiovascular;  Laterality: N/A;  . PERIPHERAL VASCULAR BALLOON ANGIOPLASTY  11/22/2017   Procedure: PERIPHERAL VASCULAR BALLOON ANGIOPLASTY;  Surgeon: Serafina Mitchell, MD;  Location: [place] [facility];  Service: Cardiovascular;;  rt. arm fistula  . PERIPHERAL VASCULAR CATHETERIZATION N/A 06/22/2016   Procedure: Left Arm Venography;  Surgeon: Elam Dutch, MD;  Location: [place] [facility];  Service: Cardiovascular;  Laterality: N/A;  . PERIPHERAL VASCULAR CATHETERIZATION N/A 06/22/2016   Procedure: A/V Fistulagram - Right Arm;  Surgeon: Elam Dutch, MD;  Location: [place] [facility];  Service: Cardiovascular;  Laterality: N/A;  . PERIPHERAL VASCULAR CATHETERIZATION Right 06/22/2016   Procedure: Peripheral Vascular Balloon Angioplasty;  Surgeon: Elam Dutch, MD;  Location: [place] [facility];  Service: Cardiovascular;  Laterality: Right;  arm fistula  . POLYPECTOMY  08/22/2020   Procedure: POLYPECTOMY;  Surgeon: Jackquline Denmark, MD;  Location: [facility] ENDOSCOPY;  Service: Endoscopy;;  . REVISON OF ARTERIOVENOUS FISTULA Right 03/15/2020   Procedure: RIGHT ARM ARTERIOVENOUS FISTULA REVISON;  Surgeon: [name], MD;  Location: [place];  Service: Vascular;  Laterality: Right;   . SPLENECTOMY, TOTAL N/A 08/23/2020   Procedure: SPLENECTOMY;  Surgeon: Greer Pickerel, MD;  Location: [facility] OR;  Service: General;  Laterality: N/A;    Social History   Socioeconomic History  . Marital status: Single    Spouse name: Not on file  . Number of children: Not on file  . Years of education: Not on file  . Highest education level: Not on file  Occupational History  . Not on file  Tobacco Use  . Smoking status: Former Smoker    Packs/day: 1.00    Years: 5.00    Pack years: 5.00    Types: Cigarettes    Quit date: 05/28/1993    Years since quitting: 27.3  . Smokeless tobacco: Never Used  Vaping Use  . Vaping Use: Never used  Substance and Sexual Activity  . Alcohol use: Not Currently    Comment:  "nothing since 1995"  . Drug  use: Not Currently    Types: Marijuana, Cocaine    Comment: denies 03/02/19  . Sexual activity: Yes  Other Topics Concern  . Not on file  Social History Narrative  . Not on file   Social Determinants of Health   Financial Resource Strain: Not on file  Food Insecurity: Not on file  Transportation Needs: Not on file  Physical Activity: Not on file  Stress: Not on file  Social Connections: Not on file  Intimate Partner Violence: Not on file    Family History  Problem Relation Age of Onset  . Cancer Mother   . Diabetes Mother   . Hypertension Mother   . Deep vein thrombosis Daughter     Current Outpatient Medications  Medication Sig Dispense Refill  . amLODipine (NORVASC) 10 MG tablet Take 1 tablet (10 mg total) by mouth daily. 30 tablet 0  . aspirin EC 81 MG tablet Take 81 mg by mouth at bedtime.     [name] atorvastatin (LIPITOR) 40 MG tablet Take 40 mg by mouth daily.    Lorin Picket 1 GM 210 MG(Fe) tablet Take 420 mg by mouth 3 (three) times daily.    . hydrALAZINE (APRESOLINE) 25 MG tablet Take 1 tablet (25 mg total) by mouth 3 (three) times daily. 90 tablet 0  . hydrOXYzine (VISTARIL) 50 MG capsule Take 50 mg by mouth daily at 6 (six) AM.     . labetalol (NORMODYNE) 100 MG tablet Take 100 mg by mouth daily.    [name] lidocaine-prilocaine (EMLA) cream Apply 1 application topically See admin instructions. Apply small amount to dialysis port (AVF) one hour before dialysis. Cover with occlusive dressing (saran wrap)  6  . LOKELMA 5 g packet Take 1 packet by mouth daily.    . methocarbamol (ROBAXIN) 750 MG tablet Take 750 mg by mouth every 8 (eight) hours as needed for muscle spasms.    [name] oxyCODONE (OXY IR/ROXICODONE) 5 MG immediate release tablet Take 5 mg by mouth 2 (two) times daily as needed for moderate pain.    [name] sucralfate (CARAFATE) 1 g tablet Take 1 g by mouth daily.    . traZODone (DESYREL) 150 MG tablet Take 150 mg by mouth daily as needed.    . Vitamin D, Ergocalciferol, (DRISDOL) 1.25 MG (50000 UNIT) CAPS capsule Take 50,000 Units by mouth once a week.    . isosorbide mononitrate (IMDUR) 30 MG 24 hr tablet Take 30 mg by mouth daily.      No current facility-administered medications for this visit.    Allergies  Allergen Reactions  . Heparin Rash    Pork products   . Pork-Derived Products Rash  . Sulfa Antibiotics Rash     REVIEW OF SYSTEMS:   [X]  denotes positive finding, [ ]  denotes negative finding Cardiac  Comments:  Chest pain or chest pressure:    Shortness of breath upon exertion:    Short of breath when lying flat:    Irregular heart rhythm:        Vascular    Pain in calf, thigh, or hip brought on by ambulation:    Pain in feet at night that wakes you up from your sleep:     Blood clot in your veins:    Leg swelling:         Pulmonary    Oxygen at home:    Productive cough:     Wheezing:         Neurologic    Sudden  weakness in arms or legs:     Sudden numbness in arms or legs:     Sudden onset of difficulty speaking or slurred speech:    Temporary loss of vision in one eye:     Problems with dizziness:         Gastrointestinal    Blood in stool:     Vomited blood:         Genitourinary     Burning when urinating:     Blood in urine:        Psychiatric    Major depression:         Hematologic    Bleeding problems:    Problems with blood clotting too easily:        Skin    Rashes or ulcers:        Constitutional    Fever or chills:      PHYSICAL EXAMINATION:  Vitals:   09/15/20 1038  BP: (!) 161/81  Pulse: 92  Resp: 20  Temp: 98.3 F (36.8 C)  TempSrc: Temporal  SpO2: 95%  Weight: 99 lb 3.3 oz (45 kg)  Height: 5\' 4"  (1.626 m)    General:  WDWN in NAD; vital signs documented above Gait: Not observed HENT: WNL, normocephalic Pulmonary: normal non-labored breathing , without Rales, rhonchi,  wheezing Cardiac: regular HR Abdomen: soft, NT, no masses Skin: with rashes Extremities: Right arm AV fistula with some hypopigmentation in the area of previously revised fistula; there is a palpable thrill throughout the fistula; he has prominent surface veins in his right chest and neck Musculoskeletal: no muscle wasting or atrophy  Neurologic: A&O X 3;  No focal weakness or paresthesias are detected Psychiatric:  The pt has Normal affect.   Non-Invasive Vascular Imaging:   Mildly elevated velocity mid fistula and upper arm    ASSESSMENT/PLAN:: 65 y.o. male here for evaluation of high venous pressures of right arm AV fistula  -Right upper arm AV fistula is patent with palpable thrill and no signs or symptoms of steal syndrome -Patient was referred to the office due to high venous pressures; on exam patient has prominent right chest and neck surface veins possibly indicating a central venous stenosis -Ok to continue HD via right arm AV fistula -Plan will be for right arm fistulogram with possible intervention at the next available date -This procedure was explained to the patient and all questions were answered and he agrees to proceed   Dagoberto Ligas, PA-C [facility] [phone]  Clinic MD:   Oneida Alar

## 2020-09-15 NOTE — H&P (View-Only) (Signed)
Office Note     CC:  follow up Requesting Provider:  [name], NP  HPI: Logan Martin is a 65 y.o. (01-09-1956) male who presents for evaluation of poorly functioning right arm AV fistula.  He underwent fistula revision by plication by Dr. Doren Custard in October of last year.  He states the revised portion of the fistula works well however above this area (closer to the axilla) the venous pressures are too high.  He dialyzes on a Monday Wednesday Friday schedule at the [facility] location under the care of Dr. Carmina Miller.  Last month he underwent emergent laparotomy for a ruptured spleen.  He also was noted to have a GI bleed from Mallory-Weiss tear of esophagus.  Patient states he is recovering well since being discharged from the hospital.  Past Medical History:  Diagnosis Date  . Alcohol abuse    quit date in 1995  . Anemia   . Cancer ([place])    bladder  . Chronic low back pain   . Cocaine abuse in remission ([place])    Quit date in 1995  . ESRD (end stage renal disease) ([place])    Home Dialysis  . 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 Started HD in [place], [place].  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  On NxStage home hemo since 2010. Using RUA AVF.    [name] Hearing aid worn    B/L  . Hepatitis C    s/p treatment with Mavyret 03/2017.  undetedctable virus in 03/2017  . History of blood transfusion 2007 X 1  . History of kidney stones   . Hx of constipation   . Hypertension   . Left inguinal hernia   . Peripheral vascular disease ([place])   . Pulmonary embolism [facility]) Feb 2012   Treated with coumadin x 1 year  . Wears glasses     Past Surgical History:  Procedure Laterality Date  . A/V FISTULAGRAM N/A 11/22/2017   Procedure: A/V Fistulagram;  Surgeon: Serafina Mitchell, MD;  Location: [place] [facility];  Service: Cardiovascular;  Laterality: N/A;  rt. arm   . ABDOMINAL HERNIA REPAIR  2000    x 2  . APPLICATION OF WOUND VAC N/A  08/23/2020   Procedure: APPLICATION OF ABDOMINAL WOUND VAC;  Surgeon: Greer Pickerel, MD;  Location: [place];  Service: General;  Laterality: N/A;  . AV FISTULA PLACEMENT Right 2007   upper arm  . AV FISTULA REPAIR Right ~ 03/2016 & 04/2016  . BLADDER AUGMENTATION  1975  . COLONOSCOPY    . COLONOSCOPY WITH PROPOFOL N/A 08/22/2020   Procedure: COLONOSCOPY WITH PROPOFOL;  Surgeon: Jackquline Denmark, MD;  Location: [facility] ENDOSCOPY;  Service: Endoscopy;  Laterality: N/A;  . CORONARY ANGIOPLASTY WITH STENT PLACEMENT  1999  . CYSTOSCOPY W/ URETERAL STENT PLACEMENT N/A 10/13/2017   Procedure: CYSTOSCOPY WITH IRRIGATION OF BLADDER PLACEMENT OF FOLEY;  Surgeon: Ceasar Mons, MD;  Location: [place];  Service: Urology;  Laterality: N/A;  . CYSTOSCOPY WITH FULGERATION N/A 03/27/2018   Procedure: [place] AND MUCOUS EVACUATION, BLADDER BIOPSY;  Surgeon: Franchot Gallo, MD;  Location: [facility];  Service: Urology;  Laterality: N/A;  . ESOPHAGOGASTRODUODENOSCOPY    . ESOPHAGOGASTRODUODENOSCOPY N/A 08/24/2020   Procedure: ESOPHAGOGASTRODUODENOSCOPY (EGD);  Surgeon: Jackquline Denmark, MD;  Location: [facility]COPY;  Service: Endoscopy;  Laterality: N/A;  . EXTRACORPOREAL SHOCK WAVE LITHOTRIPSY    . FISTULA SUPERFICIALIZATION Right 12/04/8919   Procedure: PLICATION OF  ANEURYSM OF ARTERIOVENOUS FISTULA;  Surgeon: Angelia Mould, MD;  Location: [place];  Service: Vascular;  Laterality: Right;  . HERNIA REPAIR    . INGUINAL HERNIA REPAIR Right 1975  . INGUINAL HERNIA REPAIR Left 03/04/2019   Procedure: LEFT OPEN INGUINAL HERNIA REPAIR WITH MESH;  Surgeon: Kinsinger, Arta Bruce, MD;  Location: [place];  Service: General;  Laterality: Left;  GENERAL AND LMA  . INSERTION OF DIALYSIS CATHETER Left 03/15/2020   Procedure: INSERTION OF Left Internal Jugular DIALYSIS CATHETER;  Surgeon: [name], MD;  Location: [place];  Service: Vascular;  Laterality: Left;  . IR FLUORO GUIDE CV LINE LEFT  03/11/2020   . IR FLUORO GUIDE CV LINE LEFT  08/20/2020  . IR US GUIDE VASC ACCESS LEFT  03/11/2020  . IR US GUIDE VASC ACCESS LEFT  08/20/2020  . IR US GUIDE VASC ACCESS RIGHT  08/20/2020  . IR VENO/JUGULAR RIGHT  08/20/2020  . LAPAROTOMY N/A 08/23/2020   Procedure: EXPLORATORY LAPAROTOMY;  Surgeon: Greer Pickerel, MD;  Location: [place];  Service: General;  Laterality: N/A;  . LAPAROTOMY N/A 08/25/2020   Procedure: RE EXPLORE EXPLORATORY LAPAROTOMY, WASHOUT AND CLOSURE;  Surgeon: Greer Pickerel, MD;  Location: [place];  Service: General;  Laterality: N/A;  . LEFT HEART CATH AND CORONARY ANGIOGRAPHY N/A 05/06/2020   Procedure: LEFT HEART CATH AND CORONARY ANGIOGRAPHY;  Surgeon: Nigel Mormon, MD;  Location: [place] [facility];  Service: Cardiovascular;  Laterality: N/A;  . PERIPHERAL VASCULAR BALLOON ANGIOPLASTY  11/22/2017   Procedure: PERIPHERAL VASCULAR BALLOON ANGIOPLASTY;  Surgeon: Serafina Mitchell, MD;  Location: [place] [facility];  Service: Cardiovascular;;  rt. arm fistula  . PERIPHERAL VASCULAR CATHETERIZATION N/A 06/22/2016   Procedure: Left Arm Venography;  Surgeon: Elam Dutch, MD;  Location: [place] [facility];  Service: Cardiovascular;  Laterality: N/A;  . PERIPHERAL VASCULAR CATHETERIZATION N/A 06/22/2016   Procedure: A/V Fistulagram - Right Arm;  Surgeon: Elam Dutch, MD;  Location: [place] [facility];  Service: Cardiovascular;  Laterality: N/A;  . PERIPHERAL VASCULAR CATHETERIZATION Right 06/22/2016   Procedure: Peripheral Vascular Balloon Angioplasty;  Surgeon: Elam Dutch, MD;  Location: [place] [facility];  Service: Cardiovascular;  Laterality: Right;  arm fistula  . POLYPECTOMY  08/22/2020   Procedure: POLYPECTOMY;  Surgeon: Jackquline Denmark, MD;  Location: [facility] ENDOSCOPY;  Service: Endoscopy;;  . REVISON OF ARTERIOVENOUS FISTULA Right 03/15/2020   Procedure: RIGHT ARM ARTERIOVENOUS FISTULA REVISON;  Surgeon: [name], MD;  Location: [place];  Service: Vascular;  Laterality: Right;   . SPLENECTOMY, TOTAL N/A 08/23/2020   Procedure: SPLENECTOMY;  Surgeon: Greer Pickerel, MD;  Location[facility] OR;  Service: General;  Laterality: N/A;    Social History   Socioeconomic History  . Marital status: Single    Spouse name: Not on file  . Number of children: Not on file  . Years of education: Not on file  . Highest education level: Not on file  Occupational History  . Not on file  Tobacco Use  . Smoking status: Former Smoker    Packs/day: 1.00    Years: 5.00    Pack years: 5.00    Types: Cigarettes    Quit date: 05/28/1993    Years since quitting: 27.3  . Smokeless tobacco: Never Used  Vaping Use  . Vaping Use: Never used  Substance and Sexual Activity  . Alcohol use: Not Currently    Comment:  "nothing since 1995"  . Drug  use: Not Currently    Types: Marijuana, Cocaine    Comment: denies 03/02/19  . Sexual activity: Yes  Other Topics Concern  . Not on file  Social History Narrative  . Not on file   Social Determinants of Health   Financial Resource Strain: Not on file  Food Insecurity: Not on file  Transportation Needs: Not on file  Physical Activity: Not on file  Stress: Not on file  Social Connections: Not on file  Intimate Partner Violence: Not on file    Family History  Problem Relation Age of Onset  . Cancer Mother   . Diabetes Mother   . Hypertension Mother   . Deep vein thrombosis Daughter     Current Outpatient Medications  Medication Sig Dispense Refill  . amLODipine (NORVASC) 10 MG tablet Take 1 tablet (10 mg total) by mouth daily. 30 tablet 0  . aspirin EC 81 MG tablet Take 81 mg by mouth at bedtime.     [name] atorvastatin (LIPITOR) 40 MG tablet Take 40 mg by mouth daily.    Lorin Picket 1 GM 210 MG(Fe) tablet Take 420 mg by mouth 3 (three) times daily.    . hydrALAZINE (APRESOLINE) 25 MG tablet Take 1 tablet (25 mg total) by mouth 3 (three) times daily. 90 tablet 0  . hydrOXYzine (VISTARIL) 50 MG capsule Take 50 mg by mouth daily at 6 (six) AM.     . labetalol (NORMODYNE) 100 MG tablet Take 100 mg by mouth daily.    [name] lidocaine-prilocaine (EMLA) cream Apply 1 application topically See admin instructions. Apply small amount to dialysis port (AVF) one hour before dialysis. Cover with occlusive dressing (saran wrap)  6  . LOKELMA 5 g packet Take 1 packet by mouth daily.    . methocarbamol (ROBAXIN) 750 MG tablet Take 750 mg by mouth every 8 (eight) hours as needed for muscle spasms.    [name] oxyCODONE (OXY IR/ROXICODONE) 5 MG immediate release tablet Take 5 mg by mouth 2 (two) times daily as needed for moderate pain.    [name] sucralfate (CARAFATE) 1 g tablet Take 1 g by mouth daily.    . traZODone (DESYREL) 150 MG tablet Take 150 mg by mouth daily as needed.    . Vitamin D, Ergocalciferol, (DRISDOL) 1.25 MG (50000 UNIT) CAPS capsule Take 50,000 Units by mouth once a week.    . isosorbide mononitrate (IMDUR) 30 MG 24 hr tablet Take 30 mg by mouth daily.      No current facility-administered medications for this visit.    Allergies  Allergen Reactions  . Heparin Rash    Pork products   . Pork-Derived Products Rash  . Sulfa Antibiotics Rash     REVIEW OF SYSTEMS:   [X]  denotes positive finding, [ ]  denotes negative finding Cardiac  Comments:  Chest pain or chest pressure:    Shortness of breath upon exertion:    Short of breath when lying flat:    Irregular heart rhythm:        Vascular    Pain in calf, thigh, or hip brought on by ambulation:    Pain in feet at night that wakes you up from your sleep:     Blood clot in your veins:    Leg swelling:         Pulmonary    Oxygen at home:    Productive cough:     Wheezing:         Neurologic    Sudden  weakness in arms or legs:     Sudden numbness in arms or legs:     Sudden onset of difficulty speaking or slurred speech:    Temporary loss of vision in one eye:     Problems with dizziness:         Gastrointestinal    Blood in stool:     Vomited blood:         Genitourinary     Burning when urinating:     Blood in urine:        Psychiatric    Major depression:         Hematologic    Bleeding problems:    Problems with blood clotting too easily:        Skin    Rashes or ulcers:        Constitutional    Fever or chills:      PHYSICAL EXAMINATION:  Vitals:   09/15/20 1038  BP: (!) 161/81  Pulse: 92  Resp: 20  Temp: 98.3 F (36.8 C)  TempSrc: Temporal  SpO2: 95%  Weight: 99 lb 3.3 oz (45 kg)  Height: 5\' 4"  (1.626 m)    General:  WDWN in NAD; vital signs documented above Gait: Not observed HENT: WNL, normocephalic Pulmonary: normal non-labored breathing , without Rales, rhonchi,  wheezing Cardiac: regular HR Abdomen: soft, NT, no masses Skin: with rashes Extremities: Right arm AV fistula with some hypopigmentation in the area of previously revised fistula; there is a palpable thrill throughout the fistula; he has prominent surface veins in his right chest and neck Musculoskeletal: no muscle wasting or atrophy  Neurologic: A&O X 3;  No focal weakness or paresthesias are detected Psychiatric:  The pt has Normal affect.   Non-Invasive Vascular Imaging:   Mildly elevated velocity mid fistula and upper arm    ASSESSMENT/PLAN:: 65 y.o. male here for evaluation of high venous pressures of right arm AV fistula  -Right upper arm AV fistula is patent with palpable thrill and no signs or symptoms of steal syndrome -Patient was referred to the office due to high venous pressures; on exam patient has prominent right chest and neck surface veins possibly indicating a central venous stenosis -Ok to continue HD via right arm AV fistula -Plan will be for right arm fistulogram with possible intervention at the next available date -This procedure was explained to the patient and all questions were answered and he agrees to proceed   Dagoberto Ligas, PA-C [facility] [phone]  Clinic MD:   Oneida Alar

## 2020-09-22 ENCOUNTER — Encounter: Payer: Self-pay | Admitting: Gastroenterology

## 2020-09-22 ENCOUNTER — Telehealth: Payer: Self-pay

## 2020-09-22 NOTE — Telephone Encounter (Signed)
Called patient and informed of surgery time change - 0900 instead of 0930 - patient verbalizes understanding.

## 2020-09-23 NOTE — Telephone Encounter (Signed)
Pt called to inquire about arrival time for procedure on 09/27/20. Scheduling note has pt for same day covid testing due to transportation, so pt will need to arrive 3 hours early. Attempted to reach pt back, no answer. Left voicemail advising pt, arrival time is 0800 AM.

## 2020-09-27 ENCOUNTER — Ambulatory Visit (HOSPITAL_COMMUNITY)
Admission: RE | Admit: 2020-09-27 | Discharge: 2020-09-27 | Disposition: A | Discharge status: Home / Self Care | Payer: Medicare HMO | Source: Ambulatory Visit | Attending: Surgery | Admitting: Surgery

## 2020-09-27 ENCOUNTER — Encounter (HOSPITAL_COMMUNITY)
Admission: RE | Disposition: A | Discharge status: Home / Self Care | Payer: Self-pay | Source: Ambulatory Visit | Attending: Surgery

## 2020-09-27 ENCOUNTER — Encounter (HOSPITAL_BASED_OUTPATIENT_CLINIC_OR_DEPARTMENT_OTHER): Payer: Medicare HMO | Admitting: Internal Medicine

## 2020-09-27 ENCOUNTER — Other Ambulatory Visit: Payer: Self-pay

## 2020-09-27 DIAGNOSIS — Z20822 Contact with and (suspected) exposure to covid-19: Secondary | ICD-10-CM | POA: Diagnosis not present

## 2020-09-27 DIAGNOSIS — N185 Chronic kidney disease, stage 5: Secondary | ICD-10-CM

## 2020-09-27 DIAGNOSIS — Y841 Kidney dialysis as the cause of abnormal reaction of the patient, or of later complication, without mention of misadventure at the time of the procedure: Secondary | ICD-10-CM | POA: Diagnosis not present

## 2020-09-27 DIAGNOSIS — T82858A Stenosis of vascular prosthetic devices, implants and grafts, initial encounter: Secondary | ICD-10-CM | POA: Insufficient documentation

## 2020-09-27 DIAGNOSIS — Z882 Allergy status to sulfonamides status: Secondary | ICD-10-CM | POA: Diagnosis not present

## 2020-09-27 DIAGNOSIS — Z992 Dependence on renal dialysis: Secondary | ICD-10-CM | POA: Diagnosis not present

## 2020-09-27 DIAGNOSIS — Z91014 Allergy to mammalian meats: Secondary | ICD-10-CM | POA: Diagnosis not present

## 2020-09-27 DIAGNOSIS — Z79899 Other long term (current) drug therapy: Secondary | ICD-10-CM | POA: Insufficient documentation

## 2020-09-27 DIAGNOSIS — Z888 Allergy status to other drugs, medicaments and biological substances status: Secondary | ICD-10-CM | POA: Diagnosis not present

## 2020-09-27 DIAGNOSIS — Z7982 Long term (current) use of aspirin: Secondary | ICD-10-CM | POA: Diagnosis not present

## 2020-09-27 DIAGNOSIS — N186 End stage renal disease: Secondary | ICD-10-CM | POA: Insufficient documentation

## 2020-09-27 DIAGNOSIS — I120 Hypertensive chronic kidney disease with stage 5 chronic kidney disease or end stage renal disease: Secondary | ICD-10-CM | POA: Insufficient documentation

## 2020-09-27 DIAGNOSIS — Z87891 Personal history of nicotine dependence: Secondary | ICD-10-CM | POA: Insufficient documentation

## 2020-09-27 HISTORY — PX: PERIPHERAL VASCULAR BALLOON ANGIOPLASTY: CATH118281

## 2020-09-27 LAB — POCT I-STAT, CHEM 8
BUN: 12 mg/dL (ref 8–23)
Calcium, Ion: 1.16 mmol/L (ref 1.15–1.40)
Chloride: 92 mmol/L — ABNORMAL LOW (ref 98–111)
Creatinine, Ser: 6.3 mg/dL — ABNORMAL HIGH (ref 0.61–1.24)
Glucose, Bld: 80 mg/dL (ref 70–99)
HCT: 28 % — ABNORMAL LOW (ref 39.0–52.0)
Hemoglobin: 9.5 g/dL — ABNORMAL LOW (ref 13.0–17.0)
Potassium: 3.8 mmol/L (ref 3.5–5.1)
Sodium: 138 mmol/L (ref 135–145)
TCO2: 34 mmol/L — ABNORMAL HIGH (ref 22–32)

## 2020-09-27 LAB — SARS CORONAVIRUS 2 BY RT PCR (HOSPITAL ORDER, PERFORMED IN ~~LOC~~ HOSPITAL LAB): SARS Coronavirus 2: NEGATIVE

## 2020-09-27 SURGERY — PERIPHERAL VASCULAR BALLOON ANGIOPLASTY
Anesthesia: LOCAL | Laterality: Right

## 2020-09-27 MED ORDER — FENTANYL CITRATE (PF) 100 MCG/2ML IJ SOLN
INTRAMUSCULAR | Status: AC
  Filled 2020-09-27: qty 2

## 2020-09-27 MED ORDER — MIDAZOLAM HCL 2 MG/2ML IJ SOLN
INTRAMUSCULAR | Status: AC
  Filled 2020-09-27: qty 2

## 2020-09-27 MED ORDER — LIDOCAINE HCL (PF) 1 % IJ SOLN
INTRAMUSCULAR | Status: AC
  Filled 2020-09-27: qty 30

## 2020-09-27 MED ORDER — HEPARIN (PORCINE) IN NACL 1000-0.9 UT/500ML-% IV SOLN: INTRAVENOUS | Status: DC | PRN

## 2020-09-27 MED ORDER — IODIXANOL 320 MG/ML IV SOLN
INTRAVENOUS | Status: DC | PRN
  Administered 2020-09-27: 100 mL via INTRAVENOUS

## 2020-09-27 MED ORDER — FENTANYL CITRATE (PF) 100 MCG/2ML IJ SOLN
INTRAMUSCULAR | Status: DC | PRN
  Administered 2020-09-27: 25 ug via INTRAVENOUS

## 2020-09-27 MED ORDER — LIDOCAINE HCL (PF) 1 % IJ SOLN
INTRAMUSCULAR | Status: DC | PRN
  Administered 2020-09-27: 5 mL via INTRADERMAL

## 2020-09-27 MED ORDER — MIDAZOLAM HCL 2 MG/2ML IJ SOLN
INTRAMUSCULAR | Status: DC | PRN
  Administered 2020-09-27: 1 mg via INTRAVENOUS

## 2020-09-27 SURGICAL SUPPLY — 20 items
BALLN ATHLETIS 12X40X75 (BALLOONS) ×3
BALLN ATLAS 14X40X75 (BALLOONS) ×3
BALLN MUSTANG 10.0X40 75 (BALLOONS) ×6
BALLN MUSTANG 12X60X75 (BALLOONS) ×3
BALLOON ATHLETIS 12X40X75 (BALLOONS) ×2 IMPLANT
BALLOON ATLAS 14X40X75 (BALLOONS) ×2 IMPLANT
BALLOON MUSTANG 10.0X40 75 (BALLOONS) ×4 IMPLANT
BALLOON MUSTANG 12X60X75 (BALLOONS) ×2 IMPLANT
COVER DOME SNAP 22 D (MISCELLANEOUS) ×3 IMPLANT
GUIDEWIRE ANGLED .035X150CM (WIRE) ×3 IMPLANT
KIT ENCORE 26 ADVANTAGE (KITS) ×3 IMPLANT
KIT MICROPUNCTURE NIT STIFF (SHEATH) ×6 IMPLANT
PROTECTION STATION PRESSURIZED (MISCELLANEOUS) ×3
SHEATH PINNACLE R/O II 7F 4CM (SHEATH) ×6 IMPLANT
SHEATH PROBE COVER 6X72 (BAG) ×3 IMPLANT
STATION PROTECTION PRESSURIZED (MISCELLANEOUS) ×2 IMPLANT
STOPCOCK MORSE 400PSI 3WAY (MISCELLANEOUS) ×3 IMPLANT
TRAY PV CATH (CUSTOM PROCEDURE TRAY) ×3 IMPLANT
TUBING CIL FLEX 10 FLL-RA (TUBING) ×3 IMPLANT
WIRE STARTER BENTSON 035X150 (WIRE) ×3 IMPLANT

## 2020-09-27 NOTE — Op Note (Signed)
    Patient name: Logan Martin MRN: [id number] DOB: 1956-01-15 Sex: male  09/27/2020 Pre-operative Diagnosis: ESRD Post-operative diagnosis:  Same Surgeon:  Annamarie Major Procedure Performed:  1.  Ultrasound-guided access left cephalic vein fistula x2  2.  Fistulogram  3.  Central venoplasty (right subclavian and innominate vein)  4.  Peripheral venoplasty (cephalic vein)  5.  Conscious sedation, 57 minutes     Indications: Patient is having difficulty with dialysis.  He comes in today for further evaluation  Procedure:  The patient was identified in the holding area and taken to room 8.  The patient was then placed supine on the table and prepped and draped in the usual sterile fashion.  A time out was called.  Conscious sedation was administered with the use of IV fentanyl and Versed under continuous physician and nurse monitoring.  Heart rate, blood pressure, and oxygen saturations were continuously monitored.  Total sedation time was 57 minutes ultrasound was used to evaluate the fistula.  The vein was patent and compressible.  A digital ultrasound image was acquired.  The fistula was then accessed under ultrasound guidance using a micropuncture needle.  An 018 wire was then asvanced without resistance and a micropuncture sheath was placed.  Contrast injections were then performed through the sheath.  Findings: Stents are visualized within the central venous system within the subclavian and innominate vein.  The proximal portion of the stent, closest to the heart just beyond this there is a 70% stenosis.  There is also narrowing between the overlaps of 2 stents.  The central venous system is otherwise without abnormality.  The fistula in the upper arm has 2 areas of narrowing and is very tortuous.  The arterial venous anastomosis is widely patent   Intervention: After obtaining these images, decision made to proceed with intervention.  The patient has a heparin allergy and so no heparin was  given.  A 7 French sheath was placed.  I first used a 12 x 40 Mustang balloon however this burst when performing angioplasty on the proximal portion of the innominate stent.  I then inserted a 12 x 40 Athletis and perform venoplasty in the same area and then ultimately a 14 x 40 Atlas balloon was utilized to perform venoplasty of the stents and the proximal stenosis.  Completion imaging showed significant improvement in blood flow to this area with residual stenosis less than 20%.  Because I had stuck up near the axilla because of the tortuous fistula, I removed this access and closed it with a Monocryl.  I then again a second access under ultrasound guidance of the cephalic vein fistula just above the antecubital crease.  I placed a 7 [place] sheath here and then treated the 2 stenotic cephalic vein areas with a 9 x 40 Mustang balloon.  Residual stenosis was less than 10%.  The sheath was removed and the venotomy site closed with a Monocryl stitch  Impression:  #1  Stenosis on the proximal edge of the prior placed central venous stents, successfully treated using a 14 x 40 balloon  #2  2 areas of stenosis within a tortuous cephalic vein fistula successfully treated with a 10 mm Mustang balloon  #3  The fistula remains amenable to percutaneous intervention and is ready for use   V. Annamarie Major, M.D., [facility] [place] Office: [phone] Pager:  [phone]

## 2020-09-27 NOTE — Discharge Instructions (Signed)
Dialysis Fistulogram, Care After The following information offers guidance on how to care for yourself after your procedure. Your health care provider may also give you more specific instructions. If you have problems or questions, contact your health care provider. What can I expect after the procedure? After the procedure, it is common to have:  A small amount of discomfort in the area where the small tube (catheter) was placed for the procedure.  A small amount of bruising around the fistula.  Sleepiness and tiredness (fatigue). Follow these instructions at home: Puncture site care  Follow instructions from your health care provider about how to take care of the site where catheters were inserted. Make sure you: ? Wash your hands with soap and water for at least 20 seconds before and after you change your bandage (dressing). If soap and water are not available, use hand sanitizer. ? Change your dressing as told by your health care provider. ? Leave stitches (sutures), skin glue, or adhesive strips in place. These skin closures may need to stay in place for 2 weeks or longer. If adhesive strip edges start to loosen and curl up, you may trim the loose edges. Do not remove adhesive strips completely unless your health care provider tells you to do that.  Check your puncture area every day for signs of infection. Check for: ? More redness, swelling, or pain. ? Fluid or blood. ? Warmth. ? Pus or a bad smell.   Activity  Rest as much as you can.  If you were given a sedative during the procedure, it can affect you for several hours. Do not drive or operate machinery until your health care provider says that it is safe.  Do not lift anything that is heavier than 5 lb (2.3 kg), or the limit that you are told, on the day of your procedure.  Do not do anything strenuous with your arm for the rest of the day. Avoid household activities, such as vacuuming.  Return to your normal activities as  told by your health care provider. Ask your health care provider what activities are safe for you. Safety To prevent damage to your graft or fistula:  Do not wear tight-fitting clothing or jewelry on the arm or leg that has your graft or fistula.  Tell all your health care providers that you have a dialysis fistula or graft.  Do not allow blood draws, IVs, or blood pressure readings to be done in the arm that has your fistula or graft.  Do not allow flu shots or vaccinations in the arm with your fistula or graft. General instructions  Take over-the-counter and prescription medicines only as told by your health care provider.  Do not take baths, swim, or use a hot tub until your health care provider approves. Ask your health care provider if you may take showers. You may only be allowed to take sponge baths.  Monitor your dialysis fistula closely. Check to make sure that you can feel a vibration or buzz (a thrill) when you put your fingers over the fistula.  Keep all follow-up visits. This is important. Contact a health care provider if:  You have more redness, swelling, or pain at the site where the catheter was put in.  You have fluid or blood coming from the catheter site.  You have pus or a bad smell coming from the catheter site.  Your catheter site feels warm.  You have a fever or chills. Get help right away if:    You have bleeding from the vascular access site that does not stop.  You feel weak.  You have trouble balancing.  You have trouble moving your arms or legs.  You have problems with your speech or vision.  You can no longer feel a vibration or buzz when you put your fingers over your fistula.  The limb that was used for the procedure swells or becomes painful, cold, blue, or pale white.  You have chest pain or shortness of breath. These symptoms may represent a serious problem that is an emergency. Do not wait to see if the symptoms will go away. Get  medical help right away. Call your local emergency services (911 in the [place] Do not drive yourself to the hospital. Summary  After a dialysis fistulogram, it is common to have a small amount of discomfort or bruising in the area where the small, thin tube (catheter) was placed.  Rest as much as you can after your procedure. Return to your normal activities as told by your health care provider.  Take over-the-counter and prescription medicines only as told by your health care provider.  Follow instructions from your health care provider about how to take care of the site where the catheter was inserted.  Keep all follow-up visits. This is important. This information is not intended to replace advice given to you by your health care provider. Make sure you discuss any questions you have with your health care provider. Document Revised: 12/23/2019 Document Reviewed: 12/23/2019 Elsevier Patient Education  2021 Elsevier Inc.  

## 2020-09-27 NOTE — Interval H&P Note (Signed)
History and Physical Interval Note:  09/27/2020 9:41 PM  Logan Martin  has presented today for surgery, with the diagnosis of END STAGE RENAL.  The various methods of treatment have been discussed with the patient and family. After consideration of risks, benefits and other options for treatment, the patient has consented to  Procedure(s) with comments: PERIPHERAL VASCULAR BALLOON ANGIOPLASTY (Right) - arm fistula as a surgical intervention.  The patient's history has been reviewed, patient examined, no change in status, stable for surgery.  I have reviewed the patient's chart and labs.  Questions were answered to the patient's satisfaction.     Annamarie Major

## 2020-09-28 ENCOUNTER — Encounter (HOSPITAL_COMMUNITY): Payer: Self-pay | Admitting: Surgery

## 2020-09-30 ENCOUNTER — Encounter (HOSPITAL_COMMUNITY): Payer: Self-pay | Admitting: Surgery

## 2020-10-11 ENCOUNTER — Other Ambulatory Visit: Payer: Self-pay

## 2020-10-11 ENCOUNTER — Encounter (HOSPITAL_BASED_OUTPATIENT_CLINIC_OR_DEPARTMENT_OTHER): Payer: Medicare HMO | Attending: Internal Medicine | Admitting: Internal Medicine

## 2020-10-11 DIAGNOSIS — I509 Heart failure, unspecified: Secondary | ICD-10-CM | POA: Diagnosis not present

## 2020-10-11 DIAGNOSIS — I132 Hypertensive heart and chronic kidney disease with heart failure and with stage 5 chronic kidney disease, or end stage renal disease: Secondary | ICD-10-CM | POA: Insufficient documentation

## 2020-10-11 DIAGNOSIS — Z9081 Acquired absence of spleen: Secondary | ICD-10-CM | POA: Insufficient documentation

## 2020-10-11 DIAGNOSIS — I25119 Atherosclerotic heart disease of native coronary artery with unspecified angina pectoris: Secondary | ICD-10-CM | POA: Diagnosis not present

## 2020-10-11 DIAGNOSIS — Z8551 Personal history of malignant neoplasm of bladder: Secondary | ICD-10-CM | POA: Diagnosis not present

## 2020-10-11 DIAGNOSIS — I739 Peripheral vascular disease, unspecified: Secondary | ICD-10-CM | POA: Diagnosis not present

## 2020-10-11 DIAGNOSIS — Z992 Dependence on renal dialysis: Secondary | ICD-10-CM | POA: Insufficient documentation

## 2020-10-11 DIAGNOSIS — Z87891 Personal history of nicotine dependence: Secondary | ICD-10-CM | POA: Insufficient documentation

## 2020-10-11 DIAGNOSIS — N186 End stage renal disease: Secondary | ICD-10-CM

## 2020-10-11 DIAGNOSIS — Z882 Allergy status to sulfonamides status: Secondary | ICD-10-CM | POA: Insufficient documentation

## 2020-10-11 DIAGNOSIS — L89303 Pressure ulcer of unspecified buttock, stage 3: Secondary | ICD-10-CM | POA: Diagnosis present

## 2020-10-11 NOTE — Progress Notes (Signed)
Logan Martin, Logan Martin ([id number]) Visit Report for 10/11/2020 Abuse/Suicide Risk Screen Details Patient Name: Date of Service: Logan NES, [name] 10/11/2020 7:30 A M Medical Record Number: [id number] Patient Account Number: [id number] Date of Birth/Sex: Treating RN: 02/21/1956 (64 y.o. Male) Logan Martin Primary Care Logan Martin: Logan Martin Other Clinician: Referring Logan Martin: Treating Logan Martin/Extender: Logan Martin in Treatment: 0 Abuse/Suicide Risk Screen Items Answer ABUSE RISK SCREEN: Has anyone close to you tried to hurt or harm you recentlyo No Do you feel uncomfortable with anyone in your familyo No Has anyone forced you do things that you didnt want to doo No Electronic Signature(s) Signed: 10/11/2020 5:35:12 PM By: Logan Gouty RN, BSN Entered By: Logan Martin on 10/11/2020 08:01:58 -------------------------------------------------------------------------------- Activities of Daily Living Details Patient Name: Date of Service: Logan NES, [name] 10/11/2020 7:30 A M Medical Record Number: [id number] Patient Account Number: [id number] Date of Birth/Sex: Treating RN: 1955/07/10 (64 y.o. Male) Logan Martin Primary Care Stellarose Cerny: Logan Martin Other Clinician: Referring [name]: Treating Logan Martin/Extender: Logan Martin in Treatment: 0 Activities of Daily Living Items Answer Activities of Daily Living (Please select one for each item) Drive Automobile Not Able T Medications ake Completely Able Use T elephone Completely Able Care for Appearance Completely Able Use T oilet Completely Able Bath / Shower Completely Able Dress Self Completely Able Feed Self Completely Able Walk Need Assistance Get In / Out Bed Need Assistance Housework Need Assistance Prepare Meals Completely [place] for Self Need Assistance Electronic Signature(s) Signed: 10/11/2020 5:35:12 PM By:  Logan Gouty RN, BSN Entered By: Logan Martin on 10/11/2020 08:03:00 -------------------------------------------------------------------------------- Education Screening Details Patient Name: Date of Service: Logan NES, [name] 10/11/2020 7:30 A M Medical Record Number: [id number] Patient Account Number: [id number] Date of Birth/Sex: Treating RN: April 03, 1956 (64 y.o. Male) Logan Martin Primary Care Matix Henshaw: Logan Martin Other Clinician: Referring [name]: Treating Logan Martin/Extender: Logan Martin in Treatment: 0 Primary Learner Assessed: Patient Learning Preferences/Education Level/Primary Language Learning Preference: Explanation, Demonstration, Printed Material Highest Education Level: College or Above Preferred Language: English Cognitive Barrier Language Barrier: No Translator Needed: No Memory Deficit: No Emotional Barrier: No Cultural/Religious Beliefs Affecting Medical Care: Yes no pork products Physical Barrier Impaired Vision: Yes Glasses Impaired Hearing: Yes Hearing Aid, bilateral Decreased Hand dexterity: No Knowledge/Comprehension Knowledge Level: High Comprehension Level: High Ability to understand written instructions: High Ability to understand verbal instructions: High Motivation Anxiety Level: Calm Cooperation: Cooperative Education Importance: Acknowledges Need Interest in Health Problems: Asks Questions Perception: Coherent Willingness to Engage in Self-Management High Activities: Readiness to Engage in Self-Management High Activities: Electronic Signature(s) Signed: 10/11/2020 5:35:12 PM By: Logan Gouty RN, BSN Entered By: Logan Martin on 10/11/2020 08:04:06 -------------------------------------------------------------------------------- Fall Risk Assessment Details Patient Name: Date of Service: Logan NES, [name] 10/11/2020 7:30 A M Medical Record Number: [id number] Patient Account Number:  [id number] Date of Birth/Sex: Treating RN: [birth year] (64 y.o. Male) Logan Martin Primary Care [name]: Logan Martin Other Clinician: Referring [name]: Treating [name]/Extender: Logan Martin in Treatment: 0 Fall Risk Assessment Items Have you had 2 or more falls in the last 12 monthso 0 Yes Have you had any fall that resulted in injury in the last 12 monthso 0 No FALLS RISK SCREEN History of falling - immediate or within 3 months 0 No Secondary diagnosis (Do you have 2 or more medical diagnoseso) 15 Yes Ambulatory aid None/bed rest/wheelchair/nurse 0 No Crutches/cane/walker 15 Yes Furniture 0 No Intravenous  therapy Access/Saline/Heparin Lock 0 No Gait/Transferring Normal/ bed rest/ wheelchair 0 Yes Weak (short steps with or without shuffle, stooped but able to lift head while walking, may seek 0 No support from furniture) Impaired (short steps with shuffle, may have difficulty arising from chair, head down, impaired 0 No balance) Mental Status Oriented to own ability 0 Yes Electronic Signature(s) Signed: 10/11/2020 5:35:12 PM By: Logan Gouty RN, BSN Entered By: Logan Martin on 10/11/2020 08:04:49 -------------------------------------------------------------------------------- Foot Assessment Details Patient Name: Date of Service: Logan NES, [name] 10/11/2020 7:30 A M Medical Record Number: [id number] Patient Account Number: [id number] Date of Birth/Sex: Treating RN: 1955/11/14 (64 y.o. Male) Logan Martin Primary Care [name]: Logan Martin Other Clinician: Referring [name]: Treating Venesha Petraitis/Extender: Logan Martin in Treatment: 0 Foot Assessment Items Site Locations + = Sensation present, - = Sensation absent, C = Callus, U = Ulcer R = Redness, W = Warmth, M = Maceration, PU = Pre-ulcerative lesion F = Fissure, S = Swelling, D = Dryness Assessment Right: Left: Other Deformity: No  No Prior Foot Ulcer: No No Prior Amputation: No No Charcot Joint: No No Ambulatory Status: Ambulatory With Help Assistance Device: Walker Gait: Steady Electronic Signature(s) Signed: 10/11/2020 5:35:12 PM By: Logan Gouty RN, BSN Entered By: Logan Martin on 10/11/2020 08:05:33 -------------------------------------------------------------------------------- Nutrition Risk Screening Details Patient Name: Date of Service: Logan NES, [name] 10/11/2020 7:30 A M Medical Record Number: [id number] Patient Account Number: [id number] Date of Birth/Sex: Treating RN: 22-Dec-1955 (64 y.o. Male) Logan Martin Primary Care Rindi Beechy: Logan Martin Other Clinician: Referring Veronda Gabor: Treating Shigeo Baugh/Extender: Logan Martin in Treatment: 0 Height (in): 64 Weight (lbs): 108 Body Mass Index (BMI): 18.5 Nutrition Risk Screening Items Score Screening NUTRITION RISK SCREEN: I have an illness or condition that made me change the kind and/or amount of food I eat 0 No I eat fewer than two meals per day 0 No I eat few fruits and vegetables, or milk products 0 No I have three or more drinks of beer, liquor or wine almost every day 0 No I have tooth or mouth problems that make it hard for me to eat 0 No I don't always have enough money to buy the food I need 0 No I eat alone most of the time 0 No I take three or more different prescribed or over-the-counter drugs a day 1 Yes Without wanting to, I have lost or gained 10 pounds in the last six months 0 No I am not always physically able to shop, cook and/or feed myself 0 No Nutrition Protocols Good Risk Protocol 0 No interventions needed Moderate Risk Protocol High Risk Proctocol Risk Level: Good Risk Score: 1 Electronic Signature(s) Signed: 10/11/2020 5:35:12 PM By: Logan Gouty RN, BSN Entered By: Logan Martin on 10/11/2020 08:05:18

## 2020-10-11 NOTE — Progress Notes (Signed)
CHRSITOPHER, WIK ([id number]) Visit Report for 10/11/2020 Chief Complaint Document Details Patient Name: Date of Service: [place] NES, [place] 10/11/2020 7:30 A M Medical Record Number: [id number] Patient Account Number: [id number] Date of Birth/Sex: Treating RN: 1955-08-12 (64 y.o. Male) Rhae Hammock Primary Care Provider: [name] Other Clinician: Referring Provider: Treating Provider/Extender: [name] in Treatment: 0 Information Obtained from: Patient Chief Complaint Right buttocks wound Electronic Signature(s) Signed: 10/11/2020 9:01:00 AM By: Kalman Shan DO Entered By: Kalman Shan on 10/11/2020 08:54:30 -------------------------------------------------------------------------------- Debridement Details Patient Name: Date of Service: [name], [name] 10/11/2020 7:30 A M Medical Record Number: [id number] Patient Account Number: [id number] Date of Birth/Sex: Treating RN: [birth year] (64 y.o. Male) Rhae Hammock Primary Care Provider: [name] Other Clinician: Referring Provider: Treating Provider/Extender: [name] in Treatment: 0 Debridement Performed for Assessment: Wound #1 Right Gluteus Performed By: Physician Kalman Shan, DO Debridement Type: Debridement Level of Consciousness (Pre-procedure): Awake and Alert Pre-procedure Verification/Time Out No Taken: T Area Debrided (L x W): otal 3.5 (cm) x 7 (cm) = 24.5 (cm) Tissue and other material debrided: Viable, Non-Viable, Skin: Dermis , Skin: Epidermis, Hyper-granulation Level: Skin/Epidermis Debridement Description: Selective/Open Wound Instrument: Other : gauze and ns Bleeding: Minimum Hemostasis Achieved: Pressure Response to Treatment: Procedure was tolerated well Level of Consciousness (Post- Awake and Alert procedure): Post Debridement Measurements of Total Wound Length: (cm) 3.5 Stage: Category/Stage  II Width: (cm) 7 Depth: (cm) 0.1 Volume: (cm) 1.924 Character of Wound/Ulcer Post Debridement: Improved Post Procedure Diagnosis Same as Pre-procedure Electronic Signature(s) Signed: 10/11/2020 9:01:00 AM By: Kalman Shan DO Signed: 10/11/2020 5:09:03 PM By: Rhae Hammock RN Entered By: Rhae Hammock on 10/11/2020 08:50:17 -------------------------------------------------------------------------------- HPI Details Patient Name: Date of Service: [name], [name] 10/11/2020 7:30 A M Medical Record Number: [id number] Patient Account Number: [id number] Date of Birth/Sex: Treating RN: January 11, 1956 (64 y.o. Male) Rhae Hammock Primary Care Provider: [name] Other Clinician: Referring Provider: Treating Provider/Extender: [name] in Treatment: 0 History of Present Illness HPI Description: Mr. 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 While in the ICU he developed a pressure ulcer that affected his right and left buttocks. He states today that the left buttocks wound has healed and the right wound remains but has improved in size. He denies pain. He states that he is mobile with his walker. He denies signs of infection. Electronic Signature(s) Signed: 10/11/2020 9:01:00 AM By: Kalman Shan DO Entered By: Kalman Shan on 10/11/2020 08:55:10 -------------------------------------------------------------------------------- Physical Exam Details Patient Name: Date of Service: JO  NES, [place] L. 10/11/2020 7:30 A M Medical Record Number: [id number] Patient Account Number: [id number] Date of Birth/Sex: Treating RN: 04/05/1956 (64 y.o. Male) Rhae Hammock Primary Care Provider: [name] Other Clinician: Referring Provider: Treating Provider/Extender: [name] in Treatment: 0 Constitutional respirations regular, non-labored and within target range for patient.[name] Psychiatric pleasant and cooperative. Notes Right buttocks wound: Hyper granulated tissue throughout the wound bed. No undermining or tunneling. No signs of infection. There is scant nonviable tissue. Electronic Signature(s) Signed: 10/11/2020 9:01:00 AM By:  Kalman Shan DO Entered By: Kalman Shan on 10/11/2020 08:56:59 -------------------------------------------------------------------------------- Physician Orders Details Patient Name: Date of Service: [facility] NES, [name] 10/11/2020 7:30 A M Medical Record Number: [id number] Patient Account Number: [id number] Date of Birth/Sex: Treating RN: [birth year] (64 y.o. Male) Rhae Hammock Primary Care Provider: [name] Other Clinician: Referring Provider: Treating Provider/Extender: [name] in Treatment: 0 Verbal / Phone Orders: No Diagnosis Coding ICD-10 Coding Code Description L89.303 Pressure ulcer of unspecified buttock, stage 3 N18.6 End stage renal disease I73.9 Peripheral vascular disease, unspecified I25.119 Atherosclerotic heart disease of native coronary artery with unspecified angina pectoris Follow-up Appointments ppointment in 1 week. - Dr. Heber [place] Return A Bathing/ Shower/ Hygiene May shower with protection but do not get wound dressing(s) wet. - May shower on days dressing is changed. Off-Loading Turn and reposition every 2 hours [place] wound care orders this week; continue Home Health for wound care. May utilize formulary  equivalent dressing for wound treatment orders unless otherwise specified. Alvis Lemmings to change twice a week. [place] once a week. Wound Treatment Wound #1 - Gluteus Wound Laterality: Right Cleanser: Soap and Water [facility]) Every Other Day/15 Days Discharge Instructions: May shower and wash wound with dial antibacterial soap and water prior to dressing change. Cleanser: Wound Cleanser [facility]) Every Other Day/15 Days Discharge Instructions: Cleanse the wound with wound cleanser prior to applying a clean dressing using gauze sponges, not tissue or cotton balls. Prim Dressing: Hydrofera Blue Ready Foam, 4x5 in [facility]) Every Other Day/15 Days ary Discharge Instructions: Apply to wound bed as instructed Secondary Dressing: Zetuvit Plus Silicone Border Dressing 7x7(in/in) ([place]) Every Other Day/15 Days Discharge Instructions: Apply silicone border over primary dressing as directed. Electronic Signature(s) Signed: 10/11/2020 9:01:00 AM By: Kalman Shan DO Entered By: Kalman Shan on 10/11/2020 08:57:20 -------------------------------------------------------------------------------- Problem List Details Patient Name: Date of Service: [name], [name] 10/11/2020 7:30 A M Medical Record Number: [id number] Patient Account Number: [id number] Date of Birth/Sex: Treating RN: 09/03/1955 (64 y.o. Male) Rhae Hammock Primary Care Provider: [name] Other Clinician: Referring Provider: Treating Provider/Extender: [name] in Treatment: 0 Active Problems ICD-10 Encounter Code Description Active Date MDM Diagnosis L89.303 Pressure ulcer of unspecified buttock, stage 3 10/11/2020 No Yes N18.6 End stage renal disease 10/11/2020 No Yes I73.9 Peripheral vascular disease, unspecified 10/11/2020 No Yes I25.119 Atherosclerotic heart disease of native coronary artery with unspecified angina 10/11/2020 No Yes pectoris Inactive  Problems Resolved Problems Electronic Signature(s) Signed: 10/11/2020 9:01:00 AM By: Kalman Shan DO Entered By: Kalman Shan on 10/11/2020 08:54:08 -------------------------------------------------------------------------------- Progress Note Details Patient Name: Date of Service: [name], [name] 10/11/2020 7:30 A M Medical Record Number: [id number] Patient Account Number: [id number] Date of Birth/Sex: Treating RN: 1955-08-02 (64 y.o. Male) Rhae Hammock Primary Care Provider: [name] Other Clinician: Referring Provider: Treating Provider/Extender: [name] in Treatment: 0 Subjective Chief Complaint Information obtained from Patient Right buttocks wound History of Present Illness (HPI) Mr. 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 While in the ICU he developed a pressure ulcer  that affected his right and left buttocks. He states today that the left buttocks wound has healed and the right wound remains but has improved in size. He denies pain. He states that he is mobile with his walker. He denies signs of infection. Patient History Information obtained from Patient. Allergies Sulfa (Sulfonamide Antibiotics) (Reaction: rash), gelatin, pork (Reaction: nausea) Family History Cancer - Mother, Hypertension - Mother, No family history of Diabetes, Heart Disease, Hereditary Spherocytosis, Kidney Disease, Lung Disease, Seizures, Stroke, Thyroid Problems, Tuberculosis. Social  History Former smoker - quit 1995, Marital Status - Single, Alcohol Use - Never, Drug Use - Prior History - quit 1995 [place], cocaine, Caffeine Use - Daily - coffee. Medical History Eyes Denies history of Cataracts, Glaucoma, Optic Neuritis Ear/Nose/Mouth/Throat Denies history of Chronic sinus problems/congestion, Middle ear problems Cardiovascular Patient has history of Congestive Heart Failure, Hypertension Gastrointestinal Patient has history of Hepatitis C - hx Endocrine Denies history of Type I Diabetes, Type II Diabetes Genitourinary Patient has history of End Stage Renal Disease - HD Integumentary (Skin) Denies history of History of Burn Musculoskeletal Denies history of Gout, Rheumatoid Arthritis, Osteoarthritis, Osteomyelitis Oncologic Denies history of Received Chemotherapy, Received Radiation Psychiatric Denies history of Anorexia/bulimia, Confinement Anxiety Hospitalization/Surgery History - AV fistula placed. - splenectomy. - fistulagram. - bladder removed. - hernia repair. - TURP. - laparotomy. Medical A Surgical History Notes nd Cardiovascular cardiomegaly Musculoskeletal chronic lower back pain Oncologic bladder cancer Review of Systems (ROS) Constitutional Symptoms (General Health) Denies complaints or symptoms of Fatigue, Fever, Chills, Marked Weight Change. Eyes Complains or has symptoms of Glasses / Contacts. Denies complaints or symptoms of Dry Eyes, Vision Changes. Ear/Nose/Mouth/Throat Denies complaints or symptoms of Chronic sinus problems or rhinitis. Respiratory Denies complaints or symptoms of Chronic or frequent coughs, Shortness of Breath. Cardiovascular Denies complaints or symptoms of Chest pain. Endocrine Denies complaints or symptoms of Heat/cold intolerance. Integumentary (Skin) Complains or has symptoms of Wounds - buttocks. Musculoskeletal Complains or has symptoms of Muscle Weakness. Denies complaints or symptoms of Muscle  Pain. Neurologic Denies complaints or symptoms of Numbness/parasthesias. Psychiatric Denies complaints or symptoms of Claustrophobia, Suicidal. Objective Constitutional respirations regular, non-labored and within target range for patient.. Vitals Time Taken: 7:49 AM, Height: 64 in, Source: Stated, Weight: 108 lbs, Source: Stated, BMI: 18.5, Temperature: 99.1 F, Pulse: 93 bpm, Respiratory Rate: 18 breaths/min, Blood Pressure: 145/77 mmHg. Psychiatric pleasant and cooperative. General Notes: Right buttocks wound: Hyper granulated tissue throughout the wound bed. No undermining or tunneling. No signs of infection. There is scant nonviable tissue. Integumentary (Hair, Skin) Wound #1 status is Open. Original cause of wound was Pressure Injury. The date acquired was: 08/14/2020. The wound is located on the Right Gluteus. The wound measures 3.5cm length x 7cm width x 0.1cm depth; 19.242cm^2 area and 1.924cm^3 volume. There is Fat Layer (Subcutaneous Tissue) exposed. There is no tunneling or undermining noted. There is a medium amount of serosanguineous drainage noted. The wound margin is distinct with the outline attached to the wound base. There is medium (34-66%) red granulation within the wound bed. There is a medium (34-66%) amount of necrotic tissue within the wound bed including Adherent Slough. Assessment Active Problems ICD-10 Pressure ulcer of unspecified buttock, stage 3 End stage renal disease Peripheral vascular disease, unspecified Atherosclerotic heart disease of native coronary artery with unspecified angina pectoris Patient presents with a stage III right buttocks wound that he reports occurred while in the ICU. At this time we will try Hydrofera Blue dressing to see if this  helps with the hypergranulation. If not I will use silver nitrate at future visits to get this down. No signs of infection. We also discussed the importance of offloading. Patient is mobile and should be  able to aggressively offload this. We will see him back in a week Procedures Wound #1 Pre-procedure diagnosis of Wound #1 is a Pressure Ulcer located on the Right Gluteus . There was a Selective/Open Wound Skin/Epidermis Debridement with a total area of 24.5 sq cm performed by Kalman Shan, DO. With the following instrument(s): gauze and ns to remove Viable and Non-Viable tissue/material. Material removed includes Skin: Dermis, Skin: Epidermis, and Hyper-granulation. No specimens were taken.A Minimum amount of bleeding was controlled with Pressure. The procedure was tolerated well. Post Debridement Measurements: 3.5cm length x 7cm width x 0.1cm depth; 1.924cm^3 volume. Post debridement Stage noted as Category/Stage II. Character of Wound/Ulcer Post Debridement is improved. Post procedure Diagnosis Wound #1: Same as Pre-Procedure Plan Follow-up Appointments: Return Appointment in 1 week. - Dr. Heber [place] Bathing/ Shower/ Hygiene: May shower with protection but do not get wound dressing(s) wet. - May shower on days dressing is changed. Off-Loading: Turn and reposition every 2 hours Home Health: New wound care orders this week; continue Home Health for wound care. May utilize formulary equivalent dressing for wound treatment orders unless otherwise specified. Alvis Lemmings to change twice a week. [place] once a week. WOUND #1: - Gluteus Wound Laterality: Right Cleanser: Soap and Water [facility]) Every Other Day/15 Days Discharge Instructions: May shower and wash wound with dial antibacterial soap and water prior to dressing change. Cleanser: Wound Cleanser [facility]) Every Other Day/15 Days Discharge Instructions: Cleanse the wound with wound cleanser prior to applying a clean dressing using gauze sponges, not tissue or cotton balls. Prim Dressing: Hydrofera Blue Ready Foam, 4x5 in [facility]) Every Other Day/15 Days ary Discharge Instructions: Apply to wound bed as instructed Secondary  Dressing: Zetuvit Plus Silicone Border Dressing 7x7(in/in) ([place]) Every Other Day/15 Days Discharge Instructions: Apply silicone border over primary dressing as directed. 1. Hydrofera Blue every other day 2. Mechanical debridement 3. Follow-up in 1 week Electronic Signature(s) Signed: 10/11/2020 9:01:00 AM By: Kalman Shan DO Entered By: Kalman Shan on 10/11/2020 08:59:10 -------------------------------------------------------------------------------- HxROS Details Patient Name: Date of Service: [name], [name] 10/11/2020 7:30 A M Medical Record Number: [id number] Patient Account Number: [id number] Date of Birth/Sex: Treating RN: July 14, 1955 (64 y.o. Male) [name] Primary Care Provider: [name] Other Clinician: Referring Provider: Treating Provider/Extender: [name] in Treatment: 0 Information Obtained From Patient Constitutional Symptoms (General Health) Complaints and Symptoms: Negative for: Fatigue; Fever; Chills; Marked Weight Change Eyes Complaints and Symptoms: Positive for: Glasses / Contacts Negative for: Dry Eyes; Vision Changes Medical History: Negative for: Cataracts; Glaucoma; Optic Neuritis Ear/Nose/Mouth/Throat Complaints and Symptoms: Negative for: Chronic sinus problems or rhinitis Medical History: Negative for: Chronic sinus problems/congestion; Middle ear problems Respiratory Complaints and Symptoms: Negative for: Chronic or frequent coughs; Shortness of Breath Cardiovascular Complaints and Symptoms: Negative for: Chest pain Medical History: Positive for: Congestive Heart Failure; Hypertension Past Medical History Notes: cardiomegaly Endocrine Complaints and Symptoms: Negative for: Heat/cold intolerance Medical History: Negative for: Type I Diabetes; Type II Diabetes Integumentary (Skin) Complaints and Symptoms: Positive for: Wounds - buttocks Medical History: Negative for:  History of Burn Musculoskeletal Complaints and Symptoms: Positive for: Muscle Weakness Negative for: Muscle Pain Medical History: Negative for: Gout; Rheumatoid Arthritis; Osteoarthritis; Osteomyelitis Past Medical History Notes: chronic lower back pain Neurologic  Complaints and Symptoms: Negative for: Numbness/parasthesias Psychiatric Complaints and Symptoms: Negative for: Claustrophobia; Suicidal Medical History: Negative for: Anorexia/bulimia; Confinement Anxiety Hematologic/Lymphatic Gastrointestinal Medical History: Positive for: Hepatitis C - hx Genitourinary Medical History: Positive for: End Stage Renal Disease - HD Immunological Oncologic Medical History: Negative for: Received Chemotherapy; Received Radiation Past Medical History Notes: bladder cancer Immunizations Pneumococcal Vaccine: Received Pneumococcal Vaccination: Yes Implantable Devices No devices added Hospitalization / Surgery History Type of Hospitalization/Surgery AV fistula placed splenectomy fistulagram bladder removed hernia repair TURP laparotomy Family and Social History Cancer: Yes - Mother; Diabetes: No; Heart Disease: No; Hereditary Spherocytosis: No; Hypertension: Yes - Mother; Kidney Disease: No; Lung Disease: No; Seizures: No; Stroke: No; Thyroid Problems: No; Tuberculosis: No; Former smoker - quit 1995; Marital Status - Single; Alcohol Use: Never; Drug Use: Prior History - quit 1995 [place], cocaine; Caffeine Use: Daily - coffee; Financial Concerns: No; Food, Clothing or Shelter Needs: No; Support System Lacking: No; Transportation Concerns: No Electronic Signature(s) Signed: 10/11/2020 9:01:00 AM By: Kalman Shan DO Signed: 10/11/2020 5:35:12 PM By: [name] RN, BSN Entered By: [name] on 10/11/2020 08:01:49 -------------------------------------------------------------------------------- SuperBill Details Patient Name: Date of Service: [name], [name]  10/11/2020 Medical Record Number: [id number] Patient Account Number: [id number] Date of Birth/Sex: Treating RN: 23-Oct-1955 (64 y.o. Male) Rhae Hammock Primary Care Provider: [name] Other Clinician: Referring Provider: Treating Provider/Extender: [name] in Treatment: 0 Diagnosis Coding ICD-10 Codes Code Description L89.303 Pressure ulcer of unspecified buttock, stage 3 N18.6 End stage renal disease I73.9 Peripheral vascular disease, unspecified I25.119 Atherosclerotic heart disease of native coronary artery with unspecified angina pectoris Facility Procedures CPT4 Code: 10932355 Description: 99213 - WOUND CARE VISIT-LEV 3 EST PT Modifier: Quantity: 1 CPT4 Code: [id number] Description: 27062 - DEBRIDE WOUND 1ST 20 SQ CM OR < ICD-10 Diagnosis Description L89.303 Pressure ulcer of unspecified buttock, stage 3 Modifier: Quantity: 1 CPT4 Code: [id number] Description: 17616 - DEBRIDE WOUND EA ADDL 20 SQ CM ICD-10 Diagnosis Description L89.303 Pressure ulcer of unspecified buttock, stage 3 Modifier: Quantity: 1 Physician Procedures : CPT4 Code Description Modifier [id number] WC PHYS LEVEL 3 NEW PT ICD-10 Diagnosis Description L89.303 Pressure ulcer of unspecified buttock, stage 3 N18.6 End stage renal disease Quantity: 1 : [id number] 62703 - WC PHYS DEBR WO ANESTH 20 SQ CM ICD-10 Diagnosis Description L89.303 Pressure ulcer of unspecified buttock, stage 3 Quantity: 1 : [id number] 82993 - WC PHYS DEBR WO ANESTH EA ADD 20 CM ICD-10 Diagnosis Description L89.303 Pressure ulcer of unspecified buttock, stage 3 Quantity: 1 Electronic Signature(s) Signed: 10/11/2020 9:01:00 AM By: Kalman Shan DO Entered By: Kalman Shan on 10/11/2020 09:00:06

## 2020-10-12 NOTE — Progress Notes (Addendum)
Logan Martin, Logan Martin ([id number]) Visit Report for 10/11/2020 Allergy List Details Patient Name: Date of Service: Logan Martin, Logan BERT L. 10/11/2020 7:30 A Martin Medical Record Number: [id number] Patient Account Number: [id number] Date of Birth/Sex: Treating RN: 07-Apr-1956 (65 y.o. Male) [name] Primary Care Jonetta Dagley: [name] Other Clinician: Referring Elyssia Strausser: Treating Timothea Bodenheimer/Extender: [name] in Treatment: 0 Allergies Active Allergies Sulfa (Sulfonamide Antibiotics) Reaction: rash gelatin, pork Reaction: nausea Allergy Notes Electronic Signature(s) Signed: 10/11/2020 5:35:12 PM By: [name] RN, BSN Entered By: [name] on 10/11/2020 07:51:04 -------------------------------------------------------------------------------- Arrival Information Details Patient Name: Date of Service: Logan Martin, Logan Martin Medical Record Number: [id number] Patient Account Number: [id number] Date of Birth/Sex: Treating RN: 1955-10-28 (65 y.o. Male) [name] Primary Care Yasser Hepp: [name] Other Clinician: Referring Markham Dumlao: Treating Mitsuo Budnick/Extender: [name] in Treatment: 0 Visit Information Patient Arrived: Wheel Chair Arrival Time: 07:43 Accompanied By: self Transfer Assistance: None Patient Identification Verified: Yes Secondary Verification Process Completed: Yes Patient Requires Transmission-Based Precautions: No Patient Has Alerts: No Electronic Signature(s) Signed: 10/11/2020 5:35:12 PM By: [name] RN, BSN Entered By: [name] on 10/11/2020 07:49:23 -------------------------------------------------------------------------------- Clinic Level of Care Assessment Details Patient Name: Date of Service: Logan Martin, Logan Martin Medical Record Number: [id number] Patient Account Number: [id number] Date of Birth/Sex: Treating RN: 1956/04/12 (65  y.o. Male) Rhae Hammock Primary Care Jefferie Holston: [name] Other Clinician: Referring Quynh Basso: Treating Tiyanna Larcom/Extender: [name] in Treatment: 0 Clinic Level of Care Assessment Items TOOL 1 Quantity Score X- 1 0 Use when EandM and Procedure is performed on INITIAL visit ASSESSMENTS - Nursing Assessment / Reassessment X- 1 20 General Physical Exam (combine w/ comprehensive assessment (listed just below) when performed on new pt. evals) X- 1 25 Comprehensive Assessment (HX, ROS, Risk Assessments, Wounds Hx, etc.) ASSESSMENTS - Wound and Skin Assessment / Reassessment X- 1 10 Dermatologic / Skin Assessment (not related to wound area) ASSESSMENTS - Ostomy and/or Continence Assessment and Care []  - 0 Incontinence Assessment and Management []  - 0 Ostomy Care Assessment and Management (repouching, etc.) PROCESS - Coordination of Care []  - 0 Simple Patient / Family Education for ongoing care X- 1 20 Complex (extensive) Patient / Family Education for ongoing care X- 1 10 Staff obtains Programmer, systems, Records, T Results / Process Orders est X- 1 10 Staff telephones HHA, Nursing Homes / Clarify orders / etc []  - 0 Routine Transfer to another Facility (non-emergent condition) []  - 0 Routine Hospital Admission (non-emergent condition) X- 1 15 New Admissions / Biomedical engineer / Ordering NPWT Apligraf, etc. , []  - 0 Emergency Hospital Admission (emergent condition) PROCESS - Special Needs []  - 0 Pediatric / Minor Patient Management []  - 0 Isolation Patient Management []  - 0 Hearing / Language / Visual special needs []  - 0 Assessment of Community assistance (transportation, D/C planning, etc.) []  - 0 Additional assistance / Altered mentation []  - 0 Support Surface(s) Assessment (bed, cushion, seat, etc.) INTERVENTIONS - Miscellaneous []  - 0 External ear exam []  - 0 Patient Transfer (multiple staff / Civil Service fast streamer / Similar  devices) []  - 0 Simple Staple / Suture removal (25 or less) []  - 0 Complex Staple / Suture removal (26 or more) []  - 0 Hypo/Hyperglycemic Management (do not check if billed separately) []  - 0 Ankle / Brachial Index (ABI) - do not check if billed separately Has the patient been seen at the hospital within the last  three years: Yes Total Score: 110 Level Of Care: New/Established - Level 3 Electronic Signature(s) Signed: 10/11/2020 5:09:03 PM By: Rhae Hammock RN Signed: 10/11/2020 5:09:03 PM By: Rhae Hammock RN Entered By: Rhae Hammock on 10/11/2020 08:50:55 -------------------------------------------------------------------------------- Encounter Discharge Information Details Patient Name: Date of Service: Logan Martin, Logan L. 10/11/2020 7:30 A Martin Medical Record Number: [id number] Patient Account Number: [id number] Date of Birth/Sex: Treating RN: 1956-02-04 (65 y.o. Male) Lorrin Jackson Primary Care Tianni Escamilla: [name] Other Clinician: Referring Tressa Maldonado: Treating Londin Antone/Extender: [name] in Treatment: 0 Encounter Discharge Information Items Discharge Condition: Stable Ambulatory Status: Wheelchair Discharge Destination: Home Transportation: Private Auto Schedule Follow-up Appointment: Yes Clinical Summary of Care: Provided on 10/11/2020 Form Type Recipient Paper Patient Patient Electronic Signature(s) Signed: 10/11/2020 8:50:12 AM By: Lorrin Jackson Entered By: Lorrin Jackson on 10/11/2020 08:50:12 -------------------------------------------------------------------------------- Lower Extremity Assessment Details Patient Name: Date of Service: Logan Martin, Greer L. 10/11/2020 7:30 A Martin Medical Record Number: [id number] Patient Account Number: [id number] Date of Birth/Sex: Treating RN: 01/15/1956 (65 y.o. Male) [name] Primary Care Gayland Nicol: [name] Other Clinician: Referring Elvina Bosch: Treating  Sylvestre Rathgeber/Extender: [name] in Treatment: 0 Electronic Signature(s) Signed: 10/11/2020 5:35:12 PM By: [name] RN, BSN Entered By: [name] on 10/11/2020 08:05:40 -------------------------------------------------------------------------------- Multi Wound Chart Details Patient Name: Date of Service: Logan Martin, Logan L. 10/11/2020 7:30 A Martin Medical Record Number: [id number] Patient Account Number: [id number] Date of Birth/Sex: Treating RN: [birth year] (64 y.o. Male) Rhae Hammock Primary Care Jaskarn Schweer: [name] Other Clinician: Referring Ajit Errico: Treating Niall Illes/Extender: [name] in Treatment: 0 Vital Signs Height(in): 64 Pulse(bpm): 93 Weight(lbs): 108 Blood Pressure(mmHg): 145/77 Body Mass Index(BMI): 19 Temperature(F): 99.1 Respiratory Rate(breaths/min): 18 Photos: [1:No Photos Right Gluteus] [N/A:N/A N/A] Wound Location: [1:Pressure Injury] [N/A:N/A] Wounding Event: [1:Pressure Ulcer] [N/A:N/A] Primary Etiology: [1:Congestive Heart Failure,] [N/A:N/A] Comorbid History: [1:Hypertension, Hepatitis C, End Stage Renal Disease 08/14/2020] [N/A:N/A] Date A cquired: [1:0] [N/A:N/A] Weeks of Treatment: [1:Open] [N/A:N/A] Wound Status: [1:3.5x7x0.1] [N/A:N/A] Measurements L x W x D (cm) [1:19.242] [N/A:N/A] A (cm) : rea [1:1.924] [N/A:N/A] Volume (cm) : [1:Category/Stage III] [N/A:N/A] Classification: [1:Medium] [N/A:N/A] Exudate A mount: [1:Serosanguineous] [N/A:N/A] Exudate Type: [1:red, brown] [N/A:N/A] Exudate Color: [1:Distinct, outline attached] [N/A:N/A] Wound Margin: [1:Medium (34-66%)] [N/A:N/A] Granulation A mount: [1:Red] [N/A:N/A] Granulation Quality: [1:Medium (34-66%)] [N/A:N/A] Necrotic A mount: [1:Fat Layer (Subcutaneous Tissue): Yes N/A] Exposed Structures: [1:Fascia: No Tendon: No Muscle: No Joint: No Bone: No Small (1-33%)] [N/A:N/A] Epithelialization:  [1:Debridement - Selective/Open Wound N/A] Debridement: [1:Skin/Epidermis] [N/A:N/A] Level: [1:24.5] [N/A:N/A] Debridement A (sq cm): [1:rea Other(gauze and ns)] [N/A:N/A] Instrument: [1:Minimum] [N/A:N/A] Bleeding: [1:Pressure] [N/A:N/A] Hemostasis A chieved: Debridement Treatment Response: Procedure was tolerated well [N/A:N/A] Post Debridement Measurements L x 3.5x7x0.1 [N/A:N/A] W x D (cm) [1:1.924] [N/A:N/A] Post Debridement Volume: (cm) [1:Category/Stage II] [N/A:N/A] Post Debridement Stage: [1:Debridement] [N/A:N/A] Treatment Notes Wound #1 (Gluteus) Wound Laterality: Right Cleanser Wound Cleanser Discharge Instruction: Cleanse the wound with wound cleanser prior to applying a clean dressing using gauze sponges, not tissue or cotton balls. Soap and Water Discharge Instruction: May shower and wash wound with dial antibacterial soap and water prior to dressing change. Peri-Wound Care Topical Primary Dressing Hydrofera Blue Ready Foam, 4x5 in Discharge Instruction: Apply to wound bed as instructed Secondary Dressing Zetuvit Plus Silicone Border Dressing 7x7(in/in) Discharge Instruction: Apply silicone border over primary dressing as directed. Secured With Compression Wrap Compression Stockings Add-Ons Electronic Signature(s) Signed: 10/11/2020 9:01:00 AM By: Kalman Shan DO Signed: 10/11/2020 5:09:03 PM By:  Rhae Hammock RN Entered By: Kalman Shan on 10/11/2020 08:54:16 -------------------------------------------------------------------------------- Multi-Disciplinary Care Plan Details Patient Name: Date of Service: Logan Martin, Logan Martin 10/11/2020 7:30 A Martin Medical Record Number: [id number] Patient Account Number: [id number] Date of Birth/Sex: Treating RN: 1956/04/17 (64 y.o. Male) Rhae Hammock Primary Care Solan Vosler: [name] Other Clinician: Referring Hortense Cantrall: Treating Amar Keenum/Extender: [name] in  Treatment: 0 Active Inactive Electronic Signature(s) Signed: 06/15/2021 12:42:14 PM By: Rhae Hammock RN Previous Signature: 10/11/2020 5:09:03 PM Version By: Rhae Hammock RN Entered By: Rhae Hammock on 12/13/2020 17:41:56 -------------------------------------------------------------------------------- Pain Assessment Details Patient Name: Date of Service: Logan Martin, Logan Martin Medical Record Number: [id number] Patient Account Number: [id number] Date of Birth/Sex: Treating RN: 02/14/1956 (64 y.o. Male) [name] Primary Care Tavonna Worthington: [name] Other Clinician: Referring Lakeeta Dobosz: Treating Tanicia Wolaver/Extender: [name] in Treatment: 0 Active Problems Location of Pain Severity and Description of Pain Patient Has Paino Yes Site Locations Pain Location: Pain in Ulcers With Dressing Change: Yes Duration of the Pain. Constant / Intermittento Constant Rate the pain. Current Pain Level: 8 Worst Pain Level: 9 Least Pain Level: 5 Character of Pain Describe the Pain: Other: stinging Pain Management and Medication Current Pain Management: Medication: Yes Other: reposition, being still Is the Current Pain Management Adequate: Adequate How does your wound impact your activities of daily livingo Sleep: No Bathing: No Appetite: No Relationship With Others: No Bladder Continence: No Emotions: No Bowel Continence: No Hobbies: No Toileting: No Dressing: No Electronic Signature(s) Signed: 10/11/2020 5:35:12 PM By: [name] RN, BSN Entered By: [name] on 10/11/2020 08:07:05 -------------------------------------------------------------------------------- Patient/Caregiver Education Details Patient Name: Date of Service: Logan Martin, Logan Martin 5/17/2022andnbsp7:30 [place] Record Number: [id number] Patient Account Number: [id number] Date of Birth/Gender: Treating RN: 1956/05/08 (64 y.o. Male)  Rhae Hammock Primary Care Physician: [name] Other Clinician: Referring Physician: Treating Physician/Extender: [name] in Treatment: 0 Education Assessment Education Provided To: Patient Education Topics Provided Basic Hygiene: Methods: Explain/Verbal Responses: Reinforcements needed Electronic Signature(s) Signed: 10/11/2020 5:09:03 PM By: Rhae Hammock RN Entered By: Rhae Hammock on 10/11/2020 08:44:10 -------------------------------------------------------------------------------- Wound Assessment Details Patient Name: Date of Service: Logan Martin, Logan Martin Medical Record Number: [id number] Patient Account Number: [id number] Date of Birth/Sex: Treating RN: [birth year] (64 y.o. Male) [name] Primary Care Moriya Mitchell: [name] Other Clinician: Referring Diago Haik: Treating Raquelle Pietro/Extender: [name] in Treatment: 0 Wound Status Wound Number: 1 Primary Pressure Ulcer Etiology: Wound Location: Right Gluteus Wound Open Wounding Event: Pressure Injury Status: Date Acquired: 08/14/2020 Comorbid Congestive Heart Failure, Hypertension, Hepatitis C, End Weeks Of Treatment: 0 History: Stage Renal Disease Clustered Wound: No Photos Wound Measurements Length: (cm) 3.5 Width: (cm) 7 Depth: (cm) 0.1 Area: (cm) 19.242 Volume: (cm) 1.924 % Reduction in Area: 0% % Reduction in Volume: 0% Epithelialization: Small (1-33%) Tunneling: No Undermining: No Wound Description Classification: Category/Stage III Wound Margin: Distinct, outline attached Exudate Amount: Medium Exudate Type: Serosanguineous Exudate Color: red, brown Foul Odor After Cleansing: No Slough/Fibrino Yes Wound Bed Granulation Amount: Medium (34-66%) Exposed Structure Granulation Quality: Red Fascia Exposed: No Necrotic Amount: Medium (34-66%) Fat Layer (Subcutaneous Tissue)  Exposed: Yes Necrotic Quality: Adherent Slough Tendon Exposed: No Muscle Exposed: No Joint Exposed: No Bone Exposed: No Electronic Signature(s) Signed: 10/11/2020 5:35:12 PM By: [name] RN, BSN Signed: 10/12/2020 11:07:31 AM By: Sandre Kitty Entered By: Sandre Kitty on 10/11/2020 16:40:13 -------------------------------------------------------------------------------- Vitals Details Patient  Name: Date of Service: Logan Martin, Logan Martin. 10/11/2020 7:30 A Martin Medical Record Number: [id number] Patient Account Number: [id number] Date of Birth/Sex: Treating RN: [birth year] (64 y.o. Male) [name] Primary Care Harlynn Kimbell: [name] Other Clinician: Referring Finleigh Cheong: Treating Maryuri Warnke/Extender: [name] in Treatment: 0 Vital Signs Time Taken: 07:49 Temperature (F): 99.1 Height (in): 64 Pulse (bpm): 93 Source: Stated Respiratory Rate (breaths/min): 18 Weight (lbs): 108 Blood Pressure (mmHg): 145/77 Source: Stated Reference Range: 80 - 120 mg / dl Body Mass Index (BMI): 18.5 Electronic Signature(s) Signed: 10/11/2020 5:35:12 PM By: [name] RN, BSN Entered By: [name] on 10/11/2020 07:50:11

## 2020-12-13 ENCOUNTER — Ambulatory Visit (INDEPENDENT_AMBULATORY_CARE_PROVIDER_SITE_OTHER): Payer: Medicare HMO | Admitting: Otolaryngology

## 2021-01-09 ENCOUNTER — Emergency Department (HOSPITAL_COMMUNITY): Payer: Medicare Other

## 2021-01-09 ENCOUNTER — Encounter (HOSPITAL_COMMUNITY): Payer: Self-pay | Admitting: Family Medicine

## 2021-01-09 ENCOUNTER — Inpatient Hospital Stay (HOSPITAL_COMMUNITY)
Admission: EM | Admit: 2021-01-09 | Discharge: 2021-01-10 | DRG: 189 | Disposition: A | Discharge status: Home / Self Care | Payer: Medicare Other | Attending: Internal Medicine | Admitting: Internal Medicine

## 2021-01-09 ENCOUNTER — Other Ambulatory Visit (HOSPITAL_COMMUNITY): Payer: Medicare HMO

## 2021-01-09 DIAGNOSIS — Z86711 Personal history of pulmonary embolism: Secondary | ICD-10-CM

## 2021-01-09 DIAGNOSIS — Z20822 Contact with and (suspected) exposure to covid-19: Secondary | ICD-10-CM | POA: Diagnosis not present

## 2021-01-09 DIAGNOSIS — Z833 Family history of diabetes mellitus: Secondary | ICD-10-CM

## 2021-01-09 DIAGNOSIS — D631 Anemia in chronic kidney disease: Secondary | ICD-10-CM | POA: Diagnosis present

## 2021-01-09 DIAGNOSIS — M898X9 Other specified disorders of bone, unspecified site: Secondary | ICD-10-CM | POA: Diagnosis present

## 2021-01-09 DIAGNOSIS — Z8249 Family history of ischemic heart disease and other diseases of the circulatory system: Secondary | ICD-10-CM

## 2021-01-09 DIAGNOSIS — Z87891 Personal history of nicotine dependence: Secondary | ICD-10-CM

## 2021-01-09 DIAGNOSIS — Z882 Allergy status to sulfonamides status: Secondary | ICD-10-CM | POA: Diagnosis not present

## 2021-01-09 DIAGNOSIS — R011 Cardiac murmur, unspecified: Secondary | ICD-10-CM | POA: Diagnosis not present

## 2021-01-09 DIAGNOSIS — R0600 Dyspnea, unspecified: Secondary | ICD-10-CM | POA: Diagnosis present

## 2021-01-09 DIAGNOSIS — I2510 Atherosclerotic heart disease of native coronary artery without angina pectoris: Secondary | ICD-10-CM | POA: Diagnosis present

## 2021-01-09 DIAGNOSIS — I739 Peripheral vascular disease, unspecified: Secondary | ICD-10-CM | POA: Diagnosis not present

## 2021-01-09 DIAGNOSIS — I120 Hypertensive chronic kidney disease with stage 5 chronic kidney disease or end stage renal disease: Secondary | ICD-10-CM | POA: Diagnosis not present

## 2021-01-09 DIAGNOSIS — Z79899 Other long term (current) drug therapy: Secondary | ICD-10-CM

## 2021-01-09 DIAGNOSIS — Z7982 Long term (current) use of aspirin: Secondary | ICD-10-CM

## 2021-01-09 DIAGNOSIS — J9601 Acute respiratory failure with hypoxia: Secondary | ICD-10-CM | POA: Diagnosis present

## 2021-01-09 DIAGNOSIS — R0609 Other forms of dyspnea: Secondary | ICD-10-CM | POA: Diagnosis not present

## 2021-01-09 DIAGNOSIS — N186 End stage renal disease: Secondary | ICD-10-CM

## 2021-01-09 DIAGNOSIS — J810 Acute pulmonary edema: Principal | ICD-10-CM | POA: Diagnosis present

## 2021-01-09 DIAGNOSIS — F1411 Cocaine abuse, in remission: Secondary | ICD-10-CM | POA: Diagnosis present

## 2021-01-09 DIAGNOSIS — Z91014 Allergy to mammalian meats: Secondary | ICD-10-CM | POA: Diagnosis not present

## 2021-01-09 DIAGNOSIS — Z8551 Personal history of malignant neoplasm of bladder: Secondary | ICD-10-CM

## 2021-01-09 DIAGNOSIS — J811 Chronic pulmonary edema: Secondary | ICD-10-CM | POA: Diagnosis present

## 2021-01-09 DIAGNOSIS — D638 Anemia in other chronic diseases classified elsewhere: Secondary | ICD-10-CM | POA: Insufficient documentation

## 2021-01-09 DIAGNOSIS — R9431 Abnormal electrocardiogram [ECG] [EKG]: Secondary | ICD-10-CM | POA: Diagnosis present

## 2021-01-09 DIAGNOSIS — Z992 Dependence on renal dialysis: Secondary | ICD-10-CM | POA: Diagnosis not present

## 2021-01-09 LAB — BASIC METABOLIC PANEL
Anion gap: 17 — ABNORMAL HIGH (ref 5–15)
BUN: 61 mg/dL — ABNORMAL HIGH (ref 8–23)
CO2: 24 mmol/L (ref 22–32)
Calcium: 8.7 mg/dL — ABNORMAL LOW (ref 8.9–10.3)
Chloride: 98 mmol/L (ref 98–111)
Creatinine, Ser: 8.77 mg/dL — ABNORMAL HIGH (ref 0.61–1.24)
GFR, Estimated: 6 mL/min — ABNORMAL LOW (ref >60)
Glucose, Bld: 152 mg/dL — ABNORMAL HIGH (ref 70–99)
Potassium: 4 mmol/L (ref 3.5–5.1)
Sodium: 139 mmol/L (ref 135–145)

## 2021-01-09 LAB — CBC WITH DIFFERENTIAL/PLATELET
Abs Immature Granulocytes: 0.04 10*3/uL (ref 0.00–0.07)
Basophils Absolute: 0.1 10*3/uL (ref 0.0–0.1)
Basophils Relative: 1 %
Eosinophils Absolute: 1.3 10*3/uL — ABNORMAL HIGH (ref 0.0–0.5)
Eosinophils Relative: 10 %
HCT: 32.5 % — ABNORMAL LOW (ref 39.0–52.0)
Hemoglobin: 9.9 g/dL — ABNORMAL LOW (ref 13.0–17.0)
Immature Granulocytes: 0 %
Lymphocytes Relative: 61 %
Lymphs Abs: 7.7 10*3/uL — ABNORMAL HIGH (ref 0.7–4.0)
MCH: 31.7 pg (ref 26.0–34.0)
MCHC: 30.5 g/dL (ref 30.0–36.0)
MCV: 104.2 fL — ABNORMAL HIGH (ref 80.0–100.0)
Monocytes Absolute: 0.8 10*3/uL (ref 0.1–1.0)
Monocytes Relative: 7 %
Neutro Abs: 2.6 10*3/uL (ref 1.7–7.7)
Neutrophils Relative %: 21 %
Platelets: 183 10*3/uL (ref 150–400)
RBC: 3.12 MIL/uL — ABNORMAL LOW (ref 4.22–5.81)
RDW: 19.5 % — ABNORMAL HIGH (ref 11.5–15.5)
Smear Review: ADEQUATE
WBC: 13 10*3/uL — ABNORMAL HIGH (ref 4.0–10.5)
nRBC: 0 % (ref 0.0–0.2)

## 2021-01-09 LAB — I-STAT CHEM 8, ED
BUN: 69 mg/dL — ABNORMAL HIGH (ref 8–23)
Calcium, Ion: 0.95 mmol/L — ABNORMAL LOW (ref 1.15–1.40)
Chloride: 101 mmol/L (ref 98–111)
Creatinine, Ser: 9.1 mg/dL — ABNORMAL HIGH (ref 0.61–1.24)
Glucose, Bld: 136 mg/dL — ABNORMAL HIGH (ref 70–99)
HCT: 32 % — ABNORMAL LOW (ref 39.0–52.0)
Hemoglobin: 10.9 g/dL — ABNORMAL LOW (ref 13.0–17.0)
Potassium: 4 mmol/L (ref 3.5–5.1)
Sodium: 138 mmol/L (ref 135–145)
TCO2: 29 mmol/L (ref 22–32)

## 2021-01-09 LAB — RESP PANEL BY RT-PCR (FLU A&B, COVID) ARPGX2
Influenza A by PCR: NEGATIVE
Influenza B by PCR: NEGATIVE
SARS Coronavirus 2 by RT PCR: NEGATIVE

## 2021-01-09 LAB — TSH: TSH: 2.353 u[IU]/mL (ref 0.350–4.500)

## 2021-01-09 LAB — TROPONIN I (HIGH SENSITIVITY)
Troponin I (High Sensitivity): 217 ng/L (ref <18)
Troponin I (High Sensitivity): 260 ng/L (ref <18)

## 2021-01-09 MED ORDER — SODIUM CHLORIDE 0.9 % IV SOLN: 250.0000 mL | INTRAVENOUS | Status: DC | PRN

## 2021-01-09 MED ORDER — ACETAMINOPHEN 325 MG PO TABS
650.0000 mg | ORAL_TABLET | Freq: Four times a day (QID) | ORAL | Status: DC | PRN
  Filled 2021-01-09: qty 2

## 2021-01-09 MED ORDER — NALOXONE HCL 0.4 MG/ML IJ SOLN: 0.4000 mg | INTRAMUSCULAR | Status: DC | PRN

## 2021-01-09 MED ORDER — NITROGLYCERIN IN D5W 200-5 MCG/ML-% IV SOLN
0.0000 ug/min | INTRAVENOUS | Status: DC
  Administered 2021-01-09: 5 ug/min via INTRAVENOUS
  Filled 2021-01-09: qty 250

## 2021-01-09 MED ORDER — OXYCODONE HCL 5 MG PO TABS
30.0000 mg | ORAL_TABLET | Freq: Once | ORAL | Status: AC
Start: 2021-01-09 — End: 2021-01-09
  Administered 2021-01-09: 30 mg via ORAL
  Filled 2021-01-09: qty 6

## 2021-01-09 MED ORDER — SODIUM CHLORIDE 0.9% FLUSH: 3.0000 mL | INTRAVENOUS | Status: DC | PRN

## 2021-01-09 MED ORDER — ATORVASTATIN CALCIUM 40 MG PO TABS
40.0000 mg | ORAL_TABLET | Freq: Every day | ORAL | Status: DC
  Filled 2021-01-09: qty 1

## 2021-01-09 MED ORDER — LORAZEPAM 2 MG/ML IJ SOLN: 1.0000 mg | INTRAMUSCULAR | Status: DC | PRN

## 2021-01-09 MED ORDER — ONDANSETRON HCL 4 MG/2ML IJ SOLN: 4.0000 mg | Freq: Once | INTRAMUSCULAR | Status: AC

## 2021-01-09 MED ORDER — CHLORHEXIDINE GLUCONATE CLOTH 2 % EX PADS
6.0000 | MEDICATED_PAD | Freq: Every day | CUTANEOUS | Status: DC
  Administered 2021-01-10: 6 via TOPICAL

## 2021-01-09 MED ORDER — CALCITRIOL 0.25 MCG PO CAPS
0.2500 ug | ORAL_CAPSULE | Freq: Every day | ORAL | Status: DC
  Filled 2021-01-09 (×2): qty 1

## 2021-01-09 MED ORDER — ACETAMINOPHEN 650 MG RE SUPP: 650.0000 mg | Freq: Four times a day (QID) | RECTAL | Status: DC | PRN

## 2021-01-09 MED ORDER — ONDANSETRON HCL 4 MG/2ML IJ SOLN
INTRAMUSCULAR | Status: AC
  Administered 2021-01-09: 4 mg via INTRAVENOUS
  Filled 2021-01-09: qty 2

## 2021-01-09 MED ORDER — SENNOSIDES-DOCUSATE SODIUM 8.6-50 MG PO TABS
2.0000 | ORAL_TABLET | Freq: Every day | ORAL | Status: DC
  Filled 2021-01-09: qty 2

## 2021-01-09 MED ORDER — TRAZODONE HCL 50 MG PO TABS
50.0000 mg | ORAL_TABLET | Freq: Every evening | ORAL | Status: DC | PRN
  Administered 2021-01-09: 50 mg via ORAL
  Filled 2021-01-09: qty 1

## 2021-01-09 MED ORDER — CINACALCET HCL 30 MG PO TABS
30.0000 mg | ORAL_TABLET | Freq: Every day | ORAL | Status: DC
  Administered 2021-01-10: 30 mg via ORAL
  Filled 2021-01-09: qty 1

## 2021-01-09 MED ORDER — SODIUM CHLORIDE 0.9% FLUSH
3.0000 mL | Freq: Two times a day (BID) | INTRAVENOUS | Status: DC
  Administered 2021-01-09 – 2021-01-10 (×3): 3 mL via INTRAVENOUS

## 2021-01-09 MED ORDER — TRIMETHOBENZAMIDE HCL 100 MG/ML IM SOLN
200.0000 mg | Freq: Three times a day (TID) | INTRAMUSCULAR | Status: DC | PRN
Start: 2021-01-09 — End: 2021-01-10
  Filled 2021-01-09: qty 2

## 2021-01-09 NOTE — ED Provider Notes (Signed)
[facility] EMERGENCY DEPARTMENT Provider Note   CSN: [id number] Arrival date & time:        History Chief Complaint  Patient presents with   Shortness of Breath  Level 5 caveat due to respiratory distress  Logan Martin is a 65 y.o. male.  The history is provided by the patient and the EMS personnel. The history is limited by the condition of the patient.  Shortness of Breath Severity:  Severe Timing:  Constant Progression:  Worsening Chronicity:  New Relieved by:  Nothing Worsened by:  Nothing    Patient with extensive history including ESRD on dialysis, hypertension, previous PE presents with shortness of breath.  Patient reports sudden onset of shortness of breath tonight.  Denies any recent missed dialysis sessions. 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 Started HD in [place].  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  On NxStage home hemo since 2010. Using RUA AVF.     Hearing aid worn    B/L   Hepatitis C    s/p treatment with Mavyret 03/2017.  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 09/06/2020   Coagulation defect, unspecified ([place]) 09/06/2020   Pruritus, unspecified 09/06/2020    Anaphylactic shock, unspecified, initial encounter 09/02/2020   Pressure injury of skin 08/28/2020   Other psychoactive substance abuse with intoxication, uncomplicated ([place]) 65/46/5035   Abdominal pain    Acute respiratory failure ([place])    Tachycardia 08/20/2020   Oral infection 08/20/2020   Other specified diseases of liver 06/10/2020   Non-ST elevation (NSTEMI) myocardial infarction ([place])    ESRD (end stage renal disease) on dialysis ([place]) 05/05/2020   Prolonged QT interval 05/05/2020   Allergy, unspecified, sequela 03/18/2020   Pain, unspecified 03/18/2020   Personal history of anaphylaxis 03/18/2020   Cellulitis 03/10/2020   Chronic low back pain    Peripheral vascular disease ([place])    Other mechanical complication of surgically created arteriovenous fistula, initial encounter ([place]) 03/09/2020   Unspecified open wound of right upper arm, subsequent encounter 03/09/2020   Hypercalcemia 01/20/2020   Other disorders of phosphorus metabolism 01/01/2020   Severe mitral regurgitation 12/22/2018   Hypercholesterolemia 12/22/2018   History of bladder cancer 08/14/2018   Personal history of pulmonary embolism 08/14/2018   Red blood cell antibody positive, compatible PRBC difficult to obtain 08/14/2018   Malignant neoplasm of overlapping sites of bladder ([place]) 07/20/2018   Volume overload 06/18/2018   Fluid overload 06/17/2018   Elevated troponin 06/17/2018   Hematuria 03/27/2018   Hypokalemia 03/27/2018  Thrombocytopenia ([place]) 03/27/2018   Anemia in ESRD (end-stage renal disease) ([place]) 03/27/2018   Bladder tumor 03/27/2018   Encounter for adequacy testing for peritoneal dialysis ([place]) 01/03/2018   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   Compression of vein 11/12/2017   Hypertension with heart disease 10/14/2017   Pyelocystitis 10/13/2017   Chronic mastoiditis of both sides 06/19/2017   Cramp and spasm 02/20/2017   Acidosis 02/18/2017   Dependence on renal dialysis ([place]) 02/18/2017    Hypertensive heart and chronic kidney disease with heart failure and with stage 5 chronic kidney disease, or end stage renal disease ([place]) 02/18/2017   Iron deficiency anemia, unspecified 02/18/2017   Moderate protein-calorie malnutrition ([place]) 02/18/2017   Other dietary vitamin B12 deficiency anemia 02/18/2017   Other disorders of electrolyte and fluid balance, not elsewhere classified 02/18/2017   Other disorders resulting from impaired renal tubular function 02/18/2017   Personal history of nicotine dependence 02/18/2017   Cardiomegaly 01/21/2017   Congestive heart failure ([place]) 01/21/2017   Pancytopenia ([place]) 06/14/2016   Left arm pain 06/14/2016   SVC (superior vena cava obstruction) 06/14/2016   Mechanical complication of other vascular device, implant, and graft 08/10/2013   End stage renal disease ([place]) 08/10/2013   ESRD (end stage renal disease) ([place]) 06/07/2013   Nausea and vomiting 06/07/2013   SOB (shortness of breath) 05/10/2013   Chest discomfort 11/27/2012   Shortness of breath 11/27/2012   Chronic hepatitis C without hepatic coma ([place]) 02/04/2012   Secondary hyperparathyroidism (of renal origin) 02/04/2012   Anemia associated with chronic renal failure 02/04/2012   CAD (coronary atherosclerotic disease) 02/04/2012   Pulmonary embolism ([place]) 07/20/2010   Chronic pain syndrome 06/08/2010   Renal failure 04/26/2010   Atherosclerosis of native coronary artery of native heart without angina pectoris 02/23/2009   HEMORRHOIDS, INTERNAL 02/23/2009   GERD 02/23/2009   ESRD on dialysis ([place]) 02/16/2009   Allergic rhinitis 05/06/2008    Past Surgical History:  Procedure Laterality Date   A/V FISTULAGRAM N/A 11/22/2017   Procedure: A/V Fistulagram;  Surgeon: Serafina Mitchell, MD;  Location: [facility] CV LAB;  Service: Cardiovascular;  Laterality: N/A;  rt. arm    ABDOMINAL HERNIA REPAIR  1975    x 2   APPLICATION OF WOUND VAC N/A 08/23/2020   Procedure: APPLICATION OF  ABDOMINAL WOUND VAC;  Surgeon: Greer Pickerel, MD;  Location: [facility];  Service: General;  Laterality: N/A;   AV FISTULA PLACEMENT Right 2007   upper arm   AV FISTULA REPAIR Right ~ 03/2016 & 04/2016   BLADDER AUGMENTATION  1975   COLONOSCOPY     COLONOSCOPY WITH PROPOFOL N/A 08/22/2020   Procedure: COLONOSCOPY WITH PROPOFOL;  Surgeon: Jackquline Denmark, MD;  Location[facility] ENDOSCOPY;  Service: Endoscopy;  Laterality: N/A;   CORONARY ANGIOPLASTY WITH STENT PLACEMENT  1999   CYSTOSCOPY W/ URETERAL STENT PLACEMENT N/A 10/13/2017   Procedure: CYSTOSCOPY WITH IRRIGATION OF BLADDER PLACEMENT OF FOLEY;  Surgeon: Ceasar Mons, MD;  Location: [facility];  Service: Urology;  Laterality: N/A;   CYSTOSCOPY WITH FULGERATION N/A 03/27/2018   Procedure: CYSTOSCOPY WITH FULGERATION AND MUCOUS EVACUATION, BLADDER BIOPSY;  Surgeon: Franchot Gallo, MD;  Location: [facility];  Service: Urology;  Laterality: N/A;   ESOPHAGOGASTRODUODENOSCOPY     ESOPHAGOGASTRODUODENOSCOPY N/A 08/24/2020   Procedure: ESOPHAGOGASTRODUODENOSCOPY (EGD);  Surgeon: Jackquline Denmark, MD;  Location: [facility] ENDOSCOPY;  Service: Endoscopy;  Laterality: N/A;   EXTRACORPOREAL SHOCK WAVE LITHOTRIPSY     FISTULA SUPERFICIALIZATION Right 01/02/3253   Procedure: PLICATION  OF ANEURYSM OF ARTERIOVENOUS FISTULA;  Surgeon: Angelia Mould, MD;  Location: [facility];  Service: Vascular;  Laterality: Right;   HERNIA REPAIR     INGUINAL HERNIA REPAIR Right 1975   INGUINAL HERNIA REPAIR Left 03/04/2019   Procedure: LEFT OPEN INGUINAL HERNIA REPAIR WITH MESH;  Surgeon: Kinsinger, Arta Bruce, MD;  Location: [facility];  Service: General;  Laterality: Left;  GENERAL AND LMA   INSERTION OF DIALYSIS CATHETER Left 03/15/2020   Procedure: INSERTION OF Left Internal Jugular DIALYSIS CATHETER;  Surgeon: [name], MD;  Location: [facility];  Service: Vascular;  Laterality: Left;   IR FLUORO GUIDE CV LINE LEFT  03/11/2020   IR FLUORO GUIDE CV LINE LEFT  08/20/2020   IR US GUIDE  VASC ACCESS LEFT  03/11/2020   IR US GUIDE VASC ACCESS LEFT  08/20/2020   IR US GUIDE VASC ACCESS RIGHT  08/20/2020   IR VENO/JUGULAR RIGHT  08/20/2020   LAPAROTOMY N/A 08/23/2020   Procedure: EXPLORATORY LAPAROTOMY;  Surgeon: Greer Pickerel, MD;  Location: [facility];  Service: General;  Laterality: N/A;   LAPAROTOMY N/A 08/25/2020   Procedure: RE EXPLORE EXPLORATORY LAPAROTOMY, WASHOUT AND CLOSURE;  Surgeon: Greer Pickerel, MD;  Location: [facility];  Service: General;  Laterality: N/A;   LEFT HEART CATH AND CORONARY ANGIOGRAPHY N/A 05/06/2020   Procedure: LEFT HEART CATH AND CORONARY ANGIOGRAPHY;  Surgeon: Nigel Mormon, MD;  Location: [facility] CV LAB;  Service: Cardiovascular;  Laterality: N/A;   PERIPHERAL VASCULAR BALLOON ANGIOPLASTY  11/22/2017   Procedure: PERIPHERAL VASCULAR BALLOON ANGIOPLASTY;  Surgeon: Serafina Mitchell, MD;  Location: [facility] CV LAB;  Service: Cardiovascular;;  rt. arm fistula   PERIPHERAL VASCULAR BALLOON ANGIOPLASTY Right 09/27/2020   Procedure: PERIPHERAL VASCULAR BALLOON ANGIOPLASTY;  Surgeon: Serafina Mitchell, MD;  Location: [facility] CV LAB;  Service: Cardiovascular;  Laterality: Right;  arm fistula   PERIPHERAL VASCULAR CATHETERIZATION N/A 06/22/2016   Procedure: Left Arm Venography;  Surgeon: Elam Dutch, MD;  Location: [facility] CV LAB;  Service: Cardiovascular;  Laterality: N/A;   PERIPHERAL VASCULAR CATHETERIZATION N/A 06/22/2016   Procedure: A/V Fistulagram - Right Arm;  Surgeon: Elam Dutch, MD;  Location: [facility] CV LAB;  Service: Cardiovascular;  Laterality: N/A;   PERIPHERAL VASCULAR CATHETERIZATION Right 06/22/2016   Procedure: Peripheral Vascular Balloon Angioplasty;  Surgeon: Elam Dutch, MD;  Location: [facility] CV LAB;  Service: Cardiovascular;  Laterality: Right;  arm fistula   POLYPECTOMY  08/22/2020   Procedure: POLYPECTOMY;  Surgeon: Jackquline Denmark, MD;  Location[facility] ENDOSCOPY;  Service: Endoscopy;;   REVISON OF ARTERIOVENOUS  FISTULA Right 03/15/2020   Procedure: RIGHT ARM ARTERIOVENOUS FISTULA REVISON;  Surgeon: [name], MD;  Location: [facility];  Service: Vascular;  Laterality: Right;   SPLENECTOMY, TOTAL N/A 08/23/2020   Procedure: SPLENECTOMY;  Surgeon: Greer Pickerel, MD;  Location: [facility];  Service: General;  Laterality: N/A;       Family History  Problem Relation Age of Onset   Cancer Mother    Diabetes Mother    Hypertension Mother    Deep vein thrombosis Daughter     Social History   Tobacco Use   Smoking status: Former    Packs/day: 1.00    Years: 5.00    Pack years: 5.00    Types: Cigarettes    Quit date: 05/28/1993    Years since quitting: 27.6   Smokeless tobacco: Never  Vaping Use   Vaping Use: Never  used  Substance Use Topics   Alcohol use: Not Currently    Comment:  "nothing since 1995"   Drug use: Not Currently    Types: Marijuana, Cocaine    Comment: denies 03/02/19    Home Medications Prior to Admission medications   Medication Sig Start Date End Date Taking? Authorizing Provider  aspirin EC 81 MG tablet Take 81 mg by mouth at bedtime.     [provider]  atorvastatin (LIPITOR) 40 MG tablet Take 40 mg by mouth daily. 01/20/20   [provider]  AURYXIA 1 GM 210 MG(Fe) tablet Take 420 mg by mouth 3 (three) times daily. 02/12/20   [provider]  calcitRIOL (ROCALTROL) 0.25 MCG capsule Take 0.25 mcg by mouth daily.    [provider]  cinacalcet (SENSIPAR) 30 MG tablet Take 30 mg by mouth daily.    [provider]  hydrALAZINE (APRESOLINE) 25 MG tablet Take 1 tablet (25 mg total) by mouth 3 (three) times daily. 05/07/20   Dana Allan I, MD  isosorbide mononitrate (IMDUR) 30 MG 24 hr tablet Take 30 mg by mouth daily.  08/15/18   [provider]  labetalol (NORMODYNE) 100 MG tablet Take 100 mg by mouth 3 (three) times daily. Except on Mon Wed Fri take 2 tablets daily 08/03/20   [provider]  lidocaine-prilocaine  (EMLA) cream Apply 1 application topically See admin instructions. Apply small amount to dialysis port (AVF) one hour before dialysis. Cover with occlusive dressing (saran wrap) 11/14/17   [provider]  LOKELMA 5 g packet Take 1 packet by mouth daily. 08/03/20   [provider]  Multiple Vitamins-Minerals (MULTIVITAMIN WITH MINERALS) tablet Take 1 tablet by mouth daily.    [provider]  oxycodone (ROXICODONE) 30 MG immediate release tablet Take 30 mg by mouth in the morning, at noon, and at bedtime.    [provider]  Oxycodone HCl 10 MG TABS Take 10 mg by mouth 2 (two) times daily as needed for moderate pain. 04/29/20   [provider]  traZODone (DESYREL) 150 MG tablet Take 150 mg by mouth at bedtime as needed for sleep. 08/17/20   [provider]  Vitamin D, Ergocalciferol, (DRISDOL) 1.25 MG (50000 UNIT) CAPS capsule Take 50,000 Units by mouth once a week. 08/03/20   [provider]    Allergies    Heparin, Pork-derived products, and Sulfa antibiotics  Review of Systems   Review of Systems  Unable to perform ROS: Severe respiratory distress  Respiratory:  Positive for shortness of breath.    Physical Exam Updated Vital Signs Pulse (!) 132   Resp (!) 28   SpO2 100%   Physical Exam CONSTITUTIONAL: Acutely ill-appearing HEAD: Normocephalic/atraumatic EYES: EOM ENMT: Mucous membranes moist NECK: supple no meningeal signs SPINE/BACK:entire spine nontender CV: S1/S2 noted, tachycardic LUNGS: Tachypnea, decreased breath sounds in the bases, wheezing and crackles are noted ABDOMEN: soft, midline scar noted but no tenderness GU:no cva tenderness NEURO: Pt is awake/alert/appropriate, moves all extremitiesx4.  EXTREMITIES: pulses normal/equal, full ROM, no lower extremity edema Dialysis access to  right arm with thrill noted SKIN: warm, color normal PSYCH: Patient appears anxious  ED Results / Procedures / Treatments    Labs (all labs ordered are listed, but only abnormal results are displayed) Labs Reviewed  BASIC METABOLIC PANEL - Abnormal; Notable for the following components:      Result Value   Glucose, Bld 152 (*)    BUN 61 (*)  Creatinine, Ser 8.77 (*)    Calcium 8.7 (*)    GFR, Estimated 6 (*)    Anion gap 17 (*)    All other components within normal limits  CBC WITH DIFFERENTIAL/PLATELET - Abnormal; Notable for the following components:   WBC 13.0 (*)    RBC 3.12 (*)    Hemoglobin 9.9 (*)    HCT 32.5 (*)    MCV 104.2 (*)    RDW 19.5 (*)    All other components within normal limits  I-STAT CHEM 8, ED - Abnormal; Notable for the following components:   BUN 69 (*)    Creatinine, Ser 9.10 (*)    Glucose, Bld 136 (*)    Calcium, Ion 0.95 (*)    Hemoglobin 10.9 (*)    HCT 32.0 (*)    All other components within normal limits  RESP PANEL BY RT-PCR (FLU A&B, COVID) [id number]    EKG EKG Interpretation  Date/Time:  Monday January 09 2021 04:03:18 EDT Ventricular Rate:  128 PR Interval:  119 QRS Duration: 144 QT Interval:  346 QTC Calculation: 505 R Axis:   85 Text Interpretation: Sinus or ectopic atrial tachycardia Probable left atrial enlargement IVCD, consider atypical RBBB Probable lateral infarct, age indeterminate Confirmed by Ripley Fraise ([id number]) on 01/09/2021 4:06:11 AM  Radiology DG Chest Port 1 View  Result Date: 01/09/2021 CLINICAL DATA:  65 year old male with shortness of breath. End stage renal disease. EXAM: PORTABLE CHEST 1 VIEW COMPARISON:  Portable chest 08/26/2020 and earlier. FINDINGS: Stable lung volumes. Stable cardiomegaly and mediastinal contours. Chronic right innominate vein region vascular stent. Small to moderate new left pleural effusion, partially tracking into the left pleural fissures. Diffusely increased pulmonary vascularity. No pneumothorax. Probable small left pleural effusion. No air bronchograms. Calcified abdominal aortic atherosclerosis. No acute  osseous abnormality identified. Paucity of bowel gas in the upper abdomen. IMPRESSION: 1. Acute pulmonary edema with up to moderate left and small right pleural effusions. 2. Stable cardiomegaly.  Aortic Atherosclerosis (ICD10-I70.0). Electronically Signed   By: Genevie Ann M.D.   On: 01/09/2021 04:29    Procedures .Critical Care  Date/Time: 01/09/2021 4:16 AM Performed by: Ripley Fraise, MD Authorized by: Ripley Fraise, MD   Critical care provider statement:    Critical care time (minutes):  56   Critical care start time:  01/09/2021 4:16 AM   Critical care end time:  01/09/2021 5:12 AM   Critical care time was exclusive of:  Separately billable procedures and treating other patients   Critical care was necessary to treat or prevent imminent or life-threatening deterioration of the following conditions:  Respiratory failure and renal failure   Critical care was time spent personally by me on the following activities:  Development of treatment plan with patient or surrogate, discussions with consultants, discussions with primary provider, re-evaluation of patient's condition, pulse oximetry, ordering and review of radiographic studies, ordering and review of laboratory studies, ordering and performing treatments and interventions, review of old charts, evaluation of patient's response to treatment and obtaining history from patient or surrogate   I assumed direction of critical care for this patient from another provider in my specialty: no     Care discussed with: admitting provider     Medications Ordered in ED Medications  nitroGLYCERIN 50 mg in dextrose 5 % 250 mL (0.2 mg/mL) infusion (5 mcg/min Intravenous New Bag/Given 01/09/21 0506)  ondansetron (ZOFRAN) injection 4 mg (4 mg Intravenous Given 01/09/21 0505)    ED Course  I have  reviewed the triage vital signs and the nursing notes.  Pertinent labs & imaging results that were available during my care of the patient were reviewed by me  and considered in my medical decision making (see chart for details).    MDM Rules/Calculators/A&P                           Patient is very ill-appearing on arrival.  He reports abrupt onset of shortness of breath.  Suspect hypertensive emergency/pulmonary edema.  Patient has been placed on noninvasive ventilation with improvement.  Will start nitroglycerin drip. 4:40 AM X-ray confirms acute pulmonary edema.  Patient appears to be improving on noninvasive ventilation.  We will start nitro drip IV had to be placed in his leg due to poor IV access 5:09 AM Patient reported he was feeling nauseous, BiPAP mask was removed and he vomited.  He is now feeling improved.  He is back on BiPAP. Discussed with Dr. Jonnie Finner with nephrology.  He will make arrangements for dialysis. Discussed with Dr. Tonie Griffith  with [facility] hospitalist to admit. Discussed with daughter who was at bedside and she was updated on plan Final Clinical Impression(s) / ED Diagnoses Final diagnoses:  Acute respiratory failure with hypoxia ([place])  ESRD (end stage renal disease) ([place])  Acute pulmonary edema ([place])    Rx / DC Orders ED Discharge Orders     None        Ripley Fraise, MD 01/09/21 [phone]

## 2021-01-09 NOTE — ED Triage Notes (Signed)
EMS arrival. Patient arrives on non-rebreather. Tripod. Aox4.

## 2021-01-09 NOTE — H&P (Signed)
History and Physical    Logan Martin UKG:[id number] DOB: Aug 29, 1955 DOA: 01/09/2021  PCP: [name], NP   Patient coming from: Home  Chief Complaint: SOB  HPI: Logan Martin is a 65 y.o. male with medical history significant for ESRD on HD,  HTN, CAD, PVD, hx of PE, hx of cocaine use but quit in 1995 who presents by EMS with complaint of SOB. He reports he developed acute shortness of breath tonight that came on suddenly and without warning.  He reports the last few nights he has had difficulty sleeping and has felt very smothered laying flat and has had to sit up and have a fan blowing into his face for him to feel like he could breathe better.  He states he has not had any swelling in his legs or abdomen.  He has not missed any hemodialysis sessions.  He undergoes dialysis on Mondays Wednesdays and Fridays.  Ports he has not had any fever or chills.  He reports he did have generalized fatigue last night.  He had an episode of nausea and vomiting while in the emergency room but had no nausea at home and does not feel nauseous now.  He denies any abdominal pain.  The emergency room he was on a nonrebreather sitting in a tripod position and was placed on BiPAP which he is tolerating well and he states is making feel better.  He denies having any chest pain or pressure.  Denies any change in his diet or new exposures to chemicals, perfumes, cleaning agents, soaps or detergents.   ED Course: In the emergency room patient was placed on BiPAP and respiratory status stabilized.  He was also placed on nitroglycerin infusion.  WBCs 13,000 hemoglobin 9.9 hematocrit 32.5 platelets 183,000, sodium 139 potassium 4.0 chloride 98 bicarb 24 creatinine 8.77 BUN 61 glucose 152. Covid-19 swab is negative.  ER physician discussed with nephrology who will see patient this morning and arrange for hemodialysis.  Hospitalist service has been asked to admit for further management  Review of Systems:  General:  Reports weakness. Denies fever, chills, weight loss, night sweats. Denies dizziness. Denies change in appetite HENT: Denies head trauma, headache, denies change in hearing, tinnitus.  Denies nasal congestion. Denies sore throat.  Denies difficulty swallowing Eyes: Denies blurry vision, pain in eye, drainage.  Denies discoloration of eyes. Neck: Denies pain.  Denies swelling.  Denies pain with movement. Cardiovascular: Denies chest pain, palpitations.  Denies edema.  Reports orthopnea Respiratory: Reports shortness of breath. Denies cough.  Denies wheezing.  Denies sputum production Gastrointestinal: Had episode of nausea and vomiting in Er. Denies abdominal pain, swelling.  Denies diarrhea. Denies melena.  Denies hematemesis. Musculoskeletal: Denies limitation of movement.  Denies deformity or swelling. Denies arthralgias or myalgias. Genitourinary: Denies pelvic pain.  Denies urinary frequency or hesitancy.  Denies dysuria.  Skin: Denies rash.  Denies petechiae, purpura, ecchymosis. Neurological: Denies syncope. Denies seizure activity. Denies paresthesia. Denies slurred speech, drooping face.  Denies visual change. Psychiatric: Denies depression, anxiety.  Denies hallucinations.  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 Started HD  in [place], [place].  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  On NxStage home hemo since 2010. Using RUA AVF.     Hearing aid worn    B/L   Hepatitis C    s/p treatment with Mavyret 03/2017.  undetedctable virus in 03/2017   History of blood transfusion 2007 X 1   History of kidney stones    Hx of constipation    Hypertension    Left inguinal hernia    Peripheral  vascular disease ([place])    Pulmonary embolism [facility]) Feb 2012   Treated with coumadin x 1 year   Wears glasses     Past Surgical History:  Procedure Laterality Date   A/V FISTULAGRAM N/A 11/22/2017   Procedure: A/V Fistulagram;  Surgeon: Serafina Mitchell, MD;  Location: [place] [facility];  Service: Cardiovascular;  Laterality: N/A;  rt. arm    ABDOMINAL HERNIA REPAIR  4174    x 2   APPLICATION OF WOUND VAC N/A 08/23/2020   Procedure: APPLICATION OF ABDOMINAL WOUND VAC;  Surgeon: Greer Pickerel, MD;  Location: [place];  Service: General;  Laterality: N/A;   AV FISTULA PLACEMENT Right 2007   upper arm   AV FISTULA REPAIR Right ~ 03/2016 & 04/2016   BLADDER AUGMENTATION  1975   COLONOSCOPY     COLONOSCOPY WITH PROPOFOL N/A 08/22/2020   Procedure: COLONOSCOPY WITH PROPOFOL;  Surgeon: Jackquline Denmark, MD;  Location: [facility];  Service: Endoscopy;  Laterality: N/A;   CORONARY ANGIOPLASTY WITH STENT PLACEMENT  1999   CYSTOSCOPY W/ URETERAL STENT PLACEMENT N/A 10/13/2017   Procedure: CYSTOSCOPY WITH IRRIGATION OF BLADDER PLACEMENT OF FOLEY;  Surgeon: Ceasar Mons, MD;  Location: [place];  Service: Urology;  Laterality: N/A;   CYSTOSCOPY WITH FULGERATION N/A 03/27/2018   Procedure: CYSTOSCOPY WITH FULGERATION AND MUCOUS EVACUATION, BLADDER BIOPSY;  Surgeon: Franchot Gallo, MD;  Location: [facility];  Service: Urology;  Laterality: N/A;   ESOPHAGOGASTRODUODENOSCOPY     ESOPHAGOGASTRODUODENOSCOPY N/A 08/24/2020   Procedure: ESOPHAGOGASTRODUODENOSCOPY (EGD);  Surgeon: Jackquline Denmark, MD;  Location[facility] END[facility]COPY;  Service: Endoscopy;  Laterality: N/A;   EXTRACORPOREAL SHOCK WAVE LITHOTRIPSY     FISTULA SUPERFICIALIZATION Right 0/12/1446   Procedure: PLICATION OF ANEURYSM OF ARTERIOVENOUS FISTULA;  Surgeon: Angelia Mould, MD;  Location: [place];  Service: Vascular;  Laterality: Right;   HERNIA REPAIR     INGUINAL HERNIA REPAIR Right [place] Left 03/04/2019    Procedure: LEFT OPEN INGUINAL HERNIA REPAIR WITH MESH;  Surgeon: Kinsinger, Arta Bruce, MD;  Location: [place];  Service: General;  Laterality: Left;  GENERAL AND LMA   INSERTION OF DIALYSIS CATHETER Left 03/15/2020   Procedure: INSERTION OF Left Internal Jugular DIALYSIS CATHETER;  Surgeon: [name], MD;  Location: [place];  Service: Vascular;  Laterality: Left;   IR FLUORO GUIDE CV LINE LEFT  03/11/2020   IR FLUORO GUIDE CV LINE LEFT  08/20/2020   IR US GUIDE VASC ACCESS LEFT  03/11/2020   IR US GUIDE VASC ACCESS LEFT  08/20/2020   IR US GUIDE VASC ACCESS RIGHT  08/20/2020   IR VENO/JUGULAR RIGHT  08/20/2020   LAPAROTOMY N/A 08/23/2020   Procedure: EXPLORATORY LAPAROTOMY;  Surgeon: Greer Pickerel, MD;  Location: [place];  Service: General;  Laterality: N/A;   LAPAROTOMY N/A 08/25/2020   Procedure: RE EXPLORE EXPLORATORY LAPAROTOMY, WASHOUT AND CLOSURE;  Surgeon: Greer Pickerel, MD;  Location: [place];  Service: General;  Laterality:  N/A;   LEFT HEART CATH AND CORONARY ANGIOGRAPHY N/A 05/06/2020   Procedure: LEFT HEART CATH AND CORONARY ANGIOGRAPHY;  Surgeon: Nigel Mormon, MD;  Location: [place] [facility];  Service: Cardiovascular;  Laterality: N/A;   PERIPHERAL VASCULAR BALLOON ANGIOPLASTY  11/22/2017   Procedure: PERIPHERAL VASCULAR BALLOON ANGIOPLASTY;  Surgeon: Serafina Mitchell, MD;  Location: [place] [facility];  Service: Cardiovascular;;  rt. arm fistula   PERIPHERAL VASCULAR BALLOON ANGIOPLASTY Right 09/27/2020   Procedure: PERIPHERAL VASCULAR BALLOON ANGIOPLASTY;  Surgeon: Serafina Mitchell, MD;  Location: [place] [facility];  Service: Cardiovascular;  Laterality: Right;  arm fistula   PERIPHERAL VASCULAR CATHETERIZATION N/A 06/22/2016   Procedure: Left Arm Venography;  Surgeon: Elam Dutch, MD;  Location: [place] [facility];  Service: Cardiovascular;  Laterality: N/A;   PERIPHERAL VASCULAR CATHETERIZATION N/A 06/22/2016   Procedure: A/V Fistulagram - Right Arm;  Surgeon: Elam Dutch,  MD;  Location: [place] [facility];  Service: Cardiovascular;  Laterality: N/A;   PERIPHERAL VASCULAR CATHETERIZATION Right 06/22/2016   Procedure: Peripheral Vascular Balloon Angioplasty;  Surgeon: Elam Dutch, MD;  Location: [place] [facility];  Service: Cardiovascular;  Laterality: Right;  arm fistula   POLYPECTOMY  08/22/2020   Procedure: POLYPECTOMY;  Surgeon: Jackquline Denmark, MD;  Location: [facility] ENDOSCOPY;  Service: Endoscopy;;   REVISON OF ARTERIOVENOUS FISTULA Right 03/15/2020   Procedure: RIGHT ARM ARTERIOVENOUS FISTULA REVISON;  Surgeon: [name], MD;  Location: [place];  Service: Vascular;  Laterality: Right;   SPLENECTOMY, TOTAL N/A 08/23/2020   Procedure: SPLENECTOMY;  Surgeon: Greer Pickerel, MD;  Location: [place];  Service: General;  Laterality: N/A;    Social History  reports that he quit smoking about 27 years ago. His smoking use included cigarettes. He has a 5.00 pack-year smoking history. He has never used smokeless tobacco. He reports that he does not currently use alcohol. He reports that he does not currently use drugs after having used the following drugs: Marijuana and Cocaine.  Allergies  Allergen Reactions   Heparin Rash    Pork products    Pork-Derived Products Rash   Sulfa Antibiotics Rash    Family History  Problem Relation Age of Onset   Cancer Mother    Diabetes Mother    Hypertension Mother    Deep vein thrombosis Daughter      Prior to Admission medications   Medication Sig Start Date End Date Taking? Authorizing Provider  aspirin EC 81 MG tablet Take 81 mg by mouth at bedtime.     [provider]  atorvastatin (LIPITOR) 40 MG tablet Take 40 mg by mouth daily. 01/20/20   [provider]  AURYXIA 1 GM 210 MG(Fe) tablet Take 420 mg by mouth 3 (three) times daily. 02/12/20   [provider]  calcitRIOL (ROCALTROL) 0.25 MCG capsule Take 0.25 mcg by mouth daily.    [provider]  cinacalcet (SENSIPAR) 30 MG tablet Take  30 mg by mouth daily.    [provider]  hydrALAZINE (APRESOLINE) 25 MG tablet Take 1 tablet (25 mg total) by mouth 3 (three) times daily. 05/07/20   Dana Allan I, MD  isosorbide mononitrate (IMDUR) 30 MG 24 hr tablet Take 30 mg by mouth daily.  08/15/18   [provider]  labetalol (NORMODYNE) 100 MG tablet Take 100 mg by mouth 3 (three) times daily. Except on Mon Wed Fri take 2 tablets daily 08/03/20   [provider]  lidocaine-prilocaine (EMLA)  cream Apply 1 application topically See admin instructions. Apply small amount to dialysis port (AVF) one hour before dialysis. Cover with occlusive dressing (saran wrap) 11/14/17   [provider]  LOKELMA 5 g packet Take 1 packet by mouth daily. 08/03/20   [provider]  Multiple Vitamins-Minerals (MULTIVITAMIN WITH MINERALS) tablet Take 1 tablet by mouth daily.    [provider]  oxycodone (ROXICODONE) 30 MG immediate release tablet Take 30 mg by mouth in the morning, at noon, and at bedtime.    [provider]  Oxycodone HCl 10 MG TABS Take 10 mg by mouth 2 (two) times daily as needed for moderate pain. 04/29/20   [provider]  traZODone (DESYREL) 150 MG tablet Take 150 mg by mouth at bedtime as needed for sleep. 08/17/20   [provider]  Vitamin D, Ergocalciferol, (DRISDOL) 1.25 MG (50000 UNIT) CAPS capsule Take 50,000 Units by mouth once a week. 08/03/20   [provider]    Physical Exam: Vitals:   01/09/21 0404  Pulse: (!) 132  Resp: (!) 28  SpO2: 100%    Constitutional: NAD, calm, comfortable on Bipap Vitals:   01/09/21 0404  Pulse: (!) 132  Resp: (!) 28  SpO2: 100%   General: WDWN, Alert and oriented x3.  Eyes: EOMI, PERRL, conjunctivae normal.  Sclera nonicteric HENT:  [place]/AT, external ears normal.  Nares patent without epistasis. Bipap mask in good position. No lesions of external mouth Neck: Soft, normal range of motion, supple, no  masses, no thyromegaly. Trachea midline Respiratory: Equal breath sounds that are diminished in the bases.  Diffuse rales and expiratory wheezing, no crackles.  No rhonchi.  On BiPAP. no accessory muscle use.  Cardiovascular: Regular rhythm, tachycardia, Has 4/6 systolic murmur. No rubs / gallops. No extremity edema. 2+ pedal pulses. Abdomen: Soft, no tenderness, nondistended, no rebound or guarding.  No masses palpated. Bowel sounds normoactive Musculoskeletal: FROM. no cyanosis. Has fistula in right upper arm with good thrill. Normal muscle tone.  Skin: Warm, dry, intact no rashes, lesions, ulcers. No induration Neurologic: CN 2-12 grossly intact.  Normal speech. No tremor. Strength 5/5 in all extremities.   Psychiatric: Normal judgment and insight.  Normal mood.    Labs on Admission: I have personally reviewed following labs and imaging studies  CBC: Recent Labs  Lab 01/09/21 0400 01/09/21 0453  WBC 13.0*  --   NEUTROABS PENDING  --   HGB 9.9* 10.9*  HCT 32.5* 32.0*  MCV 104.2*  --   PLT 183  --     Basic Metabolic Panel: Recent Labs  Lab 01/09/21 0400 01/09/21 0453  NA 139 138  K 4.0 4.0  CL 98 101  CO2 24  --   GLUCOSE 152* 136*  BUN 61* 69*  CREATININE 8.77* 9.10*  CALCIUM 8.7*  --     GFR: CrCl cannot be calculated (Unknown ideal weight.).  Liver Function Tests: No results for input(s): AST, ALT, ALKPHOS, BILITOT, PROT, ALBUMIN in the last 168 hours.  Urine analysis:    Component Value Date/Time   COLORURINE STRAW (A) 03/28/2018 0300   APPEARANCEUR CLEAR 03/28/2018 0300   LABSPEC 1.000 (L) 03/28/2018 0300   PHURINE 6.0 03/28/2018 0300   GLUCOSEU NEGATIVE 03/28/2018 0300   HGBUR NEGATIVE 03/28/2018 0300   BILIRUBINUR NEGATIVE 03/28/2018 0300   KETONESUR NEGATIVE 03/28/2018 0300   PROTEINUR NEGATIVE 03/28/2018 0300   NITRITE NEGATIVE 03/28/2018 0300   LEUKOCYTESUR NEGATIVE 03/28/2018 0300    Radiological Exams  on Admission: DG Chest Port 1  View  Result Date: 01/09/2021 CLINICAL DATA:  64 year old male with shortness of breath. End stage renal disease. EXAM: PORTABLE CHEST 1 VIEW COMPARISON:  Portable chest 08/26/2020 and earlier. FINDINGS: Stable lung volumes. Stable cardiomegaly and mediastinal contours. Chronic right innominate vein region vascular stent. Small to moderate new left pleural effusion, partially tracking into the left pleural fissures. Diffusely increased pulmonary vascularity. No pneumothorax. Probable small left pleural effusion. No air bronchograms. Calcified abdominal aortic atherosclerosis. No acute osseous abnormality identified. Paucity of bowel gas in the upper abdomen. IMPRESSION: 1. Acute pulmonary edema with up to moderate left and small right pleural effusions. 2. Stable cardiomegaly.  Aortic Atherosclerosis (ICD10-I70.0). Electronically Signed   By: Genevie Ann M.D.   On: 01/09/2021 04:29    EKG: Independently reviewed.  EKG shows sinus tachycardia with interventricular conduction block.  No acute ST elevation or depression.  QTc prolonged at 505  Assessment/Plan Principal Problem:   Pulmonary edema Mr. Traywick is admitted to Progressive care unit.  Placed on Bipap and started on NTG infusion in the ER. Is tolerating well and breathing is improved from when he first arrived.  RT following.  Pt with no complaint of chest pain but does report having orthopnea and waking up feeling very short of breath and having to up and have a fan blowing in his face to help him feel like he can breathe better.  He has a new heart murmur.  Both him and his daughter state they have never been told he is a heart murmur in the past and reviewing previous notes there is no mention of heart murmur. Echocardiogram will be obtained Check troponin and BNP to compare to previous levels.  Pt will have hemodialysis later this am  Active Problems:   ESRD on dialysis  Nephrology consulted and will evaluate pt and arrange for HD later  today.     CAD (coronary atherosclerotic disease) Stable. No complaint of chest pain.     Dyspnea Secondary to pulmonary edema. On Bipap and tolerating it well. RT to follow.  Bipap will be weaned as tolerated.     Heart murmur Patient and his daughter report that he has never been told he has a heart murmur and looking back at previous notes there is no mention of heart murmur.  Patient now has a 4/6 systolic murmur. 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    Prolonged QT interval Avoid medications which could further prolong QT interval   DVT prophylaxis: SCD for DVT prophylaxis.  Code Status:   Full Code  Family Communication:  Diagnosis and plan as discussed with patient and his daughter who is at bedside.  They both verbalized understanding and agree with plan.  Questions answered.  Further recommendations to follow as clinically indicated Disposition Plan:   Patient is from:  Home  Anticipated DC to:  Home  Anticipated DC date:  Anticipate 2 midnight or more stay in the hospital to treat acute condition  Anticipated DC barriers: No barriers to discharge identified at this time  Consults called:  Nephrology consulted by ER physician and will see pt this am and arrange HD today  Admission status:  Inpatient  Yevonne Aline Monic Engelmann MD [facility]  How to contact the [facility] Attending or Consulting provider [place] or covering provider during after hours [place], for this patient?   Check the care  team in [facility] and look for a) attending/consulting [place] provider listed and b) the [facility] team listed Log into [email or web address] and use 's universal password to access. If you do not have the password, please contact the hospital operator. Locate the [facility] provider you are looking for under [facility] and page to a number that you can be directly reached. If you still have difficulty reaching the provider,  please page the [facility] (Director on Call) for the Hospitalists listed on amion for assistance.  01/09/2021, 5:46 AM

## 2021-01-09 NOTE — Progress Notes (Signed)
Patient taken off bipap and placed on 4L [place]. Vitals are stable at this time. RN made aware.

## 2021-01-09 NOTE — ED Notes (Signed)
Preparing to go to HD. HD ready. Will go up on Bipap, then transition off bipap1 hr after HD tx started.

## 2021-01-09 NOTE — Progress Notes (Signed)
Pt placed on bipap per MD.

## 2021-01-09 NOTE — Procedures (Signed)
Patient seen and examined on Hemodialysis. BP (!) 133/48   Pulse 87   Temp 98.3 F (36.8 C) (Oral)   Resp 18   SpO2 100%   QB 400 mL/ min via RUE AVF, UF goal 4.5L  He is at [facility] for a [facility] time.  He is slated to go back to [facility] 01[facility]16/[facility].  He doesn't miss rx--> he needs EDW challenged and needs a new EDW upon d/c.  Madelon Lips MD [place] [facility] [phone] 10:26 AM

## 2021-01-09 NOTE — Progress Notes (Signed)
  PROGRESS NOTE    Logan Martin  NOT:[id number] DOB: November 21, 1955 DOA: 01/09/2021  PCP: [name], NP    LOS - 0    Patient with ESRD on home hemodialysis, admitted after midnight with flash pulmonary edema and acute respiratory distress.  Nephrology consulted and patient to received HD first thing this AM.    Interval subjective: Seen in dialysis.  Reports he feels MUCH better.  Hoping to go home later today.  Denies any acute complaints.  Exam: Awake, alert, NAD, underweight. Lungs clear diminished bases Heart RRR systolic murmur loudest at apex No peripheral edema RUE fistula accessed for HD   Principal Problem:   Pulmonary edema Active Problems:   ESRD on dialysis (HCC)   CAD (coronary atherosclerotic disease)   Anemia in ESRD (end-stage renal disease) (HCC)   Prolonged QT interval   Heart murmur   Dyspnea    I have reviewed the full H&P by Dr. Tonie Hawken Bielby in detail, and I agree with the assessment and plan as outlined therein. In addition: --follow up nephrology recommendations   No Charge    Ezekiel Slocumb, DO [facility]   If 7PM-7AM, please contact night-coverage [email or web address] 01/09/2021, 7:32 AM

## 2021-01-09 NOTE — Consult Note (Signed)
Renal Service Consult Note Logan Martin 01/09/2021 Sol Blazing, MD Requesting Physician: Dr. Tonie Griffith  Reason for Consult: ESRD pt w/ resp distress HPI: The patient is a 65 y.o. year-old w/ hx of anemia, ESRD on HD, HTN, PAD presented to ED w/ SOB and resp distress. Onset was sudden this evening. In ED CXR shows pulm edema. K 4.0 Cr 9.10 CO2 24  Hb 10.9.  Pt treated w/ Bipap and IV ntg. Had emesis x 1 in ED. Breathing better now on bipap. Asked to see for HD.    Pt seen in room w/ daughter.  Per dtr he has been feeling poorly at night for about 2 wks, w/ orthopnea and PND episodes.  Thinks he may have lost body wt.  Has not missed HD is on home HD. Overall on HD for over 20 yrs.    Per dtr pt lives w/ his son.  Pt denies any recent chest pain.    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 Started HD in [place].  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  On NxStage home hemo since 2010. Using RUA AVF.     Hearing aid worn    B/L   Hepatitis C    s/p treatment with Mavyret 03/2017.  undetedctable virus in 03/2017   History of blood transfusion 2007 X 1   History of kidney stones    Hx of constipation    Hypertension    Left inguinal hernia    Peripheral vascular disease ([place])    Pulmonary embolism [facility]) Feb 2012   Treated with coumadin x 1 year   Wears glasses    Past Surgical History  Past Surgical History:  Procedure Laterality Date   A/V FISTULAGRAM N/A 11/22/2017   Procedure: A/V Fistulagram;  Surgeon: Serafina Mitchell, MD;  Location: [place] [facility];  Service: Cardiovascular;  Laterality: N/A;  rt. arm    ABDOMINAL HERNIA REPAIR  3220    x 2   APPLICATION OF WOUND VAC N/A 08/23/2020   Procedure: APPLICATION OF ABDOMINAL WOUND VAC;  Surgeon: Greer Pickerel, MD;  Location: [place];  Service: General;  Laterality: N/A;   AV FISTULA PLACEMENT Right 2007   upper arm   AV FISTULA REPAIR Right ~ 03/2016 & 04/2016   BLADDER AUGMENTATION  1975   COLONOSCOPY     COLONOSCOPY WITH PROPOFOL N/A 08/22/2020   Procedure: COLONOSCOPY WITH PROPOFOL;  Surgeon: Jackquline Denmark, MD;  Location: [facility] ENDOSCOPY;  Service: Endoscopy;  Laterality: N/A;   CORONARY ANGIOPLASTY WITH STENT PLACEMENT  1999   CYSTOSCOPY W/ URETERAL STENT PLACEMENT N/A 10/13/2017   Procedure: CYSTOSCOPY WITH IRRIGATION OF BLADDER PLACEMENT OF FOLEY;  Surgeon: Ceasar Mons, MD;  Location: [place];  Service: Urology;  Laterality: N/A;   CYSTOSCOPY WITH FULGERATION N/A 03/27/2018   Procedure: CYSTOSCOPY WITH FULGERATION AND MUCOUS EVACUATION, BLADDER BIOPSY;  Surgeon: Franchot Gallo,  MD;  Location: [facility];  Service: Urology;  Laterality: N/A;   ESOPHAGOGASTRODUODENOSCOPY     ESOPHAGOGASTRODUODENOSCOPY N/A 08/24/2020   Procedure: ESOPHAGOGASTRODUODENOSCOPY (EGD);  Surgeon: Jackquline Denmark, MD;  Location: [facility] ENDOSCOPY;  Service: Endoscopy;  Laterality: N/A;   EXTRACORPOREAL SHOCK WAVE LITHOTRIPSY     FISTULA SUPERFICIALIZATION Right 10/27/9507   Procedure: PLICATION OF ANEURYSM OF ARTERIOVENOUS FISTULA;  Surgeon: Angelia Mould, MD;  Location: [place];  Service: Vascular;  Laterality: Right;   HERNIA REPAIR     INGUINAL HERNIA REPAIR Right [place] Left 03/04/2019   Procedure: LEFT OPEN INGUINAL HERNIA REPAIR WITH MESH;  Surgeon: Kinsinger, Arta Bruce, MD;  Location: [place];  Service: General;  Laterality: Left;  GENERAL AND LMA   INSERTION OF DIALYSIS CATHETER Left 03/15/2020   Procedure: INSERTION OF Left Internal  Jugular DIALYSIS CATHETER;  Surgeon: [name], MD;  Location: [place];  Service: Vascular;  Laterality: Left;   IR FLUORO GUIDE CV LINE LEFT  03/11/2020   IR FLUORO GUIDE CV LINE LEFT  08/20/2020   IR US GUIDE VASC ACCESS LEFT  03/11/2020   IR US GUIDE VASC ACCESS LEFT  08/20/2020   IR US GUIDE VASC ACCESS RIGHT  08/20/2020   IR VENO/JUGULAR RIGHT  08/20/2020   LAPAROTOMY N/A 08/23/2020   Procedure: EXPLORATORY LAPAROTOMY;  Surgeon: Greer Pickerel, MD;  Location: [place];  Service: General;  Laterality: N/A;   LAPAROTOMY N/A 08/25/2020   Procedure: RE EXPLORE EXPLORATORY LAPAROTOMY, WASHOUT AND CLOSURE;  Surgeon: Greer Pickerel, MD;  Location: [place];  Service: General;  Laterality: N/A;   LEFT HEART CATH AND CORONARY ANGIOGRAPHY N/A 05/06/2020   Procedure: LEFT HEART CATH AND CORONARY ANGIOGRAPHY;  Surgeon: Nigel Mormon, MD;  Location: [place] [facility];  Service: Cardiovascular;  Laterality: N/A;   PERIPHERAL VASCULAR BALLOON ANGIOPLASTY  11/22/2017   Procedure: PERIPHERAL VASCULAR BALLOON ANGIOPLASTY;  Surgeon: Serafina Mitchell, MD;  Location: [place] [facility];  Service: Cardiovascular;;  rt. arm fistula   PERIPHERAL VASCULAR BALLOON ANGIOPLASTY Right 09/27/2020   Procedure: PERIPHERAL VASCULAR BALLOON ANGIOPLASTY;  Surgeon: Serafina Mitchell, MD;  Location: [place] [facility];  Service: Cardiovascular;  Laterality: Right;  arm fistula   PERIPHERAL VASCULAR CATHETERIZATION N/A 06/22/2016   Procedure: Left Arm Venography;  Surgeon: Elam Dutch, MD;  Location: [place] [facility];  Service: Cardiovascular;  Laterality: N/A;   PERIPHERAL VASCULAR CATHETERIZATION N/A 06/22/2016   Procedure: A/V Fistulagram - Right Arm;  Surgeon: Elam Dutch, MD;  Location: [place] [facility];  Service: Cardiovascular;  Laterality: N/A;   PERIPHERAL VASCULAR CATHETERIZATION Right 06/22/2016   Procedure: Peripheral Vascular Balloon Angioplasty;  Surgeon: Elam Dutch, MD;  Location: [place] [facility];   Service: Cardiovascular;  Laterality: Right;  arm fistula   POLYPECTOMY  08/22/2020   Procedure: POLYPECTOMY;  Surgeon: Jackquline Denmark, MD;  Location: [facility] ENDOSCOPY;  Service: Endoscopy;;   REVISON OF ARTERIOVENOUS FISTULA Right 03/15/2020   Procedure: RIGHT ARM ARTERIOVENOUS FISTULA REVISON;  Surgeon: [name], MD;  Location: [place];  Service: Vascular;  Laterality: Right;   SPLENECTOMY, TOTAL N/A 08/23/2020   Procedure: SPLENECTOMY;  Surgeon: Greer Pickerel, MD;  Location: [place];  Service: General;  Laterality: N/A;   Family History  Family History  Problem Relation Age of Onset   Cancer Mother    Diabetes Mother    Hypertension Mother    Deep vein thrombosis Daughter    Social  History  reports that he quit smoking about 27 years ago. His smoking use included cigarettes. He has a 5.00 pack-year smoking history. He has never used smokeless tobacco. He reports that he does not currently use alcohol. He reports that he does not currently use drugs after having used the following drugs: Marijuana and Cocaine. Allergies  Allergies  Allergen Reactions   Heparin Rash    Pork products    Pork-Derived Products Rash   Sulfa Antibiotics Rash   Home medications Prior to Admission medications   Medication Sig Start Date End Date Taking? Authorizing Provider  aspirin EC 81 MG tablet Take 81 mg by mouth at bedtime.     [provider]  atorvastatin (LIPITOR) 40 MG tablet Take 40 mg by mouth daily. 01/20/20   [provider]  AURYXIA 1 GM 210 MG(Fe) tablet Take 420 mg by mouth 3 (three) times daily. 02/12/20   [provider]  calcitRIOL (ROCALTROL) 0.25 MCG capsule Take 0.25 mcg by mouth daily.    [provider]  cinacalcet (SENSIPAR) 30 MG tablet Take 30 mg by mouth daily.    [provider]  hydrALAZINE (APRESOLINE) 25 MG tablet Take 1 tablet (25 mg total) by mouth 3 (three) times daily. 05/07/20   Dana Allan I, MD  isosorbide mononitrate  (IMDUR) 30 MG 24 hr tablet Take 30 mg by mouth daily.  08/15/18   [provider]  labetalol (NORMODYNE) 100 MG tablet Take 100 mg by mouth 3 (three) times daily. Except on Mon Wed Fri take 2 tablets daily 08/03/20   [provider]  lidocaine-prilocaine (EMLA) cream Apply 1 application topically See admin instructions. Apply small amount to dialysis port (AVF) one hour before dialysis. Cover with occlusive dressing (saran wrap) 11/14/17   [provider]  LOKELMA 5 g packet Take 1 packet by mouth daily. 08/03/20   [provider]  Multiple Vitamins-Minerals (MULTIVITAMIN WITH MINERALS) tablet Take 1 tablet by mouth daily.    [provider]  oxycodone (ROXICODONE) 30 MG immediate release tablet Take 30 mg by mouth in the morning, at noon, and at bedtime.    [provider]  Oxycodone HCl 10 MG TABS Take 10 mg by mouth 2 (two) times daily as needed for moderate pain. 04/29/20   [provider]  traZODone (DESYREL) 150 MG tablet Take 150 mg by mouth at bedtime as needed for sleep. 08/17/20   [provider]  Vitamin D, Ergocalciferol, (DRISDOL) 1.25 MG (50000 UNIT) CAPS capsule Take 50,000 Units by mouth once a week. 08/03/20   [provider]     Vitals:   01/09/21 0404  Pulse: (!) 132  Resp: (!) 28  SpO2: 100%   Exam Gen alert, no distress, calm on BIPAP in ED No rash, cyanosis or gangrene Sclera anicteric, throat clear  No jvd or bruits Chest +basilar rales, no wheezing RRR no MRG Abd soft ntnd no mass or ascites +bs GU normal MS no joint effusions or deformity Ext no LE or UE edema, no wounds or ulcers Neuro is alert, Ox 3 , nf  RUA AVF+bruit     OP HD: Home HD   - details pending   Assessment/ Plan: Acute resp distress/ pulm edema - likely vol overload in ESRD pt. Improved in ED w/ IV ntg and BIPAP.  Will plan on HD 1st shift this am. Having SOB at night for 2 wks, maybe be losing body wt.  ESRD - is on  home HD HTN - cont home meds post HD Anemia ckd - Hb 10.9      Rob Skylene Deremer  MD 01/09/2021, 6:04 AM  Recent Labs  Lab 01/09/21 0400 01/09/21 0453  WBC 13.0*  --   HGB 9.9* 10.9*   Recent Labs  Lab 01/09/21 0400 01/09/21 0453  K 4.0 4.0  BUN 61* 69*  CREATININE 8.77* 9.10*  CALCIUM 8.7*  --

## 2021-01-09 NOTE — ED Notes (Addendum)
RT at [facility]. Tolerating off Bipap. Tolerating . Denies pain, sob, nausea. VSS. To HD on monitor with RT and RN. Bipap remains at [facility] with pt prn.

## 2021-01-09 NOTE — ED Notes (Signed)
Pt c/o central, non radiating CP, 2/10, described as "feels like something is stuck in my chest"; NAD; denies SOB, N/V, pt warm, dry; repeat EKG done; Dr. Arbutus Ped notified

## 2021-01-10 ENCOUNTER — Inpatient Hospital Stay (HOSPITAL_COMMUNITY): Payer: Medicare Other

## 2021-01-10 DIAGNOSIS — R0609 Other forms of dyspnea: Secondary | ICD-10-CM | POA: Diagnosis not present

## 2021-01-10 DIAGNOSIS — J810 Acute pulmonary edema: Secondary | ICD-10-CM | POA: Diagnosis not present

## 2021-01-10 LAB — BASIC METABOLIC PANEL
Anion gap: 10 (ref 5–15)
BUN: 32 mg/dL — ABNORMAL HIGH (ref 8–23)
CO2: 27 mmol/L (ref 22–32)
Calcium: 9.1 mg/dL (ref 8.9–10.3)
Chloride: 98 mmol/L (ref 98–111)
Creatinine, Ser: 6.08 mg/dL — ABNORMAL HIGH (ref 0.61–1.24)
GFR, Estimated: 10 mL/min — ABNORMAL LOW (ref >60)
Glucose, Bld: 81 mg/dL (ref 70–99)
Potassium: 4.4 mmol/L (ref 3.5–5.1)
Sodium: 135 mmol/L (ref 135–145)

## 2021-01-10 LAB — MRSA NEXT GEN BY PCR, NASAL: MRSA by PCR Next Gen: NOT DETECTED

## 2021-01-10 LAB — ECHOCARDIOGRAM COMPLETE
Area-P 1/2: 5.46 cm2
Calc EF: 44.2 %
Height: 64 in
MV M vel: 5.82 m/s
MV Peak grad: 135.6 mmHg
Radius: 1.1 cm
S' Lateral: 4.8 cm
Single Plane A2C EF: 47.6 %
Single Plane A4C EF: 38.4 %
Weight: 1643.75 oz

## 2021-01-10 LAB — PATHOLOGIST SMEAR REVIEW

## 2021-01-10 NOTE — Evaluation (Signed)
Physical Therapy Evaluation & Discharge Patient Details Name: Logan Martin MRN: [id number] DOB: 06-06-1955 Today's Date: 01/10/2021   History of Present Illness  Pt is a 65 y.o. male admitted 01/09/21 with SOB, orthopnea. Workup for pulmonary edema. PMH includes ESRD (HD MWF), HTN, CAD, PVD, PE.   Clinical Impression  Patient evaluated by Physical Therapy with no further acute PT needs identified. PTA, pt mod indep mobilizing with rollator and electric scooter; lives with grandson. Today, pt mod indep with transfers and ambulation using rollator. All education has been completed and the patient has no further questions. Acute PT is signing off. Thank you for this referral.  SpO2 96% on RA with activity    Follow Up Recommendations No PT follow up    Equipment Recommendations  None recommended by PT    Recommendations for Other Services       Precautions / Restrictions Precautions Precautions: None Restrictions Weight Bearing Restrictions: No      Mobility  Bed Mobility Overal bed mobility: Modified Independent                  Transfers Overall transfer level: Independent Equipment used: None;4-wheeled walker                Ambulation/Gait Ambulation/Gait assistance: Modified independent (Device/Increase time) Gait Distance (Feet): 350 Feet Assistive device: 4-wheeled walker Gait Pattern/deviations: Step-through pattern;Decreased stride length;Trunk flexed Gait velocity: Decreased   General Gait Details: Slow, steady gait mod indep with rollator; distance limited by fatigue  Stairs            Wheelchair Mobility    Modified Rankin (Stroke Patients Only)       Balance Overall balance assessment: Mild deficits observed, not formally tested                                           Pertinent Vitals/Pain Pain Assessment: No/denies pain    Home Living Family/patient expects to be discharged to:: Private  residence Living Arrangements: Other relatives (grandson) Available Help at Discharge: Family Type of Home: House Home Access: Ramped entrance     Home Layout: One level Home Equipment: Environmental consultant - 4 wheels;Shower seat;Wheelchair - power      Prior Function Level of Independence: Independent with assistive device(s)   Gait / Transfers Assistance Needed: Mod indep short ambulation distances with rollator; uses ramp into/out of house; electric scooter for community mobility; medical transport to HD appointments           Hand Dominance        Extremity/Trunk Assessment   Upper Extremity Assessment Upper Extremity Assessment: Generalized weakness    Lower Extremity Assessment Lower Extremity Assessment: Generalized weakness       Communication      Cognition Arousal/Alertness: Awake/alert Behavior During Therapy: WFL for tasks assessed/performed Overall Cognitive Status: Within Functional Limits for tasks assessed                                        General Comments General comments (skin integrity, edema, etc.): SpO2 96% on RA    Exercises     Assessment/Plan    PT Assessment Patent does not need any further PT services  PT Problem List         PT Treatment Interventions  PT Goals (Current goals can be found in the Care Plan section)  Acute Rehab PT Goals PT Goal Formulation: All assessment and education complete, DC therapy    Frequency     Barriers to discharge        Co-evaluation               AM-PAC PT "6 Clicks" Mobility  Outcome Measure Help needed turning from your back to your side while in a flat bed without using bedrails?: None Help needed moving from lying on your back to sitting on the side of a flat bed without using bedrails?: None Help needed moving to and from a bed to a chair (including a wheelchair)?: None Help needed standing up from a chair using your arms (e.g., wheelchair or bedside chair)?:  None Help needed to walk in hospital room?: None Help needed climbing 3-5 steps with a railing? : A Little 6 Click Score: 23    End of Session   Activity Tolerance: Patient tolerated treatment well Patient left: in bed;with call bell/phone within reach Nurse Communication: Mobility status PT Visit Diagnosis: Other abnormalities of gait and mobility (R26.89)    Time: 1959-7471 PT Time Calculation (min) (ACUTE ONLY): 9 min   Charges:   PT Evaluation $PT Eval Low Complexity: [place], PT, DPT Acute Rehabilitation Services  Pager [phone] Office [place] 01/10/2021, 1:33 PM

## 2021-01-10 NOTE — Progress Notes (Signed)
  Echocardiogram 2D Echocardiogram has been performed.  Darlina Sicilian M 01/10/2021, 2:12 PM

## 2021-01-10 NOTE — Discharge Summary (Signed)
Physician Discharge Summary  Logan Martin ALP:[id number] DOB: Nov 13, 1955 DOA: 01/09/2021  PCP: [name], NP  Admit date: 01/09/2021 Discharge date: 01/10/2021  Admitted From: home Disposition:  home  Recommendations for Outpatient Follow-up:  Follow up with PCP in 1-2 weeks Please obtain BMP/CBC in one week Please follow up with nephrology  Outpatient dialysis tomorrow  Home Health: No  Equipment/Devices: None   Discharge Condition: Stable  CODE STATUS: Full  Diet recommendation: Renal with fluid restriction   Discharge Diagnoses: Principal Problem:   Pulmonary edema Active Problems:   ESRD on dialysis ([place])   CAD (coronary atherosclerotic disease)   Anemia in ESRD (end-stage renal disease) ([place])   Prolonged QT interval   Acute respiratory failure with hypoxia ([place])   Heart murmur   Dyspnea    Summary of HPI and Hospital Course:  Patient with ESRD on hemodialysis, soon to begin on home hemodialysis, admitted with flash pulmonary edema and acute respiratory distress, requiring BiPAP for respiratory support initially.  Nephrology was consulted and patient received HD urgent hemodialysis.       Patient's clinical condition improved dramatically following hemodialysis and excess volume removal.    Patient had marked clinical improvement faster than initially expected.  Anticipated need for further inpatient dialysis, but patient remained asymptomatic from respiratory standpoint and clinically improved after dialysis.  Nephrology evaluated the patient and agrees he is medically stable for discharge today, to attend routine dialysis session as outpatient tomorrow.     Discharge Instructions   Discharge Instructions     Call MD for:   Complete by: As directed    Worsening shortness of breath.   Call MD for:  extreme fatigue   Complete by: As directed    Call MD for:  persistant dizziness or light-headedness   Complete by: As directed    Call MD for:   persistant nausea and vomiting   Complete by: As directed    Call MD for:  temperature >100.4   Complete by: As directed    Diet - low sodium heart healthy   Complete by: As directed    Discharge wound care:   Complete by: As directed    Keep wound and surround area clean and dry.   Monitor closely. Offload pressure from the area frequently.   Increase activity slowly   Complete by: As directed    Increase activity slowly   Complete by: As directed    No wound care   Complete by: As directed       Allergies as of 01/10/2021       Reactions   Heparin Rash   Pork products    Pork-derived Products Rash   Sulfa Antibiotics Rash        Medication List     TAKE these medications    aspirin EC 81 MG tablet Take 81 mg by mouth at bedtime.   atorvastatin 40 MG tablet Commonly known as: LIPITOR Take 40 mg by mouth daily.   Auryxia 1 GM 210 MG(Fe) tablet Generic drug: ferric citrate Take 420 mg by mouth 3 (three) times daily.   calcitRIOL 0.25 MCG capsule Commonly known as: ROCALTROL Take 0.25 mcg by mouth daily.   cinacalcet 30 MG tablet Commonly known as: SENSIPAR Take 30 mg by mouth daily.   hydrALAZINE 25 MG tablet Commonly known as: APRESOLINE Take 1 tablet (25 mg total) by mouth 3 (three) times daily.   isosorbide mononitrate 30 MG 24 hr tablet Commonly known as: IMDUR Take  30 mg by mouth daily.   labetalol 100 MG tablet Commonly known as: NORMODYNE Take 100 mg by mouth 3 (three) times daily. Except on Mon Wed Fri take 2 tablets daily   lidocaine-prilocaine cream Commonly known as: EMLA Apply 1 application topically See admin instructions. Apply small amount to dialysis port (AVF) one hour before dialysis. Cover with occlusive dressing (saran wrap)   multivitamin with minerals tablet Take 1 tablet by mouth daily.   oxycodone 30 MG immediate release tablet Commonly known as: ROXICODONE Take 30 mg by mouth in the morning, at noon, and at bedtime.    Oxycodone HCl 10 MG Tabs Take 10 mg by mouth 2 (two) times daily as needed for moderate pain.   traZODone 150 MG tablet Commonly known as: DESYREL Take 150 mg by mouth at bedtime as needed for sleep.   Vitamin D (Ergocalciferol) 1.25 MG (50000 UNIT) Caps capsule Commonly known as: DRISDOL Take 50,000 Units by mouth once a week.               Discharge Care Instructions  (From admission, onward)           Start     Ordered   01/10/21 0000  Discharge wound care:       Comments: Keep wound and surround area clean and dry.   Monitor closely. Offload pressure from the area frequently.   01/10/21 1330            Allergies  Allergen Reactions   Heparin Rash    Pork products    Pork-Derived Products Rash   Sulfa Antibiotics Rash     If you experience worsening of your admission symptoms, develop shortness of breath, life threatening emergency, suicidal or homicidal thoughts you must seek medical attention immediately by calling 911 or calling your MD immediately  if symptoms less severe.    Please note   You were cared for by a hospitalist during your hospital stay. If you have any questions about your discharge medications or the care you received while you were in the hospital after you are discharged, you can call the unit and asked to speak with the hospitalist on call if the hospitalist that took care of you is not available. Once you are discharged, your primary care physician will handle any further medical issues. Please note that NO REFILLS for any discharge medications will be authorized once you are discharged, as it is imperative that you return to your primary care physician (or establish a relationship with a primary care physician if you do not have one) for your aftercare needs so that they can reassess your need for medications and monitor your lab values.   Consultations: Nephrology    Procedures/Studies: [facility] Chest Port 1 View  Result Date:  01/09/2021 CLINICAL DATA:  65 year old male with shortness of breath. End stage renal disease. EXAM: PORTABLE CHEST 1 VIEW COMPARISON:  Portable chest 08/26/2020 and earlier. FINDINGS: Stable lung volumes. Stable cardiomegaly and mediastinal contours. Chronic right innominate vein region vascular stent. Small to moderate new left pleural effusion, partially tracking into the left pleural fissures. Diffusely increased pulmonary vascularity. No pneumothorax. Probable small left pleural effusion. No air bronchograms. Calcified abdominal aortic atherosclerosis. No acute osseous abnormality identified. Paucity of bowel gas in the upper abdomen. IMPRESSION: 1. Acute pulmonary edema with up to moderate left and small right pleural effusions. 2. Stable cardiomegaly.  Aortic Atherosclerosis (ICD10-I70.0). Electronically Signed   By: Genevie Ann M.D.   On: 01/09/2021  04:29    Hemodialysis    Subjective: Pt doing well w\hen seen this AM.  Denies SOB, chest pain or other complaints.  Wants to return home with grandson today but states will need transportation.   Discharge Exam: Vitals:   01/10/21 0904 01/10/21 1048  BP: 123/65 (!) 127/52  Pulse: 84 86  Resp:  18  Temp:  98 F (36.7 C)  SpO2:  96%   Vitals:   01/10/21 0415 01/10/21 0815 01/10/21 0904 01/10/21 1048  BP: (!) 125/51 126/75 123/65 (!) 127/52  Pulse: 89 84 84 86  Resp: 20 18  18   Temp: (!) 97.5 F (36.4 C) 98.5 F (36.9 C)  98 F (36.7 C)  TempSrc: Oral Oral  Oral  SpO2: 100% 95%  96%  Weight: 46.6 kg     Height:        General: Pt is alert, awake, not in acute distress, underweight Cardiovascular: RRR, S1/S2 +, no rubs, no gallops Respiratory: CTA bilaterally, no wheezing, no rhonchi Abdominal: Soft, NT, ND, bowel sounds + Extremities: no edema, no cyanosis    The results of significant diagnostics from this hospitalization (including imaging, microbiology, ancillary and laboratory) are listed below for reference.      Microbiology: Recent Results (from the past 240 hour(s))  Resp Panel by RT-PCR (Flu A&B, Covid) Nasopharyngeal Swab     Status: None   Collection Time: 01/09/21  4:15 AM   Specimen: Nasopharyngeal Swab; Nasopharyngeal(NP) swabs in vial transport medium  Result Value Ref Range Status   SARS Coronavirus 2 by RT PCR NEGATIVE NEGATIVE Final    Comment: (NOTE) SARS-CoV-2 target nucleic acids are NOT DETECTED.  The SARS-CoV-2 RNA is generally detectable in upper respiratory specimens during the acute phase of infection. The lowest concentration of SARS-CoV-2 viral copies this assay can detect is 138 copies/mL. A negative result does not preclude SARS-Cov-2 infection and should not be used as the sole basis for treatment or other patient management decisions. A negative result may occur with  improper specimen collection/handling, submission of specimen other than nasopharyngeal swab, presence of viral mutation(s) within the areas targeted by this assay, and inadequate number of viral copies(<138 copies/mL). A negative result must be combined with clinical observations, patient history, and epidemiological information. The expected result is Negative.  Fact Sheet for Patients:  EntrepreneurPulse.com.au  Fact Sheet for Healthcare Providers:  IncredibleEmployment.be  This test is no t yet approved or cleared by the [place] FDA and  has been authorized for detection and/or diagnosis of SARS-CoV-2 by FDA under an Emergency Use Authorization (EUA). This EUA will remain  in effect (meaning this test can be used) for the duration of the COVID-19 declaration under Section 564(b)(1) of the Act, 21 U.S.C.section 360bbb-3(b)(1), unless the authorization is terminated  or revoked sooner.       Influenza A by PCR NEGATIVE NEGATIVE Final   Influenza B by PCR NEGATIVE NEGATIVE Final    Comment: (NOTE) The Xpert Xpress SARS-CoV-2/FLU/RSV plus assay is  intended as an aid in the diagnosis of influenza from Nasopharyngeal swab specimens and should not be used as a sole basis for treatment. Nasal washings and aspirates are unacceptable for Xpert Xpress SARS-CoV-2/FLU/RSV testing.  Fact Sheet for Patients: EntrepreneurPulse.com.au  Fact Sheet for Healthcare Providers: IncredibleEmployment.be  This test is not yet approved or cleared by the [place] FDA and has been authorized for detection and/or diagnosis of SARS-CoV-2 by FDA under an Emergency Use Authorization (EUA). This EUA will remain  in effect (meaning this test can be used) for the duration of the COVID-19 declaration under Section 564(b)(1) of the Act, 21 U.S.C. section 360bbb-3(b)(1), unless the authorization is terminated or revoked.  Performed at [facility], [place] [address], [place], [place] [address]   MRSA Next Gen by PCR, Nasal     Status: None   Collection Time: 01/09/21 10:13 PM   Specimen: Nasal Mucosa; Nasal Swab  Result Value Ref Range Status   MRSA by PCR Next Gen NOT DETECTED NOT DETECTED Final    Comment: (NOTE) The GeneXpert MRSA Assay (FDA approved for NASAL specimens only), is one component of a comprehensive MRSA colonization surveillance program. It is not intended to diagnose MRSA infection nor to guide or monitor treatment for MRSA infections. Test performance is not FDA approved in patients less than 55 years old. Performed at [facility], [place] [address], [place], [place] [address]      Labs: BNP (last 3 results) Recent Labs    05/05/20 1602  BNP 2,500.3*   Basic Metabolic Panel: Recent Labs  Lab 01/09/21 0400 01/09/21 0453 01/10/21 0424  NA 139 138 135  K 4.0 4.0 4.4  CL 98 101 98  CO2 24  --  27  GLUCOSE 152* 136* 81  BUN 61* 69* 32*  CREATININE 8.77* 9.10* 6.08*  CALCIUM 8.7*  --  9.1   Liver Function Tests: No results for input(s): AST, ALT, ALKPHOS, BILITOT, PROT,  ALBUMIN in the last 168 hours. No results for input(s): LIPASE, AMYLASE in the last 168 hours. No results for input(s): AMMONIA in the last 168 hours. CBC: Recent Labs  Lab 01/09/21 0400 01/09/21 0453  WBC 13.0*  --   NEUTROABS 2.6  --   HGB 9.9* 10.9*  HCT 32.5* 32.0*  MCV 104.2*  --   PLT 183  --    Cardiac Enzymes: No results for input(s): CKTOTAL, CKMB, CKMBINDEX, TROPONINI in the last 168 hours. BNP: Invalid input(s): POCBNP CBG: No results for input(s): GLUCAP in the last 168 hours. D-Dimer No results for input(s): DDIMER in the last 72 hours. Hgb A1c No results for input(s): HGBA1C in the last 72 hours. Lipid Profile No results for input(s): CHOL, HDL, LDLCALC, TRIG, CHOLHDL, LDLDIRECT in the last 72 hours. Thyroid function studies Recent Labs    01/09/21 0655  TSH 2.353   Anemia work up No results for input(s): VITAMINB12, FOLATE, FERRITIN, TIBC, IRON, RETICCTPCT in the last 72 hours. Urinalysis    Component Value Date/Time   COLORURINE STRAW (A) 03/28/2018 0300   APPEARANCEUR CLEAR 03/28/2018 0300   LABSPEC 1.000 (L) 03/28/2018 0300   PHURINE 6.0 03/28/2018 0300   GLUCOSEU NEGATIVE 03/28/2018 0300   HGBUR NEGATIVE 03/28/2018 0300   BILIRUBINUR NEGATIVE 03/28/2018 0300   KETONESUR NEGATIVE 03/28/2018 0300   PROTEINUR NEGATIVE 03/28/2018 0300   NITRITE NEGATIVE 03/28/2018 0300   LEUKOCYTESUR NEGATIVE 03/28/2018 0300   Sepsis Labs Invalid input(s): PROCALCITONIN,  WBC,  LACTICIDVEN Microbiology Recent Results (from the past 240 hour(s))  Resp Panel by RT-PCR (Flu A&B, Covid) Nasopharyngeal Swab     Status: None   Collection Time: 01/09/21  4:15 AM   Specimen: Nasopharyngeal Swab; Nasopharyngeal(NP) swabs in vial transport medium  Result Value Ref Range Status   SARS Coronavirus 2 by RT PCR NEGATIVE NEGATIVE Final    Comment: (NOTE) SARS-CoV-2 target nucleic acids are NOT DETECTED.  The SARS-CoV-2 RNA is generally detectable in upper  respiratory specimens during the acute  phase of infection. The lowest concentration of SARS-CoV-2 viral copies this assay can detect is 138 copies/mL. A negative result does not preclude SARS-Cov-2 infection and should not be used as the sole basis for treatment or other patient management decisions. A negative result may occur with  improper specimen collection/handling, submission of specimen other than nasopharyngeal swab, presence of viral mutation(s) within the areas targeted by this assay, and inadequate number of viral copies(<138 copies/mL). A negative result must be combined with clinical observations, patient history, and epidemiological information. The expected result is Negative.  Fact Sheet for Patients:  EntrepreneurPulse.com.au  Fact Sheet for Healthcare Providers:  IncredibleEmployment.be  This test is no t yet approved or cleared by the [place] FDA and  has been authorized for detection and/or diagnosis of SARS-CoV-2 by FDA under an Emergency Use Authorization (EUA). This EUA will remain  in effect (meaning this test can be used) for the duration of the COVID-19 declaration under Section 564(b)(1) of the Act, 21 U.S.C.section 360bbb-3(b)(1), unless the authorization is terminated  or revoked sooner.       Influenza A by PCR NEGATIVE NEGATIVE Final   Influenza B by PCR NEGATIVE NEGATIVE Final    Comment: (NOTE) The Xpert Xpress SARS-CoV-2/FLU/RSV plus assay is intended as an aid in the diagnosis of influenza from Nasopharyngeal swab specimens and should not be used as a sole basis for treatment. Nasal washings and aspirates are unacceptable for Xpert Xpress SARS-CoV-2/FLU/RSV testing.  Fact Sheet for Patients: EntrepreneurPulse.com.au  Fact Sheet for Healthcare Providers: IncredibleEmployment.be  This test is not yet approved or cleared by the [place] FDA and has been  authorized for detection and/or diagnosis of SARS-CoV-2 by FDA under an Emergency Use Authorization (EUA). This EUA will remain in effect (meaning this test can be used) for the duration of the COVID-19 declaration under Section 564(b)(1) of the Act, 21 U.S.C. section 360bbb-3(b)(1), unless the authorization is terminated or revoked.  Performed at [facility], [place] [address] [place], [place] [address]   MRSA Next Gen by PCR, Nasal     Status: None   Collection Time: 01/09/21 10:13 PM   Specimen: Nasal Mucosa; Nasal Swab  Result Value Ref Range Status   MRSA by PCR Next Gen NOT DETECTED NOT DETECTED Final    Comment: (NOTE) The GeneXpert MRSA Assay (FDA approved for NASAL specimens only), is one component of a comprehensive MRSA colonization surveillance program. It is not intended to diagnose MRSA infection nor to guide or monitor treatment for MRSA infections. Test performance is not FDA approved in patients less than 9 years old. Performed at [facility], [place] [address], [place], [place] [address]      Time coordinating discharge: Over 30 minutes  SIGNED:   Ezekiel Slocumb, DO [facility] 01/10/2021, 1:30 PM   If 7PM-7AM, please contact night-coverage [email or web address]

## 2021-01-10 NOTE — Progress Notes (Signed)
  [facility] Progress Note   Assessment/ Plan:   1. SOB/ hypoxia- improved with HD.  Now on RA.  Trops flat, EKG without acute changes 2.ESRD MWF normally.  Doing in-center HD now at [facility] and transitioning back to [facility] 8/22. 3. Anemia: ESA as OP 4. CKD-MBD: binders and vitamins 5. Hypertension/ volume- needs EDW adjusted- 46.5 should be new EDW, EDW as OP 47.5.   6.  Dispo: OK to go from renal perspective with resumption of normal OP schedule tomorrow.    Subjective:    Had HD yesterday, 2.5L off.  Feeling much better and on RA now.  Eager to go home.     Objective:   BP (!) 127/52 (BP Location: Right Leg)   Pulse 86   Temp 98 F (36.7 C) (Oral)   Resp 18   Ht 5\' 4"  (1.626 m)   Wt 46.6 kg   SpO2 96%   BMI 17.63 kg/m   Physical Exam: Gen: sitting up in bed. NAD CVS: RRR Resp: clear Abd: nontender Ext: no LE edema  Labs: BMET Recent Labs  Lab 01/09/21 0400 01/09/21 0453 01/10/21 0424  NA 139 138 135  K 4.0 4.0 4.4  CL 98 101 98  CO2 24  --  27  GLUCOSE 152* 136* 81  BUN 61* 69* 32*  CREATININE 8.77* 9.10* 6.08*  CALCIUM 8.7*  --  9.1   CBC Recent Labs  Lab 01/09/21 0400 01/09/21 0453  WBC 13.0*  --   NEUTROABS 2.6  --   HGB 9.9* 10.9*  HCT 32.5* 32.0*  MCV 104.2*  --   PLT 183  --       Medications:     atorvastatin  40 mg Oral Daily   calcitRIOL  0.25 mcg Oral Daily   Chlorhexidine Gluconate Cloth  6 each Topical Q0600   cinacalcet  30 mg Oral Daily   senna-docusate  2 tablet Oral QHS   sodium chloride flush  3 mL Intravenous Q12H    Madelon Lips MD 01/10/2021, 11:37 AM

## 2021-01-10 NOTE — TOC Initial Note (Signed)
Transition of Care [facility]) - Initial/Assessment Note    Patient Details  Name: Logan Martin MRN: [id number] Date of Birth: 11-Mar-1956  Transition of Care [facility]) CM/SW Contact:    Zenon Mayo, RN Phone Number: 01/10/2021, 1:54 PM  Clinical Narrative:                 Patient is for dc home today, he will need transportation home, we will assist with [facility] for patient.  NCM also supplied patient with clothing from the clothing closet.   Expected Discharge Plan: Home/Self Care Barriers to Discharge: No Barriers Identified   Patient Goals and CMS Choice Patient states their goals for this hospitalization and ongoing recovery are:: return home   Choice offered to / list presented to : NA  Expected Discharge Plan and Services Expected Discharge Plan: Home/Self Care In-house Referral: NA Discharge Planning Services: CM Consult Post Acute Care Choice: NA Living arrangements for the past 2 months: Single Family Home Expected Discharge Date: 01/10/21                 DME Agency: NA       HH Arranged: NA          Prior Living Arrangements/Services Living arrangements for the past 2 months: Single Family Home Lives with:: Relatives Patient language and need for interpreter reviewed:: Yes Do you feel safe going back to the place where you live?: Yes      Need for Family Participation in Patient Care: No (Comment) Care giver support system in place?: No (comment)   Criminal Activity/Legal Involvement Pertinent to Current Situation/Hospitalization: No - Comment as needed  Activities of Daily Living      Permission Sought/Granted                  Emotional Assessment Appearance:: Appears stated age Attitude/Demeanor/Rapport: Engaged Affect (typically observed): Appropriate Orientation: : Oriented to Self, Oriented to Place, Oriented to  Time, Oriented to Situation Alcohol / Substance Use: Not Applicable Psych Involvement: No  (comment)  Admission diagnosis:  Acute pulmonary edema ([place]) [J81.0] Pulmonary edema [J81.1] ESRD (end stage renal disease) ([place]) [N18.6] Acute respiratory failure with hypoxia ([place]) [J96.01] Patient Active Problem List   Diagnosis Date Noted   Pulmonary edema 01/09/2021   Heart murmur 01/09/2021   Dyspnea 01/09/2021   Anemia of chronic disease 01/09/2021   Diarrhea, unspecified 09/06/2020   Hereditary nephropathy, 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 09/06/2020   Coagulation defect, unspecified ([place]) 09/06/2020   Pruritus, unspecified 09/06/2020   Anaphylactic shock, unspecified, initial encounter 09/02/2020   Pressure injury of skin 08/28/2020   Other psychoactive substance abuse with intoxication, uncomplicated ([place]) 12/45/8099   Abdominal pain    Acute respiratory failure with hypoxia ([place])    Tachycardia 08/20/2020   Oral infection 08/20/2020   Other specified diseases of liver 06/10/2020   Non-ST elevation (NSTEMI) myocardial infarction ([place])    ESRD (end stage renal disease) on dialysis ([place]) 05/05/2020   Prolonged QT interval 05/05/2020   Allergy, unspecified, sequela 03/18/2020   Pain, unspecified 03/18/2020   Personal history of anaphylaxis 03/18/2020   Cellulitis 03/10/2020   Chronic low back pain    Peripheral vascular disease ([place])    Other mechanical complication of surgically created arteriovenous fistula, initial encounter ([place]) 03/09/2020   Unspecified open wound of right upper arm, subsequent encounter 03/09/2020   Hypercalcemia 01/20/2020   Other disorders of phosphorus metabolism 01/01/2020   Severe mitral regurgitation 12/22/2018  Hypercholesterolemia 12/22/2018   History of bladder cancer 08/14/2018   Personal history of pulmonary embolism 08/14/2018   Red blood cell antibody positive, compatible PRBC difficult to obtain 08/14/2018   Malignant neoplasm of overlapping sites of bladder ([place]) 07/20/2018   Volume overload 06/18/2018    Fluid overload 06/17/2018   Elevated troponin 06/17/2018   Hematuria 03/27/2018   Hypokalemia 03/27/2018   Thrombocytopenia ([place]) 03/27/2018   Anemia in ESRD (end-stage renal disease) ([place]) 03/27/2018   Bladder tumor 03/27/2018   Encounter for adequacy testing for peritoneal dialysis ([place]) 01/03/2018   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   Compression of vein 11/12/2017   Hypertension with heart disease 10/14/2017   Pyelocystitis 10/13/2017   Chronic mastoiditis of both sides 06/19/2017   Cramp and spasm 02/20/2017   Acidosis 02/18/2017   Dependence on renal dialysis ([place]) 02/18/2017   Hypertensive heart and chronic kidney disease with heart failure and with stage 5 chronic kidney disease, or end stage renal disease ([place]) 02/18/2017   Iron deficiency anemia, unspecified 02/18/2017   Moderate protein-calorie malnutrition ([place]) 02/18/2017   Other dietary vitamin B12 deficiency anemia 02/18/2017   Other disorders of electrolyte and fluid balance, not elsewhere classified 02/18/2017   Other disorders resulting from impaired renal tubular function 02/18/2017   Personal history of nicotine dependence 02/18/2017   Cardiomegaly 01/21/2017   Congestive heart failure ([place]) 01/21/2017   Pancytopenia ([place]) 06/14/2016   Left arm pain 06/14/2016   SVC (superior vena cava obstruction) 06/14/2016   Mechanical complication of other vascular device, implant, and graft 08/10/2013   End stage renal disease ([place]) 08/10/2013   ESRD (end stage renal disease) ([place]) 06/07/2013   Nausea and vomiting 06/07/2013   SOB (shortness of breath) 05/10/2013   Chest discomfort 11/27/2012   Shortness of breath 11/27/2012   Chronic hepatitis C without hepatic coma ([place]) 02/04/2012   Secondary hyperparathyroidism (of renal origin) 02/04/2012   Anemia associated with chronic renal failure 02/04/2012   CAD (coronary atherosclerotic disease) 02/04/2012   Pulmonary embolism ([place]) 07/20/2010   Chronic pain syndrome 06/08/2010    Renal failure 04/26/2010   Atherosclerosis of native coronary artery of native heart without angina pectoris 02/23/2009   HEMORRHOIDS, INTERNAL 02/23/2009   GERD 02/23/2009   ESRD on dialysis ([place]) 02/16/2009   Allergic rhinitis 05/06/2008   PCP:  [name], NP Pharmacy:   [facility] [place] [address] [place] [address] Phone: [phone] Fax: [phone]     Social Determinants of Health (SDOH) Interventions    Readmission Risk Interventions Readmission Risk Prevention Plan 01/10/2021  Transportation Screening Complete  Medication Review ([place]) Complete  PCP or Specialist appointment within 3-5 days of discharge Complete  HRI or [place] Complete  SW Recovery Care/Counseling Consult Complete  [place] Not Applicable  Some recent data might be hidden

## 2021-01-10 NOTE — TOC Transition Note (Signed)
Transition of Care [facility]) - CM/SW Discharge Note   Patient Details  Name: Logan Martin MRN: [id number] Date of Birth: 1956/01/25  Transition of Care [facility]) CM/SW Contact:  Zenon Mayo, RN Phone Number: 01/10/2021, 1:58 PM   Clinical Narrative:    Patient is for dc home today, he will need transportation home, we will assist with [facility] for patient.  NCM also supplied patient with clothing from the clothing closet.      Final next level of care: Home/Self Care Barriers to Discharge: No Barriers Identified   Patient Goals and CMS Choice Patient states their goals for this hospitalization and ongoing recovery are:: return home   Choice offered to / list presented to : NA  Discharge Placement                       Discharge Plan and Services In-house Referral: NA Discharge Planning Services: CM Consult Post Acute Care Choice: NA            DME Agency: NA       HH Arranged: NA          Social Determinants of Health (SDOH) Interventions     Readmission Risk Interventions Readmission Risk Prevention Plan 01/10/2021  Transportation Screening Complete  Medication Review Press photographer) Complete  PCP or Specialist appointment within 3-5 days of discharge Complete  HRI or [place] Complete  SW Recovery Care/Counseling Consult Complete  [place] Not Applicable  Some recent data might be hidden

## 2021-01-11 ENCOUNTER — Telehealth: Payer: Self-pay | Admitting: Nephrology

## 2021-01-11 NOTE — Telephone Encounter (Signed)
Transition of care contact from inpatient facility  Date of discharge: 01/10/21 Date of contact: 01/11/21 Method: Phone Spoke to: Patient  Patient contacted to discuss transition of care from recent inpatient hospitalization. Patient was admitted to [facility] from 8/15-8/16/22 with discharge diagnosis of pulmonary edema.   Medication changes were reviewed. No changes   Patient will follow up with his/her outpatient HD unit on: He went to his scheduled dialysis session today.

## 2021-01-18 ENCOUNTER — Emergency Department (HOSPITAL_COMMUNITY): Payer: Medicare HMO

## 2021-01-18 ENCOUNTER — Encounter (HOSPITAL_COMMUNITY): Payer: Self-pay | Admitting: *Deleted

## 2021-01-18 ENCOUNTER — Emergency Department (HOSPITAL_COMMUNITY)
Admission: EM | Admit: 2021-01-18 | Discharge: 2021-01-18 | Disposition: A | Discharge status: Home / Self Care | Payer: Medicare HMO | Attending: Emergency Medicine | Admitting: Emergency Medicine

## 2021-01-18 ENCOUNTER — Other Ambulatory Visit: Payer: Self-pay

## 2021-01-18 DIAGNOSIS — Z992 Dependence on renal dialysis: Secondary | ICD-10-CM | POA: Insufficient documentation

## 2021-01-18 DIAGNOSIS — R0789 Other chest pain: Secondary | ICD-10-CM | POA: Insufficient documentation

## 2021-01-18 DIAGNOSIS — I132 Hypertensive heart and chronic kidney disease with heart failure and with stage 5 chronic kidney disease, or end stage renal disease: Secondary | ICD-10-CM | POA: Insufficient documentation

## 2021-01-18 DIAGNOSIS — Z951 Presence of aortocoronary bypass graft: Secondary | ICD-10-CM | POA: Diagnosis not present

## 2021-01-18 DIAGNOSIS — I10 Essential (primary) hypertension: Secondary | ICD-10-CM

## 2021-01-18 DIAGNOSIS — Z8551 Personal history of malignant neoplasm of bladder: Secondary | ICD-10-CM | POA: Diagnosis not present

## 2021-01-18 DIAGNOSIS — Z79899 Other long term (current) drug therapy: Secondary | ICD-10-CM | POA: Insufficient documentation

## 2021-01-18 DIAGNOSIS — I509 Heart failure, unspecified: Secondary | ICD-10-CM | POA: Diagnosis not present

## 2021-01-18 DIAGNOSIS — D631 Anemia in chronic kidney disease: Secondary | ICD-10-CM | POA: Diagnosis not present

## 2021-01-18 DIAGNOSIS — Z87891 Personal history of nicotine dependence: Secondary | ICD-10-CM | POA: Insufficient documentation

## 2021-01-18 DIAGNOSIS — N186 End stage renal disease: Secondary | ICD-10-CM | POA: Diagnosis not present

## 2021-01-18 DIAGNOSIS — I2510 Atherosclerotic heart disease of native coronary artery without angina pectoris: Secondary | ICD-10-CM | POA: Insufficient documentation

## 2021-01-18 DIAGNOSIS — R079 Chest pain, unspecified: Secondary | ICD-10-CM

## 2021-01-18 DIAGNOSIS — Z7982 Long term (current) use of aspirin: Secondary | ICD-10-CM | POA: Insufficient documentation

## 2021-01-18 LAB — CBC WITH DIFFERENTIAL/PLATELET
Abs Immature Granulocytes: 0.02 10*3/uL (ref 0.00–0.07)
Basophils Absolute: 0 10*3/uL (ref 0.0–0.1)
Basophils Relative: 1 %
Eosinophils Absolute: 0.7 10*3/uL — ABNORMAL HIGH (ref 0.0–0.5)
Eosinophils Relative: 10 %
HCT: 32.1 % — ABNORMAL LOW (ref 39.0–52.0)
Hemoglobin: 10 g/dL — ABNORMAL LOW (ref 13.0–17.0)
Immature Granulocytes: 0 %
Lymphocytes Relative: 36 %
Lymphs Abs: 2.4 10*3/uL (ref 0.7–4.0)
MCH: 32.1 pg (ref 26.0–34.0)
MCHC: 31.2 g/dL (ref 30.0–36.0)
MCV: 102.9 fL — ABNORMAL HIGH (ref 80.0–100.0)
Monocytes Absolute: 0.8 10*3/uL (ref 0.1–1.0)
Monocytes Relative: 11 %
Neutro Abs: 2.8 10*3/uL (ref 1.7–7.7)
Neutrophils Relative %: 42 %
Platelets: 187 10*3/uL (ref 150–400)
RBC: 3.12 MIL/uL — ABNORMAL LOW (ref 4.22–5.81)
RDW: 19.9 % — ABNORMAL HIGH (ref 11.5–15.5)
WBC: 6.6 10*3/uL (ref 4.0–10.5)
nRBC: 2.3 % — ABNORMAL HIGH (ref 0.0–0.2)

## 2021-01-18 LAB — COMPREHENSIVE METABOLIC PANEL
ALT: 17 U/L (ref 0–44)
AST: 24 U/L (ref 15–41)
Albumin: 3.2 g/dL — ABNORMAL LOW (ref 3.5–5.0)
Alkaline Phosphatase: 118 U/L (ref 38–126)
Anion gap: 13 (ref 5–15)
BUN: 56 mg/dL — ABNORMAL HIGH (ref 8–23)
CO2: 26 mmol/L (ref 22–32)
Calcium: 8.9 mg/dL (ref 8.9–10.3)
Chloride: 97 mmol/L — ABNORMAL LOW (ref 98–111)
Creatinine, Ser: 9.53 mg/dL — ABNORMAL HIGH (ref 0.61–1.24)
GFR, Estimated: 6 mL/min — ABNORMAL LOW (ref >60)
Glucose, Bld: 104 mg/dL — ABNORMAL HIGH (ref 70–99)
Potassium: 4.9 mmol/L (ref 3.5–5.1)
Sodium: 136 mmol/L (ref 135–145)
Total Bilirubin: 1 mg/dL (ref 0.3–1.2)
Total Protein: 6.8 g/dL (ref 6.5–8.1)

## 2021-01-18 LAB — TROPONIN I (HIGH SENSITIVITY)
Troponin I (High Sensitivity): 58 ng/L — ABNORMAL HIGH (ref <18)
Troponin I (High Sensitivity): 62 ng/L — ABNORMAL HIGH (ref <18)

## 2021-01-18 NOTE — ED Provider Notes (Signed)
Emergency Medicine Provider Triage Evaluation Note  Logan Martin , a 65 y.o. male  was evaluated in triage.  Pt complains of chest tightness which began this morning while trying to go to dialysis.  According to patient, he has a request in order to have supplemental oxygen, however currently does not wear any O2 at home.  Also endorses substernal chest pain, which he feels "like the last time he had a clot in one of my arteries".  Review of Systems  Positive: Chest pain, shortness of breath Negative: fever  Physical Exam  BP (!) 135/96   Pulse (!) 103   Temp 98.3 F (36.8 C) (Oral)   Resp 15   SpO2 96%  Gen:   Chronically ill appearing  Resp:  Normal effort  MSK:   Moves extremities without difficulty  Other:    Medical Decision Making  Medically screening exam initiated at 10:26 AM.  Appropriate orders placed.  [name] was informed that the remainder of the evaluation will be completed by another provider, this initial triage assessment does not replace that evaluation, and the importance of remaining in the ED until their evaluation is complete.  Patient on ESRD supposed to go today, however came here instead, with complaints of chest tightness, prior review with a recent admission on 15 August of this month for acute respiratory failure.  Labs and EKG have been ordered.    Janeece Fitting, PA-C 01/18/21 1032    Daleen Bo, MD 01/18/21 [phone]

## 2021-01-18 NOTE — ED Triage Notes (Signed)
Pt reports getting up for dialysis this am and had mid chest tightness and sob. No resp distress is noted at triage.

## 2021-01-18 NOTE — ED Provider Notes (Signed)
[facility] EMERGENCY DEPARTMENT Provider Note   CSN: [id number] Arrival date & time: 01/18/21  1001     History Chief Complaint  Patient presents with   Chest Pain   Shortness of Breath    Logan Martin is a 65 y.o. male.  HPI He presents for evaluation of chest pain which he describes as "chest tightness,",which started today as he was preparing to go to dialysis.  He noticed the discomfort when he awoke this morning.  He has had similar pain previously when he was having a problem with his heart.  He was hospitalized with acute respiratory failure, and 01/09/2021.  He was discharged the next day.  He apparently had rapid recovery following hemodialysis.  Patient typically goes to dialysis regularly.  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  That apparently followed a colonoscopy several months ago.  He denies cough, nausea, vomiting, focal weakness or paresthesia.  He does not currently have a cardiologist.  There are no other known active modifying factors.    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 Started HD in [place], [place].  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  On NxStage home hemo since 2010. Using RUA AVF.     Hearing aid worn    B/L   Hepatitis C    s/p treatment with Mavyret 03/2017.  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 09/06/2020   Coagulation defect, unspecified ([place]) 09/06/2020   Pruritus, unspecified 09/06/2020   Anaphylactic shock, unspecified, initial encounter 09/02/2020   Pressure injury of skin 08/28/2020   Other psychoactive substance abuse with intoxication, uncomplicated ([place]) 79/89/2119   Abdominal pain    Acute respiratory failure with hypoxia (HCC)    Tachycardia 08/20/2020   Oral infection 08/20/2020   Other specified diseases of liver 06/10/2020   Non-ST elevation (NSTEMI) myocardial infarction ([place])    ESRD (end stage renal disease) on dialysis ([place]) 05/05/2020   Prolonged QT interval 05/05/2020   Allergy, unspecified, sequela 03/18/2020   Pain, unspecified 03/18/2020   Personal history of anaphylaxis 03/18/2020   Cellulitis 03/10/2020   Chronic low back pain    Peripheral vascular disease ([place])    Other mechanical complication of surgically created arteriovenous fistula, initial encounter ([place]) 03/09/2020   Unspecified open wound of right upper arm, subsequent encounter 03/09/2020   Hypercalcemia 01/20/2020   Other  disorders of phosphorus metabolism 01/01/2020   Severe mitral regurgitation 12/22/2018   Hypercholesterolemia 12/22/2018   History of bladder cancer 08/14/2018   Personal history of pulmonary embolism 08/14/2018   Red blood cell antibody positive, compatible PRBC difficult to obtain 08/14/2018   Malignant neoplasm of overlapping sites of bladder ([place]) 07/20/2018   Volume overload 06/18/2018   Fluid overload 06/17/2018   Elevated troponin 06/17/2018   Hematuria 03/27/2018   Hypokalemia 03/27/2018   Thrombocytopenia ([place]) 03/27/2018   Anemia in ESRD  (end-stage renal disease) ([place]) 03/27/2018   Bladder tumor 03/27/2018   Encounter for adequacy testing for peritoneal dialysis ([place]) 01/03/2018   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   Compression of vein 11/12/2017   Hypertension with heart disease 10/14/2017   Pyelocystitis 10/13/2017   Chronic mastoiditis of both sides 06/19/2017   Cramp and spasm 02/20/2017   Acidosis 02/18/2017   Dependence on renal dialysis ([place]) 02/18/2017   Hypertensive heart and chronic kidney disease with heart failure and with stage 5 chronic kidney disease, or end stage renal disease ([place]) 02/18/2017   Iron deficiency anemia, unspecified 02/18/2017   Moderate protein-calorie malnutrition ([place]) 02/18/2017   Other dietary vitamin B12 deficiency anemia 02/18/2017   Other disorders of electrolyte and fluid balance, not elsewhere classified 02/18/2017   Other disorders resulting from impaired renal tubular function 02/18/2017   Personal history of nicotine dependence 02/18/2017   Cardiomegaly 01/21/2017   Congestive heart failure ([place]) 01/21/2017   Pancytopenia ([place]) 06/14/2016   Left arm pain 06/14/2016   SVC (superior vena cava obstruction) 06/14/2016   Mechanical complication of other vascular device, implant, and graft 08/10/2013   End stage renal disease ([place]) 08/10/2013   ESRD (end stage renal disease) ([place]) 06/07/2013   Nausea and vomiting 06/07/2013   SOB (shortness of breath) 05/10/2013   Chest discomfort 11/27/2012   Shortness of breath 11/27/2012   Chronic hepatitis C without hepatic coma ([place]) 02/04/2012   Secondary hyperparathyroidism (of renal origin) 02/04/2012   Anemia associated with chronic renal failure 02/04/2012   CAD (coronary atherosclerotic disease) 02/04/2012   Pulmonary embolism ([place]) 07/20/2010   Chronic pain syndrome 06/08/2010   Renal failure 04/26/2010   Atherosclerosis of native coronary artery of native heart without angina pectoris 02/23/2009   HEMORRHOIDS, INTERNAL 02/23/2009   GERD  02/23/2009   ESRD on dialysis ([place]) 02/16/2009   Allergic rhinitis 05/06/2008    Past Surgical History:  Procedure Laterality Date   A/V FISTULAGRAM N/A 11/22/2017   Procedure: A/V Fistulagram;  Surgeon: Serafina Mitchell, MD;  Location: [place] CV LAB;  Service: Cardiovascular;  Laterality: N/A;  rt. arm    ABDOMINAL HERNIA REPAIR  [id number]    x 2   APPLICATION OF WOUND VAC N/A 08/23/2020   Procedure: APPLICATION OF ABDOMINAL WOUND VAC;  Surgeon: Greer Pickerel, MD;  Location: [place];  Service: General;  Laterality: N/A;   AV FISTULA PLACEMENT Right 2007   upper arm   AV FISTULA REPAIR Right ~ 03/2016 & 04/2016   BLADDER AUGMENTATION  1975   COLONOSCOPY     COLONOSCOPY WITH PROPOFOL N/A 08/22/2020   Procedure: COLONOSCOPY WITH PROPOFOL;  Surgeon: Jackquline Denmark, MD;  Location[facility] ENDOSCOPY;  Service: Endoscopy;  Laterality: N/A;   CORONARY ANGIOPLASTY WITH STENT PLACEMENT  1999   CYSTOSCOPY W/ URETERAL STENT PLACEMENT N/A 10/13/2017   Procedure: CYSTOSCOPY WITH IRRIGATION OF BLADDER PLACEMENT OF [place];  Surgeon: Ceasar Mons, MD;  Location: [place];  Service: Urology;  Laterality: N/A;  CYSTOSCOPY WITH FULGERATION N/A 03/27/2018   Procedure: CYSTOSCOPY WITH FULGERATION AND MUCOUS EVACUATION, BLADDER BIOPSY;  Surgeon: Franchot Gallo, MD;  Location: [facility];  Service: Urology;  Laterality: N/A;   ESOPHAGOGASTRODUODENOSCOPY     ESOPHAGOGASTRODUODENOSCOPY N/A 08/24/2020   Procedure: ESOPHAGOGASTRODUODENOSCOPY (EGD);  Surgeon: Jackquline Denmark, MD;  Location: [facility] ENDOSCOPY;  Service: Endoscopy;  Laterality: N/A;   EXTRACORPOREAL SHOCK WAVE LITHOTRIPSY     FISTULA SUPERFICIALIZATION Right 0/09/3974   Procedure: PLICATION OF ANEURYSM OF ARTERIOVENOUS FISTULA;  Surgeon: Angelia Mould, MD;  Location: [place];  Service: Vascular;  Laterality: Right;   HERNIA REPAIR     INGUINAL HERNIA REPAIR Right [place] Left 03/04/2019   Procedure: LEFT OPEN INGUINAL HERNIA  REPAIR WITH MESH;  Surgeon: Kinsinger, Arta Bruce, MD;  Location: [place];  Service: General;  Laterality: Left;  GENERAL AND LMA   INSERTION OF DIALYSIS CATHETER Left 03/15/2020   Procedure: INSERTION OF Left Internal Jugular DIALYSIS CATHETER;  Surgeon: [name], MD;  Location: [place];  Service: Vascular;  Laterality: Left;   IR FLUORO GUIDE CV LINE LEFT  03/11/2020   IR FLUORO GUIDE CV LINE LEFT  08/20/2020   IR US GUIDE VASC ACCESS LEFT  03/11/2020   IR US GUIDE VASC ACCESS LEFT  08/20/2020   IR US GUIDE VASC ACCESS RIGHT  08/20/2020   IR VENO/JUGULAR RIGHT  08/20/2020   LAPAROTOMY N/A 08/23/2020   Procedure: EXPLORATORY LAPAROTOMY;  Surgeon: Greer Pickerel, MD;  Location: [place];  Service: General;  Laterality: N/A;   LAPAROTOMY N/A 08/25/2020   Procedure: RE EXPLORE EXPLORATORY LAPAROTOMY, WASHOUT AND CLOSURE;  Surgeon: Greer Pickerel, MD;  Location: [place];  Service: General;  Laterality: N/A;   LEFT HEART CATH AND CORONARY ANGIOGRAPHY N/A 05/06/2020   Procedure: LEFT HEART CATH AND CORONARY ANGIOGRAPHY;  Surgeon: Nigel Mormon, MD;  Location: [place] CV LAB;  Service: Cardiovascular;  Laterality: N/A;   PERIPHERAL VASCULAR BALLOON ANGIOPLASTY  11/22/2017   Procedure: PERIPHERAL VASCULAR BALLOON ANGIOPLASTY;  Surgeon: Serafina Mitchell, MD;  Location: [place] CV [facility];  Service: Cardiovascular;;  rt. arm fistula   PERIPHERAL VASCULAR BALLOON ANGIOPLASTY Right 09/27/2020   Procedure: PERIPHERAL VASCULAR BALLOON ANGIOPLASTY;  Surgeon: Serafina Mitchell, MD;  Location: [place] [facility];  Service: Cardiovascular;  Laterality: Right;  arm fistula   PERIPHERAL VASCULAR CATHETERIZATION N/A 06/22/2016   Procedure: Left Arm Venography;  Surgeon: Elam Dutch, MD;  Location: [place] CV LAB;  Service: Cardiovascular;  Laterality: N/A;   PERIPHERAL VASCULAR CATHETERIZATION N/A 06/22/2016   Procedure: A/V Fistulagram - Right Arm;  Surgeon: Elam Dutch, MD;  Location: [place] CV LAB;   Service: Cardiovascular;  Laterality: N/A;   PERIPHERAL VASCULAR CATHETERIZATION Right 06/22/2016   Procedure: Peripheral Vascular Balloon Angioplasty;  Surgeon: Elam Dutch, MD;  Location: [place] CV LAB;  Service: Cardiovascular;  Laterality: Right;  arm fistula   POLYPECTOMY  08/22/2020   Procedure: POLYPECTOMY;  Surgeon: Jackquline Denmark, MD;  Location: [facility] ENDOSCOPY;  Service: Endoscopy;;   REVISON OF ARTERIOVENOUS FISTULA Right 03/15/2020   Procedure: RIGHT ARM ARTERIOVENOUS FISTULA REVISON;  Surgeon: [name], MD;  Location: [place];  Service: Vascular;  Laterality: Right;   SPLENECTOMY, TOTAL N/A 08/23/2020   Procedure: SPLENECTOMY;  Surgeon: Greer Pickerel, MD;  Location: [place];  Service: General;  Laterality: N/A;       Family History  Problem Relation Age of Onset   Cancer Mother  Diabetes Mother    Hypertension Mother    Deep vein thrombosis Daughter     Social History   Tobacco Use   Smoking status: Former    Packs/day: 1.00    Years: 5.00    Pack years: 5.00    Types: Cigarettes    Quit date: 05/28/1993    Years since quitting: 27.6   Smokeless tobacco: Never  Vaping Use   Vaping Use: Never used  Substance Use Topics   Alcohol use: Not Currently    Comment:  "nothing since 1995"   Drug use: Not Currently    Types: Marijuana, Cocaine    Comment: denies 03/02/19    Home Medications Prior to Admission medications   Medication Sig Start Date End Date Taking? Authorizing Provider  aspirin EC 81 MG tablet Take 81 mg by mouth at bedtime.     [provider]  atorvastatin (LIPITOR) 40 MG tablet Take 40 mg by mouth daily. 01/20/20   [provider]  AURYXIA 1 GM 210 MG(Fe) tablet Take 420 mg by mouth 3 (three) times daily. 02/12/20   [provider]  calcitRIOL (ROCALTROL) 0.25 MCG capsule Take 0.25 mcg by mouth daily.    [provider]  cinacalcet (SENSIPAR) 30 MG tablet Take 30 mg by mouth daily.    [provider]   hydrALAZINE (APRESOLINE) 25 MG tablet Take 1 tablet (25 mg total) by mouth 3 (three) times daily. 05/07/20   Dana Allan I, MD  isosorbide mononitrate (IMDUR) 30 MG 24 hr tablet Take 30 mg by mouth daily.  08/15/18   [provider]  labetalol (NORMODYNE) 100 MG tablet Take 100 mg by mouth 3 (three) times daily. Except on Mon Wed Fri take 2 tablets daily 08/03/20   [provider]  lidocaine-prilocaine (EMLA) cream Apply 1 application topically See admin instructions. Apply small amount to dialysis port (AVF) one hour before dialysis. Cover with occlusive dressing (saran wrap) 11/14/17   [provider]  Multiple Vitamins-Minerals (MULTIVITAMIN WITH MINERALS) tablet Take 1 tablet by mouth daily.    [provider]  oxycodone (ROXICODONE) 30 MG immediate release tablet Take 30 mg by mouth in the morning, at noon, and at bedtime.    [provider]  Oxycodone HCl 10 MG TABS Take 10 mg by mouth 2 (two) times daily as needed for moderate pain. 04/29/20   [provider]  traZODone (DESYREL) 150 MG tablet Take 150 mg by mouth at bedtime as needed for sleep. 08/17/20   [provider]  Vitamin D, Ergocalciferol, (DRISDOL) 1.25 MG (50000 UNIT) CAPS capsule Take 50,000 Units by mouth once a week. 08/03/20   [provider]    Allergies    Heparin, Pork-derived products, and Sulfa antibiotics  Review of Systems   Review of Systems  All other systems reviewed and are negative.  Physical Exam Updated Vital Signs BP (!) 150/85   Pulse 100   Temp 98.3 F (36.8 C) (Oral)   Resp 19   SpO2 95%   Physical Exam Vitals and nursing note reviewed.  Constitutional:      General: He is not in acute distress.    Appearance: He is well-developed. He is not ill-appearing.  HENT:     Head: Normocephalic and atraumatic.     Right Ear: External ear normal.     Left Ear: External ear normal.  Eyes:     Conjunctiva/sclera: Conjunctivae  normal.     Pupils: Pupils are equal,  round, and reactive to light.  Neck:     Trachea: Phonation normal.  Cardiovascular:     Rate and Rhythm: Normal rate and regular rhythm.     Heart sounds: Normal heart sounds.  Pulmonary:     Effort: Pulmonary effort is normal.     Breath sounds: Normal breath sounds.  Abdominal:     Palpations: Abdomen is soft.     Tenderness: There is no abdominal tenderness.  Musculoskeletal:        General: Normal range of motion.     Cervical back: Normal range of motion and neck supple.  Skin:    General: Skin is warm and dry.  Neurological:     Mental Status: He is alert and oriented to person, place, and time.     Cranial Nerves: No cranial nerve deficit.     Sensory: No sensory deficit.     Motor: No abnormal muscle tone.     Coordination: Coordination normal.  Psychiatric:        Behavior: Behavior normal.        Thought Content: Thought content normal.        Judgment: Judgment normal.    ED Results / Procedures / Treatments   Labs (all labs ordered are listed, but only abnormal results are displayed) Labs Reviewed  CBC WITH DIFFERENTIAL/PLATELET - Abnormal; Notable for the following components:      Result Value   RBC 3.12 (*)    Hemoglobin 10.0 (*)    HCT 32.1 (*)    MCV 102.9 (*)    RDW 19.9 (*)    nRBC 2.3 (*)    Eosinophils Absolute 0.7 (*)    All other components within normal limits  COMPREHENSIVE METABOLIC PANEL - Abnormal; Notable for the following components:   Chloride 97 (*)    Glucose, Bld 104 (*)    BUN 56 (*)    Creatinine, Ser 9.53 (*)    Albumin 3.2 (*)    GFR, Estimated 6 (*)    All other components within normal limits  TROPONIN I (HIGH SENSITIVITY) - Abnormal; Notable for the following components:   Troponin I (High Sensitivity) 58 (*)    All other components within normal limits  TROPONIN I (HIGH SENSITIVITY)    EKG EKG Interpretation  Date/Time:  Wednesday January 18 2021 10:24:46 EDT Ventricular Rate:   103 PR Interval:  174 QRS Duration: 142 QT Interval:  404 QTC Calculation: 529 R Axis:   174 Text Interpretation: Sinus tachycardia Right bundle branch block Septal infarct , age undetermined Abnormal ECG Since last tracing rate faster Confirmed by Daleen Bo [phone]) on 01/18/2021 3:06:38 PM  Radiology DG Chest 2 View  Result Date: 01/18/2021 CLINICAL DATA:  Chest pain and shortness of breath.  Former smoker. EXAM: CHEST - 2 VIEW COMPARISON:  Chest radiograph 01/09/2021; CT chest 08/23/2020 FINDINGS: New bibasilar reticular opacities. No lobar consolidation. Trace pleural fluid bilaterally. No pneumothorax. Stable cardiomegaly. Scattered thoracic aortic calcifications. Right innominate vein vascular stent is unchanged. Surgical clips overlie the left lateral upper hemithorax. IMPRESSION: 1. Mild pulmonary edema with trace bilateral pleural effusions. 2. Stable cardiomegaly.  Aortic Atherosclerosis (ICD10-I70.0). Electronically Signed   By: Ileana Roup M.D.   On: 01/18/2021 11:17    Procedures Procedures   Medications Ordered in ED Medications - No data to display  ED Course  I have reviewed the triage vital signs and the nursing notes.  Pertinent labs & imaging results that were available during my care  of the patient were reviewed by me and considered in my medical decision making (see chart for details).    MDM Rules/Calculators/A&P                            Patient Vitals for the past 24 hrs:  BP Temp Temp src Pulse Resp SpO2  01/18/21 1600 (!) 150/85 -- -- -- 19 95 %  01/18/21 1545 (!) 153/77 -- -- -- 19 --  01/18/21 1530 (!) 149/71 -- -- -- 17 --  01/18/21 1515 (!) 153/81 -- -- -- (!) 26 --  01/18/21 1500 (!) 153/78 -- -- 100 (!) 22 94 %  01/18/21 1445 (!) 157/83 -- -- -- 20 98 %  01/18/21 1430 (!) 150/75 -- -- -- 20 --  01/18/21 1415 (!) 141/78 -- -- -- (!) 25 --  01/18/21 1400 (!) 144/80 -- -- -- (!) 23 --  01/18/21 1350 (!) 141/81 -- -- 100 19 98 %  01/18/21  1345 (!) 141/81 -- -- -- (!) 21 --  01/18/21 1220 (!) 173/80 -- -- (!) 102 18 94 %  01/18/21 1013 (!) 135/96 98.3 F (36.8 C) Oral (!) 103 15 96 %    4:32 PM Reevaluation with update and discussion. After initial assessment and treatment, an updated evaluation reveals comfortable. Sitting in [place], typing on phone. Discussed potential d/c to f/u tomorrow. He is agreeable. Daleen Bo   Medical Decision Making:  This patient is presenting for evaluation of chest pain, which does require a range of treatment options, and is a complaint that involves a moderate risk of morbidity and mortality. The differential diagnoses include ACS, pulmonary edema, complications from dialysis. I decided to review old records, and in summary elderly male, coming from home for evaluation of chest pain.  He did not dialyze today because of the chest pain.  Recent complicated history including splenectomy, ongoing hemodialysis, history of heart failure and mitral valvular disease.  He does not see cardiology currently I obtained additional historical information from his POA who is his granddaughter.  Clinical Laboratory Tests Ordered, included CBC, Metabolic panel, and delta troponin . Review indicates normal except chloride low, glucose high, BUN high, creatinine high, albumin low, GFR low, troponin high, hemoglobin low, MCV high. Radiologic Tests Ordered, included chest x-ray.  I independently Visualized: Radiographic images, which show mild pulmonary edema and effusions   Critical Interventions-clinical evaluation, laboratory testing, chest x-ray, observation and reassess  After These Interventions, the Patient was reevaluated and was found stable without worsening discomfort.  Evaluation today is reassuring.  Lungs with mild fluid overload, not requiring emergent dialysis.  No respiratory distress.  Initial troponin elevated however much lower than 9 days ago when he was in respiratory distress with fluid  overload.  Doubt ACS, pneumonia, metabolic disorder.  He is stable for discharge with ongoing management, as an outpatient.  CRITICAL CARE-no Performed by: Daleen Bo  Nursing Notes Reviewed/ Care Coordinated Applicable Imaging Reviewed Interpretation of Laboratory Data incorporated into ED treatment  The patient appears reasonably screened and/or stabilized for discharge and I doubt any other medical condition or other [facility] requiring further screening, evaluation, or treatment in the ED at this time prior to discharge.  Plan: Home Medications-continue usual; Home Treatments-gradually advance diet and activity; return here if the recommended treatment, does not improve the symptoms; Recommended follow up-PCP, as needed.  Regular dialysis treatments.     Final Clinical Impression(s) / ED Diagnoses Final  diagnoses:  Nonspecific chest pain  End stage renal disease [facility])    Rx / DC Orders ED Discharge Orders     None        Daleen Bo, MD 01/19/21 1506

## 2021-01-18 NOTE — ED Notes (Signed)
Called pt 3x no answer will check later

## 2021-01-18 NOTE — ED Notes (Signed)
Pt verbalizes understanding of discharge instructions. Opportunity for questions and answers were provided. Pt discharged from the ED.   ?

## 2021-01-18 NOTE — Discharge Instructions (Addendum)
It was our pleasure to provide your ER care today - we hope that you feel better.  There were no serious problems found with your heart or lungs today.  You do not need emergent dialysis today.  We discussed your case with your kidney doctors - they indicate for you to contact your dialysis center first thing in AM tomorrow, and arrange hemodialysis time for tomorrow then.   For recent chest discomfort, follow up with cardiologist in the coming week - call office tomorrow to arrange appointment.   Return to ER if worse, new symptoms, fevers, recurrent or persistent chest pain, trouble breathing, or other concern.

## 2021-01-18 NOTE — ED Provider Notes (Signed)
Patients signed out by Dr Eulis Foster that trop chr elevated, and that if delta trop similar to initial, to d/c to home.  Nephrology called to say they tried to reach HD center but were unable, they indicate to d/c patient and have patient call his HD center first thing in AM to arrange HD then - discussed w pt, pt voiced understanding.   Pt has eaten/drank, is ambulatory about ED with no sob, no chest pain, and pulse ox 99%. Pt is breathing comfortably.   Delta trop similar to initial.   Pt currently appears stable for d/c per Dr Beatriz Chancellor plan.      Lajean Saver, MD 01/18/21 [phone]

## 2021-01-25 ENCOUNTER — Encounter (HOSPITAL_COMMUNITY): Payer: Self-pay | Admitting: Internal Medicine

## 2021-01-25 ENCOUNTER — Emergency Department (HOSPITAL_COMMUNITY): Payer: Medicare HMO

## 2021-01-25 ENCOUNTER — Observation Stay (HOSPITAL_COMMUNITY)
Admission: EM | Admit: 2021-01-25 | Discharge: 2021-01-26 | Disposition: A | Discharge status: Home / Self Care | Payer: Medicare HMO | Attending: Internal Medicine | Admitting: Internal Medicine

## 2021-01-25 DIAGNOSIS — E7800 Pure hypercholesterolemia, unspecified: Secondary | ICD-10-CM

## 2021-01-25 DIAGNOSIS — E8770 Fluid overload, unspecified: Secondary | ICD-10-CM

## 2021-01-25 DIAGNOSIS — R9431 Abnormal electrocardiogram [ECG] [EKG]: Secondary | ICD-10-CM

## 2021-01-25 DIAGNOSIS — F1910 Other psychoactive substance abuse, uncomplicated: Secondary | ICD-10-CM | POA: Diagnosis not present

## 2021-01-25 DIAGNOSIS — I509 Heart failure, unspecified: Principal | ICD-10-CM | POA: Insufficient documentation

## 2021-01-25 DIAGNOSIS — R0602 Shortness of breath: Secondary | ICD-10-CM | POA: Diagnosis present

## 2021-01-25 DIAGNOSIS — Z8551 Personal history of malignant neoplasm of bladder: Secondary | ICD-10-CM | POA: Diagnosis not present

## 2021-01-25 DIAGNOSIS — Z7982 Long term (current) use of aspirin: Secondary | ICD-10-CM | POA: Diagnosis not present

## 2021-01-25 DIAGNOSIS — M545 Low back pain, unspecified: Secondary | ICD-10-CM | POA: Diagnosis present

## 2021-01-25 DIAGNOSIS — I119 Hypertensive heart disease without heart failure: Secondary | ICD-10-CM | POA: Diagnosis not present

## 2021-01-25 DIAGNOSIS — Z87891 Personal history of nicotine dependence: Secondary | ICD-10-CM | POA: Insufficient documentation

## 2021-01-25 DIAGNOSIS — I2510 Atherosclerotic heart disease of native coronary artery without angina pectoris: Secondary | ICD-10-CM | POA: Insufficient documentation

## 2021-01-25 DIAGNOSIS — G8929 Other chronic pain: Secondary | ICD-10-CM | POA: Diagnosis present

## 2021-01-25 DIAGNOSIS — Z992 Dependence on renal dialysis: Secondary | ICD-10-CM | POA: Insufficient documentation

## 2021-01-25 DIAGNOSIS — I132 Hypertensive heart and chronic kidney disease with heart failure and with stage 5 chronic kidney disease, or end stage renal disease: Secondary | ICD-10-CM | POA: Insufficient documentation

## 2021-01-25 DIAGNOSIS — J810 Acute pulmonary edema: Secondary | ICD-10-CM | POA: Diagnosis not present

## 2021-01-25 DIAGNOSIS — Z20822 Contact with and (suspected) exposure to covid-19: Secondary | ICD-10-CM | POA: Insufficient documentation

## 2021-01-25 DIAGNOSIS — Z79899 Other long term (current) drug therapy: Secondary | ICD-10-CM | POA: Insufficient documentation

## 2021-01-25 DIAGNOSIS — N186 End stage renal disease: Secondary | ICD-10-CM | POA: Insufficient documentation

## 2021-01-25 DIAGNOSIS — I4581 Long QT syndrome: Secondary | ICD-10-CM | POA: Diagnosis not present

## 2021-01-25 LAB — BASIC METABOLIC PANEL WITH GFR
Anion gap: 17 — ABNORMAL HIGH (ref 5–15)
BUN: 50 mg/dL — ABNORMAL HIGH (ref 8–23)
CO2: 22 mmol/L (ref 22–32)
Calcium: 8.5 mg/dL — ABNORMAL LOW (ref 8.9–10.3)
Chloride: 99 mmol/L (ref 98–111)
Creatinine, Ser: 9.01 mg/dL — ABNORMAL HIGH (ref 0.61–1.24)
GFR, Estimated: 6 mL/min — ABNORMAL LOW
Glucose, Bld: 86 mg/dL (ref 70–99)
Potassium: 4.6 mmol/L (ref 3.5–5.1)
Sodium: 138 mmol/L (ref 135–145)

## 2021-01-25 LAB — CBC
HCT: 34.4 % — ABNORMAL LOW (ref 39.0–52.0)
Hemoglobin: 10.4 g/dL — ABNORMAL LOW (ref 13.0–17.0)
MCH: 32 pg (ref 26.0–34.0)
MCHC: 30.2 g/dL (ref 30.0–36.0)
MCV: 105.8 fL — ABNORMAL HIGH (ref 80.0–100.0)
Platelets: 167 K/uL (ref 150–400)
RBC: 3.25 MIL/uL — ABNORMAL LOW (ref 4.22–5.81)
RDW: 19.4 % — ABNORMAL HIGH (ref 11.5–15.5)
WBC: 6.1 K/uL (ref 4.0–10.5)
nRBC: 0 % (ref 0.0–0.2)

## 2021-01-25 LAB — HEPATITIS B CORE ANTIBODY, TOTAL: Hep B Core Total Ab: NONREACTIVE

## 2021-01-25 LAB — RESP PANEL BY RT-PCR (FLU A&B, COVID) ARPGX2
Influenza A by PCR: NEGATIVE
Influenza B by PCR: NEGATIVE
SARS Coronavirus 2 by RT PCR: NEGATIVE

## 2021-01-25 LAB — HEPATITIS B SURFACE ANTIGEN: Hepatitis B Surface Ag: NONREACTIVE

## 2021-01-25 LAB — TROPONIN I (HIGH SENSITIVITY)
Troponin I (High Sensitivity): 90 ng/L — ABNORMAL HIGH
Troponin I (High Sensitivity): 99 ng/L — ABNORMAL HIGH

## 2021-01-25 MED ORDER — ISOSORBIDE MONONITRATE ER 30 MG PO TB24
30.0000 mg | ORAL_TABLET | Freq: Every day | ORAL | Status: DC
  Administered 2021-01-25 – 2021-01-26 (×2): 30 mg via ORAL
  Filled 2021-01-25 (×2): qty 1

## 2021-01-25 MED ORDER — HYDRALAZINE HCL 25 MG PO TABS
25.0000 mg | ORAL_TABLET | Freq: Three times a day (TID) | ORAL | Status: DC
  Administered 2021-01-25 – 2021-01-26 (×3): 25 mg via ORAL
  Filled 2021-01-25 (×3): qty 1

## 2021-01-25 MED ORDER — DOXERCALCIFEROL 4 MCG/2ML IV SOLN
5.0000 ug | INTRAVENOUS | Status: DC
  Administered 2021-01-25: 5 ug via INTRAVENOUS
  Filled 2021-01-25 (×3): qty 4

## 2021-01-25 MED ORDER — TRAZODONE HCL 50 MG PO TABS: 150.0000 mg | ORAL_TABLET | Freq: Every evening | ORAL | Status: DC | PRN

## 2021-01-25 MED ORDER — PENTAFLUOROPROP-TETRAFLUOROETH EX AERO: 1.0000 "application " | INHALATION_SPRAY | CUTANEOUS | Status: DC | PRN

## 2021-01-25 MED ORDER — NEPRO/CARBSTEADY PO LIQD
237.0000 mL | Freq: Three times a day (TID) | ORAL | Status: DC | PRN
  Filled 2021-01-25: qty 237

## 2021-01-25 MED ORDER — NITROGLYCERIN PEDIATRIC IV INFUSION 200 MCG/ML: 5.0000 ug/kg/min | INTRAVENOUS | Status: DC

## 2021-01-25 MED ORDER — CAMPHOR-MENTHOL 0.5-0.5 % EX LOTN
1.0000 "application " | TOPICAL_LOTION | Freq: Three times a day (TID) | CUTANEOUS | Status: DC | PRN
  Filled 2021-01-25: qty 222

## 2021-01-25 MED ORDER — CINACALCET HCL 30 MG PO TABS
30.0000 mg | ORAL_TABLET | Freq: Every day | ORAL | Status: DC
  Administered 2021-01-26: 30 mg via ORAL
  Filled 2021-01-25 (×2): qty 1

## 2021-01-25 MED ORDER — LIDOCAINE HCL (PF) 1 % IJ SOLN: 5.0000 mL | INTRAMUSCULAR | Status: DC | PRN

## 2021-01-25 MED ORDER — CALCITRIOL 0.25 MCG PO CAPS: 0.2500 ug | ORAL_CAPSULE | Freq: Every day | ORAL | Status: DC

## 2021-01-25 MED ORDER — ACETAMINOPHEN 325 MG PO TABS: 650.0000 mg | ORAL_TABLET | Freq: Four times a day (QID) | ORAL | Status: DC | PRN

## 2021-01-25 MED ORDER — MELATONIN 5 MG PO TABS: 10.0000 mg | ORAL_TABLET | Freq: Every evening | ORAL | Status: DC | PRN

## 2021-01-25 MED ORDER — HYDROXYZINE HCL 25 MG PO TABS: 25.0000 mg | ORAL_TABLET | Freq: Three times a day (TID) | ORAL | Status: DC | PRN

## 2021-01-25 MED ORDER — SODIUM ZIRCONIUM CYCLOSILICATE 10 G PO PACK
10.0000 g | PACK | Freq: Every day | ORAL | Status: DC
  Administered 2021-01-26: 10 g via ORAL
  Filled 2021-01-25 (×2): qty 1

## 2021-01-25 MED ORDER — NITROGLYCERIN IN D5W 200-5 MCG/ML-% IV SOLN
0.0000 ug/min | INTRAVENOUS | Status: DC
  Filled 2021-01-25: qty 250

## 2021-01-25 MED ORDER — DOCUSATE SODIUM 283 MG RE ENEM
1.0000 | ENEMA | RECTAL | Status: DC | PRN
  Filled 2021-01-25: qty 1

## 2021-01-25 MED ORDER — SODIUM CHLORIDE 0.9 % IV SOLN: 100.0000 mL | INTRAVENOUS | Status: DC | PRN

## 2021-01-25 MED ORDER — CHLORHEXIDINE GLUCONATE CLOTH 2 % EX PADS: 6.0000 | MEDICATED_PAD | Freq: Every day | CUTANEOUS | Status: DC

## 2021-01-25 MED ORDER — LABETALOL HCL 100 MG PO TABS
100.0000 mg | ORAL_TABLET | Freq: Three times a day (TID) | ORAL | Status: DC
  Administered 2021-01-25 – 2021-01-26 (×3): 100 mg via ORAL
  Filled 2021-01-25 (×3): qty 1

## 2021-01-25 MED ORDER — ASPIRIN 81 MG PO CHEW
324.0000 mg | CHEWABLE_TABLET | Freq: Once | ORAL | Status: DC
  Filled 2021-01-25: qty 4

## 2021-01-25 MED ORDER — ATORVASTATIN CALCIUM 40 MG PO TABS
40.0000 mg | ORAL_TABLET | Freq: Every day | ORAL | Status: DC
  Administered 2021-01-25 – 2021-01-26 (×2): 40 mg via ORAL
  Filled 2021-01-25 (×2): qty 1

## 2021-01-25 MED ORDER — LIDOCAINE-PRILOCAINE 2.5-2.5 % EX CREA: 1.0000 "application " | TOPICAL_CREAM | CUTANEOUS | Status: DC

## 2021-01-25 MED ORDER — SORBITOL 70 % SOLN
30.0000 mL | Status: DC | PRN
  Filled 2021-01-25: qty 30

## 2021-01-25 MED ORDER — OXYCODONE HCL 5 MG PO TABS
30.0000 mg | ORAL_TABLET | Freq: Three times a day (TID) | ORAL | Status: DC
  Administered 2021-01-25 – 2021-01-26 (×3): 30 mg via ORAL
  Filled 2021-01-25 (×3): qty 6

## 2021-01-25 MED ORDER — OXYCODONE HCL 5 MG PO TABS: 10.0000 mg | ORAL_TABLET | Freq: Two times a day (BID) | ORAL | Status: DC | PRN

## 2021-01-25 MED ORDER — ASPIRIN EC 81 MG PO TBEC: 81.0000 mg | DELAYED_RELEASE_TABLET | Freq: Every day | ORAL | Status: DC

## 2021-01-25 MED ORDER — LIDOCAINE-PRILOCAINE 2.5-2.5 % EX CREA: 1.0000 "application " | TOPICAL_CREAM | CUTANEOUS | Status: DC | PRN

## 2021-01-25 MED ORDER — CALCIUM CARBONATE ANTACID 1250 MG/5ML PO SUSP
500.0000 mg | Freq: Four times a day (QID) | ORAL | Status: DC | PRN
  Filled 2021-01-25: qty 5

## 2021-01-25 MED ORDER — ACETAMINOPHEN 650 MG RE SUPP: 650.0000 mg | Freq: Four times a day (QID) | RECTAL | Status: DC | PRN

## 2021-01-25 MED ORDER — CALCITRIOL 0.25 MCG PO CAPS
0.2500 ug | ORAL_CAPSULE | Freq: Every day | ORAL | Status: DC
  Administered 2021-01-25 – 2021-01-26 (×2): 0.25 ug via ORAL
  Filled 2021-01-25 (×2): qty 1

## 2021-01-25 MED ORDER — FERRIC CITRATE 1 GM 210 MG(FE) PO TABS
420.0000 mg | ORAL_TABLET | Freq: Three times a day (TID) | ORAL | Status: DC
  Administered 2021-01-25 – 2021-01-26 (×2): 420 mg via ORAL
  Filled 2021-01-25 (×5): qty 2

## 2021-01-25 NOTE — Progress Notes (Signed)
WILBUR OAKLAND is a 65 Y/O male with ESRD on HD MWF who missed HD this AM and presented to ED with chest pain. CXR with CHF pattern. New heart murmur. He has been admitted as observation patient. We will manage HD today and will consult formally if patient status upgraded to in-patient.   Last HD 01/23/2021, signed off 45 minutes early last treatment, left 0.8 kg above EDW. Recent admission 08/15-08/16/2022 for same issue.   HD orders: [facility] MWF 3:15 hrs 160NRe 400/600 46 kg 2.0K/2.0 Ca AVF -No heparin -Mircera 225 mcg IV q 2 weeks (last dose 01/23/2021)   Juanell Fairly [facility] [phone]

## 2021-01-25 NOTE — ED Triage Notes (Signed)
Pt via [facility], c/o CP/SHOB, scheduled for dialysis this AM. Hx PE, pt states feels like PE or need for dialysis Lungs clear, VSS 324 ASA, 0.4NTG

## 2021-01-25 NOTE — ED Notes (Addendum)
The patient has a restricted right arm due to hemodialysis access. This EMT placed the appropriate armband to notify staff

## 2021-01-25 NOTE — ED Provider Notes (Signed)
[facility] EMERGENCY DEPARTMENT Provider Note   CSN: [id number] Arrival date & time: 01/25/21  9476     History Chief Complaint  Patient presents with   Chest Pain   Shortness of Breath    Logan Martin is a 65 y.o. male.   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  He is scheduled for dialysis on Monday Wednesday and Friday and reports that he has not missed any sessions.  The patient reports to me that he has had increasing chest pain and shortness of breath that started last night, this has been constant, he called the paramedics around 2:00 in the morning, he was told that his vital signs look good and they helped him to slow his breathing down at which time he felt better and declined transport.  Later on this morning about 1 hour ago the patient started to have increasing shortness of breath and thus he came back by paramedic transport.  He denies coughing fevers or swelling of the legs but does endorse a feeling of significant chest discomfort as well as shortness of breath.  He was scheduled for dialysis this morning but due to the severity of his symptoms decided to come to the emergency department instead.  No coughing, no rash, no upper respiratory symptoms, no prior history of cardiac disease that he knows of.  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 Started HD in March [facility], [place].  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  On NxStage home hemo since 2010. Using RUA AVF.     Hearing aid worn    B/L   Hepatitis C    s/p treatment with Mavyret 03/2017.  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) 54/65/0354   Abdominal pain    Acute respiratory failure with hypoxia (HCC)    Tachycardia 08/20/2020   Oral infection 08/20/2020   Other specified diseases of liver 06/10/2020   Non-ST elevation (NSTEMI) myocardial infarction ([place])  ESRD (end stage renal disease) on dialysis ([place]) 05/05/2020   Prolonged QT interval 05/05/2020   Allergy, unspecified, sequela 03/18/2020   Pain, unspecified 03/18/2020   Personal history of anaphylaxis 03/18/2020   Cellulitis 03/10/2020   Chronic low back pain    Peripheral vascular disease ([place])    Other mechanical complication of surgically created arteriovenous fistula, initial encounter ([place]) 03/09/2020   Unspecified open wound of right upper arm, subsequent encounter 03/09/2020   Hypercalcemia 01/20/2020   Other disorders of phosphorus metabolism 01/01/2020   Severe mitral regurgitation 12/22/2018    Hypercholesterolemia 12/22/2018   History of bladder cancer 08/14/2018   Personal history of pulmonary embolism 08/14/2018   Red blood cell antibody positive, compatible PRBC difficult to obtain 08/14/2018   Malignant neoplasm of overlapping sites of bladder ([place]) 07/20/2018   Volume overload 06/18/2018   Fluid overload 06/17/2018   Elevated troponin 06/17/2018   Hematuria 03/27/2018   Hypokalemia 03/27/2018   Thrombocytopenia ([place]) 03/27/2018   Anemia in ESRD (end-stage renal disease) ([place]) 03/27/2018   Bladder tumor 03/27/2018   Encounter for adequacy testing for peritoneal dialysis ([place]) 01/03/2018   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   Compression of vein 11/12/2017   Hypertension with heart disease 10/14/2017   Pyelocystitis 10/13/2017   Chronic mastoiditis of both sides 06/19/2017   Cramp and spasm 02/20/2017   Acidosis 02/18/2017   Dependence on renal dialysis ([place]) 02/18/2017   Hypertensive heart and chronic kidney disease with heart failure and with stage 5 chronic kidney disease, or end stage renal disease ([place]) 02/18/2017   Iron deficiency anemia, unspecified 02/18/2017   Moderate protein-calorie malnutrition ([place]) 02/18/2017   Other dietary vitamin B12 deficiency anemia 02/18/2017   Other disorders of electrolyte and fluid balance, not elsewhere classified 02/18/2017   Other disorders resulting from impaired renal tubular function 02/18/2017   Personal history of nicotine dependence 02/18/2017   Cardiomegaly 01/21/2017   Congestive heart failure ([place]) 01/21/2017   Pancytopenia ([place]) 06/14/2016   Left arm pain 06/14/2016   SVC (superior vena cava obstruction) 06/14/2016   Mechanical complication of other vascular device, implant, and graft 08/10/2013   End stage renal disease ([place]) 08/10/2013   ESRD (end stage renal disease) ([place]) 06/07/2013   Nausea and vomiting 06/07/2013   SOB (shortness of breath) 05/10/2013   Chest discomfort 11/27/2012   Shortness of breath 11/27/2012    Chronic hepatitis C without hepatic coma ([place]) 02/04/2012   Secondary hyperparathyroidism (of renal origin) 02/04/2012   Anemia associated with chronic renal failure 02/04/2012   CAD (coronary atherosclerotic disease) 02/04/2012   Pulmonary embolism ([place]) 07/20/2010   Chronic pain syndrome 06/08/2010   Renal failure 04/26/2010   Atherosclerosis of native coronary artery of native heart without angina pectoris 02/23/2009   HEMORRHOIDS, INTERNAL 02/23/2009   GERD 02/23/2009   ESRD on dialysis ([place]) 02/16/2009   Allergic rhinitis 05/06/2008    Past Surgical History:  Procedure Laterality Date   A/V FISTULAGRAM N/A 11/22/2017   Procedure: A/V Fistulagram;  Surgeon: Serafina Mitchell, MD;  Location: [place] CV [facility];  Service: Cardiovascular;  Laterality: N/A;  rt. arm    ABDOMINAL HERNIA REPAIR  2952    x 2   APPLICATION OF WOUND VAC N/A 08/23/2020   Procedure: APPLICATION OF ABDOMINAL WOUND VAC;  Surgeon: Greer Pickerel, MD;  Location: [place];  Service: General;  Laterality: N/A;   AV FISTULA PLACEMENT Right 2007   upper arm   AV FISTULA REPAIR Right ~ 03/2016 & 04/2016  BLADDER AUGMENTATION  1975   COLONOSCOPY     COLONOSCOPY WITH PROPOFOL N/A 08/22/2020   Procedure: COLONOSCOPY WITH PROPOFOL;  Surgeon: Jackquline Denmark, MD;  Location: [facility] ENDOSCOPY;  Service: Endoscopy;  Laterality: N/A;   CORONARY ANGIOPLASTY WITH STENT PLACEMENT  1999   CYSTOSCOPY W/ URETERAL STENT PLACEMENT N/A 10/13/2017   Procedure: CYSTOSCOPY WITH IRRIGATION OF BLADDER PLACEMENT OF FOLEY;  Surgeon: Ceasar Mons, MD;  Location: [place];  Service: Urology;  Laterality: N/A;   CYSTOSCOPY WITH FULGERATION N/A 03/27/2018   Procedure: CYSTOSCOPY WITH FULGERATION AND MUCOUS EVACUATION, BLADDER BIOPSY;  Surgeon: Franchot Gallo, MD;  Location: [facility];  Service: Urology;  Laterality: N/A;   ESOPHAGOGASTRODUODENOSCOPY     ESOPHAGOGASTRODUODENOSCOPY N/A 08/24/2020   Procedure: ESOPHAGOGASTRODUODENOSCOPY (EGD);   Surgeon: Jackquline Denmark, MD;  Location: [facility] ENDOSCOPY;  Service: Endoscopy;  Laterality: N/A;   EXTRACORPOREAL SHOCK WAVE LITHOTRIPSY     FISTULA SUPERFICIALIZATION Right 09/02/5460   Procedure: PLICATION OF ANEURYSM OF ARTERIOVENOUS FISTULA;  Surgeon: Angelia Mould, MD;  Location: [place];  Service: Vascular;  Laterality: Right;   HERNIA REPAIR     INGUINAL HERNIA REPAIR Right [place] Left 03/04/2019   Procedure: LEFT OPEN INGUINAL HERNIA REPAIR WITH MESH;  Surgeon: Kinsinger, Arta Bruce, MD;  Location: [place];  Service: General;  Laterality: Left;  GENERAL AND LMA   INSERTION OF DIALYSIS CATHETER Left 03/15/2020   Procedure: INSERTION OF Left Internal Jugular DIALYSIS CATHETER;  Surgeon: [name], MD;  Location: [place];  Service: Vascular;  Laterality: Left;   IR FLUORO GUIDE CV LINE LEFT  03/11/2020   IR FLUORO GUIDE CV LINE LEFT  08/20/2020   IR US GUIDE VASC ACCESS LEFT  03/11/2020   IR US GUIDE VASC ACCESS LEFT  08/20/2020   IR US GUIDE VASC ACCESS RIGHT  08/20/2020   IR VENO/JUGULAR RIGHT  08/20/2020   LAPAROTOMY N/A 08/23/2020   Procedure: EXPLORATORY LAPAROTOMY;  Surgeon: Greer Pickerel, MD;  Location: [place];  Service: General;  Laterality: N/A;   LAPAROTOMY N/A 08/25/2020   Procedure: RE EXPLORE EXPLORATORY LAPAROTOMY, WASHOUT AND CLOSURE;  Surgeon: Greer Pickerel, MD;  Location: [place];  Service: General;  Laterality: N/A;   LEFT HEART CATH AND CORONARY ANGIOGRAPHY N/A 05/06/2020   Procedure: LEFT HEART CATH AND CORONARY ANGIOGRAPHY;  Surgeon: Nigel Mormon, MD;  Location: [place] CV LAB;  Service: Cardiovascular;  Laterality: N/A;   PERIPHERAL VASCULAR BALLOON ANGIOPLASTY  11/22/2017   Procedure: PERIPHERAL VASCULAR BALLOON ANGIOPLASTY;  Surgeon: Serafina Mitchell, MD;  Location: [place] CV LAB;  Service: Cardiovascular;;  rt. arm fistula   PERIPHERAL VASCULAR BALLOON ANGIOPLASTY Right 09/27/2020   Procedure: PERIPHERAL VASCULAR BALLOON ANGIOPLASTY;   Surgeon: Serafina Mitchell, MD;  Location: [place] CV LAB;  Service: Cardiovascular;  Laterality: Right;  arm fistula   PERIPHERAL VASCULAR CATHETERIZATION N/A 06/22/2016   Procedure: Left Arm Venography;  Surgeon: Elam Dutch, MD;  Location: [place] CV LAB;  Service: Cardiovascular;  Laterality: N/A;   PERIPHERAL VASCULAR CATHETERIZATION N/A 06/22/2016   Procedure: A/V Fistulagram - Right Arm;  Surgeon: Elam Dutch, MD;  Location: [place] [facility];  Service: Cardiovascular;  Laterality: N/A;   PERIPHERAL VASCULAR CATHETERIZATION Right 06/22/2016   Procedure: Peripheral Vascular Balloon Angioplasty;  Surgeon: Elam Dutch, MD;  Location: [place] [facility];  Service: Cardiovascular;  Laterality: Right;  arm fistula   POLYPECTOMY  08/22/2020   Procedure: POLYPECTOMY;  Surgeon: Lyndel Safe,  Peyton Bottoms, MD;  Location: [place];  Service: Endoscopy;;   REVISON OF ARTERIOVENOUS FISTULA Right 03/15/2020   Procedure: RIGHT ARM ARTERIOVENOUS FISTULA REVISON;  Surgeon: [name], MD;  Location: [place];  Service: Vascular;  Laterality: Right;   SPLENECTOMY, TOTAL N/A 08/23/2020   Procedure: SPLENECTOMY;  Surgeon: Greer Pickerel, MD;  Location: [place];  Service: General;  Laterality: N/A;       Family History  Problem Relation Age of Onset   Cancer Mother    Diabetes Mother    Hypertension Mother    Deep vein thrombosis Daughter     Social History   Tobacco Use   Smoking status: Former    Packs/day: 1.00    Years: 5.00    Pack years: 5.00    Types: Cigarettes    Quit date: 05/28/1993    Years since quitting: 27.6   Smokeless tobacco: Never  Vaping Use   Vaping Use: Never used  Substance Use Topics   Alcohol use: Not Currently    Comment:  "nothing since 1995"   Drug use: Not Currently    Types: Marijuana, Cocaine    Comment: denies 03/02/19    Home Medications Prior to Admission medications   Medication Sig Start Date End Date Taking? Authorizing Provider  aspirin EC 81  MG tablet Take 81 mg by mouth at bedtime.     [provider]  atorvastatin (LIPITOR) 40 MG tablet Take 40 mg by mouth daily. 01/20/20   [provider]  AURYXIA 1 GM 210 MG(Fe) tablet Take 420 mg by mouth 3 (three) times daily. 02/12/20   [provider]  calcitRIOL (ROCALTROL) 0.25 MCG capsule Take 0.25 mcg by mouth daily.    [provider]  cinacalcet (SENSIPAR) 30 MG tablet Take 30 mg by mouth daily.    [provider]  hydrALAZINE (APRESOLINE) 25 MG tablet Take 1 tablet (25 mg total) by mouth 3 (three) times daily. 05/07/20   Dana Allan I, MD  isosorbide mononitrate (IMDUR) 30 MG 24 hr tablet Take 30 mg by mouth daily.  08/15/18   [provider]  labetalol (NORMODYNE) 100 MG tablet Take 100 mg by mouth 3 (three) times daily. Except on Mon Wed Fri take 2 tablets daily 08/03/20   [provider]  lidocaine-prilocaine (EMLA) cream Apply 1 application topically See admin instructions. Apply small amount to dialysis Martin (AVF) one hour before dialysis. Cover with occlusive dressing (saran wrap) 11/14/17   [provider]  Multiple Vitamins-Minerals (MULTIVITAMIN WITH MINERALS) tablet Take 1 tablet by mouth daily.    [provider]  oxycodone (ROXICODONE) 30 MG immediate release tablet Take 30 mg by mouth in the morning, at noon, and at bedtime.    [provider]  Oxycodone HCl 10 MG TABS Take 10 mg by mouth 2 (two) times daily as needed for moderate pain. 04/29/20   [provider]  traZODone (DESYREL) 150 MG tablet Take 150 mg by mouth at bedtime as needed for sleep. 08/17/20   [provider]  Vitamin D, Ergocalciferol, (DRISDOL) 1.25 MG (50000 UNIT) CAPS capsule Take 50,000 Units by mouth once a week. 08/03/20   [provider]    Allergies    Heparin, Pork-derived products, and Sulfa antibiotics  Review of Systems   Review of Systems  Respiratory:  Positive for shortness of  breath.   Cardiovascular:  Positive for chest pain.  All other systems reviewed and are negative.  Physical Exam Updated Vital  Signs BP (!) 147/89 (BP Location: Left Arm)   Pulse (!) 104   Temp 97.9 F (36.6 C)   Resp 17   SpO2 100%   Physical Exam Vitals and nursing note reviewed.  Constitutional:      General: He is not in acute distress.    Appearance: He is well-developed.  HENT:     Head: Normocephalic and atraumatic.     Mouth/Throat:     Pharynx: No oropharyngeal exudate.  Eyes:     General: No scleral icterus.       Right eye: No discharge.        Left eye: No discharge.     Conjunctiva/sclera: Conjunctivae normal.     Pupils: Pupils are equal, round, and reactive to light.  Neck:     Thyroid: No thyromegaly.     Vascular: No JVD.  Cardiovascular:     Rate and Rhythm: Regular rhythm. Tachycardia present.     Heart sounds: Normal heart sounds. No murmur heard.   No friction rub. No gallop.     Comments: Fistula has good thrill, no overlying redness or tenderness, right upper extremity Pulmonary:     Effort: Tachypnea and respiratory distress present.     Breath sounds: Rales present. No wheezing.  Abdominal:     General: Bowel sounds are normal. There is no distension.     Palpations: Abdomen is soft. There is no mass.     Tenderness: There is no abdominal tenderness.  Musculoskeletal:        General: No tenderness. Normal range of motion.     Cervical back: Normal range of motion and neck supple.     Right lower leg: No edema.     Left lower leg: No edema.  Lymphadenopathy:     Cervical: No cervical adenopathy.  Skin:    General: Skin is warm and dry.     Findings: No erythema or rash.  Neurological:     Mental Status: He is alert.     Coordination: Coordination normal.     Comments: Able to move all 4 extremities, able to sit up in the bed and help take his shirt off, speech is clear, cranial nerves III through XII appear normal  Psychiatric:         Behavior: Behavior normal.    ED Results / Procedures / Treatments   Labs (all labs ordered are listed, but only abnormal results are displayed) Labs Reviewed  CBC - Abnormal; Notable for the following components:      Result Value   RBC 3.25 (*)    Hemoglobin 10.4 (*)    HCT 34.4 (*)    MCV 105.8 (*)    RDW 19.4 (*)    All other components within normal limits  BASIC METABOLIC PANEL  TROPONIN I (HIGH SENSITIVITY)    EKG EKG Interpretation  Date/Time:  Wednesday January 25 2021 06:12:13 EDT Ventricular Rate:  105 PR Interval:  174 QRS Duration: 134 QT Interval:  388 QTC Calculation: 512 R Axis:   148 Text Interpretation: Sinus tachycardia Right bundle branch block Anteroseptal infarct , age undetermined Abnormal ECG since last tracing no significant change Confirmed by Noemi Chapel [id number]) on 01/25/2021 7:02:17 AM  Radiology DG Chest 2 View  Result Date: 01/25/2021 CLINICAL DATA:  Chest pain and shortness of breath EXAM: CHEST - 2 VIEW COMPARISON:  Seven days ago FINDINGS: Chronic cardiomegaly. Interstitial coarsening which is generalized, with small left pleural effusion. Right brachiocephalic stenting. Heavily calcified  fistula in the right arm. Left upper extremity dialysis access stenting as well. IMPRESSION: CHF pattern Electronically Signed   By: Monte Fantasia M.D.   On: 01/25/2021 07:17    Procedures .Critical Care Performed by: Noemi Chapel, MD Authorized by: Noemi Chapel, MD   Critical care provider statement:    Critical care time (minutes):  35   Critical care time was exclusive of:  Separately billable procedures and treating other patients and teaching time   Critical care was necessary to treat or prevent imminent or life-threatening deterioration of the following conditions:  Cardiac failure and renal failure   Critical care was time spent personally by me on the following activities:  Blood draw for specimens, development of treatment plan with patient  or surrogate, discussions with consultants, evaluation of patient's response to treatment, examination of patient, obtaining history from patient or surrogate, ordering and performing treatments and interventions, ordering and review of laboratory studies, ordering and review of radiographic studies, pulse oximetry, re-evaluation of patient's condition and review of old charts   Medications Ordered in ED Medications - No data to display  ED Course  I have reviewed the triage vital signs and the nursing notes.  Pertinent labs & imaging results that were available during my care of the patient were reviewed by me and considered in my medical decision making (see chart for details).    MDM Rules/Calculators/A&P                           The patient is ill-appearing with increasing shortness of breath.  His chest x-ray confirms the clinical exam findings with rales showing that there is pulmonary edema and congestive heart failure.  The patient likely needs dialysis but due to his increasing chest pain may need to have cardiac evaluation as well.  Labs show no leukocytosis, chronic mild anemia, troponin is pending.  Anticipate admission to the hospital with nephrology consultation for dialysis.  Work-up reveals congestive heart failure pattern on the chest x-ray, whether this is related to the patient's need for dialysis or worsening heart failure is unclear.  Remains tachycardic, at this time needs to be admitted to the hospital, acute pulmonary edema, critically ill, consulted with admitting team  Final Clinical Impression(s) / ED Diagnoses Final diagnoses:  None  Acute congestive heart failure and pulmonary edema End-stage renal disease Chest pain     Noemi Chapel, MD 02/04/21 [id number]

## 2021-01-25 NOTE — H&P (Addendum)
History and Physical    Logan Martin LSL:[id number] DOB: [birth year] DOA: 01/25/2021  PCP: Logan Beals, NP Consultants:  Nephrology; Scot Dock - vascular Patient coming from:  Home - lives with grandson; NOK: Logan Martin, [phone]  Chief Complaint: SOB  HPI: Logan Martin is a 65 y.o. male with medical history significant of remote ETOH/polysubstance dependence; bladder cancer; chronic pain;  ESRD on MWF HD; HTN; and remote PE presenting with CP/SOB.  About 0230, he woke up from sleep with SOB.  He was wearing his usual O2.  He just got his O2 yesterday for 3L - EMS thought it was appropriate but they increased the O2 to 4L.  Within 10 minutes, he was back to baseline.  He didn't transport with EMS but 2 hours later he developed chest "pounding like a predator was trying to come out."  He called 911 again.  On the way in, he was given 4 ASA and 1 MTG and shortly after arrival in the ER the pain subsided somewhat.  He is still in pain now, feels like something stuck in the middle of his chest.  His breathing is fine now.  He gets noctural SOB on nights when he doesn't have HD that day.  He is compliant with HD.  His dry weight was changed on 8/24 but that hasn't improved symptoms.  No edema.    ED Course: CP/SOB.  May get better after HD but no open spots for HD at this time.  Review of Systems: As per HPI; otherwise review of systems reviewed and negative.   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 Started HD in [place].  Peritoneal dialysis failure  due to ventral hernias.  On NxStage home hemo since 2010. Using RUA AVF.     Hearing aid worn    B/L   Hepatitis C    s/p treatment with Mavyret 03/2017.  undetedctable virus in 03/2017   History of blood transfusion 2007 X 1   History of kidney stones    Hx of constipation    Hypertension    Left inguinal hernia    Peripheral vascular disease ([place])    Pulmonary embolism [facility]) Feb 2012   Treated with coumadin x 1 year   Wears glasses     Past Surgical History:  Procedure Laterality Date   A/V FISTULAGRAM N/A 11/22/2017   Procedure: A/V Fistulagram;  Surgeon: Serafina Mitchell, MD;  Location: [place] [facility];  Service: Cardiovascular;  Laterality: N/A;  rt. arm    ABDOMINAL HERNIA REPAIR  5974    x 2   APPLICATION OF WOUND VAC N/A 08/23/2020   Procedure: APPLICATION OF ABDOMINAL WOUND VAC;  Surgeon: Greer Pickerel, MD;  Location: [place];  Service: General;  Laterality: N/A;   AV FISTULA PLACEMENT Right 2007   upper arm   AV FISTULA REPAIR Right ~ 03/2016 & 04/2016   BLADDER AUGMENTATION  1975   COLONOSCOPY  COLONOSCOPY WITH PROPOFOL N/A 08/22/2020   Procedure: COLONOSCOPY WITH PROPOFOL;  Surgeon: Jackquline Denmark, MD;  Location[facility] ENDOSCOPY;  Service: Endoscopy;  Laterality: N/A;   CORONARY ANGIOPLASTY WITH STENT PLACEMENT  1999   CYSTOSCOPY W/ URETERAL STENT PLACEMENT N/A 10/13/2017   Procedure: CYSTOSCOPY WITH IRRIGATION OF BLADDER PLACEMENT OF [place]LEY;  Surgeon: Ceasar Mons, MD;  Location: [place];  Service: Urology;  Laterality: N/A;   CYSTOSCOPY WITH FULGERATION N/A 03/27/2018   Procedure: CYSTOSCOPY WITH FULGERATION AND MUCOUS EVACUATION, BLADDER BIOPSY;  Surgeon: Franchot Gallo, MD;  Location: [facility];  Service: Urology;  Laterality: N/A;   ESOPHAGOGASTRODUODENOSCOPY     ESOPHAGOGASTRODUODENOSCOPY N/A 08/24/2020   Procedure: ESOPHAGOGASTRODUODENOSCOPY (EGD);  Surgeon: Jackquline Denmark, MD;  Location: [facility] ENDOSCOPY;  Service: Endoscopy;  Laterality: N/A;    EXTRACORPOREAL SHOCK WAVE LITHOTRIPSY     FISTULA SUPERFICIALIZATION Right 02/03/2425   Procedure: PLICATION OF ANEURYSM OF ARTERIOVENOUS FISTULA;  Surgeon: Angelia Mould, MD;  Location: [place];  Service: Vascular;  Laterality: Right;   HERNIA REPAIR     INGUINAL HERNIA REPAIR Right [place] Left 03/04/2019   Procedure: LEFT OPEN INGUINAL HERNIA REPAIR WITH MESH;  Surgeon: Kinsinger, Arta Bruce, MD;  Location: [place];  Service: General;  Laterality: Left;  GENERAL AND LMA   INSERTION OF DIALYSIS CATHETER Left 03/15/2020   Procedure: INSERTION OF Left Internal Jugular DIALYSIS CATHETER;  Surgeon: [name], MD;  Location: [place];  Service: Vascular;  Laterality: Left;   IR FLUORO GUIDE CV LINE LEFT  03/11/2020   IR FLUORO GUIDE CV LINE LEFT  08/20/2020   IR US GUIDE VASC ACCESS LEFT  03/11/2020   IR US GUIDE VASC ACCESS LEFT  08/20/2020   IR US GUIDE VASC ACCESS RIGHT  08/20/2020   IR VENO/JUGULAR RIGHT  08/20/2020   LAPAROTOMY N/A 08/23/2020   Procedure: EXPLORATORY LAPAROTOMY;  Surgeon: Greer Pickerel, MD;  Location: [place];  Service: General;  Laterality: N/A;   LAPAROTOMY N/A 08/25/2020   Procedure: RE EXPLORE EXPLORATORY LAPAROTOMY, WASHOUT AND CLOSURE;  Surgeon: Greer Pickerel, MD;  Location: [place];  Service: General;  Laterality: N/A;   LEFT HEART CATH AND CORONARY ANGIOGRAPHY N/A 05/06/2020   Procedure: LEFT HEART CATH AND CORONARY ANGIOGRAPHY;  Surgeon: Nigel Mormon, MD;  Location: [place] [facility];  Service: Cardiovascular;  Laterality: N/A;   PERIPHERAL VASCULAR BALLOON ANGIOPLASTY  11/22/2017   Procedure: PERIPHERAL VASCULAR BALLOON ANGIOPLASTY;  Surgeon: Serafina Mitchell, MD;  Location: [place] [facility];  Service: Cardiovascular;;  rt. arm fistula   PERIPHERAL VASCULAR BALLOON ANGIOPLASTY Right 09/27/2020   Procedure: PERIPHERAL VASCULAR BALLOON ANGIOPLASTY;  Surgeon: Serafina Mitchell, MD;  Location: [place] [facility];  Service: Cardiovascular;   Laterality: Right;  arm fistula   PERIPHERAL VASCULAR CATHETERIZATION N/A 06/22/2016   Procedure: Left Arm Venography;  Surgeon: Elam Dutch, MD;  Location: [place] [facility];  Service: Cardiovascular;  Laterality: N/A;   PERIPHERAL VASCULAR CATHETERIZATION N/A 06/22/2016   Procedure: A/V Fistulagram - Right Arm;  Surgeon: Elam Dutch, MD;  Location: [place] [facility];  Service: Cardiovascular;  Laterality: N/A;   PERIPHERAL VASCULAR CATHETERIZATION Right 06/22/2016   Procedure: Peripheral Vascular Balloon Angioplasty;  Surgeon: Elam Dutch, MD;  Location: [place] [facility];  Service: Cardiovascular;  Laterality: Right;  arm fistula   POLYPECTOMY  08/22/2020   Procedure: POLYPECTOMY;  Surgeon: Jackquline Denmark, MD;  Location: [facility] ENDOSCOPY;  Service: Endoscopy;;  REVISON OF ARTERIOVENOUS FISTULA Right 03/15/2020   Procedure: RIGHT ARM ARTERIOVENOUS FISTULA REVISON;  Surgeon: [name], MD;  Location: [place];  Service: Vascular;  Laterality: Right;   SPLENECTOMY, TOTAL N/A 08/23/2020   Procedure: SPLENECTOMY;  Surgeon: Greer Pickerel, MD;  Location: [place];  Service: General;  Laterality: N/A;    Social History   Socioeconomic History   Marital status: Single    Spouse name: Not on file   Number of children: Not on file   Years of education: Not on file   Highest education level: Not on file  Occupational History   Occupation: disabled  Tobacco Use   Smoking status: Former    Packs/day: 1.00    Years: 5.00    Pack years: 5.00    Types: Cigarettes    Quit date: 05/28/1993    Years since quitting: 27.6   Smokeless tobacco: Never  Vaping Use   Vaping Use: Never used  Substance and Sexual Activity   Alcohol use: Not Currently    Comment:  "nothing since 1995"   Drug use: Not Currently    Types: Marijuana, Cocaine    Comment: denies 03/02/19   Sexual activity: Yes  Other Topics Concern   Not on file  Social History Narrative   Not on file   Social Determinants of  Health   Financial Resource Strain: Not on file  Food Insecurity: Not on file  Transportation Needs: Not on file  Physical Activity: Not on file  Stress: Not on file  Social Connections: Not on file  Intimate Partner Violence: Not on file    Allergies  Allergen Reactions   Buprenorphine Nausea Only   Other Other (See Comments)    Allergic to sulfates   Trazodone And Nefazodone Other (See Comments)    Trazodone might have caused hallucinations   Heparin Rash and Other (See Comments)    NO PORK- RELIGIOUS REASONS   Pork-Derived Products Rash and Other (See Comments)    NO PORK- RELIGIOUS REASONS   Sulfa Antibiotics Rash    Family History  Problem Relation Age of Onset   Cancer Mother    Diabetes Mother    Hypertension Mother    Deep vein thrombosis Daughter     Prior to Admission medications   Medication Sig Start Date End Date Taking? Authorizing Provider  aspirin EC 81 MG tablet Take 81 mg by mouth at bedtime.     [provider]  atorvastatin (LIPITOR) 40 MG tablet Take 40 mg by mouth daily. 01/20/20   [provider]  AURYXIA 1 GM 210 MG(Fe) tablet Take 420 mg by mouth 3 (three) times daily. 02/12/20   [provider]  calcitRIOL (ROCALTROL) 0.25 MCG capsule Take 0.25 mcg by mouth daily.    [provider]  cinacalcet (SENSIPAR) 30 MG tablet Take 30 mg by mouth daily.    [provider]  hydrALAZINE (APRESOLINE) 25 MG tablet Take 1 tablet (25 mg total) by mouth 3 (three) times daily. 05/07/20   Dana Allan I, MD  isosorbide mononitrate (IMDUR) 30 MG 24 hr tablet Take 30 mg by mouth daily.  08/15/18   [provider]  labetalol (NORMODYNE) 100 MG tablet Take 100 mg by mouth 3 (three) times daily. Except on Mon Wed Fri take 2 tablets daily 08/03/20   [provider]  lidocaine-prilocaine (EMLA) cream Apply 1 application topically See admin instructions. Apply small amount to dialysis port (AVF) one hour before  dialysis. Cover with  occlusive dressing (saran wrap) 11/14/17   [provider]  Multiple Vitamins-Minerals (MULTIVITAMIN WITH MINERALS) tablet Take 1 tablet by mouth daily.    [provider]  oxycodone (ROXICODONE) 30 MG immediate release tablet Take 30 mg by mouth in the morning, at noon, and at bedtime.    [provider]  Oxycodone HCl 10 MG TABS Take 10 mg by mouth 2 (two) times daily as needed for moderate pain. 04/29/20   [provider]  traZODone (DESYREL) 150 MG tablet Take 150 mg by mouth at bedtime as needed for sleep. 08/17/20   [provider]  Vitamin D, Ergocalciferol, (DRISDOL) 1.25 MG (50000 UNIT) CAPS capsule Take 50,000 Units by mouth once a week. 08/03/20   [provider]    Physical Exam: Vitals:   01/25/21 0800 01/25/21 0830 01/25/21 0935 01/25/21 1402  BP:  136/77 (!) 146/73 (!) 121/47  Pulse:  (!) 102 (!) 104 (!) 106  Resp:  (!) 22 18 20   Temp:      SpO2:  94% 98% 98%  Weight: 46.6 kg        General:  Appears calm and comfortable and is in NAD, on 3L [place] O2 Eyes:  PERRL, EOMI, normal lids, iris ENT:  grossly normal hearing, lips & tongue, mmm; suboptimal dentition Neck:  no LAD, masses or thyromegaly Cardiovascular:  RRR, no m/r/g. No LE edema.  Respiratory:   CTA bilaterally with no wheezes/rales/rhonchi.  Normal respiratory effort. Abdomen:  soft, NT, ND Skin:  no rash or induration seen on limited exam Musculoskeletal:  grossly normal tone BUE/BLE, good ROM, no bony abnormality Psychiatric:  grossly normal mood and affect, speech fluent and appropriate, AOx3 Neurologic:  CN 2-12 grossly intact, moves all extremities in coordinated fashion    Radiological Exams on Admission: Independently reviewed - see discussion in A/P where applicable  DG Chest 2 View  Result Date: 01/25/2021 CLINICAL DATA:  Chest pain and shortness of breath EXAM: CHEST - 2 VIEW COMPARISON:  Seven days ago FINDINGS: Chronic  cardiomegaly. Interstitial coarsening which is generalized, with small left pleural effusion. Right brachiocephalic stenting. Heavily calcified fistula in the right arm. Left upper extremity dialysis access stenting as well. IMPRESSION: CHF pattern Electronically Signed   By: Monte Fantasia M.D.   On: 01/25/2021 07:17    EKG: Independently reviewed.  Sinus tachycardia with rate 105; prolonged QTc 512; RBBB; NSCSLT   Labs on Admission: I have personally reviewed the available labs and imaging studies at the time of the admission.  Pertinent labs:   BUN 50/Creatinine 9.01/GFR 6 - stable HS troponin 90. 99 WBC 6.1 Hgb 10.4 - stable   Assessment/Plan Principal Problem:   Volume overload Active Problems:   ESRD on dialysis ([place])   Hypertension with heart disease   Hypercholesterolemia   Chronic low back pain   Prolonged QT interval    Volume overload in an ESRD on HD patient -Suspect this is less related to heart failure and more related to inaccurate dry weight leading to volume overload -Since he is dialysis-dependent, he was unable to clear the excess fluid -He likely needs a lower dry weight, even though it was recently decreased -He is likely to benefit from serial HD both today and tomorrow and may be appropriate for d/c to home tomorrow after HD -Certainly, he needs fluid restriction and very low salt intake on an ongoing basis  -Patient on chronic MWF HD -Nephrology prn order set utilized -Dr. Justin Mend was notified that  patient will need HD  -His goal is to go back to home HD -Continue Auryxia, Sensipar, Calcitriol, Lokelma  Chronic pain -I have reviewed this patient in the [place] Controlled Substances Reporting System.  He is receiving medications from only one provider and appears to be taking them as prescribed. -He is at particularly high risk of opioid misuse, diversion, or overdose.  -Will continue home meds with no escalation of meds. -Strongly suggest outpatient tapering  of medications if possible.  CAD -Continue ASA, Imdur -Had chest pressure that is likely related to volume overload -Troponin slightly elevated but this  -Will trend troponin post-HD if he has ongoing CP  HTN -Continue Labetalol, Hydralazine  HLD  -Continue Lipitor  Prolonged QT interval -Likely associated with renal dysfunction, volume loss -Will attempt to avoid QT-prolonging medications such as PPI, nausea meds, SSRIs    Note: This patient has been tested and is pending for the novel coronavirus COVID-19. The patient has been fully vaccinated against COVID-19.   Level of care: Telemetry Cardiac DVT prophylaxis: Heparin Code Status:  Full - confirmed with patient Family Communication: None present; I spoke with the patient's granddaughter by telephone at the time of admission. Disposition Plan:  The patient is from: home  Anticipated d/c is to: home without [facility] services   Anticipated d/c date will depend on clinical response to treatment, but possibly as early as tomorrow if he has excellent response to treatment  Patient is currently: acutely ill Consults called: Nephrology  Admission status:  It is my clinical opinion that referral for OBSERVATION is reasonable and necessary in this patient based on the above information provided. The aforementioned taken together are felt to place the patient at high risk for further clinical deterioration. However it is anticipated that the patient may be medically stable for discharge from the hospital within 24 to 48 hours.    Karmen Bongo MD [facility]   How to contact the [facility] Attending or Consulting provider [place] or covering provider during after hours [place], for this patient?  Check the care team in [facility] and look for a) attending/consulting TRH provider listed and b) the [facility] team listed Log into [email or web address] and use [place]'s universal password to access. If you do not have the password, please contact the  hospital operator. Locate the [facility] provider you are looking for under [facility] and page to a number that you can be directly reached. If you still have difficulty reaching the provider, please page the [facility] (Director on Call) for the Hospitalists listed on amion for assistance.   01/25/2021, 3:05 PM

## 2021-01-26 DIAGNOSIS — E8770 Fluid overload, unspecified: Secondary | ICD-10-CM | POA: Diagnosis not present

## 2021-01-26 DIAGNOSIS — I509 Heart failure, unspecified: Secondary | ICD-10-CM | POA: Diagnosis not present

## 2021-01-26 NOTE — Progress Notes (Addendum)
DISCHARGE NOTE HOME JAMONE GARRIDO to be discharged Home per MD order. Discussed prescriptions and follow up appointments with the patient. Prescriptions given to patient; medication list explained in detail. Patient verbalized understanding.  Skin clean, dry and intact without evidence of skin break down, no evidence of skin tears noted. IV catheter discontinued intact. Site without signs and symptoms of complications. Dressing and pressure applied. Pt denies pain at the site currently. No complaints noted.  Patient free of lines, drains, and wounds.   Pt left with no O2. Will continue O2 at home. Pt is stable and resting good.  An After Visit Summary (AVS) was printed and given to the patient. Patient escorted via wheelchair, and discharged home via cab, cab voucher given to pt prepared by case manager.  Roosevelt Locks, RN

## 2021-01-26 NOTE — Discharge Summary (Signed)
Physician Discharge Summary  Logan Martin WCB:[id number] DOB: [birth year] DOA: 01/25/2021  PCP: [name], NP  Admit date: 01/25/2021 Discharge date: 01/26/2021  Admitted From: home Discharge disposition: home   Code Status: Full Code   Discharge Diagnosis:   Principal Problem:   Volume overload Active Problems:   ESRD on dialysis ([place])   Hypertension with heart disease   Hypercholesterolemia   Chronic low back pain   Prolonged QT interval   Chief Complaint  Patient presents with   Chest Pain   Shortness of Breath    Brief narrative: Logan Martin is a 65 y.o. male with PMH significant for ESRD-HD-MWF, HTN, remote history of ETOH/polysubstance dependence; bladder cancer; chronic pain and remote PE. Patient presented to the ED on 8/31 with complaint of chest pain, shortness of breath.  He described chest pain as "pounding like a predator was trying to come out."   EMS gave him 4 baby aspirin's and 1 nitroglycerin on the way after his chest pain subsided by the time of presentation to the ED.   In the ED, patient was afebrile, heart rate over 100, blood pressure 147/89, was breathing on room air at the time Lab with WBC normal, hemoglobin 10.4, troponin elevated to 90 Respiratory virus panel negative for COVID as well as influenza. Chest x-ray showed generalized interstitial coarsening with a small left pleural effusion  Patient was admitted to hospital service for further evaluation management Nephrology consultation was obtained  Subjective: Patient was seen and examined this am.  Lying down in bed. Not in distress. Wants to go home today.  Chart reviewed Underwent dialysis yesterday.  Blood pressure normalized overnight  Hospital course: Volume overload  ESRD-HD-MWF -Presented with shortness of breath, chest x-ray with volume overload.   -Dialyzed on Wednesday as it was also his routine day of dialysis.   -Nephrology consult appreciated. -Continue  Auryxia, Sensipar, Calcitriol, Lokelma  Chronic respiratory failure with hypoxia -Continue 2 L oxygen at home as before.   Chronic pain -Continue home meds.  Admitting physician checked the controlled substance database. -He is at particularly high risk of opioid misuse, diversion, or overdose.  -Strongly suggest outpatient tapering of medications if possible.   CAD -Continue ASA, Imdur   HTN -Continue Labetalol, Hydralazine   HLD  -Continue Lipitor   Prolonged QT interval -Likely associated with renal dysfunction, volume loss  Stable for discharge to home today.   Allergies as of 01/26/2021       Reactions   Buprenorphine Nausea Only   Other Other (See Comments)   Allergic to sulfates   Trazodone And Nefazodone Other (See Comments)   Trazodone might have caused hallucinations   Heparin Rash, Other (See Comments)   NO PORK- RELIGIOUS REASONS   Pork-derived Products Rash, Other (See Comments)   NO PORK- RELIGIOUS REASONS   Sulfa Antibiotics Rash        Medication List     STOP taking these medications    traZODone 150 MG tablet Commonly known as: DESYREL       TAKE these medications    aspirin EC 81 MG tablet Take 81 mg by mouth at bedtime.   atorvastatin 40 MG tablet Commonly known as: LIPITOR Take 40 mg by mouth daily.   Auryxia 1 GM 210 MG(Fe) tablet Generic drug: ferric citrate Take 420 mg by mouth 3 (three) times daily after meals.   calcitRIOL 0.25 MCG capsule Commonly known as: ROCALTROL Take 0.25 mcg by mouth daily.  cinacalcet 30 MG tablet Commonly known as: SENSIPAR Take 30 mg by mouth daily.   hydrALAZINE 25 MG tablet Commonly known as: APRESOLINE Take 1 tablet (25 mg total) by mouth 3 (three) times daily.   isosorbide mononitrate 30 MG 24 hr tablet Commonly known as: IMDUR Take 30 mg by mouth daily.   labetalol 100 MG tablet Commonly known as: NORMODYNE Take 100 mg by mouth See admin instructions. Take 100 mg by mouth three  times on Sun/Tues/Thurs/Sat and two times a day on Mon/Wed/Fri   lidocaine-prilocaine cream Commonly known as: EMLA Apply 1 application topically See admin instructions. Apply small amount to dialysis port (AVF) one hour before dialysis. Cover with occlusive dressing (saran wrap)   Lokelma 5 g packet Generic drug: sodium zirconium cyclosilicate Take 5 g by mouth daily.   Melatonin 10 MG Tabs Take 10 mg by mouth at bedtime as needed (for sleep).   multivitamin with minerals tablet Take 1 tablet by mouth 2 (two) times daily after a meal.   oxycodone 30 MG immediate release tablet Commonly known as: ROXICODONE Take 30 mg by mouth See admin instructions. Take 30 mg by mouth three times a day on Sun/Tues/Thurs/Sat and two times a day on Mon/Wed/Fri, after dialysis What changed: Another medication with the same name was removed. Continue taking this medication, and follow the directions you see here.   Oxycodone HCl 10 MG Tabs Take 10 mg by mouth 2 (two) times daily as needed for moderate pain. What changed: Another medication with the same name was removed. Continue taking this medication, and follow the directions you see here.   OXYGEN Inhale 2 L/min into the lungs continuous.   Vitamin D (Ergocalciferol) 1.25 MG (50000 UNIT) Caps capsule Commonly known as: DRISDOL Take 50,000 Units by mouth every Thursday.        Discharge Instructions:  Diet Recommendation:  Discharge Diet Orders (From admission, onward)     Start     Ordered   01/26/21 0000  Diet - low sodium heart healthy       Comments: Renl diet   01/26/21 1039              Follow with Primary MD [name], NP in 7 days   Get CBC/BMP checked in next visit within 1 week by PCP or SNF MD ( we routinely change or add medications that can affect your baseline labs and fluid status, therefore we recommend that you get the mentioned basic workup next visit with your PCP, your PCP may decide not to get them  or add new tests based on their clinical decision)  On your next visit with your PCP, please Get Medicines reviewed and adjusted.  Please request your PCP  to go over all Hospital Tests and Procedure/Radiological results at the follow up, please get all Hospital records sent to your Prim MD by signing hospital release before you go home.  Activity: As tolerated with Full fall precautions use walker/cane & assistance as needed  For Heart failure patients - Check your Weight same time everyday, if you gain over 2 pounds, or you develop in leg swelling, experience more shortness of breath or chest pain, call your Primary MD immediately. Follow Cardiac Low Salt Diet and 1.5 lit/day fluid restriction.  If you have smoked or chewed Tobacco in the last 2 yrs please stop smoking, stop any regular Alcohol  and or any Recreational drug use.  If you experience worsening of your admission symptoms, develop shortness  of breath, life threatening emergency, suicidal or homicidal thoughts you must seek medical attention immediately by calling 911 or calling your MD immediately  if symptoms less severe.  You Must read complete instructions/literature along with all the possible adverse reactions/side effects for all the Medicines you take and that have been prescribed to you. Take any new Medicines after you have completely understood and accpet all the possible adverse reactions/side effects.   Do not drive, operate heavy machinery, perform activities at heights, swimming or participation in water activities or provide baby sitting services if your were admitted for syncope or siezures until you have seen by Primary MD or a Neurologist and advised to do so again.  Do not drive when taking Pain medications.  Do not take more than prescribed Pain, Sleep and Anxiety Medications  Wear Seat belts while driving.   Please note You were cared for by a hospitalist during your hospital stay. If you have any questions  about your discharge medications or the care you received while you were in the hospital after you are discharged, you can call the unit and asked to speak with the hospitalist on call if the hospitalist that took care of you is not available. Once you are discharged, your primary care physician will handle any further medical issues. Please note that NO REFILLS for any discharge medications will be authorized once you are discharged, as it is imperative that you return to your primary care physician (or establish a relationship with a primary care physician if you do not have one) for your aftercare needs so that they can reassess your need for medications and monitor your lab values.    Follow ups:    Follow-up Information     [name], NP Follow up.   Contact information: [place] [address] [phone]                 Wound care:   Incision (Closed) 08/23/20 Abdomen Other (Comment) (Active)  Date First Assessed/Time First Assessed: 08/23/20 1714   Location: Abdomen  Location Orientation: Other (Comment)    Assessments 08/23/2020  8:00 PM 08/30/2020  4:08 AM  Dressing Type Negative pressure wound therapy Abdominal pads;Moist to dry  Dressing Clean;Dry;Intact Changed  Dressing Change Frequency -- Twice a day  Site / Wound Assessment Dressing in place / Unable to assess Clean;Dry;Red  Drainage Amount -- Scant  Drainage Description -- Serosanguineous  Treatment Negative pressure wound therapy --     No Linked orders to display     Incision (Closed) 08/25/20 Abdomen (Active)  Date First Assessed/Time First Assessed: 08/25/20 1100   Location: Abdomen    Assessments 08/25/2020 12:00 PM 08/28/2020  4:30 PM  Dressing Type Moist to dry Abdominal pads;Gauze (Comment);Moist to dry  Dressing Clean;Dry;Intact (S) Changed  Dressing Change Frequency Twice a day Twice a day  Site / Wound Assessment Clean;Dry Pink;Yellow  Margins -- Attached edges  (approximated)  Closure -- Staples  Drainage Amount -- Scant  Drainage Description -- Serosanguineous  Treatment -- Cleansed     No Linked orders to display     Incision (Closed) 08/25/20 Abdomen Left (Active)  Date First Assessed/Time First Assessed: 08/25/20 1112   Location: Abdomen  Location Orientation: Left    Assessments 08/25/2020 12:00 PM 08/26/2020  8:00 AM  Dressing Type Moist to dry Transparent dressing  Dressing Clean;Dry;Intact Clean;Dry;Intact  Dressing Change Frequency Twice a day --  Site / Wound Assessment Clean;Dry Clean;Dry  Drainage Amount --  None     No Linked orders to display     Pressure Injury 08/27/20 Buttocks Right Stage 2 -  Partial thickness loss of dermis presenting as a shallow open injury with a red, pink wound bed without slough. (Active)  Date First Assessed/Time First Assessed: 08/27/20 2000   Location: Buttocks  Location Orientation: Right  Staging: Stage 2 -  Partial thickness loss of dermis presenting as a shallow open injury with a red, pink wound bed without slough.  Present on A...    Assessments 08/27/2020  8:00 PM 08/30/2020  8:37 PM  Dressing Type -- Foam - Lift dressing to assess site every shift  Dressing -- Clean;Dry;Intact  Dressing Change Frequency -- PRN  Site / Wound Assessment Clean;Dry --  Margins Attached edges (approximated) --  Drainage Amount None --  Treatment Off loading --     No Linked orders to display     Wound / Incision (Open or Dehisced) 08/29/20 (MARSI) Medical Adhesive-Related Skin Injury Buttocks (Active)  Date First Assessed/Time First Assessed: 08/29/20 1806   Wound Type: (MARSI) Medical Adhesive-Related Skin Injury  Location: Buttocks    No assessment data to display     No Linked orders to display    Discharge Exam:   Vitals:   01/25/21 2300 01/25/21 2321 01/26/21 0506 01/26/21 0827  BP: 134/66 (!) 141/62 140/72 (!) 135/57  Pulse: 74 75 82 75  Resp: 20 18 18    Temp: 97.9 F (36.6 C) 97.8 F (36.6 C)  97.6 F (36.4 C) 98.1 F (36.7 C)  TempSrc: Oral Oral Oral Oral  SpO2: 99% 100% 100% 92%  Weight: 46.6 kg     Height:        Body mass index is 17.63 kg/m.  General exam: Pleasant, middle-aged African-American male.  Looks older for his age.  Not in distress Skin: No rashes, lesions or ulcers. HEENT: Atraumatic, normocephalic, no obvious bleeding Lungs: Clear to auscultation bilaterally CVS: Regular rate and rhythm, no murmur GI/Abd soft, nontender, nondistended, bowel sound present CNS: Alert, awake, oriented x3 Psychiatry: Mood appropriate Extremities: No pedal edema, no calf tenderness  Time coordinating discharge: 35 minutes   The results of significant diagnostics from this hospitalization (including imaging, microbiology, ancillary and laboratory) are listed below for reference.    Procedures and Diagnostic Studies:   DG Chest 2 View  Result Date: 01/25/2021 CLINICAL DATA:  Chest pain and shortness of breath EXAM: CHEST - 2 VIEW COMPARISON:  Seven days ago FINDINGS: Chronic cardiomegaly. Interstitial coarsening which is generalized, with small left pleural effusion. Right brachiocephalic stenting. Heavily calcified fistula in the right arm. Left upper extremity dialysis access stenting as well. IMPRESSION: CHF pattern Electronically Signed   By: Monte Fantasia M.D.   On: 01/25/2021 07:17     Labs:   Basic Metabolic Panel: Recent Labs  Lab 01/25/21 0623  NA 138  K 4.6  CL 99  CO2 22  GLUCOSE 86  BUN 50*  CREATININE 9.01*  CALCIUM 8.5*   GFR Estimated Creatinine Clearance: 5.5 mL/min (A) (by C-G formula based on SCr of 9.01 mg/dL (H)). Liver Function Tests: No results for input(s): AST, ALT, ALKPHOS, BILITOT, PROT, ALBUMIN in the last 168 hours. No results for input(s): LIPASE, AMYLASE in the last 168 hours. No results for input(s): AMMONIA in the last 168 hours. Coagulation profile No results for input(s): INR, PROTIME in the last 168  hours.  CBC: Recent Labs  Lab 01/25/21 0623  WBC 6.1  HGB  10.4*  HCT 34.4*  MCV 105.8*  PLT 167   Cardiac Enzymes: No results for input(s): CKTOTAL, CKMB, CKMBINDEX, TROPONINI in the last 168 hours. BNP: Invalid input(s): POCBNP CBG: No results for input(s): GLUCAP in the last 168 hours. D-Dimer No results for input(s): DDIMER in the last 72 hours. Hgb A1c No results for input(s): HGBA1C in the last 72 hours. Lipid Profile No results for input(s): CHOL, HDL, LDLCALC, TRIG, CHOLHDL, LDLDIRECT in the last 72 hours. Thyroid function studies No results for input(s): TSH, T4TOTAL, T3FREE, THYROIDAB in the last 72 hours.  Invalid input(s): FREET3 Anemia work up No results for input(s): VITAMINB12, FOLATE, FERRITIN, TIBC, IRON, RETICCTPCT in the last 72 hours. Microbiology Recent Results (from the past 240 hour(s))  Resp Panel by RT-PCR (Flu A&B, Covid) Nasopharyngeal Swab     Status: None   Collection Time: 01/25/21  4:02 PM   Specimen: Nasopharyngeal Swab; Nasopharyngeal(NP) swabs in vial transport medium  Result Value Ref Range Status   SARS Coronavirus 2 by RT PCR NEGATIVE NEGATIVE Final    Comment: (NOTE) SARS-CoV-2 target nucleic acids are NOT DETECTED.  The SARS-CoV-2 RNA is generally detectable in upper respiratory specimens during the acute phase of infection. The lowest concentration of SARS-CoV-2 viral copies this assay can detect is 138 copies/mL. A negative result does not preclude SARS-Cov-2 infection and should not be used as the sole basis for treatment or other patient management decisions. A negative result may occur with  improper specimen collection/handling, submission of specimen other than nasopharyngeal swab, presence of viral mutation(s) within the areas targeted by this assay, and inadequate number of viral copies(<138 copies/mL). A negative result must be combined with clinical observations, patient history, and epidemiological information. The  expected result is Negative.  Fact Sheet for Patients:  EntrepreneurPulse.com.au  Fact Sheet for Healthcare Providers:  IncredibleEmployment.be  This test is no t yet approved or cleared by the [place] FDA and  has been authorized for detection and/or diagnosis of SARS-CoV-2 by FDA under an Emergency Use Authorization (EUA). This EUA will remain  in effect (meaning this test can be used) for the duration of the COVID-19 declaration under Section 564(b)(1) of the Act, 21 U.S.C.section 360bbb-3(b)(1), unless the authorization is terminated  or revoked sooner.       Influenza A by PCR NEGATIVE NEGATIVE Final   Influenza B by PCR NEGATIVE NEGATIVE Final    Comment: (NOTE) The Xpert Xpress SARS-CoV-2/FLU/RSV plus assay is intended as an aid in the diagnosis of influenza from Nasopharyngeal swab specimens and should not be used as a sole basis for treatment. Nasal washings and aspirates are unacceptable for Xpert Xpress SARS-CoV-2/FLU/RSV testing.  Fact Sheet for Patients: EntrepreneurPulse.com.au  Fact Sheet for Healthcare Providers: IncredibleEmployment.be  This test is not yet approved or cleared by the [place] FDA and has been authorized for detection and/or diagnosis of SARS-CoV-2 by FDA under an Emergency Use Authorization (EUA). This EUA will remain in effect (meaning this test can be used) for the duration of the COVID-19 declaration under Section 564(b)(1) of the Act, 21 U.S.C. section 360bbb-3(b)(1), unless the authorization is terminated or revoked.  Performed at [facility], [place] [address], [place], [place] [address]      Signed: Terrilee Croak  [facility] 01/26/2021, 10:41 AM

## 2021-01-26 NOTE — TOC Transition Note (Signed)
Transition of Care [facility]) - CM/SW Discharge Note   Patient Details  Name: Logan Martin MRN: [id number] Date of Birth: April 04, 1956  [facility] of [facility] CM/SW Contact:  Sable Feil, LCSW Phone Number: 01/26/2021, 12:05 PM   Clinical Narrative:  Patient medically stable for discharge and is going home. [name] requested transportation assistance and this was arranged via [employer]. CSW confirmed patient's address, and it is address shown in Epic. Waiver form not needed, as per transportation staff person, they already have one on patient.        Patient Goals and CMS Choice        Discharge Placement - patient going home                       Discharge Plan and Services  Patient requested transportation home                                   Social Determinants of Health (SDOH) Interventions  No SDOH interventions requested or needed at discharge    Readmission Risk Interventions Readmission Risk Prevention Plan 01/10/2021  Transportation Screening Complete  Medication Review [employer]) Complete  PCP or Specialist appointment within 3-5 days of discharge Complete  HRI or [place] Complete  SW Recovery Care/Counseling Consult Complete  [place] Not Applicable  Some recent data might be hidden

## 2021-01-27 ENCOUNTER — Telehealth (HOSPITAL_COMMUNITY): Payer: Self-pay | Admitting: Nephrology

## 2021-01-27 LAB — HEPATITIS B SURFACE ANTIBODY, QUANTITATIVE: Hep B S AB Quant (Post): 128 m[IU]/mL (ref >9.9)

## 2021-01-27 NOTE — Telephone Encounter (Signed)
Transition of care contact from inpatient facility  Date of discharge: 01/26/21 Date of contact: 01/27/21 Method: Phone Spoke to: Patient  Patient contacted to discuss transition of care from recent inpatient hospitalization. Patient was admitted to [facility] from 8/31-01/26/21 with discharge diagnosis of dyspnea/volume overload  Medication list reviewed - no changes.  S/p HD earlier today - says went fine, but cramping at the very end. Says dyspnea much improved though. Will leave EDW as is now, re-address next week during his dialysis rounds.   No other needs today.  Veneta Penton, PA-C [employer] Pager [phone]

## 2021-02-02 ENCOUNTER — Other Ambulatory Visit: Payer: Self-pay

## 2021-02-02 ENCOUNTER — Ambulatory Visit (INDEPENDENT_AMBULATORY_CARE_PROVIDER_SITE_OTHER): Payer: Medicare HMO | Admitting: Otolaryngology

## 2021-02-02 DIAGNOSIS — H903 Sensorineural hearing loss, bilateral: Secondary | ICD-10-CM | POA: Diagnosis not present

## 2021-02-02 DIAGNOSIS — H6123 Impacted cerumen, bilateral: Secondary | ICD-10-CM

## 2021-02-02 NOTE — Progress Notes (Signed)
HPI: Logan Martin is a 65 y.o. male who presents is referred by his audiologist for evaluation of questionable ear infection prior to getting new hearing aids.  He has hearing aids that he wears.  He denies any drainage or pain from the ears.  He went to get new hearing aids or get the hearing aids adjusted and they recommended having his ear infections cleared before they adjust his hearing aids..  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 Started HD in [place], [place].  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  On NxStage home hemo since 2010. Using RUA AVF.     Hearing aid worn    B/L   Hepatitis C    s/p treatment with Mavyret 03/2017.  undetedctable virus in 03/2017   History of blood transfusion 2007 X 1   History of kidney stones    Hx of constipation    Hypertension    Left inguinal hernia    Peripheral vascular disease ([place])    Pulmonary embolism [facility]) Feb 2012   Treated with coumadin x 1 year   Wears glasses    Past Surgical History:  Procedure Laterality Date   A/V FISTULAGRAM N/A 11/22/2017   Procedure: A/V Fistulagram;  Surgeon: Serafina Mitchell, MD;  Location: [place] CV [facility];  Service: Cardiovascular;  Laterality: N/A;  rt. arm    ABDOMINAL HERNIA REPAIR  7169    x 2   APPLICATION OF WOUND VAC N/A 08/23/2020   Procedure: APPLICATION OF ABDOMINAL WOUND VAC;  Surgeon: Greer Pickerel, MD;  Location: [place];  Service: General;  Laterality: N/A;   AV FISTULA PLACEMENT Right 2007   upper arm   AV FISTULA REPAIR Right ~ 03/2016 & 04/2016   BLADDER AUGMENTATION  1975   COLONOSCOPY     COLONOSCOPY WITH PROPOFOL N/A 08/22/2020   Procedure: COLONOSCOPY WITH PROPOFOL;  Surgeon: Jackquline Denmark, MD;  Location: [facility] ENDOSCOPY;  Service: Endoscopy;  Laterality: N/A;   CORONARY ANGIOPLASTY WITH STENT PLACEMENT  1999   CYSTOSCOPY W/ URETERAL STENT PLACEMENT N/A 10/13/2017   Procedure: CYSTOSCOPY WITH IRRIGATION OF BLADDER PLACEMENT OF FOLEY;  Surgeon: Ceasar Mons, MD;  Location: [place];  Service: Urology;  Laterality: N/A;   CYSTOSCOPY WITH FULGERATION N/A 03/27/2018   Procedure: CYSTOSCOPY WITH FULGERATION AND MUCOUS EVACUATION, BLADDER BIOPSY;  Surgeon: Franchot Gallo, MD;  Location: [facility];  Service: Urology;  Laterality: N/A;   ESOPHAGOGASTRODUODENOSCOPY     ESOPHAGOGASTRODUODENOSCOPY N/A 08/24/2020   Procedure: ESOPHAGOGASTRODUODENOSCOPY (EGD);  Surgeon: Jackquline Denmark, MD;  Location: [facility] ENDOSCOPY;  Service: Endoscopy;  Laterality: N/A;   EXTRACORPOREAL SHOCK WAVE LITHOTRIPSY     FISTULA SUPERFICIALIZATION Right 11/01/8936   Procedure: PLICATION OF ANEURYSM OF ARTERIOVENOUS FISTULA;  Surgeon: Angelia Mould, MD;  Location: [facility] OR;  Service: Vascular;  Laterality: Right;   HERNIA REPAIR     INGUINAL HERNIA REPAIR Right 1975   INGUINAL HERNIA REPAIR Left 03/04/2019   Procedure: LEFT OPEN INGUINAL HERNIA REPAIR WITH MESH;  Surgeon: Kinsinger, Arta Bruce, MD;  Location: [place];  Service: General;  Laterality: Left;  GENERAL AND LMA   INSERTION OF DIALYSIS CATHETER Left 03/15/2020   Procedure: INSERTION OF Left Internal Jugular DIALYSIS CATHETER;  Surgeon: [name], MD;  Location: [place] OR;  Service: Vascular;  Laterality: Left;   IR FLUORO GUIDE CV LINE LEFT  03/11/2020   IR FLUORO GUIDE CV LINE LEFT  08/20/2020   IR US GUIDE VASC ACCESS LEFT  03/11/2020   IR US GUIDE VASC ACCESS LEFT  08/20/2020   IR US GUIDE VASC ACCESS RIGHT  08/20/2020   IR VENO/JUGULAR RIGHT  08/20/2020   LAPAROTOMY N/A 08/23/2020   Procedure: EXPLORATORY LAPAROTOMY;  Surgeon: Greer Pickerel, MD;  Location: [place];  Service: General;  Laterality: N/A;   LAPAROTOMY N/A 08/25/2020   Procedure: RE  EXPLORE EXPLORATORY LAPAROTOMY, WASHOUT AND CLOSURE;  Surgeon: Greer Pickerel, MD;  Location: [place];  Service: General;  Laterality: N/A;   LEFT HEART CATH AND CORONARY ANGIOGRAPHY N/A 05/06/2020   Procedure: LEFT HEART CATH AND CORONARY ANGIOGRAPHY;  Surgeon: Nigel Mormon, MD;  Location: [place] CV LAB;  Service: Cardiovascular;  Laterality: N/A;   PERIPHERAL VASCULAR BALLOON ANGIOPLASTY  11/22/2017   Procedure: PERIPHERAL VASCULAR BALLOON ANGIOPLASTY;  Surgeon: Serafina Mitchell, MD;  Location: [place] [facility];  Service: Cardiovascular;;  rt. arm fistula   PERIPHERAL VASCULAR BALLOON ANGIOPLASTY Right 09/27/2020   Procedure: PERIPHERAL VASCULAR BALLOON ANGIOPLASTY;  Surgeon: Serafina Mitchell, MD;  Location: [place] [facility];  Service: Cardiovascular;  Laterality: Right;  arm fistula   PERIPHERAL VASCULAR CATHETERIZATION N/A 06/22/2016   Procedure: Left Arm Venography;  Surgeon: Elam Dutch, MD;  Location: [place] CV LAB;  Service: Cardiovascular;  Laterality: N/A;   PERIPHERAL VASCULAR CATHETERIZATION N/A 06/22/2016   Procedure: A/V Fistulagram - Right Arm;  Surgeon: Elam Dutch, MD;  Location: [place] [facility];  Service: Cardiovascular;  Laterality: N/A;   PERIPHERAL VASCULAR CATHETERIZATION Right 06/22/2016   Procedure: Peripheral Vascular Balloon Angioplasty;  Surgeon: Elam Dutch, MD;  Location: [place] [facility];  Service: Cardiovascular;  Laterality: Right;  arm fistula   POLYPECTOMY  08/22/2020   Procedure: POLYPECTOMY;  Surgeon: Jackquline Denmark, MD;  Location: [facility] ENDOSCOPY;  Service: Endoscopy;;   REVISON OF ARTERIOVENOUS FISTULA Right 03/15/2020   Procedure: RIGHT ARM ARTERIOVENOUS FISTULA REVISON;  Surgeon: [name], MD;  Location: [place];  Service: Vascular;  Laterality: Right;   SPLENECTOMY, TOTAL N/A 08/23/2020   Procedure: SPLENECTOMY;  Surgeon: Greer Pickerel, MD;  Location: [place];  Service: General;  Laterality: N/A;   Social History   Socioeconomic  History   Marital status: Single    Spouse name: Not on file   Number of children: Not on file   Years of education: Not on file   Highest education level: Not on file  Occupational History   Occupation: disabled  Tobacco Use   Smoking status: Former    Packs/day: 1.00    Years: 5.00    Pack years: 5.00    Types: Cigarettes    Quit date: 05/28/1993    Years since quitting: 27.7   Smokeless tobacco: Never  Vaping Use   Vaping Use: Never used  Substance and Sexual Activity   Alcohol use: Not Currently    Comment:  "nothing since 1995"   Drug use: Not Currently    Types: Marijuana, Cocaine    Comment: denies 03/02/19   Sexual activity: Yes  Other Topics Concern   Not on file  Social History  Narrative   Not on file   Social Determinants of Health   Financial Resource Strain: Not on file  Food Insecurity: Not on file  Transportation Needs: Not on file  Physical Activity: Not on file  Stress: Not on file  Social Connections: Not on file   Family History  Problem Relation Age of Onset   Cancer Mother    Diabetes Mother    Hypertension Mother    Deep vein thrombosis Daughter    Allergies  Allergen Reactions   Buprenorphine Nausea Only   Other Other (See Comments)    Allergic to sulfates   Trazodone And Nefazodone Other (See Comments)    Trazodone might have caused hallucinations   Heparin Rash and Other (See Comments)    NO PORK- RELIGIOUS REASONS   Pork-Derived Products Rash and Other (See Comments)    NO PORK- RELIGIOUS REASONS   Sulfa Antibiotics Rash   Prior to Admission medications   Medication Sig Start Date End Date Taking? Authorizing Provider  aspirin EC 81 MG tablet Take 81 mg by mouth at bedtime.     [provider]  atorvastatin (LIPITOR) 40 MG tablet Take 40 mg by mouth daily. 01/20/20   [provider]  AURYXIA 1 GM 210 MG(Fe) tablet Take 420 mg by mouth 3 (three) times daily after meals. 02/12/20   [provider]   calcitRIOL (ROCALTROL) 0.25 MCG capsule Take 0.25 mcg by mouth daily. Patient not taking: No sig reported    [provider]  cinacalcet (SENSIPAR) 30 MG tablet Take 30 mg by mouth daily.    [provider]  hydrALAZINE (APRESOLINE) 25 MG tablet Take 1 tablet (25 mg total) by mouth 3 (three) times daily. Patient not taking: No sig reported 05/07/20   Dana Allan I, MD  isosorbide mononitrate (IMDUR) 30 MG 24 hr tablet Take 30 mg by mouth daily.  08/15/18   [provider]  labetalol (NORMODYNE) 100 MG tablet Take 100 mg by mouth See admin instructions. Take 100 mg by mouth three times on Sun/Tues/Thurs/Sat and two times a day on Mon/Wed/Fri 08/03/20   [provider]  lidocaine-prilocaine (EMLA) cream Apply 1 application topically See admin instructions. Apply small amount to dialysis port (AVF) one hour before dialysis. Cover with occlusive dressing (saran wrap) 11/14/17   [provider]  Melatonin 10 MG TABS Take 10 mg by mouth at bedtime as needed (for sleep).    [provider]  Multiple Vitamins-Minerals (MULTIVITAMIN WITH MINERALS) tablet Take 1 tablet by mouth 2 (two) times daily after a meal.    [provider]  oxycodone (ROXICODONE) 30 MG immediate release tablet Take 30 mg by mouth See admin instructions. Take 30 mg by mouth three times a day on Sun/Tues/Thurs/Sat and two times a day on Mon/Wed/Fri, after dialysis    [provider]  Oxycodone HCl 10 MG TABS Take 10 mg by mouth 2 (two) times daily as needed for moderate pain. 04/29/20   [provider]  OXYGEN Inhale 2 L/min into the lungs continuous.    [provider]  sodium zirconium cyclosilicate (LOKELMA) 5 g packet Take 5 g by mouth daily.    [provider]  Vitamin D, Ergocalciferol, (DRISDOL) 1.25 MG (50000 UNIT) CAPS capsule Take 50,000 Units by mouth every Thursday. 08/03/20   [provider]     Positive ROS:  Otherwise negative  All other systems have been reviewed and were otherwise negative with the exception of those  mentioned in the HPI and as above.  Physical Exam: Constitutional: Alert, well-appearing, no acute distress Ears: External ears without lesions or tenderness.  He had a moderate mount of dried hard wax in both ear canals that was cleaned in the office using curette as well as hydrogen peroxide and suction.  On the right side he had a little bit of bleeding because of the adherence of the cerumen to skin deep within the ear canal.  This was irrigated with hydroperoxide as well as Xylocaine with epinephrine to help stop and control the bleeding.  The TMs were clear bilaterally.  With no middle ear effusion noted and no evidence of external otitis.  He does have some medium small ear canals bilaterally. Nasal: External nose without lesions. Septum with minimal deformity.. Clear nasal passages Oral: Lips and gums without lesions. Tongue and palate mucosa without lesions. Posterior oropharynx clear. Neck: No palpable adenopathy or masses Respiratory: Breathing comfortably  Skin: No facial/neck lesions or rash noted.  Cerumen impaction removal  Date/Time: 02/02/2021 4:56 PM Performed by: Rozetta Nunnery, MD Authorized by: Rozetta Nunnery, MD   Consent:    Consent obtained:  Verbal   Consent given by:  Patient   Risks discussed:  Pain and bleeding Procedure details:    Location:  L ear and R ear   Procedure type: curette and suction   Post-procedure details:    Inspection:  TM intact and canal normal   Hearing quality:  Improved   Procedure completion:  Tolerated well, no immediate complications Comments:     Ear canals were cleaned with a curette and suction along with hydroperoxide on the left side where he had a little bit of bleeding.  The TMs were clear bilaterally otherwise.  Assessment: Underlying bilateral sensorineural hearing loss with wax obstructing both  ear canals that was cleaned in the office. No evidence of otitis media or otitis externa.  Plan: Ear canals were cleaned in the office. He will follow-up with his audiologist concerning obtaining new hearing aids. He will follow-up here as needed.   Radene Journey, MD   CC:

## 2021-02-14 ENCOUNTER — Other Ambulatory Visit: Payer: Self-pay

## 2021-02-14 ENCOUNTER — Ambulatory Visit (INDEPENDENT_AMBULATORY_CARE_PROVIDER_SITE_OTHER): Payer: Medicare HMO | Admitting: Otolaryngology

## 2021-02-14 DIAGNOSIS — H9222 Otorrhagia, left ear: Secondary | ICD-10-CM | POA: Diagnosis not present

## 2021-02-14 MED ORDER — CIPROFLOXACIN-DEXAMETHASONE 0.3-0.1 % OT SUSP: 4.0000 [drp] | Freq: Two times a day (BID) | OTIC | 0 refills | Status: AC

## 2021-02-14 NOTE — Progress Notes (Signed)
HPI: Logan Martin is a 65 y.o. male who returns today for evaluation of bleeding from the left ear canal after having this cleaned a couple of days ago.  At that time he has some granulation tissue and apparently had some more bleeding after having this cleaned..  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 Started HD in [place], [place].  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  On NxStage home hemo since 2010. Using RUA AVF.     Hearing aid worn    B/L   Hepatitis C    s/p treatment with Mavyret 03/2017.  undetedctable virus in 03/2017   History of blood transfusion 2007 X 1   History of kidney stones    Hx of constipation    Hypertension    Left inguinal hernia    Peripheral vascular disease ([place])    Pulmonary embolism [facility]) Feb 2012   Treated with coumadin x 1 year   Wears glasses    Past Surgical History:  Procedure Laterality Date   A/V FISTULAGRAM N/A 11/22/2017   Procedure: A/V Fistulagram;  Surgeon: Serafina Mitchell, MD;  Location: [place] [facility];  Service: Cardiovascular;  Laterality: N/A;  rt. arm    ABDOMINAL HERNIA REPAIR  [id number]    x 2   APPLICATION OF WOUND VAC N/A 08/23/2020   Procedure: APPLICATION OF ABDOMINAL WOUND VAC;  Surgeon: Greer Pickerel, MD;  Location: [place];  Service: General;  Laterality: N/A;   AV FISTULA PLACEMENT Right 2007   upper arm   AV FISTULA REPAIR Right ~ 03/2016 & 04/2016   BLADDER AUGMENTATION  1975   COLONOSCOPY     COLONOSCOPY WITH PROPOFOL N/A 08/22/2020   Procedure: COLONOSCOPY WITH PROPOFOL;  Surgeon: Jackquline Denmark, MD;  Location: [facility] ENDOSCOPY;  Service: Endoscopy;  Laterality: N/A;   CORONARY ANGIOPLASTY WITH STENT PLACEMENT  1999    CYSTOSCOPY W/ URETERAL STENT PLACEMENT N/A 10/13/2017   Procedure: CYSTOSCOPY WITH IRRIGATION OF BLADDER PLACEMENT OF FOLEY;  Surgeon: Ceasar Mons, MD;  Location: [place];  Service: Urology;  Laterality: N/A;   CYSTOSCOPY WITH FULGERATION N/A 03/27/2018   Procedure: CYSTOSCOPY WITH FULGERATION AND MUCOUS EVACUATION, BLADDER BIOPSY;  Surgeon: Franchot Gallo, MD;  [facility];  Service: Urology;  Laterality: N/A;   ESOPHAGOGASTRODUODENOSCOPY     ESOPHAGOGASTRODUODENOSCOPY N/A 08/24/2020   Procedure: ESOPHAGOGASTRODUODENOSCOPY (EGD);  Surgeon: Jackquline Denmark, MD;  Location: [facility] ENDOSCOPY;  Service: Endoscopy;  Laterality: N/A;   EXTRACORPOREAL SHOCK WAVE LITHOTRIPSY     FISTULA SUPERFICIALIZATION Right 07/02/9561   Procedure: PLICATION OF ANEURYSM OF ARTERIOVENOUS FISTULA;  Surgeon: Angelia Mould, MD;  Location: [facility] OR;  Service: Vascular;  Laterality: Right;   HERNIA REPAIR     INGUINAL HERNIA REPAIR Right 1975   INGUINAL HERNIA REPAIR Left 03/04/2019   Procedure: LEFT OPEN INGUINAL HERNIA REPAIR WITH MESH;  Surgeon: Kinsinger, Arta Bruce, MD;  Location: [place];  Service: General;  Laterality: Left;  GENERAL AND LMA   INSERTION OF DIALYSIS CATHETER Left 03/15/2020   Procedure: INSERTION OF Left Internal Jugular DIALYSIS CATHETER;  Surgeon: [name], MD;  Location: [facility] OR;  Service: Vascular;  Laterality: Left;   IR FLUORO GUIDE CV LINE LEFT  03/11/2020   IR FLUORO GUIDE CV LINE LEFT  08/20/2020   IR US GUIDE VASC ACCESS LEFT  03/11/2020   IR US GUIDE VASC ACCESS LEFT  08/20/2020   IR US GUIDE VASC ACCESS RIGHT  08/20/2020   IR VENO/JUGULAR RIGHT  08/20/2020   LAPAROTOMY N/A 08/23/2020   Procedure: EXPLORATORY LAPAROTOMY;  Surgeon: Greer Pickerel, MD;  Location: [place];  Service: General;  Laterality: N/A;   LAPAROTOMY N/A 08/25/2020   Procedure: RE EXPLORE EXPLORATORY LAPAROTOMY, WASHOUT AND CLOSURE;  Surgeon: Greer Pickerel, MD;  Location: [place];  Service: General;  Laterality:  N/A;   LEFT HEART CATH AND CORONARY ANGIOGRAPHY N/A 05/06/2020   Procedure: LEFT HEART CATH AND CORONARY ANGIOGRAPHY;  Surgeon: Nigel Mormon, MD;  Location: [place] CV LAB;  Service: Cardiovascular;  Laterality: N/A;   PERIPHERAL VASCULAR BALLOON ANGIOPLASTY  11/22/2017   Procedure: PERIPHERAL VASCULAR BALLOON ANGIOPLASTY;  Surgeon: Serafina Mitchell, MD;  Location: [place] CV LAB;  Service: Cardiovascular;;  rt. arm fistula   PERIPHERAL VASCULAR BALLOON ANGIOPLASTY Right 09/27/2020   Procedure: PERIPHERAL VASCULAR BALLOON ANGIOPLASTY;  Surgeon: Serafina Mitchell, MD;  Location: [place] [facility] LAB;  Service: Cardiovascular;  Laterality: Right;  arm fistula   PERIPHERAL VASCULAR CATHETERIZATION N/A 06/22/2016   Procedure: Left Arm Venography;  Surgeon: Elam Dutch, MD;  Location: [place] CV LAB;  Service: Cardiovascular;  Laterality: N/A;   PERIPHERAL VASCULAR CATHETERIZATION N/A 06/22/2016   Procedure: A/V Fistulagram - Right Arm;  Surgeon: Elam Dutch, MD;  Location: [place] [facility] LAB;  Service: Cardiovascular;  Laterality: N/A;   PERIPHERAL VASCULAR CATHETERIZATION Right 06/22/2016   Procedure: Peripheral Vascular Balloon Angioplasty;  Surgeon: Elam Dutch, MD;  Location: [place] CV LAB;  Service: Cardiovascular;  Laterality: Right;  arm fistula   POLYPECTOMY  08/22/2020   Procedure: POLYPECTOMY;  Surgeon: Jackquline Denmark, MD;  Location: [facility] ENDOSCOPY;  Service: Endoscopy;;   REVISON OF ARTERIOVENOUS FISTULA Right 03/15/2020   Procedure: RIGHT ARM ARTERIOVENOUS FISTULA REVISON;  Surgeon: [name], MD;  Location: [place];  Service: Vascular;  Laterality: Right;   SPLENECTOMY, TOTAL N/A 08/23/2020   Procedure: SPLENECTOMY;  Surgeon: Greer Pickerel, MD;  Location: [place];  Service: General;  Laterality: N/A;   Social History   Socioeconomic History   Marital status: Single    Spouse name: Not on file   Number of children: Not on file   Years of education: Not on  file   Highest education level: Not on file  Occupational History   Occupation: disabled  Tobacco Use   Smoking status: Former    Packs/day: 1.00    Years: 5.00    Pack years: 5.00    Types: Cigarettes    Quit date: 05/28/1993    Years since quitting: 27.7   Smokeless tobacco: Never  Vaping Use   Vaping Use: Never used  Substance and Sexual Activity   Alcohol use: Not Currently    Comment:  "nothing since 1995"   Drug use: Not Currently    Types: Marijuana, Cocaine    Comment: denies 03/02/19   Sexual activity: Yes  Other Topics Concern   Not on file  Social History Narrative   Not on file   Social Determinants of Health   Financial Resource Strain: Not on file  Food Insecurity: Not on  file  Transportation Needs: Not on file  Physical Activity: Not on file  Stress: Not on file  Social Connections: Not on file   Family History  Problem Relation Age of Onset   Cancer Mother    Diabetes Mother    Hypertension Mother    Deep vein thrombosis Daughter    Allergies  Allergen Reactions   Buprenorphine Nausea Only   Other Other (See Comments)    Allergic to sulfates   Trazodone And Nefazodone Other (See Comments)    Trazodone might have caused hallucinations   Heparin Rash and Other (See Comments)    NO PORK- RELIGIOUS REASONS   Pork-Derived Products Rash and Other (See Comments)    NO PORK- RELIGIOUS REASONS   Sulfa Antibiotics Rash   Prior to Admission medications   Medication Sig Start Date End Date Taking? Authorizing Provider  aspirin EC 81 MG tablet Take 81 mg by mouth at bedtime.     [provider]  atorvastatin (LIPITOR) 40 MG tablet Take 40 mg by mouth daily. 01/20/20   [provider]  AURYXIA 1 GM 210 MG(Fe) tablet Take 420 mg by mouth 3 (three) times daily after meals. 02/12/20   [provider]  calcitRIOL (ROCALTROL) 0.25 MCG capsule Take 0.25 mcg by mouth daily. Patient not taking: No sig reported    [provider]   cinacalcet (SENSIPAR) 30 MG tablet Take 30 mg by mouth daily.    [provider]  hydrALAZINE (APRESOLINE) 25 MG tablet Take 1 tablet (25 mg total) by mouth 3 (three) times daily. Patient not taking: No sig reported 05/07/20   Dana Allan I, MD  isosorbide mononitrate (IMDUR) 30 MG 24 hr tablet Take 30 mg by mouth daily.  08/15/18   [provider]  labetalol (NORMODYNE) 100 MG tablet Take 100 mg by mouth See admin instructions. Take 100 mg by mouth three times on Sun/Tues/Thurs/Sat and two times a day on Mon/Wed/Fri 08/03/20   [provider]  lidocaine-prilocaine (EMLA) cream Apply 1 application topically See admin instructions. Apply small amount to dialysis port (AVF) one hour before dialysis. Cover with occlusive dressing (saran wrap) 11/14/17   [provider]  Melatonin 10 MG TABS Take 10 mg by mouth at bedtime as needed (for sleep).    [provider]  Multiple Vitamins-Minerals (MULTIVITAMIN WITH MINERALS) tablet Take 1 tablet by mouth 2 (two) times daily after a meal.    [provider]  oxycodone (ROXICODONE) 30 MG immediate release tablet Take 30 mg by mouth See admin instructions. Take 30 mg by mouth three times a day on Sun/Tues/Thurs/Sat and two times a day on Mon/Wed/Fri, after dialysis    [provider]  Oxycodone HCl 10 MG TABS Take 10 mg by mouth 2 (two) times daily as needed for moderate pain. 04/29/20   [provider]  OXYGEN Inhale 2 L/min into the lungs continuous.    [provider]  sodium zirconium cyclosilicate (LOKELMA) 5 g packet Take 5 g by mouth daily.    [provider]  Vitamin D, Ergocalciferol, (DRISDOL) 1.25 MG (50000 UNIT) CAPS capsule Take 50,000 Units by mouth every Thursday. 08/03/20   [provider]     Positive ROS: Otherwise negative  All other systems have been reviewed and were otherwise negative with the exception of those mentioned in the HPI and as  above.  Physical Exam: Constitutional: Alert, well-appearing, no acute distress Ears: External ears without lesions or tenderness.  On  examination of the left ear canal I used hydroperoxide and suction to clean the ear canal.  He had just a minimal amount of dried blood in the ear canal.  Again noted with some granulation tissue inferiorly with a small defect in the ear canal but no real bleeding presently and no active drainage noted.  The TM was clear. Nasal: External nose without lesions.. Clear nasal passages Oral: Lips and gums without lesions. Tongue and palate mucosa without lesions. Posterior oropharynx clear. Neck: No palpable adenopathy or masses Respiratory: Breathing comfortably  Skin: No facial/neck lesions or rash noted.  Procedures  Assessment: Bleeding in left ear canal from granulation tissue and slight inflammatory changes after cleaning wax from his ear 10 days ago.  Plan: Prescribed Ciprodex drops 4 to 5 drops in the left ear twice daily as needed pain drainage or bleeding. He will follow-up as needed   Radene Journey, MD

## 2021-05-28 DEATH — deceased
